# Patient Record
Sex: Male | Born: 1935 | Race: White | Hispanic: No | Marital: Married | Smoking: Never smoker
Health system: Southern US, Community
[De-identification: ages and names within clinical notes are randomized; demographics above are authoritative.]

## PROBLEM LIST (undated history)

## (undated) ENCOUNTER — Ambulatory Visit: Admission: EM | Payer: HMO | Source: Home / Self Care

## (undated) DIAGNOSIS — I10 Essential (primary) hypertension: Secondary | ICD-10-CM

## (undated) DIAGNOSIS — M2550 Pain in unspecified joint: Secondary | ICD-10-CM

## (undated) DIAGNOSIS — F419 Anxiety disorder, unspecified: Secondary | ICD-10-CM

## (undated) DIAGNOSIS — K219 Gastro-esophageal reflux disease without esophagitis: Secondary | ICD-10-CM

## (undated) DIAGNOSIS — K5790 Diverticulosis of intestine, part unspecified, without perforation or abscess without bleeding: Secondary | ICD-10-CM

## (undated) DIAGNOSIS — D369 Benign neoplasm, unspecified site: Secondary | ICD-10-CM

## (undated) DIAGNOSIS — N400 Enlarged prostate without lower urinary tract symptoms: Secondary | ICD-10-CM

## (undated) DIAGNOSIS — I2510 Atherosclerotic heart disease of native coronary artery without angina pectoris: Secondary | ICD-10-CM

## (undated) DIAGNOSIS — T4145XA Adverse effect of unspecified anesthetic, initial encounter: Secondary | ICD-10-CM

## (undated) DIAGNOSIS — E039 Hypothyroidism, unspecified: Secondary | ICD-10-CM

## (undated) DIAGNOSIS — I4891 Unspecified atrial fibrillation: Secondary | ICD-10-CM

## (undated) DIAGNOSIS — D126 Benign neoplasm of colon, unspecified: Secondary | ICD-10-CM

## (undated) DIAGNOSIS — R200 Anesthesia of skin: Secondary | ICD-10-CM

## (undated) DIAGNOSIS — E7800 Pure hypercholesterolemia, unspecified: Secondary | ICD-10-CM

## (undated) DIAGNOSIS — T8859XA Other complications of anesthesia, initial encounter: Secondary | ICD-10-CM

## (undated) DIAGNOSIS — M1990 Unspecified osteoarthritis, unspecified site: Secondary | ICD-10-CM

## (undated) DIAGNOSIS — J189 Pneumonia, unspecified organism: Secondary | ICD-10-CM

## (undated) DIAGNOSIS — M2540 Effusion, unspecified joint: Secondary | ICD-10-CM

## (undated) DIAGNOSIS — G5600 Carpal tunnel syndrome, unspecified upper limb: Secondary | ICD-10-CM

## (undated) DIAGNOSIS — R351 Nocturia: Secondary | ICD-10-CM

## (undated) HISTORY — PX: CORONARY ANGIOPLASTY: SHX604

## (undated) HISTORY — DX: Atherosclerotic heart disease of native coronary artery without angina pectoris: I25.10

## (undated) HISTORY — PX: OTHER SURGICAL HISTORY: SHX169

## (undated) HISTORY — PX: CATARACT EXTRACTION: SUR2

## (undated) HISTORY — DX: Essential (primary) hypertension: I10

## (undated) HISTORY — DX: Diverticulosis of intestine, part unspecified, without perforation or abscess without bleeding: K57.90

## (undated) HISTORY — DX: Pure hypercholesterolemia, unspecified: E78.00

## (undated) HISTORY — DX: Gastro-esophageal reflux disease without esophagitis: K21.9

## (undated) HISTORY — DX: Benign neoplasm of colon, unspecified: D12.6

## (undated) HISTORY — DX: Benign neoplasm, unspecified site: D36.9

## (undated) HISTORY — DX: Benign prostatic hyperplasia without lower urinary tract symptoms: N40.0

## (undated) HISTORY — DX: Hypothyroidism, unspecified: E03.9

## (undated) SURGERY — ECHOCARDIOGRAM, TRANSESOPHAGEAL
Anesthesia: Moderate Sedation

---

## 1999-04-08 DIAGNOSIS — D126 Benign neoplasm of colon, unspecified: Secondary | ICD-10-CM

## 1999-04-08 DIAGNOSIS — D369 Benign neoplasm, unspecified site: Secondary | ICD-10-CM

## 1999-04-08 HISTORY — DX: Benign neoplasm of colon, unspecified: D12.6

## 1999-04-08 HISTORY — DX: Benign neoplasm, unspecified site: D36.9

## 2000-01-15 ENCOUNTER — Encounter (INDEPENDENT_AMBULATORY_CARE_PROVIDER_SITE_OTHER): Payer: Self-pay

## 2000-01-15 ENCOUNTER — Other Ambulatory Visit: Admission: RE | Admit: 2000-01-15 | Discharge: 2000-01-15 | Payer: Self-pay | Admitting: Urology

## 2005-04-07 DIAGNOSIS — K5790 Diverticulosis of intestine, part unspecified, without perforation or abscess without bleeding: Secondary | ICD-10-CM

## 2005-04-07 HISTORY — PX: COLONOSCOPY: SHX174

## 2005-04-07 HISTORY — DX: Diverticulosis of intestine, part unspecified, without perforation or abscess without bleeding: K57.90

## 2005-06-04 ENCOUNTER — Ambulatory Visit (HOSPITAL_COMMUNITY): Admission: RE | Admit: 2005-06-04 | Discharge: 2005-06-04 | Payer: Self-pay | Admitting: *Deleted

## 2005-12-22 ENCOUNTER — Ambulatory Visit: Payer: Self-pay | Admitting: Internal Medicine

## 2005-12-22 ENCOUNTER — Ambulatory Visit (HOSPITAL_COMMUNITY): Admission: RE | Admit: 2005-12-22 | Discharge: 2005-12-22 | Payer: Self-pay | Admitting: Internal Medicine

## 2006-12-24 ENCOUNTER — Ambulatory Visit (HOSPITAL_COMMUNITY): Admission: RE | Admit: 2006-12-24 | Discharge: 2006-12-24 | Payer: Self-pay | Admitting: Ophthalmology

## 2007-01-14 ENCOUNTER — Ambulatory Visit (HOSPITAL_COMMUNITY): Admission: RE | Admit: 2007-01-14 | Discharge: 2007-01-14 | Payer: Self-pay | Admitting: Ophthalmology

## 2007-04-08 DIAGNOSIS — I2510 Atherosclerotic heart disease of native coronary artery without angina pectoris: Secondary | ICD-10-CM

## 2007-04-08 HISTORY — PX: HERNIA REPAIR: SHX51

## 2007-04-08 HISTORY — DX: Atherosclerotic heart disease of native coronary artery without angina pectoris: I25.10

## 2007-06-07 ENCOUNTER — Encounter (INDEPENDENT_AMBULATORY_CARE_PROVIDER_SITE_OTHER): Payer: Self-pay | Admitting: Internal Medicine

## 2007-06-07 ENCOUNTER — Observation Stay (HOSPITAL_COMMUNITY): Admission: EM | Admit: 2007-06-07 | Discharge: 2007-06-08 | Payer: Self-pay | Admitting: Emergency Medicine

## 2007-06-08 ENCOUNTER — Encounter (INDEPENDENT_AMBULATORY_CARE_PROVIDER_SITE_OTHER): Payer: Self-pay | Admitting: *Deleted

## 2007-06-11 ENCOUNTER — Inpatient Hospital Stay (HOSPITAL_COMMUNITY): Admission: EM | Admit: 2007-06-11 | Discharge: 2007-06-13 | Payer: Self-pay | Admitting: Emergency Medicine

## 2007-06-11 HISTORY — PX: CARDIAC CATHETERIZATION: SHX172

## 2007-06-19 ENCOUNTER — Inpatient Hospital Stay (HOSPITAL_COMMUNITY): Admission: EM | Admit: 2007-06-19 | Discharge: 2007-06-22 | Payer: Self-pay | Admitting: Emergency Medicine

## 2007-06-21 HISTORY — PX: CARDIAC CATHETERIZATION: SHX172

## 2007-06-28 ENCOUNTER — Emergency Department (HOSPITAL_COMMUNITY): Admission: EM | Admit: 2007-06-28 | Discharge: 2007-06-28 | Payer: Self-pay | Admitting: Emergency Medicine

## 2007-06-29 ENCOUNTER — Emergency Department (HOSPITAL_COMMUNITY): Admission: EM | Admit: 2007-06-29 | Discharge: 2007-06-29 | Payer: Self-pay | Admitting: Emergency Medicine

## 2007-07-01 ENCOUNTER — Encounter (HOSPITAL_COMMUNITY): Admission: RE | Admit: 2007-07-01 | Discharge: 2007-07-31 | Payer: Self-pay | Admitting: *Deleted

## 2007-07-08 ENCOUNTER — Ambulatory Visit: Payer: Self-pay | Admitting: Internal Medicine

## 2007-08-02 ENCOUNTER — Ambulatory Visit (HOSPITAL_COMMUNITY): Admission: RE | Admit: 2007-08-02 | Discharge: 2007-08-02 | Payer: Self-pay | Admitting: Internal Medicine

## 2007-08-02 ENCOUNTER — Encounter (HOSPITAL_COMMUNITY): Admission: RE | Admit: 2007-08-02 | Discharge: 2007-09-01 | Payer: Self-pay | Admitting: *Deleted

## 2007-08-02 ENCOUNTER — Ambulatory Visit: Payer: Self-pay | Admitting: Internal Medicine

## 2007-08-03 ENCOUNTER — Ambulatory Visit (HOSPITAL_COMMUNITY): Admission: RE | Admit: 2007-08-03 | Discharge: 2007-08-03 | Payer: Self-pay | Admitting: Internal Medicine

## 2007-09-03 ENCOUNTER — Encounter (HOSPITAL_COMMUNITY): Admission: RE | Admit: 2007-09-03 | Discharge: 2007-10-03 | Payer: Self-pay | Admitting: *Deleted

## 2007-10-04 ENCOUNTER — Encounter (HOSPITAL_COMMUNITY): Admission: RE | Admit: 2007-10-04 | Discharge: 2007-11-03 | Payer: Self-pay | Admitting: *Deleted

## 2007-10-11 ENCOUNTER — Ambulatory Visit: Payer: Self-pay | Admitting: Gastroenterology

## 2008-03-20 ENCOUNTER — Encounter: Payer: Self-pay | Admitting: Urgent Care

## 2008-03-20 ENCOUNTER — Ambulatory Visit: Payer: Self-pay | Admitting: Internal Medicine

## 2008-03-20 LAB — CONVERTED CEMR LAB
BUN: 23 mg/dL (ref 6–23)
Basophils Absolute: 0 10*3/uL (ref 0.0–0.1)
Calcium: 8.9 mg/dL (ref 8.4–10.5)
Chloride: 108 meq/L (ref 96–112)
Creatinine, Ser: 1 mg/dL (ref 0.40–1.50)
Glucose, Bld: 87 mg/dL (ref 70–99)
HCT: 40.4 % (ref 39.0–52.0)
Lymphs Abs: 1.9 10*3/uL (ref 0.7–4.0)
Neutrophils Relative %: 57 % (ref 43–77)
Platelets: 192 10*3/uL (ref 150–400)
Potassium: 4.4 meq/L (ref 3.5–5.3)
RBC: 4.37 M/uL (ref 4.22–5.81)
RDW: 13.7 % (ref 11.5–15.5)

## 2008-03-22 ENCOUNTER — Ambulatory Visit (HOSPITAL_COMMUNITY): Admission: RE | Admit: 2008-03-22 | Discharge: 2008-03-22 | Payer: Self-pay | Admitting: Internal Medicine

## 2009-07-13 ENCOUNTER — Ambulatory Visit (HOSPITAL_COMMUNITY): Admission: RE | Admit: 2009-07-13 | Discharge: 2009-07-13 | Payer: Self-pay | Admitting: General Surgery

## 2010-06-26 LAB — BASIC METABOLIC PANEL
BUN: 15 mg/dL (ref 6–23)
Creatinine, Ser: 0.95 mg/dL (ref 0.4–1.5)
GFR calc non Af Amer: >60 mL/min (ref >60)
Sodium: 139 mEq/L (ref 135–145)

## 2010-06-26 LAB — CBC
Platelets: 181 10*3/uL (ref 150–400)
RBC: 4.11 MIL/uL — ABNORMAL LOW (ref 4.22–5.81)
WBC: 5.9 10*3/uL (ref 4.0–10.5)

## 2010-08-20 NOTE — Cardiovascular Report (Signed)
NAMECORRELL, DENBOW NO.:  [id number]   MEDICAL RECORD NO.:  [id number]          PATIENT TYPE:  INP   LOCATION:  6531                         FACILITY:  [facility]   PHYSICIAN:  Madaline Savage, M.D.DATE OF BIRTH:  10/08/1931   DATE OF PROCEDURE:  06/21/2007  DATE OF DISCHARGE:                            CARDIAC CATHETERIZATION   PROCEDURES PERFORMED:  1. Selective coronary angiography by Judkins technique.  2. Retrograde left heart catheterization.  3. Left ventricular angiography.  4. Direct coronary artery stenting of the mid first obtuse marginal      branch of the left circumflex coronary artery.   COMPLICATIONS:  None.   ENTRY SITE:  Right femoral.   DYE USED:  Omnipaque.   MEDICATIONS GIVEN:  Angiograms during coronary intervention which was  discontinued after the case.   PATIENT PROFILE:  The patient is a 77 year old gentleman who had  undergone stenting of his mid left anterior descending coronary artery  and ostium of the diagonal on June 11, 2007.  He was discharged on June 13, 2007.  Since that time, he has felt poorly and lacked energy and had  fatigue and had intermittent chest pain.  It occurs with any activity,  occurs when walking through his house and it occurs while he is watching  TV or at rest.  His activity has been limited because of that and the  patient presented to [facility] again on June 19, 2007 and was  admitted because of his symptoms.  His ejection fraction at that time  was 50%.  Today's procedure was performed after the patient has proven  to have negative cardiac enzymes, and it was performed from the left  percutaneous femoral approach since the recent cath and intervention was  performed from the right femoral approach.   RESULTS:  Pressures:  The left ventricular pressure was 138/40 and  diastolic pressure 12.  Central aortic pressure 130/70 with a mean of  95.  No significant aortic valve gradient by  pullback technique.   ANGIOGRAPHIC RESULTS:  The patient's left main coronary artery was  normal.   The left anterior descending coronary artery courses the cardiac apex  and just after septal perforator branch #2, there is a radio-opaque  stent that is pristine in appearance, beautifully deployed against the  mid-LAD vessel wall.  The distal vessel is free of disease that wraps  around the LAD and the LAD appears pristine as does the stent which it  contains.   The diagonal branch off the LAD underwent balloon angioplasty without  stenting and it appears pristine as well with no sign of an ostial  stenosis, and this vessel appears normal distally.   The circumflex coronary artery is a nondominant vessel with a very large  bifurcating first obtuse marginal branch which courses almost to the  cardiac apex.  It bifurcates just beyond its origin from the proximal  circumflex.  There is a stenosis of what appears to be 75% just after  this obtuse marginal bifurcates into a small limb and a large limb.  The  large limb of the obtuse marginal branch  contains a 75% stenosis in two  views.  The stenosis did not resolve after intracoronary nitroglycerin  200 mcg was delivered.   The ongoing circumflex appears normal.   The right coronary artery shows a 30-40% area of eccentric stenosis in  the distal RCA well before the trifurcation point of a posterior  descending posterolateral and accessory PDA is seen.  This 30-40%  narrowing is not felt to be significant.   Left ventricle shows vigorous contractility of all wall segments.  I  estimated ejection fraction of 50%.   Percutaneous intervention on the circumflex lesion was accomplished  without any complication and was with a satisfactory result.  A 6-French  left Judkins four guide catheter was used.  A Prowater wire was used,  and a no predilatation balloon was necessary.  After another dose of  intracoronary nitroglycerin 200 mcg, I  deployed a Cypher stent 2.5 x 13  mm in the larger limb of the bifurcating obtuse marginal branch and once  I located it strategically, I deployed the stent there to inflation  pressures exceeding 12 atmospheres.  Following deployment of the stents,  I postdilated with a dura star balloon 2.75 x 10 and achieved the  desirable results, and that was a pristine appearance to the OM #1  beyond its smaller branch and there was no encroachment of the stent on  the side branch.  There was TIMI III flow before and after that  procedure.   The patient received Angiomax during the procedure.  The ACT was in  excess of 300 seconds, and we turned off the Angiomax at completion of  the case.   FINAL IMPRESSIONS:  1. Chest pain persist after recent LAD and diagonal intervention on      June 11, 2007.  2. Culprit vessel considered to be obtuse marginal branch #1 just      beyond the bifurcation point and a large limb of the OM.  3. No complications during the procedure.   PLAN:  The patient will be assessed for possible discharge tomorrow and  follow-up with Dr. Jenne Campus and Dr. Domingo Sep.           ______________________________  Madaline Savage, M.D.     WHG/MEDQ  D:  06/21/2007  T:  06/21/2007  Job:  [id number]   cc:   Dani Gobble, MD  Darlin Priestly, MD  [facility]

## 2010-08-20 NOTE — Cardiovascular Report (Signed)
NAMEZORION, NIMS NO.:  [id number]   MEDICAL RECORD NO.:  [id number]          PATIENT TYPE:  INP   LOCATION:  3703                         FACILITY:  [facility]   PHYSICIAN:  Darlin Priestly, MD  DATE OF BIRTH:  [birth year]   DATE OF PROCEDURE:  06/11/2007  DATE OF DISCHARGE:  06/13/2007                            CARDIAC CATHETERIZATION   PROCEDURE:  1. Left heart catheterization.  2. Coronary angiography.  3. Left ventriculogram.  4. Left anterior descending, mid.      a.     Percutaneous transluminal coronary balloon angioplasty.      b.     Placement of intracoronary stent.  5. Diagonal-ostial.  Percutaneous transluminal balloon angioplasty..   COMPLICATIONS:  None.   INDICATIONS:  Mr. Teegarden is a 77 year old male patient of Dr. Kem Boroughs with a history of hypothyroidism, benign prostatic hypertrophy  who recently was admitted to [facility] for chest pain thought  to be secondary to esophageal reflux.  He underwent a stress Cardiolite  suggesting anteroseptal and inferoseptal ischemia with normal EF.  He  was seen by Dr. Domingo Sep on June 11, 2007, with nitroglycerin responsive  angina.  He is now referred for cardiac catheterization to assess  coronary anatomy.   DESCRIPTION OF OPERATION:  After obtaining informed consent, the patient  was to the cardiac catheterization lab.  Right groin was shaved, prepped  and draped in the usual sterile fashion.  ECG monitoring established.  Using modified Seldinger technique, a #6-French arterial sheath was  inserted in the right femoral artery.  A 6-French diagnostic catheter  was used to perform diagnostic angiography.   Left main is a large vessel with no disease.   LAD is a medium to large vessel which is subtotally occluded after the  takeoff of the first diagonal.  There is TIMI-1 flow into the mid and  distal LAD.   There is diffuse 60% disease prior to the subtotal occlusion.   First diagonal is a medium vessel which bifurcates in its distal segment  with no significant disease.   Left circumflex is a medium-size vessel coursing in the AV groove and  gives rise to two obtuse marginal branches.  The AV groove circumflex  has no disease.   First OM is a medium-size vessel which bifurcates in its mid segment  with a 60% lesion in the proximal portion of the OM.   Second OM is a small vessel with no significant disease.   The right coronary artery is a large vessel which is dominant and gives  rise to both a PDA as well as posterolateral branch.  There is a 40%  proximal and 60% distal stenoses.  PDA and posterolateral branch have no  significant disease.   Left ventriculogram reveals a preserved EF of 50%.   HEMODYNAMIC RESULTS:  Systemic arterial pressure 133/65, LV system  pressure 133/6, LVEDP of 14.   INTERVENTIONAL PROCEDURE:  LAD-mid:  Following diagnostic angiography, a  #6-French JL-4 guiding catheter was then coaxially engaged in the left  coronary ostium.  Next, a 0.014 Forte marker wire was advanced down the  guiding catheter and then used to cross the subtotally occluded LAD and  positioned the apical LAD without difficulty.  A second 0.014 high-  torque floppy guidewire was then positioned in the distal first  diagonal.  Over the LAD guidewire, a 2.5 x 15 mm Fire Star balloon was  then used to cross the subtotal occlusion.  Two inflations to a maximum  of 14 atmospheres performed for a total 1 minute 6 seconds.  Followup  angiogram revealed excellent luminal gain.  This balloon was then  removed and a Cypher 3.0 x 23 mm stent was then positioned across the  stenotic lesion. It should be noted this was extended across the  diagonal branch as the LAD appeared to have disease prior to the takeoff  of the diagonal.  This stent was then deployed to maximum of 10  atmospheres for a total of 20 seconds.  A second inflation at 12  atmospheres  performed for total of 20 seconds.  Followup angiogram  revealed excellent luminal gain, though there was some mild haziness in  the distal portion of the stent with TIMI III flow to the distal vessel.  We then attempted to take a second Cypher 2.5 x 13 mm stent into the  midportion of the overlap distal segments so this would not turn beyond  the proximal segment of the vessel.  At this point it was also noted  that the diagonal wire was in the process of working out of the  diagonal.  We were unable to advance the wire as it was trapped behind  the LAD stent.  We ultimately did just pull the wire back into the LAD  and then rewired the diagonal without difficulty.  We then took a  DuraStar 3.0 x 10 mm balloon into the distal portion of the stent, and  four overlapping inflations to maximum of 14 atmospheres performed for a  total of approximately 1 minute 56 seconds.  Followup angiogram revealed  excellent luminal gain with no dissection or thrombus, though there was  noted to be some compromise of the ostial portion of the diagonal.  At  this point, we took the 2.5 x 13 mm Cypher stent and overlapped the  distal segments.  The stent was deployed to 12 atmospheres for a total  of 31 seconds.  A second inflation to 16 atmospheres performed for a  total of 17 seconds.  The balloon was then pulled back over the  overlapping segment and was again inflated to 18 atmospheres for a total  of 15 seconds.  The DuraStar balloon was then placed back into the LAD,  and overlapping inflations through the mid and proximal portion of 2.5  stent to a maximum 12 atmospheres performed for total of approximately  38 seconds.  The DuraStar balloon was then removed, and a Fire Star 2.5  x 15 mm balloon was then tracked into the ostial portion of the  diagonal.  Two inflations at a maximum of 8 atmospheres for a total of  approximately 34 seconds.  Followup angiogram revealed excellent luminal  gain with TIMI  III flow in the LAD and the diagonal.  The DuraStar was  then placed back in the LAD, and two additional inflations to 18  atmospheres performed throughout the mid and proximal LAD stent.  The  balloon was then removed.  Followup angiogram revealed excellent luminal  gain with TIMI III flow in both the diagonal as well as the LAD.  IV  Integralin was used throughout the case.  Intervenous dose of heparin  given to maintain ACT between 200 and 300.   Final orthogonal angiograms revealed less than 10% residual stenosis in  the LAD and diagonal with TIMI III flow to both vessels.  At this point,  we elected to conclude the procedure.  All balloons, wires, and  catheters removed.  Hemostatic sheath was sewn in place, and the patient  was transferred back to the ward in stable condition.   CONCLUSIONS:  1. Successful percutaneous coronary transluminal balloon angioplasty      and placement of overlapping Cypher      stents (2.5 x 13, 3.0 x 23) in the mid LAD stenotic lesion,      ultimately postdilated to 3 mm.  2. Successful percutaneous transluminal coronary balloon angioplasty      to the ostial diagonal.  3 . Adjuvant use of Integrilin infusion.      Darlin Priestly, MD  Electronically Signed     RHM/MEDQ  D:  06/11/2007  T:  06/13/2007  Job:  [id number]   cc:   Dani Gobble, MD  Sam[facility] Jester

## 2010-08-20 NOTE — H&P (Signed)
NAME:  Jerry Cain, CELESTIN NO.:  [id number]   MEDICAL RECORD NO.:  [id number]          PATIENT TYPE:  INP   LOCATION:  4703                         FACILITY:  [facility]   PHYSICIAN:  Dani Gobble, MD       DATE OF BIRTH:  [birth year]   DATE OF ADMISSION:  06/11/2007  DATE OF DISCHARGE:                              HISTORY & PHYSICAL   REFERRING:  Dr. Charm Barges.   INDICATION:  Chest pain and high-risk Myoview.   HISTORY OF PRESENT ILLNESS:  Ms. Ashkar is a very pleasant 77-year-  old gentleman with a past medical history of hypothyroidism and BPH who  was recently discharged from [facility] after an admission for  chest discomfort.  This chest discomfort began within the past one week  or so.  It seemed to occur when he was watching television after he ate  lunch.  It was located to the lower right and left chest.  There was no  radiation, nor did he appear to be short of breath, nauseated, or  diaphoretic.  He did experience palpitations; however, he has had a long  history of palpitations.  The duration of the discomfort was one to two  hours with spontaneous resolution, and he did report that after he  belched it seemed to improve and later resolved entirely after he had a  Pepsi and a snack.   He has had a three- to four-month history of increased belching and  passing gas and felt that his chest discomfort was likely indigestion or  reflux disease.  He is quite active and swims one to two miles daily  without symptoms or difficulty.  He did have recent cataract surgery in  the fall of 2008 and was unable to swim for approximately two months.  Cardiac risk factors are negative for hypertension and diabetes.  His  lipid status is currently unknown.  Heart disease does run in his  family, but he is a nonsmoker.   He ruled out for myocardial infarction by enzymes.  However, he did have  an abnormal EKG with ischemic appearing T-waves in the anterior leads.  His lab work during that hospitalization was reasonably unremarkable.  CBC was essentially normal.  His electrolytes were normal.  Glucose  mildly elevated at 111, BUN 14, creatinine 1.04.  Liver function tests  were normal.  Amylase, lipase normal.  INR 1.0.  Actually, he had an  elevated MB and relative index of 4.9 and 2.8, respectively.  However,  troponin I's were all negative, and as noted above, the ST segments in  the anteroseptal leads were a bit ischemic appearing, and the  possibility of a prior anteroseptal MI cannot be excluded.  Although he  did not carry a diagnosis of hypertension, he was hypertensive  throughout his stay, and I started him on ACE inhibitor, and I started  him on a statin empirically.  I referred him for prompt stress Myoview  which was performed the day after he was discharge.  This came back as a  high-risk scan.  These revealed moderate to severe ischemia in the  anterior septal  and anterior apical walls and the same could be that of  the inferior walls as well.  EF was calculated 51%, and it was felt that  global left systolic function was mildly reduced.   He is here in the office today for follow-up of his high-risk scan.  I  discussed the findings at length.  Initially, I had planned to schedule  him for a cardiac catheterization within the next several days; however,  today he reports he has had intermittent chest discomfort since he left  the hospital which again he thought was indigestion.  Given his high  risk scan and ongoing chest discomfort, I would recommend prompt  hospitalization with cardiac catheterization.  I discussed the logistics  of the procedure as well as risk and benefits.  Risks include but are  limited to bleeding, infection, nerve or vessel damage, emergent  surgery, allergic reactions, arrhythmia, kidney damage, heart attack,  stroke, and death.  He appears amendable to proceed.   ALLERGIES:  TO PENICILLIN.   CURRENT  MEDICATIONS:  He did not bring them today, but putting it  together from my consult note in the hospital, I sent him out on  sublingual nitroglycerin which he has not tried as yet, lisinopril 20  daily, aspirin 81 daily, and he is on a statin, but he again did not  bring his list.   SOCIAL HISTORY:  He is married.  Denies alcohol, tobacco or illicit drug  use.  He swims one to two miles daily.   FAMILY HISTORY:  Notable for brother who had open heart surgery at the  age of 56.   REVIEW OF SYSTEMS:  GENERAL:  Negative other than as noted above.  EYES:  He has had recent cataract surgery.  ENT:  Negative.  RESPIRATORY AND  CARDIOVASCULAR:  As outlined above.  GASTROINTESTINAL:  Negative.  GENITOURINARY:  Negative other than BPH.  MUSCULOSKELETAL:  He has some  arthritis in his knees.  NEUROLOGICAL:  Negative.  PSYCHIATRIC:  Negative.  SKIN:  Negative.  ENDOCRINE:  Negative.   PHYSICAL EXAMINATION:  Reveals a very pleasant, elderly, white male in  no acute distress, alert and oriented x3.  Height is 6 feet tall, weighs  211 pounds.  Blood pressure much improved at 130/80, down from 167  systolic when he was in the hospital.  Heart rate 58.  NECK:  Is negative for JVD at 90 degrees.  I do not appreciate carotid  bruits, lymphadenopathy, or thyromegaly.  LUNGS:  Are clear.  CARDIAC EXAM:  Reveals a regular rate and rhythm.  ABDOMEN:  Soft, nontender, nondistended with positive bowel sounds.  Carotid upstroke is normal.  LOWER EXTREMITIES:  Negative for edema with intact distal pulses which  are equal bilaterally.   EKG performed in the office today does not appear significantly changed  from his prior EKG.  Ischemic-appearing T-waves in V3 and V4.   IMPRESSION:  1. Ongoing chest discomfort.  2. Abnormal EKG suggestive of ischemic-appearing T-waves in the      anteroseptal region.  3. Hypothyroidism on replacement, although he does not show that on      his current medical regimen;  however, he did not bring his      medications in today.  4. Benign prostatic hypertrophy.  5. Prior colonic polyps.  6. Cataract surgery in the fall of 2008.   RECOMMENDATIONS:  1. Admission to [facility] on a rule out MI protocol.  2. Catheterization today if possible  and if not optimize medications      and catheterization him on Monday.  3. Lab work on admission.  4. Chest x-ray from his recent hospitalization revealed mild      cardiomegaly without active lung disease.  5. Serial enzymes.  6. Maintain accurate medical regimen that he is currently.           ______________________________  Dani Gobble, MD     AB/MEDQ  D:  06/11/2007  T:  06/11/2007  Job:  [id number]   cc:   Samuel Jester

## 2010-08-20 NOTE — H&P (Signed)
NAME:  Jerry Cain, AUSBORN NO.:  [id number]   MEDICAL RECORD NO.:  [id number]          PATIENT TYPE:  OBV   LOCATION:  A330                          FACILITY:  [facility]   PHYSICIAN:  Osvaldo Shipper, MD     DATE OF BIRTH:  [birth year]   DATE OF ADMISSION:  06/07/2007  DATE OF DISCHARGE:  LH                              HISTORY & PHYSICAL   PRIMARY CARE DOCTOR:  Samuel Jester.   ADMITTING DIAGNOSES:  1. Chest pain.  2. Palpitations.   CHIEF COMPLAINT:  Chest pain and palpitations since yesterday.   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  The pain was about 3-4/10.  He says that he felt  like he had to burp.  He also admits to having heartburn and a lot of  burping associated with this pain.  Then about an hour later the pain  went away.  And then yesterday he was sitting watching TV when he  experienced similar pain with no fever, no diaphoresis, no shortness of  breath.  Again he felt like he had to burp, then he ate supper and had a  Diet Pepsi and the pain went away.  He denies any fever.  He said he has  been having cough with wheezing, he had this actually a few days ago  with whitish expectoration but none currently.  He says compared to  about a year ago he feels that he is really short of breath.  He admits  to having a stress test about 10 years ago for palpitations which was  negative.  This morning when he woke up he checked his pulse and felt  like he was having skipped beats.  He did not have any more episodes of  chest pain and because of all of this that has been happening for the  past 10 days he decided to come in to the hospital to get  himself  examined.   MEDICATIONS AT HOME:  He is on a medication for prostate and a  medication for thyroid and he is also on Metamucil.  He does not know  the names of his medications.   ALLERGIES:  PENICILLIN.   PAST MEDICAL HISTORY:  1. Hypothyroidism.  2. Possible BPH.  3. He has had colonic polyps in the past.  4. He denies any other surgeries.  5. He has had broken ribs and broken vertebrae as a result of injury      but no other surgeries and no other medical problems.   SOCIAL HISTORY:  1. Lives about 6 miles from [place] with his wife.  2. He states he swims on a daily basis 1-2 miles and does not      experience any kind of chest pain with this activity.  3. No smoking  use, alcohol use or illicit drug use.   FAMILY HISTORY:  He had a brother who had open heart surgery at the age  of 38.   REVIEW OF SYSTEMS:  The 10-point review of systems was negative except  as noted in the HPI.   PHYSICAL EXAMINATION:  VITAL SIGNS:  Temperature 97.5, blood pressure  when he came was 157/79, going up to 176/80 and then subsequently  dropping down to 155/70s, respiratory rate 18, saturations 98% on room  air.  GENERAL EXAM:  This is a well-developed, well-nourished, male in no  distress.  HEENT:  There is no pallor, no icterus.  Oral mucous membrane is moist,  no oral lesions are noted.  NECK:  Soft and supple, no thyromegaly is appreciated.  LUNGS:  Clear to auscultation bilaterally.  CARDIOVASCULAR:  S1 S2 is normal, regular, no murmurs appreciated, no S3  S4, no bruits are heard.  ABDOMEN:  Soft, nontender, nondistended, bowel sounds are present, no  mass or organomegaly is appreciated.  EXTREMITIES:  Show no edema, peripheral pulses are palpable.  NEUROLOGIC:  Patient is alert, oriented x3, no focal neurological  deficits are present.   LAB DATA:  His CBC is unremarkable as is his BMET.  First set of cardiac  markers are negative.  He had an EKG done which showed a  sinus rhythm  with possibly mild left axis deviation, intervals maybe, mild first  degree AV block.  Other intervals are within normal range.  He has  possible Q wave in lead 3 though very difficult to say.  He has some  biphasic T waves but no other acute ST-T changes are noted.  He had a  chest x-ray which showed mild cardiac enlargement, otherwise no other  acute abnormalities appreciated.   ASSESSMENT:  This is a 77 year old white male who has a history of  possibly hypothyroidism and benign prostatic hypertrophy who presents  with atypical chest pain and skipped beats.  This is an individual who  swims up to 2 miles every day and he does that in a 2 hour period  without taking any breaks.  It is quite unlikely that this patient has  coronary artery disease.  I suspect this patient has acid reflux  disease.  Some of his other symptoms could suggest gallbladder disease  as well.  Dr. Neale Burly discussed this case with Dr. Daleen Squibb, [facility]  cardiologist, who recommended admission here.   PLAN:  Chest pain, palpitations.  We will observe him in the hospital  and rule him out for acute coronary syndrome, repeat electrocardiograms,  check a lipid profile, check his TSH and free T4, make sure that is not  what is causing his symptoms.  Patient will be seen tomorrow morning by  [facility] and further disposition can be planned.  A 2D  echocardiogram will be obtained because he has got cardiomegaly on chest  x-ray.   This patient is a low risk for coronary artery disease at this point.   I anticipate patient going home by tomorrow evening.      Osvaldo Shipper, MD  Electronically Signed     GK/MEDQ  D:  06/07/2007  T:  06/07/2007  Job:  [id number]   cc:   Samuel Jester  Fax: [phone]

## 2010-08-20 NOTE — Assessment & Plan Note (Signed)
NAME:  Jerry Cain, Jerry Cain           CHART#:  [id number]   DATE:  10/11/2007                       DOB:  [birth year]   CHIEF COMPLAINT:  Followup GERD/atypical chest pain.   PROBLEM LIST:  1. Atypical chest pain, refractory heartburn and indigestion.      Underwent EGD, August 02, 2007, by Dr. Jena Gauss.  He was found to have      a normal exam.  He did respond symptomatically to b.i.d. omeprazole      initially.  2. Hypertension.  3. Hypercholesterolemia.  4. Hypothyroidism.  5. Gout.  6. Benign prostatic hypertrophy.  7. Coronary artery disease, status post cardiac catheterization and      percutaneous transluminal coronary angioplasty by Dr. Shirlee Latch on      June 21, 2007.  8. Bilateral cataract surgery.  9. Colonoscopy on December 22, 2005.  10.Extensive left-sided diverticulosis.  11.Tubular adenoma, colonoscopy in 2001.  12.He had a normal upper abdominal ultrasound on August 03, 2007.   SUBJECTIVE:  The patient is a 77 year old Caucasian male who recently  underwent EGD as described above by Dr. Jena Gauss.  He has intentionally  lost 16 pounds and is on a cardiac diet and doing very well.  He does  complain of some postprandial bloating, especially when he eats foods,  such as pintos, turnips, and drinks significant amount of tea.  He has  some intermittent left lower quadrant pain and bloating, as well as  increased belching.  He denies any heartburn or indigestion at this  time.  He is not having any constipation, diarrhea, or blood in his  stools.  He is on omeprazole 20 mg b.i.d., and does not see a difference  in his symptoms.   CURRENT MEDICATIONS:  See the list from October 11, 2007.   ALLERGIES:  Penicillin.   OBJECTIVE:  VITAL SIGNS:  Weight 190 pounds, height 72 inches,  temperature 97.9, blood pressure 124/68, and pulse 60.  GENERAL:  He is a well-developed, well-nourished elderly Caucasian male,  in no acute distress.  HEENT:  Sclerae clear.  Nonicteric.  Conjunctivae  pink.  Oropharynx pink  and moist without any lesions.  HEART:  Regular rate and rhythm.  Normal S1 and S2.  ABDOMEN:  Positive bowel sounds x4.  No bruits auscultated.  Soft and  nontender.  Mildly distended without palpable mass or  hepatosplenomegaly.  No rebound, tenderness, or guarding.  EXTREMITIES:  Without clubbing or edema.   ASSESSMENT:  The patient is a 77 year old Caucasian male with  gastroesophageal reflux disease, well controlled on proton pump  inhibitor except for increased belching and abdominal bloating.  He does  eat a significant amount of gas producing foods.  He has had an  intentional 16-pound weight loss, which would otherwise be concerning.   PLAN:  1. Gas literature and GERD literature given for his review.  2. He was instructed to avoid gas producing foods.  3. Discontinue omeprazole and change to Aciphex 20 mg daily.  I have      given him 4 boxes of samples as well as a prescription for 31 with      5 refills.  4. Trial of Beano.  We have given him samples.  5. Six months with Dr. Jena Gauss or sooner if gas/bloating continues, he      may need further work-up.  Lorenza Burton, N.P.  Electronically Signed     Kassie Mends, M.D.  Electronically Signed    KJ/MEDQ  D:  10/11/2007  T:  10/12/2007  Job:  [id number]   cc:   Samuel Jester

## 2010-08-20 NOTE — Consult Note (Signed)
NAME:  BAUER, AUSBORN NO.:  [id number]   MEDICAL RECORD NO.:  [id number]          PATIENT TYPE:  OBV   LOCATION:  A330                          FACILITY:  [facility]   PHYSICIAN:  Dani Gobble, MD       DATE OF BIRTH:  [birth year]   DATE OF CONSULTATION:  DATE OF DISCHARGE:                                 CONSULTATION   PRIMARY CARE PHYSICIAN:  Samuel Jester.   CHIEF COMPLAINT:  His chest pain.   HISTORY OF PRESENT ILLNESS:  Mr. Getter is a very pleasant 77-year-  old gentleman with a past medical history of hypothyroidism and BPH, who  was referred for chest discomfort.  He reports the chest discomfort  began this past weekend.  He was watching TV after he had lunch was on  Sunday, and he began to experience chest discomfort which she locates to  the lower left and right chest.  There was no radiation with this  discomfort.  He did not appear to be excessively with shortness of  breath, nausea, vomiting or diaphoresis.  He did experience some  palpitations.  However, he has had a long history of palpitations.  This  duration was approximately 1 to 2 hours.  It resolved spontaneously.  He  did report that after he belched, it seemed to improve and later  resolved after he had a Pepsi and a snack.   He reports he has had a 3 to 24-month history of increased belching and  passing gas.  He denies specifically chest discomfort associated with  this, but he does point to his chest, referring to some sensation along  with the belching which resolved.   He reports he had palpitations and had a stress test for this  approximately 10 years ago, remotely.  He reports occasional  palpitations, particularly if he checks his pulse, but they are entirely  asymptomatic and describes them as skipping beats.  He denies  shortness of breath or dyspnea on exertion.  He denies dizziness, pre-  syncope, or syncope.  He denies lower extremity edema, PND or orthopnea.  He does snore  but has never had a history of apneic episodes, nor does  he give a history of early a.m. headaches or daytime somnolence.   He reports that he swims approximately one to two miles a day.  He had  cataract surgery in the fall of this year and was unable to swim for not  quite two months and it did take him a while to get back up to his  fitness level, but other than that there had been no change in activity  tolerance.  His cardiac risk factors are negative for hypertension,  diabetes.  His lipid status is unknown.  Heart disease does run in his  family.  He is a nonsmoker.   PAST MEDICAL HISTORY:  1. Hypothyroidism, on replacement.  2. What sounds like benign prostatic hypertrophy.  3. Prior colonic polyps.  4. Cataract surgery in the recent past.   ALLERGIES:  TO PENICILLIN REMOTELY.   MEDICATIONS:  Medication he takes:  I believe Proscar, but I do  not know  the dose that he takes thyroid replacement.  He also reports being on  Metamucil.   SOCIAL HISTORY:  He is married.  He swims one to two miles daily,  without any discomfort or difficulty.  He denies tobacco, alcohol, or  illicit drug use.   FAMILY HISTORY:  Is notable for brother who had open heart surgery at  the age of 59.   REVIEW OF SYSTEMS:  GENERAL:  Negative that other than as noted above.  EYES:  Notable for recent cataract surgery.  ENT:  Negative.  RESPIRATORY/CARDIOVASCULAR:  As outlined above.  GI:  Negative.  GU:  Negative other than possible BPH.  MUSCULOSKELETAL:  He does have some  arthritis in his knees, which limits his walking but not his swimming.  NEURO:  Negative.  PSYCH:  Negative.  SKIN:  Negative.  ENDOCRINE:  Negative.   PHYSICAL EXAM:  GENERAL:  Reveals a very pleasant, elderly white male in  no acute distress, alert and oriented x3.  HEENT:  Normocephalic and atraumatic.  VITAL SIGNS:  Blood pressure admission 167/79, and a repeat 158/81,  pulse 56 and a repeat at 61, respirations 20.  He  is afebrile.  He is  98% on room air.  NECK:  Notable for JVP at approximately 8-10 cm of water, when lying  supine.  I do not appreciate carotid bruits, lymphadenopathy, or  thyromegaly.  LUNGS:  Entirely clear throughout.  CARDIAC:  Exam reveals regular rate and rhythm.  I do not appreciate an  S3.  The PMI does not appear to be displaced.  There is no obvious heave  or lift.  NECK:  Carotid upstroke is normal, 2+ bilateral without delay.  ABDOMEN:  The abdomen is soft, nontender, nondistended with positive  bowel sounds in all 4 quadrants.  No hepatosplenomegaly, no masses, no  bruits are detected.  The lower extremities are negative for edema and  distal pulses 2+ and equal bilaterally.   LAB WORK:  Admission CBC:  White count 6.0, hematocrit 38.7, platelets  207,000 without left shift.  Differential is normal.  Sodium 135,  potassium 3.6, chloride 105, bicarb 26, glucose 111, BUN 14, creatinine  1.04, calcium 8.6.  Liver function tests:  Total bilirubin 0.7, alk-phos  48, AST 29, ALT 28, total protein 6.2, albumin 3.6, amylase 52, lipase  19, INR 1.0.  Initial cardiac markers were entirely negative.  Second  set of enzymes:  Negative CK  at 173 with a MB of 4.9 and relative index  of 2.8, both of  which are minimally elevated.  Troponin I at that time  was negative at 0.04.  The next two sets of enzymes are negative.  Chest  x-ray is not currently available.  EKG reveals sinus rhythm at 62.  I  cannot exclude the possibility of a prior anteroseptal myocardial  infarction.  Indeed, the ST segments appear a bit ischemic.   IMPRESSION:  1. Chest discomfort, atypical, suggestive of reflux disease; however,      he does have an abnormal EKG, and his risk factors appear to be at      least markedly positive for hypertension and for family history.  2. Hypothyroidism on replacement.  3. Probable benign prostatic hypertrophy.   RECOMMENDATIONS:  1. Check an Helicobacter pylori.  2.  Start lisinopril 20 daily for hypertension.  3. Add statin empirically and check a fasting lipid profile, if it not      already done.  4.  Check an echocardiogram.  If the echo was normal, will recommend      discharge with prompt stress test in 1 to 2 days, and if the echo      is abnormal, will refer him to [facility] for cardiac catheterization for      definitive delineation of his coronary tree.  5. No beta blocker, due to baseline heart rate in the 50s and 60s.  6. Will discharge him with sublingual nitroglycerin with obstruction.  7. Aspirin 91 daily.  8. Fasting lipid profile.           ______________________________  Dani Gobble, MD     AB/MEDQ  D:  06/08/2007  T:  06/08/2007  Job:  [id number]   cc:   Osvaldo Shipper, MD   Samuel Jester  Fax: [phone]

## 2010-08-20 NOTE — Consult Note (Signed)
NAME:  Jerry Cain, Jerry Cain          ACCOUNT NO.:  [id number]   MEDICAL RECORD NO.:  [id number]          PATIENT TYPE:  AMB   LOCATION:  DAY                           FACILITY:  [facility]   PHYSICIAN:  R. Roetta Sessions, M.D. DATE OF BIRTH:  08/01/1931   DATE OF CONSULTATION:  07/08/2007  DATE OF DISCHARGE:                                 CONSULTATION   CHIEF COMPLAINT:  Refractory gastroesophageal reflux disease/dysphagia.   HISTORY OF PRESENT ILLNESS:  Jerry Cain is a 77 year old Caucasian  male.  He began to have sensation of dysphagia where he tells me he felt  like spinach became stuck in his upper esophagus and points to his  throat as well.  A couple of days later, he had a similar sensation with  a small piece of candy.  He describes a tightening sensation in his neck  and upper esophagus.  This happened just days after being released from  the hospital for a stent placement for heart disease.  He denies any  problems with dysphagia prior to this.  Denies any problems with  liquids.  Denies any episodes of choking spells.  Denies any  odynophagia.  He does note approximately 1 month ago, he developed  severe heartburn and indigestion, especially every time he ate anything.  He complained of chest pain and increased belching.  This was when he  was seen and evaluated at the hospital.  He was found to have  significant coronary artery disease which required stent placement on  June 11, 2007 followed by a second stent placement on June 21, 2007 by  Dr. Jenne Campus.  Around the same time, he was started on omeprazole 20 mg  daily.  Prior to this, he was not on a PPI.  He has not noticed much  difference on the omeprazole.  He does notice his symptoms are worse  with chocolate, tea and apples.  At times, he has some epigastric pain  as well.  He denies any rectal bleeding or melena.  Denies any  constipation or diarrhea.   PAST MEDICAL/SURGICAL HISTORY:  1. Hypertension.  2.  Hypercholesterolemia.  3. Hypothyroidism.  4. Gout.  5. BPH.  6. He has coronary artery disease status post a cardiac      catheterization and PTCA by Dr. Jenne Campus on June 11, 2007 and June 21, 2007.  7. He has had bilateral cataract surgery.  8. He had a colonoscopy by Dr. Karilyn Cota on December 22, 2005 which      showed extensive left-sided diverticulosis.  9. He has a history of tubular adenoma on colonoscopy in 2001.   CURRENT MEDICATIONS:  1. Aspirin 325 mg daily.  2. Plavix 75 mg daily.  3. Lisinopril 20 mg daily.  4. Simvastatin 20 mg q.h.s.  5. Omeprazole 20 mg daily.  6. Levothyroxine 88 mcg daily.  7. Proscar 5 mg daily.  8. Metoprolol 12.5 mg b.i.d.  9. Colchicine 0.6 mg p.r.n.  10.Imdur 30 mg daily.  11.Nitroglycerin p.r.n.  12.Metamucil daily.  13.Finasteride 5 mg daily.  14.Levoxyl 88 mcg daily.   ALLERGIES:  PENICILLIN.  FAMILY HISTORY:  There is no known family history of colon carcinoma or  chronic GI problems.  Mother deceased at 19 due to respiratory failure.  Father deceased at 20 secondary to pneumonia.  He had 3 siblings with  history significant for coronary artery disease and MI.   SOCIAL HISTORY:  He has been married for 50 years.  He has 4 healthy  children.  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  He  denies any tobacco, alcohol or drug use.   REVIEW OF SYSTEMS:  See HPI, otherwise negative.   PHYSICAL EXAMINATION:  VITAL SIGNS:  Weight 208 pounds, height 72  inches, temperature 97.9, blood pressure 102/66 and pulse 60.  GENERAL:  Jerry Cain is an elderly Caucasian male who is alert,  oriented, pleasant, cooperative in no acute distress.  HEENT.  Sclerae are clear, nonicteric.  Conjunctivae are pink.  Oropharynx pink and moist without any lesions.  NECK:  Supple without mass or thyromegaly.  CHEST/HEART:  Regular rate and rhythm.  Normal S1-S2 without any  murmurs, clicks, rubs or gallops.  LUNGS:  Clear to auscultation  bilaterally.  ABDOMEN:  Protuberant with positive bowel sounds x4.  No bruits  auscultated.  Soft, nontender, nondistended without palpable mass or  hepatosplenomegaly.  No rebound tenderness or guarding.  Exam is limited  given the patient's body habitus.  EXTREMITIES:  Without clubbing or edema bilaterally.   IMPRESSION:  Jerry Cain is a 77 year old male with refractory  heartburn and indigestion every time he eats along with significant  chest pain which did lead to cardiac workup with significant disease  found on catheterization and subsequent stent placement both a month as  well as almost 2 weeks ago.  He is on aspirin and Plavix now.  Given the  severity of his symptoms, we absolutely need to rule out esophageal  carcinoma as well as complicated gastroesophageal reflux disease  including the development of Barrett's esophagus, erosive reflux  esophagitis, esophageal web ring or stricture.   PLAN:  1. Continue omeprazole 20 mg daily.  2. EGD on aspirin and Plavix.  This has been discussed with Dr. Jena Gauss      and he is in agreement with this plan.   I have discussed risks and benefits.  I have discussed an increased risk  of bleeding, infection, perforation or drug reaction.  He agrees with  the plan and consent will be obtained.   Thank you Dr. Charm Barges for allowing [place] to participate in the care of Mr.  Cain.      Lorenza Burton, N.P.      Jonathon Bellows, M.D.  Electronically Signed    KJ/MEDQ  D:  07/09/2007  T:  07/09/2007  Job:  [id number]   cc:   Samuel Jester  Fax: [phone]

## 2010-08-20 NOTE — Assessment & Plan Note (Signed)
NAME:  Jerry Cain, Jerry Cain           CHART#:  [id number]   DATE:  03/20/2008                       DOB:  05/16/1931   PRIMARY CARE PHYSICIAN:  Dr. Charm Barges.   CHIEF COMPLAINT:  Followup GERD, new problem left lower quadrant pain.   PROBLEM LIST:  1. New problem of left lower quadrant pain, possible hernia.  2. Nonerosive reflux disease.  3. Hypertension.  4. Hypercholesterolemia.  5. Hypothyroidism.  6. Gout.  7. Benign prostatic hypertrophy.  8. Coronary artery disease, status post cardiac cath and percutaneous      transluminal coronary angioplasty by Dr. Shirlee Latch on June 21, 2007.  9. Bilateral cataract surgery.  10.Colonoscopy on December 22, 2005, with extensive left-sided      diverticulosis.  11.History of tubular adenoma and colonoscopy in 2001.  12.Normal upper abdominal ultrasound on August 03, 2007.   SUBJECTIVE:  The patient is a 77 year old Caucasian male.  He tells me  his NERD is well controlled at the time.  He is not on PPI.  He denies  any chest pain, heartburn, and ingestion.  He does have some abdominal  bloating.  He denies any abdominal pain.  He is passing a lot of flatus.  He has been on a low-sodium and low-fat diet.  He is complaining of a  left lower quadrant pain that radiates down to his groin.  He feels a  bulge in his groin.  He has pain in his left testicle and that worsens  with climax during sexual intercourse.  He is having usually 2 bowel  movements per morning.  He denies any diarrhea, constipation, rectal  bleeding, or melena.  He is taking Metamucil on a daily basis.  He  denies any fever and has had chills at times.  His appetite is good.  His weight has remained stable.  He has had some problems with  hypertension with blood pressures in the 170s-180 systolic.   CURRENT MEDICATIONS:  See updated list from March 20, 2008.   ALLERGIES:  Penicillin and Aciphex.   OBJECTIVE:  VITAL SIGNS:  Weight 190 pounds, height 72 inches,  temperature 97.4, blood pressure 112/78, and pulse 60.  GENERAL:  He is a well-developed and well-nourished elderly Caucasian  male, in no acute distress.  HEENT:  Sclerae clear, nonicteric.  Conjunctivae pink.  Oropharynx pink  and moist without any lesions.  CHEST:  Heart regular rate and rhythm.  Normal S1 and S2.  ABDOMEN:  Positive bowel sounds x4.  No bruits auscultated.  He does  have a bulge to his left groin.  It is easily reducible, somewhat  tender, and does not appear incarcerated.  There is no rebound,  tenderness, or guarding.  No hepatosplenomegaly.  EXTREMITIES:  Without  clubbing or edema.   ASSESSMENT:  The patient is a 77 year old Caucasian male with abdominal  bloating and increased flatulence.  He does consume a significant amount  of gas-producing foods, which could be the culprit.  His weight has  remained stable.  He is having some left lower quadrant pain and I  suspect, he may have a left inguinal hernia.  He also has history of  diverticulosis and adenomatous colon polyp, doubt this is  diverticulitis.   PLAN:  1. He is going to follow with his primary care Kailany Dinunzio regarding his      hypertension.  2. Gas bloat literature.  3. Probiotic of choice.  I have suggested Sustenex, Align, or Flora-Q.  4. CT of the pelvis with IV and oral contrast to further delineate      left lower quadrant fullness and rule out hernia.       Lorenza Burton, N.P.  Electronically Signed     R. Roetta Sessions, M.D.  Electronically Signed    KJ/MEDQ  D:  03/20/2008  T:  03/20/2008  Job:  [id number]   cc:   Dr. Charm Barges

## 2010-08-20 NOTE — Op Note (Signed)
NAME:  Jerry Cain, Jerry Cain          ACCOUNT NO.:  [id number]   MEDICAL RECORD NO.:  [id number]          PATIENT TYPE:  AMB   LOCATION:  DAY                           FACILITY:  [facility]   PHYSICIAN:  R. Roetta Sessions, M.D. DATE OF BIRTH:  1931-11-05   DATE OF PROCEDURE:  08/02/2007  DATE OF DISCHARGE:                               OPERATIVE REPORT   INDICATIONS FOR PROCEDURE:  A 77 year old gentleman with apparent  refractory heartburn, and indigestion.  He has had some atypical chest  pain for which cardiac workup turned out negative.  He has been on  aspirin and Plavix.  He tells me since being on omeprazole 20 mg orally  twice daily before breakfast and before supper, the above-mentioned  symptoms have settled down significantly.  He was also having some  dysphagia, but that symptom too has settled down.  EGD is now being  done.  Risks, benefits, alternatives, and limitations have been  reviewed, questions answered.  He is agreeable.  Please see  documentation in the medical record.   PROCEDURE NOTE:  O2 saturation, blood pressure, pulse, and respirations  were monitored throughout the entire procedure.   CONSCIOUS SEDATION:  Versed 3 mg IV, Demerol 75 mg IV in divided doses.   INSTRUMENT:  Pentax video chip system.   FINDINGS:  Examination of the tubular esophagus revealed normal-  appearing mucosa.  The tubular esophagus was widely patent down through  the EG junction.  Stomach:  Gastric cavity was emptied and insufflated  well with air.  A thorough examination of the gastric mucosa including  retroflex view of the proximal stomach and esophagogastric junction  demonstrated no mucosal abnormalities.  There was no hernia.  Pylorus  was patent and easily traversed.  Examination of the bulb and second  portion revealed no abnormalities.  Therapeutic/diagnostic maneuvers  performed:  None.   The patient tolerated the procedure well and was reactive to endoscopy.   IMPRESSION:   Normal esophagus, stomach, D1 and D2.  In addition to  b.i.d. omeprazole, Mr. Westberg tells me he started taking his  simvastatin at bedtime instead of earlier in the day, and with this  maneuver and increasing omeprazole to b.i.d., he is overall doing much  better.   RECOMMENDATIONS:  Continue omeprazole 20 mg orally twice daily and plan  to see him back in 3 months.  If he has any recurrence in symptoms, we  would consider possibility of occult gallbladder disease, and would  evaluate further.      Jonathon Bellows, M.D.  Electronically Signed     RMR/MEDQ  D:  08/02/2007  T:  08/02/2007  Job:  [id number]   cc:   Samuel Jester  Fax: [phone]   Delman Cheadle, MD  Fax: [phone]

## 2010-08-20 NOTE — Discharge Summary (Signed)
NAME:  IZSAK, MEIR NO.:  [id number]   MEDICAL RECORD NO.:  [id number]          PATIENT TYPE:  INP   LOCATION:  3703                         FACILITY:  [facility]   PHYSICIAN:  Darlin Priestly, MD  DATE OF BIRTH:  [birth year]   DATE OF ADMISSION:  06/11/2007  DATE OF DISCHARGE:  06/13/2007                               DISCHARGE SUMMARY   HISTORY OF PRESENT ILLNESS:  Mr. Peel is a 77 year old male  patient who was admitted by Dr. Domingo Sep secondary to high risk Myoview  scan.  He had moderate to severe ischemia in the anterior septal and  anterior apical walls and inferior walls as well.  His EF was 51%.  He  was admitted for IV heparin, nitroglycerin and a cardiac  catheterization.  He underwent cath on June 11, 2007.  It revealed a 99%  mid LAD.  He underwent PTCA and stenting with a Cypher stent 3.0 x 23.  He also had the diagonal ostial PTCA reduced from 90% to less than 10%.  He did have other disease in his RCA 40% and 60%  and a 60% OM-1.  He  was going to be discharged on the morning of June 12, 2007.  However, he  walked in the hall after he got back to his room, he had some chest pain  so he was kept overnight.  He apparently had recurrent chest pain after  that.  He was started on Lovenox and nitroglycerin.  He was seen by Dr.  Jenne Campus in the morning of June 13, 2007.  He discontinued his IV  nitroglycerin, discontinued his Lovenox.  The patient walked in the hall  again and he did well.  His troponins did elevate mildly post procedure  to 0.12, 0.15 and 0.13 respectively.  His CK-MBs were negative.   LABORATORY DATA:  Hemoglobin 12.9, hematocrit 38, WBCs 7.4, platelets  186,000.  Sodium 139, potassium 3.5, BUN 12, creatinine 1 and glucose  was 159.  He had a TSH drawn.  However, this was after his cath which is  probably not accurate because of the iodine based dye given.  TSH was  7.832 on June 12, 2007.  His hemoglobin A1c was 5.9.  Lipid profile  was  total cholesterol is 129, triglycerides 159, HDL was 20 and LDL was 77.  CK-MBs 131/4.3 and troponin of 0.02 on March 6. On March 7, his CK-MB  73/2.9, troponin 0.08, then 92/3.4, troponin of 0.13, then 117/3.7,  troponin of 0.15, and 91/2.7, troponin of 0.12.   DISCHARGE MEDICATIONS:  1. Simvastatin 20 mg at bedtime.  2. Lisinopril 20 mg a day.  3. Plavix 75 mg a day.  He is not to stop it.  4. Pepcid 20 mg a day.  5. Aspirin 325 mg every day.  6. Levothyroxine 88 mcg per day.  7. Proscar 5 mg a day.  8. Isosorbide mononitrate 30 mg a day.  9. Metamucil daily.  10.Metoprolol 37.5 twice daily.  11.Nitroglycerin 150 under tongue every 5 minutes x3 for chest pain      when needed.   If any problems with his  groin, he should call our office and he should  do no pushing, pulling, lifting, or extended walking for 5 days.   DIET:  He should be on a low sodium, heart healthy diet.   FOLLOW UP:  He will follow up Dr. Domingo Sep in 1-2 weeks.  He already has  an appointment on March 18.   DISCHARGE DIAGNOSIS:  1. Unstable angina.  2. Coronary artery disease, status post catheterization with a 99% mid      LAD with subsequent Cypher stenting, 3.0 x 23 and percutaneous      transluminal coronary angioplasty to his diagonal. Positive      residual disease in his RCA and OM-1.  3. Ejection fraction of 50%.  4. Hyperlipidemia with low HDL.  5. Hypothyroidism.  6. Hypertension.  7. Gastroesophageal reflux disease.  8. Benign prostatic hypertrophy.      Lezlie Octave, N.P.      Darlin Priestly, MD  Electronically Signed    BB/MEDQ  D:  06/13/2007  T:  06/14/2007  Job:  [id number]   cc:   Sharia Reeve, MD

## 2010-08-20 NOTE — Discharge Summary (Signed)
NAME:  Jerry Cain, Jerry Cain NO.:  [id number]   MEDICAL RECORD NO.:  [id number]          PATIENT TYPE:  OBV   LOCATION:  A330                          FACILITY:  [facility]   PHYSICIAN:  Osvaldo Shipper, MD     DATE OF BIRTH:  July 03, 1931   DATE OF ADMISSION:  06/07/2007  DATE OF DISCHARGE:  03/03/2009LH                               DISCHARGE SUMMARY   PRIMARY MEDICAL DOCTOR:  Samuel Jester, MD   The patient was seen during this hospital stay by [facility]iology.   DISCHARGE DIAGNOSES:  1. Chest pain, ruled out for acute coronary syndrome.  2. Palpitations.  3. Hypertension, newly diagnosed.  4. History of hypothyroidism.  5. Enlarged prostate.   Please review the H&P dictated yesterday.   BRIEF HOSPITAL COURSE:  This is a 77 year old fairly healthy Caucasian  male, who presented after experiencing on and off chest pain for the  past 10 days.  He also had some palpitations yesterday morning.  The  patient was admitted for further evaluation.  His electrolytes were  fine.  He has ruled out acute coronary syndrome.  His EKG showed  nonspecific changes.  He was seen by Dr. Domingo Sep this morning, who  recommend an echocardiogram, and she thinks that if his EF is okay he  can be further worked up as  an outpatient.  If, on the other hand, if  the EF is low he may need cardiac catheterization.  So, she will decide  the further disposition of this patient.  He has been in the meantime  started on ACE inhibitor and statin.   This morning the patient is feeling well.  He does not have any  complaints whatsoever; no chest pain, no shortness of breath or  palpitations.  His vital signs show that his blood pressure is still  elevated 185/87 this morning.  He is afebrile.  Heart rate is in the 50s  and 60s, saturating 98% on room air.  His examination was completely  benign.   So, the patient, as far as I am concerned, is stable medically at this  point.  Further  disposition to be decided by Dr. Domingo Sep.   DISCHARGE MEDICATIONS:  1. Levoxyl 88 mcg daily.  2. Proscar 5 mg daily.  3. Multivitamin one tablet daily.  4. Glucosamine daily.  5. Calcium tablets daily.  6. He takes mangosteen chews 3 times a day.   NEW MEDICATIONS STARTED:  1. Simvastatin 20 mg at bedtime daily.  2. Lisinopril 20 mg once daily.  3. Aspirin 81 mg once daily.   FOLLOW-UP:  Per Dr. Domingo Sep.   DIET:  Heart healthy.   PHYSICAL ACTIVITY:  As far as I am concerned, no restrictions.   IMPRESSION:  The patient is to be discharged, pending an echocardiogram  and evaluation of the ejection fraction.   Total Discharge time:      Osvaldo Shipper, MD  Electronically Signed     GK/MEDQ  D:  06/08/2007  T:  06/08/2007  Job:  [id number]   cc:   Dani Gobble, MD  Fax: [phone]   Samuel Jester  Fax: [phone]

## 2010-08-20 NOTE — Discharge Summary (Signed)
NAME:  EDDI, HYMES NO.:  [id number]   MEDICAL RECORD NO.:  [id number]          PATIENT TYPE:  INP   LOCATION:  6531                         FACILITY:  [facility]   PHYSICIAN:  Madaline Savage, M.D.DATE OF BIRTH:  June 05, 1931   DATE OF ADMISSION:  06/19/2007  DATE OF DISCHARGE:  06/22/2007                               DISCHARGE SUMMARY   HISTORY OF PRESENT ILLNESS:  Mr. Kerwin is a 77 year old white male  patient with a history of coronary artery disease.  He had a PCI stent  to his mid LAD and PTCA stent to his diagonal on June 11, 2007.  He was  discharged home on June 13, 2007.  Since that time, he apparently has  felt poorly.  He had decreased energy and fatigue with intermittent  chest pain.  He had chest pain with any physical activity, it occurred  walking through his house.  He has also had chest pain at rest.  He did  have residual disease in his circumflex.  So, it was decided to keep him  in the hospital and for him to undergo recatheterization.  He was  negative for an MI.  He was placed on IV heparin.  He underwent  recatheterization by Dr. Elsie Lincoln on June 21, 2007.  He was found to have  60% circumflex lesion.  He underwent Cypher stenting 2.5 x 13.  His LAD  and diagonal stents were without any in-stent restenosis.  The following  day, his pressures were 118/44, his heart rate was 61, respirations 17,  temperature 97.5, his O2 sats were 95% on room air.  He was considered  stable for discharge home.  He did experience some gout, and he had  initially been put on Indocin; however, on the day of discharge, this  was changed and he was placed on colchicine.  His TSH level was also  elevated.  However, because of his recent cardiac catheterization, this  was not an accurate level.  This will need to be redrawn in  approximately 2 months.   LABORATORY DATA:  Hemoglobin 11.8, hematocrit 34.5, WBC 6.9, platelets  221.  Sodium 139, potassium 3.7, BUN  14, creatinine 0.9.  TSH was 12.49,  uric acid was 7.  Total cholesterol was 104, triglycerides 102, HDL 19,  and LDL 165.  CK-MB and troponins were all negative.  Hemoglobin A1c was  5.7.   DISCHARGE MEDICATIONS:  1. Aspirin 325 mg a day.  2. Plavix 25 mg a day, he is not to stop.  3. Lisinopril 20 mg a day.  4. Simvastatin 20 mg at bedtime.  5. Pepcid 20 mg a day.  6. Levothyroxine 88 mcg daily.  7. Proscar 5 mg a day.  8. Metoprolol 12.5 mg twice a day.  9. Colchicine 0.6 mg twice a day x2 days, and daily x1 week, then he      can use as needed.  10.Imdur 30 mg a day.  11.Nitroglycerin 150 mg as needed.  12.Metamucil daily.   DISCHARGE INSTRUCTIONS:  He should do no pushing, pulling, lifting, or  exercise or extended walking for 1 week.  No driving for 1 day.  If he  has any problems, he can give our office a call.  He will follow up with  Dr. Domingo Sep on June 23, 2007.   DISCHARGE DIAGNOSES:  1. Unstable angina.  2. Coronary artery disease with prior left anterior descending artery      drug-eluting stent and diagonal percutaneous transluminal coronary      angioplasty stent on June 11, 2007.  This admission, he had another      Cypher stent placed to his circumflex.  3. Normal ejection fraction.  4. Elevated TSH but not accurate secondary to cath on June 11, 2007.  5. Gout with normal uric acid level.  6. Dyslipidemia.  7. History of benign prostatic hypertrophy.  8. Gastroesophageal reflux disease.      Lezlie Octave, N.P.    ______________________________  Madaline Savage, M.D.    BB/MEDQ  D:  07/14/2007  T:  07/15/2007  Job:  [id number]   cc:   Dani Gobble, MD

## 2010-08-23 NOTE — Op Note (Signed)
Jerry Cain, Jerry Cain          ACCOUNT NO.:  [id number]   MEDICAL RECORD NO.:  [id number]          PATIENT TYPE:  AMB   LOCATION:  DAY                           FACILITY:  [facility]   PHYSICIAN:  Lionel December, M.D.    DATE OF BIRTH:  1931/04/25   DATE OF PROCEDURE:  12/22/2005  DATE OF DISCHARGE:                                 OPERATIVE REPORT   PROCEDURE:  Colonoscopy.   INDICATIONS FOR PROCEDURE:  Jerry Cain is a 77 year old Caucasian male with  history of colonic polyps or adenomas.  His last exam was in November 2001.  He is free of GI symptoms.  Family history is negative for colorectal  carcinoma.  Procedure was reviewed with the patient.  Informed consent was  obtained.   MEDICATIONS FOR CONSCIOUS SEDATION:  Demerol 50 mg IV and Versed 3 mg IV.   FINDINGS:  Procedure performed in endoscopy suite.  Patient's vital signs  and O2 saturation were monitored during the procedure and remained stable.  Patient was placed in the left lateral recumbent position.  Rectal  examination performed.  No abnormality noted on external or digital exam.  Olympus videoscope was placed in the rectum and advanced under vision to the  sigmoid colon and beyond.  Preparation was satisfactory.  He had numerous  diverticula in the sigmoid and descending colon.  Scope was carefully  advanced in the splenic flexure and further intubation to cecum was easily  accomplished.  Cecum was identified by ileocecal valve and appendiceal  orifice.  Picture was taken of appendiceal orifice but not the valve.  As  the scope was withdrawn, colonic mucosa was examined carefully and no polyps  and/or tumor mass was noted.  Rectal mucosa was normal.  Scope was  retroflexed to examine anorectal junction which is unremarkable.  Endoscope  was straightened and withdrawn.  Patient tolerated the procedure well.   FINAL DIAGNOSES:  1. Examination performed to the cecum.  No evidence of recurrent polyps.  2. Extensive  left-sided diverticulosis.   RECOMMENDATIONS:  1. He will resume his usual medications and high fiber diet with fiber      supplement as before.  2. Yearly Hemoccults.  3. He may consider next exam in five years.  If the results are negative,      interval could be stretched to 10 years.      Lionel December, M.D.  Electronically Signed     NR/MEDQ  D:  12/22/2005  T:  12/22/2005  Job:  [id number]   cc:   Samuel Jester  Fax: [phone]

## 2010-12-10 ENCOUNTER — Encounter (INDEPENDENT_AMBULATORY_CARE_PROVIDER_SITE_OTHER): Payer: Self-pay | Admitting: *Deleted

## 2010-12-30 LAB — BASIC METABOLIC PANEL
BUN: 14
BUN: 15
Calcium: 8.5
Chloride: 105
Chloride: 108
Creatinine, Ser: 0.99
Creatinine, Ser: 1.04
Creatinine, Ser: 0.96
GFR calc Af Amer: >60
GFR calc Af Amer: >60
GFR calc Af Amer: >60
GFR calc non Af Amer: >60
GFR calc non Af Amer: >60
GFR calc non Af Amer: >60
Glucose, Bld: 102 — ABNORMAL HIGH
Potassium: 3.6
Potassium: 3.9
Potassium: 4
Sodium: 135
Sodium: 138
Sodium: 139

## 2010-12-30 LAB — HEPATIC FUNCTION PANEL
ALT: 28
AST: 29
Indirect Bilirubin: 0.6
Total Protein: 6.2

## 2010-12-30 LAB — CK TOTAL AND CKMB (NOT AT ARMC)
CK, MB: 1.8
CK, MB: 4.3 — ABNORMAL HIGH
CK, MB: 1.5
Relative Index: 3.3 — ABNORMAL HIGH
Total CK: 131
Total CK: 173
Total CK: 57
Total CK: 45

## 2010-12-30 LAB — DIFFERENTIAL
Basophils Absolute: 0
Basophils Absolute: 0
Basophils Relative: 0
Eosinophils Absolute: 0.2
Eosinophils Relative: 3
Lymphocytes Relative: 27
Lymphs Abs: 1.6
Lymphs Abs: 1.8
Monocytes Absolute: 0.5
Monocytes Relative: 10
Neutro Abs: 3.4
Neutrophils Relative %: 70

## 2010-12-30 LAB — POCT CARDIAC MARKERS
CKMB, poc: 1.7
CKMB, poc: 2.4
CKMB, poc: 1.4
Myoglobin, poc: 55.9
Myoglobin, poc: 56
Myoglobin, poc: 56.6
Operator id: 222501
Troponin i, poc: <0.05

## 2010-12-30 LAB — CBC
HCT: 37.2 — ABNORMAL LOW
Hemoglobin: 11.8 — ABNORMAL LOW
Hemoglobin: 12.6 — ABNORMAL LOW
MCHC: 34
MCV: 91.1
MCV: 92.4
Platelets: 186
Platelets: 207
Platelets: 212
Platelets: 260
Platelets: 234
RBC: 4.05 — ABNORMAL LOW
RBC: 4.04 — ABNORMAL LOW
RDW: 13.4
RDW: 13.9
RDW: 13.4
WBC: 6
WBC: 6.2
WBC: 7.4
WBC: 6.2
WBC: 8.5

## 2010-12-30 LAB — T4, FREE: Free T4: 1.13

## 2010-12-30 LAB — PROTIME-INR
Prothrombin Time: 13.3
Prothrombin Time: 13.6
Prothrombin Time: 13.8

## 2010-12-30 LAB — CARDIAC PANEL(CRET KIN+CKTOT+MB+TROPI)
CK, MB: 2.7
CK, MB: 3.4
CK, MB: 3.5
Relative Index: 2.7 — ABNORMAL HIGH
Relative Index: INVALID
Relative Index: INVALID
Total CK: 105
Total CK: 91
Total CK: 92
Troponin I: 0.05
Troponin I: 0.08 — ABNORMAL HIGH
Troponin I: 0.13 — ABNORMAL HIGH

## 2010-12-30 LAB — LIPASE, BLOOD: Lipase: 19

## 2010-12-30 LAB — COMPREHENSIVE METABOLIC PANEL
ALT: 23
AST: 22
AST: 29
Albumin: 3.6
Alkaline Phosphatase: 51
CO2: 28
Calcium: 8.9
Calcium: 9
Chloride: 106
Creatinine, Ser: 0.98
GFR calc Af Amer: >60
GFR calc Af Amer: >60
GFR calc non Af Amer: >60
GFR calc non Af Amer: >60
Glucose, Bld: 99
Potassium: 4.2
Sodium: 139
Total Protein: 6.3

## 2010-12-30 LAB — I-STAT 8, (EC8 V) (CONVERTED LAB)
BUN: 18
Chloride: 106
HCT: 39
Operator id: 161631
Potassium: 4.1
pCO2, Ven: 51.1 — ABNORMAL HIGH
pH, Ven: 7.336 — ABNORMAL HIGH

## 2010-12-30 LAB — TROPONIN I
Troponin I: 0.02
Troponin I: 0.02
Troponin I: 0.04

## 2010-12-30 LAB — LIPID PANEL
LDL Cholesterol: 65
Total CHOL/HDL Ratio: 5.5
VLDL: 20
VLDL: 32

## 2010-12-30 LAB — TSH
TSH: 12.429 — ABNORMAL HIGH
TSH: 8.509 — ABNORMAL HIGH

## 2010-12-30 LAB — POCT I-STAT, CHEM 8
Creatinine, Ser: 1.2
HCT: 38 — ABNORMAL LOW
Hemoglobin: 12.9 — ABNORMAL LOW
Potassium: 4.4
Sodium: 138

## 2010-12-30 LAB — HEPARIN LEVEL (UNFRACTIONATED)
Heparin Unfractionated: 0.55
Heparin Unfractionated: 0.71 — ABNORMAL HIGH

## 2010-12-30 LAB — HEMOGLOBIN A1C
Hgb A1c MFr Bld: 5.7
Hgb A1c MFr Bld: 5.9
Mean Plasma Glucose: 133

## 2010-12-30 LAB — AMYLASE: Amylase: 52

## 2010-12-30 LAB — APTT: aPTT: 31

## 2011-01-17 LAB — BASIC METABOLIC PANEL
CO2: 29
Chloride: 106
GFR calc Af Amer: >60
Sodium: 140

## 2011-01-17 LAB — HEMOGLOBIN AND HEMATOCRIT, BLOOD: HCT: 39.7

## 2011-01-29 ENCOUNTER — Encounter: Payer: Self-pay | Admitting: Gastroenterology

## 2011-01-29 ENCOUNTER — Ambulatory Visit (INDEPENDENT_AMBULATORY_CARE_PROVIDER_SITE_OTHER): Payer: Medicare Other | Admitting: Gastroenterology

## 2011-01-29 VITALS — BP 128/71 | HR 56 | Temp 97.7°F | Ht 71.0 in | Wt 186.2 lb

## 2011-01-29 DIAGNOSIS — Z860100 Personal history of colon polyps, unspecified: Secondary | ICD-10-CM | POA: Insufficient documentation

## 2011-01-29 DIAGNOSIS — Z79899 Other long term (current) drug therapy: Secondary | ICD-10-CM | POA: Insufficient documentation

## 2011-01-29 NOTE — Assessment & Plan Note (Signed)
Due for surveillance colonoscopy. Patient is now on Coumadin therapy for arrhythmia. Details not available at this time. We will contact [facility] cardiology with regards to management of his Coumadin. We would like to hold Coumadin for 4 days prior to procedure. Once we obtain a response from them the we will proceed with scheduling for colonoscopy.  I have discussed the risks, alternatives, benefits with regards to but not limited to the risk of reaction to medication, bleeding, infection, perforation and the patient is agreeable to proceed. Written consent to be obtained.

## 2011-01-29 NOTE — Patient Instructions (Addendum)
We will call and schedule a colonoscopy with you as soon as I hear back from your cardiologist regarding your Coumadin.

## 2011-01-29 NOTE — Progress Notes (Signed)
Primary Care Physician:  Alcide Evener, DO  Primary Gastroenterologist:  Roetta Sessions, MD   Chief Complaint  Patient presents with  . Colonoscopy    HPI:  Jerry Cain is a 77 y.o. male here to schedule surveillance colonoscopy. He has a history of tubular adenoma removed from the colon in 2001. Last colonoscopy was in 2007. He was advised to come back for followup in 5 years. Since he was here last he is now on Coumadin and baby asa for irregular heartrate. INR checks at [facility]. Last Friday, INR 2.3. No prior CVA or MI. Coronary artery stents (3). Previously was on Plavix and aspirin but these have been switched again to Coumadin and aspirin.   Overall he feels well. Denies any constipation, diarrhea, melena, rectal bleeding, abdominal pain, dysphagia, weight loss, vomiting. He does have some belching at times but no heartburn.    Current Outpatient Prescriptions  Medication Sig Dispense Refill  . aspirin 81 MG tablet Take 81 mg by mouth daily.        Marland Kitchen BYSTOLIC 5 MG tablet Take 5 mg by mouth daily.       . Calcium Carbonate-Vitamin D (CALCIUM + D) 600-200 MG-UNIT TABS Take by mouth.        . finasteride (PROSCAR) 5 MG tablet Take 5 mg by mouth daily.       . isosorbide mononitrate (IMDUR) 30 MG 24 hr tablet Take 30 mg by mouth daily.       Marland Kitchen LEVOXYL 88 MCG tablet Take 88 mcg by mouth daily.       Marland Kitchen lisinopril (PRINIVIL,ZESTRIL) 20 MG tablet Take 20 mg by mouth daily.        . Multiple Vitamin (MULTIVITAMIN) capsule Take 1 capsule by mouth daily.        . psyllium (METAMUCIL) 58.6 % powder Take 1 packet by mouth 3 (three) times daily.        . simvastatin (ZOCOR) 20 MG tablet Take 20 mg by mouth at bedtime.        Marland Kitchen warfarin (COUMADIN) 5 MG tablet Take 5 mg by mouth daily.        Marland Kitchen NITROSTAT 0.4 MG SL tablet         Allergies as of 01/29/2011 - Review Complete 01/29/2011  Allergen Reaction Noted  . Penicillins Other (See Comments) 01/29/2011    Past  Medical History  Diagnosis Date  . Hypertension   . Hypercholesterolemia   . Hypothyroidism   . Gout   . BPH (benign prostatic hyperplasia)   . CAD (coronary artery disease) 2009    cardiac cath and PTCA by Dr. Jenne Campus  . Tubular adenoma 2001  . Diverticulosis 2007    tcs by Dr. Karilyn Cota  . GERD (gastroesophageal reflux disease)   . Adenomatous colon polyp 2001  . Arrhythmia 2012    now on coumadin    Past Surgical History  Procedure Date  . Hernia repair 2009    left inguinal  . Cataract extraction     bilateral  . Cardiac catheterization 2009    PTCA X2 in 06/2007  . Colonoscopy 2007    Dr. Karilyn Cota- L sided diverticulosis. Next TCS 2012 due to h/o tubular adenoma.  . Esophagogastroduodenoscopy 07/2007    Dr. Elmer Ramp    Family History  Problem Relation Age of Onset  . Colon cancer Neg Hx   . Pneumonia Father   . Heart disease Brother     3 brothers  History   Social History  . Marital Status: Married    Spouse Name: N/A    Number of Children: 4  . Years of Education: N/A   Occupational History  . retired    Social History Main Topics  . Smoking status: Never Smoker   . Smokeless tobacco: Not on file  . Alcohol Use: No  . Drug Use: No  . Sexually Active: Not on file   Other Topics Concern  . Not on file   Social History Narrative  . No narrative on file      ROS:  General: Negative for anorexia, weight loss, fever, chills, fatigue, weakness. Eyes: Negative for vision changes.  ENT: Negative for hoarseness, difficulty swallowing , nasal congestion. CV: Negative for chest pain, angina, palpitations, dyspnea on exertion, peripheral edema.  Respiratory: Negative for dyspnea at rest, dyspnea on exertion, cough, sputum, wheezing.  GI: See history of present illness. GU:  Negative for dysuria, hematuria, urinary incontinence, urinary frequency, nocturnal urination.  MS: Negative for joint pain, low back pain.  Derm: Negative for rash or  itching.  Neuro: Negative for weakness, abnormal sensation, seizure, frequent headaches, memory loss, confusion.  Psych: Negative for anxiety, depression, suicidal ideation, hallucinations.  Endo: Negative for unusual weight change.  Heme: Negative for bruising or bleeding. Allergy: Negative for rash or hives.    Physical Examination:  BP 128/71  Pulse 56  Temp(Src) 97.7 F (36.5 C) (Temporal)  Ht 5\' 11"  (1.803 m)  Wt 186 lb 3.2 oz (84.46 kg)  BMI 25.97 kg/m2   General: Well-nourished, well-developed in no acute distress.  Head: Normocephalic, atraumatic.   Eyes: Conjunctiva pink, no icterus. Mouth: Oropharyngeal mucosa moist and pink , no lesions erythema or exudate. Neck: Supple without thyromegaly, masses, or lymphadenopathy.  Lungs: Clear to auscultation bilaterally.  Heart: Regular rate and rhythm, no murmurs rubs or gallops.  Abdomen: Bowel sounds are normal, nontender, nondistended, no hepatosplenomegaly or masses, no abdominal bruits or    hernia , no rebound or guarding.   Rectal: Deferred, colonoscopy. Extremities: No lower extremity edema. No clubbing or deformities.  Neuro: Alert and oriented x 4 , grossly normal neurologically.  Skin: Warm and dry, no rash or jaundice.   Psych: Alert and cooperative, normal mood and affect.

## 2011-01-30 ENCOUNTER — Telehealth: Payer: Self-pay | Admitting: Gastroenterology

## 2011-01-30 NOTE — Telephone Encounter (Signed)
Still waiting on info about patient's coumadin. Need approval by cardiologist at [facility] to hold coumadin for four days prior to procedure.   Please find out indication for coumadin.  Need ASAP.

## 2011-01-30 NOTE — Progress Notes (Signed)
Cc to PCP 

## 2011-01-31 NOTE — Telephone Encounter (Signed)
Doris faxed a note to Cardiologist.

## 2011-02-03 ENCOUNTER — Telehealth: Payer: Self-pay

## 2011-02-03 NOTE — Telephone Encounter (Signed)
Received a fax from [facility] that it is ok for pt to hold coumadin 5 days prior to colonoscopy and restart after procedure. No bridging needed.   HE TAKES COUMADIN FOR PAROXYSMAL ATRIAL FIBRILLATION. (Paper work on Haematologist)

## 2011-02-03 NOTE — Telephone Encounter (Signed)
Refaxed the request this AM.

## 2011-02-04 ENCOUNTER — Encounter: Payer: Self-pay | Admitting: Gastroenterology

## 2011-02-04 NOTE — Telephone Encounter (Signed)
addressed

## 2011-02-04 NOTE — Telephone Encounter (Signed)
Please go ahead and schedule TCS.  Hold coumadin four days before procedure.

## 2011-02-06 ENCOUNTER — Other Ambulatory Visit: Payer: Self-pay | Admitting: Gastroenterology

## 2011-02-06 DIAGNOSIS — D126 Benign neoplasm of colon, unspecified: Secondary | ICD-10-CM

## 2011-02-06 NOTE — Telephone Encounter (Signed)
Pt is scheduled for a TCS on 02/26/11- instructions mailed

## 2011-02-25 MED ORDER — SODIUM CHLORIDE 0.45 % IV SOLN
Freq: Once | INTRAVENOUS | Status: AC
  Administered 2011-02-26: 10:00:00 via INTRAVENOUS

## 2011-02-26 ENCOUNTER — Encounter (HOSPITAL_COMMUNITY): Payer: Self-pay | Admitting: *Deleted

## 2011-02-26 ENCOUNTER — Encounter (HOSPITAL_COMMUNITY)
Admission: RE | Disposition: A | Discharge status: Home / Self Care | Payer: Self-pay | Source: Ambulatory Visit | Attending: Internal Medicine

## 2011-02-26 ENCOUNTER — Ambulatory Visit (HOSPITAL_COMMUNITY)
Admission: RE | Admit: 2011-02-26 | Discharge: 2011-02-26 | Disposition: A | Discharge status: Home / Self Care | Payer: Medicare Other | Source: Ambulatory Visit | Attending: Internal Medicine | Admitting: Internal Medicine

## 2011-02-26 DIAGNOSIS — K5730 Diverticulosis of large intestine without perforation or abscess without bleeding: Secondary | ICD-10-CM

## 2011-02-26 DIAGNOSIS — D126 Benign neoplasm of colon, unspecified: Secondary | ICD-10-CM

## 2011-02-26 DIAGNOSIS — Z7901 Long term (current) use of anticoagulants: Secondary | ICD-10-CM | POA: Insufficient documentation

## 2011-02-26 DIAGNOSIS — Z860100 Personal history of colon polyps, unspecified: Secondary | ICD-10-CM | POA: Insufficient documentation

## 2011-02-26 DIAGNOSIS — Z09 Encounter for follow-up examination after completed treatment for conditions other than malignant neoplasm: Secondary | ICD-10-CM | POA: Insufficient documentation

## 2011-02-26 DIAGNOSIS — I10 Essential (primary) hypertension: Secondary | ICD-10-CM | POA: Insufficient documentation

## 2011-02-26 DIAGNOSIS — Z79899 Other long term (current) drug therapy: Secondary | ICD-10-CM | POA: Insufficient documentation

## 2011-02-26 DIAGNOSIS — E7800 Pure hypercholesterolemia, unspecified: Secondary | ICD-10-CM | POA: Insufficient documentation

## 2011-02-26 HISTORY — PX: COLONOSCOPY: SHX5424

## 2011-02-26 SURGERY — COLONOSCOPY
Anesthesia: Moderate Sedation

## 2011-02-26 MED ORDER — MIDAZOLAM HCL 5 MG/5ML IJ SOLN
INTRAMUSCULAR | Status: AC
  Filled 2011-02-26: qty 10

## 2011-02-26 MED ORDER — MIDAZOLAM HCL 5 MG/5ML IJ SOLN
INTRAMUSCULAR | Status: DC | PRN
  Administered 2011-02-26: 1 mg via INTRAVENOUS
  Administered 2011-02-26: 2 mg via INTRAVENOUS
  Administered 2011-02-26 (×2): 1 mg via INTRAVENOUS

## 2011-02-26 MED ORDER — MEPERIDINE HCL 100 MG/ML IJ SOLN
INTRAMUSCULAR | Status: DC | PRN
  Administered 2011-02-26 (×2): 25 mg via INTRAVENOUS

## 2011-02-26 MED ORDER — MEPERIDINE HCL 100 MG/ML IJ SOLN
INTRAMUSCULAR | Status: AC
  Filled 2011-02-26: qty 2

## 2011-02-26 NOTE — H&P (Signed)
  I have seen & examined the patient prior to the procedure(s) today and reviewed the history and physical/consultation.  There have been no changes.  After consideration of the risks, benefits, alternatives and imponderables, the patient has consented to the procedure(s).   

## 2011-03-06 ENCOUNTER — Encounter (HOSPITAL_COMMUNITY): Payer: Self-pay | Admitting: Internal Medicine

## 2011-05-28 DIAGNOSIS — I4891 Unspecified atrial fibrillation: Secondary | ICD-10-CM | POA: Diagnosis not present

## 2011-05-28 DIAGNOSIS — H40019 Open angle with borderline findings, low risk, unspecified eye: Secondary | ICD-10-CM | POA: Diagnosis not present

## 2011-06-17 DIAGNOSIS — I4891 Unspecified atrial fibrillation: Secondary | ICD-10-CM | POA: Diagnosis not present

## 2011-06-17 DIAGNOSIS — E785 Hyperlipidemia, unspecified: Secondary | ICD-10-CM | POA: Diagnosis not present

## 2011-06-17 DIAGNOSIS — I10 Essential (primary) hypertension: Secondary | ICD-10-CM | POA: Diagnosis not present

## 2011-06-17 DIAGNOSIS — E039 Hypothyroidism, unspecified: Secondary | ICD-10-CM | POA: Diagnosis not present

## 2011-06-17 DIAGNOSIS — I2510 Atherosclerotic heart disease of native coronary artery without angina pectoris: Secondary | ICD-10-CM | POA: Diagnosis not present

## 2011-06-17 DIAGNOSIS — N400 Enlarged prostate without lower urinary tract symptoms: Secondary | ICD-10-CM | POA: Diagnosis not present

## 2011-06-25 DIAGNOSIS — I4891 Unspecified atrial fibrillation: Secondary | ICD-10-CM | POA: Diagnosis not present

## 2011-07-14 DIAGNOSIS — I10 Essential (primary) hypertension: Secondary | ICD-10-CM | POA: Diagnosis not present

## 2011-07-14 DIAGNOSIS — I2510 Atherosclerotic heart disease of native coronary artery without angina pectoris: Secondary | ICD-10-CM | POA: Diagnosis not present

## 2011-07-14 DIAGNOSIS — E782 Mixed hyperlipidemia: Secondary | ICD-10-CM | POA: Diagnosis not present

## 2011-07-14 DIAGNOSIS — I4891 Unspecified atrial fibrillation: Secondary | ICD-10-CM | POA: Diagnosis not present

## 2011-07-19 DIAGNOSIS — E782 Mixed hyperlipidemia: Secondary | ICD-10-CM | POA: Diagnosis not present

## 2011-08-11 DIAGNOSIS — I4891 Unspecified atrial fibrillation: Secondary | ICD-10-CM | POA: Diagnosis not present

## 2011-08-29 DIAGNOSIS — I4891 Unspecified atrial fibrillation: Secondary | ICD-10-CM | POA: Diagnosis not present

## 2011-09-10 ENCOUNTER — Encounter (HOSPITAL_COMMUNITY): Payer: Self-pay | Admitting: *Deleted

## 2011-09-10 ENCOUNTER — Emergency Department (HOSPITAL_COMMUNITY): Payer: Medicare Other

## 2011-09-10 ENCOUNTER — Emergency Department (HOSPITAL_COMMUNITY)
Admission: EM | Admit: 2011-09-10 | Discharge: 2011-09-10 | Disposition: A | Discharge status: Home / Self Care | Payer: Medicare Other | Attending: Emergency Medicine | Admitting: Emergency Medicine

## 2011-09-10 DIAGNOSIS — Z860100 Personal history of colon polyps, unspecified: Secondary | ICD-10-CM | POA: Insufficient documentation

## 2011-09-10 DIAGNOSIS — I10 Essential (primary) hypertension: Secondary | ICD-10-CM

## 2011-09-10 DIAGNOSIS — E039 Hypothyroidism, unspecified: Secondary | ICD-10-CM | POA: Insufficient documentation

## 2011-09-10 DIAGNOSIS — M109 Gout, unspecified: Secondary | ICD-10-CM | POA: Diagnosis not present

## 2011-09-10 DIAGNOSIS — Z79899 Other long term (current) drug therapy: Secondary | ICD-10-CM | POA: Insufficient documentation

## 2011-09-10 DIAGNOSIS — R42 Dizziness and giddiness: Secondary | ICD-10-CM | POA: Diagnosis not present

## 2011-09-10 DIAGNOSIS — R404 Transient alteration of awareness: Secondary | ICD-10-CM | POA: Diagnosis not present

## 2011-09-10 DIAGNOSIS — I119 Hypertensive heart disease without heart failure: Secondary | ICD-10-CM | POA: Diagnosis not present

## 2011-09-10 DIAGNOSIS — N400 Enlarged prostate without lower urinary tract symptoms: Secondary | ICD-10-CM | POA: Diagnosis not present

## 2011-09-10 DIAGNOSIS — Z7982 Long term (current) use of aspirin: Secondary | ICD-10-CM | POA: Diagnosis not present

## 2011-09-10 DIAGNOSIS — I4891 Unspecified atrial fibrillation: Secondary | ICD-10-CM | POA: Insufficient documentation

## 2011-09-10 DIAGNOSIS — K5730 Diverticulosis of large intestine without perforation or abscess without bleeding: Secondary | ICD-10-CM | POA: Insufficient documentation

## 2011-09-10 DIAGNOSIS — K219 Gastro-esophageal reflux disease without esophagitis: Secondary | ICD-10-CM | POA: Insufficient documentation

## 2011-09-10 DIAGNOSIS — E7800 Pure hypercholesterolemia, unspecified: Secondary | ICD-10-CM | POA: Insufficient documentation

## 2011-09-10 DIAGNOSIS — Z7901 Long term (current) use of anticoagulants: Secondary | ICD-10-CM | POA: Diagnosis not present

## 2011-09-10 DIAGNOSIS — I2510 Atherosclerotic heart disease of native coronary artery without angina pectoris: Secondary | ICD-10-CM | POA: Diagnosis not present

## 2011-09-10 DIAGNOSIS — R079 Chest pain, unspecified: Secondary | ICD-10-CM | POA: Insufficient documentation

## 2011-09-10 LAB — BASIC METABOLIC PANEL
Calcium: 8.9 mg/dL (ref 8.4–10.5)
GFR calc Af Amer: >90 mL/min (ref >90)
GFR calc non Af Amer: 81 mL/min — ABNORMAL LOW (ref >90)
Glucose, Bld: 94 mg/dL (ref 70–99)
Potassium: 3.6 mEq/L (ref 3.5–5.1)
Sodium: 136 mEq/L (ref 135–145)

## 2011-09-10 LAB — CBC
Hemoglobin: 12.4 g/dL — ABNORMAL LOW (ref 13.0–17.0)
MCH: 31.3 pg (ref 26.0–34.0)
MCHC: 34.2 g/dL (ref 30.0–36.0)
Platelets: 134 10*3/uL — ABNORMAL LOW (ref 150–400)

## 2011-09-10 LAB — PROTIME-INR
INR: 2.43 — ABNORMAL HIGH (ref 0.00–1.49)
Prothrombin Time: 26.8 seconds — ABNORMAL HIGH (ref 11.6–15.2)

## 2011-09-10 LAB — TROPONIN I: Troponin I: <0.3 ng/mL (ref <0.30)

## 2011-09-10 NOTE — ED Provider Notes (Signed)
History     CSN: [id number]  Arrival date & time 09/10/11  0033   First MD Initiated Contact with Patient 09/10/11 0041      Chief Complaint  Patient presents with  . Chest Pain  . Hypertension    (Consider location/radiation/quality/duration/timing/severity/associated sxs/prior treatment) HPI Jerry Cain is a 78 y.o. male brought in by ambulance, who presents to the Emergency Department complaining of high blood pressure. Patient had an episode of dizziness and took his blood pressure. It was 220/98. Calledf the fire department who also checked blood pressure which registered high at 220/100. Repeated BP were elevated.Patient took ASA and 1 SL NTG. While he experienced dizziness when he got up from the couch and when he tried to lie down, he did not have shortness of breath, chest pain, nausea, or sweating. When EMS arrived, BP remained elevated. They administered 2 additional SL NTG. Upon arrival int he ER BP 148/73, dizziness resolved. Continue to be pain free.   PCP Dr. Charm Barges Cardiology Dr. Oliver Pila [facility])    Past Medical History  Diagnosis Date  . Hypertension   . Hypercholesterolemia   . Hypothyroidism   . Gout   . BPH (benign prostatic hyperplasia)   . CAD (coronary artery disease) 2009    cardiac cath and PTCA by Dr. Jenne Campus  . Tubular adenoma 2001  . Diverticulosis 2007    tcs by Dr. Karilyn Cota  . GERD (gastroesophageal reflux disease)   . Adenomatous colon polyp 2001  . Paroxysmal a-fib 2012    now on coumadin    Past Surgical History  Procedure Date  . Hernia repair 2009    left inguinal  . Cataract extraction     bilateral  . Cardiac catheterization 2009    PTCA X2 in 06/2007  . Colonoscopy 2007    Dr. Karilyn Cota- L sided diverticulosis. Next TCS 2012 due to h/o tubular adenoma.  . Esophagogastroduodenoscopy 07/2007    Dr. Elmer Ramp  . Colonoscopy 02/26/2011    Procedure: COLONOSCOPY;  Surgeon: Corbin Ade, MD;  Location: [facility];   Service: Endoscopy;  Laterality: N/A;  11:05    Family History  Problem Relation Age of Onset  . Colon cancer Neg Hx   . Pneumonia Father   . Heart disease Brother     3 brothers    History  Substance Use Topics  . Smoking status: Never Smoker   . Smokeless tobacco: Not on file  . Alcohol Use: No      Review of Systems  Constitutional: Negative for fever.       10 Systems reviewed and are negative for acute change except as noted in the HPI.  HENT: Negative for congestion.   Eyes: Negative for discharge and redness.  Respiratory: Negative for cough and shortness of breath.   Cardiovascular: Negative for chest pain.  Gastrointestinal: Negative for vomiting and abdominal pain.  Musculoskeletal: Negative for back pain.  Skin: Negative for rash.  Neurological: Positive for dizziness. Negative for syncope, numbness and headaches.  Psychiatric/Behavioral:       No behavior change.    Allergies  Penicillins  Home Medications   Current Outpatient Rx  Name Route Sig Dispense Refill  . ASPIRIN 81 MG PO TABS Oral Take 81 mg by mouth daily.      Marland Kitchen BYSTOLIC 5 MG PO TABS Oral Take 5 mg by mouth daily.     Marland Kitchen CALCIUM CARBONATE-VITAMIN D 600-200 MG-UNIT PO TABS Oral Take by mouth.      Marland Kitchen  FINASTERIDE 5 MG PO TABS Oral Take 5 mg by mouth daily.     . ISOSORBIDE MONONITRATE ER 30 MG PO TB24 Oral Take 30 mg by mouth daily.     Marland Kitchen LEVOXYL 88 MCG PO TABS Oral Take 88 mcg by mouth daily.     Marland Kitchen LISINOPRIL 20 MG PO TABS Oral Take 20 mg by mouth daily.      . MULTIVITAMINS PO CAPS Oral Take 1 capsule by mouth daily.      Marland Kitchen NITROSTAT 0.4 MG SL SUBL      . PSYLLIUM 58.6 % PO POWD Oral Take 1 packet by mouth 3 (three) times daily.      Marland Kitchen SIMVASTATIN 20 MG PO TABS Oral Take 20 mg by mouth at bedtime.      . WARFARIN SODIUM 5 MG PO TABS Oral Take 5 mg by mouth daily.        BP 148/73  Temp 97.9 F (36.6 C)  Resp 14  Ht 5\' 10"  (1.778 m)  Wt 180 lb (81.647 kg)  BMI 25.83 kg/m2  SpO2  98%  Physical Exam  Nursing note and vitals reviewed. Constitutional: He is oriented to person, place, and time. He appears well-developed and well-nourished.       Awake, alert, nontoxic appearance.  HENT:  Head: Atraumatic.  Eyes: Right eye exhibits no discharge. Left eye exhibits no discharge.  Neck: Neck supple.  Cardiovascular: Regular rhythm, normal heart sounds and intact distal pulses.        bradycardia  Pulmonary/Chest: Effort normal. He exhibits no tenderness.  Abdominal: Soft. There is no tenderness. There is no rebound.  Musculoskeletal: He exhibits no tenderness.       Baseline ROM, no obvious new focal weakness.  Neurological: He is alert and oriented to person, place, and time.       Mental status and motor strength appears baseline for patient and situation.  Skin: Skin is warm and dry. No rash noted.  Psychiatric: He has a normal mood and affect.    ED Course  Procedures (including critical care time) Results for orders placed during the hospital encounter of 09/10/11  CBC      Component Value Range   WBC 4.7  4.0 - 10.5 (K/uL)   RBC 3.96 (*) 4.22 - 5.81 (MIL/uL)   Hemoglobin 12.4 (*) 13.0 - 17.0 (g/dL)   HCT 16.1 (*) 09.6 - 52.0 (%)   MCV 91.7  78.0 - 100.0 (fL)   MCH 31.3  26.0 - 34.0 (pg)   MCHC 34.2  30.0 - 36.0 (g/dL)   RDW 04.5  40.9 - 81.1 (%)   Platelets 134 (*) 150 - 400 (K/uL)  BASIC METABOLIC PANEL      Component Value Range   Sodium 136  135 - 145 (mEq/L)   Potassium 3.6  3.5 - 5.1 (mEq/L)   Chloride 102  96 - 112 (mEq/L)   CO2 26  19 - 32 (mEq/L)   Glucose, Bld 94  70 - 99 (mg/dL)   BUN 19  6 - 23 (mg/dL)   Creatinine, Ser 9.14  0.50 - 1.35 (mg/dL)   Calcium 8.9  8.4 - 78.2 (mg/dL)   GFR calc non Af Amer 81 (*) >90 (mL/min)   GFR calc Af Amer >90  >90 (mL/min)  TROPONIN I      Component Value Range   Troponin I <0.30  <0.30 (ng/mL)  PROTIME-INR      Component Value Range   Prothrombin Time  26.8 (*) 11.6 - 15.2 (seconds)   INR 2.43  (*) 0.00 - 1.49      Date: 09/10/2011  0034  Rate: 53  Rhythm: sinus bradycardia  QRS Axis: normal  Intervals: normal  ST/T Wave abnormalities: normal  Conduction Disutrbances:first-degree A-V block   Narrative Interpretation:   Old EKG Reviewed: unchanged c/w 07/11/2009  Dg Chest Portable 1 View  09/10/2011  *RADIOLOGY REPORT*  Clinical Data: Chest pain.  PORTABLE CHEST - 1 VIEW  Comparison: 06/29/2007.  Findings: The cardiac silhouette, mediastinal and hilar contours are stable.  Mild chronic bronchitic type lung changes but no infiltrates, edema or effusions.  Stable paratracheal soft tissue thickening likely due to thyroid goiter.  IMPRESSION: No acute cardiopulmonary findings.  Original Report Authenticated By: P. Loralie Champagne, M.D.     MDM  Patient presents with complaints of elevated blood pressure that began earlier this evening and was accompanied by dizziness. He had taken aspirin, and one nitroglycerin. He was given 2 additional nitroglycerin by EMS. Upon arrival in the emergency room his blood pressure had normalized to 140/73. His dizziness had resolved. He has remained pain-free and without dizziness since arrival. Labs are unremarkable. Coumadin is therapeutic with an INR of 2.43. EKG is normal for the patient and chest x-ray shows no acute process.Dx testing d/w pt and family.  Questions answered.  Verb understanding, agreeable to d/c home with outpt f/u.Pt stable in ED with no significant deterioration in condition.The patient appears reasonably screened and/or stabilized for discharge and I doubt any other medical condition or other [facility] requiring further screening, evaluation, or treatment in the ED at this time prior to discharge.  MDM Reviewed: nursing note and vitals Interpretation: labs, ECG and x-ray            Nicoletta Dress. Colon Branch, MD 09/10/11 [phone]

## 2011-09-10 NOTE — ED Notes (Signed)
Pt took 324mg  ASA at home PTA of EMS and 1 nitro. EMS gave 2 additional nitro

## 2011-09-10 NOTE — Discharge Instructions (Signed)
Your blood work here including your heart numbers are normal. Your coumadin level is good at 2.43. Your blood pressure has been normal the entire time you have been here. That is probably due to the nitroglycerin you have taken tonight. Continue to monitor your blood pressure. Take those readings to your doctor as she or your cardiologist may want to adjust medicines.     Hypertension Information As your heart beats, it forces blood through your arteries. This force is your blood pressure. If the pressure is too high, it is called hypertension (HTN) or high blood pressure. HTN is dangerous because you may have it and not know it. High blood pressure may mean that your heart has to work harder to pump blood. Your arteries may be narrow or stiff. The extra work puts you at risk for heart disease, stroke, and other problems.  Blood pressure consists of two numbers, a higher number over a lower, 110/72, for example. It is stated as "110 over 72." The ideal is below 120 for the top number (systolic) and under 80 for the bottom (diastolic).  You should pay close attention to your blood pressure if you have certain conditions such as:  Heart failure.   Prior heart attack.   Diabetes   Chronic kidney disease.   Prior stroke.   Multiple risk factors for heart disease.  To see if you have HTN, your blood pressure should be measured while you are seated with your arm held at the level of the heart. It should be measured at least twice. A one-time elevated blood pressure reading (especially in the Emergency Department) does not mean that you need treatment. There may be conditions in which the blood pressure is different between your right and left arms. It is important to see your caregiver soon for a recheck. Most people have essential hypertension which means that there is not a specific cause. This type of high blood pressure may be lowered by changing lifestyle factors such as:  Stress.   Smoking.     Lack of exercise.   Excessive weight.   Drug/tobacco/alcohol use.   Eating less salt.  Most people do not have symptoms from high blood pressure until it has caused damage to the body. Effective treatment can often prevent, delay or reduce that damage. TREATMENT  Treatment for high blood pressure, when a cause has been identified, is directed at the cause. There are a large number of medications to treat HTN. These fall into several categories, and your caregiver will help you select the medicines that are best for you. Medications may have side effects. You should review side effects with your caregiver. If your blood pressure stays high after you have made lifestyle changes or started on medicines,   Your medication(s) may need to be changed.   Other problems may need to be addressed.   Be certain you understand your prescriptions, and know how and when to take your medicine.   Be sure to follow up with your caregiver within the time frame advised (usually within two weeks) to have your blood pressure rechecked and to review your medications.   If you are taking more than one medicine to lower your blood pressure, make sure you know how and at what times they should be taken. Taking two medicines at the same time can result in blood pressure that is too low.  Document Released: 05/27/2005 Document Revised: 12/04/2010 Document Reviewed: 06/03/2007 [facility] Patient Information 2012 [place], [place].

## 2011-09-10 NOTE — ED Notes (Signed)
Patient denies chest pain upon arrival to er

## 2011-09-18 DIAGNOSIS — E782 Mixed hyperlipidemia: Secondary | ICD-10-CM | POA: Diagnosis not present

## 2011-09-18 DIAGNOSIS — I10 Essential (primary) hypertension: Secondary | ICD-10-CM | POA: Diagnosis not present

## 2011-09-18 DIAGNOSIS — I2510 Atherosclerotic heart disease of native coronary artery without angina pectoris: Secondary | ICD-10-CM | POA: Diagnosis not present

## 2011-09-18 DIAGNOSIS — I4891 Unspecified atrial fibrillation: Secondary | ICD-10-CM | POA: Diagnosis not present

## 2011-09-19 DIAGNOSIS — E785 Hyperlipidemia, unspecified: Secondary | ICD-10-CM | POA: Diagnosis not present

## 2011-09-19 DIAGNOSIS — I10 Essential (primary) hypertension: Secondary | ICD-10-CM | POA: Diagnosis not present

## 2011-09-19 DIAGNOSIS — I4891 Unspecified atrial fibrillation: Secondary | ICD-10-CM | POA: Diagnosis not present

## 2011-09-19 DIAGNOSIS — E782 Mixed hyperlipidemia: Secondary | ICD-10-CM | POA: Diagnosis not present

## 2011-09-19 DIAGNOSIS — M719 Bursopathy, unspecified: Secondary | ICD-10-CM | POA: Diagnosis not present

## 2011-09-19 DIAGNOSIS — E039 Hypothyroidism, unspecified: Secondary | ICD-10-CM | POA: Diagnosis not present

## 2011-09-29 DIAGNOSIS — I4891 Unspecified atrial fibrillation: Secondary | ICD-10-CM | POA: Diagnosis not present

## 2011-10-20 DIAGNOSIS — I4891 Unspecified atrial fibrillation: Secondary | ICD-10-CM | POA: Diagnosis not present

## 2011-11-10 DIAGNOSIS — R339 Retention of urine, unspecified: Secondary | ICD-10-CM | POA: Diagnosis not present

## 2011-11-10 DIAGNOSIS — R972 Elevated prostate specific antigen [PSA]: Secondary | ICD-10-CM | POA: Diagnosis not present

## 2011-11-10 DIAGNOSIS — N401 Enlarged prostate with lower urinary tract symptoms: Secondary | ICD-10-CM | POA: Diagnosis not present

## 2011-11-18 DIAGNOSIS — I4891 Unspecified atrial fibrillation: Secondary | ICD-10-CM | POA: Diagnosis not present

## 2011-12-10 ENCOUNTER — Other Ambulatory Visit: Payer: Self-pay | Admitting: Cardiovascular Disease

## 2011-12-10 DIAGNOSIS — I10 Essential (primary) hypertension: Secondary | ICD-10-CM | POA: Diagnosis not present

## 2011-12-10 DIAGNOSIS — I2510 Atherosclerotic heart disease of native coronary artery without angina pectoris: Secondary | ICD-10-CM | POA: Diagnosis not present

## 2011-12-10 DIAGNOSIS — I4891 Unspecified atrial fibrillation: Secondary | ICD-10-CM | POA: Diagnosis not present

## 2011-12-10 DIAGNOSIS — E782 Mixed hyperlipidemia: Secondary | ICD-10-CM | POA: Diagnosis not present

## 2011-12-11 DIAGNOSIS — R079 Chest pain, unspecified: Secondary | ICD-10-CM | POA: Diagnosis not present

## 2011-12-11 DIAGNOSIS — R0602 Shortness of breath: Secondary | ICD-10-CM | POA: Diagnosis not present

## 2011-12-11 DIAGNOSIS — I2510 Atherosclerotic heart disease of native coronary artery without angina pectoris: Secondary | ICD-10-CM | POA: Diagnosis not present

## 2011-12-12 ENCOUNTER — Encounter (HOSPITAL_COMMUNITY)
Admission: RE | Disposition: A | Discharge status: Home / Self Care | Payer: Self-pay | Source: Ambulatory Visit | Attending: Cardiovascular Disease

## 2011-12-12 ENCOUNTER — Ambulatory Visit (HOSPITAL_COMMUNITY): Payer: Medicare Other | Admitting: Anesthesiology

## 2011-12-12 ENCOUNTER — Encounter (HOSPITAL_COMMUNITY): Payer: Self-pay | Admitting: Anesthesiology

## 2011-12-12 ENCOUNTER — Ambulatory Visit (HOSPITAL_COMMUNITY)
Admission: RE | Admit: 2011-12-12 | Discharge: 2011-12-12 | Disposition: A | Discharge status: Home / Self Care | Payer: Medicare Other | Source: Ambulatory Visit | Attending: Cardiovascular Disease | Admitting: Cardiovascular Disease

## 2011-12-12 ENCOUNTER — Encounter (HOSPITAL_COMMUNITY): Payer: Self-pay

## 2011-12-12 DIAGNOSIS — I4891 Unspecified atrial fibrillation: Secondary | ICD-10-CM | POA: Insufficient documentation

## 2011-12-12 HISTORY — PX: TEE WITHOUT CARDIOVERSION: SHX5443

## 2011-12-12 HISTORY — PX: CARDIOVERSION: SHX1299

## 2011-12-12 LAB — PROTIME-INR: INR: 2.83 — ABNORMAL HIGH (ref 0.00–1.49)

## 2011-12-12 SURGERY — ECHOCARDIOGRAM, TRANSESOPHAGEAL
Anesthesia: Moderate Sedation

## 2011-12-12 MED ORDER — LIDOCAINE HCL (CARDIAC) 20 MG/ML IV SOLN
INTRAVENOUS | Status: DC | PRN
  Administered 2011-12-12: 30 mg via INTRAVENOUS

## 2011-12-12 MED ORDER — FENTANYL CITRATE 0.05 MG/ML IJ SOLN
INTRAMUSCULAR | Status: DC | PRN
  Administered 2011-12-12: 50 ug via INTRAVENOUS

## 2011-12-12 MED ORDER — BUTAMBEN-TETRACAINE-BENZOCAINE 2-2-14 % EX AERO
INHALATION_SPRAY | CUTANEOUS | Status: DC | PRN
  Administered 2011-12-12: 2 via TOPICAL

## 2011-12-12 MED ORDER — PROPOFOL 10 MG/ML IV BOLUS
INTRAVENOUS | Status: DC | PRN
  Administered 2011-12-12: 50 mg via INTRAVENOUS

## 2011-12-12 MED ORDER — MIDAZOLAM HCL 5 MG/ML IJ SOLN
INTRAMUSCULAR | Status: AC
  Filled 2011-12-12: qty 2

## 2011-12-12 MED ORDER — MIDAZOLAM HCL 10 MG/2ML IJ SOLN
INTRAMUSCULAR | Status: DC | PRN
  Administered 2011-12-12: 1 mg via INTRAVENOUS
  Administered 2011-12-12: 2 mg via INTRAVENOUS

## 2011-12-12 MED ORDER — FENTANYL CITRATE 0.05 MG/ML IJ SOLN
INTRAMUSCULAR | Status: AC
  Filled 2011-12-12: qty 2

## 2011-12-12 MED ORDER — SODIUM CHLORIDE 0.9 % IV SOLN
INTRAVENOUS | Status: DC
  Administered 2011-12-12: 500 mL via INTRAVENOUS

## 2011-12-12 NOTE — Anesthesia Postprocedure Evaluation (Signed)
  Anesthesia Post-op Note  Patient: Jerry Cain  Procedure(s) Performed: Procedure(s) (LRB) with comments: TRANSESOPHAGEAL ECHOCARDIOGRAM (TEE) (N/A) CARDIOVERSION (N/A)  Patient Location: Endoscopy Unit  Anesthesia Type: General  Level of Consciousness: awake  Airway and Oxygen Therapy: Patient Spontanous Breathing and Patient connected to nasal cannula oxygen  Post-op Pain: none  Post-op Assessment: Post-op Vital signs reviewed, Patient's Cardiovascular Status Stable, Respiratory Function Stable, Patent Airway and No signs of Nausea or vomiting  Post-op Vital Signs: Reviewed and stable  Complications: No apparent anesthesia complications

## 2011-12-12 NOTE — H&P (Signed)
Date of Initial H&P: 12/10/11  History reviewed, patient examined, no change in status, stable for TEE guided cardioversion. This procedure has been fully reviewed with the patient and written informed consent has been obtained.  Thurmon Fair, MD, [facility] [phone] office [phone] pager 12/12/2011 1:19 PM

## 2011-12-12 NOTE — Anesthesia Preprocedure Evaluation (Signed)
Anesthesia Evaluation  Patient identified by MRN, date of birth, ID band Patient awake    Reviewed: Allergy & Precautions, H&P , NPO status , Patient's Chart, lab work & pertinent test results  Airway Mallampati: II TM Distance: >3 FB     Dental No notable dental hx. (+) Teeth Intact and Dental Advisory Given   Pulmonary neg pulmonary ROS,    Pulmonary exam normal       Cardiovascular hypertension, On Medications + CAD + dysrhythmias Atrial Fibrillation     Neuro/Psych negative neurological ROS  negative psych ROS   GI/Hepatic Neg liver ROS, GERD-  Medicated,  Endo/Other  negative endocrine ROS  Renal/GU negative Renal ROS  negative genitourinary   Musculoskeletal   Abdominal   Peds  Hematology negative hematology ROS (+)   Anesthesia Other Findings   Reproductive/Obstetrics negative OB ROS                           Anesthesia Physical Anesthesia Plan  ASA: III  Anesthesia Plan: General   Post-op Pain Management:    Induction: Intravenous  Airway Management Planned: Mask  Additional Equipment:   Intra-op Plan:   Post-operative Plan:   Informed Consent: I have reviewed the patients History and Physical, chart, labs and discussed the procedure including the risks, benefits and alternatives for the proposed anesthesia with the patient or authorized representative who has indicated his/her understanding and acceptance.   Dental advisory given  Plan Discussed with: CRNA  Anesthesia Plan Comments:         Anesthesia Quick Evaluation

## 2011-12-12 NOTE — CV Procedure (Signed)
Procedure: Electrical Cardioversion Indications:  Atrial Fibrillation  Procedure Details:  Consent: Risks of procedure as well as the alternatives and risks of each were explained to the (patient/caregiver).  Consent for procedure obtained.  Time Out: Verified patient identification, verified procedure, site/side was marked, verified correct patient position, special equipment/implants available, medications/allergies/relevent history reviewed, required imaging and test results available.  Performed  Patient placed on cardiac monitor, pulse oximetry, supplemental oxygen as necessary.  Sedation given: Propofol 50 mg IV Dr. Sampson Goon, Anesthesiology Pacer pads placed anterior and posterior chest.  Cardioverted 1 time(s).  Cardioverted at 150J.Synchronized biphasic  Evaluation: Findings: Post procedure EKG shows: NSR Complications: None Patient did tolerate procedure well.  Time Spent Directly with the Patient:  30  minutes   Jerry Cain 12/12/2011, 1:40 PM

## 2011-12-12 NOTE — Progress Notes (Signed)
  Echocardiogram 2D Echocardiogram has been performed.  Jerry Cain 12/12/2011, 1:45 PM

## 2011-12-12 NOTE — Progress Notes (Signed)
Cardioversion at 1339. 150 joules converted to sinus brady.

## 2011-12-12 NOTE — Transfer of Care (Signed)
Immediate Anesthesia Transfer of Care Note  Patient: Jerry Cain  Procedure(s) Performed: Procedure(s) (LRB) with comments: TRANSESOPHAGEAL ECHOCARDIOGRAM (TEE) (N/A) CARDIOVERSION (N/A)  Patient Location: Endoscopy Unit  Anesthesia Type: MAC  Level of Consciousness: awake, alert  and oriented  Airway & Oxygen Therapy: Patient Spontanous Breathing and Patient connected to nasal cannula oxygen  Post-op Assessment: Report given to PACU RN, Post -op Vital signs reviewed and stable and Patient moving all extremities  Post vital signs: Reviewed and stable  Complications: No apparent anesthesia complications

## 2011-12-15 ENCOUNTER — Encounter (HOSPITAL_COMMUNITY): Payer: Self-pay | Admitting: Cardiovascular Disease

## 2011-12-22 DIAGNOSIS — L989 Disorder of the skin and subcutaneous tissue, unspecified: Secondary | ICD-10-CM | POA: Diagnosis not present

## 2011-12-22 DIAGNOSIS — L97509 Non-pressure chronic ulcer of other part of unspecified foot with unspecified severity: Secondary | ICD-10-CM | POA: Diagnosis not present

## 2011-12-22 DIAGNOSIS — B372 Candidiasis of skin and nail: Secondary | ICD-10-CM | POA: Diagnosis not present

## 2011-12-23 DIAGNOSIS — I4891 Unspecified atrial fibrillation: Secondary | ICD-10-CM | POA: Diagnosis not present

## 2012-01-05 DIAGNOSIS — L97509 Non-pressure chronic ulcer of other part of unspecified foot with unspecified severity: Secondary | ICD-10-CM | POA: Diagnosis not present

## 2012-01-06 DIAGNOSIS — I4891 Unspecified atrial fibrillation: Secondary | ICD-10-CM | POA: Diagnosis not present

## 2012-01-26 DIAGNOSIS — L97509 Non-pressure chronic ulcer of other part of unspecified foot with unspecified severity: Secondary | ICD-10-CM | POA: Diagnosis not present

## 2012-02-03 DIAGNOSIS — I4891 Unspecified atrial fibrillation: Secondary | ICD-10-CM | POA: Diagnosis not present

## 2012-02-04 ENCOUNTER — Encounter: Payer: Self-pay | Admitting: Orthopedic Surgery

## 2012-02-04 ENCOUNTER — Ambulatory Visit (INDEPENDENT_AMBULATORY_CARE_PROVIDER_SITE_OTHER): Payer: Medicare Other

## 2012-02-04 ENCOUNTER — Ambulatory Visit (INDEPENDENT_AMBULATORY_CARE_PROVIDER_SITE_OTHER): Payer: Medicare Other | Admitting: Orthopedic Surgery

## 2012-02-04 VITALS — BP 160/70 | Ht 70.0 in | Wt 191.0 lb

## 2012-02-04 DIAGNOSIS — M25519 Pain in unspecified shoulder: Secondary | ICD-10-CM

## 2012-02-04 DIAGNOSIS — S46019A Strain of muscle(s) and tendon(s) of the rotator cuff of unspecified shoulder, initial encounter: Secondary | ICD-10-CM | POA: Insufficient documentation

## 2012-02-04 DIAGNOSIS — S43429A Sprain of unspecified rotator cuff capsule, initial encounter: Secondary | ICD-10-CM | POA: Diagnosis not present

## 2012-02-04 NOTE — Patient Instructions (Signed)
You have received a steroid shot. 15% of patients experience increased pain at the injection site with in the next 24 hours. This is best treated with ice and tylenol extra strength 2 tabs every 8 hours. If you are still having pain please call the office.   Rotator Cuff Injury The rotator cuff is the collective set of muscles and tendons that make up the stabilizing unit of your shoulder. This unit holds in the ball of the humerus (upper arm bone) in the socket of the scapula (shoulder blade). Injuries to this stabilizing unit most commonly come from sports or activities that cause the arm to be moved repeatedly over the head. Examples of this include throwing, weight lifting, swimming, racquet sports, or an injury such as falling on your arm. Chronic (longstanding) irritation of this unit can cause inflammation (soreness), bursitis, and eventual damage to the tendons to the point of rupture (tear). An acute (sudden) injury of the rotator cuff can result in a partial or complete tear. You may need surgery with complete tears. Small or partial rotator cuff tears may be treated conservatively with temporary immobilization, exercises and rest. Physical therapy may be needed. HOME CARE INSTRUCTIONS    Apply ice to the injury for 15 to 20 minutes 3 to 4 times per day for the first 2 days. Put the ice in a plastic bag and place a towel between the bag of ice and your skin.   If you have a shoulder immobilizer (sling and straps), do not remove it for as long as directed by your caregiver or until you see a caregiver for a follow-up examination. If you need to remove it, move your arm as little as possible.   You may want to sleep on several pillows or in a recliner at night to lessen swelling and pain.   Only take over-the-counter or prescription medicines for pain, discomfort, or fever as directed by your caregiver.   Do simple hand squeezing exercises with a soft rubber ball to decrease hand swelling.    SEEK MEDICAL CARE IF:    Pain in your shoulder increases or new pain or numbness develops in your arm, hand, or fingers.   Your hand or fingers are colder than your other hand.  SEEK IMMEDIATE MEDICAL CARE IF:    Your arm, hand, or fingers are numb or tingling.   Your arm, hand, or fingers are increasingly swollen and painful, or turn white or blue.  Document Released: 03/21/2000 Document Revised: 06/16/2011 Document Reviewed: 03/14/2008 [facility] Patient Information 2013 [place], [place].

## 2012-02-04 NOTE — Progress Notes (Signed)
[name], male   DOB: Oct 20, 1931, 78 y.o.   MRN: [id number] Chief Complaint  Patient presents with  . Shoulder Pain    6 months anterior shoulder pain    78 year old male very active started having shoulder pain in April 2013, he received.one injection he says over the last 3 days really improved and it may be secondary to taking some dark chocolate  He has a small area of sharp 5/10 intermittent pain over the right anterior deltoid. He has pain when he raises his arm above his shoulder. He was experiencing night pain but that has resolved.  Pain described as sharp  Review of systems chest pain heartburn easy bleeding and bruising he is on Coumadin other systems are negative  Past Medical History  Diagnosis Date  . Hypertension   . Hypercholesterolemia   . Hypothyroidism   . Gout   . BPH (benign prostatic hyperplasia)   . CAD (coronary artery disease) 2009    cardiac cath and PTCA by Dr. Jenne Campus  . Tubular adenoma 2001  . Diverticulosis 2007    tcs by Dr. Karilyn Cota  . GERD (gastroesophageal reflux disease)   . Adenomatous colon polyp 2001  . Paroxysmal a-fib 2012    now on coumadin    BP 160/70  Ht 5\' 10"  (1.778 m)  Wt 191 lb (86.637 kg)  BMI 27.41 kg/m2 Physical Exam  Nursing note and vitals reviewed. Constitutional: He is oriented to person, place, and time. He appears well-developed and well-nourished. No distress.  HENT:  Head: Atraumatic.  Eyes: Right eye exhibits no discharge. Left eye exhibits no discharge.  Neck: Neck supple. No tracheal deviation present.  Cardiovascular: Intact distal pulses.   Lymphadenopathy:    He has no cervical adenopathy.  Neurological: He is alert and oriented to person, place, and time. He has normal reflexes. He displays normal reflexes. No cranial nerve deficit. He exhibits normal muscle tone. Coordination normal.  Psychiatric: He has a normal mood and affect. His behavior is normal. Judgment and thought content  normal.   right shoulder evaluation  No abnormalities in terms of overall appearance he has negative Popeye sign in either arm actually. He has for elevation of 130 abduction of 100 his external rotation 50. Has painful for elevation. He has no apprehension. Rotator cuff strength 5 over 5 skin intact  Impingement sign positive  X-ray shows large anterior acromial peak  Normal glenohumeral joint may have slight proximal migration of the humerus  Impression rotator cuff strain impingement syndrome  He's getting better he wanted to go ahead and get the injections were given injection subacromial space  Followup as needed  Shoulder Injection Procedure Note   Pre-operative Diagnosis: right  RC Syndrome  Post-operative Diagnosis: same  Indications: pain   Anesthesia: ethyl chloride   Procedure Details   Verbal consent was obtained for the procedure. The shoulder was prepped withalcohol and the skin was anesthetized. A 20 gauge needle was advanced into the subacromial space through posterior approach without difficulty  The space was then injected with 3 ml 1% lidocaine and 1 ml of depomedrol. The injection site was cleansed with isopropyl alcohol and a dressing was applied.  Complications:  None; patient tolerated the procedure well.

## 2012-02-16 DIAGNOSIS — L97509 Non-pressure chronic ulcer of other part of unspecified foot with unspecified severity: Secondary | ICD-10-CM | POA: Diagnosis not present

## 2012-02-24 DIAGNOSIS — I4891 Unspecified atrial fibrillation: Secondary | ICD-10-CM | POA: Diagnosis not present

## 2012-03-10 DIAGNOSIS — I10 Essential (primary) hypertension: Secondary | ICD-10-CM | POA: Diagnosis not present

## 2012-03-10 DIAGNOSIS — I4891 Unspecified atrial fibrillation: Secondary | ICD-10-CM | POA: Diagnosis not present

## 2012-03-11 DIAGNOSIS — R5381 Other malaise: Secondary | ICD-10-CM | POA: Diagnosis not present

## 2012-03-11 DIAGNOSIS — R5383 Other fatigue: Secondary | ICD-10-CM | POA: Diagnosis not present

## 2012-03-11 DIAGNOSIS — Z79899 Other long term (current) drug therapy: Secondary | ICD-10-CM | POA: Diagnosis not present

## 2012-03-18 DIAGNOSIS — I4891 Unspecified atrial fibrillation: Secondary | ICD-10-CM | POA: Diagnosis not present

## 2012-03-18 DIAGNOSIS — Z79899 Other long term (current) drug therapy: Secondary | ICD-10-CM | POA: Diagnosis not present

## 2012-03-18 DIAGNOSIS — I10 Essential (primary) hypertension: Secondary | ICD-10-CM | POA: Diagnosis not present

## 2012-03-18 DIAGNOSIS — E785 Hyperlipidemia, unspecified: Secondary | ICD-10-CM | POA: Diagnosis not present

## 2012-03-18 DIAGNOSIS — M719 Bursopathy, unspecified: Secondary | ICD-10-CM | POA: Diagnosis not present

## 2012-03-18 DIAGNOSIS — E039 Hypothyroidism, unspecified: Secondary | ICD-10-CM | POA: Diagnosis not present

## 2012-03-22 DIAGNOSIS — L97509 Non-pressure chronic ulcer of other part of unspecified foot with unspecified severity: Secondary | ICD-10-CM | POA: Diagnosis not present

## 2012-04-05 DIAGNOSIS — I4891 Unspecified atrial fibrillation: Secondary | ICD-10-CM | POA: Diagnosis not present

## 2012-04-21 DIAGNOSIS — I4891 Unspecified atrial fibrillation: Secondary | ICD-10-CM | POA: Diagnosis not present

## 2012-05-03 DIAGNOSIS — I4891 Unspecified atrial fibrillation: Secondary | ICD-10-CM | POA: Diagnosis not present

## 2012-05-17 DIAGNOSIS — L97509 Non-pressure chronic ulcer of other part of unspecified foot with unspecified severity: Secondary | ICD-10-CM | POA: Diagnosis not present

## 2012-05-31 DIAGNOSIS — I4891 Unspecified atrial fibrillation: Secondary | ICD-10-CM | POA: Diagnosis not present

## 2012-06-15 ENCOUNTER — Ambulatory Visit: Payer: Self-pay | Admitting: Internal Medicine

## 2012-06-15 DIAGNOSIS — Z7901 Long term (current) use of anticoagulants: Secondary | ICD-10-CM | POA: Insufficient documentation

## 2012-06-15 DIAGNOSIS — I4891 Unspecified atrial fibrillation: Secondary | ICD-10-CM | POA: Insufficient documentation

## 2012-07-01 DIAGNOSIS — I4891 Unspecified atrial fibrillation: Secondary | ICD-10-CM | POA: Diagnosis not present

## 2012-07-08 DIAGNOSIS — H5231 Anisometropia: Secondary | ICD-10-CM | POA: Diagnosis not present

## 2012-07-08 DIAGNOSIS — H40009 Preglaucoma, unspecified, unspecified eye: Secondary | ICD-10-CM | POA: Diagnosis not present

## 2012-07-08 DIAGNOSIS — H524 Presbyopia: Secondary | ICD-10-CM | POA: Diagnosis not present

## 2012-07-08 DIAGNOSIS — H52229 Regular astigmatism, unspecified eye: Secondary | ICD-10-CM | POA: Diagnosis not present

## 2012-07-28 DIAGNOSIS — H40009 Preglaucoma, unspecified, unspecified eye: Secondary | ICD-10-CM | POA: Diagnosis not present

## 2012-07-28 DIAGNOSIS — I4891 Unspecified atrial fibrillation: Secondary | ICD-10-CM | POA: Diagnosis not present

## 2012-08-11 ENCOUNTER — Encounter: Payer: Self-pay | Admitting: Internal Medicine

## 2012-08-25 ENCOUNTER — Ambulatory Visit (INDEPENDENT_AMBULATORY_CARE_PROVIDER_SITE_OTHER): Payer: Medicare Other | Admitting: Pharmacist Clinician (PhC)/ Clinical Pharmacy Specialist

## 2012-08-25 DIAGNOSIS — I4891 Unspecified atrial fibrillation: Secondary | ICD-10-CM | POA: Diagnosis not present

## 2012-08-25 DIAGNOSIS — Z7901 Long term (current) use of anticoagulants: Secondary | ICD-10-CM

## 2012-08-27 ENCOUNTER — Emergency Department (HOSPITAL_COMMUNITY)
Admission: EM | Admit: 2012-08-27 | Discharge: 2012-08-27 | Disposition: A | Discharge status: Home / Self Care | Payer: Medicare Other | Attending: Emergency Medicine | Admitting: Emergency Medicine

## 2012-08-27 ENCOUNTER — Encounter (HOSPITAL_COMMUNITY): Payer: Self-pay | Admitting: *Deleted

## 2012-08-27 ENCOUNTER — Emergency Department (HOSPITAL_COMMUNITY): Payer: Medicare Other

## 2012-08-27 DIAGNOSIS — M25476 Effusion, unspecified foot: Secondary | ICD-10-CM | POA: Diagnosis not present

## 2012-08-27 DIAGNOSIS — Z860100 Personal history of colon polyps, unspecified: Secondary | ICD-10-CM | POA: Insufficient documentation

## 2012-08-27 DIAGNOSIS — Z862 Personal history of diseases of the blood and blood-forming organs and certain disorders involving the immune mechanism: Secondary | ICD-10-CM | POA: Diagnosis not present

## 2012-08-27 DIAGNOSIS — N400 Enlarged prostate without lower urinary tract symptoms: Secondary | ICD-10-CM | POA: Insufficient documentation

## 2012-08-27 DIAGNOSIS — Z7901 Long term (current) use of anticoagulants: Secondary | ICD-10-CM | POA: Diagnosis not present

## 2012-08-27 DIAGNOSIS — K219 Gastro-esophageal reflux disease without esophagitis: Secondary | ICD-10-CM | POA: Diagnosis not present

## 2012-08-27 DIAGNOSIS — I2510 Atherosclerotic heart disease of native coronary artery without angina pectoris: Secondary | ICD-10-CM | POA: Insufficient documentation

## 2012-08-27 DIAGNOSIS — I4891 Unspecified atrial fibrillation: Secondary | ICD-10-CM | POA: Diagnosis not present

## 2012-08-27 DIAGNOSIS — Z7982 Long term (current) use of aspirin: Secondary | ICD-10-CM | POA: Diagnosis not present

## 2012-08-27 DIAGNOSIS — Z9889 Other specified postprocedural states: Secondary | ICD-10-CM | POA: Diagnosis not present

## 2012-08-27 DIAGNOSIS — M7730 Calcaneal spur, unspecified foot: Secondary | ICD-10-CM | POA: Diagnosis not present

## 2012-08-27 DIAGNOSIS — Z880 Allergy status to penicillin: Secondary | ICD-10-CM | POA: Insufficient documentation

## 2012-08-27 DIAGNOSIS — E7800 Pure hypercholesterolemia, unspecified: Secondary | ICD-10-CM | POA: Insufficient documentation

## 2012-08-27 DIAGNOSIS — Z8719 Personal history of other diseases of the digestive system: Secondary | ICD-10-CM | POA: Insufficient documentation

## 2012-08-27 DIAGNOSIS — Z8639 Personal history of other endocrine, nutritional and metabolic disease: Secondary | ICD-10-CM | POA: Insufficient documentation

## 2012-08-27 DIAGNOSIS — Z79899 Other long term (current) drug therapy: Secondary | ICD-10-CM | POA: Diagnosis not present

## 2012-08-27 DIAGNOSIS — I10 Essential (primary) hypertension: Secondary | ICD-10-CM | POA: Insufficient documentation

## 2012-08-27 DIAGNOSIS — M25473 Effusion, unspecified ankle: Secondary | ICD-10-CM | POA: Diagnosis not present

## 2012-08-27 DIAGNOSIS — E039 Hypothyroidism, unspecified: Secondary | ICD-10-CM | POA: Insufficient documentation

## 2012-08-27 MED ORDER — OXYCODONE-ACETAMINOPHEN 5-325 MG PO TABS: 1.0000 | ORAL_TABLET | Freq: Three times a day (TID) | ORAL | Status: DC | PRN

## 2012-08-27 NOTE — ED Provider Notes (Signed)
History    This chart was scribed for Jerry Gaskins, MD by Donne Anon, ED Scribe. This patient was seen in room APA12/APA12 and the patient's care was started at 2304.   CSN: [id number]  Arrival date & time 08/27/12  2149   First MD Initiated Contact with Patient 08/27/12 2304      Chief Complaint  Patient presents with  . Ankle Pain     Patient is a 79 y.o. male presenting with ankle pain. The history is provided by the patient. No language interpreter was used.  Ankle Pain Location:  Ankle Time since incident:  6 hours Injury: no   Ankle location:  R ankle Pain details:    Radiates to:  Does not radiate   Severity:  Moderate   Onset quality:  Sudden   Timing:  Constant   Progression:  Worsening Chronicity:  New Dislocation: no   Foreign body present:  No foreign bodies Prior injury to area:  No Relieved by:  Nothing Worsened by:  Bearing weight Ineffective treatments:  None tried Associated symptoms: swelling   Associated symptoms: no back pain, no fever and no numbness   Risk factors: no concern for non-accidental trauma, no frequent fractures, no known bone disorder and no recent illness    HPI Comments: Jerry Cain is a 79 y.o. male who presents to the Emergency Department complaining of sudden onset, gradually worsening moderate right ankle and foot pain that began this afternoon while he was in his garden. He reports associated swelling as well as bruising to his 4th and 5th toes. He denies any recent trauma or animal/insect bite to the area. He reports he was wearing shoes while he was in the garden. He denies a history of diabetes, blood clots, long car rides or recent surgery. He reports his only significant surgical history as a stent placed in 2009.  Past Medical History  Diagnosis Date  . Hypertension   . Hypercholesterolemia   . Hypothyroidism   . Gout   . BPH (benign prostatic hyperplasia)   . CAD (coronary artery disease) 2009    cardiac  cath and PTCA by Dr. Jenne Campus  . Tubular adenoma 2001  . Diverticulosis 2007    tcs by Dr. Karilyn Cota  . GERD (gastroesophageal reflux disease)   . Adenomatous colon polyp 2001  . Paroxysmal a-fib 2012    now on coumadin    Past Surgical History  Procedure Laterality Date  . Hernia repair  2009    left inguinal  . Cataract extraction      bilateral  . Cardiac catheterization  06/11/2007    PTCA X2 in 06/2007:  2 overlapping Cypher stents to LAD (2.5x13 and 3.0x23)  . Colonoscopy  2007    Dr. Karilyn Cota- L sided diverticulosis. Next TCS 2012 due to h/o tubular adenoma.  . Esophagogastroduodenoscopy  07/2007    Dr. Elmer Ramp  . Colonoscopy  02/26/2011    Procedure: COLONOSCOPY;  Surgeon: Corbin Ade, MD;  Location: [facility];  Service: Endoscopy;  Laterality: N/A;  11:05  . Tee without cardioversion  12/12/2011    Procedure: TRANSESOPHAGEAL ECHOCARDIOGRAM (TEE);  Surgeon: Thurmon Fair, MD;  Location: [facility] ENDOSCOPY;  Service: Cardiovascular;  Laterality: N/A;   successful/sinus rhythm  . Cardioversion  12/12/2011    Procedure: CARDIOVERSION;  Surgeon: Thurmon Fair, MD;  Location: [facility] ENDOSCOPY;  Service: Cardiovascular;  Laterality: N/A;  . Cardiac catheterization  06/21/2007    Cypher stent 2.5 x 13  to OM  branch  . Stents      Family History  Problem Relation Age of Onset  . Colon cancer Neg Hx   . Pneumonia Father   . Heart disease Brother     open heart surgery at age 67    History  Substance Use Topics  . Smoking status: Never Smoker   . Smokeless tobacco: Not on file  . Alcohol Use: No      Review of Systems  Constitutional: Negative for fever.  Respiratory: Negative for shortness of breath.   Cardiovascular: Negative for chest pain.  Musculoskeletal: Negative for back pain.  Neurological: Negative for numbness.  All other systems reviewed and are negative.    Allergies  Penicillins  Home Medications   Current Outpatient Rx  Name  Route  Sig   Dispense  Refill  . aspirin 81 MG tablet   Oral   Take 81 mg by mouth daily.           Marland Kitchen atorvastatin (LIPITOR) 40 MG tablet   Oral   Take 40 mg by mouth daily.         Marland Kitchen BYSTOLIC 5 MG tablet   Oral   Take 5 mg by mouth daily. Takes 1/2 tablet         . Calcium Carbonate-Vitamin D (CALCIUM + D) 600-200 MG-UNIT TABS   Oral   Take by mouth.           . finasteride (PROSCAR) 5 MG tablet   Oral   Take 5 mg by mouth daily.          . isosorbide mononitrate (IMDUR) 30 MG 24 hr tablet   Oral   Take 30 mg by mouth daily.          Marland Kitchen LEVOXYL 88 MCG tablet   Oral   Take 88 mcg by mouth daily.          Marland Kitchen lisinopril (PRINIVIL,ZESTRIL) 20 MG tablet   Oral   Take 20 mg by mouth daily.           . Multiple Vitamin (MULTIVITAMIN) capsule   Oral   Take 1 capsule by mouth daily.           . psyllium (METAMUCIL) 58.6 % powder   Oral   Take 1 packet by mouth daily. (3 teaspoonsful once daily at bedtime)         . simvastatin (ZOCOR) 20 MG tablet   Oral   Take 20 mg by mouth at bedtime.           Marland Kitchen warfarin (COUMADIN) 5 MG tablet   Oral   Take 2.5-5 mg by mouth daily. Takes one-half tablet on FRIDAYS ONLY         . NITROSTAT 0.4 MG SL tablet   Sublingual   Place 0.4 mg under the tongue every 5 (five) minutes as needed.            BP 173/73  Pulse 60  Temp(Src) 97 F (36.1 C) (Oral)  Resp 14  Ht 6' (1.829 m)  Wt 175 lb (79.379 kg)  BMI 23.73 kg/m2  SpO2 99%  Physical Exam CONSTITUTIONAL: Well developed/well nourished HEAD: Normocephalic/atraumatic EYES: EOMI/PERRL ENMT: Mucous membranes moist NECK: supple no meningeal signs SPINE:entire spine nontender CV: S1/S2 noted, no murmurs/rubs/gallops noted LUNGS: Lungs are clear to auscultation bilaterally, no apparent distress ABDOMEN: soft, nontender, no rebound or guarding GU:no cva tenderness NEURO: Pt is awake/alert, moves all extremitiesx4 EXTREMITIES: pulses normal, full ROM,  right foot  swelling and tenderness noted, bruising on the lateral aspect and on plantar surface of foot, no puncture wounds or bites noted, pulses are equal and brisk, foot is warm, no calf tenderness There is no induration or firmness to the foot.   SKIN: warm, color normal PSYCH: no abnormalities of mood noted  ED Course  Procedures DIAGNOSTIC STUDIES: Oxygen Saturation is 99% on room air, normal by my interpretation.    COORDINATION OF CARE: 11:18 PM Discussed treatment plan which includes rest, pain medication, and post op boot with pt at bedside and pt agreed to plan. Discussed why I believe it is unlikely to be a blood clot. Return precautions advised. Advised pt to follow up with PCP or orthopedist. He is on coumadin and recent INR therapeutic.  Suspect he may have spontaneous bleed into his foot that has resolved.  He is in no distress.  No signs of hemarthrosis to ankle. No calf tenderness/bruising noted  I spoke about strict return precautions including worsened pain, numbness/weakness to the foot.     Labs Reviewed - No data to display Dg Ankle Complete Right  08/27/2012   *RADIOLOGY REPORT*  Clinical Data: Right ankle pain.  No known injury.  RIGHT ANKLE - COMPLETE 3+ VIEW  Comparison: None.  Findings: Diffuse soft tissue swelling.  No bone destruction or periosteal reaction.  No fracture or dislocation.  No soft tissue gas.  Mild calcaneal spur formation.  IMPRESSION: Diffuse soft tissue swelling without underlying bony abnormality.   Original Report Authenticated By: Beckie Salts, M.D.      MDM  Nursing notes including past medical history and social history reviewed and considered in documentation xrays reviewed and considered    I personally performed the services described in this documentation, which was scribed in my presence. The recorded information has been reviewed and is accurate.         Jerry Gaskins, MD 08/28/12 [phone]

## 2012-08-27 NOTE — ED Notes (Signed)
Pt states he was in the garden earlier and states he is now hurting in his ankle. Pt also has bruising in between 4th and 5th toe (pinky toe and 4th toe.)

## 2012-09-03 DIAGNOSIS — M21969 Unspecified acquired deformity of unspecified lower leg: Secondary | ICD-10-CM | POA: Diagnosis not present

## 2012-09-03 DIAGNOSIS — L989 Disorder of the skin and subcutaneous tissue, unspecified: Secondary | ICD-10-CM | POA: Diagnosis not present

## 2012-09-03 DIAGNOSIS — B372 Candidiasis of skin and nail: Secondary | ICD-10-CM | POA: Diagnosis not present

## 2012-09-06 ENCOUNTER — Other Ambulatory Visit (HOSPITAL_COMMUNITY): Payer: Self-pay | Admitting: *Deleted

## 2012-09-06 DIAGNOSIS — M21961 Unspecified acquired deformity of right lower leg: Secondary | ICD-10-CM

## 2012-09-08 ENCOUNTER — Ambulatory Visit: Payer: Medicare Other | Admitting: Cardiovascular Disease

## 2012-09-10 ENCOUNTER — Encounter (HOSPITAL_COMMUNITY): Payer: Self-pay

## 2012-09-10 ENCOUNTER — Ambulatory Visit (HOSPITAL_COMMUNITY)
Admission: RE | Admit: 2012-09-10 | Discharge: 2012-09-10 | Disposition: A | Discharge status: Home / Self Care | Payer: Medicare Other | Source: Ambulatory Visit | Attending: *Deleted | Admitting: *Deleted

## 2012-09-10 DIAGNOSIS — M659 Synovitis and tenosynovitis, unspecified: Secondary | ICD-10-CM | POA: Diagnosis not present

## 2012-09-10 DIAGNOSIS — M21961 Unspecified acquired deformity of right lower leg: Secondary | ICD-10-CM

## 2012-09-10 DIAGNOSIS — M79609 Pain in unspecified limb: Secondary | ICD-10-CM | POA: Insufficient documentation

## 2012-09-10 DIAGNOSIS — M216X9 Other acquired deformities of unspecified foot: Secondary | ICD-10-CM | POA: Insufficient documentation

## 2012-09-10 DIAGNOSIS — M249 Joint derangement, unspecified: Secondary | ICD-10-CM | POA: Insufficient documentation

## 2012-09-10 DIAGNOSIS — M24176 Other articular cartilage disorders, unspecified foot: Secondary | ICD-10-CM | POA: Insufficient documentation

## 2012-09-10 DIAGNOSIS — M65979 Unspecified synovitis and tenosynovitis, unspecified ankle and foot: Secondary | ICD-10-CM | POA: Insufficient documentation

## 2012-09-15 DIAGNOSIS — B353 Tinea pedis: Secondary | ICD-10-CM | POA: Diagnosis not present

## 2012-09-15 DIAGNOSIS — L84 Corns and callosities: Secondary | ICD-10-CM | POA: Diagnosis not present

## 2012-09-16 DIAGNOSIS — E039 Hypothyroidism, unspecified: Secondary | ICD-10-CM | POA: Diagnosis not present

## 2012-09-16 DIAGNOSIS — Z79899 Other long term (current) drug therapy: Secondary | ICD-10-CM | POA: Diagnosis not present

## 2012-09-16 DIAGNOSIS — E785 Hyperlipidemia, unspecified: Secondary | ICD-10-CM | POA: Diagnosis not present

## 2012-09-16 DIAGNOSIS — I10 Essential (primary) hypertension: Secondary | ICD-10-CM | POA: Diagnosis not present

## 2012-09-20 ENCOUNTER — Ambulatory Visit: Payer: Medicare Other | Admitting: Cardiovascular Disease

## 2012-09-22 ENCOUNTER — Ambulatory Visit (INDEPENDENT_AMBULATORY_CARE_PROVIDER_SITE_OTHER): Payer: Medicare Other | Admitting: Cardiovascular Disease

## 2012-09-22 ENCOUNTER — Encounter: Payer: Self-pay | Admitting: Cardiovascular Disease

## 2012-09-22 ENCOUNTER — Ambulatory Visit (INDEPENDENT_AMBULATORY_CARE_PROVIDER_SITE_OTHER): Payer: Medicare Other | Admitting: Pharmacist Clinician (PhC)/ Clinical Pharmacy Specialist

## 2012-09-22 VITALS — BP 144/80 | HR 59 | Resp 16 | Ht 72.0 in | Wt 197.5 lb

## 2012-09-22 DIAGNOSIS — I2510 Atherosclerotic heart disease of native coronary artery without angina pectoris: Secondary | ICD-10-CM

## 2012-09-22 DIAGNOSIS — E785 Hyperlipidemia, unspecified: Secondary | ICD-10-CM

## 2012-09-22 DIAGNOSIS — I4891 Unspecified atrial fibrillation: Secondary | ICD-10-CM

## 2012-09-22 DIAGNOSIS — Z7901 Long term (current) use of anticoagulants: Secondary | ICD-10-CM

## 2012-09-22 DIAGNOSIS — I10 Essential (primary) hypertension: Secondary | ICD-10-CM | POA: Diagnosis not present

## 2012-09-22 LAB — POCT INR: INR: 2.3

## 2012-09-22 NOTE — Patient Instructions (Addendum)
No changes to your medicines today.  We will see you back in 1 year for a follow up visit.  Please call if you need anything before then.

## 2012-09-23 ENCOUNTER — Encounter (HOSPITAL_COMMUNITY): Payer: Self-pay | Admitting: Emergency Medicine

## 2012-09-23 ENCOUNTER — Emergency Department (HOSPITAL_COMMUNITY)
Admission: EM | Admit: 2012-09-23 | Discharge: 2012-09-23 | Disposition: A | Discharge status: Home / Self Care | Payer: Medicare Other | Attending: Emergency Medicine | Admitting: Emergency Medicine

## 2012-09-23 DIAGNOSIS — Z8719 Personal history of other diseases of the digestive system: Secondary | ICD-10-CM | POA: Diagnosis not present

## 2012-09-23 DIAGNOSIS — I4891 Unspecified atrial fibrillation: Secondary | ICD-10-CM | POA: Diagnosis not present

## 2012-09-23 DIAGNOSIS — Z860100 Personal history of colon polyps, unspecified: Secondary | ICD-10-CM | POA: Insufficient documentation

## 2012-09-23 DIAGNOSIS — I2510 Atherosclerotic heart disease of native coronary artery without angina pectoris: Secondary | ICD-10-CM | POA: Diagnosis not present

## 2012-09-23 DIAGNOSIS — Z7901 Long term (current) use of anticoagulants: Secondary | ICD-10-CM | POA: Insufficient documentation

## 2012-09-23 DIAGNOSIS — Z7982 Long term (current) use of aspirin: Secondary | ICD-10-CM | POA: Diagnosis not present

## 2012-09-23 DIAGNOSIS — Z862 Personal history of diseases of the blood and blood-forming organs and certain disorders involving the immune mechanism: Secondary | ICD-10-CM | POA: Insufficient documentation

## 2012-09-23 DIAGNOSIS — Z79899 Other long term (current) drug therapy: Secondary | ICD-10-CM | POA: Diagnosis not present

## 2012-09-23 DIAGNOSIS — Z880 Allergy status to penicillin: Secondary | ICD-10-CM | POA: Diagnosis not present

## 2012-09-23 DIAGNOSIS — E7800 Pure hypercholesterolemia, unspecified: Secondary | ICD-10-CM | POA: Insufficient documentation

## 2012-09-23 DIAGNOSIS — K5730 Diverticulosis of large intestine without perforation or abscess without bleeding: Secondary | ICD-10-CM | POA: Diagnosis not present

## 2012-09-23 DIAGNOSIS — I10 Essential (primary) hypertension: Secondary | ICD-10-CM | POA: Diagnosis not present

## 2012-09-23 DIAGNOSIS — Z87448 Personal history of other diseases of urinary system: Secondary | ICD-10-CM | POA: Insufficient documentation

## 2012-09-23 DIAGNOSIS — Z8639 Personal history of other endocrine, nutritional and metabolic disease: Secondary | ICD-10-CM | POA: Insufficient documentation

## 2012-09-23 NOTE — ED Provider Notes (Signed)
History     CSN: [id number]  Arrival date & time 09/23/12  0548   First MD Initiated Contact with Patient 09/23/12 [phone]      Chief Complaint  Patient presents with  . Hypertension     Patient is a 79 y.o. male presenting with hypertension. The history is provided by the patient.  Hypertension This is a recurrent problem. Episode onset: just prior to arrival. The problem occurs constantly. The problem has been gradually improving. Pertinent negatives include no chest pain, no abdominal pain, no headaches and no shortness of breath. Nothing aggravates the symptoms. Nothing relieves the symptoms.    Past Medical History  Diagnosis Date  . Hypertension   . Hypercholesterolemia   . Hypothyroidism   . Gout   . BPH (benign prostatic hyperplasia)   . CAD (coronary artery disease) 2009    cardiac cath and PTCA by Dr. Jenne Campus  . Tubular adenoma 2001  . Diverticulosis 2007    tcs by Dr. Karilyn Cota  . GERD (gastroesophageal reflux disease)   . Adenomatous colon polyp 2001  . Paroxysmal a-fib 2012    now on coumadin    Past Surgical History  Procedure Laterality Date  . Hernia repair  2009    left inguinal  . Cataract extraction      bilateral  . Cardiac catheterization  06/11/2007    PTCA X2 in 06/2007:  2 overlapping Cypher stents to LAD (2.5x13 and 3.0x23)  . Colonoscopy  2007    Dr. Karilyn Cota- L sided diverticulosis. Next TCS 2012 due to h/o tubular adenoma.  . Esophagogastroduodenoscopy  07/2007    Dr. Elmer Ramp  . Colonoscopy  02/26/2011    Procedure: COLONOSCOPY;  Surgeon: Corbin Ade, MD;  Location: [facility];  Service: Endoscopy;  Laterality: N/A;  11:05  . Tee without cardioversion  12/12/2011    Procedure: TRANSESOPHAGEAL ECHOCARDIOGRAM (TEE);  Surgeon: Thurmon Fair, MD;  Location: [facility] ENDOSCOPY;  Service: Cardiovascular;  Laterality: N/A;   successful/sinus rhythm  . Cardioversion  12/12/2011    Procedure: CARDIOVERSION;  Surgeon: Thurmon Fair, MD;  Location: [facility]  ENDOSCOPY;  Service: Cardiovascular;  Laterality: N/A;  . Cardiac catheterization  06/21/2007    Cypher stent 2.5 x 13  to OM branch  . Stents      Family History  Problem Relation Age of Onset  . Colon cancer Neg Hx   . Pneumonia Father   . Heart disease Brother     open heart surgery at age 60    History  Substance Use Topics  . Smoking status: Never Smoker   . Smokeless tobacco: Not on file  . Alcohol Use: No      Review of Systems  Respiratory: Negative for shortness of breath.   Cardiovascular: Negative for chest pain.  Gastrointestinal: Negative for abdominal pain.  Neurological: Negative for dizziness, weakness and headaches.    Allergies  Penicillins  Home Medications   Current Outpatient Rx  Name  Route  Sig  Dispense  Refill  . aspirin 81 MG tablet   Oral   Take 81 mg by mouth daily.           Marland Kitchen atorvastatin (LIPITOR) 40 MG tablet   Oral   Take 40 mg by mouth daily.         Marland Kitchen BYSTOLIC 5 MG tablet   Oral   Take 5 mg by mouth daily. Takes 1/2 tablet         . Calcium Carbonate-Vitamin D (CALCIUM +  D) 600-200 MG-UNIT TABS   Oral   Take by mouth.           . finasteride (PROSCAR) 5 MG tablet   Oral   Take 5 mg by mouth daily.          . isosorbide mononitrate (IMDUR) 30 MG 24 hr tablet   Oral   Take 30 mg by mouth daily.          Marland Kitchen ketoconazole (NIZORAL) 2 % cream   Topical   Apply 1 application topically 2 (two) times daily.          Marland Kitchen LEVOXYL 88 MCG tablet   Oral   Take 88 mcg by mouth daily.          Marland Kitchen lisinopril (PRINIVIL,ZESTRIL) 20 MG tablet   Oral   Take 20 mg by mouth daily.           . Multiple Vitamin (MULTIVITAMIN) capsule   Oral   Take 1 capsule by mouth daily.           . psyllium (METAMUCIL) 58.6 % powder   Oral   Take 1 packet by mouth daily. (3 teaspoonsful once daily at bedtime)         . warfarin (COUMADIN) 5 MG tablet   Oral   Take 2.5-5 mg by mouth daily. Takes one-half tablet on FRIDAYS  ONLY         . NITROSTAT 0.4 MG SL tablet   Sublingual   Place 0.4 mg under the tongue every 5 (five) minutes as needed.            BP 151/66  Pulse 56  Temp(Src) 97.7 F (36.5 C) (Oral)  Resp 16  Ht 5\' 11"  (1.803 m)  Wt 180 lb (81.647 kg)  BMI 25.12 kg/m2  SpO2 97%  Physical Exam CONSTITUTIONAL: Well developed/well nourished HEAD: Normocephalic/atraumatic EYES: EOMI ENMT: Mucous membranes moist NECK: supple no meningeal signs CV: S1/S2 noted, no murmurs/rubs/gallops noted LUNGS: Lungs are clear to auscultation bilaterally, no apparent distress ABDOMEN: soft, nontender, no rebound or guarding NEURO: Pt is awake/alert, moves all extremitiesx4 EXTREMITIES: pulses normal, full ROM SKIN: warm, color normal PSYCH: no abnormalities of mood noted  ED Course  Procedures   1. HTN (hypertension)       MDM  Nursing notes including past medical history and social history reviewed and considered in documentation  Pt came to the ED for Blood pressure check only.  Once he saw his readings he wanted to go home   He denied any new symptoms - denies HA/CP/SOB/Weakness He reports med compliance but his cardiologist did change the timing of his BP meds recently         Joya Gaskins, MD 09/23/12 [phone]

## 2012-09-23 NOTE — ED Notes (Signed)
Patient reports noticed blood pressure was running high this morning (220/106). Reports has taken normal blood pressure medication since. Denies any pain or symptoms.

## 2012-09-25 DIAGNOSIS — I10 Essential (primary) hypertension: Secondary | ICD-10-CM | POA: Insufficient documentation

## 2012-09-25 DIAGNOSIS — E7800 Pure hypercholesterolemia, unspecified: Secondary | ICD-10-CM | POA: Insufficient documentation

## 2012-09-25 NOTE — Progress Notes (Signed)
[name], male   DOB: 16-Sep-1931, 79 y.o.   MRN: [id number]     Reason for office visit Paroxysmal atrial fibrillation, hypertension, hyperlipidemia  Jerry Cain is a very active and fit gentleman who has recurrent paroxysmal atrial fibrillation that has never required cardioversion or direct antiarrhythmic therapy. In the 6 months since his last appointment he thinks has had atrial fibrillation once. This occurred about 2 weeks ago after he had been working in the yard. It only lasted for about 20 minutes.  He continues to swim and exercise at the gym even though he has some discomfort from a rotator cuff problem. He is physically very fit for his age.  He has noticed that his blood pressure tends to be high in the morning although it promptly goes down just 30 minutes later and his completely normal after he takes his medications. For example this morning his blood pressure is 186/80 when he awoke about 30 minutes there was 145/73 and 1 after taking his medications it was 122/66.    Allergies  Allergen Reactions  . Penicillins Other (See Comments)    Does not know     Current Outpatient Prescriptions  Medication Sig Dispense Refill  . aspirin 81 MG tablet Take 81 mg by mouth daily.        Marland Kitchen atorvastatin (LIPITOR) 40 MG tablet Take 40 mg by mouth daily.      Marland Kitchen BYSTOLIC 5 MG tablet Take 5 mg by mouth daily. Takes 1/2 tablet      . Calcium Carbonate-Vitamin D (CALCIUM + D) 600-200 MG-UNIT TABS Take by mouth.        . finasteride (PROSCAR) 5 MG tablet Take 5 mg by mouth daily.       . isosorbide mononitrate (IMDUR) 30 MG 24 hr tablet Take 30 mg by mouth daily.       Marland Kitchen ketoconazole (NIZORAL) 2 % cream Apply 1 application topically 2 (two) times daily.       Marland Kitchen LEVOXYL 88 MCG tablet Take 88 mcg by mouth daily.       Marland Kitchen lisinopril (PRINIVIL,ZESTRIL) 20 MG tablet Take 20 mg by mouth daily.        . Multiple Vitamin (MULTIVITAMIN) capsule Take 1 capsule by mouth  daily.        Marland Kitchen NITROSTAT 0.4 MG SL tablet Place 0.4 mg under the tongue every 5 (five) minutes as needed.       . psyllium (METAMUCIL) 58.6 % powder Take 1 packet by mouth daily. (3 teaspoonsful once daily at bedtime)      . warfarin (COUMADIN) 5 MG tablet Take 2.5-5 mg by mouth daily. Takes one-half tablet on FRIDAYS ONLY       No current facility-administered medications for this visit.    Past Medical History  Diagnosis Date  . Hypertension   . Hypercholesterolemia   . Hypothyroidism   . Gout   . BPH (benign prostatic hyperplasia)   . CAD (coronary artery disease) 2009    cardiac cath and PTCA by Dr. Jenne Campus  . Tubular adenoma 2001  . Diverticulosis 2007    tcs by Dr. Karilyn Cota  . GERD (gastroesophageal reflux disease)   . Adenomatous colon polyp 2001  . Paroxysmal a-fib 2012    now on coumadin    Past Surgical History  Procedure Laterality Date  . Hernia repair  2009    left inguinal  . Cataract extraction      bilateral  . Cardiac  catheterization  06/11/2007    PTCA X2 in 06/2007:  2 overlapping Cypher stents to LAD (2.5x13 and 3.0x23)  . Colonoscopy  2007    Dr. Karilyn Cota- L sided diverticulosis. Next TCS 2012 due to h/o tubular adenoma.  . Esophagogastroduodenoscopy  07/2007    Dr. Elmer Ramp  . Colonoscopy  02/26/2011    Procedure: COLONOSCOPY;  Surgeon: Corbin Ade, MD;  [facility];  Service: Endoscopy;  Laterality: N/A;  11:05  . Tee without cardioversion  12/12/2011    Procedure: TRANSESOPHAGEAL ECHOCARDIOGRAM (TEE);  Surgeon: Thurmon Fair, MD;  Location: [facility] ENDOSCOPY;  Service: Cardiovascular;  Laterality: N/A;   successful/sinus rhythm  . Cardioversion  12/12/2011    Procedure: CARDIOVERSION;  Surgeon: Thurmon Fair, MD;  Location: [facility] ENDOSCOPY;  Service: Cardiovascular;  Laterality: N/A;  . Cardiac catheterization  06/21/2007    Cypher stent 2.5 x 13  to OM branch  . Stents      Family History  Problem Relation Age of Onset  . Colon cancer  Neg Hx   . Pneumonia Father   . Heart disease Brother     open heart surgery at age 43    History   Social History  . Marital Status: Married    Spouse Name: N/A    Number of Children: 4  . Years of Education: N/A   Occupational History  . retired    Social History Main Topics  . Smoking status: Never Smoker   . Smokeless tobacco: Not on file  . Alcohol Use: No  . Drug Use: No  . Sexually Active: Not on file   Other Topics Concern  . Not on file   Social History Narrative  . No narrative on file    Review of systems: The patient specifically denies any chest pain at rest or with exertion, dyspnea at rest or with exertion, orthopnea, paroxysmal nocturnal dyspnea, syncope, rare palpitations, focal neurological deficits, intermittent claudication, lower extremity edema, unexplained weight gain, cough, hemoptysis or wheezing.  The patient also denies abdominal pain, nausea, vomiting, dysphagia, diarrhea, constipation, polyuria, polydipsia, dysuria, hematuria, frequency, urgency, abnormal bleeding or bruising, fever, chills, unexpected weight changes, mood swings, change in skin or hair texture, change in voice quality, auditory or visual problems, allergic reactions or rashes, new musculoskeletal complaints other than usual "aches and pains".   PHYSICAL EXAM BP 144/80  Pulse 59  Resp 16  Ht 6' (1.829 m)  Wt 197 lb 8 oz (89.585 kg)  BMI 26.78 kg/m2  General: Alert, oriented x3, no distress Head: no evidence of trauma, PERRL, EOMI, no exophtalmos or lid lag, no myxedema, no xanthelasma; normal ears, nose and oropharynx Neck: normal jugular venous pulsations and no hepatojugular reflux; brisk carotid pulses without delay and no carotid bruits Chest: clear to auscultation, no signs of consolidation by percussion or palpation, normal fremitus, symmetrical and full respiratory excursions Cardiovascular: normal position and quality of the apical impulse, regular rhythm, normal  first and second heart sounds, no murmurs, rubs or gallops Abdomen: no tenderness or distention, no masses by palpation, no abnormal pulsatility or arterial bruits, normal bowel sounds, no hepatosplenomegaly Extremities: no clubbing, cyanosis or edema; 2+ radial, ulnar and brachial pulses bilaterally; 2+ right femoral, posterior tibial and dorsalis pedis pulses; 2+ left femoral, posterior tibial and dorsalis pedis pulses; no subclavian or femoral bruits Neurological: grossly nonfocal   EKG: Mild sinus bradycardia with first degree AV block  Lipid Panel     Component Value Date/Time   CHOL  Value: 104        ATP III CLASSIFICATION:  <200     mg/dL   Desirable  086-578  mg/dL   Borderline High  >=469    mg/dL   High 10/04/5282 1324   TRIG 102 06/19/2007 0223   HDL 19* 06/19/2007 0223   CHOLHDL 5.5 06/19/2007 0223   VLDL 20 06/19/2007 0223   LDLCALC  Value: 65        Total Cholesterol/HDL:CHD Risk Coronary Heart Disease Risk Table                     Men   Women  1/2 Average Risk   3.4   3.3 06/19/2007 0223    BMET    Component Value Date/Time   NA 136 09/10/2011 0042   K 3.6 09/10/2011 0042   CL 102 09/10/2011 0042   CO2 26 09/10/2011 0042   GLUCOSE 94 09/10/2011 0042   BUN 19 09/10/2011 0042   CREATININE 0.88 09/10/2011 0042   CALCIUM 8.9 09/10/2011 0042   GFRNONAA 81* 09/10/2011 0042   GFRAA >90 09/10/2011 0042     ASSESSMENT AND PLAN Atrial fibrillation From a symptomatic standpoint he has had "just a little atrial fibrillation". It really has not troubled him or prevented him from the remaining active. He takes warfarin and has therapeutic anticoagulation levels. Antiarrhythmic therapy does not appear to be justified for his relatively infrequent arrhythmic events. There is no history of stroke or TIA.  HTN (hypertension) For the most part his blood pressure is well controlled except he has elevated blood pressure first thing in the morning. I've recommended that he start taking the lisinopril in the  evenings.  Hyperlipidemia Lipid levels are satisfactory on the current regimen.  Orders Placed This Encounter  Procedures  . EKG 12-Lead   Meds ordered this encounter  Medications  . ketoconazole (NIZORAL) 2 % cream    Sig: Apply 1 application topically 2 (two) times daily.     Junious Silk, MD, [facility] [phone] office [phone] pager

## 2012-09-25 NOTE — Assessment & Plan Note (Signed)
From a symptomatic standpoint he has had "just a little atrial fibrillation". It really has not troubled him or prevented him from the remaining active. He takes warfarin and has therapeutic anticoagulation levels. Antiarrhythmic therapy does not appear to be justified for his relatively infrequent arrhythmic events. There is no history of stroke or TIA.

## 2012-09-25 NOTE — Assessment & Plan Note (Signed)
Lipid levels are satisfactory on the current regimen.

## 2012-09-25 NOTE — Assessment & Plan Note (Signed)
For the most part his blood pressure is well controlled except he has elevated blood pressure first thing in the morning. I've recommended that he start taking the lisinopril in the evenings.

## 2012-10-05 ENCOUNTER — Encounter: Payer: Self-pay | Admitting: Orthopedic Surgery

## 2012-10-05 ENCOUNTER — Ambulatory Visit (INDEPENDENT_AMBULATORY_CARE_PROVIDER_SITE_OTHER): Payer: Medicare Other | Admitting: Orthopedic Surgery

## 2012-10-05 VITALS — BP 140/82 | Ht 72.0 in | Wt 194.0 lb

## 2012-10-05 DIAGNOSIS — M7671 Peroneal tendinitis, right leg: Secondary | ICD-10-CM

## 2012-10-05 DIAGNOSIS — M19079 Primary osteoarthritis, unspecified ankle and foot: Secondary | ICD-10-CM | POA: Diagnosis not present

## 2012-10-05 DIAGNOSIS — M7670 Peroneal tendinitis, unspecified leg: Secondary | ICD-10-CM | POA: Insufficient documentation

## 2012-10-05 DIAGNOSIS — M7750 Other enthesopathy of unspecified foot: Secondary | ICD-10-CM | POA: Diagnosis not present

## 2012-10-05 NOTE — Progress Notes (Signed)
[name], male   DOB: [birth year], 79 y.o.   MRN: [id number] Chief Complaint  Patient presents with  . Foot Pain    Right foot pain d/t inflammation of tendon; injury 20 years ago     history this is a 79 year old male who presents with several year history of pain and swelling in his right foot which worsened several weeks ago but has since been treated and he has improved there is questionable history of gout inflammation. Symptoms came on this gradually over the years but is recently had sudden onset of pain and swelling. He is complaining of pain near the metatarsophalangeal joints and then on the medial and lateral aspects of the ankle he has a chronic flat foot. His pain is 9/10 sharp throbbing better if he stays off of it it's worse when he is working out or walking  He has a history of snoring and easy bleeding and bruising he is on Coumadin and has joint pain and swelling his other 11 systems reviewed were normal  He has a stent in his heart. He's had some surgery.  Past Surgical History  Procedure Laterality Date  . Hernia repair  2009    left inguinal  . Cataract extraction      bilateral  . Cardiac catheterization  06/11/2007    PTCA X2 in 06/2007:  2 overlapping Cypher stents to LAD (2.5x13 and 3.0x23)  . Colonoscopy  2007    Dr. Karilyn Cota- L sided diverticulosis. Next TCS 2012 due to h/o tubular adenoma.  . Esophagogastroduodenoscopy  07/2007    Dr. Elmer Ramp  . Colonoscopy  02/26/2011    Procedure: COLONOSCOPY;  Surgeon: Corbin Ade, MD;  [facility]: [facility];  Service: Endoscopy;  Laterality: N/A;  11:05  . Tee without cardioversion  12/12/2011    Procedure: TRANSESOPHAGEAL ECHOCARDIOGRAM (TEE);  Surgeon: Thurmon Fair, MD;  Location: [facility] ENDOSCOPY;  Service: Cardiovascular;  Laterality: N/A;   successful/sinus rhythm  . Cardioversion  12/12/2011    Procedure: CARDIOVERSION;  Surgeon: Thurmon Fair, MD;  Location: [facility] ENDOSCOPY;  Service:  Cardiovascular;  Laterality: N/A;  . Cardiac catheterization  06/21/2007    Cypher stent 2.5 x 13  to OM branch  . Stents      He is married he does not smoke, he does not drink  BP 140/82  Ht 6' (1.829 m)  Wt 194 lb (87.998 kg)  BMI 26.31 kg/m2 General appearance is normal, the patient is alert and oriented x3 with normal mood and affect. His body habitus is medium frame.  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 He has slight decreased range of motion in the ankle joint. He has a stable ankle joint. He has some venous stasis changes. Sensation to dorsal palpation is normal.  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 I would not give him any anti-inflammatories because of his Coumadin he should see a podiatrist regarding his second digit abnormality. No followup needed

## 2012-10-06 ENCOUNTER — Telehealth: Payer: Self-pay | Admitting: *Deleted

## 2012-10-06 ENCOUNTER — Other Ambulatory Visit: Payer: Self-pay | Admitting: *Deleted

## 2012-10-06 DIAGNOSIS — M7671 Peroneal tendinitis, right leg: Secondary | ICD-10-CM

## 2012-10-06 NOTE — Telephone Encounter (Signed)
Faxed referral and office notes to Dr. Nolen Mu. Awaiting appointment.

## 2012-10-20 ENCOUNTER — Ambulatory Visit (INDEPENDENT_AMBULATORY_CARE_PROVIDER_SITE_OTHER): Payer: Medicare Other | Admitting: Pharmacist Clinician (PhC)/ Clinical Pharmacy Specialist

## 2012-10-20 VITALS — BP 124/72 | HR 60

## 2012-10-20 DIAGNOSIS — Z7901 Long term (current) use of anticoagulants: Secondary | ICD-10-CM | POA: Diagnosis not present

## 2012-10-20 DIAGNOSIS — I4891 Unspecified atrial fibrillation: Secondary | ICD-10-CM | POA: Diagnosis not present

## 2012-10-20 LAB — POCT INR: INR: 2.8

## 2012-10-20 NOTE — Telephone Encounter (Signed)
Appointment with Dr. Nolen Mu 11/26/12

## 2012-11-02 DIAGNOSIS — I10 Essential (primary) hypertension: Secondary | ICD-10-CM | POA: Diagnosis not present

## 2012-11-02 DIAGNOSIS — I4891 Unspecified atrial fibrillation: Secondary | ICD-10-CM | POA: Diagnosis not present

## 2012-11-02 DIAGNOSIS — M1710 Unilateral primary osteoarthritis, unspecified knee: Secondary | ICD-10-CM | POA: Diagnosis not present

## 2012-11-02 DIAGNOSIS — B372 Candidiasis of skin and nail: Secondary | ICD-10-CM | POA: Diagnosis not present

## 2012-11-02 DIAGNOSIS — IMO0002 Reserved for concepts with insufficient information to code with codable children: Secondary | ICD-10-CM | POA: Diagnosis not present

## 2012-11-03 DIAGNOSIS — M1710 Unilateral primary osteoarthritis, unspecified knee: Secondary | ICD-10-CM | POA: Diagnosis not present

## 2012-11-16 DIAGNOSIS — I4891 Unspecified atrial fibrillation: Secondary | ICD-10-CM | POA: Diagnosis not present

## 2012-11-16 DIAGNOSIS — E785 Hyperlipidemia, unspecified: Secondary | ICD-10-CM | POA: Diagnosis not present

## 2012-11-16 DIAGNOSIS — E039 Hypothyroidism, unspecified: Secondary | ICD-10-CM | POA: Diagnosis not present

## 2012-11-16 DIAGNOSIS — I10 Essential (primary) hypertension: Secondary | ICD-10-CM | POA: Diagnosis not present

## 2012-11-16 DIAGNOSIS — L84 Corns and callosities: Secondary | ICD-10-CM | POA: Diagnosis not present

## 2012-11-17 ENCOUNTER — Ambulatory Visit (INDEPENDENT_AMBULATORY_CARE_PROVIDER_SITE_OTHER): Payer: Medicare Other | Admitting: Pharmacist Clinician (PhC)/ Clinical Pharmacy Specialist

## 2012-11-17 VITALS — BP 150/72 | HR 60

## 2012-11-17 DIAGNOSIS — R972 Elevated prostate specific antigen [PSA]: Secondary | ICD-10-CM | POA: Diagnosis not present

## 2012-11-17 DIAGNOSIS — I4891 Unspecified atrial fibrillation: Secondary | ICD-10-CM

## 2012-11-17 DIAGNOSIS — Z7901 Long term (current) use of anticoagulants: Secondary | ICD-10-CM | POA: Diagnosis not present

## 2012-11-17 DIAGNOSIS — N401 Enlarged prostate with lower urinary tract symptoms: Secondary | ICD-10-CM | POA: Diagnosis not present

## 2012-11-17 LAB — POCT INR: INR: 2.4

## 2012-11-24 ENCOUNTER — Ambulatory Visit (INDEPENDENT_AMBULATORY_CARE_PROVIDER_SITE_OTHER): Payer: Medicare Other | Admitting: Cardiovascular Disease

## 2012-11-24 ENCOUNTER — Encounter: Payer: Self-pay | Admitting: Cardiovascular Disease

## 2012-11-24 VITALS — BP 140/72 | HR 57 | Ht 72.0 in | Wt 190.5 lb

## 2012-11-24 DIAGNOSIS — Z23 Encounter for immunization: Secondary | ICD-10-CM | POA: Insufficient documentation

## 2012-11-24 DIAGNOSIS — Z01810 Encounter for preprocedural cardiovascular examination: Secondary | ICD-10-CM | POA: Insufficient documentation

## 2012-11-24 DIAGNOSIS — I4891 Unspecified atrial fibrillation: Secondary | ICD-10-CM

## 2012-11-24 DIAGNOSIS — I10 Essential (primary) hypertension: Secondary | ICD-10-CM | POA: Diagnosis not present

## 2012-11-24 NOTE — Progress Notes (Signed)
[name], male   DOB: 02-26-1932, 79 y.o.   MRN: [id number]     Reason for office visit Preoperative cardiovascular risk evaluation; paroxysmal atrial fibrillation  Jerry Cain is a remarkably fit and active 79 year old gentleman who is considering left total knee replacement secondary to advanced degenerative joint disease. Remarkably over the last few weeks he has experienced almost complete resolution of his knee pain which he considers to be miraculous. He's not sure that he will still want to go ahead with knee replacement although this was recommended based on the severity of his arthritis.  He has fairly infrequent and only mildly symptomatic episodes of paroxysmal atrial fibrillation. In the last several months he has not had a single spell. He is very active in his yard taking care of a large vegetable garden which keeps him busy for hours every day. He denies shortness of breath, chest pain, palpitations, syncope or dizziness, focal logical deficits or any bleeding complications. He is on warfarin anticoagulation. He has never expressed a stroke or TIA.  Comorbidity includes systemic hypertension and hyperlipidemia both of which are adequately treated. By echocardiography roughly one year ago he has preserved left ventricular systolic function and no valvular abnormalities. He had a normal stress nuclear myocardial scan in September of last year  Allergies  Allergen Reactions  . Penicillins Other (See Comments)    Does not know     Current Outpatient Prescriptions  Medication Sig Dispense Refill  . aspirin 81 MG tablet Take 81 mg by mouth daily.        Marland Kitchen atorvastatin (LIPITOR) 40 MG tablet Take 40 mg by mouth daily.      Marland Kitchen BYSTOLIC 5 MG tablet Take 5 mg by mouth daily. Takes 1/2 tablet      . Calcium Carbonate-Vitamin D (CALCIUM + D) 600-200 MG-UNIT TABS Take by mouth.        . finasteride (PROSCAR) 5 MG tablet Take 5 mg by mouth daily.       . isosorbide  mononitrate (IMDUR) 30 MG 24 hr tablet Take 30 mg by mouth daily.       Marland Kitchen ketoconazole (NIZORAL) 2 % cream Apply 1 application topically 2 (two) times daily.       Marland Kitchen LEVOXYL 88 MCG tablet Take 88 mcg by mouth daily.       Marland Kitchen lisinopril (PRINIVIL,ZESTRIL) 20 MG tablet Take 20 mg by mouth daily.        . Multiple Vitamin (MULTIVITAMIN) capsule Take 1 capsule by mouth daily.        Marland Kitchen NITROSTAT 0.4 MG SL tablet Place 0.4 mg under the tongue every 5 (five) minutes as needed.       . psyllium (METAMUCIL) 58.6 % powder Take 1 packet by mouth daily. (3 teaspoonsful once daily at bedtime)      . warfarin (COUMADIN) 5 MG tablet Take 2.5-5 mg by mouth daily. Takes one-half tablet on FRIDAYS ONLY       No current facility-administered medications for this visit.    Past Medical History  Diagnosis Date  . Hypertension   . Hypercholesterolemia   . Hypothyroidism   . Gout   . BPH (benign prostatic hyperplasia)   . CAD (coronary artery disease) 2009    cardiac cath and PTCA by Dr. Jenne Campus  . Tubular adenoma 2001  . Diverticulosis 2007    tcs by Dr. Karilyn Cota  . GERD (gastroesophageal reflux disease)   . Adenomatous colon polyp 2001  .  Paroxysmal a-fib 2012    now on coumadin    Past Surgical History  Procedure Laterality Date  . Hernia repair  2009    left inguinal  . Cataract extraction      bilateral  . Cardiac catheterization  06/11/2007    PTCA X2 in 06/2007:  2 overlapping Cypher stents to LAD (2.5x13 and 3.0x23)  . Colonoscopy  2007    Dr. Karilyn Cota- L sided diverticulosis. Next TCS 2012 due to h/o tubular adenoma.  . Esophagogastroduodenoscopy  07/2007    Dr. Elmer Ramp  . Colonoscopy  02/26/2011    Procedure: COLONOSCOPY;  Surgeon: Corbin Ade, MD;  Location: [facility];  Service: Endoscopy;  Laterality: N/A;  11:05  . Tee without cardioversion  12/12/2011    Procedure: TRANSESOPHAGEAL ECHOCARDIOGRAM (TEE);  Surgeon: Thurmon Fair, MD;  Location: [facility] ENDOSCOPY;  Service:  Cardiovascular;  Laterality: N/A;   successful/sinus rhythm  . Cardioversion  12/12/2011    Procedure: CARDIOVERSION;  Surgeon: Thurmon Fair, MD;  Location: [facility] ENDOSCOPY;  Service: Cardiovascular;  Laterality: N/A;  . Cardiac catheterization  06/21/2007    Cypher stent 2.5 x 13  to OM branch  . Stents      Family History  Problem Relation Age of Onset  . Colon cancer Neg Hx   . Pneumonia Father   . Heart disease Brother     open heart surgery at age 4    History   Social History  . Marital Status: Married    Spouse Name: N/A    Number of Children: 4  . Years of Education: N/A   Occupational History  . retired    Social History Main Topics  . Smoking status: Never Smoker   . Smokeless tobacco: Not on file  . Alcohol Use: No  . Drug Use: No  . Sexual Activity: Not on file   Other Topics Concern  . Not on file   Social History Narrative  . No narrative on file    Review of systems: The patient specifically denies any chest pain at rest or with exertion, dyspnea at rest or with exertion, orthopnea, paroxysmal nocturnal dyspnea, syncope, palpitations, focal neurological deficits, intermittent claudication, lower extremity edema, unexplained weight gain, cough, hemoptysis or wheezing.  The patient also denies abdominal pain, nausea, vomiting, dysphagia, diarrhea, constipation, polyuria, polydipsia, dysuria, hematuria, frequency, urgency, abnormal bleeding or bruising, fever, chills, unexpected weight changes, mood swings, change in skin or hair texture, change in voice quality, auditory or visual problems, allergic reactions or rashes, new musculoskeletal complaints other than usual "aches and pains".   PHYSICAL EXAM BP 140/72  Pulse 57  Ht 6' (1.829 m)  Wt 190 lb 8 oz (86.41 kg)  BMI 25.83 kg/m2  General: Alert, oriented x3, no distress Head: no evidence of trauma, PERRL, EOMI, no exophtalmos or lid lag, no myxedema, no xanthelasma; normal ears, nose and  oropharynx Neck: normal jugular venous pulsations and no hepatojugular reflux; brisk carotid pulses without delay and no carotid bruits Chest: clear to auscultation, no signs of consolidation by percussion or palpation, normal fremitus, symmetrical and full respiratory excursions Cardiovascular: normal position and quality of the apical impulse, regular rhythm, normal first and second heart sounds, no murmurs, rubs or gallops Abdomen: no tenderness or distention, no masses by palpation, no abnormal pulsatility or arterial bruits, normal bowel sounds, no hepatosplenomegaly Extremities: no clubbing, cyanosis or edema; 2+ radial, ulnar and brachial pulses bilaterally; 2+ right femoral, posterior tibial and dorsalis pedis pulses; 2+ left  femoral, posterior tibial and dorsalis pedis pulses; no subclavian or femoral bruits Neurological: grossly nonfocal   EKG: Sinus bradycardia with first degree atrial ventricular block  Lipid Panel  December 2013 total cholesterol 81, LDL 35, triglycerides 62, HDL 34, small LDL particles 673, LDL size 19.9.  BMET    Component Value Date/Time   NA 136 09/10/2011 0042   K 3.6 09/10/2011 0042   CL 102 09/10/2011 0042   CO2 26 09/10/2011 0042   GLUCOSE 94 09/10/2011 0042   BUN 19 09/10/2011 0042   CREATININE 0.88 09/10/2011 0042   CALCIUM 8.9 09/10/2011 0042   GFRNONAA 81* 09/10/2011 0042   GFRAA >90 09/10/2011 0042     ASSESSMENT AND PLAN Atrial fibrillation Rare episodes of mildly symptomatic paroxysmal atrial fibrillation. Sinus bradycardia prevents more beta blocker or full antiarrhythmic use. Can stop warfarin if needed for 5 days before planned surgery. No indication for "bridging" therapy.  HTN (hypertension) Good control.  Preop cardiovascular exam He is at low risk for major cardiovascular complications of the planned orthopedic surgery. If he decides to go ahead with it I do not think he requires additional evaluation at this time. His warfarin can be stopped  without "bridging" for 4-5 days after his procedure. Warfarin should be resumed as soon as possible postoperatively both for protection from cardioembolic events and protection from surgical related deep venous thrombosis/pulmonary embolism.   Orders Placed This Encounter  Procedures  . EKG 12-Lead    Tashira Torre  Thurmon Fair, MD, [facility] [phone] office [phone] pager

## 2012-11-24 NOTE — Assessment & Plan Note (Signed)
Rare episodes of mildly symptomatic paroxysmal atrial fibrillation. Sinus bradycardia prevents more beta blocker or full antiarrhythmic use. Can stop warfarin if needed for 5 days before planned surgery. No indication for "bridging" therapy.

## 2012-11-24 NOTE — Assessment & Plan Note (Signed)
He is at low risk for major cardiovascular complications of the planned orthopedic surgery. If he decides to go ahead with it I do not think he requires additional evaluation at this time. His warfarin can be stopped without "bridging" for 4-5 days after his procedure. Warfarin should be resumed as soon as possible postoperatively both for protection from cardioembolic events and protection from surgical related deep venous thrombosis/pulmonary embolism.

## 2012-11-24 NOTE — Assessment & Plan Note (Signed)
Good control

## 2012-11-24 NOTE — Patient Instructions (Addendum)
Your physician recommends that you schedule a follow-up appointment as scheduled in June 2015

## 2012-11-26 DIAGNOSIS — M2010 Hallux valgus (acquired), unspecified foot: Secondary | ICD-10-CM | POA: Diagnosis not present

## 2012-11-26 DIAGNOSIS — M2040 Other hammer toe(s) (acquired), unspecified foot: Secondary | ICD-10-CM | POA: Diagnosis not present

## 2012-11-26 DIAGNOSIS — L97409 Non-pressure chronic ulcer of unspecified heel and midfoot with unspecified severity: Secondary | ICD-10-CM | POA: Diagnosis not present

## 2012-12-03 DIAGNOSIS — M2040 Other hammer toe(s) (acquired), unspecified foot: Secondary | ICD-10-CM | POA: Diagnosis not present

## 2012-12-03 DIAGNOSIS — M2010 Hallux valgus (acquired), unspecified foot: Secondary | ICD-10-CM | POA: Diagnosis not present

## 2012-12-03 DIAGNOSIS — L97409 Non-pressure chronic ulcer of unspecified heel and midfoot with unspecified severity: Secondary | ICD-10-CM | POA: Diagnosis not present

## 2012-12-15 ENCOUNTER — Ambulatory Visit (INDEPENDENT_AMBULATORY_CARE_PROVIDER_SITE_OTHER): Payer: Medicare Other | Admitting: Pharmacist Clinician (PhC)/ Clinical Pharmacy Specialist

## 2012-12-15 VITALS — BP 138/60 | HR 64

## 2012-12-15 DIAGNOSIS — Z7901 Long term (current) use of anticoagulants: Secondary | ICD-10-CM

## 2012-12-15 DIAGNOSIS — I4891 Unspecified atrial fibrillation: Secondary | ICD-10-CM | POA: Diagnosis not present

## 2012-12-15 LAB — POCT INR: INR: 2.8

## 2012-12-17 DIAGNOSIS — M2040 Other hammer toe(s) (acquired), unspecified foot: Secondary | ICD-10-CM | POA: Diagnosis not present

## 2012-12-17 DIAGNOSIS — M2010 Hallux valgus (acquired), unspecified foot: Secondary | ICD-10-CM | POA: Diagnosis not present

## 2012-12-17 DIAGNOSIS — L97509 Non-pressure chronic ulcer of other part of unspecified foot with unspecified severity: Secondary | ICD-10-CM | POA: Diagnosis not present

## 2012-12-21 DIAGNOSIS — M999 Biomechanical lesion, unspecified: Secondary | ICD-10-CM | POA: Diagnosis not present

## 2012-12-21 DIAGNOSIS — M5137 Other intervertebral disc degeneration, lumbosacral region: Secondary | ICD-10-CM | POA: Diagnosis not present

## 2012-12-22 DIAGNOSIS — M999 Biomechanical lesion, unspecified: Secondary | ICD-10-CM | POA: Diagnosis not present

## 2012-12-22 DIAGNOSIS — M5137 Other intervertebral disc degeneration, lumbosacral region: Secondary | ICD-10-CM | POA: Diagnosis not present

## 2012-12-23 DIAGNOSIS — M999 Biomechanical lesion, unspecified: Secondary | ICD-10-CM | POA: Diagnosis not present

## 2012-12-23 DIAGNOSIS — M5137 Other intervertebral disc degeneration, lumbosacral region: Secondary | ICD-10-CM | POA: Diagnosis not present

## 2012-12-27 ENCOUNTER — Other Ambulatory Visit: Payer: Self-pay | Admitting: *Deleted

## 2012-12-27 DIAGNOSIS — M999 Biomechanical lesion, unspecified: Secondary | ICD-10-CM | POA: Diagnosis not present

## 2012-12-27 DIAGNOSIS — M5137 Other intervertebral disc degeneration, lumbosacral region: Secondary | ICD-10-CM | POA: Diagnosis not present

## 2012-12-27 MED ORDER — NITROGLYCERIN 0.4 MG SL SUBL: 0.4000 mg | SUBLINGUAL_TABLET | SUBLINGUAL | Status: DC | PRN

## 2012-12-30 DIAGNOSIS — M5137 Other intervertebral disc degeneration, lumbosacral region: Secondary | ICD-10-CM | POA: Diagnosis not present

## 2012-12-30 DIAGNOSIS — M999 Biomechanical lesion, unspecified: Secondary | ICD-10-CM | POA: Diagnosis not present

## 2013-01-03 DIAGNOSIS — M999 Biomechanical lesion, unspecified: Secondary | ICD-10-CM | POA: Diagnosis not present

## 2013-01-03 DIAGNOSIS — M5137 Other intervertebral disc degeneration, lumbosacral region: Secondary | ICD-10-CM | POA: Diagnosis not present

## 2013-01-04 DIAGNOSIS — L97509 Non-pressure chronic ulcer of other part of unspecified foot with unspecified severity: Secondary | ICD-10-CM | POA: Diagnosis not present

## 2013-01-12 ENCOUNTER — Ambulatory Visit (INDEPENDENT_AMBULATORY_CARE_PROVIDER_SITE_OTHER): Payer: Medicare Other | Admitting: Pharmacist Clinician (PhC)/ Clinical Pharmacy Specialist

## 2013-01-12 VITALS — BP 148/70 | HR 60

## 2013-01-12 DIAGNOSIS — I4891 Unspecified atrial fibrillation: Secondary | ICD-10-CM

## 2013-01-12 DIAGNOSIS — Z7901 Long term (current) use of anticoagulants: Secondary | ICD-10-CM | POA: Diagnosis not present

## 2013-01-13 DIAGNOSIS — M5137 Other intervertebral disc degeneration, lumbosacral region: Secondary | ICD-10-CM | POA: Diagnosis not present

## 2013-01-13 DIAGNOSIS — M4000 Postural kyphosis, site unspecified: Secondary | ICD-10-CM | POA: Diagnosis not present

## 2013-01-17 ENCOUNTER — Other Ambulatory Visit (HOSPITAL_COMMUNITY): Payer: Self-pay | Admitting: *Deleted

## 2013-01-17 DIAGNOSIS — IMO0002 Reserved for concepts with insufficient information to code with codable children: Secondary | ICD-10-CM

## 2013-01-17 DIAGNOSIS — M549 Dorsalgia, unspecified: Secondary | ICD-10-CM

## 2013-01-19 ENCOUNTER — Ambulatory Visit (HOSPITAL_COMMUNITY)
Admission: RE | Admit: 2013-01-19 | Discharge: 2013-01-19 | Disposition: A | Discharge status: Home / Self Care | Payer: Medicare Other | Source: Ambulatory Visit | Attending: *Deleted | Admitting: *Deleted

## 2013-01-19 DIAGNOSIS — S32009A Unspecified fracture of unspecified lumbar vertebra, initial encounter for closed fracture: Secondary | ICD-10-CM | POA: Diagnosis not present

## 2013-01-19 DIAGNOSIS — IMO0002 Reserved for concepts with insufficient information to code with codable children: Secondary | ICD-10-CM

## 2013-01-19 DIAGNOSIS — M549 Dorsalgia, unspecified: Secondary | ICD-10-CM

## 2013-01-21 DIAGNOSIS — L97509 Non-pressure chronic ulcer of other part of unspecified foot with unspecified severity: Secondary | ICD-10-CM | POA: Diagnosis not present

## 2013-01-24 ENCOUNTER — Encounter: Payer: Self-pay | Admitting: Cardiovascular Disease

## 2013-01-31 ENCOUNTER — Other Ambulatory Visit (HOSPITAL_COMMUNITY): Payer: Self-pay | Admitting: Interventional Radiology

## 2013-01-31 ENCOUNTER — Ambulatory Visit (HOSPITAL_COMMUNITY)
Admission: RE | Admit: 2013-01-31 | Discharge: 2013-01-31 | Disposition: A | Discharge status: Home / Self Care | Payer: Medicare Other | Source: Ambulatory Visit | Attending: Interventional Radiology | Admitting: Interventional Radiology

## 2013-01-31 DIAGNOSIS — M545 Low back pain, unspecified: Secondary | ICD-10-CM | POA: Diagnosis not present

## 2013-01-31 DIAGNOSIS — M549 Dorsalgia, unspecified: Secondary | ICD-10-CM

## 2013-01-31 DIAGNOSIS — M48061 Spinal stenosis, lumbar region without neurogenic claudication: Secondary | ICD-10-CM | POA: Insufficient documentation

## 2013-01-31 DIAGNOSIS — M51379 Other intervertebral disc degeneration, lumbosacral region without mention of lumbar back pain or lower extremity pain: Secondary | ICD-10-CM | POA: Diagnosis not present

## 2013-01-31 DIAGNOSIS — IMO0002 Reserved for concepts with insufficient information to code with codable children: Secondary | ICD-10-CM

## 2013-01-31 DIAGNOSIS — M5380 Other specified dorsopathies, site unspecified: Secondary | ICD-10-CM | POA: Diagnosis not present

## 2013-01-31 DIAGNOSIS — M47817 Spondylosis without myelopathy or radiculopathy, lumbosacral region: Secondary | ICD-10-CM | POA: Diagnosis not present

## 2013-01-31 DIAGNOSIS — M5137 Other intervertebral disc degeneration, lumbosacral region: Secondary | ICD-10-CM | POA: Diagnosis not present

## 2013-02-02 ENCOUNTER — Telehealth (HOSPITAL_COMMUNITY): Payer: Self-pay | Admitting: Interventional Radiology

## 2013-02-02 NOTE — Telephone Encounter (Signed)
Called pt left VM JM

## 2013-02-04 ENCOUNTER — Telehealth: Payer: Self-pay | Admitting: *Deleted

## 2013-02-04 MED ORDER — CARVEDILOL 3.125 MG PO TABS: 3.1250 mg | ORAL_TABLET | Freq: Two times a day (BID) | ORAL | Status: DC

## 2013-02-04 NOTE — Telephone Encounter (Signed)
Received notice from insurance that Bystolic will not be covered in 2015.  Dr. Salena Saner gave order to switch to Carvedilol 3.125mg  bid.  Left message for patient.  Will send order over to the pharmacy to switch meds in January 2015.

## 2013-02-09 ENCOUNTER — Ambulatory Visit (INDEPENDENT_AMBULATORY_CARE_PROVIDER_SITE_OTHER): Payer: Medicare Other | Admitting: Pharmacist Clinician (PhC)/ Clinical Pharmacy Specialist

## 2013-02-09 VITALS — BP 158/72 | HR 64

## 2013-02-09 DIAGNOSIS — I4891 Unspecified atrial fibrillation: Secondary | ICD-10-CM | POA: Diagnosis not present

## 2013-02-09 DIAGNOSIS — Z7901 Long term (current) use of anticoagulants: Secondary | ICD-10-CM | POA: Diagnosis not present

## 2013-02-09 LAB — POCT INR: INR: 2

## 2013-02-11 DIAGNOSIS — L97509 Non-pressure chronic ulcer of other part of unspecified foot with unspecified severity: Secondary | ICD-10-CM | POA: Diagnosis not present

## 2013-02-25 DIAGNOSIS — L97509 Non-pressure chronic ulcer of other part of unspecified foot with unspecified severity: Secondary | ICD-10-CM | POA: Diagnosis not present

## 2013-02-25 DIAGNOSIS — M2040 Other hammer toe(s) (acquired), unspecified foot: Secondary | ICD-10-CM | POA: Diagnosis not present

## 2013-02-25 DIAGNOSIS — M2030 Hallux varus (acquired), unspecified foot: Secondary | ICD-10-CM | POA: Diagnosis not present

## 2013-02-28 ENCOUNTER — Other Ambulatory Visit: Payer: Self-pay | Admitting: *Deleted

## 2013-02-28 ENCOUNTER — Telehealth: Payer: Self-pay | Admitting: Cardiovascular Disease

## 2013-02-28 MED ORDER — LISINOPRIL 20 MG PO TABS: 20.0000 mg | ORAL_TABLET | Freq: Every day | ORAL | Status: DC

## 2013-02-28 NOTE — Telephone Encounter (Signed)
Wants Belenda Cruise to call him regarding coming off coumadin for surgery

## 2013-03-01 DIAGNOSIS — R350 Frequency of micturition: Secondary | ICD-10-CM | POA: Diagnosis not present

## 2013-03-01 NOTE — Telephone Encounter (Signed)
LMOM, will call back wednesday

## 2013-03-02 ENCOUNTER — Other Ambulatory Visit: Payer: Self-pay

## 2013-03-02 MED ORDER — ATORVASTATIN CALCIUM 40 MG PO TABS: 40.0000 mg | ORAL_TABLET | Freq: Every day | ORAL | Status: DC

## 2013-03-02 NOTE — Telephone Encounter (Signed)
Rx was sent to pharmacy electronically. 

## 2013-03-02 NOTE — Telephone Encounter (Signed)
LMOM ok to hold warfarin x 5 days for surgical procedure.

## 2013-03-07 ENCOUNTER — Other Ambulatory Visit: Payer: Self-pay | Admitting: Podiatry

## 2013-03-08 NOTE — Addendum Note (Signed)
Addended by: Kimothy Kishimoto on: 03/08/2013 05:36 PM   Modules accepted: Orders  

## 2013-03-09 ENCOUNTER — Ambulatory Visit (INDEPENDENT_AMBULATORY_CARE_PROVIDER_SITE_OTHER): Payer: Medicare Other | Admitting: Pharmacist Clinician (PhC)/ Clinical Pharmacy Specialist

## 2013-03-09 VITALS — BP 132/66 | HR 64

## 2013-03-09 DIAGNOSIS — Z7901 Long term (current) use of anticoagulants: Secondary | ICD-10-CM

## 2013-03-09 DIAGNOSIS — I4891 Unspecified atrial fibrillation: Secondary | ICD-10-CM | POA: Diagnosis not present

## 2013-03-09 LAB — POCT INR: INR: 2.2

## 2013-03-11 DIAGNOSIS — M2030 Hallux varus (acquired), unspecified foot: Secondary | ICD-10-CM | POA: Diagnosis not present

## 2013-03-11 DIAGNOSIS — M2040 Other hammer toe(s) (acquired), unspecified foot: Secondary | ICD-10-CM | POA: Diagnosis not present

## 2013-03-14 ENCOUNTER — Encounter (HOSPITAL_COMMUNITY): Payer: Self-pay

## 2013-03-14 ENCOUNTER — Ambulatory Visit (HOSPITAL_COMMUNITY)
Admission: RE | Admit: 2013-03-14 | Discharge: 2013-03-14 | Disposition: A | Discharge status: Home / Self Care | Payer: Medicare Other | Source: Ambulatory Visit | Attending: Podiatry | Admitting: Podiatry

## 2013-03-14 ENCOUNTER — Encounter (HOSPITAL_COMMUNITY)
Admission: RE | Admit: 2013-03-14 | Discharge: 2013-03-14 | Disposition: A | Discharge status: Home / Self Care | Payer: Medicare Other | Source: Ambulatory Visit | Attending: Podiatry | Admitting: Podiatry

## 2013-03-14 DIAGNOSIS — M2010 Hallux valgus (acquired), unspecified foot: Secondary | ICD-10-CM | POA: Diagnosis not present

## 2013-03-14 DIAGNOSIS — M2140 Flat foot [pes planus] (acquired), unspecified foot: Secondary | ICD-10-CM | POA: Insufficient documentation

## 2013-03-14 DIAGNOSIS — M2040 Other hammer toe(s) (acquired), unspecified foot: Secondary | ICD-10-CM | POA: Insufficient documentation

## 2013-03-14 DIAGNOSIS — M7730 Calcaneal spur, unspecified foot: Secondary | ICD-10-CM | POA: Insufficient documentation

## 2013-03-14 DIAGNOSIS — M19079 Primary osteoarthritis, unspecified ankle and foot: Secondary | ICD-10-CM | POA: Diagnosis not present

## 2013-03-14 LAB — BASIC METABOLIC PANEL
CO2: 27 mEq/L (ref 19–32)
Chloride: 105 mEq/L (ref 96–112)
Creatinine, Ser: 0.95 mg/dL (ref 0.50–1.35)
Glucose, Bld: 87 mg/dL (ref 70–99)

## 2013-03-14 LAB — HEMOGLOBIN AND HEMATOCRIT, BLOOD: Hemoglobin: 12.6 g/dL — ABNORMAL LOW (ref 13.0–17.0)

## 2013-03-14 NOTE — Patient Instructions (Signed)
Jerry Cain  03/14/2013   Your procedure is scheduled on:  Thursday, 03/17/13  Report to [facility] at 0730 AM.  Call this number if you have problems the morning of surgery: [phone]   Remember:   Do not eat food or drink liquids after midnight.   Take these medicines the morning of surgery with A SIP OF WATER: BYSTOLIC, COREG, IMDUR, LEVOXYL, LISINOPRIL   Do not wear jewelry, make-up or nail polish.  Do not wear lotions, powders, or perfumes. You may wear deodorant.  Do not shave 48 hours prior to surgery. Men may shave face and neck.  Do not bring valuables to the hospital.  [facility] is not responsible                  for any belongings or valuables.               Contacts, dentures or bridgework may not be worn into surgery.  Leave suitcase in the car. After surgery it may be brought to your room.  For patients admitted to the hospital, discharge time is determined by your                treatment team.               Patients discharged the day of surgery will not be allowed to drive  home.  Name and phone number of your driver: FAMILY  Special Instructions: Shower using CHG 2 nights before surgery and the night before surgery.  If you shower the day of surgery use CHG.  Use special wash - you have one bottle of CHG for all showers.  You should use approximately 1/3 of the bottle for each shower.   Please read over the following fact sheets that you were given: Pain Booklet, Surgical Site Infection Prevention, Anesthesia Post-op Instructions and Care and Recovery After Surgery  Bunionectomy Care After Refer to this sheet for the next few weeks. These discharge instructions provide you with general information on caring for yourself after you leave the hospital. Your caregiver may also give you specific instructions. Your treatment has been planned according to the most current medical practices available, but unavoidable complications sometimes occur. If you have any  problems or questions after discharge, please call your caregiver. HOME CARE INSTRUCTIONS   Resume normal diet and activities as directed or allowed.  Put ice on the affected area.  Put ice in a plastic bag.  Place a towel between your skin and the bag.  Leave the ice on for 15-20 minutes each hour while awake for the first couple days following surgery.  Change bandages (dressings) if necessary or as directed.  Only take over-the-counter or prescription medicines for pain, discomfort, or fever as directed by your caregiver.  Make an appointment to see your caregiver for stitches (sutures) or staple removal as directed.  Put weight on the foot as your caregiver tells you. Most people are on crutches for the first week.  Do not wear high heels.  Wear your boot for 6 weeks or as directed by your caregiver. If you are told to use a splint or special shoe, do so until instructed otherwise.  Keep your foot raised (elevated). SEEK MEDICAL CARE IF:   You have increased bleeding from the wound.  You have redness, swelling, or increasing pain in the wound.  You have pus coming from the wound.  You have a fever.  You notice a bad smell  coming from the wound or dressing. SEEK IMMEDIATE MEDICAL CARE IF:   You have a rash.  You have difficulty breathing.  You have any reaction or side effects to medicines given. MAKE SURE YOU:   Understand these instructions.  Will watch your condition.  Will get help right away if you are not doing well or get worse. Document Released: 10/11/2004 Document Revised: 09/23/2011 Document Reviewed: 04/12/2007 [facility] Patient Information 2014 [place], [place].  PATIENT INSTRUCTIONS POST-ANESTHESIA  IMMEDIATELY FOLLOWING SURGERY:  Do not drive or operate machinery for the first twenty four hours after surgery.  Do not make any important decisions for twenty four hours after surgery or while taking narcotic pain medications or sedatives.  If you  develop intractable nausea and vomiting or a severe headache please notify your doctor immediately.  FOLLOW-UP:  Please make an appointment with your surgeon as instructed. You do not need to follow up with anesthesia unless specifically instructed to do so.  WOUND CARE INSTRUCTIONS (if applicable):  Keep a dry clean dressing on the anesthesia/puncture wound site if there is drainage.  Once the wound has quit draining you may leave it open to air.  Generally you should leave the bandage intact for twenty four hours unless there is drainage.  If the epidural site drains for more than 36-48 hours please call the anesthesia department.  QUESTIONS?:  Please feel free to call your physician or the hospital operator if you have any questions, and they will be happy to assist you.

## 2013-03-16 ENCOUNTER — Other Ambulatory Visit: Payer: Self-pay | Admitting: *Deleted

## 2013-03-16 MED ORDER — ISOSORBIDE MONONITRATE ER 30 MG PO TB24: 30.0000 mg | ORAL_TABLET | Freq: Every day | ORAL | Status: DC

## 2013-03-17 ENCOUNTER — Encounter (HOSPITAL_COMMUNITY): Payer: Self-pay | Admitting: *Deleted

## 2013-03-17 ENCOUNTER — Ambulatory Visit (HOSPITAL_COMMUNITY): Payer: Medicare Other

## 2013-03-17 ENCOUNTER — Ambulatory Visit (HOSPITAL_COMMUNITY)
Admission: RE | Admit: 2013-03-17 | Discharge: 2013-03-17 | Disposition: A | Discharge status: Home / Self Care | Payer: Medicare Other | Source: Ambulatory Visit | Attending: Podiatry | Admitting: Podiatry

## 2013-03-17 ENCOUNTER — Ambulatory Visit (HOSPITAL_COMMUNITY): Payer: Medicare Other | Admitting: Anesthesiology

## 2013-03-17 ENCOUNTER — Encounter (HOSPITAL_COMMUNITY): Payer: Medicare Other | Admitting: Anesthesiology

## 2013-03-17 ENCOUNTER — Encounter (HOSPITAL_COMMUNITY)
Admission: RE | Disposition: A | Discharge status: Home / Self Care | Payer: Self-pay | Source: Ambulatory Visit | Attending: Podiatry

## 2013-03-17 DIAGNOSIS — M2030 Hallux varus (acquired), unspecified foot: Secondary | ICD-10-CM | POA: Diagnosis not present

## 2013-03-17 DIAGNOSIS — M21619 Bunion of unspecified foot: Secondary | ICD-10-CM | POA: Diagnosis not present

## 2013-03-17 DIAGNOSIS — M2010 Hallux valgus (acquired), unspecified foot: Secondary | ICD-10-CM | POA: Diagnosis not present

## 2013-03-17 DIAGNOSIS — M24573 Contracture, unspecified ankle: Secondary | ICD-10-CM | POA: Insufficient documentation

## 2013-03-17 DIAGNOSIS — M2042 Other hammer toe(s) (acquired), left foot: Secondary | ICD-10-CM

## 2013-03-17 DIAGNOSIS — L97509 Non-pressure chronic ulcer of other part of unspecified foot with unspecified severity: Secondary | ICD-10-CM | POA: Insufficient documentation

## 2013-03-17 DIAGNOSIS — M2040 Other hammer toe(s) (acquired), unspecified foot: Secondary | ICD-10-CM | POA: Insufficient documentation

## 2013-03-17 DIAGNOSIS — I10 Essential (primary) hypertension: Secondary | ICD-10-CM | POA: Diagnosis not present

## 2013-03-17 DIAGNOSIS — M259 Joint disorder, unspecified: Secondary | ICD-10-CM | POA: Diagnosis not present

## 2013-03-17 DIAGNOSIS — M2041 Other hammer toe(s) (acquired), right foot: Secondary | ICD-10-CM

## 2013-03-17 HISTORY — PX: BUNIONECTOMY: SHX129

## 2013-03-17 HISTORY — PX: FLEXOR TENOTOMY: SHX6342

## 2013-03-17 HISTORY — PX: AIKEN OSTEOTOMY: SHX6331

## 2013-03-17 SURGERY — BUNIONECTOMY
Anesthesia: Monitor Anesthesia Care | Site: Foot | Laterality: Right

## 2013-03-17 MED ORDER — LACTATED RINGERS IV SOLN
INTRAVENOUS | Status: DC
  Administered 2013-03-17: 1000 mL via INTRAVENOUS

## 2013-03-17 MED ORDER — PROPOFOL 10 MG/ML IV EMUL
INTRAVENOUS | Status: AC
  Filled 2013-03-17: qty 20

## 2013-03-17 MED ORDER — FENTANYL CITRATE 0.05 MG/ML IJ SOLN: 25.0000 ug | INTRAMUSCULAR | Status: DC | PRN

## 2013-03-17 MED ORDER — MIDAZOLAM HCL 2 MG/2ML IJ SOLN
INTRAMUSCULAR | Status: AC
  Filled 2013-03-17: qty 2

## 2013-03-17 MED ORDER — PROPOFOL INFUSION 10 MG/ML OPTIME
INTRAVENOUS | Status: DC | PRN
  Administered 2013-03-17: 11:00:00 via INTRAVENOUS
  Administered 2013-03-17: 40 ug/kg/min via INTRAVENOUS

## 2013-03-17 MED ORDER — MIDAZOLAM HCL 2 MG/2ML IJ SOLN
1.0000 mg | INTRAMUSCULAR | Status: DC | PRN
  Administered 2013-03-17: 2 mg via INTRAVENOUS
  Filled 2013-03-17: qty 2

## 2013-03-17 MED ORDER — BUPIVACAINE HCL (PF) 0.5 % IJ SOLN
INTRAMUSCULAR | Status: DC | PRN
  Administered 2013-03-17: 30 mL

## 2013-03-17 MED ORDER — ONDANSETRON HCL 4 MG/2ML IJ SOLN: 4.0000 mg | Freq: Once | INTRAMUSCULAR | Status: DC | PRN

## 2013-03-17 MED ORDER — SODIUM CHLORIDE 0.9 % IR SOLN
Status: DC | PRN
  Administered 2013-03-17: 1000 mL

## 2013-03-17 MED ORDER — MIDAZOLAM HCL 5 MG/5ML IJ SOLN
INTRAMUSCULAR | Status: DC | PRN
  Administered 2013-03-17 (×2): 1 mg via INTRAVENOUS

## 2013-03-17 MED ORDER — FENTANYL CITRATE 0.05 MG/ML IJ SOLN
INTRAMUSCULAR | Status: AC
  Filled 2013-03-17: qty 2

## 2013-03-17 MED ORDER — FENTANYL CITRATE 0.05 MG/ML IJ SOLN
25.0000 ug | INTRAMUSCULAR | Status: AC
  Administered 2013-03-17 (×2): 25 ug via INTRAVENOUS
  Filled 2013-03-17: qty 2

## 2013-03-17 MED ORDER — BUPIVACAINE HCL (PF) 0.5 % IJ SOLN
INTRAMUSCULAR | Status: AC
  Filled 2013-03-17: qty 30

## 2013-03-17 MED ORDER — FENTANYL CITRATE 0.05 MG/ML IJ SOLN
INTRAMUSCULAR | Status: DC | PRN
  Administered 2013-03-17 (×4): 25 ug via INTRAVENOUS

## 2013-03-17 MED ORDER — LIDOCAINE HCL 1 % IJ SOLN
INTRAMUSCULAR | Status: DC | PRN
  Administered 2013-03-17: 25 mg via INTRADERMAL

## 2013-03-17 MED ORDER — CLINDAMYCIN PHOSPHATE 600 MG/50ML IV SOLN
600.0000 mg | Freq: Once | INTRAVENOUS | Status: AC
  Administered 2013-03-17: 600 mg via INTRAVENOUS
  Filled 2013-03-17: qty 50

## 2013-03-17 SURGICAL SUPPLY — 60 items
APL SKNCLS STERI-STRIP NONHPOA (GAUZE/BANDAGES/DRESSINGS) ×3
BAG HAMPER (MISCELLANEOUS) ×4 IMPLANT
BANDAGE CONFORM 2  STR LF (GAUZE/BANDAGES/DRESSINGS) ×2 IMPLANT
BANDAGE ELASTIC 4 VELCRO NS (GAUZE/BANDAGES/DRESSINGS) ×6 IMPLANT
BANDAGE ESMARK 4X12 BL STRL LF (DISPOSABLE) ×3 IMPLANT
BANDAGE GAUZE ELAST BULKY 4 IN (GAUZE/BANDAGES/DRESSINGS) ×6 IMPLANT
BENZOIN TINCTURE PRP APPL 2/3 (GAUZE/BANDAGES/DRESSINGS) ×4 IMPLANT
BLADE AVERAGE 25X9 (BLADE) ×2 IMPLANT
BLADE OSC/SAG 18.5X9 THN (BLADE) ×4 IMPLANT
BLADE OSC/SAGITTAL MD 5.5X18 (BLADE) ×4 IMPLANT
BLADE SURG 15 STRL LF DISP TIS (BLADE) ×6 IMPLANT
BLADE SURG 15 STRL SS (BLADE) ×8
BNDG CMPR 12X4 ELC STRL LF (DISPOSABLE) ×3
BNDG ESMARK 4X12 BLUE STRL LF (DISPOSABLE) ×4
CHLORAPREP W/TINT 26ML (MISCELLANEOUS) ×4 IMPLANT
CLOTH BEACON ORANGE TIMEOUT ST (SAFETY) ×4 IMPLANT
COVER LIGHT HANDLE STERIS (MISCELLANEOUS) ×8 IMPLANT
CUFF TOURN SGL LL 12 (TOURNIQUET CUFF) ×2 IMPLANT
CUFF TOURNIQUET SINGLE 18IN (TOURNIQUET CUFF) ×6 IMPLANT
DECANTER SPIKE VIAL GLASS SM (MISCELLANEOUS) ×4 IMPLANT
DRAPE EXTREMITY BILATERAL (DRAPE) ×2 IMPLANT
DRAPE OEC MINIVIEW 54X84 (DRAPES) ×4 IMPLANT
DRSG ADAPTIC 3X8 NADH LF (GAUZE/BANDAGES/DRESSINGS) ×6 IMPLANT
DURA STEPPER LG (CAST SUPPLIES) ×2 IMPLANT
DURA STEPPER MED (CAST SUPPLIES) IMPLANT
DURA STEPPER SML (CAST SUPPLIES) IMPLANT
DURA STEPPER XL (SOFTGOODS) IMPLANT
ELECT REM PT RETURN 9FT ADLT (ELECTROSURGICAL) ×4
ELECTRODE REM PT RTRN 9FT ADLT (ELECTROSURGICAL) ×3 IMPLANT
GAUZE KERLIX 2  STERILE LF (GAUZE/BANDAGES/DRESSINGS) ×2 IMPLANT
GLOVE BIO SURGEON STRL SZ7.5 (GLOVE) ×6 IMPLANT
GLOVE ECLIPSE 7.0 STRL STRAW (GLOVE) ×2 IMPLANT
GLOVE EXAM NITRILE MD LF STRL (GLOVE) ×4 IMPLANT
GLOVE INDICATOR 7.0 STRL GRN (GLOVE) ×4 IMPLANT
GLOVE SS BIOGEL STRL SZ 6.5 (GLOVE) ×1 IMPLANT
GLOVE SUPERSENSE BIOGEL SZ 6.5 (GLOVE) ×1
GOWN STRL REIN XL XLG (GOWN DISPOSABLE) ×12 IMPLANT
K-WIRE 6 (WIRE)
KIT ROOM TURNOVER AP CYSTO (KITS) ×4 IMPLANT
KIT ROOM TURNOVER APOR (KITS) ×4 IMPLANT
KWIRE 6 (WIRE) IMPLANT
MANIFOLD NEPTUNE II (INSTRUMENTS) ×4 IMPLANT
NDL HYPO 27GX1-1/4 (NEEDLE) ×6 IMPLANT
NEEDLE HYPO 27GX1-1/4 (NEEDLE) ×12 IMPLANT
NS IRRIG 1000ML POUR BTL (IV SOLUTION) ×4 IMPLANT
PACK BASIC LIMB (CUSTOM PROCEDURE TRAY) ×4 IMPLANT
PAD ARMBOARD 7.5X6 YLW CONV (MISCELLANEOUS) ×4 IMPLANT
PIN CAPS ORTHO GREEN .062 (PIN) IMPLANT
RASP SM TEAR CROSS CUT (RASP) ×2 IMPLANT
SET BASIN LINEN APH (SET/KITS/TRAYS/PACK) ×4 IMPLANT
SPONGE GAUZE 4X4 12PLY (GAUZE/BANDAGES/DRESSINGS) ×6 IMPLANT
SPONGE LAP 18X18 X RAY DECT (DISPOSABLE) ×4 IMPLANT
STAPLE FIXATION 8X8X8 (Staple) ×2 IMPLANT
STRIP CLOSURE SKIN 1/2X4 (GAUZE/BANDAGES/DRESSINGS) ×6 IMPLANT
SUT PROLENE 4 0 PS 2 18 (SUTURE) ×10 IMPLANT
SUT VIC AB 2-0 CT2 27 (SUTURE) ×4 IMPLANT
SUT VIC AB 4-0 PS2 27 (SUTURE) ×6 IMPLANT
SUT VICRYL AB 3-0 FS1 BRD 27IN (SUTURE) ×2 IMPLANT
SYR CONTROL 10ML LL (SYRINGE) ×12 IMPLANT
TOWEL OR 17X26 4PK STRL BLUE (TOWEL DISPOSABLE) ×4 IMPLANT

## 2013-03-17 NOTE — Anesthesia Preprocedure Evaluation (Signed)
Anesthesia Evaluation  Patient identified by MRN, date of birth, ID band Patient awake    Reviewed: Allergy & Precautions, H&P , NPO status , Patient's Chart, lab work & pertinent test results  Airway Mallampati: II TM Distance: >3 FB     Dental no notable dental hx. (+) Teeth Intact, Dental Advisory Given, Partial Upper and Poor Dentition   Pulmonary neg pulmonary ROS,    Pulmonary exam normal       Cardiovascular hypertension, On Medications + CAD + dysrhythmias Atrial Fibrillation Rhythm:Irregular Rate:Normal     Neuro/Psych negative neurological ROS  negative psych ROS   GI/Hepatic Neg liver ROS, GERD-  Medicated,  Endo/Other  negative endocrine ROSHypothyroidism   Renal/GU negative Renal ROS  negative genitourinary   Musculoskeletal   Abdominal   Peds  Hematology negative hematology ROS (+)   Anesthesia Other Findings   Reproductive/Obstetrics negative OB ROS                           Anesthesia Physical Anesthesia Plan  ASA: III  Anesthesia Plan: MAC   Post-op Pain Management:    Induction: Intravenous  Airway Management Planned: Nasal Cannula  Additional Equipment:   Intra-op Plan:   Post-operative Plan:   Informed Consent: I have reviewed the patients History and Physical, chart, labs and discussed the procedure including the risks, benefits and alternatives for the proposed anesthesia with the patient or authorized representative who has indicated his/her understanding and acceptance.     Plan Discussed with:   Anesthesia Plan Comments:         Anesthesia Quick Evaluation

## 2013-03-17 NOTE — Transfer of Care (Signed)
Immediate Anesthesia Transfer of Care Note  Patient: Jerry Cain  Procedure(s) Performed: Procedure(s): BUNIONECTOMY, Arthroplasty 2nd toe right foot (Right) Quintella Reichert OSTEOTOMY (Right) PERCUTANEOUS FLEXOR TENOTOMY 3RD TOE LEFT FOOT (Left)  Patient Location: PACU  Anesthesia Type:MAC  Level of Consciousness: awake and patient cooperative  Airway & Oxygen Therapy: Patient Spontanous Breathing and Patient connected to face mask oxygen  Post-op Assessment: Report given to PACU RN, Post -op Vital signs reviewed and stable and Patient moving all extremities  Post vital signs: Reviewed and stable  Complications: No apparent anesthesia complications

## 2013-03-17 NOTE — H&P (Signed)
HISTORY AND PHYSICAL INTERVAL NOTE:  03/17/2013  10:12 AM  Jerry Cain  has presented today for surgery, with the diagnosis of ulceration 2nd toe right foot, hammer toe 2nd digit right foot hallux valgus right foot, ulceration 3rd toe left foot.  The various methods of treatment have been discussed with the patient.  No guarantees were given.  After consideration of risks, benefits and other options for treatment, the patient has consented to surgery.  I have reviewed the patients' chart and labs.    Patient Vitals for the past 24 hrs:  BP Temp Temp src Pulse Resp SpO2  03/17/13 0954 146/75 mmHg 97.4 F (36.3 C) Oral 57 16 98 %    A history and physical examination was performed in my office.  The patient was reexamined.  There have been no changes to his physical examination.  He is now taking a medication for his bladder.  Dallas Schimke, DPM

## 2013-03-17 NOTE — Op Note (Signed)
OPERATIVE NOTE  DATE OF PROCEDURE:  03/17/2013  SURGEON:   Dallas Schimke, DPM  OR STAFF:   Circulator: Eliane Decree Page, RN Scrub Person: Diana Eves, CST RN First Assistant: Lizabeth Leyden, RN OR Clinical Technician: Raelyn Number, OCT   PREOPERATIVE DIAGNOSIS:   1.  Ulceration 3rd toe, left foot. 2.  Hammertoe deformity 3rd toe, left foot. 3.  Ulceration 2nd toe, right foot. 4.  Hallux valgus, right foot. 5.  Hammertoe 2nd toe, right foot.    POSTOPERATIVE DIAGNOSIS: Same  PROCEDURE: 1.  Flexor tenotomy 3rd toe, left foot. 2.  Silver bunionectomy, right foot. 3.  Akin osteotomy, right foot. 4.  Hammertoe repair 2nd toe, right foot.  ANESTHESIA:  Monitor Anesthesia Care   HEMOSTASIS:   Pneumatic ankle tourniquet set at 250 mmHg  ESTIMATED BLOOD LOSS:   Minimal (<5 cc)  MATERIALS USED:  Stryker Staple  INJECTABLES: Marcaine 0.5% plain  PATHOLOGY:   None  COMPLICATIONS:   None  INDICATIONS:  Chronic ulceration of the left 3rd toe and chronic ulceration of the right 2nd toe that failed to heal with offloading and local wound care.  DESCRIPTION OF THE PROCEDURE:   The patient was brought to the operating room and placed on the operative table in the supine position.  A pneumatic ankle tourniquet was applied to both of the patient's ankle.  Following sedation, each surgical site was anesthetized with 0.5% Marcaine plain.  Each foot was then prepped, scrubbed, and draped in the usual sterile technique.  The left foot was elevated, exsanguinated and the pneumatic ankle tourniquet inflated to 250 mmHg.    Attention was directed to the plantar aspect of the left third toe. Using a #15 blade a percutaneous flexor tenotomy was performed.  The contracture of the distal aspect of the toe was found to be improved. The wound was irrigated. The skin was reapproximate using 4-0 Prolene in a simple suture technique.  A sterile dressing was applied to the left foot.  The pneumatic ankle tourniquet was deflated and a prompt hyperemic response was noted to all digits of the left foot.  Attention was directed to the right foot.  The foot was elevated, exsanguinated and the pneumatic ankle tourniquet inflated to 250 mm mercury.  A linear longitudinal incision was made along the dorsomedial aspect of the right foot.  Dissection was continued deep down to level of the first metatarsophalangeal joint.  A linear longitudinal periosteal and capsular incision was performed.  The periosteal and capsular structures were reflected medially and laterally thus exposing the head of the first metatarsal and proximal phalanx the operative site.  The prominent medial eminence of the first metatarsal head was resected using a power bone saw.  Attention was directed to the proximal phalanx of the hallux.  An Akin osteotomy of the proximal phalanx was performed.  The base of the osteotomy was oriented medially and the apex laterally.  The osteotomy was closed and fixated using a Stryker staple.  Correction of the deformity and position of the hardware was confirmed with C-arm fluoroscopy.  Lateral prominence of the distal phalanx was identified.  A linear longitudinal incision was made overlying this prominence.  Dissection was continued deep down to level the distal phalanx.  A power bone rasp was used to smooth the prominent area.  The wound was irrigated with copious amounts of sterile irrigant.  The periosteal capture structures reapproximated using 2-0 and 4-0 Vicryl.  The skin of both incisions  were reapproximated using 4-0 Prolene in a simple suture technique.   Attention was directed to the dorsal aspect of the second toe.  A linear longitudinal incision was made.  Dissection was continued deep to the level of the proximal interphalangeal joint.  A transverse tenotomy capsulotomy was performed.  The head of the proximal phalanx was resected using a power bone saw.  Persistent contracture  of the second toe was noted.  The extensor hood was released. Persistent contracture of the second toe was noted.  A transverse capsulotomy of the second metatarsophalangeal joint was performed.  The contracture was noted to be improved.  The area was copiously irrigated.  The extensor tendon was reapproximated using 4-0 Vicryl.  The skin was reapproximate using 4-0 Prolene.  A sterile dressing was applied to the right foot.  The pneumatic ankle tourniquet was deflated and a prompt hyperemic response was noted to all digits of the right foot.  The patient tolerated the procedure well.  The patient was then transferred to PACU with vital signs stable and vascular status intact to all toes of both feet.  Following a period of postoperative monitoring, the patient will be discharged home.

## 2013-03-17 NOTE — Op Note (Signed)
Fentanyl 50 mcg given to bob idacavage crna to take to the or

## 2013-03-17 NOTE — Anesthesia Postprocedure Evaluation (Signed)
  Anesthesia Post-op Note  Patient: Jerry Cain  Procedure(s) Performed: Procedure(s): BUNIONECTOMY, Arthroplasty 2nd toe right foot (Right) Quintella Reichert OSTEOTOMY (Right) PERCUTANEOUS FLEXOR TENOTOMY 3RD TOE LEFT FOOT (Left)  Patient Location: PACU  Anesthesia Type:MAC  Level of Consciousness: awake, alert , oriented and patient cooperative  Airway and Oxygen Therapy: Patient Spontanous Breathing  Post-op Pain: 2 /10, mild  Post-op Assessment: Post-op Vital signs reviewed, Patient's Cardiovascular Status Stable, Respiratory Function Stable, Patent Airway, No signs of Nausea or vomiting and Pain level controlled  Post-op Vital Signs: Reviewed and stable  Complications: No apparent anesthesia complications

## 2013-03-18 ENCOUNTER — Encounter (HOSPITAL_COMMUNITY): Payer: Self-pay | Admitting: Podiatry

## 2013-03-20 ENCOUNTER — Encounter (HOSPITAL_COMMUNITY): Payer: Self-pay | Admitting: Emergency Medicine

## 2013-03-20 ENCOUNTER — Emergency Department (HOSPITAL_COMMUNITY): Payer: Medicare Other

## 2013-03-20 ENCOUNTER — Observation Stay (HOSPITAL_COMMUNITY)
Admission: EM | Admit: 2013-03-20 | Discharge: 2013-03-22 | Disposition: A | Discharge status: Home / Self Care | Payer: Medicare Other | Attending: Family Medicine | Admitting: Family Medicine

## 2013-03-20 DIAGNOSIS — M1712 Unilateral primary osteoarthritis, left knee: Secondary | ICD-10-CM | POA: Diagnosis present

## 2013-03-20 DIAGNOSIS — R911 Solitary pulmonary nodule: Secondary | ICD-10-CM | POA: Diagnosis not present

## 2013-03-20 DIAGNOSIS — I2510 Atherosclerotic heart disease of native coronary artery without angina pectoris: Secondary | ICD-10-CM | POA: Insufficient documentation

## 2013-03-20 DIAGNOSIS — M1710 Unilateral primary osteoarthritis, unspecified knee: Secondary | ICD-10-CM | POA: Insufficient documentation

## 2013-03-20 DIAGNOSIS — R6889 Other general symptoms and signs: Secondary | ICD-10-CM | POA: Diagnosis not present

## 2013-03-20 DIAGNOSIS — K219 Gastro-esophageal reflux disease without esophagitis: Secondary | ICD-10-CM | POA: Diagnosis not present

## 2013-03-20 DIAGNOSIS — N400 Enlarged prostate without lower urinary tract symptoms: Secondary | ICD-10-CM | POA: Diagnosis not present

## 2013-03-20 DIAGNOSIS — I10 Essential (primary) hypertension: Secondary | ICD-10-CM | POA: Diagnosis present

## 2013-03-20 DIAGNOSIS — R9431 Abnormal electrocardiogram [ECG] [EKG]: Secondary | ICD-10-CM | POA: Diagnosis not present

## 2013-03-20 DIAGNOSIS — R5082 Postprocedural fever: Secondary | ICD-10-CM | POA: Diagnosis not present

## 2013-03-20 DIAGNOSIS — R509 Fever, unspecified: Secondary | ICD-10-CM | POA: Diagnosis not present

## 2013-03-20 DIAGNOSIS — I4891 Unspecified atrial fibrillation: Secondary | ICD-10-CM | POA: Diagnosis not present

## 2013-03-20 DIAGNOSIS — Z7901 Long term (current) use of anticoagulants: Secondary | ICD-10-CM

## 2013-03-20 DIAGNOSIS — R4182 Altered mental status, unspecified: Secondary | ICD-10-CM | POA: Diagnosis not present

## 2013-03-20 DIAGNOSIS — J189 Pneumonia, unspecified organism: Secondary | ICD-10-CM | POA: Diagnosis not present

## 2013-03-20 DIAGNOSIS — M259 Joint disorder, unspecified: Secondary | ICD-10-CM | POA: Diagnosis not present

## 2013-03-20 DIAGNOSIS — M7989 Other specified soft tissue disorders: Secondary | ICD-10-CM | POA: Diagnosis not present

## 2013-03-20 HISTORY — DX: Unspecified osteoarthritis, unspecified site: M19.90

## 2013-03-20 LAB — URINALYSIS, ROUTINE W REFLEX MICROSCOPIC
Bilirubin Urine: NEGATIVE
Ketones, ur: NEGATIVE mg/dL
Leukocytes, UA: NEGATIVE
Nitrite: NEGATIVE
pH: 6 (ref 5.0–8.0)

## 2013-03-20 LAB — CBC WITH DIFFERENTIAL/PLATELET
Basophils Absolute: 0 10*3/uL (ref 0.0–0.1)
Basophils Relative: 0 % (ref 0–1)
Eosinophils Absolute: 0 10*3/uL (ref 0.0–0.7)
Eosinophils Relative: 0 % (ref 0–5)
HCT: 31.8 % — ABNORMAL LOW (ref 39.0–52.0)
Hemoglobin: 11 g/dL — ABNORMAL LOW (ref 13.0–17.0)
MCH: 31.8 pg (ref 26.0–34.0)
MCHC: 34.6 g/dL (ref 30.0–36.0)
Monocytes Absolute: 0.8 10*3/uL (ref 0.1–1.0)
Monocytes Relative: 12 % (ref 3–12)
Neutro Abs: 5.1 10*3/uL (ref 1.7–7.7)
Platelets: 132 10*3/uL — ABNORMAL LOW (ref 150–400)
RDW: 13.4 % (ref 11.5–15.5)

## 2013-03-20 LAB — HEPATIC FUNCTION PANEL
ALT: 23 U/L (ref 0–53)
Bilirubin, Direct: 0.4 mg/dL — ABNORMAL HIGH (ref 0.0–0.3)
Indirect Bilirubin: 1.1 mg/dL — ABNORMAL HIGH (ref 0.3–0.9)
Total Bilirubin: 1.5 mg/dL — ABNORMAL HIGH (ref 0.3–1.2)
Total Protein: 6.3 g/dL (ref 6.0–8.3)

## 2013-03-20 LAB — BASIC METABOLIC PANEL
BUN: 18 mg/dL (ref 6–23)
Calcium: 8.2 mg/dL — ABNORMAL LOW (ref 8.4–10.5)
Chloride: 97 mEq/L (ref 96–112)
Creatinine, Ser: 1.04 mg/dL (ref 0.50–1.35)
GFR calc Af Amer: 77 mL/min — ABNORMAL LOW (ref >90)
GFR calc non Af Amer: 66 mL/min — ABNORMAL LOW (ref >90)
Potassium: 3.9 mEq/L (ref 3.5–5.1)

## 2013-03-20 LAB — PROTIME-INR
INR: 1.57 — ABNORMAL HIGH (ref 0.00–1.49)
Prothrombin Time: 18.3 seconds — ABNORMAL HIGH (ref 11.6–15.2)

## 2013-03-20 LAB — INFLUENZA PANEL BY PCR (TYPE A & B): Influenza B By PCR: NEGATIVE

## 2013-03-20 LAB — LACTIC ACID, PLASMA: Lactic Acid, Venous: 0.8 mmol/L (ref 0.5–2.2)

## 2013-03-20 LAB — URINE MICROSCOPIC-ADD ON

## 2013-03-20 MED ORDER — POVIDONE-IODINE 10 % EX SOLN
CUTANEOUS | Status: AC
  Administered 2013-03-20: 19:00:00
  Filled 2013-03-20: qty 118

## 2013-03-20 MED ORDER — CIPROFLOXACIN IN D5W 400 MG/200ML IV SOLN
400.0000 mg | Freq: Once | INTRAVENOUS | Status: AC
  Administered 2013-03-20: 400 mg via INTRAVENOUS
  Filled 2013-03-20: qty 200

## 2013-03-20 MED ORDER — IOHEXOL 350 MG/ML SOLN
100.0000 mL | Freq: Once | INTRAVENOUS | Status: AC | PRN
  Administered 2013-03-20: 100 mL via INTRAVENOUS

## 2013-03-20 MED ORDER — HYDROMORPHONE HCL PF 1 MG/ML IJ SOLN
0.5000 mg | INTRAMUSCULAR | Status: DC | PRN
  Administered 2013-03-21: 0.5 mg via INTRAVENOUS
  Filled 2013-03-20: qty 1

## 2013-03-20 MED ORDER — LIDOCAINE HCL (PF) 2 % IJ SOLN
INTRAMUSCULAR | Status: AC
  Filled 2013-03-20: qty 10

## 2013-03-20 MED ORDER — ONDANSETRON HCL 4 MG/2ML IJ SOLN: 4.0000 mg | Freq: Three times a day (TID) | INTRAMUSCULAR | Status: DC | PRN

## 2013-03-20 MED ORDER — DIPHENHYDRAMINE HCL 50 MG/ML IJ SOLN
INTRAMUSCULAR | Status: AC
  Filled 2013-03-20: qty 1

## 2013-03-20 MED ORDER — DIPHENHYDRAMINE HCL 50 MG/ML IJ SOLN
25.0000 mg | Freq: Once | INTRAMUSCULAR | Status: AC
  Administered 2013-03-20: 25 mg via INTRAVENOUS

## 2013-03-20 MED ORDER — SODIUM CHLORIDE 0.9 % IV SOLN
INTRAVENOUS | Status: AC
  Administered 2013-03-21: via INTRAVENOUS

## 2013-03-20 MED ORDER — CIPROFLOXACIN IN D5W 400 MG/200ML IV SOLN: 400.0000 mg | Freq: Once | INTRAVENOUS | Status: DC

## 2013-03-20 MED ORDER — ACETAMINOPHEN 325 MG PO TABS
650.0000 mg | ORAL_TABLET | Freq: Once | ORAL | Status: AC
  Administered 2013-03-20: 650 mg via ORAL
  Filled 2013-03-20: qty 2

## 2013-03-20 NOTE — Consult Note (Signed)
Podiatry Consult Note  Reason for Consultation:  Fever on 3rd post-operative day.  History of Present Illness: Jerry Cain is a 79 y.o. male who had foot surgery on 03/16/2013.  A Silver bunionectomy with akin osteotomy of the proximal phalanx of the right hallux, arthroplasty of the 2nd toe of the right foot and flexor tenotomy of the 3rd digit of the left foot was performed.  His daughter contacted me earlier today.  She related that her father seemed confused.  She was concerned that it could be related to the pain medication.  He had been taking Percocet 10/325 mg 1 tablet po q 6 hours.  He had also been prescribed promethazine 12.5 mg, but he has not been taking it.  He had been evaluated by EMS earlier today.  His vitals were normal per his daughter.  She contacted me earlier this evening relating that she checked his temperature and it was almost 102 degrees F.  I advised her to bring him to the emergency room at [facility] for evaluation.   Past Medical History  Diagnosis Date  . Hypertension   . Hypercholesterolemia   . Hypothyroidism   . Gout   . BPH (benign prostatic hyperplasia)   . CAD (coronary artery disease) 2009    cardiac cath and PTCA by Dr. Jenne Campus  . Tubular adenoma 2001  . Diverticulosis 2007    tcs by Dr. Karilyn Cota  . GERD (gastroesophageal reflux disease)   . Adenomatous colon polyp 2001  . Paroxysmal a-fib 2012    now on coumadin   Scheduled Meds: Continuous Infusions: PRN Meds:.    Allergies  Allergen Reactions  . Penicillins Other (See Comments)    Does not know    Past Surgical History  Procedure Laterality Date  . Hernia repair  2009    left inguinal  . Cataract extraction      bilateral  . Cardiac catheterization  06/11/2007    PTCA X2 in 06/2007:  2 overlapping Cypher stents to LAD (2.5x13 and 3.0x23)  . Colonoscopy  2007    Dr. Karilyn Cota- L sided diverticulosis. Next TCS 2012 due to h/o tubular adenoma.  .  Esophagogastroduodenoscopy  07/2007    Dr. Elmer Ramp  . Colonoscopy  02/26/2011    Procedure: COLONOSCOPY;  Surgeon: Corbin Ade, MD;  Location: [facility] ENDO SUITE;  Service: Endoscopy;  Laterality: N/A;  11:05  . Tee without cardioversion  12/12/2011    Procedure: TRANSESOPHAGEAL ECHOCARDIOGRAM (TEE);  Surgeon: Thurmon Fair, MD;  Location: [facility] ENDOSCOPY;  Service: Cardiovascular;  Laterality: N/A;   successful/sinus rhythm  . Cardioversion  12/12/2011    Procedure: CARDIOVERSION;  Surgeon: Thurmon Fair, MD;  Location: [facility] ENDOSCOPY;  Service: Cardiovascular;  Laterality: N/A;  . Cardiac catheterization  06/21/2007    Cypher stent 2.5 x 13  to OM branch  . Stents    . Bunionectomy Right 03/17/2013    Procedure: BUNIONECTOMY, Arthroplasty 2nd toe right foot;  Surgeon: Dallas Schimke, DPM;  Location: [facility] ORS;  Service: Orthopedics;  Laterality: Right;  . Aiken osteotomy Right 03/17/2013    Procedure: Quintella Reichert OSTEOTOMY;  Surgeon: Dallas Schimke, DPM;  Location: [facility] ORS;  Service: Orthopedics;  Laterality: Right;  . Flexor tenotomy Left 03/17/2013    Procedure: PERCUTANEOUS FLEXOR TENOTOMY 3RD TOE LEFT FOOT;  Surgeon: Dallas Schimke, DPM;  Location: [facility] ORS;  Service: Orthopedics;  Laterality: Left;   Family History  Problem Relation Age of Onset  . Colon cancer Neg Hx   .  Pneumonia Father   . Heart disease Brother     open heart surgery at age 36   Social History:  reports that he has never smoked. He does not have any smokeless tobacco history on file. He reports that he does not drink alcohol or use illicit drugs.  Review of Systems: Fever, left knee pain.    Physical Examination: Vital signs in last 24 hours:   Temp:  [101.3 F (38.5 C)] 101.3 F (38.5 C) (12/14 1857) Pulse Rate:  [92] 92 (12/14 1857) Resp:  [18] 18 (12/14 1857) BP: (137)/(67) 137/67 mmHg (12/14 1857) SpO2:  [92 %] 92 % (12/14 1857)  R foot:  A cam walker is in place.  Dressing is clean,  dry and intact.  Incisions are well approximated.  There is some periwound rubor surrounding the incisions.  This is normal for the post-operative course.  No increase in skin temperature is present.  No active bleeding is present.  Pedal pulses are palpable.  There is edema of the right foot consistent with the post-operative course.  L foot:  A post-operative shoe is in place.  Dressing is clean, dry and intact.  Incision is well approximated with sutures in place.  There are no acute signs of infection present.  No active bleeding is present.  Pedal pulses are palpable.  There is mild edema of the third digit consistent with the post-operative course.    Lab/Test Results:   Recent Labs  03/20/13 1923  WBC 7.0  HGB 11.0*  HCT 31.8*  PLT 132*    No results found for this or any previous visit (from the past 240 hour(s)).   No results found.  Assessment: Fever on post-operative day 3.  Plan: Radiographs are pending.  I personally changed the dressing on both feet.  The emergency room physician was present during the dressing change.  The clinical findings are consistent with the post-operative course.  I do not feel that his surgical wounds are the cause for his post-operative fever.  The emergency room physician is evaluating him for other potential causes of his fever.  I will see him tomorrow if he is admitted.  He is scheduled to follow-up in my [place] office on 03/22/2013 at 4:00 PM.  This appointment will be rescheduled accordingly.   Denaisha Swango IVAN 03/20/2013, 7:40 PM

## 2013-03-20 NOTE — ED Notes (Signed)
Pt reported a itching just above iv site. Pt had received 180 ml of antibiotic. EDP notified.

## 2013-03-20 NOTE — ED Provider Notes (Signed)
CSN: [id number]     Arrival date & time 03/20/13  1850 History   First MD Initiated Contact with Patient 03/20/13 1910     Chief Complaint  Patient presents with  . Fever   (Consider location/radiation/quality/duration/timing/severity/associated sxs/prior Treatment) HPI Comments: Patient presents with one-day history of fever and altered mental status. Patient's family reports she's been having intermittent confusion throughout the day. Temperature at home was 102. Notably he had bilateral foot surgery by Dr. Nolen Mu 4 days ago. He denies any pain in her surgical sites. He complains of pain in his left knee which is an ongoing issue for several months. Denies any falls. He does have a history of gout. He denies any chest pain, shortness of breath. He had a mildly productive cough earlier today. No abdominal pain, nausea or vomiting.  The history is provided by the patient and the spouse.    Past Medical History  Diagnosis Date  . Hypertension   . Hypercholesterolemia   . Hypothyroidism   . Gout   . BPH (benign prostatic hyperplasia)   . CAD (coronary artery disease) 2009    cardiac cath and PTCA by Dr. Jenne Campus  . Tubular adenoma 2001  . Diverticulosis 2007    tcs by Dr. Karilyn Cota  . GERD (gastroesophageal reflux disease)   . Adenomatous colon polyp 2001  . Paroxysmal a-fib 2012    now on coumadin   Past Surgical History  Procedure Laterality Date  . Hernia repair  2009    left inguinal  . Cataract extraction      bilateral  . Cardiac catheterization  06/11/2007    PTCA X2 in 06/2007:  2 overlapping Cypher stents to LAD (2.5x13 and 3.0x23)  . Colonoscopy  2007    Dr. Karilyn Cota- L sided diverticulosis. Next TCS 2012 due to h/o tubular adenoma.  . Esophagogastroduodenoscopy  07/2007    Dr. Elmer Ramp  . Colonoscopy  02/26/2011    Procedure: COLONOSCOPY;  Surgeon: Corbin Ade, MD;  Location: [facility];  Service: Endoscopy;  Laterality: N/A;  11:05  . Tee without  cardioversion  12/12/2011    Procedure: TRANSESOPHAGEAL ECHOCARDIOGRAM (TEE);  Surgeon: Thurmon Fair, MD;  Location: [facility] ENDOSCOPY;  Service: Cardiovascular;  Laterality: N/A;   successful/sinus rhythm  . Cardioversion  12/12/2011    Procedure: CARDIOVERSION;  Surgeon: Thurmon Fair, MD;  Location: [facility] ENDOSCOPY;  Service: Cardiovascular;  Laterality: N/A;  . Cardiac catheterization  06/21/2007    Cypher stent 2.5 x 13  to OM branch  . Stents    . Bunionectomy Right 03/17/2013    Procedure: BUNIONECTOMY, Arthroplasty 2nd toe right foot;  Surgeon: Dallas Schimke, DPM;  Location: [facility];  Service: Orthopedics;  Laterality: Right;  . Aiken osteotomy Right 03/17/2013    Procedure: Quintella Reichert OSTEOTOMY;  Surgeon: Dallas Schimke, DPM;  Location: [facility];  Service: Orthopedics;  Laterality: Right;  . Flexor tenotomy Left 03/17/2013    Procedure: PERCUTANEOUS FLEXOR TENOTOMY 3RD TOE LEFT FOOT;  Surgeon: Dallas Schimke, DPM;  Location: [facility];  Service: Orthopedics;  Laterality: Left;   Family History  Problem Relation Age of Onset  . Colon cancer Neg Hx   . Pneumonia Father   . Heart disease Brother     open heart surgery at age 64   History  Substance Use Topics  . Smoking status: Never Smoker   . Smokeless tobacco: Not on file  . Alcohol Use: No    Review of Systems  Constitutional: Positive for  fever, activity change and appetite change.  Respiratory: Negative for cough, chest tightness and shortness of breath.   Cardiovascular: Negative for chest pain.  Gastrointestinal: Negative for nausea, vomiting and abdominal pain.  Genitourinary: Negative for dysuria.  Musculoskeletal: Positive for arthralgias and myalgias. Negative for back pain.  Skin: Negative for rash.  Neurological: Negative for dizziness, weakness and headaches.  A complete 10 system review of systems was obtained and all systems are negative except as noted in the HPI and PMH.    Allergies   Penicillins  Home Medications   Current Outpatient Rx  Name  Route  Sig  Dispense  Refill  . aspirin 81 MG tablet   Oral   Take 81 mg by mouth every morning.          Marland Kitchen atorvastatin (LIPITOR) 40 MG tablet   Oral   Take 1 tablet (40 mg total) by mouth daily.   30 tablet   5   . BYSTOLIC 5 MG tablet   Oral   Take 2.5 mg by mouth at bedtime. Takes 1/2 tablet         . Calcium Carbonate-Vitamin D (CALCIUM + D) 600-200 MG-UNIT TABS   Oral   Take 1 tablet by mouth 2 (two) times daily.          . finasteride (PROSCAR) 5 MG tablet   Oral   Take 5 mg by mouth every morning.          . Glucosamine HCl (GLUCOSAMINE PO)   Oral   Take 1 tablet by mouth 2 (two) times daily.         . isosorbide mononitrate (IMDUR) 30 MG 24 hr tablet   Oral   Take 30 mg by mouth every morning.         Marland Kitchen levothyroxine (SYNTHROID, LEVOTHROID) 88 MCG tablet   Oral   Take 88 mcg by mouth every morning.         Marland Kitchen lisinopril (PRINIVIL,ZESTRIL) 20 MG tablet   Oral   Take 20 mg by mouth at bedtime.         . Multiple Vitamin (MULTIVITAMIN) capsule   Oral   Take 1 capsule by mouth every morning.          Marland Kitchen oxyCODONE-acetaminophen (PERCOCET) 10-325 MG per tablet   Oral   Take 0.5 tablets by mouth every 6 (six) hours as needed. For pain         . psyllium (METAMUCIL) 58.6 % powder   Oral   Take 1 packet by mouth daily. (3 teaspoonsful once daily at bedtime)         . tamsulosin (FLOMAX) 0.4 MG CAPS capsule   Oral   Take 0.4 mg by mouth at bedtime.          Marland Kitchen warfarin (COUMADIN) 5 MG tablet   Oral   Take 2.5-5 mg by mouth daily. Takes one-half tablet on FRIDAYS ONLY         . ketoconazole (NIZORAL) 2 % cream   Topical   Apply 1 application topically 2 (two) times daily.          . nitroGLYCERIN (NITROSTAT) 0.4 MG SL tablet   Sublingual   Place 1 tablet (0.4 mg total) under the tongue every 5 (five) minutes as needed.   25 tablet   2    BP 126/65  Pulse 77   Temp(Src) 98.3 F (36.8 C) (Oral)  Resp 18  SpO2 95% Physical Exam  Constitutional:  He is oriented to person, place, and time. He appears well-developed and well-nourished. No distress.  HENT:  Head: Normocephalic and atraumatic.  Mouth/Throat: Oropharynx is clear and moist. No oropharyngeal exudate.  Eyes: Conjunctivae and EOM are normal. Pupils are equal, round, and reactive to light.  Neck: Normal range of motion. Neck supple.  No meningismus  Cardiovascular: Normal rate, regular rhythm and normal heart sounds.   Pulmonary/Chest: Effort normal and breath sounds normal. No respiratory distress. He has no wheezes.  Abdominal: Soft. There is no tenderness. There is no rebound and no guarding.  Musculoskeletal: Normal range of motion. He exhibits edema and tenderness.  Left knee with effusion. No erythema or warmth. Flexion and extension intact.  Intact DP and PT pulses bilaterally Healing surgical incisions to right first MTP Healing surgical incision to left third toe  Neurological: He is alert and oriented to person, place, and time. No cranial nerve deficit. He exhibits normal muscle tone. Coordination normal.  Skin: Skin is warm.    ED Course  Procedures (including critical care time) Labs Review Labs Reviewed  CBC WITH DIFFERENTIAL - Abnormal; Notable for the following:    RBC 3.46 (*)    Hemoglobin 11.0 (*)    HCT 31.8 (*)    Platelets 132 (*)    All other components within normal limits  BASIC METABOLIC PANEL - Abnormal; Notable for the following:    Sodium 132 (*)    Glucose, Bld 131 (*)    Calcium 8.2 (*)    GFR calc non Af Amer 66 (*)    GFR calc Af Amer 77 (*)    All other components within normal limits  HEPATIC FUNCTION PANEL - Abnormal; Notable for the following:    Albumin 3.0 (*)    Total Bilirubin 1.5 (*)    Bilirubin, Direct 0.4 (*)    Indirect Bilirubin 1.1 (*)    All other components within normal limits  PROTIME-INR - Abnormal; Notable for the  following:    Prothrombin Time 18.3 (*)    INR 1.57 (*)    All other components within normal limits  URINALYSIS, ROUTINE W REFLEX MICROSCOPIC - Abnormal; Notable for the following:    Hgb urine dipstick MODERATE (*)    Protein, ur TRACE (*)    All other components within normal limits  LACTIC ACID, PLASMA  URIC ACID  INFLUENZA PANEL BY PCR  URINE MICROSCOPIC-ADD ON   Imaging Review Dg Chest 2 View  03/20/2013   CLINICAL DATA:  Fever.  Altered mental status.  EXAM: CHEST  2 VIEW  COMPARISON:  09/10/2011  FINDINGS: The heart size and mediastinal contours are within normal limits. Both lungs are clear. The visualized skeletal structures are unremarkable.  IMPRESSION: No active cardiopulmonary disease.   Electronically Signed   By: Myles Rosenthal M.D.   On: 03/20/2013 20:54   Dg Knee Complete 4 Views Left  03/20/2013   CLINICAL DATA:  Fever.  Knee pain.  EXAM: LEFT KNEE - COMPLETE 4+ VIEW  COMPARISON:  None.  FINDINGS: Severe tricompartmental osteoarthritis of the left knee is present. There is no fracture or acute osseous abnormality. Large loose body is present in the anterior knee joint on the lateral view. Mild lateral subluxation of the tibia. Depression of the medial femoral condyles favored to represent chronic osteoarthritic remodeling or old tibial plateau fracture.  IMPRESSION: Large effusion and severe tricompartmental osteoarthritis.   Electronically Signed   By: Andreas Newport M.D.   On: 03/20/2013 20:53  Dg Foot Complete Left  03/20/2013   CLINICAL DATA:  Fever, prior hammertoe surgery  EXAM: LEFT FOOT - COMPLETE 3+ VIEW  COMPARISON:  03/17/2013  FINDINGS: Osseous mineralization normal.  Joint spaces preserved.  Question soft tissue swelling at 3rd toe.  No acute fracture, dislocation or bone destruction.  IMPRESSION: No acute osseous abnormalities.   Electronically Signed   By: Ulyses Southward M.D.   On: 03/20/2013 20:42   Dg Foot Complete Right  03/20/2013   CLINICAL DATA:  Fever,  recent foot surgery  EXAM: RIGHT FOOT COMPLETE - 3+ VIEW  COMPARISON:  03/17/2013  FINDINGS: Prior osteotomy at the base of the proximal phalanx great toe with staple identified.  Prior resection of the head of the proximal phalanx of the 2nd toe.  Osseous mineralization normal.  Joint spaces preserved.  No acute fracture, dislocation, or bone destruction.  Mild soft tissue swelling at forefoot.  IMPRESSION: Postsurgical changes as above.  Soft tissue swelling.  No acute bony abnormalities.   Electronically Signed   By: Ulyses Southward M.D.   On: 03/20/2013 20:44    EKG Interpretation    Date/Time:  Sunday March 20 2013 19:46:09 EST Ventricular Rate:  82 PR Interval:  210 QRS Duration: 106 QT Interval:  344 QTC Calculation: 401 R Axis:   -41 Text Interpretation:  Sinus rhythm with 1st degree A-V block with Premature supraventricular complexes Left axis deviation Pulmonary disease pattern Incomplete right bundle branch block Abnormal ECG When compared with ECG of 10-Sep-2011 00:34, Premature supraventricular complexes are now Present Vent. rate has increased BY  29 BPM Incomplete right bundle branch block is now Present No significant change was found Confirmed by Manus Gunning  MD, Malak Duchesneau ([id number]) on 03/20/2013 8:04:23 PM            MDM   1. Postoperative fever    Postoperative fever and confusion for the past day. Dr. Nolen Mu has evaluated patient in the ED and evaluated his wounds. He feels they are healing appropriately  Patient is febrile on arrival with borderline hypoxia. His left knee has an effusion but is not warm it is not erythematous and is able to range it. Low suspicion for septic arthritis. Patient is on Coumadin.  Unclear etiology of postoperative fever. Dr. Nolen Mu feels wounds are healing well. He requests Cipro nevertheless. Discussed with the family that his chest x-ray is negative. He has a large effusion of the left knee but no erythema and is able to range it. He  recently restarted his Coumadin. Discussed with the family arthrocentesis of this joint to rule out infection. The family prefers to wait at this time given patient's recent surgery and the fact that he just restarted Coumadin. I explained to him with Coumadin the risk of bleeding from arthrocentesis is minimal. The family prefers to wait and I feel this is reasonable.  UA negative. Flu swab negative.  Will start cipro per Dr. Loralie Champagne request.  Dr. Onalee Hua requests CTPE.  Glynn Octave, MD 03/20/13 [phone]

## 2013-03-20 NOTE — H&P (Signed)
PCP:   Samuel Jester, DO   Chief Complaint:  fever  HPI: 78 yo male recent podiatry surgery 12/10 comes in with 24 huors of chills and fever.  No n/v/d.  Some confusion.  No cp.  No cough.  No sob.  No le edema or swelling.  No rashes.  No abd pain.  No sick contacts.    Review of Systems:  Positive and negative as per HPI otherwise all other systems are negative  Past Medical History: Past Medical History  Diagnosis Date  . Hypertension   . Hypercholesterolemia   . Hypothyroidism   . Gout   . BPH (benign prostatic hyperplasia)   . CAD (coronary artery disease) 2009    cardiac cath and PTCA by Dr. Jenne Campus  . Tubular adenoma 2001  . Diverticulosis 2007    tcs by Dr. Karilyn Cota  . GERD (gastroesophageal reflux disease)   . Adenomatous colon polyp 2001  . Paroxysmal a-fib 2012    now on coumadin   Past Surgical History  Procedure Laterality Date  . Hernia repair  2009    left inguinal  . Cataract extraction      bilateral  . Cardiac catheterization  06/11/2007    PTCA X2 in 06/2007:  2 overlapping Cypher stents to LAD (2.5x13 and 3.0x23)  . Colonoscopy  2007    Dr. Karilyn Cota- L sided diverticulosis. Next TCS 2012 due to h/o tubular adenoma.  . Esophagogastroduodenoscopy  07/2007    Dr. Elmer Ramp  . Colonoscopy  02/26/2011    Procedure: COLONOSCOPY;  Surgeon: Corbin Ade, MD;  Location: [facility];  Service: Endoscopy;  Laterality: N/A;  11:05  . Tee without cardioversion  12/12/2011    Procedure: TRANSESOPHAGEAL ECHOCARDIOGRAM (TEE);  Surgeon: Thurmon Fair, MD;  Location: [facility] ENDOSCOPY;  Service: Cardiovascular;  Laterality: N/A;   successful/sinus rhythm  . Cardioversion  12/12/2011    Procedure: CARDIOVERSION;  Surgeon: Thurmon Fair, MD;  Location: [facility] ENDOSCOPY;  Service: Cardiovascular;  Laterality: N/A;  . Cardiac catheterization  06/21/2007    Cypher stent 2.5 x 13  to OM branch  . Stents    . Bunionectomy Right 03/17/2013    Procedure: BUNIONECTOMY,  Arthroplasty 2nd toe right foot;  Surgeon: Dallas Schimke, DPM;  Location: [facility];  Service: Orthopedics;  Laterality: Right;  . Aiken osteotomy Right 03/17/2013    Procedure: Quintella Reichert OSTEOTOMY;  Surgeon: Dallas Schimke, DPM;  Location: [facility];  Service: Orthopedics;  Laterality: Right;  . Flexor tenotomy Left 03/17/2013    Procedure: PERCUTANEOUS FLEXOR TENOTOMY 3RD TOE LEFT FOOT;  Surgeon: Dallas Schimke, DPM;  Location: [facility];  Service: Orthopedics;  Laterality: Left;    Medications: Prior to Admission medications   Medication Sig Start Date End Date Taking? Authorizing Provider  aspirin 81 MG tablet Take 81 mg by mouth every morning.    Yes Historical Provider, MD  atorvastatin (LIPITOR) 40 MG tablet Take 1 tablet (40 mg total) by mouth daily. 03/02/13  Yes Mihai Croitoru, MD  BYSTOLIC 5 MG tablet Take 2.5 mg by mouth at bedtime. Takes 1/2 tablet 01/15/11  Yes Historical Provider, MD  Calcium Carbonate-Vitamin D (CALCIUM + D) 600-200 MG-UNIT TABS Take 1 tablet by mouth 2 (two) times daily.    Yes Historical Provider, MD  finasteride (PROSCAR) 5 MG tablet Take 5 mg by mouth every morning.  01/28/11  Yes Historical Provider, MD  Glucosamine HCl (GLUCOSAMINE PO) Take 1 tablet by mouth 2 (two) times daily.  Yes Historical Provider, MD  isosorbide mononitrate (IMDUR) 30 MG 24 hr tablet Take 30 mg by mouth every morning.   Yes Historical Provider, MD  levothyroxine (SYNTHROID, LEVOTHROID) 88 MCG tablet Take 88 mcg by mouth every morning.   Yes Historical Provider, MD  lisinopril (PRINIVIL,ZESTRIL) 20 MG tablet Take 20 mg by mouth at bedtime.   Yes Historical Provider, MD  Multiple Vitamin (MULTIVITAMIN) capsule Take 1 capsule by mouth every morning.    Yes Historical Provider, MD  oxyCODONE-acetaminophen (PERCOCET) 10-325 MG per tablet Take 0.5 tablets by mouth every 6 (six) hours as needed. For pain 03/11/13  Yes Historical Provider, MD  psyllium (METAMUCIL) 58.6 % powder  Take 1 packet by mouth daily. (3 teaspoonsful once daily at bedtime)   Yes Historical Provider, MD  tamsulosin (FLOMAX) 0.4 MG CAPS capsule Take 0.4 mg by mouth at bedtime.  03/01/13  Yes Historical Provider, MD  warfarin (COUMADIN) 5 MG tablet Take 2.5-5 mg by mouth daily. Takes one-half tablet on FRIDAYS ONLY   Yes Historical Provider, MD  ketoconazole (NIZORAL) 2 % cream Apply 1 application topically 2 (two) times daily.  09/15/12   Historical Provider, MD  nitroGLYCERIN (NITROSTAT) 0.4 MG SL tablet Place 1 tablet (0.4 mg total) under the tongue every 5 (five) minutes as needed. 12/27/12   Runell Gess, MD    Allergies:   Allergies  Allergen Reactions  . Penicillins Other (See Comments)    Does not know     Social History:  reports that he has never smoked. He does not have any smokeless tobacco history on file. He reports that he does not drink alcohol or use illicit drugs.  Family History: Family History  Problem Relation Age of Onset  . Colon cancer Neg Hx   . Pneumonia Father   . Heart disease Brother     open heart surgery at age 18    Physical Exam: Filed Vitals:   03/20/13 1857 03/20/13 2057 03/20/13 2100  BP: 137/67  126/65  Pulse: 92  77  Temp: 101.3 F (38.5 C) 98.3 F (36.8 C)   TempSrc: Rectal Oral   Resp: 18    SpO2: 92%  95%   General appearance: alert, cooperative and no distress Head: Normocephalic, without obvious abnormality, atraumatic Eyes: negative Nose: Nares normal. Septum midline. Mucosa normal. No drainage or sinus tenderness. Neck: no JVD and supple, symmetrical, trachea midline Lungs: clear to auscultation bilaterally Heart: regular rate and rhythm, S1, S2 normal, no murmur, click, rub or gallop Abdomen: soft, non-tender; bowel sounds normal; no masses,  no organomegaly Extremities: extremities normal, atraumatic, no cyanosis or edema Pulses: 2+ and symmetric Skin: Skin color, texture, turgor normal. No rashes or lesions Neurologic:  Grossly normal    Labs on Admission:   Recent Labs  03/20/13 1923  NA 132*  K 3.9  CL 97  CO2 23  GLUCOSE 131*  BUN 18  CREATININE 1.04  CALCIUM 8.2*    Recent Labs  03/20/13 1924  AST 26  ALT 23  ALKPHOS 50  BILITOT 1.5*  PROT 6.3  ALBUMIN 3.0*    Recent Labs  03/20/13 1923  WBC 7.0  NEUTROABS 5.1  HGB 11.0*  HCT 31.8*  MCV 91.9  PLT 132*    Radiological Exams on Admission: Dg Chest 2 View  03/20/2013   CLINICAL DATA:  Fever.  Altered mental status.  EXAM: CHEST  2 VIEW  COMPARISON:  09/10/2011  FINDINGS: The heart size and mediastinal contours are  within normal limits. Both lungs are clear. The visualized skeletal structures are unremarkable.  IMPRESSION: No active cardiopulmonary disease.   Electronically Signed   By: Myles Rosenthal M.D.   On: 03/20/2013 20:54   Ct Angio Chest Pe W/cm &/or Wo Cm  03/20/2013   CLINICAL DATA:  Pain and swelling in left knee with fever, recent bunionectomy. History of Coronary artery disease, cardiac catheterization and atrial fibrillation.  EXAM: CT ANGIOGRAPHY CHEST WITH CONTRAST  TECHNIQUE: Multidetector CT imaging of the chest was performed using the standard protocol during bolus administration of intravenous contrast. Multiplanar CT image reconstructions including MIPs were obtained to evaluate the vascular anatomy.  CONTRAST:  OMNIPAQUE IOHEXOL 350 MG/ML SOLN  COMPARISON:  Chest radiograph March 20, 2013 at 2021 hr  FINDINGS: Main pulmonary artery is not enlarged. No pulmonary arterial filling defects to the level of the subsegmental branches.  Small bilateral pleural effusions, with patchy mild left greater than right lung base consolidation, minimal ground-glass opacity in the right middle lobe, lingula with mild bronchial wall thickening. Right middle lobe 4 mm anterior segment granuloma. 6 mm right lower lobe smoothly marginated pleural-based low-density nodule.  Tracheobronchial tree is patent, no pneumothorax.  Thoracic aorta is normal in course and caliber with mild calcific atherosclerosis. Heart appears mildly enlarged, pericardium is unremarkable. Apparent Coronary artery stents.  Small axillary lymph nodes are likely reactive. Thoracic esophagus is nonsuspicious.Small hiatal hernia. Remote approximate T12 moderate compression fracture. Mild degenerative change of the thoracic spine.  Review of the MIP images confirms the above findings.  IMPRESSION: No pulmonary embolism.  Mild bibasilar consolidation may reflect atelectasis or even pneumonia, with small bilateral pleural effusions, and patchy ground-glass opacities. Minimal bronchial wall thickening could reflect bronchitis.  6 mm right lung base pleural based nodule is likely benign.   Electronically Signed   By: Awilda Metro   On: 03/20/2013 23:28   Dg Knee Complete 4 Views Left  03/20/2013   CLINICAL DATA:  Fever.  Knee pain.  EXAM: LEFT KNEE - COMPLETE 4+ VIEW  COMPARISON:  None.  FINDINGS: Severe tricompartmental osteoarthritis of the left knee is present. There is no fracture or acute osseous abnormality. Large loose body is present in the anterior knee joint on the lateral view. Mild lateral subluxation of the tibia. Depression of the medial femoral condyles favored to represent chronic osteoarthritic remodeling or old tibial plateau fracture.  IMPRESSION: Large effusion and severe tricompartmental osteoarthritis.   Electronically Signed   By: Andreas Newport M.D.   On: 03/20/2013 20:53   Dg Foot Complete Left  03/20/2013   CLINICAL DATA:  Fever, prior hammertoe surgery  EXAM: LEFT FOOT - COMPLETE 3+ VIEW  COMPARISON:  03/17/2013  FINDINGS: Osseous mineralization normal.  Joint spaces preserved.  Question soft tissue swelling at 3rd toe.  No acute fracture, dislocation or bone destruction.  IMPRESSION: No acute osseous abnormalities.   Electronically Signed   By: Ulyses Southward M.D.   On: 03/20/2013 20:42   Dg Foot Complete Right  03/20/2013    CLINICAL DATA:  Fever, recent foot surgery  EXAM: RIGHT FOOT COMPLETE - 3+ VIEW  COMPARISON:  03/17/2013  FINDINGS: Prior osteotomy at the base of the proximal phalanx great toe with staple identified.  Prior resection of the head of the proximal phalanx of the 2nd toe.  Osseous mineralization normal.  Joint spaces preserved.  No acute fracture, dislocation, or bone destruction.  Mild soft tissue swelling at forefoot.  IMPRESSION: Postsurgical changes as  above.  Soft tissue swelling.  No acute bony abnormalities.   Electronically Signed   By: Ulyses Southward M.D.   On: 03/20/2013 20:44    Assessment/Plan 79 yo male with fever, chills, recent surgical procedure probable early pna  Principal Problem:   PNA (pneumonia) ct shows ??pna.  Will place on levaquin.  Neg for PE.  ua is clean.  Flu is pending.  Vss.  Active Problems:   Atrial fibrillation   Long term (current) use of anticoagulants   HTN (hypertension)   Fever   Altered mental state   Postoperative fever    Kapil Petropoulos A 03/20/2013, 11:35 PM

## 2013-03-20 NOTE — ED Notes (Signed)
Patient arrives via EMS from home with c/o fever, AMS earlier in day. Patient with bilateral foot surgery. Hammer toe surgery on left foot (2nd toe). Callous removed from right foot per patient. Patient alert/oriented x 4.

## 2013-03-20 NOTE — ED Notes (Signed)
Report called to [place], [employer] fund[employer]. Pt to stay in er till CT results.

## 2013-03-20 NOTE — ED Notes (Signed)
Dr Nolen Mu here to changed dressings.

## 2013-03-21 ENCOUNTER — Encounter (HOSPITAL_COMMUNITY): Payer: Self-pay | Admitting: Internal Medicine

## 2013-03-21 DIAGNOSIS — N400 Enlarged prostate without lower urinary tract symptoms: Secondary | ICD-10-CM | POA: Diagnosis present

## 2013-03-21 DIAGNOSIS — I2510 Atherosclerotic heart disease of native coronary artery without angina pectoris: Secondary | ICD-10-CM | POA: Diagnosis present

## 2013-03-21 DIAGNOSIS — R509 Fever, unspecified: Secondary | ICD-10-CM | POA: Diagnosis not present

## 2013-03-21 DIAGNOSIS — I4891 Unspecified atrial fibrillation: Secondary | ICD-10-CM

## 2013-03-21 DIAGNOSIS — J189 Pneumonia, unspecified organism: Secondary | ICD-10-CM | POA: Diagnosis not present

## 2013-03-21 DIAGNOSIS — K219 Gastro-esophageal reflux disease without esophagitis: Secondary | ICD-10-CM | POA: Diagnosis present

## 2013-03-21 DIAGNOSIS — M1712 Unilateral primary osteoarthritis, left knee: Secondary | ICD-10-CM | POA: Diagnosis present

## 2013-03-21 LAB — CBC WITH DIFFERENTIAL/PLATELET
Basophils Absolute: 0 10*3/uL (ref 0.0–0.1)
Basophils Relative: 0 % (ref 0–1)
Eosinophils Absolute: 0.1 10*3/uL (ref 0.0–0.7)
Eosinophils Relative: 1 % (ref 0–5)
HCT: 33.5 % — ABNORMAL LOW (ref 39.0–52.0)
Hemoglobin: 11.5 g/dL — ABNORMAL LOW (ref 13.0–17.0)
MCH: 31.6 pg (ref 26.0–34.0)
MCHC: 34.3 g/dL (ref 30.0–36.0)
MCV: 92 fL (ref 78.0–100.0)
Monocytes Relative: 15 % — ABNORMAL HIGH (ref 3–12)
Neutrophils Relative %: 63 % (ref 43–77)
Platelets: 150 10*3/uL (ref 150–400)
RBC: 3.64 MIL/uL — ABNORMAL LOW (ref 4.22–5.81)
RDW: 13.4 % (ref 11.5–15.5)

## 2013-03-21 LAB — PROTIME-INR
INR: 1.6 — ABNORMAL HIGH (ref 0.00–1.49)
Prothrombin Time: 18.6 seconds — ABNORMAL HIGH (ref 11.6–15.2)

## 2013-03-21 LAB — BASIC METABOLIC PANEL
BUN: 16 mg/dL (ref 6–23)
Calcium: 8.6 mg/dL (ref 8.4–10.5)
Creatinine, Ser: 1 mg/dL (ref 0.50–1.35)
GFR calc non Af Amer: 69 mL/min — ABNORMAL LOW (ref >90)
Glucose, Bld: 97 mg/dL (ref 70–99)
Potassium: 3.7 mEq/L (ref 3.5–5.1)
Sodium: 135 mEq/L (ref 135–145)

## 2013-03-21 LAB — STREP PNEUMONIAE URINARY ANTIGEN: Strep Pneumo Urinary Antigen: NEGATIVE

## 2013-03-21 MED ORDER — WARFARIN SODIUM 5 MG PO TABS
5.0000 mg | ORAL_TABLET | ORAL | Status: DC
  Administered 2013-03-21: 5 mg via ORAL
  Filled 2013-03-21: qty 1

## 2013-03-21 MED ORDER — ISOSORBIDE MONONITRATE ER 60 MG PO TB24
30.0000 mg | ORAL_TABLET | Freq: Every morning | ORAL | Status: DC
  Administered 2013-03-21 – 2013-03-22 (×2): 30 mg via ORAL
  Filled 2013-03-21 (×2): qty 1

## 2013-03-21 MED ORDER — WARFARIN - PHYSICIAN DOSING INPATIENT
Freq: Every day | Status: DC
  Administered 2013-03-21: 1

## 2013-03-21 MED ORDER — ACETAMINOPHEN 325 MG PO TABS: 650.0000 mg | ORAL_TABLET | Freq: Four times a day (QID) | ORAL | Status: DC | PRN

## 2013-03-21 MED ORDER — SODIUM CHLORIDE 0.9 % IJ SOLN: 3.0000 mL | INTRAMUSCULAR | Status: DC | PRN

## 2013-03-21 MED ORDER — LEVOTHYROXINE SODIUM 88 MCG PO TABS
88.0000 ug | ORAL_TABLET | Freq: Every day | ORAL | Status: DC
  Administered 2013-03-21 – 2013-03-22 (×2): 88 ug via ORAL
  Filled 2013-03-21 (×3): qty 1

## 2013-03-21 MED ORDER — SODIUM CHLORIDE 0.9 % IV SOLN: 250.0000 mL | INTRAVENOUS | Status: DC | PRN

## 2013-03-21 MED ORDER — WARFARIN SODIUM 2.5 MG PO TABS: 2.5000 mg | ORAL_TABLET | ORAL | Status: DC

## 2013-03-21 MED ORDER — WARFARIN SODIUM 2.5 MG PO TABS: 2.5000 mg | ORAL_TABLET | Freq: Every day | ORAL | Status: DC

## 2013-03-21 MED ORDER — LISINOPRIL 10 MG PO TABS
20.0000 mg | ORAL_TABLET | Freq: Every day | ORAL | Status: DC
  Administered 2013-03-21 (×2): 20 mg via ORAL
  Filled 2013-03-21 (×2): qty 2

## 2013-03-21 MED ORDER — NEBIVOLOL HCL 2.5 MG PO TABS
ORAL_TABLET | ORAL | Status: AC
  Filled 2013-03-21: qty 1

## 2013-03-21 MED ORDER — ATORVASTATIN CALCIUM 40 MG PO TABS
40.0000 mg | ORAL_TABLET | Freq: Every day | ORAL | Status: DC
  Administered 2013-03-21 (×2): 40 mg via ORAL
  Filled 2013-03-21 (×2): qty 1

## 2013-03-21 MED ORDER — LEVOFLOXACIN 750 MG PO TABS
750.0000 mg | ORAL_TABLET | ORAL | Status: DC
  Administered 2013-03-21 – 2013-03-22 (×2): 750 mg via ORAL
  Filled 2013-03-21: qty 1

## 2013-03-21 MED ORDER — ASPIRIN 81 MG PO CHEW
81.0000 mg | CHEWABLE_TABLET | Freq: Every morning | ORAL | Status: DC
  Administered 2013-03-21 – 2013-03-22 (×2): 81 mg via ORAL
  Filled 2013-03-21 (×2): qty 1

## 2013-03-21 MED ORDER — ENSURE COMPLETE PO LIQD
237.0000 mL | Freq: Two times a day (BID) | ORAL | Status: DC
  Administered 2013-03-21 – 2013-03-22 (×2): 237 mL via ORAL

## 2013-03-21 MED ORDER — POLYVINYL ALCOHOL 1.4 % OP SOLN
1.0000 [drp] | OPHTHALMIC | Status: DC | PRN
  Filled 2013-03-21: qty 15

## 2013-03-21 MED ORDER — NEBIVOLOL HCL 2.5 MG PO TABS
2.5000 mg | ORAL_TABLET | Freq: Every day | ORAL | Status: DC
  Administered 2013-03-21 (×2): 2.5 mg via ORAL
  Filled 2013-03-21 (×3): qty 1

## 2013-03-21 MED ORDER — TAMSULOSIN HCL 0.4 MG PO CAPS
0.4000 mg | ORAL_CAPSULE | Freq: Every day | ORAL | Status: DC
  Administered 2013-03-21 (×2): 0.4 mg via ORAL
  Filled 2013-03-21 (×2): qty 1

## 2013-03-21 MED ORDER — LEVOFLOXACIN 750 MG PO TABS
750.0000 mg | ORAL_TABLET | Freq: Every day | ORAL | Status: DC
  Filled 2013-03-21: qty 1

## 2013-03-21 MED ORDER — FINASTERIDE 5 MG PO TABS
5.0000 mg | ORAL_TABLET | Freq: Every morning | ORAL | Status: DC
  Administered 2013-03-21 – 2013-03-22 (×2): 5 mg via ORAL
  Filled 2013-03-21 (×3): qty 1

## 2013-03-21 MED ORDER — OXYCODONE-ACETAMINOPHEN 5-325 MG PO TABS
1.0000 | ORAL_TABLET | Freq: Four times a day (QID) | ORAL | Status: DC | PRN
  Administered 2013-03-21 (×2): 1 via ORAL
  Filled 2013-03-21 (×2): qty 1

## 2013-03-21 MED ORDER — SODIUM CHLORIDE 0.9 % IJ SOLN
3.0000 mL | Freq: Two times a day (BID) | INTRAMUSCULAR | Status: DC
  Administered 2013-03-21 (×3): 3 mL via INTRAVENOUS

## 2013-03-21 NOTE — Progress Notes (Signed)
Patient started on Levaquin for CAP.  Renal function is at patient's baseline.  Antibiotic doses reviewed for renal function (CrCl>6ml/min); doses ordered appropriate for current renal function.   Plan: Continue levaquin 750mg  po daily x5 days. Pharmacy will sign off.  Please re-consult if needed.   Thank you, Junita Push, PharmD, BCPS

## 2013-03-21 NOTE — Progress Notes (Signed)
[facility] PROGRESS NOTE  Jerry Cain WGN:[id number] DOB: 11-13-1931 DOA: 03/20/2013 PCP: Samuel Jester, DO  Assessment/Plan: Principal Problem:  CAP.  ct shows ??pna. Feels better this am. Max temp last 24 hours 101.3 last evening. Legionella antigen and strep pneumoniae in process.  Continue Levaquin day #2/5. CT chest negative for PE. Urinalysis unremarkable. Surgical sites evaluated and no sign infection.  Flu by pcr negative. No leukocytosis and non-toxic appearing.    Active Problems:  Altered mental state: likely related to above with fever. Resolved this am. Continue to monitor   Postoperative fever: Likely related to #1 as surgical sites evaluated in ED and not thought to be source.  Max temp 101.3 last evening. This am 100.5 orally.  Blood cultures pending. BP trending downward slightly with SBP 115, HR trending up slightly at 89. Will resume IV fluids for gently hydration. Monitor VS q4hrs x3.   Atrial fibrillation: rate controlled.  INR 1.6 this am. Continue coumadin per pharmacy. Continue BB  Long term (current) use of anticoagulants: secondary to afib. See above   HTN (hypertension) fair control. SBP range 115-146. Continue lisinopril and BB. Monitor closely and adjust medication as needed.   CAD: recently evaluated by cardiology in preparation for surgery. Cards note indicate echo 1 year ago with preserved left ventricular systolic function and no valvular abnormalities as well as a normal stress nuclear myocardial scan 9/13. Continue home medications   Osteo arthritis: xray of left knee yields large effusion and severe tricompartmental osteoarthritis. Hoping for knee replacement once feet heal.   Gerd: appears stable at baseline.   Code Status: full Family Communication: daughter at bedside  Disposition Plan: home when ready   Consultants:  none  Procedures:  none  Antibiotics:  Levaquin 03/20/13  HPI/Subjective: Reports feeling better  this am but remains "hot from that coffee i drank".   Objective: Filed Vitals:   03/21/13 0505  BP: 115/72  Pulse: 89  Temp: 100.5 F (38.1 C)  Resp: 18    Intake/Output Summary (Last 24 hours) at 03/21/13 0947 Last data filed at 03/21/13 7846  Gross per 24 hour  Intake    240 ml  Output      0 ml  Net    240 ml   Filed Weights   03/21/13 0001  Weight: 92.715 kg (204 lb 6.4 oz)    Exam:   General:  Well nourished calm NAD  Cardiovascular: RRR No m/g/r. Bilateral LE with dressings dry and intact.   Respiratory: normal effort. BS diminished particularly bilateral bases. No wheeze or rhonchi  Abdomen: round but non-distended. Soft with +BS throughout. Non-tender to palpation  Musculoskeletal: bilateral LE dressings to feet with blue boots. Toes warm to touch without erythema or swelling. Left knees somewhat swollen without erythema, heat. Somewhat tender to palpation and decreased ROM.    Data Reviewed: Basic Metabolic Panel:  Recent Labs Lab 03/14/13 1100 03/20/13 1923 03/21/13 0200  NA 140 132* 135  K 4.4 3.9 3.7  CL 105 97 101  CO2 27 23 24   GLUCOSE 87 131* 97  BUN 22 18 16   CREATININE 0.95 1.04 1.00  CALCIUM 9.0 8.2* 8.6   Liver Function Tests:  Recent Labs Lab 03/20/13 1924  AST 26  ALT 23  ALKPHOS 50  BILITOT 1.5*  PROT 6.3  ALBUMIN 3.0*   No results found for this basename: LIPASE, AMYLASE,  in the last 168 hours No results found for this basename: AMMONIA,  in the last  168 hours CBC:  Recent Labs Lab 03/14/13 1100 03/20/13 1923 03/21/13 0200  WBC  --  7.0 6.8  NEUTROABS  --  5.1 4.3  HGB 12.6* 11.0* 11.5*  HCT 37.6* 31.8* 33.5*  MCV  --  91.9 92.0  PLT  --  132* 150   Cardiac Enzymes: No results found for this basename: CKTOTAL, CKMB, CKMBINDEX, TROPONINI,  in the last 168 hours BNP (last 3 results) No results found for this basename: PROBNP,  in the last 8760 hours CBG: No results found for this basename: GLUCAP,  in the  last 168 hours  Recent Results (from the past 240 hour(s))  CULTURE, BLOOD (ROUTINE X 2)     Status: None   Collection Time    03/21/13  1:51 AM      Result Value Range Status   Specimen Description BLOOD RIGHT ANTECUBITAL   Final   Special Requests     Final   Value: BOTTLES DRAWN AEROBIC AND ANAEROBIC AEB 10CC ANA 8CC   Culture PENDING   Incomplete   Report Status PENDING   Incomplete  CULTURE, BLOOD (ROUTINE X 2)     Status: None   Collection Time    03/21/13  2:00 AM      Result Value Range Status   Specimen Description BLOOD RIGHT HAND   Final   Special Requests     Final   Value: BOTTLES DRAWN AEROBIC AND ANAEROBIC AEB 10CC ANA 3CC   Culture PENDING   Incomplete   Report Status PENDING   Incomplete     Studies: Dg Chest 2 View  03/20/2013   CLINICAL DATA:  Fever.  Altered mental status.  EXAM: CHEST  2 VIEW  COMPARISON:  09/10/2011  FINDINGS: The heart size and mediastinal contours are within normal limits. Both lungs are clear. The visualized skeletal structures are unremarkable.  IMPRESSION: No active cardiopulmonary disease.   Electronically Signed   By: Myles Rosenthal M.D.   On: 03/20/2013 20:54   Ct Angio Chest Pe W/cm &/or Wo Cm  03/20/2013   CLINICAL DATA:  Pain and swelling in left knee with fever, recent bunionectomy. History of Coronary artery disease, cardiac catheterization and atrial fibrillation.  EXAM: CT ANGIOGRAPHY CHEST WITH CONTRAST  TECHNIQUE: Multidetector CT imaging of the chest was performed using the standard protocol during bolus administration of intravenous contrast. Multiplanar CT image reconstructions including MIPs were obtained to evaluate the vascular anatomy.  CONTRAST:  OMNIPAQUE IOHEXOL 350 MG/ML SOLN  COMPARISON:  Chest radiograph March 20, 2013 at 2021 hr  FINDINGS: Main pulmonary artery is not enlarged. No pulmonary arterial filling defects to the level of the subsegmental branches.  Small bilateral pleural effusions, with patchy mild  left greater than right lung base consolidation, minimal ground-glass opacity in the right middle lobe, lingula with mild bronchial wall thickening. Right middle lobe 4 mm anterior segment granuloma. 6 mm right lower lobe smoothly marginated pleural-based low-density nodule.  Tracheobronchial tree is patent, no pneumothorax. Thoracic aorta is normal in course and caliber with mild calcific atherosclerosis. Heart appears mildly enlarged, pericardium is unremarkable. Apparent Coronary artery stents.  Small axillary lymph nodes are likely reactive. Thoracic esophagus is nonsuspicious.Small hiatal hernia. Remote approximate T12 moderate compression fracture. Mild degenerative change of the thoracic spine.  Review of the MIP images confirms the above findings.  IMPRESSION: No pulmonary embolism.  Mild bibasilar consolidation may reflect atelectasis or even pneumonia, with small bilateral pleural effusions, and patchy ground-glass opacities. Minimal bronchial  wall thickening could reflect bronchitis.  6 mm right lung base pleural based nodule is likely benign.   Electronically Signed   By: Awilda Metro   On: 03/20/2013 23:28   Dg Knee Complete 4 Views Left  03/20/2013   CLINICAL DATA:  Fever.  Knee pain.  EXAM: LEFT KNEE - COMPLETE 4+ VIEW  COMPARISON:  None.  FINDINGS: Severe tricompartmental osteoarthritis of the left knee is present. There is no fracture or acute osseous abnormality. Large loose body is present in the anterior knee joint on the lateral view. Mild lateral subluxation of the tibia. Depression of the medial femoral condyles favored to represent chronic osteoarthritic remodeling or old tibial plateau fracture.  IMPRESSION: Large effusion and severe tricompartmental osteoarthritis.   Electronically Signed   By: Andreas Newport M.D.   On: 03/20/2013 20:53   Dg Foot Complete Left  03/20/2013   CLINICAL DATA:  Fever, prior hammertoe surgery  EXAM: LEFT FOOT - COMPLETE 3+ VIEW  COMPARISON:   03/17/2013  FINDINGS: Osseous mineralization normal.  Joint spaces preserved.  Question soft tissue swelling at 3rd toe.  No acute fracture, dislocation or bone destruction.  IMPRESSION: No acute osseous abnormalities.   Electronically Signed   By: Ulyses Southward M.D.   On: 03/20/2013 20:42   Dg Foot Complete Right  03/20/2013   CLINICAL DATA:  Fever, recent foot surgery  EXAM: RIGHT FOOT COMPLETE - 3+ VIEW  COMPARISON:  03/17/2013  FINDINGS: Prior osteotomy at the base of the proximal phalanx great toe with staple identified.  Prior resection of the head of the proximal phalanx of the 2nd toe.  Osseous mineralization normal.  Joint spaces preserved.  No acute fracture, dislocation, or bone destruction.  Mild soft tissue swelling at forefoot.  IMPRESSION: Postsurgical changes as above.  Soft tissue swelling.  No acute bony abnormalities.   Electronically Signed   By: Ulyses Southward M.D.   On: 03/20/2013 20:44    Scheduled Meds: . sodium chloride   Intravenous STAT  . aspirin  81 mg Oral q morning - 10a  . atorvastatin  40 mg Oral Daily  . finasteride  5 mg Oral q morning - 10a  . isosorbide mononitrate  30 mg Oral q morning - 10a  . levofloxacin  750 mg Oral Q24H  . levothyroxine  88 mcg Oral QAC breakfast  . lisinopril  20 mg Oral QHS  . nebivolol  2.5 mg Oral QHS  . sodium chloride  3 mL Intravenous Q12H  . tamsulosin  0.4 mg Oral QHS  . [START ON 03/25/2013] warfarin  2.5 mg Oral Custom  . warfarin  5 mg Oral Custom  . Warfarin - Physician Dosing Inpatient   Does not apply q1800   Continuous Infusions:   Principal Problem:   PNA (pneumonia) Active Problems:   Atrial fibrillation   Long term (current) use of anticoagulants   HTN (hypertension)   Fever   Altered mental state   Postoperative fever   BPH (benign prostatic hyperplasia)   GERD (gastroesophageal reflux disease)   CAD (coronary artery disease)   Osteoarthritis of left knee    Time spent: 35 minutes    [facility]  M  [facility] Pager [phone]. If 7PM-7AM, please contact night-coverage at [email or web address], password [facility] 03/21/2013, 9:47 AM  LOS: 1 day

## 2013-03-21 NOTE — Progress Notes (Signed)
Patient seen, independently examined and chart reviewed. I agree with exam, assessment and plan discussed with Toya Smothers, NP.  79 year old man status post podiatry surgery 12/11 presents the emergency department with history of fever and confusion. Screening laboratory studies were unremarkable, CT of the chest suggestive of developing pneumonia and the patient was started on empiric antibiotics. Large left knee effusion was noted but family refused arthrocentesis. X-ray revealed large loose body as well as severe tricompartmental osteoarthritis  Afebrile, vital signs stable. Feels much better today. No complaints. He appears calm and comfortable. His lungs are clear and heart sounds are normal. Left knee effusion nontender, no erythema. He and his wife report this is long-standing, chronic.  Basic metabolic panel and CBC unremarkable, INR subtherapeutic  Rapidly improving on empiric treatment for community acquired pneumonia. Acute encephalopathy resolved, likely secondary to pain medication. Continue current antibiotics. Anticipate discharge home 12/16.   Brendia Sacks, MD [facility] [phone]

## 2013-03-21 NOTE — Progress Notes (Signed)
Podiatry Progress Note  Subjective: Jerry Cain is a 79 y.o. male who had foot surgery on 03/16/2013.  He was admitted last night from the emergency room for treatment of pneumonia.  He denies any problems with his foot.   Past Medical History  Diagnosis Date  . Hypertension   . Hypercholesterolemia   . Hypothyroidism   . Gout   . BPH (benign prostatic hyperplasia)   . CAD (coronary artery disease) 2009    cardiac cath and PTCA by Dr. Jenne Campus  . Tubular adenoma 2001  . Diverticulosis 2007    tcs by Dr. Karilyn Cota  . GERD (gastroesophageal reflux disease)   . Adenomatous colon polyp 2001  . Paroxysmal a-fib 2012    now on coumadin  . Osteoarthritis     left knee   Scheduled Meds: Continuous Infusions: PRN Meds:.acetaminophen, oxyCODONE-acetaminophen, polyvinyl alcohol, sodium chloride  Allergies  Allergen Reactions  . Penicillins Other (See Comments)    Does not know    Past Surgical History  Procedure Laterality Date  . Hernia repair  2009    left inguinal  . Cataract extraction      bilateral  . Cardiac catheterization  06/11/2007    PTCA X2 in 06/2007:  2 overlapping Cypher stents to LAD (2.5x13 and 3.0x23)  . Colonoscopy  2007    Dr. Karilyn Cota- L sided diverticulosis. Next TCS 2012 due to h/o tubular adenoma.  . Esophagogastroduodenoscopy  07/2007    Dr. Elmer Ramp  . Colonoscopy  02/26/2011    Procedure: COLONOSCOPY;  Surgeon: Corbin Ade, MD;  Location: [facility];  Service: Endoscopy;  Laterality: N/A;  11:05  . Tee without cardioversion  12/12/2011    Procedure: TRANSESOPHAGEAL ECHOCARDIOGRAM (TEE);  Surgeon: Thurmon Fair, MD;  Location: [facility] ENDOSCOPY;  Service: Cardiovascular;  Laterality: N/A;   successful/sinus rhythm  . Cardioversion  12/12/2011    Procedure: CARDIOVERSION;  Surgeon: Thurmon Fair, MD;  Location: [facility] ENDOSCOPY;  Service: Cardiovascular;  Laterality: N/A;  . Cardiac catheterization  06/21/2007    Cypher stent 2.5 x 13  to OM  branch  . Stents    . Bunionectomy Right 03/17/2013    Procedure: BUNIONECTOMY, Arthroplasty 2nd toe right foot;  Surgeon: Dallas Schimke, DPM;  Location: [facility] ORS;  Service: Orthopedics;  Laterality: Right;  . Aiken osteotomy Right 03/17/2013    Procedure: Quintella Reichert OSTEOTOMY;  Surgeon: Dallas Schimke, DPM;  Location: [facility] ORS;  Service: Orthopedics;  Laterality: Right;  . Flexor tenotomy Left 03/17/2013    Procedure: PERCUTANEOUS FLEXOR TENOTOMY 3RD TOE LEFT FOOT;  Surgeon: Dallas Schimke, DPM;  Location: [facility] ORS;  Service: Orthopedics;  Laterality: Left;   Family History  Problem Relation Age of Onset  . Colon cancer Neg Hx   . Pneumonia Father   . Heart disease Brother     open heart surgery at age 32   Social History:  reports that he has never smoked. He does not have any smokeless tobacco history on file. He reports that he does not drink alcohol or use illicit drugs.  Review of Systems: Fever, left knee pain.    Physical Examination: Vital signs in last 24 hours:   Temp:  [98 F (36.7 C)-100.5 F (38.1 C)] 99.6 F (37.6 C) (12/15 1651) Pulse Rate:  [69-89] 77 (12/15 1651) Resp:  [16-20] 20 (12/15 1651) BP: (115-146)/(58-77) 135/62 mmHg (12/15 1651) SpO2:  [94 %-98 %] 98 % (12/15 1651) Weight:  [204 lb 6.4 oz (92.715 kg)] 204 lb  6.4 oz (92.715 kg) (12/15 0001)  R foot:  A cam walker is in place.  Dressing is clean, dry and intact.   L foot:  A post-operative shoe is in place.  Dressing is clean, dry and intact.   Lab/Test Results:    Recent Labs  03/20/13 1923 03/21/13 0200  WBC 7.0 6.8  HGB 11.0* 11.5*  HCT 31.8* 33.5*  PLT 132* 150  NA 132* 135  K 3.9 3.7  CL 97 101  CO2 23 24  BUN 18 16  CREATININE 1.04 1.00  GLUCOSE 131* 97  CALCIUM 8.2* 8.6    Recent Results (from the past 240 hour(s))  CULTURE, BLOOD (ROUTINE X 2)     Status: None   Collection Time    03/21/13  1:51 AM      Result Value Range Status   Specimen Description  BLOOD RIGHT ANTECUBITAL   Final   Special Requests     Final   Value: BOTTLES DRAWN AEROBIC AND ANAEROBIC AEB 10CC ANA 8CC   Culture NO GROWTH <24 HRS   Final   Report Status PENDING   Incomplete  CULTURE, BLOOD (ROUTINE X 2)     Status: None   Collection Time    03/21/13  2:00 AM      Result Value Range Status   Specimen Description BLOOD RIGHT HAND   Final   Special Requests     Final   Value: BOTTLES DRAWN AEROBIC AND ANAEROBIC AEB 10CC ANA 3CC   Culture NO GROWTH <24 HRS   Final   Report Status PENDING   Incomplete     Dg Chest 2 View  03/20/2013   CLINICAL DATA:  Fever.  Altered mental status.  EXAM: CHEST  2 VIEW  COMPARISON:  09/10/2011  FINDINGS: The heart size and mediastinal contours are within normal limits. Both lungs are clear. The visualized skeletal structures are unremarkable.  IMPRESSION: No active cardiopulmonary disease.   Electronically Signed   By: Myles Rosenthal M.D.   On: 03/20/2013 20:54   Ct Angio Chest Pe W/cm &/or Wo Cm  03/20/2013   CLINICAL DATA:  Pain and swelling in left knee with fever, recent bunionectomy. History of Coronary artery disease, cardiac catheterization and atrial fibrillation.  EXAM: CT ANGIOGRAPHY CHEST WITH CONTRAST  TECHNIQUE: Multidetector CT imaging of the chest was performed using the standard protocol during bolus administration of intravenous contrast. Multiplanar CT image reconstructions including MIPs were obtained to evaluate the vascular anatomy.  CONTRAST:  OMNIPAQUE IOHEXOL 350 MG/ML SOLN  COMPARISON:  Chest radiograph March 20, 2013 at 2021 hr  FINDINGS: Main pulmonary artery is not enlarged. No pulmonary arterial filling defects to the level of the subsegmental branches.  Small bilateral pleural effusions, with patchy mild left greater than right lung base consolidation, minimal ground-glass opacity in the right middle lobe, lingula with mild bronchial wall thickening. Right middle lobe 4 mm anterior segment granuloma. 6 mm  right lower lobe smoothly marginated pleural-based low-density nodule.  Tracheobronchial tree is patent, no pneumothorax. Thoracic aorta is normal in course and caliber with mild calcific atherosclerosis. Heart appears mildly enlarged, pericardium is unremarkable. Apparent Coronary artery stents.  Small axillary lymph nodes are likely reactive. Thoracic esophagus is nonsuspicious.Small hiatal hernia. Remote approximate T12 moderate compression fracture. Mild degenerative change of the thoracic spine.  Review of the MIP images confirms the above findings.  IMPRESSION: No pulmonary embolism.  Mild bibasilar consolidation may reflect atelectasis or even pneumonia, with small bilateral  pleural effusions, and patchy ground-glass opacities. Minimal bronchial wall thickening could reflect bronchitis.  6 mm right lung base pleural based nodule is likely benign.   Electronically Signed   By: Awilda Metro   On: 03/20/2013 23:28   Dg Knee Complete 4 Views Left  03/20/2013   CLINICAL DATA:  Fever.  Knee pain.  EXAM: LEFT KNEE - COMPLETE 4+ VIEW  COMPARISON:  None.  FINDINGS: Severe tricompartmental osteoarthritis of the left knee is present. There is no fracture or acute osseous abnormality. Large loose body is present in the anterior knee joint on the lateral view. Mild lateral subluxation of the tibia. Depression of the medial femoral condyles favored to represent chronic osteoarthritic remodeling or old tibial plateau fracture.  IMPRESSION: Large effusion and severe tricompartmental osteoarthritis.   Electronically Signed   By: Andreas Newport M.D.   On: 03/20/2013 20:53   Dg Foot Complete Left  03/20/2013   CLINICAL DATA:  Fever, prior hammertoe surgery  EXAM: LEFT FOOT - COMPLETE 3+ VIEW  COMPARISON:  03/17/2013  FINDINGS: Osseous mineralization normal.  Joint spaces preserved.  Question soft tissue swelling at 3rd toe.  No acute fracture, dislocation or bone destruction.  IMPRESSION: No acute osseous  abnormalities.   Electronically Signed   By: Ulyses Southward M.D.   On: 03/20/2013 20:42   Dg Foot Complete Right  03/20/2013   CLINICAL DATA:  Fever, recent foot surgery  EXAM: RIGHT FOOT COMPLETE - 3+ VIEW  COMPARISON:  03/17/2013  FINDINGS: Prior osteotomy at the base of the proximal phalanx great toe with staple identified.  Prior resection of the head of the proximal phalanx of the 2nd toe.  Osseous mineralization normal.  Joint spaces preserved.  No acute fracture, dislocation, or bone destruction.  Mild soft tissue swelling at forefoot.  IMPRESSION: Postsurgical changes as above.  Soft tissue swelling.  No acute bony abnormalities.   Electronically Signed   By: Ulyses Southward M.D.   On: 03/20/2013 20:44    Assessment: Fever on post-operative day 4.  Plan: I will change his surgical dressing tomorrow.  He is scheduled to follow-up in my [place] office on 03/22/2013 at 4:00 PM.  This appointment will be rescheduled accordingly.   Bryttani Blew IVAN 03/21/2013, 7:00 PM

## 2013-03-21 NOTE — Progress Notes (Signed)
UR completed 

## 2013-03-21 NOTE — Progress Notes (Signed)
Nutrition Brief Note  Patient identified on the Malnutrition Screening Tool (MST) Report  Presented with fever and altered mental status. Recent foot surgery.  Wt Readings from Last 15 Encounters:  03/21/13 204 lb 6.4 oz (92.715 kg)  03/14/13 199 lb (90.266 kg)  11/24/12 190 lb 8 oz (86.41 kg)  10/05/12 194 lb (87.998 kg)  09/23/12 180 lb (81.647 kg)  09/22/12 197 lb 8 oz (89.585 kg)  08/27/12 175 lb (79.379 kg)  02/04/12 191 lb (86.637 kg)  12/12/11 180 lb (81.647 kg)  12/12/11 180 lb (81.647 kg)  09/10/11 180 lb (81.647 kg)  02/26/11 175 lb (79.379 kg)  02/26/11 175 lb (79.379 kg)  01/29/11 186 lb 3.2 oz (84.46 kg)    Body mass index is 27.72 kg/(m^2). Patient meets criteria for overweight based on current BMI.   Current diet order is Heart Healthy, patient is consuming approximately 75% of meals at this time. Good appetite reported. Labs and medications reviewed. No nutrition interventions warranted at this time.   Royann Shivers MS,RD,CSG,LDN Office: [phone] Pager: [phone]

## 2013-03-22 DIAGNOSIS — J189 Pneumonia, unspecified organism: Secondary | ICD-10-CM | POA: Diagnosis not present

## 2013-03-22 LAB — LEGIONELLA ANTIGEN, URINE: Legionella Antigen, Urine: NEGATIVE

## 2013-03-22 MED ORDER — LEVOFLOXACIN 750 MG PO TABS: 750.0000 mg | ORAL_TABLET | ORAL | Status: AC

## 2013-03-22 NOTE — Progress Notes (Signed)
Podiatry Progress Note  Subjective: Jerry Cain is a 79 y.o. male who had foot surgery on 03/16/2013.  He is feeling better.  He is being discharged today.   Past Medical History  Diagnosis Date  . Hypertension   . Hypercholesterolemia   . Hypothyroidism   . Gout   . BPH (benign prostatic hyperplasia)   . CAD (coronary artery disease) 2009    cardiac cath and PTCA by Dr. Jenne Campus  . Tubular adenoma 2001  . Diverticulosis 2007    tcs by Dr. Karilyn Cota  . GERD (gastroesophageal reflux disease)   . Adenomatous colon polyp 2001  . Paroxysmal a-fib 2012    now on coumadin  . Osteoarthritis     left knee   Scheduled Meds: Continuous Infusions: PRN Meds:.acetaminophen, oxyCODONE-acetaminophen, polyvinyl alcohol, sodium chloride  Allergies  Allergen Reactions  . Penicillins Other (See Comments)    Does not know    Past Surgical History  Procedure Laterality Date  . Hernia repair  2009    left inguinal  . Cataract extraction      bilateral  . Cardiac catheterization  06/11/2007    PTCA X2 in 06/2007:  2 overlapping Cypher stents to LAD (2.5x13 and 3.0x23)  . Colonoscopy  2007    Dr. Karilyn Cota- L sided diverticulosis. Next TCS 2012 due to h/o tubular adenoma.  . Esophagogastroduodenoscopy  07/2007    Dr. Elmer Ramp  . Colonoscopy  02/26/2011    Procedure: COLONOSCOPY;  Surgeon: Corbin Ade, MD;  Location: [facility];  Service: Endoscopy;  Laterality: N/A;  11:05  . Tee without cardioversion  12/12/2011    Procedure: TRANSESOPHAGEAL ECHOCARDIOGRAM (TEE);  Surgeon: Thurmon Fair, MD;  Location: [facility] ENDOSCOPY;  Service: Cardiovascular;  Laterality: N/A;   successful/sinus rhythm  . Cardioversion  12/12/2011    Procedure: CARDIOVERSION;  Surgeon: Thurmon Fair, MD;  Location: [facility] ENDOSCOPY;  Service: Cardiovascular;  Laterality: N/A;  . Cardiac catheterization  06/21/2007    Cypher stent 2.5 x 13  to OM branch  . Stents    . Bunionectomy Right 03/17/2013    Procedure:  BUNIONECTOMY, Arthroplasty 2nd toe right foot;  Surgeon: Dallas Schimke, DPM;  Location: [facility] ORS;  Service: Orthopedics;  Laterality: Right;  . Aiken osteotomy Right 03/17/2013    Procedure: Quintella Reichert OSTEOTOMY;  Surgeon: Dallas Schimke, DPM;  Location: [facility] ORS;  Service: Orthopedics;  Laterality: Right;  . Flexor tenotomy Left 03/17/2013    Procedure: PERCUTANEOUS FLEXOR TENOTOMY 3RD TOE LEFT FOOT;  Surgeon: Dallas Schimke, DPM;  Location: [facility] ORS;  Service: Orthopedics;  Laterality: Left;   Family History  Problem Relation Age of Onset  . Colon cancer Neg Hx   . Pneumonia Father   . Heart disease Brother     open heart surgery at age 2   Social History:  reports that he has never smoked. He does not have any smokeless tobacco history on file. He reports that he does not drink alcohol or use illicit drugs.  Review of Systems: Fever, left knee pain.    Physical Examination: Vital signs in last 24 hours:   Temp:  [98.3 F (36.8 C)-100.1 F (37.8 C)] 98.3 F (36.8 C) (12/16 0517) Pulse Rate:  [68-81] 68 (12/16 0517) Resp:  [19-20] 19 (12/16 0517) BP: (126-150)/(58-71) 133/71 mmHg (12/16 0517) SpO2:  [95 %-98 %] 97 % (12/16 0517)  R foot:  A cam walker is in place.  Dressing is clean, dry and intact.  Incisions are well  approximated with sutures in place.  There are no acute signs of infection present.  There is edema of the right foot.  This is consistent with the postoperative course.  L foot:  A post-operative shoe is in place.  Dressing is clean, dry and intact.  Incision is well approximated with sutures in place.  There no acute signs of infection present.  Lab/Test Results:    Recent Labs  03/20/13 1923 03/21/13 0200  WBC 7.0 6.8  HGB 11.0* 11.5*  HCT 31.8* 33.5*  PLT 132* 150  NA 132* 135  K 3.9 3.7  CL 97 101  CO2 23 24  BUN 18 16  CREATININE 1.04 1.00  GLUCOSE 131* 97  CALCIUM 8.2* 8.6    Recent Results (from the past 240 hour(s))   CULTURE, BLOOD (ROUTINE X 2)     Status: None   Collection Time    03/21/13  1:51 AM      Result Value Range Status   Specimen Description BLOOD RIGHT ANTECUBITAL   Final   Special Requests     Final   Value: BOTTLES DRAWN AEROBIC AND ANAEROBIC AEB=10CC ANA=8CC    Culture NO GROWTH 1 DAY   Final   Report Status PENDING   Incomplete  CULTURE, BLOOD (ROUTINE X 2)     Status: None   Collection Time    03/21/13  2:00 AM      Result Value Range Status   Specimen Description BLOOD RIGHT HAND   Final   Special Requests     Final   Value: BOTTLES DRAWN AEROBIC AND ANAEROBIC AEB=10CC ANA=3CC   Culture NO GROWTH 1 DAY   Final   Report Status PENDING   Incomplete     Dg Chest 2 View  03/20/2013   CLINICAL DATA:  Fever.  Altered mental status.  EXAM: CHEST  2 VIEW  COMPARISON:  09/10/2011  FINDINGS: The heart size and mediastinal contours are within normal limits. Both lungs are clear. The visualized skeletal structures are unremarkable.  IMPRESSION: No active cardiopulmonary disease.   Electronically Signed   By: Myles Rosenthal M.D.   On: 03/20/2013 20:54   Ct Angio Chest Pe W/cm &/or Wo Cm  03/20/2013   CLINICAL DATA:  Pain and swelling in left knee with fever, recent bunionectomy. History of Coronary artery disease, cardiac catheterization and atrial fibrillation.  EXAM: CT ANGIOGRAPHY CHEST WITH CONTRAST  TECHNIQUE: Multidetector CT imaging of the chest was performed using the standard protocol during bolus administration of intravenous contrast. Multiplanar CT image reconstructions including MIPs were obtained to evaluate the vascular anatomy.  CONTRAST:  OMNIPAQUE IOHEXOL 350 MG/ML SOLN  COMPARISON:  Chest radiograph March 20, 2013 at 2021 hr  FINDINGS: Main pulmonary artery is not enlarged. No pulmonary arterial filling defects to the level of the subsegmental branches.  Small bilateral pleural effusions, with patchy mild left greater than right lung base consolidation, minimal  ground-glass opacity in the right middle lobe, lingula with mild bronchial wall thickening. Right middle lobe 4 mm anterior segment granuloma. 6 mm right lower lobe smoothly marginated pleural-based low-density nodule.  Tracheobronchial tree is patent, no pneumothorax. Thoracic aorta is normal in course and caliber with mild calcific atherosclerosis. Heart appears mildly enlarged, pericardium is unremarkable. Apparent Coronary artery stents.  Small axillary lymph nodes are likely reactive. Thoracic esophagus is nonsuspicious.Small hiatal hernia. Remote approximate T12 moderate compression fracture. Mild degenerative change of the thoracic spine.  Review of the MIP images confirms the above  findings.  IMPRESSION: No pulmonary embolism.  Mild bibasilar consolidation may reflect atelectasis or even pneumonia, with small bilateral pleural effusions, and patchy ground-glass opacities. Minimal bronchial wall thickening could reflect bronchitis.  6 mm right lung base pleural based nodule is likely benign.   Electronically Signed   By: Awilda Metro   On: 03/20/2013 23:28   Dg Knee Complete 4 Views Left  03/20/2013   CLINICAL DATA:  Fever.  Knee pain.  EXAM: LEFT KNEE - COMPLETE 4+ VIEW  COMPARISON:  None.  FINDINGS: Severe tricompartmental osteoarthritis of the left knee is present. There is no fracture or acute osseous abnormality. Large loose body is present in the anterior knee joint on the lateral view. Mild lateral subluxation of the tibia. Depression of the medial femoral condyles favored to represent chronic osteoarthritic remodeling or old tibial plateau fracture.  IMPRESSION: Large effusion and severe tricompartmental osteoarthritis.   Electronically Signed   By: Andreas Newport M.D.   On: 03/20/2013 20:53   Dg Foot Complete Left  03/20/2013   CLINICAL DATA:  Fever, prior hammertoe surgery  EXAM: LEFT FOOT - COMPLETE 3+ VIEW  COMPARISON:  03/17/2013  FINDINGS: Osseous mineralization normal.  Joint  spaces preserved.  Question soft tissue swelling at 3rd toe.  No acute fracture, dislocation or bone destruction.  IMPRESSION: No acute osseous abnormalities.   Electronically Signed   By: Ulyses Southward M.D.   On: 03/20/2013 20:42   Dg Foot Complete Right  03/20/2013   CLINICAL DATA:  Fever, recent foot surgery  EXAM: RIGHT FOOT COMPLETE - 3+ VIEW  COMPARISON:  03/17/2013  FINDINGS: Prior osteotomy at the base of the proximal phalanx great toe with staple identified.  Prior resection of the head of the proximal phalanx of the 2nd toe.  Osseous mineralization normal.  Joint spaces preserved.  No acute fracture, dislocation, or bone destruction.  Mild soft tissue swelling at forefoot.  IMPRESSION: Postsurgical changes as above.  Soft tissue swelling.  No acute bony abnormalities.   Electronically Signed   By: Ulyses Southward M.D.   On: 03/20/2013 20:44    Assessment: Fever on post-operative day 5.  Plan: I personally changed his surgical dressings.  I reapplied the Cam Walker to the right lower extremity.  I reapplied the postoperative shoe to the left lower extremity.  He is to continue to wear the Cam Walker and postoperative shoe.  He is scheduled to follow-up in my [place] office on 03/29/2013 at 11:00 AM.     Eloyce Bultman IVAN 03/22/2013, 12:53 PM

## 2013-03-22 NOTE — Progress Notes (Signed)
Pt discharged home today per Dr. Sherrie Mustache. Pt's IV site D/C'd and WNL. Pt's VS stable at this time. Pt provided with home medication list, discharge instructions and prescriptions. Verbalized understanding. Pt left floor via WC in stable condition accompanied by NT.

## 2013-03-22 NOTE — Discharge Summary (Signed)
Physician Discharge Summary  Jerry Cain ZOX:[id number] DOB: 09/04/1931 DOA: 03/20/2013  PCP: Samuel Jester, DO  Admit date: 03/20/2013 Discharge date: 03/22/2013  Time spent: 40 minutes  Recommendations for Outpatient Follow-up:  1. PCP 1 week for follow up community acquired pneumonia 2. [facility] 03/25/13 for INR check and coumadin dosing 3. Dr. Nolen Mu podiatry: follow up as scheduled for dressing changes Discharge Diagnoses:  Principal Problem:   PNA (pneumonia) Active Problems:   Atrial fibrillation   Long term (current) use of anticoagulants   HTN (hypertension)   Fever   Altered mental state   Postoperative fever   BPH (benign prostatic hyperplasia)   GERD (gastroesophageal reflux disease)   CAD (coronary artery disease)   Osteoarthritis of left knee   Discharge Condition: stable  Diet recommendation: heart healthy  Filed Weights   03/21/13 0001  Weight: 92.715 kg (204 lb 6.4 oz)    History of present illness:  79 yo male recent podiatry surgery 12/10 comes to ED on 03/20/13 with 24 huors of chills and fever. No n/v/d. Family reports some confusion. No cp. No cough. No sob. No le edema or swelling. No rashes. No abd pain. No sick contacts. Lab work in ED unremarkable, VS significant for temp 101.3 orally. Chest CT negative for PE but concerning for CAP, urinalysis unremarkable. Pt admitted.   Hospital Course:  Principal Problem:   CAP. CT angio chest yields mild bibasilar consolidation may reflect atelectasis or even pneumonia, with small bilateral pleural effusions, and patchy ground-glass opacities. Minimal bronchial wall thickening could reflect bronchitis. 6 mm right lung base pleural based nodule is likely benign Admitted to floor. Provided with IV fluids and levaquin. Max temp 101.3. Remained hemodynamically stable. Legionella antigen in process at discharge. Strep pneumoniae negative and blood cultures with no growth. Flu by  pcr negative. No leukocytosis and non-toxic appearing during this hospitalization. He quickly improved. Will discharge with 6 more days of levaquin to complete 7 days.   Active Problems:  Altered mental state: likely related to above with fever and post op pain medication. Quickly resolved and at discharge was at baseline.   Postoperative fever: Likely related to #1 as surgical sites evaluated in ED and not thought to be source. Max temp 101.3 on admission. At discharge temp 98.3 orally. Blood cultures negative. Remained hemodynamically stable.   Atrial fibrillation: rate controlled during this hospitalizaiton. INR 1.57 at discharge and sub-therapeutic on admission. Continue coumadin per pharmacy during hospitalization. Will be discharged with home coumadin regimen. Pt has appointment for INR check and coumadin dosing 03/25/13. Continue BB   Long term (current) use of anticoagulants: secondary to afib. See above   HTN (hypertension) Fair control during this hospitalization. Continue lisinopril and BB.  CAD: recently evaluated by cardiology in preparation for surgery. Cards note indicate echo 1 year ago with preserved left ventricular systolic function and no valvular abnormalities as well as a normal stress nuclear myocardial scan 9/13. Stable during this hospitalization.   Osteo arthritis: xray of left knee yields large effusion and severe tricompartmental osteoarthritis. Hoping for knee replacement once feet heal. Family refused arthrocentesis in ED. Will follow up with orthopedics OP.   Gerd: remained stable at baseline.      Procedures:  none  Consultations:  none  Discharge Exam: Filed Vitals:   03/22/13 0517  BP: 133/71  Pulse: 68  Temp: 98.3 F (36.8 C)  Resp: 19    General: awake alert comfortable Cardiovascular: RRR No m/g/r. Bilateral  LE dressing to feet dry and intact. Toes warm to touch Respiratory: normal effort. BS are clear bilaterally. Slightly distant in  bases no wheeze or rhonchi  Discharge Instructions   Future Appointments Provider Department Dept Phone   04/06/2013 10:10 AM Phillips Hay, RPH-CPP [facility] [phone]       Medication List         aspirin 81 MG tablet  Take 81 mg by mouth every morning.     atorvastatin 40 MG tablet  Commonly known as:  LIPITOR  Take 1 tablet (40 mg total) by mouth daily.     BYSTOLIC 5 MG tablet  Generic drug:  nebivolol  Take 2.5 mg by mouth at bedtime. Takes 1/2 tablet     Calcium Carbonate-Vitamin D 600-200 MG-UNIT Tabs  Take 1 tablet by mouth 2 (two) times daily.     finasteride 5 MG tablet  Commonly known as:  PROSCAR  Take 5 mg by mouth every morning.     GLUCOSAMINE PO  Take 1 tablet by mouth 2 (two) times daily.     isosorbide mononitrate 30 MG 24 hr tablet  Commonly known as:  IMDUR  Take 30 mg by mouth every morning.     ketoconazole 2 % cream  Commonly known as:  NIZORAL  Apply 1 application topically 2 (two) times daily.     levofloxacin 750 MG tablet  Commonly known as:  LEVAQUIN  Take 1 tablet (750 mg total) by mouth daily.     levothyroxine 88 MCG tablet  Commonly known as:  SYNTHROID, LEVOTHROID  Take 88 mcg by mouth every morning.     lisinopril 20 MG tablet  Commonly known as:  PRINIVIL,ZESTRIL  Take 20 mg by mouth at bedtime.     multivitamin capsule  Take 1 capsule by mouth every morning.     nitroGLYCERIN 0.4 MG SL tablet  Commonly known as:  NITROSTAT  Place 1 tablet (0.4 mg total) under the tongue every 5 (five) minutes as needed.     oxyCODONE-acetaminophen 10-325 MG per tablet  Commonly known as:  PERCOCET  Take 0.5 tablets by mouth every 6 (six) hours as needed. For pain     psyllium 58.6 % powder  Commonly known as:  METAMUCIL  Take 1 packet by mouth daily. (3 teaspoonsful once daily at bedtime)     tamsulosin 0.4 MG Caps capsule  Commonly known as:  FLOMAX  Take 0.4 mg by mouth at bedtime.     warfarin 5 MG  tablet  Commonly known as:  COUMADIN  Take 2.5-5 mg by mouth daily. Takes one-half tablet on FRIDAYS ONLY       Allergies  Allergen Reactions  . Penicillins Other (See Comments)    Does not know        Follow-up Information   Follow up with BUTLER, CYNTHIA, DO. Schedule an appointment as soon as possible for a visit in 1 week. (follow up CAP)    Contact information:   [address] [place] [address] [phone]        The results of significant diagnostics from this hospitalization (including imaging, microbiology, ancillary and laboratory) are listed below for reference.    Significant Diagnostic Studies: Dg Chest 2 View  03/20/2013   CLINICAL DATA:  Fever.  Altered mental status.  EXAM: CHEST  2 VIEW  COMPARISON:  09/10/2011  FINDINGS: The heart size and mediastinal contours are within normal limits. Both lungs are clear. The visualized skeletal  structures are unremarkable.  IMPRESSION: No active cardiopulmonary disease.   Electronically Signed   By: Myles Rosenthal M.D.   On: 03/20/2013 20:54   Ct Angio Chest Pe W/cm &/or Wo Cm  03/20/2013   CLINICAL DATA:  Pain and swelling in left knee with fever, recent bunionectomy. History of Coronary artery disease, cardiac catheterization and atrial fibrillation.  EXAM: CT ANGIOGRAPHY CHEST WITH CONTRAST  TECHNIQUE: Multidetector CT imaging of the chest was performed using the standard protocol during bolus administration of intravenous contrast. Multiplanar CT image reconstructions including MIPs were obtained to evaluate the vascular anatomy.  CONTRAST:  OMNIPAQUE IOHEXOL 350 MG/ML SOLN  COMPARISON:  Chest radiograph March 20, 2013 at 2021 hr  FINDINGS: Main pulmonary artery is not enlarged. No pulmonary arterial filling defects to the level of the subsegmental branches.  Small bilateral pleural effusions, with patchy mild left greater than right lung base consolidation, minimal ground-glass opacity in the right middle  lobe, lingula with mild bronchial wall thickening. Right middle lobe 4 mm anterior segment granuloma. 6 mm right lower lobe smoothly marginated pleural-based low-density nodule.  Tracheobronchial tree is patent, no pneumothorax. Thoracic aorta is normal in course and caliber with mild calcific atherosclerosis. Heart appears mildly enlarged, pericardium is unremarkable. Apparent Coronary artery stents.  Small axillary lymph nodes are likely reactive. Thoracic esophagus is nonsuspicious.Small hiatal hernia. Remote approximate T12 moderate compression fracture. Mild degenerative change of the thoracic spine.  Review of the MIP images confirms the above findings.  IMPRESSION: No pulmonary embolism.  Mild bibasilar consolidation may reflect atelectasis or even pneumonia, with small bilateral pleural effusions, and patchy ground-glass opacities. Minimal bronchial wall thickening could reflect bronchitis.  6 mm right lung base pleural based nodule is likely benign.   Electronically Signed   By: Awilda Metro   On: 03/20/2013 23:28   Dg Knee Complete 4 Views Left  03/20/2013   CLINICAL DATA:  Fever.  Knee pain.  EXAM: LEFT KNEE - COMPLETE 4+ VIEW  COMPARISON:  None.  FINDINGS: Severe tricompartmental osteoarthritis of the left knee is present. There is no fracture or acute osseous abnormality. Large loose body is present in the anterior knee joint on the lateral view. Mild lateral subluxation of the tibia. Depression of the medial femoral condyles favored to represent chronic osteoarthritic remodeling or old tibial plateau fracture.  IMPRESSION: Large effusion and severe tricompartmental osteoarthritis.   Electronically Signed   By: Andreas Newport M.D.   On: 03/20/2013 20:53   Dg Foot Complete Left  03/20/2013   CLINICAL DATA:  Fever, prior hammertoe surgery  EXAM: LEFT FOOT - COMPLETE 3+ VIEW  COMPARISON:  03/17/2013  FINDINGS: Osseous mineralization normal.  Joint spaces preserved.  Question soft tissue  swelling at 3rd toe.  No acute fracture, dislocation or bone destruction.  IMPRESSION: No acute osseous abnormalities.   Electronically Signed   By: Ulyses Southward M.D.   On: 03/20/2013 20:42   Dg Foot Complete Left  03/17/2013   CLINICAL DATA:  This is a postoperative evaluation.  EXAM: LEFT FOOT - COMPLETE 3+ VIEW  COMPARISON:  None.  FINDINGS: There is no radiographic evidence of acute fracture or dislocation.  IMPRESSION: Postoperative left foot evaluation.   Electronically Signed   By: Salome Holmes M.D.   On: 03/17/2013 13:29   Dg Foot Complete Right  03/20/2013   CLINICAL DATA:  Fever, recent foot surgery  EXAM: RIGHT FOOT COMPLETE - 3+ VIEW  COMPARISON:  03/17/2013  FINDINGS: Prior osteotomy  at the base of the proximal phalanx great toe with staple identified.  Prior resection of the head of the proximal phalanx of the 2nd toe.  Osseous mineralization normal.  Joint spaces preserved.  No acute fracture, dislocation, or bone destruction.  Mild soft tissue swelling at forefoot.  IMPRESSION: Postsurgical changes as above.  Soft tissue swelling.  No acute bony abnormalities.   Electronically Signed   By: Ulyses Southward M.D.   On: 03/20/2013 20:44   Dg Foot Complete Right  03/17/2013   CLINICAL DATA:  Postoperative change right foot.  EXAM: RIGHT FOOT COMPLETE - 3+ VIEW  COMPARISON:  Plain films 03/14/2013.  FINDINGS: The patient has undergone resection arthroplasty of the head of the proximal phalanx of the 2nd toe. There has also been arthroplasty at the base of the proximal phalanx of the great toe with a fixation bone stable in place. No evidence of post seizure complication is identified. No acute abnormality is seen.  IMPRESSION: Postoperative change as described.  No acute finding.   Electronically Signed   By: Drusilla Kanner M.D.   On: 03/17/2013 14:12   Dg Foot Complete Right  03/14/2013   CLINICAL DATA:  Hammertoe.  Hallux valgus.  EXAM: RIGHT FOOT COMPLETE - 3+ VIEW  COMPARISON:  None.   FINDINGS: No fracture or dislocation. There is mild joint space narrowing with small marginal osteophytes at the 1st metatarsophalangeal joint. There is no significant hallux valgus. Prominence of the medial 1st metatarsal head is consistent with a small bunion.  Interphalangeal joint spaces are relatively well preserved. No joint erosions.  Minor joint space narrowing and small marginal osteophytes involving the intertarsal articulations. No convincing erosions.  Small dorsal and plantar calcaneal spurs.  There is a relative flat foot deformity on the lateral standing view.  The soft tissues are unremarkable.  IMPRESSION: Degenerative changes as described most evident at the 1st metatarsophalangeal joint. No fracture or acute finding.   Electronically Signed   By: Amie Portland M.D.   On: 03/14/2013 11:12    Microbiology: Recent Results (from the past 240 hour(s))  CULTURE, BLOOD (ROUTINE X 2)     Status: None   Collection Time    03/21/13  1:51 AM      Result Value Range Status   Specimen Description BLOOD RIGHT ANTECUBITAL   Final   Special Requests     Final   Value: BOTTLES DRAWN AEROBIC AND ANAEROBIC AEB=10CC ANA=8CC    Culture NO GROWTH 1 DAY   Final   Report Status PENDING   Incomplete  CULTURE, BLOOD (ROUTINE X 2)     Status: None   Collection Time    03/21/13  2:00 AM      Result Value Range Status   Specimen Description BLOOD RIGHT HAND   Final   Special Requests     Final   Value: BOTTLES DRAWN AEROBIC AND ANAEROBIC AEB=10CC ANA=3CC   Culture NO GROWTH 1 DAY   Final   Report Status PENDING   Incomplete     Labs: Basic Metabolic Panel:  Recent Labs Lab 03/20/13 1923 03/21/13 0200  NA 132* 135  K 3.9 3.7  CL 97 101  CO2 23 24  GLUCOSE 131* 97  BUN 18 16  CREATININE 1.04 1.00  CALCIUM 8.2* 8.6   Liver Function Tests:  Recent Labs Lab 03/20/13 1924  AST 26  ALT 23  ALKPHOS 50  BILITOT 1.5*  PROT 6.3  ALBUMIN 3.0*   No results  found for this basename:  LIPASE, AMYLASE,  in the last 168 hours No results found for this basename: AMMONIA,  in the last 168 hours CBC:  Recent Labs Lab 03/20/13 1923 03/21/13 0200  WBC 7.0 6.8  NEUTROABS 5.1 4.3  HGB 11.0* 11.5*  HCT 31.8* 33.5*  MCV 91.9 92.0  PLT 132* 150   Cardiac Enzymes: No results found for this basename: CKTOTAL, CKMB, CKMBINDEX, TROPONINI,  in the last 168 hours BNP: BNP (last 3 results) No results found for this basename: PROBNP,  in the last 8760 hours CBG: No results found for this basename: GLUCAP,  in the last 168 hours     Signed:  Gwenyth Bender  [facility] 03/22/2013, 11:04 AM

## 2013-03-22 NOTE — Discharge Summary (Signed)
Patient seen, independently examined and chart reviewed. I agree with exam, assessment and plan discussed with Toya Smothers, NP.  Feeling much better. Breathing better. No new issues.  Afebrile vital signs stable. Appears calm and comfortable. Lungs are clear to auscultation.  Patient stable for discharge, complete treatment with antibiotics, followup PT/INR as directed.  I discussed that Coumadin can comfortably elevate INR as rationale for close followup.  Brendia Sacks, MD [facility] [phone]

## 2013-03-22 NOTE — Discharge Instructions (Signed)
Take medications as directed Appointment with [facility] 03/25/13 for INR check and coumadin dosing Follow up with Dr. Nolen Mu as directed

## 2013-03-25 ENCOUNTER — Ambulatory Visit (INDEPENDENT_AMBULATORY_CARE_PROVIDER_SITE_OTHER): Payer: Medicare Other | Admitting: Pharmacist Clinician (PhC)/ Clinical Pharmacy Specialist

## 2013-03-25 VITALS — BP 118/60 | HR 72

## 2013-03-25 DIAGNOSIS — I4891 Unspecified atrial fibrillation: Secondary | ICD-10-CM

## 2013-03-25 DIAGNOSIS — Z7901 Long term (current) use of anticoagulants: Secondary | ICD-10-CM

## 2013-03-25 LAB — POCT INR: INR: 1.8

## 2013-03-28 LAB — CULTURE, BLOOD (ROUTINE X 2)
Culture: NO GROWTH
Culture: NO GROWTH

## 2013-04-06 ENCOUNTER — Ambulatory Visit: Payer: Medicare Other | Admitting: Pharmacist Clinician (PhC)/ Clinical Pharmacy Specialist

## 2013-04-06 DIAGNOSIS — E785 Hyperlipidemia, unspecified: Secondary | ICD-10-CM | POA: Diagnosis not present

## 2013-04-06 DIAGNOSIS — I10 Essential (primary) hypertension: Secondary | ICD-10-CM | POA: Diagnosis not present

## 2013-04-06 DIAGNOSIS — M999 Biomechanical lesion, unspecified: Secondary | ICD-10-CM | POA: Diagnosis not present

## 2013-04-06 DIAGNOSIS — M67919 Unspecified disorder of synovium and tendon, unspecified shoulder: Secondary | ICD-10-CM | POA: Diagnosis not present

## 2013-04-06 DIAGNOSIS — I4891 Unspecified atrial fibrillation: Secondary | ICD-10-CM | POA: Diagnosis not present

## 2013-04-06 DIAGNOSIS — G5600 Carpal tunnel syndrome, unspecified upper limb: Secondary | ICD-10-CM | POA: Diagnosis not present

## 2013-04-08 ENCOUNTER — Telehealth: Payer: Self-pay | Admitting: *Deleted

## 2013-04-08 MED ORDER — WARFARIN SODIUM 5 MG PO TABS: ORAL_TABLET | ORAL | Status: DC

## 2013-04-08 NOTE — Telephone Encounter (Signed)
Pt needs to a Rx refill for Warfarin he called [facility] and they stated that they tried to call us and fax over the refill 5 times but no response. He would like to get a call back from you.

## 2013-04-11 DIAGNOSIS — M19049 Primary osteoarthritis, unspecified hand: Secondary | ICD-10-CM | POA: Diagnosis not present

## 2013-04-15 ENCOUNTER — Ambulatory Visit (INDEPENDENT_AMBULATORY_CARE_PROVIDER_SITE_OTHER): Payer: Medicare Other | Admitting: Pharmacist Clinician (PhC)/ Clinical Pharmacy Specialist

## 2013-04-15 VITALS — BP 162/78 | HR 76

## 2013-04-15 DIAGNOSIS — L97509 Non-pressure chronic ulcer of other part of unspecified foot with unspecified severity: Secondary | ICD-10-CM | POA: Diagnosis not present

## 2013-04-15 DIAGNOSIS — Z7901 Long term (current) use of anticoagulants: Secondary | ICD-10-CM

## 2013-04-15 DIAGNOSIS — I4891 Unspecified atrial fibrillation: Secondary | ICD-10-CM | POA: Diagnosis not present

## 2013-04-15 LAB — POCT INR: INR: 3.8

## 2013-04-18 DIAGNOSIS — M6740 Ganglion, unspecified site: Secondary | ICD-10-CM | POA: Diagnosis not present

## 2013-04-18 DIAGNOSIS — M19049 Primary osteoarthritis, unspecified hand: Secondary | ICD-10-CM | POA: Diagnosis not present

## 2013-04-18 DIAGNOSIS — G5600 Carpal tunnel syndrome, unspecified upper limb: Secondary | ICD-10-CM | POA: Diagnosis not present

## 2013-04-29 ENCOUNTER — Ambulatory Visit (INDEPENDENT_AMBULATORY_CARE_PROVIDER_SITE_OTHER): Payer: Medicare Other | Admitting: Urology

## 2013-04-29 DIAGNOSIS — N401 Enlarged prostate with lower urinary tract symptoms: Secondary | ICD-10-CM

## 2013-04-29 DIAGNOSIS — N138 Other obstructive and reflux uropathy: Secondary | ICD-10-CM | POA: Diagnosis not present

## 2013-04-29 DIAGNOSIS — R339 Retention of urine, unspecified: Secondary | ICD-10-CM

## 2013-05-03 DIAGNOSIS — L97509 Non-pressure chronic ulcer of other part of unspecified foot with unspecified severity: Secondary | ICD-10-CM | POA: Diagnosis not present

## 2013-05-03 DIAGNOSIS — M2040 Other hammer toe(s) (acquired), unspecified foot: Secondary | ICD-10-CM | POA: Diagnosis not present

## 2013-05-03 DIAGNOSIS — M2010 Hallux valgus (acquired), unspecified foot: Secondary | ICD-10-CM | POA: Diagnosis not present

## 2013-05-05 DIAGNOSIS — I10 Essential (primary) hypertension: Secondary | ICD-10-CM | POA: Diagnosis not present

## 2013-05-05 DIAGNOSIS — M67919 Unspecified disorder of synovium and tendon, unspecified shoulder: Secondary | ICD-10-CM | POA: Diagnosis not present

## 2013-05-05 DIAGNOSIS — E785 Hyperlipidemia, unspecified: Secondary | ICD-10-CM | POA: Diagnosis not present

## 2013-05-05 DIAGNOSIS — I4891 Unspecified atrial fibrillation: Secondary | ICD-10-CM | POA: Diagnosis not present

## 2013-05-06 ENCOUNTER — Ambulatory Visit (INDEPENDENT_AMBULATORY_CARE_PROVIDER_SITE_OTHER): Payer: Medicare Other | Admitting: Pharmacist Clinician (PhC)/ Clinical Pharmacy Specialist

## 2013-05-06 VITALS — BP 160/60 | HR 64

## 2013-05-06 DIAGNOSIS — I4891 Unspecified atrial fibrillation: Secondary | ICD-10-CM

## 2013-05-06 DIAGNOSIS — Z7901 Long term (current) use of anticoagulants: Secondary | ICD-10-CM

## 2013-05-06 LAB — POCT INR: INR: 3.2

## 2013-05-13 ENCOUNTER — Encounter: Payer: Self-pay | Admitting: Cardiovascular Disease

## 2013-05-13 ENCOUNTER — Ambulatory Visit (INDEPENDENT_AMBULATORY_CARE_PROVIDER_SITE_OTHER): Payer: Medicare Other | Admitting: Cardiovascular Disease

## 2013-05-13 VITALS — BP 138/92 | HR 70 | Resp 16 | Ht 70.0 in | Wt 200.2 lb

## 2013-05-13 DIAGNOSIS — I4891 Unspecified atrial fibrillation: Secondary | ICD-10-CM

## 2013-05-13 DIAGNOSIS — E785 Hyperlipidemia, unspecified: Secondary | ICD-10-CM

## 2013-05-13 DIAGNOSIS — I10 Essential (primary) hypertension: Secondary | ICD-10-CM | POA: Diagnosis not present

## 2013-05-13 DIAGNOSIS — I2510 Atherosclerotic heart disease of native coronary artery without angina pectoris: Secondary | ICD-10-CM | POA: Diagnosis not present

## 2013-05-13 MED ORDER — CARVEDILOL 6.25 MG PO TABS: 6.2500 mg | ORAL_TABLET | Freq: Two times a day (BID) | ORAL | Status: DC

## 2013-05-13 NOTE — Patient Instructions (Signed)
Increase Carvedilol to 6.25mg  twice a day.  A new prescription has been sent to your pharmacy.   Your physician recommends that you schedule a follow-up appointment in: 6 months.

## 2013-05-18 DIAGNOSIS — M1710 Unilateral primary osteoarthritis, unspecified knee: Secondary | ICD-10-CM | POA: Diagnosis not present

## 2013-05-22 ENCOUNTER — Encounter: Payer: Self-pay | Admitting: Cardiovascular Disease

## 2013-05-22 NOTE — Assessment & Plan Note (Signed)
Excellent LDL. Low HDL - no good pharmacological solution for this.

## 2013-05-22 NOTE — Assessment & Plan Note (Signed)
Asymptomatic. Low risk nuclear perfusion study within last 18 months. Low risk for major cardiovascular complications with planned knee surgery.

## 2013-05-22 NOTE — Progress Notes (Signed)
[name], male   DOB: 1931/07/22, 80 y.o.   MRN: [id number]      Reason for office visit atrial fibrillation, CAD  Jerry Cain is a remarkably fit and active 80 year old gentleman who has fairly infrequent and only mildly symptomatic episodes of paroxysmal atrial fibrillation. Recently, palpitations are more noticeable and this correlates with transition from Bystolic to carvedilol (for cost issues). He has been unable to swim due to a foot wound - was non weightbearing for 7 weeks and is still not allowed in the pool. Before that he was very physically active. He may need left knee surgery (Jerry Cain).  He denies shortness of breath, chest pain, palpitations, syncope or dizziness, focal neurological deficits or any bleeding complications. He is on warfarin anticoagulation. He has never expressed a stroke or TIA.   In 2009, he received drug eluting Cypher stents to the LAD and the circumflex coronary arteries. Comorbidity includes systemic hypertension and hyperlipidemia both of which are adequately treated. By echocardiography in 2013 he has preserved left ventricular systolic function and no valvular abnormalities. He had a normal stress nuclear myocardial scan in September of 2013.    Allergies  Allergen Reactions  . Penicillins Other (See Comments)    Does not know     Current Outpatient Prescriptions  Medication Sig Dispense Refill  . aspirin 81 MG tablet Take 81 mg by mouth every morning.       Marland Kitchen atorvastatin (LIPITOR) 40 MG tablet Take 1 tablet (40 mg total) by mouth daily.  30 tablet  5  . Calcium Carbonate-Vitamin D (CALCIUM + D) 600-200 MG-UNIT TABS Take 1 tablet by mouth 2 (two) times daily.       . carvedilol (COREG) 6.25 MG tablet Take 1 tablet (6.25 mg total) by mouth 2 (two) times daily.  60 tablet  11  . finasteride (PROSCAR) 5 MG tablet Take 5 mg by mouth every morning.       . Glucosamine HCl (GLUCOSAMINE PO) Take 1 tablet by mouth 2 (two)  times daily.      . isosorbide mononitrate (IMDUR) 30 MG 24 hr tablet Take 30 mg by mouth every morning.      Marland Kitchen levothyroxine (SYNTHROID, LEVOTHROID) 88 MCG tablet Take 88 mcg by mouth every morning.      Marland Kitchen lisinopril (PRINIVIL,ZESTRIL) 20 MG tablet Take 20 mg by mouth at bedtime.      . Multiple Vitamin (MULTIVITAMIN) capsule Take 1 capsule by mouth every morning.       . nitroGLYCERIN (NITROSTAT) 0.4 MG SL tablet Place 1 tablet (0.4 mg total) under the tongue every 5 (five) minutes as needed.  25 tablet  2  . psyllium (METAMUCIL) 58.6 % powder Take 1 packet by mouth daily. (3 teaspoonsful once daily at bedtime)      . tamsulosin (FLOMAX) 0.4 MG CAPS capsule Take 0.4 mg by mouth at bedtime.       Marland Kitchen warfarin (COUMADIN) 5 MG tablet Take 1 tablet by mouth daily or as directed  30 tablet  5   No current facility-administered medications for this visit.    Past Medical History  Diagnosis Date  . Hypertension   . Hypercholesterolemia   . Hypothyroidism   . Gout   . BPH (benign prostatic hyperplasia)   . CAD (coronary artery disease) 2009    cardiac cath and PTCA by Jerry Cain  . Tubular adenoma 2001  . Diverticulosis 2007    tcs by Jerry Cain  .  GERD (gastroesophageal reflux disease)   . Adenomatous colon polyp 2001  . Paroxysmal a-fib 2012    now on coumadin  . Osteoarthritis     left knee    Past Surgical History  Procedure Laterality Date  . Hernia repair  2009    left inguinal  . Cataract extraction      bilateral  . Cardiac catheterization  06/11/2007    PTCA X2 in 06/2007:  2 overlapping Cypher stents to LAD (2.5x13 and 3.0x23)  . Colonoscopy  2007    Jerry Cain- L sided diverticulosis. Next TCS 2012 due to h/o tubular adenoma.  . Esophagogastroduodenoscopy  07/2007    Jerry Cain  . Colonoscopy  02/26/2011    Procedure: COLONOSCOPY;  Surgeon: Jerry Ade, Jerry Cain;  Location: [facility];  Service: Endoscopy;  Laterality: N/A;  11:05  . Tee without cardioversion   12/12/2011    Procedure: TRANSESOPHAGEAL ECHOCARDIOGRAM (TEE);  Surgeon: Jerry Fair, Jerry Cain;  Location: [facility] ENDOSCOPY;  Service: Cardiovascular;  Laterality: N/A;   successful/sinus rhythm  . Cardioversion  12/12/2011    Procedure: CARDIOVERSION;  Surgeon: Jerry Fair, Jerry Cain;  Location: [facility] ENDOSCOPY;  Service: Cardiovascular;  Laterality: N/A;  . Cardiac catheterization  06/21/2007    Cypher stent 2.5 x 13  to OM branch  . Stents    . Bunionectomy Right 03/17/2013    Procedure: BUNIONECTOMY, Arthroplasty 2nd toe right foot;  Surgeon: Jerry Cain, Jerry Cain;  Location: [facility] ORS;  Service: Orthopedics;  Laterality: Right;  . Aiken osteotomy Right 03/17/2013    Procedure: Jerry Cain OSTEOTOMY;  Surgeon: Jerry Cain, Jerry Cain;  Location: [facility] ORS;  Service: Orthopedics;  Laterality: Right;  . Flexor tenotomy Left 03/17/2013    Procedure: PERCUTANEOUS FLEXOR TENOTOMY 3RD TOE LEFT FOOT;  Surgeon: Jerry Cain, Jerry Cain;  Location: [facility] ORS;  Service: Orthopedics;  Laterality: Left;    Family History  Problem Relation Age of Onset  . Colon cancer Neg Hx   . Pneumonia Father   . Heart disease Brother     open heart surgery at age 56    History   Social History  . Marital Status: Married    Spouse Name: N/A    Number of Children: 4  . Years of Education: N/A   Occupational History  . retired    Social History Main Topics  . Smoking status: Never Smoker   . Smokeless tobacco: Not on file  . Alcohol Use: No  . Drug Use: No  . Sexual Activity: Not on file   Other Topics Concern  . Not on file   Social History Narrative  . No narrative on file    Review of systems: The patient specifically denies any chest pain at rest or with exertion, dyspnea at rest or with exertion, orthopnea, paroxysmal nocturnal dyspnea, syncope,  focal neurological deficits, intermittent claudication, lower extremity edema, unexplained weight gain, cough, hemoptysis or wheezing.  The patient also denies  abdominal pain, nausea, vomiting, dysphagia, diarrhea, constipation, polyuria, polydipsia, dysuria, hematuria, frequency, urgency, abnormal bleeding or bruising, fever, chills, unexpected weight changes, mood swings, change in skin or hair texture, change in voice quality, auditory or visual problems, allergic reactions or rashes, new musculoskeletal complaints other than usual "aches and pains".   PHYSICAL EXAM BP 138/92  Pulse 70  Resp 16  Ht 5\' 10"  (1.778 m)  Wt 90.81 kg (200 lb 3.2 oz)  BMI 28.73 kg/m2  General: Alert, oriented x3, no distress Head: no evidence of trauma, PERRL,  EOMI, no exophtalmos or lid lag, no myxedema, no xanthelasma; normal ears, nose and oropharynx Neck: normal jugular venous pulsations and no hepatojugular reflux; brisk carotid pulses without delay and no carotid bruits Chest: clear to auscultation, no signs of consolidation by percussion or palpation, normal fremitus, symmetrical and full respiratory excursions Cardiovascular: normal position and quality of the apical impulse, regular rhythm, normal first and second heart sounds, no murmurs, rubs or gallops Abdomen: no tenderness or distention, no masses by palpation, no abnormal pulsatility or arterial bruits, normal bowel sounds, no hepatosplenomegaly Extremities: no clubbing, cyanosis or edema; 2+ radial, ulnar and brachial pulses bilaterally; 2+ right femoral, posterior tibial and dorsalis pedis pulses; 2+ left femoral, posterior tibial and dorsalis pedis pulses; no subclavian or femoral bruits Neurological: grossly nonfocal   EKG: NSR, left axis deviation  Lipid Panel     Component Value Date/Time   CHOL  Value: 104        ATP III CLASSIFICATION:  <200     mg/dL   Desirable  200-239  mg/dL   Borderline High  >=240    mg/dL   High 06/19/2007 0223   TRIG 102 06/19/2007 0223   HDL 19* 06/19/2007 0223   CHOLHDL 5.5 06/19/2007 0223   VLDL 20 06/19/2007 0223   LDLCALC  Value: 65        Total Cholesterol/HDL:CHD  Risk Coronary Heart Disease Risk Table                     Men   Women  1/2 Average Risk   3.4   3.3 06/19/2007 0223    BMET    Component Value Date/Time   NA 135 03/21/2013 0200   K 3.7 03/21/2013 0200   CL 101 03/21/2013 0200   CO2 24 03/21/2013 0200   GLUCOSE 97 03/21/2013 0200   BUN 16 03/21/2013 0200   CREATININE 1.00 03/21/2013 0200   CALCIUM 8.6 03/21/2013 0200   GFRNONAA 69* 03/21/2013 0200   GFRAA 80* 03/21/2013 0200     ASSESSMENT AND PLAN CAD (coronary artery disease) Asymptomatic. Low risk nuclear perfusion study within last 18 months. Low risk for major cardiovascular complications with planned knee surgery.  Atrial fibrillation Need to increase dose of carvedilol, which was likely not equivalent to previous nebivolol dose. Continue warfarin.  HTN (hypertension) Elevated BP should improve with higher carvedilol dose.  Hyperlipidemia Excellent LDL. Low HDL - no good pharmacological solution for this.   Patient Instructions  Increase Carvedilol to 6.25mg  twice a day.  A new prescription has been sent to your pharmacy.   Your physician recommends that you schedule a follow-up appointment in: 6 months.     Orders Placed This Encounter  Procedures  . EKG 12-Lead   Meds ordered this encounter  Medications  . carvedilol (COREG) 6.25 MG tablet    Sig: Take 1 tablet (6.25 mg total) by mouth 2 (two) times daily.    Dispense:  60 tablet    Refill:  [address], Jerry Cain, [facility] [phone] office [phone] pager

## 2013-05-22 NOTE — Assessment & Plan Note (Signed)
Elevated BP should improve with higher carvedilol dose.

## 2013-05-22 NOTE — Assessment & Plan Note (Signed)
Need to increase dose of carvedilol, which was likely not equivalent to previous nebivolol dose. Continue warfarin.

## 2013-05-27 ENCOUNTER — Ambulatory Visit (INDEPENDENT_AMBULATORY_CARE_PROVIDER_SITE_OTHER): Payer: Medicare Other | Admitting: Pharmacist Clinician (PhC)/ Clinical Pharmacy Specialist

## 2013-05-27 VITALS — BP 170/80 | HR 76

## 2013-05-27 DIAGNOSIS — Z7901 Long term (current) use of anticoagulants: Secondary | ICD-10-CM | POA: Diagnosis not present

## 2013-05-27 DIAGNOSIS — I4891 Unspecified atrial fibrillation: Secondary | ICD-10-CM | POA: Diagnosis not present

## 2013-05-27 LAB — POCT INR: INR: 2.2

## 2013-05-30 ENCOUNTER — Telehealth: Payer: Self-pay | Admitting: *Deleted

## 2013-05-30 NOTE — Telephone Encounter (Signed)
Signed surgical clearance and authorization to Hold Coumadin 5 days prior to surgery and restart ASAP to [employer].

## 2013-06-01 DIAGNOSIS — M1710 Unilateral primary osteoarthritis, unspecified knee: Secondary | ICD-10-CM | POA: Diagnosis not present

## 2013-06-03 ENCOUNTER — Encounter (HOSPITAL_COMMUNITY): Payer: Self-pay | Admitting: Pharmacy Technician

## 2013-06-07 NOTE — Pre-Procedure Instructions (Signed)
Jerry Cain  06/07/2013   Your procedure is scheduled on:  Tues, Mar 10 @ 10:20 AM  Report to [facility] Stay Entrance A  at 8:15 AM.  Call this number if you have problems the morning of surgery: [phone]   Remember:   Do not eat food or drink liquids after midnight.   Take these medicines the morning of surgery with A SIP OF WATER: Carvedilol(Coreg),Proscar(Finasteride),Imdur(Isosorbide),and Synthroid(Levothyroxine)               Stop taking your Aspirin and Coumadin as instructed by your MD. No Goody's,BC's,Aleve,Ibuprofen,Fish Oil,or any Herbal Medications   Do not wear jewelry  Do not wear lotions, powders, or colognes. You may wear deodorant.  Men may shave face and neck.  Do not bring valuables to the hospital.  [facility] is not responsible                  for any belongings or valuables.               Contacts, dentures or bridgework may not be worn into surgery.  Leave suitcase in the car. After surgery it may be brought to your room.  For patients admitted to the hospital, discharge time is determined by your                treatment team.             Special Instructions:  [facility] - Preparing for Surgery   Please follow these instructions carefully:   1.  Shower with CHG Soap the night before surgery and the                                morning of Surgery.  2.  If you choose to wash your hair, wash your hair first as usual with your       normal shampoo.  3.  After you shampoo, rinse your hair and body thoroughly to remove the                      Shampoo.  4.  Use CHG as you would any other liquid soap.  You can apply chg directly       to the skin and wash gently with scrungie or a clean washcloth.  5.  Apply the CHG Soap to your body ONLY FROM THE NECK DOWN.        Do not use on open wounds or open sores.  [facility] - Preparing for Surgery  Before surgery, you can play an important role.  Because skin is not sterile, your skin needs to be  as free of germs as possible.  You can reduce the number of germs on you skin by washing with CHG (chlorahexidine gluconate) soap before surgery.  CHG is an antiseptic cleaner which kills germs and bonds with the skin to continue killing germs even after washing.  Please DO NOT use if you have an allergy to CHG or antibacterial soaps.  If your skin becomes reddened/irritated stop using the CHG and inform your nurse when you arrive at Short Stay.  Do not shave (including legs and underarms) for at least 48 hours prior to the first CHG shower.  You may shave your face.  Please follow these instructions carefully:   1.  Shower with CHG Soap the night before surgery and the  morning of Surgery.  2.  If you choose to wash your hair, wash your hair first as usual with your       normal shampoo.  3.  After you shampoo, rinse your hair and body thoroughly to remove the                      Shampoo.  4.  Use CHG as you would any other liquid soap.  You can apply chg directly       to the skin and wash gently with scrungie or a clean washcloth.  5.  Apply the CHG Soap to your body ONLY FROM THE NECK DOWN.        Do not use on open wounds or open sores.  Avoid contact with your eyes,       ears, mouth and genitals (private parts).  Wash genitals (private parts)       with your normal soap.  6.  Wash thoroughly, paying special attention to the area where your surgery        will be performed.  7.  Thoroughly rinse your body with warm water from the neck down.  8.  DO NOT shower/wash with your normal soap after using and rinsing off       the CHG Soap.  9.  Pat yourself dry with a clean towel.            10.  Wear clean pajamas.            11.  Place clean sheets on your bed the night of your first shower and do not        sleep with pets.  Day of Surgery  Do not apply any lotions/deoderants the morning of surgery.  Please wear clean clothes to the hospital/surgery  center.     Please read over the following fact sheets that you were given: Pain Booklet, Coughing and Deep Breathing, Blood Transfusion Information, MRSA Information and Surgical Site Infection Prevention

## 2013-06-08 ENCOUNTER — Encounter (HOSPITAL_COMMUNITY): Payer: Self-pay

## 2013-06-08 ENCOUNTER — Encounter (HOSPITAL_COMMUNITY)
Admission: RE | Admit: 2013-06-08 | Discharge: 2013-06-08 | Disposition: A | Discharge status: Home / Self Care | Payer: Medicare Other | Source: Ambulatory Visit | Attending: Orthopaedic Surgery | Admitting: Orthopaedic Surgery

## 2013-06-08 DIAGNOSIS — Z01818 Encounter for other preprocedural examination: Secondary | ICD-10-CM | POA: Insufficient documentation

## 2013-06-08 DIAGNOSIS — Z01812 Encounter for preprocedural laboratory examination: Secondary | ICD-10-CM | POA: Insufficient documentation

## 2013-06-08 HISTORY — DX: Anxiety disorder, unspecified: F41.9

## 2013-06-08 HISTORY — DX: Adverse effect of unspecified anesthetic, initial encounter: T41.45XA

## 2013-06-08 HISTORY — DX: Effusion, unspecified joint: M25.40

## 2013-06-08 HISTORY — DX: Unspecified atrial fibrillation: I48.91

## 2013-06-08 HISTORY — DX: Nocturia: R35.1

## 2013-06-08 HISTORY — DX: Anesthesia of skin: R20.0

## 2013-06-08 HISTORY — DX: Carpal tunnel syndrome, unspecified upper limb: G56.00

## 2013-06-08 HISTORY — DX: Pain in unspecified joint: M25.50

## 2013-06-08 HISTORY — DX: Pneumonia, unspecified organism: J18.9

## 2013-06-08 HISTORY — DX: Other complications of anesthesia, initial encounter: T88.59XA

## 2013-06-08 LAB — COMPREHENSIVE METABOLIC PANEL
ALT: 30 U/L (ref 0–53)
AST: 28 U/L (ref 0–37)
Albumin: 3.7 g/dL (ref 3.5–5.2)
Alkaline Phosphatase: 56 U/L (ref 39–117)
BILIRUBIN TOTAL: 0.9 mg/dL (ref 0.3–1.2)
BUN: 19 mg/dL (ref 6–23)
CALCIUM: 9 mg/dL (ref 8.4–10.5)
CO2: 26 mEq/L (ref 19–32)
CREATININE: 0.92 mg/dL (ref 0.50–1.35)
Chloride: 105 mEq/L (ref 96–112)
GFR calc non Af Amer: 78 mL/min — ABNORMAL LOW (ref >90)
Glucose, Bld: 91 mg/dL (ref 70–99)
Potassium: 4.6 mEq/L (ref 3.7–5.3)
Sodium: 142 mEq/L (ref 137–147)
Total Protein: 6.8 g/dL (ref 6.0–8.3)

## 2013-06-08 LAB — TYPE AND SCREEN
ABO/RH(D): O POS
Antibody Screen: NEGATIVE

## 2013-06-08 LAB — CBC WITH DIFFERENTIAL/PLATELET
BASOS ABS: 0 10*3/uL (ref 0.0–0.1)
Basophils Relative: 0 % (ref 0–1)
EOS PCT: 6 % — AB (ref 0–5)
Eosinophils Absolute: 0.4 10*3/uL (ref 0.0–0.7)
HEMATOCRIT: 37.1 % — AB (ref 39.0–52.0)
HEMOGLOBIN: 12.7 g/dL — AB (ref 13.0–17.0)
Lymphocytes Relative: 24 % (ref 12–46)
Lymphs Abs: 1.5 10*3/uL (ref 0.7–4.0)
MCH: 31 pg (ref 26.0–34.0)
MCHC: 34.2 g/dL (ref 30.0–36.0)
MCV: 90.5 fL (ref 78.0–100.0)
MONOS PCT: 10 % (ref 3–12)
Monocytes Absolute: 0.6 10*3/uL (ref 0.1–1.0)
Neutro Abs: 3.7 10*3/uL (ref 1.7–7.7)
Neutrophils Relative %: 60 % (ref 43–77)
Platelets: 164 10*3/uL (ref 150–400)
RBC: 4.1 MIL/uL — ABNORMAL LOW (ref 4.22–5.81)
RDW: 14.7 % (ref 11.5–15.5)
WBC: 6.2 10*3/uL (ref 4.0–10.5)

## 2013-06-08 LAB — URINALYSIS, ROUTINE W REFLEX MICROSCOPIC
BILIRUBIN URINE: NEGATIVE
GLUCOSE, UA: NEGATIVE mg/dL
Ketones, ur: NEGATIVE mg/dL
LEUKOCYTES UA: NEGATIVE
Nitrite: NEGATIVE
PH: 5.5 (ref 5.0–8.0)
PROTEIN: NEGATIVE mg/dL
Specific Gravity, Urine: 1.013 (ref 1.005–1.030)
Urobilinogen, UA: 0.2 mg/dL (ref 0.0–1.0)

## 2013-06-08 LAB — URINE MICROSCOPIC-ADD ON

## 2013-06-08 LAB — APTT: aPTT: 35 seconds (ref 24–37)

## 2013-06-08 LAB — ABO/RH: ABO/RH(D): O POS

## 2013-06-08 LAB — PROTIME-INR
INR: 2.06 — AB (ref 0.00–1.49)
Prothrombin Time: 22.6 seconds — ABNORMAL HIGH (ref 11.6–15.2)

## 2013-06-08 LAB — SURGICAL PCR SCREEN
MRSA, PCR: NEGATIVE
Staphylococcus aureus: NEGATIVE

## 2013-06-08 MED ORDER — CHLORHEXIDINE GLUCONATE 4 % EX LIQD: 60.0000 mL | Freq: Every day | CUTANEOUS | Status: DC

## 2013-06-08 MED ORDER — CHLORHEXIDINE GLUCONATE 4 % EX LIQD: 60.0000 mL | Freq: Once | CUTANEOUS | Status: DC

## 2013-06-08 NOTE — Progress Notes (Addendum)
Last time taking Nitroglycerin was in Dec 2014   Cardiologist is Dr.C with last visit in 2015-clearance note  Heart cath in epic from 2009  Echo report in epic from 2009  Stress test reports in epic from 2009/2012/2013  Medical Md is Dr.Cynthia BUtler  EKG in epic from 05-13-13  CXR in epic from 03-20-13

## 2013-06-08 NOTE — H&P (Signed)
CHIEF COMPLAINT:  Painful left knee.   HISTORY OF PRESENT ILLNESS:  [name] is a very pleasant 80 year old white male who is seen today for evaluation of his left knee.  He is a patient of Dr. Melina Cain who has had trouble over about 20 years of pain and discomfort in the left knee.  He is fully aware of the fact that it is arthritic in nature.  He, however, does have a sawmill and is having difficulty with being able to perform work in the [employer].  He has difficulty with walking, bending, stooping, and squatting.  He cannot even ride his horse of the pain and discomfort.  Difficulty with sleeping as well as getting in and out of a car.  He has actually reached a point now when he tries to walk his property, he has to use an all-wheel drive vehicle just because he cannot walk anymore because of his left knee pain.  He is having mechanical symptoms in the left knee.  He does not remember any obvious history of injury or trauma.  He has been tried with ibuprofen, glucosamine, even liniment and Biofreeze which is not very beneficial.  He uses a cane, even tried a brace.  He even tries to swim at the [facility], but he is having more and more difficulty with getting in and out of the pool because of his left knee.  Seen today for evaluation.   PAST MEDICAL HISTORY:   His health is fair.   PAST SURGICAL HISTORY:   In 2005 for cataract bilateral removal, 2006 left inguinal herniorrhaphy, 2015 foot surgery on the right, 2006 for stent x3.  He did have pyrexia and was hospitalized in 2014.  He also had a cardioversion in September of 2013 for atrial fibrillation.     CURRENT MEDICATIONS:   Aspirin 81 mg q. a.m., Lipitor 40 mg q. P.m. , calcium carbonate vitamin D 1 tablet b.i.d.,  Coreg 6.25 mg tablets, 2 tablets in the morning,  Proscar 5 mg q. A.m.,  glucosamine 1 tablet b.i.d.,  Imdur 30 mg at bedtime,  levothyroxine 88 mcg q. A.m.,  lisinopril 20 mg at bedtime,  multivitamin 1 daily in the morning,  Nitrostat 0.4 mg sublingual under the tongue every 5 minutes as needed,  Metamucil 1 pack daily at bedtime,  Flomax 0.4 mg at bedtime, Coumadin 1 tablet daily as directed at 5 mg.     ALLERGIES:   Penicillin and he states the pain medicine that he had after foot surgery which he does not remember the name.     REVIEW OF SYSTEMS:   A 14-point review of systems positive for bilateral cataract stretch.  He does not need to use glasses at this time.  He does have some difficulty with hearing.  He has a partial upper.  He did have pneumonia with his foot surgery.  He does have atrial fibrillation and has had 1 cardioversion.  This started in 2008 with the cardioversion in 2013.  He has had hypertension 2006 when he was stented x3.  Occasional leg cramps.  He is hypothyroid and is on levothyroxine.  He does have a history of gout.  Also has prosthetic hypertrophy and this caused him to have urinary problems especially nocturia x3.  Apparently he had frequent urination after having a chiropractor do something to his back.  He has had a weight gain of 10 pounds since his foot surgery.   FAMILY HISTORY:   Positive for mother who is  deceased at age 51 and apparently was secondary to a incorrect med.  Father who is deceased at age 84 from pneumonia.  He has 1 living brother at 75 and 1 at age 69.  The 40 year old has already had a myocardial infarction at 80 years of age and has atrial fibrillation also.  Sister who is alive and well at age 20.    SOCIAL HISTORY:   [name] is a 80 year old white married male.  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  He does not smoke or drink.     PHYSICAL EXAMINATION:  A very pleasant 80 year old white male, well-developed, well-nourished in moderate distress.  Height is 6 feet, 0 inches.  Weight 204 pounds.  BMI is 27.7.     Vital signs:  Temperature 98.3.  Pulse 75.  Respirations 20.  Blood pressure 168/101.   Head is  normocephalic.   Eyes:  Pupils round, reactive to light and accommodation with extraocular movements intact.   Ears, nose and throat were benign. Neck was supple.  There is a very soft bruit bilaterally. Chest had good expansion. Lungs were essentially clear.   Cardiac had a regular rhythm and rate with an occasional ectopic.  Maybe a grade 1 systolic murmur only. Pulses were 1-2+ bilateral and symmetric in the lower extremities.  Abdomen scaphoid, soft, is nontender, no mass is palpable.  Normal bowel sounds are present.   CNS:  He is oriented x3 and cranial nerves II-XII grossly intact.   Musculoskeletal:  He has range of motion of about 4-5 degrees to about 110 degrees.  He does open medially with valgus stressing.  He appears to be varus.  Trace effusion.  Crepitus with range of motion.  No real pitting edema today.     RADIOGRAPHS:  Radiographs studies are reviewed from July 2014 reveals end-stage DJD with essentially no medial joint space.  He does have tricompartment OA with periarticular spurring noted.  He has the appearance of loose bodies present.  Cystic changes throughout the cortical surfaces of the medial and lateral and patellofemoral compartments.     CLINICAL IMPRESSION:   1.  Advanced DJD of the left knee. 2.  History of atrial fibrillation on Coumadin. 3.  History of hypertension. 4.  History of hypothyroidism. 5.  History of gout. 6.  Prosthetic hypertrophy.     RECOMMENDATIONS:  At this time we reviewed all of his information and feel that a left total knee replacement would be indicated.     I have reviewed a medical clearance form by Dr. Melina Cain who feels that he is medically able to have surgery.  Cardiology felt that we could hold its Coumadin for 5 days for his knee surgery.  None of his blood pressure medicines should be stopped.    Jerry Cain, [place] [phone]  06/08/2013 11:46 AM

## 2013-06-08 NOTE — Progress Notes (Signed)
06/08/13 1044  OBSTRUCTIVE SLEEP APNEA  Have you ever been diagnosed with sleep apnea through a sleep study? No  Do you snore loudly (loud enough to be heard through closed doors)?  1  Do you often feel tired, fatigued, or sleepy during the daytime? 0  Has anyone observed you stop breathing during your sleep? 0  Do you have, or are you being treated for high blood pressure? 1  BMI more than 35 kg/m2? 0  Age over 78 years old? 1  Neck circumference greater than 40 cm/18 inches? 0  Gender: 1  Obstructive Sleep Apnea Score 4  Score 4 or greater  Results sent to PCP

## 2013-06-09 LAB — URINE CULTURE
Colony Count: NO GROWTH
Culture: NO GROWTH

## 2013-06-13 MED ORDER — VANCOMYCIN HCL IN DEXTROSE 1-5 GM/200ML-% IV SOLN
1000.0000 mg | INTRAVENOUS | Status: AC
  Administered 2013-06-14: 1000 mg via INTRAVENOUS
  Filled 2013-06-13: qty 200

## 2013-06-14 ENCOUNTER — Encounter (HOSPITAL_COMMUNITY)
Admission: RE | Disposition: A | Discharge status: Skilled Nursing Facility | Payer: Self-pay | Source: Ambulatory Visit | Attending: Orthopaedic Surgery

## 2013-06-14 ENCOUNTER — Encounter (HOSPITAL_COMMUNITY): Payer: Medicare Other | Admitting: Anesthesiology

## 2013-06-14 ENCOUNTER — Encounter (HOSPITAL_COMMUNITY): Payer: Self-pay | Admitting: Anesthesiology

## 2013-06-14 ENCOUNTER — Inpatient Hospital Stay (HOSPITAL_COMMUNITY)
Admission: RE | Admit: 2013-06-14 | Discharge: 2013-06-17 | DRG: 470 | Disposition: A | Discharge status: Skilled Nursing Facility | Payer: Medicare Other | Source: Ambulatory Visit | Attending: Orthopaedic Surgery | Admitting: Orthopaedic Surgery

## 2013-06-14 ENCOUNTER — Inpatient Hospital Stay (HOSPITAL_COMMUNITY): Payer: Medicare Other | Admitting: Anesthesiology

## 2013-06-14 DIAGNOSIS — M25569 Pain in unspecified knee: Secondary | ICD-10-CM | POA: Diagnosis not present

## 2013-06-14 DIAGNOSIS — G5600 Carpal tunnel syndrome, unspecified upper limb: Secondary | ICD-10-CM | POA: Diagnosis not present

## 2013-06-14 DIAGNOSIS — M2540 Effusion, unspecified joint: Secondary | ICD-10-CM | POA: Diagnosis not present

## 2013-06-14 DIAGNOSIS — M1710 Unilateral primary osteoarthritis, unspecified knee: Principal | ICD-10-CM | POA: Diagnosis present

## 2013-06-14 DIAGNOSIS — Z7982 Long term (current) use of aspirin: Secondary | ICD-10-CM

## 2013-06-14 DIAGNOSIS — Z7901 Long term (current) use of anticoagulants: Secondary | ICD-10-CM | POA: Diagnosis not present

## 2013-06-14 DIAGNOSIS — I10 Essential (primary) hypertension: Secondary | ICD-10-CM | POA: Diagnosis present

## 2013-06-14 DIAGNOSIS — D62 Acute posthemorrhagic anemia: Secondary | ICD-10-CM | POA: Diagnosis not present

## 2013-06-14 DIAGNOSIS — R351 Nocturia: Secondary | ICD-10-CM | POA: Diagnosis not present

## 2013-06-14 DIAGNOSIS — D126 Benign neoplasm of colon, unspecified: Secondary | ICD-10-CM | POA: Diagnosis not present

## 2013-06-14 DIAGNOSIS — J189 Pneumonia, unspecified organism: Secondary | ICD-10-CM | POA: Diagnosis not present

## 2013-06-14 DIAGNOSIS — E039 Hypothyroidism, unspecified: Secondary | ICD-10-CM | POA: Diagnosis present

## 2013-06-14 DIAGNOSIS — F411 Generalized anxiety disorder: Secondary | ICD-10-CM | POA: Diagnosis not present

## 2013-06-14 DIAGNOSIS — M129 Arthropathy, unspecified: Secondary | ICD-10-CM | POA: Diagnosis not present

## 2013-06-14 DIAGNOSIS — M25469 Effusion, unspecified knee: Secondary | ICD-10-CM | POA: Diagnosis not present

## 2013-06-14 DIAGNOSIS — Z96659 Presence of unspecified artificial knee joint: Secondary | ICD-10-CM

## 2013-06-14 DIAGNOSIS — E7800 Pure hypercholesterolemia, unspecified: Secondary | ICD-10-CM | POA: Diagnosis not present

## 2013-06-14 DIAGNOSIS — S8990XA Unspecified injury of unspecified lower leg, initial encounter: Secondary | ICD-10-CM | POA: Diagnosis not present

## 2013-06-14 DIAGNOSIS — M109 Gout, unspecified: Secondary | ICD-10-CM | POA: Diagnosis present

## 2013-06-14 DIAGNOSIS — G8918 Other acute postprocedural pain: Secondary | ICD-10-CM | POA: Diagnosis not present

## 2013-06-14 DIAGNOSIS — K219 Gastro-esophageal reflux disease without esophagitis: Secondary | ICD-10-CM | POA: Diagnosis not present

## 2013-06-14 DIAGNOSIS — I4891 Unspecified atrial fibrillation: Secondary | ICD-10-CM | POA: Diagnosis not present

## 2013-06-14 DIAGNOSIS — Z9861 Coronary angioplasty status: Secondary | ICD-10-CM

## 2013-06-14 DIAGNOSIS — I2510 Atherosclerotic heart disease of native coronary artery without angina pectoris: Secondary | ICD-10-CM | POA: Diagnosis not present

## 2013-06-14 DIAGNOSIS — K5730 Diverticulosis of large intestine without perforation or abscess without bleeding: Secondary | ICD-10-CM | POA: Diagnosis not present

## 2013-06-14 DIAGNOSIS — M1990 Unspecified osteoarthritis, unspecified site: Secondary | ICD-10-CM | POA: Diagnosis not present

## 2013-06-14 DIAGNOSIS — N400 Enlarged prostate without lower urinary tract symptoms: Secondary | ICD-10-CM | POA: Diagnosis not present

## 2013-06-14 DIAGNOSIS — M6281 Muscle weakness (generalized): Secondary | ICD-10-CM | POA: Diagnosis not present

## 2013-06-14 DIAGNOSIS — R209 Unspecified disturbances of skin sensation: Secondary | ICD-10-CM | POA: Diagnosis not present

## 2013-06-14 DIAGNOSIS — M1712 Unilateral primary osteoarthritis, left knee: Secondary | ICD-10-CM | POA: Diagnosis present

## 2013-06-14 DIAGNOSIS — R262 Difficulty in walking, not elsewhere classified: Secondary | ICD-10-CM | POA: Diagnosis not present

## 2013-06-14 HISTORY — PX: TOTAL KNEE ARTHROPLASTY: SHX125

## 2013-06-14 LAB — CBC
HEMATOCRIT: 36.5 % — AB (ref 39.0–52.0)
Hemoglobin: 12.4 g/dL — ABNORMAL LOW (ref 13.0–17.0)
MCH: 30.8 pg (ref 26.0–34.0)
MCHC: 34 g/dL (ref 30.0–36.0)
MCV: 90.8 fL (ref 78.0–100.0)
Platelets: 132 10*3/uL — ABNORMAL LOW (ref 150–400)
RBC: 4.02 MIL/uL — AB (ref 4.22–5.81)
RDW: 14.5 % (ref 11.5–15.5)
WBC: 6.7 10*3/uL (ref 4.0–10.5)

## 2013-06-14 LAB — PROTIME-INR
INR: 1.23 (ref 0.00–1.49)
Prothrombin Time: 15.2 seconds (ref 11.6–15.2)

## 2013-06-14 LAB — CREATININE, SERUM
Creatinine, Ser: 0.8 mg/dL (ref 0.50–1.35)
GFR calc Af Amer: >90 mL/min (ref >90)
GFR, EST NON AFRICAN AMERICAN: 83 mL/min — AB (ref >90)

## 2013-06-14 SURGERY — ARTHROPLASTY, KNEE, TOTAL
Anesthesia: Regional | Site: Knee | Laterality: Left

## 2013-06-14 MED ORDER — ONDANSETRON HCL 4 MG/2ML IJ SOLN
INTRAMUSCULAR | Status: AC
Start: 2013-06-14 — End: 2013-06-14
  Filled 2013-06-14: qty 2

## 2013-06-14 MED ORDER — TAMSULOSIN HCL 0.4 MG PO CAPS
0.4000 mg | ORAL_CAPSULE | Freq: Every day | ORAL | Status: DC
  Administered 2013-06-14 – 2013-06-16 (×3): 0.4 mg via ORAL
  Filled 2013-06-14 (×4): qty 1

## 2013-06-14 MED ORDER — SODIUM CHLORIDE 0.9 % IR SOLN
Status: DC | PRN
  Administered 2013-06-14 (×3): 1000 mL

## 2013-06-14 MED ORDER — FENTANYL CITRATE 0.05 MG/ML IJ SOLN
INTRAMUSCULAR | Status: AC
  Filled 2013-06-14: qty 5

## 2013-06-14 MED ORDER — FINASTERIDE 5 MG PO TABS
5.0000 mg | ORAL_TABLET | Freq: Every morning | ORAL | Status: DC
  Administered 2013-06-15 – 2013-06-17 (×3): 5 mg via ORAL
  Filled 2013-06-14 (×4): qty 1

## 2013-06-14 MED ORDER — ACETAMINOPHEN 325 MG PO TABS: 650.0000 mg | ORAL_TABLET | Freq: Four times a day (QID) | ORAL | Status: DC | PRN

## 2013-06-14 MED ORDER — METOCLOPRAMIDE HCL 5 MG/ML IJ SOLN: 5.0000 mg | Freq: Three times a day (TID) | INTRAMUSCULAR | Status: DC | PRN

## 2013-06-14 MED ORDER — ROCURONIUM BROMIDE 50 MG/5ML IV SOLN
INTRAVENOUS | Status: AC
  Filled 2013-06-14: qty 1

## 2013-06-14 MED ORDER — PROPOFOL 10 MG/ML IV BOLUS
INTRAVENOUS | Status: AC
  Filled 2013-06-14: qty 20

## 2013-06-14 MED ORDER — SODIUM CHLORIDE 0.9 % IV SOLN: INTRAVENOUS | Status: DC

## 2013-06-14 MED ORDER — NITROGLYCERIN 0.4 MG SL SUBL: 0.4000 mg | SUBLINGUAL_TABLET | SUBLINGUAL | Status: DC | PRN

## 2013-06-14 MED ORDER — WARFARIN SODIUM 7.5 MG PO TABS
7.5000 mg | ORAL_TABLET | Freq: Once | ORAL | Status: AC
  Administered 2013-06-14: 7.5 mg via ORAL
  Filled 2013-06-14 (×2): qty 1

## 2013-06-14 MED ORDER — METOCLOPRAMIDE HCL 10 MG PO TABS: 5.0000 mg | ORAL_TABLET | Freq: Three times a day (TID) | ORAL | Status: DC | PRN

## 2013-06-14 MED ORDER — PROPOFOL 10 MG/ML IV BOLUS
INTRAVENOUS | Status: DC | PRN
  Administered 2013-06-14: 160 mg via INTRAVENOUS

## 2013-06-14 MED ORDER — LACTATED RINGERS IV SOLN
INTRAVENOUS | Status: DC | PRN
  Administered 2013-06-14 (×2): via INTRAVENOUS

## 2013-06-14 MED ORDER — FLEET ENEMA 7-19 GM/118ML RE ENEM: 1.0000 | ENEMA | Freq: Once | RECTAL | Status: AC | PRN

## 2013-06-14 MED ORDER — EPHEDRINE SULFATE 50 MG/ML IJ SOLN
INTRAMUSCULAR | Status: DC | PRN
  Administered 2013-06-14 (×2): 10 mg via INTRAVENOUS

## 2013-06-14 MED ORDER — ISOSORBIDE MONONITRATE ER 30 MG PO TB24
30.0000 mg | ORAL_TABLET | Freq: Every morning | ORAL | Status: DC
  Administered 2013-06-15 – 2013-06-17 (×3): 30 mg via ORAL
  Filled 2013-06-14 (×3): qty 1

## 2013-06-14 MED ORDER — FENTANYL CITRATE 0.05 MG/ML IJ SOLN
INTRAMUSCULAR | Status: DC | PRN
  Administered 2013-06-14: 25 ug via INTRAVENOUS
  Administered 2013-06-14: 50 ug via INTRAVENOUS
  Administered 2013-06-14: 25 ug via INTRAVENOUS
  Administered 2013-06-14: 50 ug via INTRAVENOUS

## 2013-06-14 MED ORDER — LIDOCAINE HCL (CARDIAC) 20 MG/ML IV SOLN
INTRAVENOUS | Status: DC | PRN
  Administered 2013-06-14: 60 mg via INTRAVENOUS

## 2013-06-14 MED ORDER — HYDROMORPHONE HCL PF 1 MG/ML IJ SOLN
0.5000 mg | INTRAMUSCULAR | Status: DC | PRN
  Administered 2013-06-16 (×2): 0.5 mg via INTRAVENOUS
  Filled 2013-06-14 (×2): qty 1

## 2013-06-14 MED ORDER — OXYCODONE HCL 5 MG PO TABS
5.0000 mg | ORAL_TABLET | ORAL | Status: DC | PRN
  Administered 2013-06-14 – 2013-06-15 (×3): 5 mg via ORAL
  Administered 2013-06-16: 10 mg via ORAL
  Administered 2013-06-16 (×3): 5 mg via ORAL
  Filled 2013-06-14: qty 2
  Filled 2013-06-14 (×2): qty 1
  Filled 2013-06-14 (×2): qty 2
  Filled 2013-06-14: qty 1
  Filled 2013-06-14: qty 2

## 2013-06-14 MED ORDER — MIDAZOLAM HCL 2 MG/2ML IJ SOLN
INTRAMUSCULAR | Status: AC
  Filled 2013-06-14: qty 2

## 2013-06-14 MED ORDER — ACETAMINOPHEN 650 MG RE SUPP: 650.0000 mg | Freq: Four times a day (QID) | RECTAL | Status: DC | PRN

## 2013-06-14 MED ORDER — FENTANYL CITRATE 0.05 MG/ML IJ SOLN
INTRAMUSCULAR | Status: AC
  Administered 2013-06-14: 50 ug
  Filled 2013-06-14: qty 2

## 2013-06-14 MED ORDER — LACTATED RINGERS IV SOLN
INTRAVENOUS | Status: DC
  Administered 2013-06-14: 09:00:00 via INTRAVENOUS

## 2013-06-14 MED ORDER — DEXAMETHASONE SODIUM PHOSPHATE 10 MG/ML IJ SOLN
INTRAMUSCULAR | Status: DC | PRN
  Administered 2013-06-14: 10 mg via INTRAVENOUS

## 2013-06-14 MED ORDER — ASPIRIN EC 81 MG PO TBEC
81.0000 mg | DELAYED_RELEASE_TABLET | Freq: Every morning | ORAL | Status: DC
  Administered 2013-06-15 – 2013-06-17 (×3): 81 mg via ORAL
  Filled 2013-06-14 (×3): qty 1

## 2013-06-14 MED ORDER — BISACODYL 10 MG RE SUPP: 10.0000 mg | Freq: Every day | RECTAL | Status: DC | PRN

## 2013-06-14 MED ORDER — ONDANSETRON HCL 4 MG PO TABS: 4.0000 mg | ORAL_TABLET | Freq: Four times a day (QID) | ORAL | Status: DC | PRN

## 2013-06-14 MED ORDER — MIDAZOLAM HCL 2 MG/2ML IJ SOLN
INTRAMUSCULAR | Status: AC
  Administered 2013-06-14: 1 mg
  Filled 2013-06-14: qty 2

## 2013-06-14 MED ORDER — BUPIVACAINE-EPINEPHRINE (PF) 0.25% -1:200000 IJ SOLN
INTRAMUSCULAR | Status: AC
Start: 2013-06-14 — End: 2013-06-14
  Filled 2013-06-14: qty 30

## 2013-06-14 MED ORDER — LEVOTHYROXINE SODIUM 88 MCG PO TABS
88.0000 ug | ORAL_TABLET | Freq: Every morning | ORAL | Status: DC
  Administered 2013-06-15 – 2013-06-17 (×3): 88 ug via ORAL
  Filled 2013-06-14 (×3): qty 1

## 2013-06-14 MED ORDER — KETOROLAC TROMETHAMINE 15 MG/ML IJ SOLN
15.0000 mg | Freq: Four times a day (QID) | INTRAMUSCULAR | Status: AC
  Administered 2013-06-14 – 2013-06-15 (×2): 15 mg via INTRAVENOUS
  Filled 2013-06-14 (×2): qty 1

## 2013-06-14 MED ORDER — PHENOL 1.4 % MT LIQD: 1.0000 | OROMUCOSAL | Status: DC | PRN

## 2013-06-14 MED ORDER — FENTANYL CITRATE 0.05 MG/ML IJ SOLN: 25.0000 ug | INTRAMUSCULAR | Status: DC | PRN

## 2013-06-14 MED ORDER — SODIUM CHLORIDE 0.9 % IJ SOLN
INTRAMUSCULAR | Status: AC
  Filled 2013-06-14: qty 10

## 2013-06-14 MED ORDER — BUPIVACAINE-EPINEPHRINE 0.25% -1:200000 IJ SOLN
INTRAMUSCULAR | Status: DC | PRN
  Administered 2013-06-14: 30 mL

## 2013-06-14 MED ORDER — ACETAMINOPHEN 10 MG/ML IV SOLN
1000.0000 mg | Freq: Four times a day (QID) | INTRAVENOUS | Status: AC
  Filled 2013-06-14 (×4): qty 100

## 2013-06-14 MED ORDER — MEPERIDINE HCL 25 MG/ML IJ SOLN: 6.2500 mg | INTRAMUSCULAR | Status: DC | PRN

## 2013-06-14 MED ORDER — METHOCARBAMOL 100 MG/ML IJ SOLN
500.0000 mg | Freq: Four times a day (QID) | INTRAVENOUS | Status: DC | PRN
  Filled 2013-06-14: qty 5

## 2013-06-14 MED ORDER — EPHEDRINE SULFATE 50 MG/ML IJ SOLN
INTRAMUSCULAR | Status: AC
  Filled 2013-06-14: qty 1

## 2013-06-14 MED ORDER — ENOXAPARIN SODIUM 30 MG/0.3ML ~~LOC~~ SOLN
30.0000 mg | Freq: Two times a day (BID) | SUBCUTANEOUS | Status: DC
  Administered 2013-06-15 – 2013-06-17 (×5): 30 mg via SUBCUTANEOUS
  Filled 2013-06-14 (×8): qty 0.3

## 2013-06-14 MED ORDER — PHENYLEPHRINE HCL 10 MG/ML IJ SOLN
INTRAMUSCULAR | Status: DC | PRN
  Administered 2013-06-14 (×5): 80 ug via INTRAVENOUS

## 2013-06-14 MED ORDER — PROMETHAZINE HCL 25 MG/ML IJ SOLN: 6.2500 mg | INTRAMUSCULAR | Status: DC | PRN

## 2013-06-14 MED ORDER — ONDANSETRON HCL 4 MG/2ML IJ SOLN
INTRAMUSCULAR | Status: DC | PRN
  Administered 2013-06-14: 4 mg via INTRAVENOUS

## 2013-06-14 MED ORDER — WARFARIN - PHARMACIST DOSING INPATIENT: Freq: Every day | Status: DC

## 2013-06-14 MED ORDER — ACETAMINOPHEN 10 MG/ML IV SOLN
1000.0000 mg | INTRAVENOUS | Status: AC
  Administered 2013-06-14: 1000 mg via INTRAVENOUS
  Filled 2013-06-14: qty 100

## 2013-06-14 MED ORDER — CARVEDILOL 6.25 MG PO TABS
6.2500 mg | ORAL_TABLET | Freq: Two times a day (BID) | ORAL | Status: DC
  Administered 2013-06-14 – 2013-06-17 (×6): 6.25 mg via ORAL
  Filled 2013-06-14 (×7): qty 1

## 2013-06-14 MED ORDER — DOCUSATE SODIUM 100 MG PO CAPS
100.0000 mg | ORAL_CAPSULE | Freq: Two times a day (BID) | ORAL | Status: DC
Start: 2013-06-14 — End: 2013-06-17
  Administered 2013-06-14 – 2013-06-17 (×6): 100 mg via ORAL
  Filled 2013-06-14 (×7): qty 1

## 2013-06-14 MED ORDER — LIDOCAINE HCL (CARDIAC) 20 MG/ML IV SOLN
INTRAVENOUS | Status: AC
  Filled 2013-06-14: qty 5

## 2013-06-14 MED ORDER — BUPIVACAINE HCL (PF) 0.5 % IJ SOLN
INTRAMUSCULAR | Status: DC | PRN
  Administered 2013-06-14: 30 mL via PERINEURAL

## 2013-06-14 MED ORDER — METHOCARBAMOL 500 MG PO TABS
500.0000 mg | ORAL_TABLET | Freq: Four times a day (QID) | ORAL | Status: DC | PRN
  Filled 2013-06-14: qty 1

## 2013-06-14 MED ORDER — SODIUM CHLORIDE 0.9 % IV SOLN
75.0000 mL/h | INTRAVENOUS | Status: DC
  Administered 2013-06-14: 75 mL/h via INTRAVENOUS

## 2013-06-14 MED ORDER — MAGNESIUM HYDROXIDE 400 MG/5ML PO SUSP
30.0000 mL | Freq: Every day | ORAL | Status: DC | PRN
  Administered 2013-06-16: 30 mL via ORAL
  Filled 2013-06-14: qty 30

## 2013-06-14 MED ORDER — ONDANSETRON HCL 4 MG/2ML IJ SOLN: 4.0000 mg | Freq: Four times a day (QID) | INTRAMUSCULAR | Status: DC | PRN

## 2013-06-14 MED ORDER — LISINOPRIL 20 MG PO TABS
20.0000 mg | ORAL_TABLET | Freq: Every day | ORAL | Status: DC
  Administered 2013-06-14 – 2013-06-15 (×2): 20 mg via ORAL
  Filled 2013-06-14 (×5): qty 1

## 2013-06-14 MED ORDER — VANCOMYCIN HCL IN DEXTROSE 1-5 GM/200ML-% IV SOLN
1000.0000 mg | Freq: Two times a day (BID) | INTRAVENOUS | Status: AC
  Administered 2013-06-14: 1000 mg via INTRAVENOUS
  Filled 2013-06-14: qty 200

## 2013-06-14 MED ORDER — ALUM & MAG HYDROXIDE-SIMETH 200-200-20 MG/5ML PO SUSP: 30.0000 mL | ORAL | Status: DC | PRN

## 2013-06-14 MED ORDER — MENTHOL 3 MG MT LOZG
1.0000 | LOZENGE | OROMUCOSAL | Status: DC | PRN
  Filled 2013-06-14: qty 9

## 2013-06-14 SURGICAL SUPPLY — 69 items
BANDAGE ESMARK 6X9 LF (GAUZE/BANDAGES/DRESSINGS) ×1 IMPLANT
BLADE SAGITTAL 25.0X1.19X90 (BLADE) ×2 IMPLANT
BLADE SAGITTAL 25.0X1.19X90MM (BLADE) ×1
BNDG CMPR 9X6 STRL LF SNTH (GAUZE/BANDAGES/DRESSINGS) ×1
BNDG ESMARK 6X9 LF (GAUZE/BANDAGES/DRESSINGS) ×3
BOWL SMART MIX CTS (DISPOSABLE) ×3 IMPLANT
CAPT RP KNEE ×2 IMPLANT
CEMENT HV SMART SET (Cement) ×6 IMPLANT
COVER BACK TABLE 24X17X13 BIG (DRAPES) ×1 IMPLANT
COVER SURGICAL LIGHT HANDLE (MISCELLANEOUS) ×3 IMPLANT
CUFF TOURNIQUET SINGLE 34IN LL (TOURNIQUET CUFF) ×2 IMPLANT
CUFF TOURNIQUET SINGLE 44IN (TOURNIQUET CUFF) IMPLANT
DRAPE EXTREMITY T 121X128X90 (DRAPE) ×3 IMPLANT
DRAPE PROXIMA HALF (DRAPES) ×3 IMPLANT
DRSG ADAPTIC 3X8 NADH LF (GAUZE/BANDAGES/DRESSINGS) ×3 IMPLANT
DRSG PAD ABDOMINAL 8X10 ST (GAUZE/BANDAGES/DRESSINGS) ×4 IMPLANT
DURAPREP 26ML APPLICATOR (WOUND CARE) ×3 IMPLANT
ELECT CAUTERY BLADE 6.4 (BLADE) ×3 IMPLANT
ELECT REM PT RETURN 9FT ADLT (ELECTROSURGICAL) ×3
ELECTRODE REM PT RTRN 9FT ADLT (ELECTROSURGICAL) ×1 IMPLANT
EVACUATOR 1/8 PVC DRAIN (DRAIN) ×2 IMPLANT
FACESHIELD LNG OPTICON STERILE (SAFETY) ×6 IMPLANT
FLOSEAL 10ML (HEMOSTASIS) IMPLANT
GLOVE BIO SURGEON STRL SZ7 (GLOVE) ×4 IMPLANT
GLOVE BIOGEL PI IND STRL 6.5 (GLOVE) IMPLANT
GLOVE BIOGEL PI IND STRL 8 (GLOVE) ×1 IMPLANT
GLOVE BIOGEL PI IND STRL 8.5 (GLOVE) ×1 IMPLANT
GLOVE BIOGEL PI INDICATOR 6.5 (GLOVE) ×2
GLOVE BIOGEL PI INDICATOR 8 (GLOVE) ×4
GLOVE BIOGEL PI INDICATOR 8.5 (GLOVE) ×2
GLOVE BIOGEL PI ORTHO PRO 7.5 (GLOVE) ×2
GLOVE ECLIPSE 8.0 STRL XLNG CF (GLOVE) ×10 IMPLANT
GLOVE PI ORTHO PRO STRL 7.5 (GLOVE) IMPLANT
GLOVE SURG ORTHO 8.5 STRL (GLOVE) ×8 IMPLANT
GLOVE SURG SS PI 6.0 STRL IVOR (GLOVE) ×2 IMPLANT
GLOVE SURG SS PI 7.5 STRL IVOR (GLOVE) ×2 IMPLANT
GOWN STRL REUS W/ TWL LRG LVL3 (GOWN DISPOSABLE) ×2 IMPLANT
GOWN STRL REUS W/TWL 2XL LVL3 (GOWN DISPOSABLE) ×5 IMPLANT
GOWN STRL REUS W/TWL LRG LVL3 (GOWN DISPOSABLE) ×6
HANDPIECE INTERPULSE COAX TIP (DISPOSABLE) ×3
KIT BASIN OR (CUSTOM PROCEDURE TRAY) ×3 IMPLANT
KIT ROOM TURNOVER OR (KITS) ×3 IMPLANT
MANIFOLD NEPTUNE II (INSTRUMENTS) ×3 IMPLANT
NEEDLE 22X1 1/2 (OR ONLY) (NEEDLE) ×2 IMPLANT
NS IRRIG 1000ML POUR BTL (IV SOLUTION) ×3 IMPLANT
PACK TOTAL JOINT (CUSTOM PROCEDURE TRAY) ×3 IMPLANT
PAD ARMBOARD 7.5X6 YLW CONV (MISCELLANEOUS) ×6 IMPLANT
PAD CAST 4YDX4 CTTN HI CHSV (CAST SUPPLIES) ×1 IMPLANT
PADDING CAST COTTON 4X4 STRL (CAST SUPPLIES) ×3
PADDING CAST COTTON 6X4 STRL (CAST SUPPLIES) ×3 IMPLANT
RUBBERBAND STERILE (MISCELLANEOUS) ×2 IMPLANT
SET HNDPC FAN SPRY TIP SCT (DISPOSABLE) ×1 IMPLANT
SPONGE GAUZE 4X4 12PLY (GAUZE/BANDAGES/DRESSINGS) ×3 IMPLANT
STAPLER VISISTAT 35W (STAPLE) ×3 IMPLANT
SUCTION FRAZIER TIP 10 FR DISP (SUCTIONS) ×3 IMPLANT
SUT BONE WAX W31G (SUTURE) ×3 IMPLANT
SUT ETHIBOND NAB CT1 #1 30IN (SUTURE) ×9 IMPLANT
SUT MNCRL AB 3-0 PS2 18 (SUTURE) ×3 IMPLANT
SUT VIC AB 0 CT1 27 (SUTURE) ×3
SUT VIC AB 0 CT1 27XBRD ANBCTR (SUTURE) ×1 IMPLANT
SUT VIC AB 1 CT1 27 (SUTURE) ×3
SUT VIC AB 1 CT1 27XBRD ANBCTR (SUTURE) ×1 IMPLANT
SYR CONTROL 10ML LL (SYRINGE) ×2 IMPLANT
TOWEL OR 17X24 6PK STRL BLUE (TOWEL DISPOSABLE) ×3 IMPLANT
TOWEL OR 17X26 10 PK STRL BLUE (TOWEL DISPOSABLE) ×3 IMPLANT
TRAY FOLEY CATH 16FR SILVER (SET/KITS/TRAYS/PACK) ×2 IMPLANT
TRAY FOLEY CATH 16FRSI W/METER (SET/KITS/TRAYS/PACK) IMPLANT
WATER STERILE IRR 1000ML POUR (IV SOLUTION) ×2 IMPLANT
WRAP KNEE MAXI GEL POST OP (GAUZE/BANDAGES/DRESSINGS) ×3 IMPLANT

## 2013-06-14 NOTE — Progress Notes (Signed)
Utilization review completed.  

## 2013-06-14 NOTE — H&P (Signed)
  The recent History & Physical has been reviewed. I have personally examined the patient today. There is no interval change to the documented History & Physical. The patient would like to proceed with the procedure.  Joni Fears W 06/14/2013,  9:16 AM

## 2013-06-14 NOTE — Transfer of Care (Signed)
Immediate Anesthesia Transfer of Care Note  Patient: Jerry Cain  Procedure(s) Performed: Procedure(s): TOTAL KNEE ARTHROPLASTY (Left)  Patient Location: PACU  Anesthesia Type:General  Level of Consciousness: awake, oriented and patient cooperative  Airway & Oxygen Therapy: Patient Spontanous Breathing and Patient connected to nasal cannula oxygen  Post-op Assessment: Report given to PACU RN and Post -op Vital signs reviewed and stable  Post vital signs: Reviewed  Complications: No apparent anesthesia complications

## 2013-06-14 NOTE — Op Note (Signed)
PATIENT ID:      EVANN ERAZO  MRN:     [id number] DOB/AGE:    [birth year] / 78 y.o.       OPERATIVE REPORT    DATE OF PROCEDURE:  06/14/2013       PREOPERATIVE DIAGNOSIS:   LEFT KNEE OSTEOARTHRITIS-END STAGE                                                       Estimated body mass index is 27.15 kg/(m^2) as calculated from the following:   Height as of 06/08/13: 6' (1.829 m).   Weight as of 05/13/13: 90.81 kg (200 lb 3.2 oz).     POSTOPERATIVE DIAGNOSIS:   LEFT KNEE OSTEOARTHRITIS-END STAGE                                                                     Estimated body mass index is 27.15 kg/(m^2) as calculated from the following:   Height as of 06/08/13: 6' (1.829 m).   Weight as of 05/13/13: 90.81 kg (200 lb 3.2 oz).     PROCEDURE:  Procedure(s): TOTAL KNEE ARTHROPLASTY left     SURGEON:  Joni Fears, MD    ASSISTANT:   Biagio Borg, PA-C   (Present and scrubbed throughout the case, critical for assistance with exposure, retraction, instrumentation, and closure.)          ANESTHESIA: regional and general     DRAINS: (left knee) Hemovact drain(s) in the clamped with  Suction Clamped :      TOURNIQUET TIME:  Total Tourniquet Time Documented: Thigh (Left) - 76 minutes Total: Thigh (Left) - 76 minutes     COMPLICATIONS:  None   CONDITION:  stable  PROCEDURE IN DETAIL: [id number]    Mavi Un W 06/14/2013, 12:03 PM

## 2013-06-14 NOTE — Progress Notes (Signed)
Orthopedic Tech Progress Note Patient Details:  Jerry Cain [birth year] [id number] CPM applied to Left LE with appropriate settings. OHF applied to bed. CPM Left Knee CPM Left Knee: On Left Knee Flexion (Degrees): 60 Left Knee Extension (Degrees): 0   Asia R Thompson 06/14/2013, 1:07 PM

## 2013-06-14 NOTE — Anesthesia Procedure Notes (Addendum)
Anesthesia Regional Block:  Femoral nerve block  Pre-Anesthetic Checklist: ,, timeout performed, Correct Patient, Correct Site, Correct Laterality, Correct Procedure, Correct Position, site marked, Risks and benefits discussed,  Surgical consent,  Pre-op evaluation,  At surgeon's request and post-op pain management  Laterality: Left  Prep: chloraprep       Needles:  Injection technique: Single-shot  Needle Type: Stimiplex          Additional Needles:  Procedures: ultrasound guided (picture in chart) Femoral nerve block Narrative:  Start time: 06/14/2013 9:22 AM End time: 06/14/2013 9:38 AM Injection made incrementally with aspirations every 5 mL.  Performed by: Personally   Additional Notes: Risks, benefits and alternative to block explained extensively.  Patient tolerated procedure well, without complications.   Procedure Name: LMA Insertion Date/Time: 06/14/2013 10:18 AM Performed by: Luciana Axe K Pre-anesthesia Checklist: Patient identified, Emergency Drugs available, Suction available, Patient being monitored and Timeout performed Patient Re-evaluated:Patient Re-evaluated prior to inductionOxygen Delivery Method: Circle system utilized Preoxygenation: Pre-oxygenation with 100% oxygen Intubation Type: IV induction Ventilation: Mask ventilation without difficulty LMA: LMA inserted LMA Size: 5.0 Number of attempts: 1 Placement Confirmation: positive ETCO2,  CO2 detector and breath sounds checked- equal and bilateral Tube secured with: Tape Dental Injury: Teeth and Oropharynx as per pre-operative assessment

## 2013-06-14 NOTE — Progress Notes (Signed)
Great toe has red streaks. Had surgery on that toe dec 11th. Will f/u with dr Durward Fortes

## 2013-06-14 NOTE — Anesthesia Preprocedure Evaluation (Addendum)
Anesthesia Evaluation  Patient identified by MRN, date of birth, ID band Patient awake    Reviewed: Allergy & Precautions, H&P , Patient's Chart, lab work & pertinent test results, reviewed documented beta blocker date and time   History of Anesthesia Complications (+) history of anesthetic complications  Airway Mallampati: II TM Distance: >3 FB Neck ROM: full    Dental  (+) Teeth Intact, Missing, Dental Advisory Given   Pulmonary pneumonia -,  breath sounds clear to auscultation        Cardiovascular Exercise Tolerance: Good hypertension, Pt. on medications and Pt. on home beta blockers + CAD Dysrhythmias: history afib. DC cardioversion  2013. Atrial Fibrillation Rhythm:regular Rate:Normal     Neuro/Psych Anxiety negative psych ROS   GI/Hepatic Neg liver ROS, GERD-  Controlled,  Endo/Other  Hypothyroidism   Renal/GU negative Renal ROS     Musculoskeletal   Abdominal   Peds  Hematology   Anesthesia Other Findings   Reproductive/Obstetrics                         Anesthesia Physical Anesthesia Plan  ASA: III  Anesthesia Plan: General LMA and Regional   Post-op Pain Management:    Induction:   Airway Management Planned:   Additional Equipment:   Intra-op Plan:   Post-operative Plan:   Informed Consent: I have reviewed the patients History and Physical, chart, labs and discussed the procedure including the risks, benefits and alternatives for the proposed anesthesia with the patient or authorized representative who has indicated his/her understanding and acceptance.   Dental Advisory Given  Plan Discussed with: CRNA, Surgeon and Anesthesiologist  Anesthesia Plan Comments:         Anesthesia Quick Evaluation

## 2013-06-14 NOTE — Progress Notes (Signed)
ANTICOAGULATION CONSULT NOTE - Initial Consult  Pharmacy Consult for Coumadin Indication: h/o afib (on chronic coumadin)   VTE prophylaxis  Allergies  Allergen Reactions  . Penicillins Other (See Comments)    Does not know     Patient Measurements: Height: 6' (182.9 cm) (from preadmit visit on 06/08/13) Weight: 200 lb 6.4 oz (90.901 kg) (from preadmit visit on 06/08/13) IBW/kg (Calculated) : 77.6 kg   Vital Signs: Temp: 97.6 F (36.4 C) (03/10 1330) BP: 131/74 mmHg (03/10 1330) Pulse Rate: 65 (03/10 1330)   Labs:   Baseline labs from 06/08/13:  LFTs wnl, Albumin wnl 3.7, H/H 12.7/37.1 and pltc 164K, WBC 6.2, SCR 0.92   Recent Labs  06/14/13 0841  LABPROT 15.2  INR 1.23    Estimated Creatinine Clearance: 71.5 ml/min (by C-G formula based on Cr of 0.92).   Medical History: Past Medical History  Diagnosis Date  . Hypercholesterolemia     takes Atorvastatin daily  . Gout   . BPH (benign prostatic hyperplasia)     takes Proscar daily  . CAD (coronary artery disease) 2009    cardiac cath and PTCA by Dr. Tami Ribas  . Tubular adenoma 2001  . Diverticulosis 2007    tcs by Dr. Laural Golden  . GERD (gastroesophageal reflux disease)   . Adenomatous colon polyp 2001  . Osteoarthritis     left knee  . Complication of anesthesia     hard to wake up  . Hypertension     takes Imdur,Coreg,and Lisinopril daily  . Atrial fibrillation     takes Coumadin daily  . Pneumonia     many,many yrs ago  . Numbness     right hand pointer and middle finger  . Carpal tunnel syndrome     right  . Joint pain   . Joint swelling   . Nocturia   . Hypothyroidism     takes SYnthroid daily  . Anxiety     Medications:  Prescriptions prior to admission  Medication Sig Dispense Refill  . aspirin 81 MG tablet Take 81 mg by mouth every morning.       Marland Kitchen atorvastatin (LIPITOR) 40 MG tablet Take 1 tablet (40 mg total) by mouth daily.  30 tablet  5  . Calcium Carbonate-Vitamin D (CALCIUM + D)  600-200 MG-UNIT TABS Take 1 tablet by mouth 2 (two) times daily.       . carvedilol (COREG) 6.25 MG tablet Take 1 tablet (6.25 mg total) by mouth 2 (two) times daily.  60 tablet  11  . finasteride (PROSCAR) 5 MG tablet Take 5 mg by mouth every morning.       . Glucosamine HCl (GLUCOSAMINE PO) Take 2 tablets by mouth every evening.       . isosorbide mononitrate (IMDUR) 30 MG 24 hr tablet Take 30 mg by mouth every morning.      Marland Kitchen levothyroxine (SYNTHROID, LEVOTHROID) 88 MCG tablet Take 88 mcg by mouth every morning.      Marland Kitchen lisinopril (PRINIVIL,ZESTRIL) 20 MG tablet Take 20 mg by mouth at bedtime.      . Multiple Vitamin (MULTIVITAMIN) capsule Take 1 capsule by mouth every morning.       . nitroGLYCERIN (NITROSTAT) 0.4 MG SL tablet Place 1 tablet (0.4 mg total) under the tongue every 5 (five) minutes as needed.  25 tablet  2  . psyllium (METAMUCIL) 58.6 % powder Take 1 packet by mouth daily. (3 teaspoonsful once daily at bedtime)      .  tamsulosin (FLOMAX) 0.4 MG CAPS capsule Take 0.4 mg by mouth at bedtime.       Marland Kitchen warfarin (COUMADIN) 5 MG tablet Take 2.5-5 mg by mouth daily. 2.5mg  on Monday and Friday       Scheduled:  . acetaminophen  1,000 mg Intravenous 4 times per day  . aspirin  81 mg Oral q morning - 10a  . carvedilol  6.25 mg Oral BID  . docusate sodium  100 mg Oral BID  . [START ON 06/15/2013] enoxaparin (LOVENOX) injection  30 mg Subcutaneous Q12H  . finasteride  5 mg Oral q morning - 10a  . isosorbide mononitrate  30 mg Oral q morning - 10a  . ketorolac  15 mg Intravenous 4 times per day  . levothyroxine  88 mcg Oral q morning - 10a  . lisinopril  20 mg Oral QHS  . tamsulosin  0.4 mg Oral QHS  . vancomycin  1,000 mg Intravenous Q12H   Infusions:  . sodium chloride      Assessment: 80 y.o male with left knee osteoarthritis- end stage. He is s/p Left TKA today. He is on chronic coumadin for h/o afib. PTA coumadin dose 5mg  daily except 2.5 mg qMon and Friday, last taken past  week / held  5 days prior to surgery per cardiology recommendation.  Today the INR = 1.23.  Baseline labs from 06/08/13:  LFTs wnl, Albumin wnl 3.7, H/H 12.7/37.1 and pltc 164K, WBC 6.2, SCR 0.92  Lovenox 30mg  SQ q12h for VTE prophylaxis until INR = or > 1.8  No bleeding reported post op and no h/o bleeding complications noted prior to admit.   Goal of Therapy:  INR 2-3 Monitor platelets by anticoagulation protocol: Yes   Plan:  Coumadin 7.5 mg po today x1 Daily PT/INR  Nicole Cella, RPh Clinical Pharmacist Pager: [phone] 06/14/2013,2:16 PM

## 2013-06-14 NOTE — Anesthesia Postprocedure Evaluation (Signed)
  Anesthesia Post Note  Patient: DHAIRYA CORALES  Procedure(s) Performed: Procedure(s) (LRB): TOTAL KNEE ARTHROPLASTY (Left)  Anesthesia type: GA  Patient location: PACU  Post pain: Pain level controlled  Post assessment: Post-op Vital signs reviewed  Last Vitals:  Filed Vitals:   06/14/13 0945  BP: 132/54  Pulse: 57  Temp:   Resp: 16    Post vital signs: Reviewed  Level of consciousness: sedated  Complications: No apparent anesthesia complications

## 2013-06-14 NOTE — Preoperative (Signed)
Beta Blockers   Reason not to administer Beta Blockers:Not Applicable 

## 2013-06-14 NOTE — Evaluation (Signed)
Physical Therapy Evaluation Patient Details Name: Jerry Cain MRN: [id number] DOB: [birth year] Today's Date: 06/14/2013 Time: 9562-1308 PT Time Calculation (min): 32 min  PT Assessment / Plan / Recommendation History of Present Illness  L TKA  Clinical Impression  Pt is s/p TKA resulting in the deficits listed below (see PT Problem List).  Pt will benefit from skilled PT to increase their independence and safety with mobility to allow discharge to the venue listed below.      PT Assessment  Patient needs continued PT services    Follow Up Recommendations  Home health PT;Supervision/Assistance - 24 hour    Does the patient have the potential to tolerate intense rehabilitation      Barriers to Discharge        Equipment Recommendations  Rolling walker with 5" wheels;3in1 (PT)    Recommendations for Other Services OT consult   Frequency 7X/week    Precautions / Restrictions Precautions Precautions: Fall;Knee Precaution Comments: Educated in no pillow under knee Restrictions Weight Bearing Restrictions: Yes LLE Weight Bearing: Partial weight bearing LLE Partial Weight Bearing Percentage or Pounds: 50%   Pertinent Vitals/Pain Initially reporting min to no pain; End of session, pt reports pain in back of knee while propped for extension; did not rate patient repositioned for comfort and Optimal knee extension       Mobility  Bed Mobility Overal bed mobility: Needs Assistance Bed Mobility: Supine to Sit Supine to sit: Min assist General bed mobility comments: Cues for technique; min assist for LLE Transfers Overall transfer level: Needs assistance Equipment used: Rolling walker (2 wheeled) Transfers: Sit to/from Stand Sit to Stand: Mod assist General transfer comment: Cues for technqiue and safe hand placement Ambulation/Gait Ambulation/Gait assistance: Mod assist Ambulation Distance (Feet): 3 Feet (pivot steps bed to chair) Assistive device: Rolling walker  (2 wheeled) Gait Pattern/deviations: Step-to pattern General Gait Details: Cues for gait sequence and to activate L quad for stance stability; Significant knee buckling noted, requiring hands-on guard for safety    Exercises Total Joint Exercises Ankle Circles/Pumps: AROM;Both;10 reps Quad Sets: AROM;Left;5 reps Heel Slides: AAROM;Left;5 reps Straight Leg Raises: AAROM;Left;5 reps   PT Diagnosis: Difficulty walking;Acute pain  PT Problem List: Decreased strength;Decreased range of motion;Decreased activity tolerance;Decreased balance;Decreased mobility;Decreased knowledge of use of DME;Pain PT Treatment Interventions: DME instruction;Gait training;Functional mobility training;Therapeutic activities;Therapeutic exercise;Patient/family education     PT Goals(Current goals can be found in the care plan section) Acute Rehab PT Goals Patient Stated Goal: back to his sawmill PT Goal Formulation: With patient Time For Goal Achievement: 06/21/13 Potential to Achieve Goals: Good  Visit Information  Last PT Received On: 06/14/13 Assistance Needed: +2 (push chair) History of Present Illness: L TKA       Prior [place] expects to be discharged to:: Private residence Living Arrangements: Spouse/significant other Available Help at Discharge: Family;Available 24 hours/day Type of Home: House Home Access: Ramped entrance Home Layout: One level Home Equipment: None (Need to verify this) Prior Function Level of Independence: Independent Communication Communication: No difficulties    Cognition  Cognition Arousal/Alertness: Awake/alert Behavior During Therapy: WFL for tasks assessed/performed Overall Cognitive Status: Within Functional Limits for tasks assessed    Extremity/Trunk Assessment Upper Extremity Assessment Upper Extremity Assessment: Overall WFL for tasks assessed Lower Extremity Assessment Lower Extremity Assessment: LLE deficits/detail LLE  Deficits / Details: Noted decr strength, decr knee control postop   Balance    End of Session PT - End of Session Equipment Utilized During  Treatment: Gait belt Activity Tolerance: Patient tolerated treatment well Patient left: in chair;with call bell/phone within reach;with family/visitor present Nurse Communication: Mobility status   GP     [name] 06/14/2013, 4:20 PM  Roney Marion, Elizabeth Pager [phone] Office [phone]

## 2013-06-15 LAB — BASIC METABOLIC PANEL
BUN: 18 mg/dL (ref 6–23)
CHLORIDE: 105 meq/L (ref 96–112)
CO2: 22 mEq/L (ref 19–32)
Calcium: 8.2 mg/dL — ABNORMAL LOW (ref 8.4–10.5)
Creatinine, Ser: 0.8 mg/dL (ref 0.50–1.35)
GFR calc Af Amer: >90 mL/min (ref >90)
GFR, EST NON AFRICAN AMERICAN: 83 mL/min — AB (ref >90)
GLUCOSE: 166 mg/dL — AB (ref 70–99)
POTASSIUM: 4 meq/L (ref 3.7–5.3)
Sodium: 139 mEq/L (ref 137–147)

## 2013-06-15 LAB — PROTIME-INR
INR: 1.4 (ref 0.00–1.49)
Prothrombin Time: 16.8 seconds — ABNORMAL HIGH (ref 11.6–15.2)

## 2013-06-15 LAB — CBC
HCT: 29 % — ABNORMAL LOW (ref 39.0–52.0)
Hemoglobin: 10 g/dL — ABNORMAL LOW (ref 13.0–17.0)
MCH: 31 pg (ref 26.0–34.0)
MCHC: 34.5 g/dL (ref 30.0–36.0)
MCV: 89.8 fL (ref 78.0–100.0)
Platelets: 146 10*3/uL — ABNORMAL LOW (ref 150–400)
RBC: 3.23 MIL/uL — ABNORMAL LOW (ref 4.22–5.81)
RDW: 14.5 % (ref 11.5–15.5)
WBC: 11 10*3/uL — AB (ref 4.0–10.5)

## 2013-06-15 LAB — GLUCOSE, CAPILLARY: GLUCOSE-CAPILLARY: 134 mg/dL — AB (ref 70–99)

## 2013-06-15 MED ORDER — WARFARIN SODIUM 5 MG PO TABS
5.0000 mg | ORAL_TABLET | Freq: Once | ORAL | Status: AC
  Administered 2013-06-15: 5 mg via ORAL
  Filled 2013-06-15: qty 1

## 2013-06-15 NOTE — Progress Notes (Signed)
ANTICOAGULATION CONSULT NOTE - Initial Consult  Pharmacy Consult for Coumadin Indication: h/o afib (on chronic coumadin)   VTE prophylaxis  Allergies  Allergen Reactions  . Penicillins Other (See Comments)    Does not know     Patient Measurements: Height: 6' (182.9 cm) (from preadmit visit on 06/08/13) Weight: 200 lb 6.4 oz (90.901 kg) (from preadmit visit on 06/08/13) IBW/kg (Calculated) : 77.6 kg   Vital Signs: Temp: 98.2 F (36.8 C) (03/11 0658) Temp src: Oral (03/11 0658) BP: 117/54 mmHg (03/11 0946) Pulse Rate: 69 (03/11 0946)   Labs:   Baseline labs from 06/08/13:  LFTs wnl, Albumin wnl 3.7, H/H 12.7/37.1 and pltc 164K, WBC 6.2, SCR 0.92   Recent Labs  06/14/13 0841 06/14/13 1505 06/15/13 0500  HGB  --  12.4* 10.0*  HCT  --  36.5* 29.0*  PLT  --  132* 146*  LABPROT 15.2  --  16.8*  INR 1.23  --  1.40  CREATININE  --  0.80 0.80    Estimated Creatinine Clearance: 82.2 ml/min (by C-G formula based on Cr of 0.8).   Medical History: Past Medical History  Diagnosis Date  . Hypercholesterolemia     takes Atorvastatin daily  . Gout   . BPH (benign prostatic hyperplasia)     takes Proscar daily  . CAD (coronary artery disease) 2009    cardiac cath and PTCA by Dr. Tami Ribas  . Tubular adenoma 2001  . Diverticulosis 2007    tcs by Dr. Laural Golden  . GERD (gastroesophageal reflux disease)   . Adenomatous colon polyp 2001  . Osteoarthritis     left knee  . Complication of anesthesia     hard to wake up  . Hypertension     takes Imdur,Coreg,and Lisinopril daily  . Atrial fibrillation     takes Coumadin daily  . Pneumonia     many,many yrs ago  . Numbness     right hand pointer and middle finger  . Carpal tunnel syndrome     right  . Joint pain   . Joint swelling   . Nocturia   . Hypothyroidism     takes SYnthroid daily  . Anxiety     Medications:  Prescriptions prior to admission  Medication Sig Dispense Refill  . aspirin 81 MG tablet Take 81 mg by  mouth every morning.       Marland Kitchen atorvastatin (LIPITOR) 40 MG tablet Take 1 tablet (40 mg total) by mouth daily.  30 tablet  5  . Calcium Carbonate-Vitamin D (CALCIUM + D) 600-200 MG-UNIT TABS Take 1 tablet by mouth 2 (two) times daily.       . carvedilol (COREG) 6.25 MG tablet Take 1 tablet (6.25 mg total) by mouth 2 (two) times daily.  60 tablet  11  . finasteride (PROSCAR) 5 MG tablet Take 5 mg by mouth every morning.       . Glucosamine HCl (GLUCOSAMINE PO) Take 2 tablets by mouth every evening.       . isosorbide mononitrate (IMDUR) 30 MG 24 hr tablet Take 30 mg by mouth every morning.      Marland Kitchen levothyroxine (SYNTHROID, LEVOTHROID) 88 MCG tablet Take 88 mcg by mouth every morning.      Marland Kitchen lisinopril (PRINIVIL,ZESTRIL) 20 MG tablet Take 20 mg by mouth at bedtime.      . Multiple Vitamin (MULTIVITAMIN) capsule Take 1 capsule by mouth every morning.       . nitroGLYCERIN (NITROSTAT) 0.4 MG SL  tablet Place 1 tablet (0.4 mg total) under the tongue every 5 (five) minutes as needed.  25 tablet  2  . psyllium (METAMUCIL) 58.6 % powder Take 1 packet by mouth daily. (3 teaspoonsful once daily at bedtime)      . tamsulosin (FLOMAX) 0.4 MG CAPS capsule Take 0.4 mg by mouth at bedtime.       Marland Kitchen warfarin (COUMADIN) 5 MG tablet Take 2.5-5 mg by mouth daily. 2.5mg  on Monday and Friday       Scheduled:  . acetaminophen  1,000 mg Intravenous 4 times per day  . aspirin EC  81 mg Oral q morning - 10a  . carvedilol  6.25 mg Oral BID  . docusate sodium  100 mg Oral BID  . enoxaparin (LOVENOX) injection  30 mg Subcutaneous Q12H  . finasteride  5 mg Oral q morning - 10a  . isosorbide mononitrate  30 mg Oral q morning - 10a  . levothyroxine  88 mcg Oral q morning - 10a  . lisinopril  20 mg Oral QHS  . tamsulosin  0.4 mg Oral QHS  . Warfarin - Pharmacist Dosing Inpatient   Does not apply q1800   Infusions:  . sodium chloride Stopped (06/15/13 0831)    Assessment: INR = 1.4 today. This is a 80  Y.o male POD#1 s/p  LTKA today. He is on chronic coumadin for h/o afib. PTA coumadin dose 5mg  daily except 2.5 mg qMon and Friday, last taken past week / held  5 days prior to surgery per cardiology recommendation.    Baseline labs from 06/08/13:  LFTs wnl, Albumin wnl 3.7.  Acute Blood Loss Anemia-asymptomatic:  H/H  10.0/29.0 from 12.7/37.1 and pltc 146 from 164K.  Currently on Lovenox 30mg  SQ q12h for VTE prophylaxis until INR = or > 1.8. No bleeding reported. Ortho MD notes today negative for CP/SOB/calf pain.  Goal of Therapy:  INR 2-3 Monitor platelets by anticoagulation protocol: Yes   Plan:  Coumadin 5 mg po today x1 Daily PT/INR  Nicole Cella, RPh Clinical Pharmacist Pager: [phone] 06/15/2013,9:58 AM

## 2013-06-15 NOTE — Progress Notes (Signed)
[name], male   DOB: [birth year], 80 y.o.   MRN: [id number] [name]        MRN:  [id number]          DOB/AGE: Sep 23, 1931 / 80 y.o.    Jerry Fears, MD   Biagio Borg, PA-C [address] [place], [place]  [address]                             [phone]   PROGRESS NOTE  Subjective:  negative for Chest Pain  negative for Shortness of Breath  negative for Nausea/Vomiting   negative for Calf Pain    Tolerating Diet: yes         Patient reports pain as mild.     Pt does not feel that the femoral nerve block has completely "worn off"-minimal pain  Objective: Vital signs in last 24 hours:   Patient Vitals for the past 24 hrs:  BP Temp Temp src Pulse Resp SpO2 Height Weight  06/15/13 0658 123/45 mmHg 98.2 F (36.8 C) Oral 66 18 100 % - -  06/15/13 0400 - - - - 18 100 % - -  06/15/13 0000 - - - - 16 100 % - -  06/14/13 2247 115/58 mmHg 97.6 F (36.4 C) Oral 72 16 93 % - -  06/14/13 2242 115/58 mmHg - - 72 - - - -  06/14/13 2000 - - - - 16 93 % - -  06/14/13 1600 - - - - 16 - - -  06/14/13 1535 139/64 mmHg 97.4 F (36.3 C) - 68 - 100 % - -  06/14/13 1330 131/74 mmHg 97.6 F (36.4 C) - 65 15 98 % 6' (1.829 m) 90.901 kg (200 lb 6.4 oz)  06/14/13 1300 141/76 mmHg - - 39 17 96 % - -  06/14/13 1245 136/70 mmHg - - 30 16 96 % - -  06/14/13 1234 145/67 mmHg 98.7 F (37.1 C) - - - 95 % - -  06/14/13 0945 132/54 mmHg - - 57 16 100 % - -  06/14/13 0940 120/51 mmHg - - 55 - 100 % - -  06/14/13 0938 125/57 mmHg - - 52 - - - -  06/14/13 0935 - - - 51 16 99 % - -  06/14/13 0845 114/52 mmHg 97.9 F (36.6 C) - 56 20 98 % - -      Intake/Output from previous day:   03/10 0701 - 03/11 0700 In: 3020 [P.O.:920; I.V.:1800] Out: 2800 [Urine:2350; Drains:450]   Intake/Output this shift:       Intake/Output     03/10 0701 - 03/11 0700 03/11 0701 - 03/12 0700   P.O. 920    I.V. (mL/kg) 1800 (19.8)    Other 300    Total Intake(mL/kg)  3020 (33.2)    Urine (mL/kg/hr) 2350    Drains 450    Total Output 2800     Net +220             LABORATORY DATA:  Recent Labs  06/08/13 1046 06/14/13 1505 06/15/13 0500  WBC 6.2 6.7 11.0*  HGB 12.7* 12.4* 10.0*  HCT 37.1* 36.5* 29.0*  PLT 164 132* 146*    Recent Labs  06/08/13 1046 06/14/13 1505 06/15/13 0500  NA 142  --  139  K 4.6  --  4.0  CL 105  --  105  CO2 26  --  22  BUN 19  --  18  CREATININE 0.92 0.80 0.80  GLUCOSE 91  --  166*  CALCIUM 9.0  --  8.2*   Lab Results  Component Value Date   INR 1.40 06/15/2013   INR 1.23 06/14/2013   INR 2.06* 06/08/2013    Recent Radiographic Studies :  No results found.   Examination:  General appearance: alert, cooperative and no distress  Wound Exam: clean, dry, intact   Drainage:  None: wound tissue dry-hemovac drainage about 100cc's in past shift  Motor Exam: EHL, FHL, Anterior Tibial and Posterior Tibial Intact  Sensory Exam: Superficial Peroneal, Deep Peroneal and Tibial normal  Vascular Exam: Normal  Assessment:    1 Day Post-Op  Procedure(s) (LRB): TOTAL KNEE ARTHROPLASTY (Left)  ADDITIONAL DIAGNOSIS:  Active Problems:   S/P total knee replacement using cement  Acute Blood Loss Anemia-asymptomatic   Plan: Physical Therapy as ordered Partial Weight Bearing @ 50% (PWB)  DVT Prophylaxis:  Lovenox and Coumadin  DISCHARGE PLAN: Home  DISCHARGE NEEDS: HHPT, CPM, Walker and 3-in-1 comode seat  OOB with PT, catheter out, monitor pain, saline lock IV       Garald Balding 06/15/2013 7:42 AM

## 2013-06-15 NOTE — Care Management Note (Signed)
CARE MANAGEMENT NOTE 06/15/2013  Patient:  Jerry Cain, Jerry Cain   Account Number:  [id number]  Date Initiated:  06/15/2013  Documentation initiated by:  Ricki Miller  Subjective/Objective Assessment:   80 yr old male s/p left total knee arthroplasty.     Action/Plan:   Case manager spoke with patient and  daughter concerning home health and DME needs at discharge. choice offered. Referral called to [facility].patient has DME.   Anticipated DC Date:  06/16/2013   Anticipated DC Plan:  [place]  CM consult      [facility] Choice  HOME HEALTH  DURABLE MEDICAL EQUIPMENT   Choice offered to / List presented to:  C-1 Patient   DME arranged  3-N-1  [place]  CPM      DME agency  [employer]     [place] arranged  HH-2 PT      [place].   Status of service:  Completed, signed off Medicare Important Message given?   (If response is "NO", the following Medicare IM given date fields will be blank) Date Medicare IM given:   Date Additional Medicare IM given:    Discharge Disposition:  [place]

## 2013-06-15 NOTE — Progress Notes (Signed)
Physical Therapy Treatment Patient Details Name: Jerry Cain MRN: [id number] DOB: 02-Oct-1931 Today's Date: 06/15/2013 Time: 7741-2878 PT Time Calculation (min): 30 min  PT Assessment / Plan / Recommendation  History of Present Illness Jerry Cain   PT Comments   Improving knee control with cues and concentration on quad activation in stance; Pt very motivated and should do well  Follow Up Recommendations  Home health PT;Supervision/Assistance - 24 hour     Does the patient have the potential to tolerate intense rehabilitation     Barriers to Discharge        Equipment Recommendations  Rolling walker with 5" wheels;3in1 (PT)    Recommendations for Other Services    Frequency 7X/week   Progress towards PT Goals Progress towards PT goals: Progressing toward goals  Plan Current plan remains appropriate    Precautions / Restrictions Precautions Precautions: Fall;Knee Precaution Comments: Educated in no pillow under knee Restrictions LLE Weight Bearing: Partial weight bearing LLE Partial Weight Bearing Percentage or Pounds: 50   Pertinent Vitals/Pain 1/10 during amb 0.5/10 after session sitting with heel propped    Mobility  Bed Mobility Overal bed mobility: Needs Assistance Bed Mobility: Supine to Sit Supine to sit: Min guard General bed mobility comments: Cues for technique; Smooth transition Transfers Overall transfer level: Needs assistance Equipment used: Rolling walker (2 wheeled) Transfers: Sit to/from Stand Sit to Stand: Min assist General transfer comment: Cues for technqiue and safe hand placement; need for very close guard at initial stand due to Jerry knee buckling Ambulation/Gait Ambulation/Gait assistance: +2 safety/equipment;Mod assist;Min assist Ambulation Distance (Feet): 70 Feet Assistive device: Rolling walker (2 wheeled) Gait Pattern/deviations: Step-through pattern Gait velocity: slowed General Gait Details: Cues for gait sequence and to activate Jerry  quad for stance stability; Significant knee buckling noted, requiring hands-on guard for safety; knee buckling improved with verbal and tactile cues for "squeezing" the knee with thigh muscle    Exercises Total Joint Exercises Goniometric ROM: 0-99deg   PT Diagnosis:    PT Problem List:   PT Treatment Interventions:     PT Goals (current goals can now be found in the care plan section) Acute Rehab PT Goals Patient Stated Goal: back to his sawmill PT Goal Formulation: With patient Time For Goal Achievement: 06/21/13 Potential to Achieve Goals: Good  Visit Information  Last PT Received On: 06/15/13 Assistance Needed: +2 (hopefully +1 soon) History of Present Illness: Jerry Cain    Subjective Data  Subjective: Reports has been practicing Quad setting LLE Patient Stated Goal: back to his sawmill   Solicitor Arousal/Alertness: Awake/alert Behavior During Therapy: WFL for tasks assessed/performed Overall Cognitive Status: Within Functional Limits for tasks assessed    Balance     End of Session PT - End of Session Equipment Utilized During Treatment: Gait belt Activity Tolerance: Patient tolerated treatment well Patient left: in chair;with call bell/phone within reach;with family/visitor present Nurse Communication: Mobility status CPM Left Knee CPM Left Knee: On Left Knee Flexion (Degrees): 70 Left Knee Extension (Degrees): 0   GP     [name] 06/15/2013, 10:56 AM  Roney Marion, [place] Pager [phone] Office [phone]

## 2013-06-15 NOTE — Op Note (Signed)
Jerry Cain, Jerry Cain NO.:  [id number]  MEDICAL RECORD NO.:  [id number]  LOCATION:  [id number]                        FACILITY:  [facility]  PHYSICIAN:  Vonna Kotyk. Luka Stohr, M.D.DATE OF BIRTH:  Sep 18, 1931  DATE OF PROCEDURE:  06/14/2013 DATE OF DISCHARGE:                              OPERATIVE REPORT   PREOPERATIVE DIAGNOSIS:  End-stage osteoarthritis, left knee.  POSTOPERATIVE DIAGNOSIS:  End-stage osteoarthritis, left knee.  PROCEDURE:  Left total knee replacement.  SURGEON:  Vonna Kotyk. Durward Fortes, MD  ASSISTANT:  Biagio Borg PA-C, who was present throughout the operative procedure to ensure its timely completion.  ANESTHESIA:  General with supplemental left femoral nerve block.  COMPLICATIONS:  None.  COMPONENTS:  DePuy LCS standard plus femoral component, a #5 rotating keeled tibial tray, a 12.5 mm polyethylene bridging bearing, and a 3 PEG metal back rotating patella.  All components were secured with polymethyl methacrylate.  DESCRIPTION OF PROCEDURE:  Mr. Longest was met with his wife in the holding area, identified the left knee as the proper operative site, marked accordingly, anesthesia performed a left femoral nerve block.  The patient was then transported to room #7 and placed under general orotracheal anesthesia without difficulty.  A Foley catheter was inserted by the nursing staff.  Urine was clear.  Tourniquet was then applied to the left thigh and the left leg was prepped with chlorhexidine scrub and then DuraPrep and the tourniquet to the tips of the toes.  Sterile draping was performed.  Time-out was called.  With the extremity elevated, it was Esmarch exsanguinated with a proximal tourniquet at 350 mmHg.  Midline longitudinal incision was made centered about the patella with extending from the superior pouch to tibial tubercle.  Via sharp dissection, incision was carried down to subcutaneous tissue.  Gross bleeders were Bovie  coagulated.  The first layer of capsule was incised in the midline.  A medial parapatellar incision was then made through the deep capsule with the Bovie.  The joint was entered.  There was a clear yellow joint effusion.  Patella was everted to 180 degrees laterally, knee flexed to 90 degrees.  There were large osteophytes along the medial lateral femoral condyles, more so medially than laterally.  There was little if any cartilage remaining along the medial femoral condyle and tibial plateau.  There was a varus position, but it was not fixed.  Osteophytes were then removed from the femur and the patella.  I sized a standard plus femoral component.  A synovectomy was performed.  There were areas of beefy red synovitis.  First, bony cut was then made transversely in the proximal tibia with a 7-degree angle of declination. The external guide was then applied and the bony cut was made without difficulty.  Subsequent cuts were then made on the femur using the standard plus femoral guide. The flexion and extension gaps were symmetrical at 12.5 mm.  Laminar spreaders were inserted along the medial lateral compartment.  I removed the ACL PCL, medial lateral menisci, and then removed osteophytes from the posterior femoral condyles.  MCL and LCL remained intact.  Final cut was then made on the femur for tapering and then for the center holes.  Retractors were carefully  placed around the tibia, it was advanced anteriorly and measured a #5 tibial tray.  We checked the alignment with the external guide as we did with each of the bony cuts on the femur and the tibia.  The center hole was then made followed by the keeled cut.  The trial tibial jig in place, the 12.5 mm bridging bearing was inserted followed by the standard plus femoral component.  The construct was then reduced through full range of motion and full extension.  The patient did have a preoperative flexion contracture which was  corrected postoperatively.  Patella was then prepared by removing approximately 11 mm of bone.  With a 14 mm patella thickness, 3 holes were then made with the patella jig. The trial patella was inserted through a full range of motion, it remained perfectly stable.  The trial components were then removed.  The joint was copiously irrigated with saline solution and further areas of synovitis were removed.  Retractors were then placed about the tibia, was advanced anteriorly. The first component was inserted with polymethyl methacrylate, i.e., the #5 tibial tray.  This was impacted in place.  Extraneous methacrylate was removed from its periphery.  The 12.5 mm bridging bearing was inserted followed by the standard plus femoral component.  The knee was placed in full extension.  We had nice fit of the components. Any further extraneous methacrylate was removed from the periphery of the components.  Patella was applied with methacrylate and a patellar clamp.  While waiting for the methacrylate to mature, we injected the deep capsule with 0.25% Marcaine with epinephrine.  At 15 minutes, the methacrylate had matured.  The patellar clamp was removed.  The joint was inspected. We did not see any further extraneous methacrylate.  The tourniquet was deflated at 76 minutes.  We had nice bleeding throughout the joint surface and skin edges.  The wound was irrigated with saline solution. Gross bleeders were Bovie coagulated.  Any bleeding bone was covered with bone wax.  Medium-sized Hemovac was then inserted.  We thought we had nice hemostasis before closure.  The deep capsule was closed with interrupted #1 Ethibond superficial capsule with running 0 Vicryl and 3-0 and subcu with 3-0 Monocryl.  Skin closed with skin clips.  Sterile bulky dressing was applied followed by the patient's support stocking.  The patient tolerated the procedure without complications.     Vonna Kotyk. Durward Fortes,  M.D.     PWW/MEDQ  D:  06/14/2013  T:  06/15/2013  Job:  [id number]

## 2013-06-15 NOTE — Plan of Care (Signed)
Problem: Consults Goal: Diagnosis- Total Joint Replacement Primary Total Knee Left     

## 2013-06-15 NOTE — Progress Notes (Signed)
Physical Therapy Treatment Patient Details Name: Jerry Cain MRN: [id number] DOB: 1931/11/09 Today's Date: 06/15/2013 Time: 2536-6440 PT Time Calculation (min): 37 min  PT Assessment / Plan / Recommendation  History of Present Illness L TKA   PT Comments   Continuing progress with mobility; Still with occasional L knee buckle leading to the need for mod assist to keep balance with amb and upright activities; Lengthy discussion with pt and family re: ability to safely dc home, and wife is concerned at her ability to care for pt; we discussed short-term rehab at SNF, and pt is agreeable   Follow Up Recommendations  Supervision/Assistance - 24 hour;SNF     Does the patient have the potential to tolerate intense rehabilitation     Barriers to Discharge        Equipment Recommendations  Rolling walker with 5" wheels;3in1 (PT)    Recommendations for Other Services OT consult  Frequency 7X/week   Progress towards PT Goals Progress towards PT goals: Progressing toward goals  Plan Discharge plan needs to be updated    Precautions / Restrictions Precautions Precautions: Fall;Knee Precaution Comments: Educated in no pillow under knee Restrictions LLE Weight Bearing: Partial weight bearing LLE Partial Weight Bearing Percentage or Pounds: 50   Pertinent Vitals/Pain Did not rate L knee pain, but does state he can "feel it more" patient repositioned for comfort and Optimal knee extension Ice applied    Mobility  Bed Mobility Overal bed mobility: Needs Assistance Bed Mobility: Supine to Sit Supine to sit: Min guard General bed mobility comments: Cues for technique; Smooth transition Transfers Overall transfer level: Needs assistance Equipment used: Rolling walker (2 wheeled) Transfers: Sit to/from Stand Sit to Stand: Min assist General transfer comment: Cues for technqiue and safe hand placement; need for very close guard at initial stand due to L knee  buckling Ambulation/Gait Ambulation/Gait assistance: Mod assist;Min assist Ambulation Distance (Feet): 70 Feet Assistive device: Rolling walker (2 wheeled) Gait Pattern/deviations: Step-through pattern (L knee buckles) Gait velocity: slowed General Gait Details: Cues for gait sequence and to activate L quad for stance stability; Significant knee buckling noted, requiring hands-on guard for safety; knee buckling improved with verbal and tactile cues for "squeezing" the knee with thigh muscle    Exercises Total Joint Exercises Quad Sets: AROM;Left;20 reps Heel Slides: AROM;Left;10 reps Straight Leg Raises: AAROM;Left;10 reps   PT Diagnosis:    PT Problem List:   PT Treatment Interventions:     PT Goals (current goals can now be found in the care plan section) Acute Rehab PT Goals Patient Stated Goal: back to his sawmill PT Goal Formulation: With patient Time For Goal Achievement: 06/21/13 Potential to Achieve Goals: Good  Visit Information  Last PT Received On: 06/15/13 Assistance Needed: +2 (hopefully +1 soon) History of Present Illness: L TKA    Subjective Data  Subjective: He is agreeable to postacute SNF Patient Stated Goal: back to his sawmill   Cognition  Cognition Arousal/Alertness: Awake/alert Behavior During Therapy: WFL for tasks assessed/performed Overall Cognitive Status: Within Functional Limits for tasks assessed    Balance  Balance Overall balance assessment: Needs assistance Standing balance support: Bilateral upper extremity supported Standing balance-Leahy Scale: Poor Standing balance comment: L knee tends to buckle; needs cues to activate quad and close guard  End of Session PT - End of Session Equipment Utilized During Treatment: Gait belt Activity Tolerance: Patient tolerated treatment well Patient left: in chair;with call bell/phone within reach;with family/visitor present Nurse Communication: Mobility status  GP     Roney Marion  Hamff 06/15/2013, 5:09 PM Roney Marion, [place] Pager [phone] Office [phone]

## 2013-06-16 ENCOUNTER — Inpatient Hospital Stay (HOSPITAL_COMMUNITY): Payer: Medicare Other

## 2013-06-16 ENCOUNTER — Encounter (HOSPITAL_COMMUNITY): Payer: Self-pay | Admitting: Orthopaedic Surgery

## 2013-06-16 DIAGNOSIS — M25469 Effusion, unspecified knee: Secondary | ICD-10-CM | POA: Diagnosis not present

## 2013-06-16 DIAGNOSIS — Z96659 Presence of unspecified artificial knee joint: Secondary | ICD-10-CM | POA: Diagnosis not present

## 2013-06-16 DIAGNOSIS — M1712 Unilateral primary osteoarthritis, left knee: Secondary | ICD-10-CM | POA: Diagnosis present

## 2013-06-16 LAB — CBC
HCT: 26.5 % — ABNORMAL LOW (ref 39.0–52.0)
Hemoglobin: 9 g/dL — ABNORMAL LOW (ref 13.0–17.0)
MCH: 30.6 pg (ref 26.0–34.0)
MCHC: 34 g/dL (ref 30.0–36.0)
MCV: 90.1 fL (ref 78.0–100.0)
PLATELETS: 128 10*3/uL — AB (ref 150–400)
RBC: 2.94 MIL/uL — ABNORMAL LOW (ref 4.22–5.81)
RDW: 15 % (ref 11.5–15.5)
WBC: 7.1 10*3/uL (ref 4.0–10.5)

## 2013-06-16 LAB — BASIC METABOLIC PANEL
BUN: 22 mg/dL (ref 6–23)
CHLORIDE: 107 meq/L (ref 96–112)
CO2: 27 mEq/L (ref 19–32)
Calcium: 8.1 mg/dL — ABNORMAL LOW (ref 8.4–10.5)
Creatinine, Ser: 0.96 mg/dL (ref 0.50–1.35)
GFR calc non Af Amer: 77 mL/min — ABNORMAL LOW (ref >90)
GFR, EST AFRICAN AMERICAN: 89 mL/min — AB (ref >90)
Glucose, Bld: 96 mg/dL (ref 70–99)
Potassium: 4.7 mEq/L (ref 3.7–5.3)
SODIUM: 143 meq/L (ref 137–147)

## 2013-06-16 LAB — PROTIME-INR
INR: 1.78 — AB (ref 0.00–1.49)
PROTHROMBIN TIME: 20.2 s — AB (ref 11.6–15.2)

## 2013-06-16 MED ORDER — WARFARIN SODIUM 5 MG PO TABS
5.0000 mg | ORAL_TABLET | Freq: Once | ORAL | Status: AC
  Administered 2013-06-16: 5 mg via ORAL
  Filled 2013-06-16: qty 1

## 2013-06-16 MED ORDER — ENOXAPARIN SODIUM 30 MG/0.3ML ~~LOC~~ SOLN: 30.0000 mg | Freq: Two times a day (BID) | SUBCUTANEOUS | Status: DC

## 2013-06-16 MED ORDER — TRAMADOL HCL 50 MG PO TABS
50.0000 mg | ORAL_TABLET | Freq: Four times a day (QID) | ORAL | Status: DC
  Administered 2013-06-16 – 2013-06-17 (×4): 50 mg via ORAL
  Filled 2013-06-16 (×5): qty 1

## 2013-06-16 MED ORDER — BISACODYL 10 MG RE SUPP: 10.0000 mg | Freq: Every day | RECTAL | Status: DC | PRN

## 2013-06-16 MED ORDER — DSS 100 MG PO CAPS: 100.0000 mg | ORAL_CAPSULE | Freq: Two times a day (BID) | ORAL | Status: DC

## 2013-06-16 MED ORDER — OXYCODONE HCL 5 MG PO TABS: 5.0000 mg | ORAL_TABLET | ORAL | Status: DC | PRN

## 2013-06-16 MED ORDER — METHOCARBAMOL 500 MG PO TABS: 500.0000 mg | ORAL_TABLET | Freq: Three times a day (TID) | ORAL | Status: DC | PRN

## 2013-06-16 NOTE — Discharge Summary (Signed)
Joni Fears, MD   Biagio Borg, PA-C [address], [place],   [address]                             [phone]  [name]        MRN:  [id number]          DOB/AGE: 1932/01/15 / 80 y.o.    DISCHARGE SUMMARY  ADMISSION DATE:    06/14/2013 DISCHARGE DATE:   06/17/2013   ADMISSION DIAGNOSIS: LEFT KNEE OSTEOARTHRITIS    DISCHARGE DIAGNOSIS:  LEFT KNEE OSTEOARTHRITIS    ADDITIONAL DIAGNOSIS: Principal Problem:   Arthritis of knee, left Active Problems:   S/P total knee replacement using cement  Past Medical History  Diagnosis Date  . Hypercholesterolemia     takes Atorvastatin daily  . Gout   . BPH (benign prostatic hyperplasia)     takes Proscar daily  . CAD (coronary artery disease) 2009    cardiac cath and PTCA by Dr. Tami Ribas  . Tubular adenoma 2001  . Diverticulosis 2007    tcs by Dr. Laural Golden  . GERD (gastroesophageal reflux disease)   . Adenomatous colon polyp 2001  . Osteoarthritis     left knee  . Complication of anesthesia     hard to wake up  . Hypertension     takes Imdur,Coreg,and Lisinopril daily  . Atrial fibrillation     takes Coumadin daily  . Pneumonia     many,many yrs ago  . Numbness     right hand pointer and middle finger  . Carpal tunnel syndrome     right  . Joint pain   . Joint swelling   . Nocturia   . Hypothyroidism     takes SYnthroid daily  . Anxiety     PROCEDURE: Procedure(s): TOTAL KNEE ARTHROPLASTY Left on 06/14/2013  CONSULTS: none     HISTORY:Mr. Brustad is a very pleasant 80 year old white male who is seen today for evaluation of his left knee. He is a patient of Dr. Melina Copa who has had trouble over about 20 years of pain and discomfort in the left knee. He is fully aware of the fact that it is arthritic in nature. He, however, does have a sawmill and is having difficulty with being able to perform work in the [employer]. He has difficulty with walking, bending, stooping, and squatting.  He cannot even ride his horse of the pain and discomfort. Difficulty with sleeping as well as getting in and out of a car. He has actually reached a point now when he tries to walk his property, he has to use an all-wheel drive vehicle just because he cannot walk anymore because of his left knee pain. He is having mechanical symptoms in the left knee. He does not remember any obvious history of injury or trauma. He has been tried with ibuprofen, glucosamine, even liniment and Biofreeze which is not very beneficial. He uses a cane, even tried a brace. He even tries to swim at the [facility], but he is having more and more difficulty with getting in and out of the pool because of his left knee.   HOSPITAL COURSE:  GARETTE VIVONA is a 80 y.o. admitted on 06/14/2013 and found to have a diagnosis of LEFT KNEE OSTEOARTHRITIS.  After appropriate laboratory studies were obtained  they were taken to the operating room on 06/14/2013 and underwent  Procedure(s): TOTAL KNEE ARTHROPLASTY  Left.   They were given perioperative antibiotics:  Anti-infectives   Start     Dose/Rate Route Frequency Ordered Stop   06/14/13 2200  vancomycin (VANCOCIN) IVPB 1000 mg/200 mL premix     1,000 mg 200 mL/hr over 60 Minutes Intravenous Every 12 hours 06/14/13 1408 06/14/13 2342   06/14/13 0600  vancomycin (VANCOCIN) IVPB 1000 mg/200 mL premix     1,000 mg 200 mL/hr over 60 Minutes Intravenous On call to O.R. 06/13/13 1425 06/14/13 1105    .  Tolerated the procedure well.   Toradol was given post op.  POD #1, allowed out of bed to a chair.  PT for ambulation and exercise program.    IV saline locked.  O2 discontionued.  POD #2, continued PT and ambulation.   Hemovac pulled. He became disoriented probably secondary to oxycodone.  This was stopped and tramadol was begun and had a very good night . POD #3, continued with PT and ambulation The remainder of the hospital course was dedicated to ambulation and strengthening.   The  patient was discharged on 3 Days Post-Op in  Stable condition.  Blood products given:none  DIAGNOSTIC STUDIES: Recent vital signs:  Patient Vitals for the past 24 hrs:  BP Temp Temp src Pulse Resp SpO2  06/17/13 0525 139/54 mmHg 98.5 F (36.9 C) Oral 82 16 98 %  06/16/13 2253 119/58 mmHg - - 86 - -  06/16/13 2056 142/54 mmHg 98.8 F (37.1 C) Oral 91 18 97 %  06/16/13 1259 112/41 mmHg 98.9 F (37.2 C) Oral 88 17 97 %  06/16/13 1114 - - - - 16 -  06/16/13 0800 - - - - 18 -       Recent laboratory studies:  Recent Labs  06/14/13 1505 06/15/13 0500 06/16/13 0609 06/17/13 0355  WBC 6.7 11.0* 7.1 7.6  HGB 12.4* 10.0* 9.0* 8.7*  HCT 36.5* 29.0* 26.5* 25.9*  PLT 132* 146* 128* 126*    Recent Labs  06/14/13 1505 06/15/13 0500 06/16/13 0609 06/17/13 0355  NA  --  139 143 138  K  --  4.0 4.7 4.3  CL  --  105 107 101  CO2  --  22 27 25   BUN  --  18 22 17   CREATININE 0.80 0.80 0.96 0.93  GLUCOSE  --  166* 96 105*  CALCIUM  --  8.2* 8.1* 7.6*   Lab Results  Component Value Date   INR 1.77* 06/17/2013   INR 1.78* 06/16/2013   INR 1.40 06/15/2013     Recent Radiographic Studies :  No results found.  DISCHARGE INSTRUCTIONS:     Discharge Orders   Future Appointments Provider Department Dept Phone   06/27/2013 10:40 AM Tommy Medal, RPH-CPP [facility] [phone]   Future Orders Complete By Expires   Call MD / Call 911  As directed    Comments:     If you experience chest pain or shortness of breath, CALL 911 and be transported to the hospital emergency room.  If you develop a fever above 101 F, pus (white drainage) or increased drainage or redness at the wound, or calf pain, call your surgeon's office.   Change dressing  As directed    Comments:     Change dressing on Sunday, then change the dressing daily with sterile 4 x 4 inch gauze dressing and apply TED hose.  You may clean the incision with alcohol prior to redressing.   Constipation Prevention   As  directed    Comments:     Drink plenty of fluids.  Prune juice and/or coffee may be helpful.  You may use a stool softener, such as Colace (over the counter) 100 mg twice a day.  Use MiraLax (over the counter) for constipation as needed but this may take several days to work.  Mag Citrate --OR-- Milk of Magnesia --OR -- Dulcolax pills/suppositories may also be used but follow directions on the label.   CPM  As directed    Comments:     Continuous passive motion machine (CPM):      Use the CPM from 0 to 60 for 6-8 hours per day.      You may increase by 5-10 per day.  You may break it up into 2 or 3 sessions per day.      Use CPM for 3-4 weeks or until you are told to stop.   Diet - low sodium heart healthy  As directed    Discharge instructions  As directed    Comments:     YOU WERE GIVEN A DEVICE CALLED AN INCENTIVE SPIROMETER TO HELP YOU TAKE DEEP BREATHS.  PLEASE USE THIS AT LEAST TEN (10) TIMES EVERY 1-2 HOURS EVERY DAY TO PREVENT PNEUMONIA.   Do not put a pillow under the knee. Place it under the heel.  As directed    Driving restrictions  As directed    Comments:     No driving for 6 weeks   Increase activity slowly as tolerated  As directed    Lifting restrictions  As directed    Comments:     No lifting for 6 weeks   Partial weight bearing  As directed    Comments:     50 % WEIGHT BEARING AS TAUGHT IN PHYSICAL THERAPY   Questions:     % Body Weight:     Laterality:     Extremity:     Patient may shower  As directed    Comments:     You may shower over the brown dressing.  Once the dressing is removed you may shower without a dressing once there is no drainage.  Do not wash over the wound.  If drainage remains, cover wound with plastic wrap and then shower.   TED hose  As directed    Comments:     Use stockings (TED hose) for 2 weeks on operative leg(s).  You may remove them at night for sleeping.      DISCHARGE MEDICATIONS:     Medication List    STOP taking these  medications       GLUCOSAMINE PO      TAKE these medications       aspirin 81 MG tablet  Take 81 mg by mouth every morning.     atorvastatin 40 MG tablet  Commonly known as:  LIPITOR  Take 1 tablet (40 mg total) by mouth daily.     bisacodyl 10 MG suppository  Commonly known as:  DULCOLAX  Place 1 suppository (10 mg total) rectally daily as needed for moderate constipation.     Calcium Carbonate-Vitamin D 600-200 MG-UNIT Tabs  Take 1 tablet by mouth 2 (two) times daily.     carvedilol 6.25 MG tablet  Commonly known as:  COREG  Take 1 tablet (6.25 mg total) by mouth 2 (two) times daily.     DSS 100 MG Caps  Take 100 mg by mouth 2 (two) times daily.  enoxaparin 30 MG/0.3ML injection  Commonly known as:  LOVENOX  Inject 0.3 mLs (30 mg total) into the skin every 12 (twelve) hours. Until INR is >1.8 on coumadin     finasteride 5 MG tablet  Commonly known as:  PROSCAR  Take 5 mg by mouth every morning.     isosorbide mononitrate 30 MG 24 hr tablet  Commonly known as:  IMDUR  Take 30 mg by mouth every morning.     levothyroxine 88 MCG tablet  Commonly known as:  SYNTHROID, LEVOTHROID  Take 88 mcg by mouth every morning.     lisinopril 20 MG tablet  Commonly known as:  PRINIVIL,ZESTRIL  Take 20 mg by mouth at bedtime.     methocarbamol 500 MG tablet  Commonly known as:  ROBAXIN  Take 1 tablet (500 mg total) by mouth every 8 (eight) hours as needed for muscle spasms.     multivitamin capsule  Take 1 capsule by mouth every morning.     nitroGLYCERIN 0.4 MG SL tablet  Commonly known as:  NITROSTAT  Place 1 tablet (0.4 mg total) under the tongue every 5 (five) minutes as needed.     psyllium 58.6 % powder  Commonly known as:  METAMUCIL  Take 1 packet by mouth daily. (3 teaspoonsful once daily at bedtime)     tamsulosin 0.4 MG Caps capsule  Commonly known as:  FLOMAX  Take 0.4 mg by mouth at bedtime.     traMADol 50 MG tablet  Commonly known as:  ULTRAM    Take 1 tablet (50 mg total) by mouth every 6 (six) hours.     warfarin 5 MG tablet  Commonly known as:  COUMADIN  Take 2.5-5 mg by mouth daily. 2.5mg  on Monday and Friday        FOLLOW UP VISIT:   Follow-up Information   Follow up with [facility], Vonna Kotyk, MD. Schedule an appointment as soon as possible for a visit on 06/27/2013.   Specialty:  Orthopedic Surgery   Contact information:   [address] [place] [address] [phone]       DISPOSITION:   Skilled Nursing Facility/Rehab  CONDITION:  Stable   Naveh Rickles 06/17/2013, 7:48 AM

## 2013-06-16 NOTE — Progress Notes (Addendum)
Patient's daughter states patient saying things that don't make sense.Want Oxycodone D/C'd.  Patient answers name,place, time,situation appropriately. PA called and message left on cell.  [place], [place] called back, received order for pain medication change.

## 2013-06-16 NOTE — Clinical Social Work Psychosocial (Signed)
Clinical Social Work Department  BRIEF PSYCHOSOCIAL ASSESSMENT  Patient:Jerry Cain Account Number: [id number]   Admit date: 06/14/12 Clinical Social Worker Rhea Pink, MSW Date/Time: 06/16/2013 11:00 Referred by: Physician Date Referred: 06/16/12 Referred for   SNF Placement   Other Referral:  Interview type: Patient and patient's wife  Other interview type: PSYCHOSOCIAL DATA  Living Status: Spouse Admitted from facility:  Level of care:  Primary support name: Jerry Cain Primary support relationship to patient:  Degree of support available:  Strong and vested  CURRENT CONCERNS  Current Concerns   Post-Acute Placement   Other Concerns:  SOCIAL WORK ASSESSMENT / PLAN  CSW met with pt re: PT recommendation for SNF.   Pt lives with his wife  CSW explained placement process and answered questions.   Pt reports [facility]  as his preference - Patient was not agreeable to SNF until today. Patient is in a lot of pain and now realizes he is going to need rehab. CSW listened and offered support.  CSW completed FL2 and initiated SNF search.     Assessment/plan status: Information/Referral to [employer]  Other assessment/ plan:  Information/referral to community resources:  SNF   PTAR  PATIENT'S/FAMILY'S RESPONSE TO PLAN OF CARE:  Pt  reports he is agreeable to [facility] in order to increase strength and independence with mobility prior to returning home  Pt verbalized understanding of placement process and appreciation for CSW assist.   Rhea Pink, MSW, [place]

## 2013-06-16 NOTE — Progress Notes (Signed)
[name], male   DOB: 1931-09-17, 80 y.o.   MRN: [id number] [name]        MRN:  [id number]          DOB/AGE: 25-Aug-1931 / 80 y.o.    Joni Fears, MD   Biagio Borg, PA-C [address] [place], [place]  [address]                             [phone]   PROGRESS NOTE  Subjective:  negative for Chest Pain  negative for Shortness of Breath  negative for Nausea/Vomiting   negative for Calf Pain    Tolerating Diet: yes         Patient reports pain as moderate.     Feeling a little more pain today after the femoral nerve block "wore off"-family in room and has decided that rehab is in his best interest-social services working on placement  Objective: Vital signs in last 24 hours:   Patient Vitals for the past 24 hrs:  BP Temp Temp src Pulse Resp SpO2  06/16/13 0552 130/63 mmHg 97.9 F (36.6 C) Oral 74 16 96 %  06/16/13 0400 - - - - 16 98 %  06/16/13 0000 - - - - 16 98 %  06/15/13 2136 134/57 mmHg 98.4 F (36.9 C) Oral 77 16 99 %  06/15/13 2000 - - - - 16 98 %  06/15/13 1631 - - - - 16 98 %  06/15/13 1300 109/55 mmHg 97.2 F (36.2 C) - 66 16 98 %  06/15/13 1122 - - - - 16 97 %      Intake/Output from previous day:   03/11 0701 - 03/12 0700 In: 812.5 [P.O.:700; I.V.:112.5] Out: 875 [Urine:500; Drains:375]   Intake/Output this shift:       Intake/Output     03/11 0701 - 03/12 0700 03/12 0701 - 03/13 0700   P.O. 700    I.V. (mL/kg) 112.5 (1.2)    Other     Total Intake(mL/kg) 812.5 (8.9)    Urine (mL/kg/hr) 500 (0.2)    Drains 375 (0.2)    Total Output 875     Net -62.5          Urine Occurrence 1 x       LABORATORY DATA:  Recent Labs  06/14/13 1505 06/15/13 0500 06/16/13 0609  WBC 6.7 11.0* 7.1  HGB 12.4* 10.0* 9.0*  HCT 36.5* 29.0* 26.5*  PLT 132* 146* 128*    Recent Labs  06/14/13 1505 06/15/13 0500 06/16/13 0609  NA  --  139 143  K  --  4.0 4.7  CL  --  105 107  CO2  --  22 27  BUN  --   18 22  CREATININE 0.80 0.80 0.96  GLUCOSE  --  166* 96  CALCIUM  --  8.2* 8.1*   Lab Results  Component Value Date   INR 1.78* 06/16/2013   INR 1.40 06/15/2013   INR 1.23 06/14/2013    Recent Radiographic Studies :  No results found.   Examination:  General appearance: alert, cooperative and no distress  Wound Exam: clean, dry, intact   Drainage:  Scant/small amount Serosanguinous exudate  Motor Exam: EHL, FHL, Anterior Tibial and Posterior Tibial Intact  Sensory Exam: Superficial Peroneal, Deep Peroneal and Tibial normal  Vascular Exam: Normal  Assessment:    2 Days Post-Op  Procedure(s) (LRB):  TOTAL KNEE ARTHROPLASTY (Left)  ADDITIONAL DIAGNOSIS:  Active Problems:   S/P total knee replacement using cement  Acute Blood Loss Anemia-asymptomatic   Plan: Physical Therapy as ordered   DVT Prophylaxis:  Lovenox and Coumadin  DISCHARGE PLAN: Skilled Nursing Facility/Rehab  DISCHARGE NEEDS: HHPT, CPM, Walker and 3-in-1 comode seat     Voiding as per pre op, awake, alert without mental status changes, hemovac removed and dressing changed-wound clean and dry. Expect D/C tomorrow to [facility] facility in [place], [place] W 06/16/2013 10:30 AM

## 2013-06-16 NOTE — Clinical Social Work Placement (Addendum)
Clinical Social Work Department  CLINICAL SOCIAL WORK PLACEMENT NOTE   Patient: Jerry Cain  Account Number: [id number] [place] date: 06/14/13 Clinical Social Worker: Rhea Pink LCSWA Date/time: 06/16/2013 11:30 AM  Clinical Social Work is seeking post-discharge placement for this patient at the following level of care: SKILLED NURSING (*CSW will update this form in Epic as items are completed)  3/12/2015Patient/family provided with [place] Department of Clinical Social Work's list of facilities offering this level of care within the geographic area requested by the patient (or if unable, by the patient's family).  3/12/2015Patient/family informed of their freedom to choose among providers that offer the needed level of care, that participate in Medicare, Medicaid or managed care program needed by the patient, have an available bed and are willing to accept the patient.  06/16/2013 Patient/family informed of [facility]' ownership interest in [facility], as well as of the fact that they are under no obligation to receive care at this facility.  PASARR submitted to EDS on  06/17/2013 PASARR number received from EDS on 06/17/2013  FL2 transmitted to all facilities in geographic area requested by pt/family on 06/16/2013 FL2 transmitted to all facilities within larger geographic area on  Patient informed that his/her managed care company has contracts with or will negotiate with certain facilities, including the following:  Patient/family informed of bed offers received: 06/17/2013 Patient chooses bed at [facility] Physician recommends and patient chooses bed at  Patient to be transferred to on 06/17/2013  Patient to be transferred to facility by [facility] The following physician request were entered in Epic:  Additional Comments:

## 2013-06-16 NOTE — Progress Notes (Signed)
Physical Therapy Treatment Patient Details Name: Jerry Cain MRN: [id number] DOB: December 03, 1931 Today's Date: 06/16/2013 Time: 1432-1500 PT Time Calculation (min): 28 min  PT Assessment / Plan / Recommendation  History of Present Illness L TKA   PT Comments   Pt. Did not get OOB with PT today but family reports he was up in the recliner earlier.  He was having pain management issues so I asked RN Joycelyn Schmid to come in to discuss.  Daughter concerned that pt. Slept too much earlier today due to pain med.  He is currently awake but in significant knee pain which limits his ability to participate.  RN provided pt. One pain pill and advised pt. To call if another one needed and that she could call MD if needed to further  Address pain management.  Pt. Agreeable (reluctantly) to complete exercises in bed with this therapist.    Follow Up Recommendations  Supervision/Assistance - 24 hour;SNF     Does the patient have the potential to tolerate intense rehabilitation     Barriers to Discharge        Equipment Recommendations  Rolling walker with 5" wheels;3in1 (PT)    Recommendations for Other Services OT consult  Frequency 7X/week   Progress towards PT Goals Progress towards PT goals: Progressing toward goals  Plan Current plan remains appropriate    Precautions / Restrictions Precautions Precautions: Fall;Knee Precaution Comments: pt. with eggcrate rectangle appropriately under ankle upon PT entry into room Restrictions Weight Bearing Restrictions: Yes LLE Weight Bearing: Partial weight bearing LLE Partial Weight Bearing Percentage or Pounds: 50   Pertinent Vitals/Pain See vitals tab    Mobility  Bed Mobility Overal bed mobility:  (not assessed, pt. declined) Transfers Overall transfer level:  (not assessed, pt. declined)    Exercises Total Joint Exercises Ankle Circles/Pumps: AROM;Both;10 reps Quad Sets: AROM;Left;20 reps Short Arc QuadSinclair Ship;Left;10 reps;Supine Heel  Slides: Left;10 reps;AAROM Hip ABduction/ADduction: AAROM;Left;10 reps;Supine Straight Leg Raises: AAROM;Left;10 reps Goniometric ROM: ~ 45 degree flexion in supine position, however CPM has been set on 60   PT Diagnosis:    PT Problem List:   PT Treatment Interventions:     PT Goals (current goals can now be found in the care plan section)    Visit Information  Last PT Received On: 06/16/13 Assistance Needed: +2 History of Present Illness: L TKA    Subjective Data  Subjective: Pt. is in bed, daughter and he state pt. has been up earlier and will get OOB for the dinner meal, however he does not intend to get OOB currently due to level of pain.  He says "I want to do this, I just can't because of the pain".  Daughter is concerned that he slept alot earlier today, pt. concerned about pain level.     Cognition  Cognition Arousal/Alertness: Awake/alert Behavior During Therapy: WFL for tasks assessed/performed Overall Cognitive Status: Within Functional Limits for tasks assessed    Balance     End of Session     GP     Ladona Ridgel 06/16/2013, 5:15 PM Gerlean Ren PT Acute Rehab Services [phone] [place] [phone]

## 2013-06-16 NOTE — Progress Notes (Signed)
ANTICOAGULATION CONSULT NOTE - Initial Consult  Pharmacy Consult for Coumadin Indication: h/o afib (on chronic coumadin)   VTE prophylaxis  Allergies  Allergen Reactions  . Penicillins Other (See Comments)    Does not know     Patient Measurements: Height: 6' (182.9 cm) (from preadmit visit on 06/08/13) Weight: 200 lb 6.4 oz (90.901 kg) (from preadmit visit on 06/08/13) IBW/kg (Calculated) : 77.6 kg   Vital Signs: Temp: 98.9 F (37.2 C) (03/12 1259) Temp src: Oral (03/12 1259) BP: 112/41 mmHg (03/12 1259) Pulse Rate: 88 (03/12 1259)   Labs:   Baseline labs from 06/08/13:  LFTs wnl, Albumin wnl 3.7, H/H 12.7/37.1 and pltc 164K, WBC 6.2, SCR 0.92   Recent Labs  06/14/13 0841  06/14/13 1505 06/15/13 0500 06/16/13 0609  HGB  --   < > 12.4* 10.0* 9.0*  HCT  --   --  36.5* 29.0* 26.5*  PLT  --   --  132* 146* 128*  LABPROT 15.2  --   --  16.8* 20.2*  INR 1.23  --   --  1.40 1.78*  CREATININE  --   --  0.80 0.80 0.96  < > = values in this interval not displayed.  Estimated Creatinine Clearance: 68.5 ml/min (by C-G formula based on Cr of 0.96).   Medical History: Past Medical History  Diagnosis Date  . Hypercholesterolemia     takes Atorvastatin daily  . Gout   . BPH (benign prostatic hyperplasia)     takes Proscar daily  . CAD (coronary artery disease) 2009    cardiac cath and PTCA by Dr. Tami Ribas  . Tubular adenoma 2001  . Diverticulosis 2007    tcs by Dr. Laural Golden  . GERD (gastroesophageal reflux disease)   . Adenomatous colon polyp 2001  . Osteoarthritis     left knee  . Complication of anesthesia     hard to wake up  . Hypertension     takes Imdur,Coreg,and Lisinopril daily  . Atrial fibrillation     takes Coumadin daily  . Pneumonia     many,many yrs ago  . Numbness     right hand pointer and middle finger  . Carpal tunnel syndrome     right  . Joint pain   . Joint swelling   . Nocturia   . Hypothyroidism     takes SYnthroid daily  . Anxiety      Medications:  Prescriptions prior to admission  Medication Sig Dispense Refill  . aspirin 81 MG tablet Take 81 mg by mouth every morning.       Marland Kitchen atorvastatin (LIPITOR) 40 MG tablet Take 1 tablet (40 mg total) by mouth daily.  30 tablet  5  . Calcium Carbonate-Vitamin D (CALCIUM + D) 600-200 MG-UNIT TABS Take 1 tablet by mouth 2 (two) times daily.       . carvedilol (COREG) 6.25 MG tablet Take 1 tablet (6.25 mg total) by mouth 2 (two) times daily.  60 tablet  11  . finasteride (PROSCAR) 5 MG tablet Take 5 mg by mouth every morning.       . isosorbide mononitrate (IMDUR) 30 MG 24 hr tablet Take 30 mg by mouth every morning.      Marland Kitchen levothyroxine (SYNTHROID, LEVOTHROID) 88 MCG tablet Take 88 mcg by mouth every morning.      Marland Kitchen lisinopril (PRINIVIL,ZESTRIL) 20 MG tablet Take 20 mg by mouth at bedtime.      . Multiple Vitamin (MULTIVITAMIN) capsule Take 1  capsule by mouth every morning.       . nitroGLYCERIN (NITROSTAT) 0.4 MG SL tablet Place 1 tablet (0.4 mg total) under the tongue every 5 (five) minutes as needed.  25 tablet  2  . psyllium (METAMUCIL) 58.6 % powder Take 1 packet by mouth daily. (3 teaspoonsful once daily at bedtime)      . tamsulosin (FLOMAX) 0.4 MG CAPS capsule Take 0.4 mg by mouth at bedtime.       Marland Kitchen warfarin (COUMADIN) 5 MG tablet Take 2.5-5 mg by mouth daily. 2.5mg  on Monday and Friday      . [DISCONTINUED] Glucosamine HCl (GLUCOSAMINE PO) Take 2 tablets by mouth every evening.        Scheduled:  . aspirin EC  81 mg Oral q morning - 10a  . carvedilol  6.25 mg Oral BID  . docusate sodium  100 mg Oral BID  . enoxaparin (LOVENOX) injection  30 mg Subcutaneous Q12H  . finasteride  5 mg Oral q morning - 10a  . isosorbide mononitrate  30 mg Oral q morning - 10a  . levothyroxine  88 mcg Oral q morning - 10a  . lisinopril  20 mg Oral QHS  . tamsulosin  0.4 mg Oral QHS  . Warfarin - Pharmacist Dosing Inpatient   Does not apply q1800   Infusions:     Assessment: INR =  1.78 today. This is a 80  Y.o male POD#2 s/p LTKA today. He is on chronic coumadin for h/o afib. PTA coumadin dose 5mg  daily except 2.5 mg qMon and Friday, had been held x5 days prior to surgery per cardiology recommendation.  No bleeding noted.   Goal of Therapy:  INR 2-3 Monitor platelets by anticoagulation protocol: Yes   Plan:  Coumadin 5 mg po x1 Daily INR  Hughes Better, PharmD, BCPS Clinical Pharmacist Pager: [phone] 06/16/2013 1:16 PM

## 2013-06-16 NOTE — Progress Notes (Signed)
OT Cancellation Note  Patient Details Name: Jerry Cain MRN: [id number] DOB: [birth year]   Cancelled Treatment:    Reason Eval/Treat Not Completed: Other (comment) Pt is Medicare and current D/C plan is SNF. No apparent immediate acute care OT needs, therefore will defer OT to SNF. If OT eval is needed please call Acute Rehab Dept. at [phone] or text page OT at [phone].   Juluis Rainier [phone] 06/16/2013, 12:11 PM

## 2013-06-17 DIAGNOSIS — I4891 Unspecified atrial fibrillation: Secondary | ICD-10-CM | POA: Diagnosis not present

## 2013-06-17 DIAGNOSIS — J189 Pneumonia, unspecified organism: Secondary | ICD-10-CM | POA: Diagnosis not present

## 2013-06-17 DIAGNOSIS — M6281 Muscle weakness (generalized): Secondary | ICD-10-CM | POA: Diagnosis not present

## 2013-06-17 DIAGNOSIS — E7800 Pure hypercholesterolemia, unspecified: Secondary | ICD-10-CM | POA: Diagnosis not present

## 2013-06-17 DIAGNOSIS — E039 Hypothyroidism, unspecified: Secondary | ICD-10-CM | POA: Diagnosis not present

## 2013-06-17 DIAGNOSIS — M129 Arthropathy, unspecified: Secondary | ICD-10-CM | POA: Diagnosis not present

## 2013-06-17 DIAGNOSIS — G5600 Carpal tunnel syndrome, unspecified upper limb: Secondary | ICD-10-CM | POA: Diagnosis not present

## 2013-06-17 DIAGNOSIS — K219 Gastro-esophageal reflux disease without esophagitis: Secondary | ICD-10-CM | POA: Diagnosis not present

## 2013-06-17 DIAGNOSIS — K5730 Diverticulosis of large intestine without perforation or abscess without bleeding: Secondary | ICD-10-CM | POA: Diagnosis not present

## 2013-06-17 DIAGNOSIS — K5900 Constipation, unspecified: Secondary | ICD-10-CM | POA: Diagnosis not present

## 2013-06-17 DIAGNOSIS — R209 Unspecified disturbances of skin sensation: Secondary | ICD-10-CM | POA: Diagnosis not present

## 2013-06-17 DIAGNOSIS — S8990XA Unspecified injury of unspecified lower leg, initial encounter: Secondary | ICD-10-CM | POA: Diagnosis not present

## 2013-06-17 DIAGNOSIS — R262 Difficulty in walking, not elsewhere classified: Secondary | ICD-10-CM | POA: Diagnosis not present

## 2013-06-17 DIAGNOSIS — I10 Essential (primary) hypertension: Secondary | ICD-10-CM | POA: Diagnosis not present

## 2013-06-17 DIAGNOSIS — E785 Hyperlipidemia, unspecified: Secondary | ICD-10-CM | POA: Diagnosis not present

## 2013-06-17 DIAGNOSIS — M109 Gout, unspecified: Secondary | ICD-10-CM | POA: Diagnosis not present

## 2013-06-17 DIAGNOSIS — M25569 Pain in unspecified knee: Secondary | ICD-10-CM | POA: Diagnosis not present

## 2013-06-17 DIAGNOSIS — M2540 Effusion, unspecified joint: Secondary | ICD-10-CM | POA: Diagnosis not present

## 2013-06-17 DIAGNOSIS — D126 Benign neoplasm of colon, unspecified: Secondary | ICD-10-CM | POA: Diagnosis not present

## 2013-06-17 DIAGNOSIS — M1990 Unspecified osteoarthritis, unspecified site: Secondary | ICD-10-CM | POA: Diagnosis not present

## 2013-06-17 DIAGNOSIS — R351 Nocturia: Secondary | ICD-10-CM | POA: Diagnosis not present

## 2013-06-17 DIAGNOSIS — E46 Unspecified protein-calorie malnutrition: Secondary | ICD-10-CM | POA: Diagnosis not present

## 2013-06-17 DIAGNOSIS — N400 Enlarged prostate without lower urinary tract symptoms: Secondary | ICD-10-CM | POA: Diagnosis not present

## 2013-06-17 DIAGNOSIS — F411 Generalized anxiety disorder: Secondary | ICD-10-CM | POA: Diagnosis not present

## 2013-06-17 DIAGNOSIS — Z96659 Presence of unspecified artificial knee joint: Secondary | ICD-10-CM | POA: Diagnosis not present

## 2013-06-17 DIAGNOSIS — D649 Anemia, unspecified: Secondary | ICD-10-CM | POA: Diagnosis not present

## 2013-06-17 DIAGNOSIS — S99919A Unspecified injury of unspecified ankle, initial encounter: Secondary | ICD-10-CM | POA: Diagnosis not present

## 2013-06-17 LAB — CBC
HCT: 25.9 % — ABNORMAL LOW (ref 39.0–52.0)
HEMOGLOBIN: 8.7 g/dL — AB (ref 13.0–17.0)
MCH: 30.5 pg (ref 26.0–34.0)
MCHC: 33.6 g/dL (ref 30.0–36.0)
MCV: 90.9 fL (ref 78.0–100.0)
Platelets: 126 10*3/uL — ABNORMAL LOW (ref 150–400)
RBC: 2.85 MIL/uL — AB (ref 4.22–5.81)
RDW: 15.3 % (ref 11.5–15.5)
WBC: 7.6 10*3/uL (ref 4.0–10.5)

## 2013-06-17 LAB — BASIC METABOLIC PANEL
BUN: 17 mg/dL (ref 6–23)
CO2: 25 meq/L (ref 19–32)
Calcium: 7.6 mg/dL — ABNORMAL LOW (ref 8.4–10.5)
Chloride: 101 mEq/L (ref 96–112)
Creatinine, Ser: 0.93 mg/dL (ref 0.50–1.35)
GFR calc Af Amer: >90 mL/min (ref >90)
GFR calc non Af Amer: 78 mL/min — ABNORMAL LOW (ref >90)
GLUCOSE: 105 mg/dL — AB (ref 70–99)
POTASSIUM: 4.3 meq/L (ref 3.7–5.3)
SODIUM: 138 meq/L (ref 137–147)

## 2013-06-17 LAB — PROTIME-INR
INR: 1.77 — AB (ref 0.00–1.49)
Prothrombin Time: 20.1 seconds — ABNORMAL HIGH (ref 11.6–15.2)

## 2013-06-17 MED ORDER — TRAMADOL HCL 50 MG PO TABS: 50.0000 mg | ORAL_TABLET | Freq: Four times a day (QID) | ORAL | Status: DC

## 2013-06-17 NOTE — Progress Notes (Signed)
Patient d/c to SNF, IV removed.  Report called to SNF. 

## 2013-06-17 NOTE — Progress Notes (Signed)
Subjective: 3 Days Post-Op Procedure(s) (LRB): TOTAL KNEE ARTHROPLASTY (Left) Patient reports pain as mild and moderate.   Had a better night on tramadol.  Now without confusion Objective: Vital signs in last 24 hours: Temp:  [98.5 F (36.9 C)-98.9 F (37.2 C)] 98.5 F (36.9 C) (03/13 0525) Pulse Rate:  [82-91] 82 (03/13 0525) Resp:  [16-18] 16 (03/13 0525) BP: (112-142)/(41-58) 139/54 mmHg (03/13 0525) SpO2:  [97 %-98 %] 98 % (03/13 0525)  Intake/Output from previous day: 03/12 0701 - 03/13 0700 In: 500 [P.O.:500] Out: 1225 [Urine:1225] Intake/Output this shift:     Recent Labs  06/14/13 1505 06/15/13 0500 06/16/13 0609 06/17/13 0355  HGB 12.4* 10.0* 9.0* 8.7*    Recent Labs  06/16/13 0609 06/17/13 0355  WBC 7.1 7.6  RBC 2.94* 2.85*  HCT 26.5* 25.9*  PLT 128* 126*    Recent Labs  06/16/13 0609 06/17/13 0355  NA 143 138  K 4.7 4.3  CL 107 101  CO2 27 25  BUN 22 17  CREATININE 0.96 0.93  GLUCOSE 96 105*  CALCIUM 8.1* 7.6*    Recent Labs  06/16/13 0609 06/17/13 0355  INR 1.78* 1.77*    Neurovascular intact Sensation intact distally  Assessment/Plan: 3 Days Post-Op Procedure(s) (LRB): TOTAL KNEE ARTHROPLASTY (Left) Discharge to SNF  Dr [facility] 06/17/2013, 7:55 AM

## 2013-06-17 NOTE — Progress Notes (Signed)
Patient ID: PACE LAMADRID, male   DOB: Jan 05, 1932, 80 y.o.   MRN: [id number] PATIENT ID: ISAIH BULGER        MRN:  [id number]          DOB/AGE: 12-22-1931 / 80 y.o.    Joni Fears, MD   Biagio Borg, PA-C [address] [place], [place]  [address]                             [phone]   PROGRESS NOTE  Subjective:  negative for Chest Pain  negative for Shortness of Breath  negative for Nausea/Vomiting   negative for Calf Pain    Tolerating Diet: yes         Patient reports pain as 3 on 0-10 scale.     somewhat confused yesterday pm,  But OK this am-sitting up eating breakfast, awake , alert  Objective: Vital signs in last 24 hours:   Patient Vitals for the past 24 hrs:  BP Temp Temp src Pulse Resp SpO2  06/17/13 0525 139/54 mmHg 98.5 F (36.9 C) Oral 82 16 98 %  06/16/13 2253 119/58 mmHg - - 86 - -  06/16/13 2056 142/54 mmHg 98.8 F (37.1 C) Oral 91 18 97 %  06/16/13 1259 112/41 mmHg 98.9 F (37.2 C) Oral 88 17 97 %  06/16/13 1114 - - - - 16 -  06/16/13 0800 - - - - 18 -      Intake/Output from previous day:   03/12 0701 - 03/13 0700 In: 500 [P.O.:500] Out: 1225 [Urine:1225]   Intake/Output this shift:       Intake/Output     03/12 0701 - 03/13 0700 03/13 0701 - 03/14 0700   P.O. 500    I.V. (mL/kg)     Total Intake(mL/kg) 500 (5.5)    Urine (mL/kg/hr) 1225 (0.6)    Drains     Total Output 1225     Net -725          Urine Occurrence 1 x       LABORATORY DATA:  Recent Labs  06/14/13 1505 06/15/13 0500 06/16/13 0609 06/17/13 0355  WBC 6.7 11.0* 7.1 7.6  HGB 12.4* 10.0* 9.0* 8.7*  HCT 36.5* 29.0* 26.5* 25.9*  PLT 132* 146* 128* 126*    Recent Labs  06/14/13 1505 06/15/13 0500 06/16/13 0609 06/17/13 0355  NA  --  139 143 138  K  --  4.0 4.7 4.3  CL  --  105 107 101  CO2  --  22 27 25   BUN  --  18 22 17   CREATININE 0.80 0.80 0.96 0.93  GLUCOSE  --  166* 96 105*  CALCIUM  --  8.2* 8.1* 7.6*   Lab Results  Component  Value Date   INR 1.77* 06/17/2013   INR 1.78* 06/16/2013   INR 1.40 06/15/2013    Recent Radiographic Studies :  Dg Knee 1-2 Views Left  06/16/2013   CLINICAL DATA:  Left knee arthroplasty 2 days ago with pain in the patellar region  EXAM: LEFT KNEE - 1-2 VIEW  COMPARISON:  None.  FINDINGS: Anterior postsurgical change. Total knee prosthesis with components in anticipated position. Small joint effusion postoperatively.  IMPRESSION: Anticipated postoperative appearance   Electronically Signed   By: Skipper Cliche M.D.   On: 06/16/2013 17:22     Examination:  General appearance: alert, cooperative and no distress  Wound Exam: clean,  dry, intact   Drainage:  None: wound tissue dry  Motor Exam: EHL, FHL, Anterior Tibial and Posterior Tibial Intact  Sensory Exam: Superficial Peroneal, Deep Peroneal and Tibial normal  Vascular Exam: Normal  Assessment:    3 Days Post-Op  Procedure(s) (LRB): TOTAL KNEE ARTHROPLASTY (Left)  ADDITIONAL DIAGNOSIS:  Principal Problem:   Arthritis of knee, left Active Problems:   S/P total knee replacement using cement  Acute Blood Loss Anemia-stable, asymptomatic   Plan: Physical Therapy as ordered Partial Weight Bearing @ 50% (PWB)  DVT Prophylaxis:  Lovenox and Coumadin  DISCHARGE PLAN: Skilled Nursing Facility/Rehab  DISCHARGE NEEDS: HHPT, CPM, Walker and 3-in-1 comode seat   having difficulty with ROM left knee..films obtained last night without complicating features-will plan on transfer to a rehab facility, switched to tramadol for pain      Garald Balding 06/17/2013 7:50 AM

## 2013-06-17 NOTE — Progress Notes (Signed)
Clinical social worker assisted with patient discharge to skilled nursing facility, [facility].  CSW addressed all family questions and concerns. CSW copied chart and added all important documents. CSW also set up patient transportation with Diplomatic Services operational officer. Clinical Social Worker will sign off for now as social work intervention is no longer needed.   Rhea Pink, MSW, [place]

## 2013-06-22 DIAGNOSIS — E46 Unspecified protein-calorie malnutrition: Secondary | ICD-10-CM | POA: Diagnosis not present

## 2013-06-22 DIAGNOSIS — E785 Hyperlipidemia, unspecified: Secondary | ICD-10-CM | POA: Diagnosis not present

## 2013-06-22 DIAGNOSIS — E039 Hypothyroidism, unspecified: Secondary | ICD-10-CM | POA: Diagnosis not present

## 2013-06-22 DIAGNOSIS — I4891 Unspecified atrial fibrillation: Secondary | ICD-10-CM | POA: Diagnosis not present

## 2013-06-22 DIAGNOSIS — D649 Anemia, unspecified: Secondary | ICD-10-CM | POA: Diagnosis not present

## 2013-06-22 DIAGNOSIS — K5900 Constipation, unspecified: Secondary | ICD-10-CM | POA: Diagnosis not present

## 2013-06-22 DIAGNOSIS — I10 Essential (primary) hypertension: Secondary | ICD-10-CM | POA: Diagnosis not present

## 2013-06-23 DIAGNOSIS — I10 Essential (primary) hypertension: Secondary | ICD-10-CM | POA: Diagnosis not present

## 2013-06-23 DIAGNOSIS — E785 Hyperlipidemia, unspecified: Secondary | ICD-10-CM | POA: Diagnosis not present

## 2013-06-23 DIAGNOSIS — E039 Hypothyroidism, unspecified: Secondary | ICD-10-CM | POA: Diagnosis not present

## 2013-06-23 DIAGNOSIS — I4891 Unspecified atrial fibrillation: Secondary | ICD-10-CM | POA: Diagnosis not present

## 2013-06-23 DIAGNOSIS — D649 Anemia, unspecified: Secondary | ICD-10-CM | POA: Diagnosis not present

## 2013-06-23 DIAGNOSIS — E46 Unspecified protein-calorie malnutrition: Secondary | ICD-10-CM | POA: Diagnosis not present

## 2013-06-23 DIAGNOSIS — K5900 Constipation, unspecified: Secondary | ICD-10-CM | POA: Diagnosis not present

## 2013-06-27 ENCOUNTER — Ambulatory Visit: Payer: Medicare Other | Admitting: Pharmacist Clinician (PhC)/ Clinical Pharmacy Specialist

## 2013-06-27 DIAGNOSIS — Z96659 Presence of unspecified artificial knee joint: Secondary | ICD-10-CM | POA: Diagnosis not present

## 2013-06-28 DIAGNOSIS — I4891 Unspecified atrial fibrillation: Secondary | ICD-10-CM | POA: Diagnosis not present

## 2013-07-05 DIAGNOSIS — Z96659 Presence of unspecified artificial knee joint: Secondary | ICD-10-CM | POA: Diagnosis not present

## 2013-07-05 DIAGNOSIS — Z5189 Encounter for other specified aftercare: Secondary | ICD-10-CM | POA: Diagnosis not present

## 2013-07-05 DIAGNOSIS — I10 Essential (primary) hypertension: Secondary | ICD-10-CM | POA: Diagnosis not present

## 2013-07-05 DIAGNOSIS — Z471 Aftercare following joint replacement surgery: Secondary | ICD-10-CM | POA: Diagnosis not present

## 2013-07-05 DIAGNOSIS — I4891 Unspecified atrial fibrillation: Secondary | ICD-10-CM | POA: Diagnosis not present

## 2013-07-06 ENCOUNTER — Ambulatory Visit (INDEPENDENT_AMBULATORY_CARE_PROVIDER_SITE_OTHER): Payer: Medicare Other | Admitting: Pharmacist Clinician (PhC)/ Clinical Pharmacy Specialist

## 2013-07-06 ENCOUNTER — Telehealth: Payer: Self-pay | Admitting: Pharmacist Clinician (PhC)/ Clinical Pharmacy Specialist

## 2013-07-06 DIAGNOSIS — I10 Essential (primary) hypertension: Secondary | ICD-10-CM | POA: Diagnosis not present

## 2013-07-06 DIAGNOSIS — Z7901 Long term (current) use of anticoagulants: Secondary | ICD-10-CM

## 2013-07-06 DIAGNOSIS — Z471 Aftercare following joint replacement surgery: Secondary | ICD-10-CM | POA: Diagnosis not present

## 2013-07-06 DIAGNOSIS — Z5189 Encounter for other specified aftercare: Secondary | ICD-10-CM | POA: Diagnosis not present

## 2013-07-06 DIAGNOSIS — I4891 Unspecified atrial fibrillation: Secondary | ICD-10-CM | POA: Diagnosis not present

## 2013-07-06 DIAGNOSIS — Z96659 Presence of unspecified artificial knee joint: Secondary | ICD-10-CM | POA: Diagnosis not present

## 2013-07-06 LAB — POCT INR: INR: 2.7

## 2013-07-06 NOTE — Telephone Encounter (Signed)
Updated medication list after d/c from rehab

## 2013-07-07 DIAGNOSIS — Z96659 Presence of unspecified artificial knee joint: Secondary | ICD-10-CM | POA: Diagnosis not present

## 2013-07-07 DIAGNOSIS — Z471 Aftercare following joint replacement surgery: Secondary | ICD-10-CM | POA: Diagnosis not present

## 2013-07-07 DIAGNOSIS — Z5189 Encounter for other specified aftercare: Secondary | ICD-10-CM | POA: Diagnosis not present

## 2013-07-07 DIAGNOSIS — I4891 Unspecified atrial fibrillation: Secondary | ICD-10-CM | POA: Diagnosis not present

## 2013-07-07 DIAGNOSIS — I10 Essential (primary) hypertension: Secondary | ICD-10-CM | POA: Diagnosis not present

## 2013-07-09 DIAGNOSIS — I4891 Unspecified atrial fibrillation: Secondary | ICD-10-CM | POA: Diagnosis not present

## 2013-07-09 DIAGNOSIS — Z471 Aftercare following joint replacement surgery: Secondary | ICD-10-CM | POA: Diagnosis not present

## 2013-07-09 DIAGNOSIS — Z96659 Presence of unspecified artificial knee joint: Secondary | ICD-10-CM | POA: Diagnosis not present

## 2013-07-09 DIAGNOSIS — Z5189 Encounter for other specified aftercare: Secondary | ICD-10-CM | POA: Diagnosis not present

## 2013-07-09 DIAGNOSIS — I10 Essential (primary) hypertension: Secondary | ICD-10-CM | POA: Diagnosis not present

## 2013-07-11 DIAGNOSIS — Z5189 Encounter for other specified aftercare: Secondary | ICD-10-CM | POA: Diagnosis not present

## 2013-07-11 DIAGNOSIS — Z96659 Presence of unspecified artificial knee joint: Secondary | ICD-10-CM | POA: Diagnosis not present

## 2013-07-11 DIAGNOSIS — I10 Essential (primary) hypertension: Secondary | ICD-10-CM | POA: Diagnosis not present

## 2013-07-11 DIAGNOSIS — I4891 Unspecified atrial fibrillation: Secondary | ICD-10-CM | POA: Diagnosis not present

## 2013-07-11 DIAGNOSIS — Z471 Aftercare following joint replacement surgery: Secondary | ICD-10-CM | POA: Diagnosis not present

## 2013-07-15 DIAGNOSIS — Z96659 Presence of unspecified artificial knee joint: Secondary | ICD-10-CM | POA: Diagnosis not present

## 2013-07-15 DIAGNOSIS — I4891 Unspecified atrial fibrillation: Secondary | ICD-10-CM | POA: Diagnosis not present

## 2013-07-15 DIAGNOSIS — I10 Essential (primary) hypertension: Secondary | ICD-10-CM | POA: Diagnosis not present

## 2013-07-15 DIAGNOSIS — Z471 Aftercare following joint replacement surgery: Secondary | ICD-10-CM | POA: Diagnosis not present

## 2013-07-15 DIAGNOSIS — Z5189 Encounter for other specified aftercare: Secondary | ICD-10-CM | POA: Diagnosis not present

## 2013-07-20 ENCOUNTER — Ambulatory Visit (INDEPENDENT_AMBULATORY_CARE_PROVIDER_SITE_OTHER): Payer: Medicare Other | Admitting: Pharmacist Clinician (PhC)/ Clinical Pharmacy Specialist

## 2013-07-20 DIAGNOSIS — Z7901 Long term (current) use of anticoagulants: Secondary | ICD-10-CM

## 2013-07-20 DIAGNOSIS — I4891 Unspecified atrial fibrillation: Secondary | ICD-10-CM

## 2013-07-20 LAB — POCT INR: INR: 4

## 2013-08-01 DIAGNOSIS — E785 Hyperlipidemia, unspecified: Secondary | ICD-10-CM | POA: Diagnosis not present

## 2013-08-01 DIAGNOSIS — M67919 Unspecified disorder of synovium and tendon, unspecified shoulder: Secondary | ICD-10-CM | POA: Diagnosis not present

## 2013-08-01 DIAGNOSIS — I4891 Unspecified atrial fibrillation: Secondary | ICD-10-CM | POA: Diagnosis not present

## 2013-08-01 DIAGNOSIS — I10 Essential (primary) hypertension: Secondary | ICD-10-CM | POA: Diagnosis not present

## 2013-08-01 DIAGNOSIS — M719 Bursopathy, unspecified: Secondary | ICD-10-CM | POA: Diagnosis not present

## 2013-08-03 ENCOUNTER — Ambulatory Visit (INDEPENDENT_AMBULATORY_CARE_PROVIDER_SITE_OTHER): Payer: Medicare Other | Admitting: Pharmacist Clinician (PhC)/ Clinical Pharmacy Specialist

## 2013-08-03 ENCOUNTER — Other Ambulatory Visit: Payer: Self-pay | Admitting: Pharmacist Clinician (PhC)/ Clinical Pharmacy Specialist

## 2013-08-03 DIAGNOSIS — Z7901 Long term (current) use of anticoagulants: Secondary | ICD-10-CM

## 2013-08-03 DIAGNOSIS — I4891 Unspecified atrial fibrillation: Secondary | ICD-10-CM

## 2013-08-03 LAB — POCT INR: INR: 2.8

## 2013-08-03 MED ORDER — WARFARIN SODIUM 5 MG PO TABS: ORAL_TABLET | ORAL | Status: DC

## 2013-08-08 ENCOUNTER — Telehealth: Payer: Self-pay | Admitting: Cardiovascular Disease

## 2013-08-08 NOTE — Telephone Encounter (Signed)
Dr. Rudene Anda office called to get an OK to use Voltaren Gel on patients knee.  Need authorization due to Coumadin.  Per Cecilie Kicks, NP OK to use Voltaren Gel.

## 2013-08-31 ENCOUNTER — Ambulatory Visit (INDEPENDENT_AMBULATORY_CARE_PROVIDER_SITE_OTHER): Payer: Medicare Other | Admitting: Pharmacist Clinician (PhC)/ Clinical Pharmacy Specialist

## 2013-08-31 DIAGNOSIS — Z7901 Long term (current) use of anticoagulants: Secondary | ICD-10-CM

## 2013-08-31 DIAGNOSIS — I4891 Unspecified atrial fibrillation: Secondary | ICD-10-CM

## 2013-08-31 LAB — POCT INR: INR: 3

## 2013-09-17 ENCOUNTER — Other Ambulatory Visit: Payer: Self-pay | Admitting: Cardiovascular Disease

## 2013-09-19 NOTE — Telephone Encounter (Signed)
Rx was sent to pharmacy electronically. 

## 2013-09-28 ENCOUNTER — Ambulatory Visit (INDEPENDENT_AMBULATORY_CARE_PROVIDER_SITE_OTHER): Payer: Medicare Other | Admitting: Pharmacist Clinician (PhC)/ Clinical Pharmacy Specialist

## 2013-09-28 DIAGNOSIS — Z7901 Long term (current) use of anticoagulants: Secondary | ICD-10-CM

## 2013-09-28 DIAGNOSIS — I4891 Unspecified atrial fibrillation: Secondary | ICD-10-CM | POA: Diagnosis not present

## 2013-09-28 LAB — POCT INR: INR: 4

## 2013-09-30 DIAGNOSIS — M2010 Hallux valgus (acquired), unspecified foot: Secondary | ICD-10-CM | POA: Diagnosis not present

## 2013-09-30 DIAGNOSIS — M2040 Other hammer toe(s) (acquired), unspecified foot: Secondary | ICD-10-CM | POA: Diagnosis not present

## 2013-10-12 ENCOUNTER — Ambulatory Visit (INDEPENDENT_AMBULATORY_CARE_PROVIDER_SITE_OTHER): Payer: Medicare Other | Admitting: Pharmacist

## 2013-10-12 DIAGNOSIS — Z7901 Long term (current) use of anticoagulants: Secondary | ICD-10-CM | POA: Diagnosis not present

## 2013-10-12 DIAGNOSIS — I4891 Unspecified atrial fibrillation: Secondary | ICD-10-CM

## 2013-10-12 LAB — POCT INR: INR: 3.4

## 2013-10-17 DIAGNOSIS — Z96659 Presence of unspecified artificial knee joint: Secondary | ICD-10-CM | POA: Diagnosis not present

## 2013-10-17 DIAGNOSIS — M1710 Unilateral primary osteoarthritis, unspecified knee: Secondary | ICD-10-CM | POA: Diagnosis not present

## 2013-10-24 DIAGNOSIS — N401 Enlarged prostate with lower urinary tract symptoms: Secondary | ICD-10-CM | POA: Diagnosis not present

## 2013-10-26 ENCOUNTER — Ambulatory Visit (INDEPENDENT_AMBULATORY_CARE_PROVIDER_SITE_OTHER): Payer: Medicare Other | Admitting: Pharmacist Clinician (PhC)/ Clinical Pharmacy Specialist

## 2013-10-26 DIAGNOSIS — Z7901 Long term (current) use of anticoagulants: Secondary | ICD-10-CM | POA: Diagnosis not present

## 2013-10-26 DIAGNOSIS — I4891 Unspecified atrial fibrillation: Secondary | ICD-10-CM

## 2013-10-26 LAB — POCT INR: INR: 3.4

## 2013-11-10 ENCOUNTER — Ambulatory Visit: Payer: Medicare Other | Admitting: Cardiovascular Disease

## 2013-11-10 ENCOUNTER — Ambulatory Visit (INDEPENDENT_AMBULATORY_CARE_PROVIDER_SITE_OTHER): Payer: Medicare Other | Admitting: Cardiovascular Disease

## 2013-11-10 ENCOUNTER — Encounter: Payer: Self-pay | Admitting: Cardiovascular Disease

## 2013-11-10 ENCOUNTER — Ambulatory Visit (INDEPENDENT_AMBULATORY_CARE_PROVIDER_SITE_OTHER): Payer: Medicare Other | Admitting: *Deleted

## 2013-11-10 VITALS — BP 152/74 | HR 59 | Resp 16 | Ht 72.0 in | Wt 200.5 lb

## 2013-11-10 DIAGNOSIS — I4891 Unspecified atrial fibrillation: Secondary | ICD-10-CM

## 2013-11-10 DIAGNOSIS — I25119 Atherosclerotic heart disease of native coronary artery with unspecified angina pectoris: Secondary | ICD-10-CM

## 2013-11-10 DIAGNOSIS — Z7901 Long term (current) use of anticoagulants: Secondary | ICD-10-CM | POA: Diagnosis not present

## 2013-11-10 DIAGNOSIS — I2510 Atherosclerotic heart disease of native coronary artery without angina pectoris: Secondary | ICD-10-CM | POA: Diagnosis not present

## 2013-11-10 DIAGNOSIS — I10 Essential (primary) hypertension: Secondary | ICD-10-CM | POA: Diagnosis not present

## 2013-11-10 DIAGNOSIS — I209 Angina pectoris, unspecified: Secondary | ICD-10-CM

## 2013-11-10 DIAGNOSIS — I480 Paroxysmal atrial fibrillation: Secondary | ICD-10-CM

## 2013-11-10 LAB — POCT INR: INR: 2.6

## 2013-11-10 NOTE — Assessment & Plan Note (Addendum)
Asymptomatic at this time. Previously had a low risk nuclear perfusion study ~2 years ago. Discussed getting back to his previous level of exercise. To continue aspirin, imdur, statin, coreg.

## 2013-11-10 NOTE — Patient Instructions (Signed)
Dr. Sallyanne Kuster recommends that you schedule a follow-up appointment in: ONE year.

## 2013-11-10 NOTE — Assessment & Plan Note (Addendum)
No sustained recurrent events. Will continue on carvedilol and warfarin. INR today is at goal.

## 2013-11-10 NOTE — Assessment & Plan Note (Signed)
Near goal today. Will continue to monitor and continue current regimen.

## 2013-11-10 NOTE — Progress Notes (Signed)
[name], male   DOB: 03-Mar-1932, 80 y.o.   MRN: [id number]     Reason for office visit Paroxysmal afib, CAD  Patient has done well since his last visit. He notes a "skipped beat every once in a while." Happens once a day. Mostly at night. Will wake up at night with this. Does not last long and he is able to go back to sleep following this. No pain or dyspnea, orthopnea, PND. Still taking warfarin. Notes occasional bleeding when hits himself on the arm that stops easily with application of pressure. No falls.   Has had feet and knees operated on since last visit. Feet in November 2014 and knee replaced June 15, 2013. Has done well since then. [place] and had no issues with this. He notes he has not gotten back in to the pool [facility] He plans to get back to swimming as soon as he can.  In 2009, he received drug eluting Cypher stents to the LAD and the circumflex coronary arteries. Comorbidity includes systemic hypertension and hyperlipidemia both of which are adequately treated. By echocardiography in 2013 he has preserved left ventricular systolic function and no valvular abnormalities. He had a normal stress nuclear myocardial scan in September of 2013.    Allergies  Allergen Reactions  . Penicillins Other (See Comments)    Does not know     Current Outpatient Prescriptions  Medication Sig Dispense Refill  . aspirin 81 MG tablet Take 81 mg by mouth every morning.       Marland Kitchen atorvastatin (LIPITOR) 40 MG tablet TAKE (1) TABLET BY MOUTH AT BEDTIME.(8PM)  30 tablet  8  . Calcium Carbonate-Vitamin D (CALCIUM + D) 600-200 MG-UNIT TABS Take 1 tablet by mouth 2 (two) times daily.       . carvedilol (COREG) 6.25 MG tablet Take 1 tablet (6.25 mg total) by mouth 2 (two) times daily.  60 tablet  11  . finasteride (PROSCAR) 5 MG tablet Take 5 mg by mouth every morning.       . Glucosamine  750 MG TABS Take 750 mg by mouth 2 (two) times daily.      . isosorbide mononitrate (IMDUR) 30 MG 24 hr tablet Take 30 mg by mouth every morning.      Marland Kitchen levothyroxine (SYNTHROID, LEVOTHROID) 100 MCG tablet Take 100 mcg by mouth daily before breakfast.      . lisinopril (PRINIVIL,ZESTRIL) 20 MG tablet Take 20 mg by mouth at bedtime.      . Multiple Vitamin (MULTIVITAMIN) capsule Take 1 capsule by mouth every morning.       . nitroGLYCERIN (NITROSTAT) 0.4 MG SL tablet Place 1 tablet (0.4 mg total) under the tongue every 5 (five) minutes as needed.  25 tablet  2  . tamsulosin (FLOMAX) 0.4 MG CAPS capsule Take 0.4 mg by mouth at bedtime.       Marland Kitchen warfarin (COUMADIN) 5 MG tablet Take 1 to 1.5 tablets by mouth daily as directed  35 tablet  5   No current facility-administered medications for this visit.    Past Medical History  Diagnosis Date  . Hypercholesterolemia     takes Atorvastatin daily  . Gout   . BPH (benign prostatic hyperplasia)     takes Proscar daily  . CAD (coronary artery disease) 2009    cardiac cath and PTCA by Dr. Tami Ribas  .  Tubular adenoma 2001  . Diverticulosis 2007    tcs by Dr. Laural Golden  . GERD (gastroesophageal reflux disease)   . Adenomatous colon polyp 2001  . Osteoarthritis     left knee  . Complication of anesthesia     hard to wake up  . Hypertension     takes Imdur,Coreg,and Lisinopril daily  . Atrial fibrillation     takes Coumadin daily  . Pneumonia     many,many yrs ago  . Numbness     right hand pointer and middle finger  . Carpal tunnel syndrome     right  . Joint pain   . Joint swelling   . Nocturia   . Hypothyroidism     takes SYnthroid daily  . Anxiety     Past Surgical History  Procedure Laterality Date  . Hernia repair  2009    left inguinal  . Cataract extraction      bilateral  . Colonoscopy  2007    Dr. Laural Golden- L sided diverticulosis. Next TCS 2012 due to h/o tubular adenoma.  . Colonoscopy  02/26/2011    Procedure:  COLONOSCOPY;  Surgeon: Daneil Dolin, MD;  [facility];  Service: Endoscopy;  Laterality: N/A;  11:05  . Tee without cardioversion  12/12/2011    Procedure: TRANSESOPHAGEAL ECHOCARDIOGRAM (TEE);  Surgeon: Sanda Klein, MD;  Location: [facility] ENDOSCOPY;  Service: Cardiovascular;  Laterality: N/A;   successful/sinus rhythm  . Cardioversion  12/12/2011    Procedure: CARDIOVERSION;  Surgeon: Sanda Klein, MD;  Location: [place];  Service: Cardiovascular;  Laterality: N/A;  . Bunionectomy Right 03/17/2013    Procedure: Lillard Anes, Arthroplasty 2nd toe right foot;  Surgeon: Marcheta Grammes, DPM;  Location: [facility];  Service: Orthopedics;  Laterality: Right;  . Aiken osteotomy Right 03/17/2013    Procedure: Barbie Banner OSTEOTOMY;  Surgeon: Marcheta Grammes, DPM;  Location: [facility];  Service: Orthopedics;  Laterality: Right;  . Flexor tenotomy Left 03/17/2013    Procedure: PERCUTANEOUS FLEXOR TENOTOMY 3RD TOE LEFT FOOT;  Surgeon: Marcheta Grammes, DPM;  Location: [facility];  Service: Orthopedics;  Laterality: Left;  . Cardiac catheterization  06/11/2007    PTCA X2 in 06/2007:  2 overlapping Cypher stents to LAD (2.5x13 and 3.0x23)  . Cardiac catheterization  06/21/2007    Cypher stent 2.5 x 13  to OM branch  . Coronary angioplasty      x 3   . Total knee arthroplasty Left 06/14/2013    Procedure: TOTAL KNEE ARTHROPLASTY;  Surgeon: Garald Balding, MD;  Location: [place];  Service: Orthopedics;  Laterality: Left;    Family History  Problem Relation Age of Onset  . Colon cancer Neg Hx   . Pneumonia Father   . Heart disease Brother     open heart surgery at age 67    History   Social History  . Marital Status: Married    Spouse Name: N/A    Number of Children: 4  . Years of Education: N/A   Occupational History  . retired    Social History Main Topics  . Smoking status: Never Smoker   . Smokeless tobacco: Not on file  . Alcohol Use: No  . Drug Use: No  . Sexual  Activity: Not on file   Other Topics Concern  . Not on file   Social History Narrative  . No narrative on file    Review of systems: The patient specifically denies any chest pain at rest or  with exertion, dyspnea at rest or with exertion, orthopnea, paroxysmal nocturnal dyspnea, syncope, focal neurological deficits, intermittent claudication, lower extremity edema, unexplained weight gain, cough, hemoptysis or wheezing.   PHYSICAL EXAM BP 152/74  Pulse 59  Resp 16  Ht 6' (1.829 m)  Wt 200 lb 8 oz (90.946 kg)  BMI 27.19 kg/m2 General: Alert, oriented x3, no distress  Head: no evidence of trauma, no exophtalmos or lid lag, no myxedema, no xanthelasma; normal ears, nose and oropharynx  Neck: normal jugular venous pulsations; brisk carotid pulses without delay and no carotid bruits  Chest: clear to auscultation, symmetrical and full respiratory excursions  Cardiovascular: normal position and quality of the apical impulse, regular rhythm, normal first and second heart sounds, no murmurs, rubs or gallops   Extremities: mild edema around left knee, no edema in distal LE, no clubbing, cyanosis; 2+ radial pulses bilaterally Neurological: grossly nonfocal   EKG: NSR, single PAC noted, rate 59  Lipid Panel     Component Value Date/Time   CHOL  Value: 104        ATP III CLASSIFICATION:  <200     mg/dL   Desirable  200-239  mg/dL   Borderline High  >=240    mg/dL   High 06/19/2007 0223   TRIG 102 06/19/2007 0223   HDL 19* 06/19/2007 0223   CHOLHDL 5.5 06/19/2007 0223   VLDL 20 06/19/2007 0223   LDLCALC  Value: 65        Total Cholesterol/HDL:CHD Risk Coronary Heart Disease Risk Table                     Men   Women  1/2 Average Risk   3.4   3.3 06/19/2007 0223    BMET    Component Value Date/Time   NA 138 06/17/2013 0355   K 4.3 06/17/2013 0355   CL 101 06/17/2013 0355   CO2 25 06/17/2013 0355   GLUCOSE 105* 06/17/2013 0355   BUN 17 06/17/2013 0355   CREATININE 0.93 06/17/2013 0355    CALCIUM 7.6* 06/17/2013 0355   GFRNONAA 78* 06/17/2013 0355   GFRAA >90 06/17/2013 0355     ASSESSMENT AND PLAN CAD (coronary artery disease) Asymptomatic at this time. Previously had a low risk nuclear perfusion study ~2 years ago. Discussed getting back to his previous level of exercise. To continue aspirin, imdur, statin, coreg.  Atrial fibrillation No sustained recurrent events. Will continue on carvedilol and warfarin. INR today is at goal.  HTN (hypertension) Near goal today. Will continue to monitor and continue current regimen.   Follow-up in one year.  Tommi Rumps, MD [place] PGY-3  I have seen and examined the patient along with Tommi Rumps, MD.  I have reviewed the chart, notes and new data.  I agree with his note.  Key new complaints: gradually improving swelling and limited range of flexion and his prosthetic left knee;shoulder pain and weakness when he tries to swim; 10 pound weight gain secondary to reduced exercise Key examination changes: no detectable arrhythmia or signs of hypervolemia  PLAN: No complaints of angina or dyspnea with exertion. Gradually needs to get back into shape and return to his previous swimming. No change in medication.  Sanda Klein, MD, [place] [phone] 11/10/2013, 1:51 PM

## 2013-11-29 ENCOUNTER — Ambulatory Visit: Payer: Medicare Other | Admitting: Urology

## 2013-12-07 ENCOUNTER — Other Ambulatory Visit: Payer: Self-pay | Admitting: Cardiovascular Disease

## 2013-12-07 NOTE — Telephone Encounter (Signed)
Rx was sent to pharmacy electronically. 

## 2013-12-09 ENCOUNTER — Ambulatory Visit (INDEPENDENT_AMBULATORY_CARE_PROVIDER_SITE_OTHER): Payer: Medicare Other | Admitting: Pharmacist Clinician (PhC)/ Clinical Pharmacy Specialist

## 2013-12-09 DIAGNOSIS — I480 Paroxysmal atrial fibrillation: Secondary | ICD-10-CM

## 2013-12-09 DIAGNOSIS — I4891 Unspecified atrial fibrillation: Secondary | ICD-10-CM

## 2013-12-09 DIAGNOSIS — Z7901 Long term (current) use of anticoagulants: Secondary | ICD-10-CM

## 2013-12-09 LAB — POCT INR: INR: 3.1

## 2013-12-23 ENCOUNTER — Ambulatory Visit (INDEPENDENT_AMBULATORY_CARE_PROVIDER_SITE_OTHER): Payer: Medicare Other | Admitting: Urology

## 2013-12-23 DIAGNOSIS — R339 Retention of urine, unspecified: Secondary | ICD-10-CM

## 2013-12-23 DIAGNOSIS — N401 Enlarged prostate with lower urinary tract symptoms: Secondary | ICD-10-CM | POA: Diagnosis not present

## 2013-12-23 DIAGNOSIS — N138 Other obstructive and reflux uropathy: Secondary | ICD-10-CM | POA: Diagnosis not present

## 2014-01-06 ENCOUNTER — Ambulatory Visit (INDEPENDENT_AMBULATORY_CARE_PROVIDER_SITE_OTHER): Payer: Medicare Other | Admitting: Pharmacist Clinician (PhC)/ Clinical Pharmacy Specialist

## 2014-01-06 DIAGNOSIS — Z961 Presence of intraocular lens: Secondary | ICD-10-CM | POA: Diagnosis not present

## 2014-01-06 DIAGNOSIS — Z7901 Long term (current) use of anticoagulants: Secondary | ICD-10-CM

## 2014-01-06 DIAGNOSIS — H40003 Preglaucoma, unspecified, bilateral: Secondary | ICD-10-CM | POA: Diagnosis not present

## 2014-01-06 DIAGNOSIS — Z9849 Cataract extraction status, unspecified eye: Secondary | ICD-10-CM | POA: Diagnosis not present

## 2014-01-06 DIAGNOSIS — H40009 Preglaucoma, unspecified, unspecified eye: Secondary | ICD-10-CM | POA: Diagnosis not present

## 2014-01-06 DIAGNOSIS — H40019 Open angle with borderline findings, low risk, unspecified eye: Secondary | ICD-10-CM | POA: Diagnosis not present

## 2014-01-06 DIAGNOSIS — I4891 Unspecified atrial fibrillation: Secondary | ICD-10-CM

## 2014-01-06 LAB — POCT INR: INR: 3

## 2014-01-16 DIAGNOSIS — M791 Myalgia: Secondary | ICD-10-CM | POA: Diagnosis not present

## 2014-01-17 ENCOUNTER — Other Ambulatory Visit: Payer: Self-pay | Admitting: Internal Medicine

## 2014-01-24 DIAGNOSIS — M2042 Other hammer toe(s) (acquired), left foot: Secondary | ICD-10-CM | POA: Diagnosis not present

## 2014-01-24 DIAGNOSIS — M2012 Hallux valgus (acquired), left foot: Secondary | ICD-10-CM | POA: Diagnosis not present

## 2014-02-03 ENCOUNTER — Ambulatory Visit (INDEPENDENT_AMBULATORY_CARE_PROVIDER_SITE_OTHER): Payer: Medicare Other | Admitting: Pharmacist Clinician (PhC)/ Clinical Pharmacy Specialist

## 2014-02-03 DIAGNOSIS — I4891 Unspecified atrial fibrillation: Secondary | ICD-10-CM

## 2014-02-03 DIAGNOSIS — Z7901 Long term (current) use of anticoagulants: Secondary | ICD-10-CM | POA: Diagnosis not present

## 2014-02-03 LAB — POCT INR: INR: 1.4

## 2014-02-15 ENCOUNTER — Ambulatory Visit (INDEPENDENT_AMBULATORY_CARE_PROVIDER_SITE_OTHER): Payer: Medicare Other | Admitting: *Deleted

## 2014-02-15 ENCOUNTER — Telehealth: Payer: Self-pay | Admitting: Cardiovascular Disease

## 2014-02-15 DIAGNOSIS — I4891 Unspecified atrial fibrillation: Secondary | ICD-10-CM

## 2014-02-15 DIAGNOSIS — Z7901 Long term (current) use of anticoagulants: Secondary | ICD-10-CM

## 2014-02-15 LAB — POCT INR: INR: 2.7

## 2014-02-15 NOTE — Telephone Encounter (Signed)
Spoke with Jerry Cain in [place]. Patient told her he was supposed to go there for labs, per [place]. He has a coumadin check at the [facility] office to establish coumadin checks there today at 12:30. She will communicate this to the patient.

## 2014-03-04 ENCOUNTER — Other Ambulatory Visit: Payer: Self-pay | Admitting: Pharmacist Clinician (PhC)/ Clinical Pharmacy Specialist

## 2014-03-07 ENCOUNTER — Encounter (HOSPITAL_COMMUNITY): Payer: Self-pay | Admitting: Emergency Medicine

## 2014-03-07 ENCOUNTER — Emergency Department (HOSPITAL_COMMUNITY): Payer: Medicare Other

## 2014-03-07 ENCOUNTER — Emergency Department (HOSPITAL_COMMUNITY)
Admission: EM | Admit: 2014-03-07 | Discharge: 2014-03-07 | Disposition: A | Discharge status: Home / Self Care | Payer: Medicare Other | Attending: Emergency Medicine | Admitting: Emergency Medicine

## 2014-03-07 DIAGNOSIS — Z23 Encounter for immunization: Secondary | ICD-10-CM | POA: Diagnosis not present

## 2014-03-07 DIAGNOSIS — Z8659 Personal history of other mental and behavioral disorders: Secondary | ICD-10-CM | POA: Insufficient documentation

## 2014-03-07 DIAGNOSIS — Z7901 Long term (current) use of anticoagulants: Secondary | ICD-10-CM | POA: Insufficient documentation

## 2014-03-07 DIAGNOSIS — I2510 Atherosclerotic heart disease of native coronary artery without angina pectoris: Secondary | ICD-10-CM | POA: Diagnosis not present

## 2014-03-07 DIAGNOSIS — Z9861 Coronary angioplasty status: Secondary | ICD-10-CM | POA: Insufficient documentation

## 2014-03-07 DIAGNOSIS — Z79899 Other long term (current) drug therapy: Secondary | ICD-10-CM | POA: Diagnosis not present

## 2014-03-07 DIAGNOSIS — Z8669 Personal history of other diseases of the nervous system and sense organs: Secondary | ICD-10-CM | POA: Insufficient documentation

## 2014-03-07 DIAGNOSIS — Z8701 Personal history of pneumonia (recurrent): Secondary | ICD-10-CM | POA: Insufficient documentation

## 2014-03-07 DIAGNOSIS — K219 Gastro-esophageal reflux disease without esophagitis: Secondary | ICD-10-CM | POA: Diagnosis not present

## 2014-03-07 DIAGNOSIS — Z7982 Long term (current) use of aspirin: Secondary | ICD-10-CM | POA: Insufficient documentation

## 2014-03-07 DIAGNOSIS — Z86018 Personal history of other benign neoplasm: Secondary | ICD-10-CM | POA: Insufficient documentation

## 2014-03-07 DIAGNOSIS — Z86010 Personal history of colonic polyps: Secondary | ICD-10-CM | POA: Insufficient documentation

## 2014-03-07 DIAGNOSIS — N400 Enlarged prostate without lower urinary tract symptoms: Secondary | ICD-10-CM | POA: Diagnosis not present

## 2014-03-07 DIAGNOSIS — E780 Pure hypercholesterolemia: Secondary | ICD-10-CM | POA: Insufficient documentation

## 2014-03-07 DIAGNOSIS — I10 Essential (primary) hypertension: Secondary | ICD-10-CM | POA: Insufficient documentation

## 2014-03-07 DIAGNOSIS — I160 Hypertensive urgency: Secondary | ICD-10-CM

## 2014-03-07 DIAGNOSIS — R0789 Other chest pain: Secondary | ICD-10-CM

## 2014-03-07 DIAGNOSIS — Z880 Allergy status to penicillin: Secondary | ICD-10-CM | POA: Insufficient documentation

## 2014-03-07 DIAGNOSIS — E039 Hypothyroidism, unspecified: Secondary | ICD-10-CM | POA: Insufficient documentation

## 2014-03-07 DIAGNOSIS — I4891 Unspecified atrial fibrillation: Secondary | ICD-10-CM | POA: Insufficient documentation

## 2014-03-07 LAB — TROPONIN I

## 2014-03-07 LAB — COMPREHENSIVE METABOLIC PANEL
ALBUMIN: 3.7 g/dL (ref 3.5–5.2)
ALT: 25 U/L (ref 0–53)
ANION GAP: 10 (ref 5–15)
AST: 28 U/L (ref 0–37)
Alkaline Phosphatase: 66 U/L (ref 39–117)
BUN: 19 mg/dL (ref 6–23)
CALCIUM: 9 mg/dL (ref 8.4–10.5)
CO2: 28 mEq/L (ref 19–32)
CREATININE: 0.94 mg/dL (ref 0.50–1.35)
Chloride: 101 mEq/L (ref 96–112)
GFR calc Af Amer: 89 mL/min — ABNORMAL LOW (ref >90)
GFR calc non Af Amer: 77 mL/min — ABNORMAL LOW (ref >90)
Glucose, Bld: 93 mg/dL (ref 70–99)
Potassium: 3.6 mEq/L — ABNORMAL LOW (ref 3.7–5.3)
Sodium: 139 mEq/L (ref 137–147)
TOTAL PROTEIN: 6.6 g/dL (ref 6.0–8.3)
Total Bilirubin: 0.7 mg/dL (ref 0.3–1.2)

## 2014-03-07 LAB — CBC WITH DIFFERENTIAL/PLATELET
Basophils Absolute: 0 10*3/uL (ref 0.0–0.1)
Basophils Relative: 0 % (ref 0–1)
Eosinophils Absolute: 0.3 10*3/uL (ref 0.0–0.7)
Eosinophils Relative: 6 % — ABNORMAL HIGH (ref 0–5)
HCT: 36.7 % — ABNORMAL LOW (ref 39.0–52.0)
HEMOGLOBIN: 12.4 g/dL — AB (ref 13.0–17.0)
LYMPHS ABS: 1.5 10*3/uL (ref 0.7–4.0)
LYMPHS PCT: 26 % (ref 12–46)
MCH: 30.5 pg (ref 26.0–34.0)
MCHC: 33.8 g/dL (ref 30.0–36.0)
MCV: 90.2 fL (ref 78.0–100.0)
MONOS PCT: 12 % (ref 3–12)
Monocytes Absolute: 0.7 10*3/uL (ref 0.1–1.0)
NEUTROS PCT: 56 % (ref 43–77)
Neutro Abs: 3.2 10*3/uL (ref 1.7–7.7)
Platelets: 143 10*3/uL — ABNORMAL LOW (ref 150–400)
RBC: 4.07 MIL/uL — AB (ref 4.22–5.81)
RDW: 14.9 % (ref 11.5–15.5)
WBC: 5.7 10*3/uL (ref 4.0–10.5)

## 2014-03-07 NOTE — ED Provider Notes (Signed)
CSN: [id number]     Arrival date & time 03/07/14  2152 History  This chart was scribed for Veryl Speak, MD by Delphia Grates, ED Scribe. This patient was seen in room [facility] and the patient's care was started at 10:34 PM.     Chief Complaint  Patient presents with  . Hypertension    The history is provided by the patient. No language interpreter was used.     HPI Comments: Jerry Cain is a 80 y.o. male, with history of CAD, A-fib, and HTN who presents to the Emergency Department for elevated blood pressure since this afternoon. Patient states he has not been drinking water as he should and reports this is the cause of his high blood pressure. He reports a BP of 211/99 that was measured at [facility] (BP at triage 176/78). Patient also states he received the flu vaccine prior to onset of symptoms. He reports associated intermittent left sided chest discomfort. He reports the episodes occur at random and last approximately 30 seconds. Patient reports he had stents placed in 2009. He reports history of the same, stating that the episodes occur when he does not drink any fluids. Patient is currently on BP medication, and states that he is compliant. Patient denies any pain or other symptoms at present.   Past Medical History  Diagnosis Date  . Hypercholesterolemia     takes Atorvastatin daily  . Gout   . BPH (benign prostatic hyperplasia)     takes Proscar daily  . CAD (coronary artery disease) 2009    cardiac cath and PTCA by Dr. Tami Ribas  . Tubular adenoma 2001  . Diverticulosis 2007    tcs by Dr. Laural Golden  . GERD (gastroesophageal reflux disease)   . Adenomatous colon polyp 2001  . Osteoarthritis     left knee  . Complication of anesthesia     hard to wake up  . Hypertension     takes Imdur,Coreg,and Lisinopril daily  . Atrial fibrillation     takes Coumadin daily  . Pneumonia     many,many yrs ago  . Numbness     right hand pointer and middle finger  . Carpal tunnel  syndrome     right  . Joint pain   . Joint swelling   . Nocturia   . Hypothyroidism     takes SYnthroid daily  . Anxiety    Past Surgical History  Procedure Laterality Date  . Hernia repair  2009    left inguinal  . Cataract extraction      bilateral  . Colonoscopy  2007    Dr. Laural Golden- L sided diverticulosis. Next TCS 2012 due to h/o tubular adenoma.  . Colonoscopy  02/26/2011    Procedure: COLONOSCOPY;  Surgeon: Daneil Dolin, MD;  [facility];  Service: Endoscopy;  Laterality: N/A;  11:05  . Tee without cardioversion  12/12/2011    Procedure: TRANSESOPHAGEAL ECHOCARDIOGRAM (TEE);  Surgeon: Sanda Klein, MD;  Location: [facility] ENDOSCOPY;  Service: Cardiovascular;  Laterality: N/A;   successful/sinus rhythm  . Cardioversion  12/12/2011    Procedure: CARDIOVERSION;  Surgeon: Sanda Klein, MD;  Location: [place];  Service: Cardiovascular;  Laterality: N/A;  . Bunionectomy Right 03/17/2013    Procedure: Lillard Anes, Arthroplasty 2nd toe right foot;  Surgeon: Marcheta Grammes, DPM;  Location: [facility];  Service: Orthopedics;  Laterality: Right;  . Aiken osteotomy Right 03/17/2013    Procedure: Barbie Banner OSTEOTOMY;  Surgeon: Marcheta Grammes, DPM;  Location: [facility];  Service: Orthopedics;  Laterality: Right;  . Flexor tenotomy Left 03/17/2013    Procedure: PERCUTANEOUS FLEXOR TENOTOMY 3RD TOE LEFT FOOT;  Surgeon: Marcheta Grammes, DPM;  Location[facility];  Service: Orthopedics;  Laterality: Left;  . Cardiac catheterization  06/11/2007    PTCA X2 in 06/2007:  2 overlapping Cypher stents to LAD (2.5x13 and 3.0x23)  . Cardiac catheterization  06/21/2007    Cypher stent 2.5 x 13  to OM branch  . Coronary angioplasty      x 3   . Total knee arthroplasty Left 06/14/2013    Procedure: TOTAL KNEE ARTHROPLASTY;  Surgeon: Garald Balding, MD;  Location: [place];  Service: Orthopedics;  Laterality: Left;   Family History  Problem Relation Age of Onset  . Colon cancer  Neg Hx   . Pneumonia Father   . Heart disease Brother     open heart surgery at age 26   History  Substance Use Topics  . Smoking status: Never Smoker   . Smokeless tobacco: Not on file  . Alcohol Use: No    Review of Systems  A complete 10 system review of systems was obtained and all systems are negative except as noted in the HPI and PMH.    Allergies  Penicillins  Home Medications   Prior to Admission medications   Medication Sig Start Date End Date Taking? Authorizing Provider  aspirin 81 MG tablet Take 81 mg by mouth every morning.     Historical Provider, MD  atorvastatin (LIPITOR) 40 MG tablet TAKE (1) TABLET BY MOUTH AT BEDTIME.(8PM)    Sanda Klein, MD  Calcium Carbonate-Vitamin D (CALCIUM + D) 600-200 MG-UNIT TABS Take 1 tablet by mouth 2 (two) times daily.     Historical Provider, MD  carvedilol (COREG) 6.25 MG tablet Take 1 tablet (6.25 mg total) by mouth 2 (two) times daily. 05/13/13   Mihai Croitoru, MD  finasteride (PROSCAR) 5 MG tablet Take 5 mg by mouth every morning.  01/28/11   Historical Provider, MD  Glucosamine 750 MG TABS Take 750 mg by mouth 2 (two) times daily.    Historical Provider, MD  isosorbide mononitrate (IMDUR) 30 MG 24 hr tablet TAKE ONE TABLET ONCE DAILY IN THE MORNING. 12/07/13   Mihai Croitoru, MD  levothyroxine (SYNTHROID, LEVOTHROID) 100 MCG tablet Take 100 mcg by mouth daily before breakfast.    Historical Provider, MD  lisinopril (PRINIVIL,ZESTRIL) 20 MG tablet TAKE ONE TABLET BY MOUTH AT BEDTIME (8PM). 01/17/14   Pixie Casino, MD  Multiple Vitamin (MULTIVITAMIN) capsule Take 1 capsule by mouth every morning.     Historical Provider, MD  nitroGLYCERIN (NITROSTAT) 0.4 MG SL tablet Place 1 tablet (0.4 mg total) under the tongue every 5 (five) minutes as needed. 12/27/12   Lorretta Harp, MD  tamsulosin (FLOMAX) 0.4 MG CAPS capsule Take 0.4 mg by mouth at bedtime.  03/01/13   Historical Provider, MD  warfarin (COUMADIN) 5 MG tablet Take 1  tablet by mouth daily or as directed by coumadin clinic 03/07/14   Sanda Klein, MD   Triage Vitals: BP 176/78 mmHg  Pulse 59  Temp(Src) 98 F (36.7 C) (Oral)  Resp 16  Ht 6' (1.829 m)  Wt 185 lb (83.915 kg)  BMI 25.08 kg/m2  SpO2 100%  Physical Exam  Constitutional: He is oriented to person, place, and time. He appears well-developed and well-nourished. No distress.  HENT:  Head: Normocephalic and atraumatic.  Eyes: Conjunctivae and EOM are  normal.  Neck: Neck supple. No tracheal deviation present.  Cardiovascular: Normal rate, regular rhythm and normal heart sounds.   Pulmonary/Chest: Effort normal and breath sounds normal. No respiratory distress.  Musculoskeletal: Normal range of motion.  Neurological: He is alert and oriented to person, place, and time.  Skin: Skin is warm and dry.  Psychiatric: He has a normal mood and affect. His behavior is normal.  Nursing note and vitals reviewed.   ED Course  Procedures (including critical care time)  DIAGNOSTIC STUDIES: Oxygen Saturation is 100% on room air, normal by my interpretation.    COORDINATION OF CARE: At 2241 Discussed treatment plan with patient which includes CXR and labs. Patient agrees.   Labs Review Labs Reviewed  COMPREHENSIVE METABOLIC PANEL  TROPONIN I  CBC WITH DIFFERENTIAL    Imaging Review No results found.   EKG Interpretation   Date/Time:  Tuesday March 07 2014 22:36:17 EST Ventricular Rate:  60 PR Interval:    QRS Duration: 122 QT Interval:  438 QTC Calculation: 438 R Axis:   -59 Text Interpretation:  Junctional rhythm RBBB and LAFB Confirmed by DELOS   MD, Aivah Putman ([id number]) on 03/07/2014 10:40:33 PM      MDM   Final diagnoses:  None    Patient is an 80 year old male who presents with complaints of left-sided chest discomfort and elevated blood pressure. He received his flu shot today and his blood pressures been elevated since that time. He was getting readings at home of 465  systolic, however he was 681 in triage and his blood pressure is now 275 systolic. He is feeling significantly better and his workup is unremarkable. He has no EKG changes and negative troponin with 8 hours of intermittent atypical left upper chest pain. He is requesting to go home and I feel as though this is an appropriate course of action. Hand or stands to return if his symptoms significantly worsen or change.  I personally performed the services described in this documentation, which was scribed in my presence. The recorded information has been reviewed and is accurate.      Veryl Speak, MD 03/07/14 [phone]

## 2014-03-07 NOTE — ED Notes (Signed)
Patient reports noticed blood pressure has been elevated since this afternoon. States received flu vaccine today.

## 2014-03-07 NOTE — ED Notes (Signed)
Patient also reports intermittent left chest pain. States pain has been coming and going since this afternoon.

## 2014-03-07 NOTE — Discharge Instructions (Signed)
Keep a close eye on your blood pressures and return to the emergency department if your symptoms substantially worsen or change.   Hypertension Hypertension, commonly called high blood pressure, is when the force of blood pumping through your arteries is too strong. Your arteries are the blood vessels that carry blood from your heart throughout your body. A blood pressure reading consists of a higher number over a lower number, such as 110/72. The higher number (systolic) is the pressure inside your arteries when your heart pumps. The lower number (diastolic) is the pressure inside your arteries when your heart relaxes. Ideally you want your blood pressure below 120/80. Hypertension forces your heart to work harder to pump blood. Your arteries may become narrow or stiff. Having hypertension puts you at risk for heart disease, stroke, and other problems.  RISK FACTORS Some risk factors for high blood pressure are controllable. Others are not.  Risk factors you cannot control include:   Race. You may be at higher risk if you are African American.  Age. Risk increases with age.  Gender. Men are at higher risk than women before age 13 years. After age 4, women are at higher risk than men. Risk factors you can control include:  Not getting enough exercise or physical activity.  Being overweight.  Getting too much fat, sugar, calories, or salt in your diet.  Drinking too much alcohol. SIGNS AND SYMPTOMS Hypertension does not usually cause signs or symptoms. Extremely high blood pressure (hypertensive crisis) may cause headache, anxiety, shortness of breath, and nosebleed. DIAGNOSIS  To check if you have hypertension, your health care provider will measure your blood pressure while you are seated, with your arm held at the level of your heart. It should be measured at least twice using the same arm. Certain conditions can cause a difference in blood pressure between your right and left arms. A  blood pressure reading that is higher than normal on one occasion does not mean that you need treatment. If one blood pressure reading is high, ask your health care provider about having it checked again. TREATMENT  Treating high blood pressure includes making lifestyle changes and possibly taking medicine. Living a healthy lifestyle can help lower high blood pressure. You may need to change some of your habits. Lifestyle changes may include:  Following the DASH diet. This diet is high in fruits, vegetables, and whole grains. It is low in salt, red meat, and added sugars.  Getting at least 2 hours of brisk physical activity every week.  Losing weight if necessary.  Not smoking.  Limiting alcoholic beverages.  Learning ways to reduce stress. If lifestyle changes are not enough to get your blood pressure under control, your health care provider may prescribe medicine. You may need to take more than one. Work closely with your health care provider to understand the risks and benefits. HOME CARE INSTRUCTIONS  Have your blood pressure rechecked as directed by your health care provider.   Take medicines only as directed by your health care provider. Follow the directions carefully. Blood pressure medicines must be taken as prescribed. The medicine does not work as well when you skip doses. Skipping doses also puts you at risk for problems.   Do not smoke.   Monitor your blood pressure at home as directed by your health care provider. SEEK MEDICAL CARE IF:   You think you are having a reaction to medicines taken.  You have recurrent headaches or feel dizzy.  You have swelling  in your ankles.  You have trouble with your vision. SEEK IMMEDIATE MEDICAL CARE IF:  You develop a severe headache or confusion.  You have unusual weakness, numbness, or feel faint.  You have severe chest or abdominal pain.  You vomit repeatedly.  You have trouble breathing. MAKE SURE YOU:    Understand these instructions.  Will watch your condition.  Will get help right away if you are not doing well or get worse. Document Released: 03/24/2005 Document Revised: 08/08/2013 Document Reviewed: 01/14/2013 [facility] Patient Information 2015 [place], [place]. This information is not intended to replace advice given to you by your health care provider. Make sure you discuss any questions you have with your health care provider.

## 2014-03-14 DIAGNOSIS — Z23 Encounter for immunization: Secondary | ICD-10-CM | POA: Diagnosis not present

## 2014-03-15 ENCOUNTER — Ambulatory Visit (INDEPENDENT_AMBULATORY_CARE_PROVIDER_SITE_OTHER): Payer: Medicare Other | Admitting: *Deleted

## 2014-03-15 DIAGNOSIS — I4891 Unspecified atrial fibrillation: Secondary | ICD-10-CM

## 2014-03-15 DIAGNOSIS — Z7901 Long term (current) use of anticoagulants: Secondary | ICD-10-CM | POA: Diagnosis not present

## 2014-03-15 LAB — POCT INR: INR: 2

## 2014-04-12 ENCOUNTER — Ambulatory Visit (INDEPENDENT_AMBULATORY_CARE_PROVIDER_SITE_OTHER): Payer: Medicare Other | Admitting: *Deleted

## 2014-04-12 DIAGNOSIS — I4891 Unspecified atrial fibrillation: Secondary | ICD-10-CM

## 2014-04-12 DIAGNOSIS — Z7901 Long term (current) use of anticoagulants: Secondary | ICD-10-CM

## 2014-04-12 LAB — POCT INR: INR: 2

## 2014-05-05 ENCOUNTER — Other Ambulatory Visit (HOSPITAL_COMMUNITY): Payer: Self-pay | Admitting: Family Medicine

## 2014-05-05 ENCOUNTER — Ambulatory Visit (HOSPITAL_COMMUNITY)
Admission: RE | Admit: 2014-05-05 | Discharge: 2014-05-05 | Disposition: A | Discharge status: Home / Self Care | Payer: Medicare Other | Source: Ambulatory Visit | Attending: Family Medicine | Admitting: Family Medicine

## 2014-05-05 DIAGNOSIS — M545 Low back pain: Secondary | ICD-10-CM

## 2014-05-05 DIAGNOSIS — Z888 Allergy status to other drugs, medicaments and biological substances status: Secondary | ICD-10-CM | POA: Diagnosis not present

## 2014-05-05 DIAGNOSIS — M5442 Lumbago with sciatica, left side: Secondary | ICD-10-CM | POA: Diagnosis not present

## 2014-05-05 DIAGNOSIS — M5136 Other intervertebral disc degeneration, lumbar region: Secondary | ICD-10-CM | POA: Diagnosis not present

## 2014-05-05 DIAGNOSIS — M47816 Spondylosis without myelopathy or radiculopathy, lumbar region: Secondary | ICD-10-CM | POA: Diagnosis not present

## 2014-05-05 DIAGNOSIS — M5441 Lumbago with sciatica, right side: Secondary | ICD-10-CM | POA: Diagnosis not present

## 2014-05-10 ENCOUNTER — Ambulatory Visit (INDEPENDENT_AMBULATORY_CARE_PROVIDER_SITE_OTHER): Payer: Medicare Other | Admitting: *Deleted

## 2014-05-10 DIAGNOSIS — M5441 Lumbago with sciatica, right side: Secondary | ICD-10-CM | POA: Diagnosis not present

## 2014-05-10 DIAGNOSIS — Z7901 Long term (current) use of anticoagulants: Secondary | ICD-10-CM | POA: Diagnosis not present

## 2014-05-10 DIAGNOSIS — M5442 Lumbago with sciatica, left side: Secondary | ICD-10-CM | POA: Diagnosis not present

## 2014-05-10 DIAGNOSIS — I4891 Unspecified atrial fibrillation: Secondary | ICD-10-CM

## 2014-05-10 DIAGNOSIS — S22080S Wedge compression fracture of T11-T12 vertebra, sequela: Secondary | ICD-10-CM | POA: Diagnosis not present

## 2014-05-10 DIAGNOSIS — M5136 Other intervertebral disc degeneration, lumbar region: Secondary | ICD-10-CM | POA: Diagnosis not present

## 2014-05-10 LAB — POCT INR: INR: 1.7

## 2014-05-19 ENCOUNTER — Other Ambulatory Visit: Payer: Self-pay | Admitting: Cardiovascular Disease

## 2014-05-26 DIAGNOSIS — M5440 Lumbago with sciatica, unspecified side: Secondary | ICD-10-CM | POA: Diagnosis not present

## 2014-05-31 ENCOUNTER — Other Ambulatory Visit: Payer: Self-pay

## 2014-05-31 ENCOUNTER — Ambulatory Visit (INDEPENDENT_AMBULATORY_CARE_PROVIDER_SITE_OTHER): Payer: Medicare Other | Admitting: *Deleted

## 2014-05-31 DIAGNOSIS — I4891 Unspecified atrial fibrillation: Secondary | ICD-10-CM | POA: Diagnosis not present

## 2014-05-31 DIAGNOSIS — Z7901 Long term (current) use of anticoagulants: Secondary | ICD-10-CM

## 2014-05-31 LAB — POCT INR: INR: 3.7

## 2014-05-31 MED ORDER — CARVEDILOL 6.25 MG PO TABS: 6.2500 mg | ORAL_TABLET | Freq: Two times a day (BID) | ORAL | Status: DC

## 2014-05-31 NOTE — Telephone Encounter (Signed)
Rx(s) sent to pharmacy electronically.  

## 2014-06-21 ENCOUNTER — Ambulatory Visit (INDEPENDENT_AMBULATORY_CARE_PROVIDER_SITE_OTHER): Payer: Medicare Other | Admitting: *Deleted

## 2014-06-21 DIAGNOSIS — Z7901 Long term (current) use of anticoagulants: Secondary | ICD-10-CM | POA: Diagnosis not present

## 2014-06-21 DIAGNOSIS — I4891 Unspecified atrial fibrillation: Secondary | ICD-10-CM | POA: Diagnosis not present

## 2014-06-21 LAB — POCT INR: INR: 3.7

## 2014-06-27 DIAGNOSIS — M1711 Unilateral primary osteoarthritis, right knee: Secondary | ICD-10-CM | POA: Diagnosis not present

## 2014-06-27 DIAGNOSIS — M1712 Unilateral primary osteoarthritis, left knee: Secondary | ICD-10-CM | POA: Diagnosis not present

## 2014-06-27 DIAGNOSIS — Z96652 Presence of left artificial knee joint: Secondary | ICD-10-CM | POA: Diagnosis not present

## 2014-07-10 ENCOUNTER — Telehealth: Payer: Self-pay | Admitting: Cardiovascular Disease

## 2014-07-10 NOTE — Telephone Encounter (Signed)
Jerry Cain is calling because he is still muscle pain and soreness . Thinks it has something to do with the medication Atorvastatin . Please call    Thanks

## 2014-07-10 NOTE — Telephone Encounter (Signed)
Call on Cell which is [phone]

## 2014-07-10 NOTE — Telephone Encounter (Signed)
I spoke with patient.  He reports muscle soreness and pain in his arm and back muscles.  It started in October when he had to stop swimming due to the discomfort.  It has now gotten to the point that he has to have help putting on his jacket.  He talked to the pharmacist at [facility] and he suggested that it was his atorvastatin.  I advised patient to hold atorvastatin for 2 weeks and then call us back with results.  He verbalized understanding.

## 2014-07-12 ENCOUNTER — Ambulatory Visit (INDEPENDENT_AMBULATORY_CARE_PROVIDER_SITE_OTHER): Payer: Medicare Other | Admitting: *Deleted

## 2014-07-12 DIAGNOSIS — I4891 Unspecified atrial fibrillation: Secondary | ICD-10-CM | POA: Diagnosis not present

## 2014-07-12 DIAGNOSIS — Z7901 Long term (current) use of anticoagulants: Secondary | ICD-10-CM

## 2014-07-12 LAB — POCT INR: INR: 2.4

## 2014-07-24 ENCOUNTER — Telehealth: Payer: Self-pay | Admitting: Cardiovascular Disease

## 2014-07-24 NOTE — Telephone Encounter (Signed)
Patient notified to hold atorvastatin an additional 2 weeks. He voiced understanding

## 2014-07-24 NOTE — Telephone Encounter (Signed)
Pt stopped his Atorvastatin for 2 weeks. Pt pain stopped in his legs and muscles. Shoulders are still botherng him,he is going to see Dr Joni Fears on Wednesday.He was suppose to call and let you know how he was doiig after the two weeks.If he is not at [facility] call [phone].

## 2014-07-24 NOTE — Telephone Encounter (Signed)
[name], RN at 07/10/2014 4:36 PM     Status: Signed       Expand All Collapse All   I spoke with patient. He reports muscle soreness and pain in his arm and back muscles. It started in October when he had to stop swimming due to the discomfort. It has now gotten to the point that he has to have help putting on his jacket. He talked to the pharmacist at [facility] and he suggested that it was his atorvastatin. I advised patient to hold atorvastatin for 2 weeks and then call us back with results. He verbalized understanding.             [name] at 07/10/2014 4:19 PM     Status: Signed       Expand All Collapse All   Call on Cell which is [phone]            [name] at 07/10/2014 4:15 PM     Status: Signed       Expand All Collapse All   [name] is calling because he is still muscle pain and soreness . Thinks it has something to do with the medication Atorvastatin . Please call    Thanks        Patient states his pain/aches have gone away since he has not taken atorvastatin in 2 weeks. but he is going to see an orthopaedist on Wednesday about his shoulder. He would like to know what he should do regarding his cholesterol medication.

## 2014-07-24 NOTE — Telephone Encounter (Signed)
I would even stop for a whole month to see if benefit is sustained. If pain no better in one month, resume atorvastatin

## 2014-07-26 DIAGNOSIS — M1712 Unilateral primary osteoarthritis, left knee: Secondary | ICD-10-CM | POA: Diagnosis not present

## 2014-07-26 DIAGNOSIS — M542 Cervicalgia: Secondary | ICD-10-CM | POA: Diagnosis not present

## 2014-07-26 DIAGNOSIS — M7552 Bursitis of left shoulder: Secondary | ICD-10-CM | POA: Diagnosis not present

## 2014-08-08 ENCOUNTER — Telehealth: Payer: Self-pay | Admitting: Cardiovascular Disease

## 2014-08-08 DIAGNOSIS — E785 Hyperlipidemia, unspecified: Secondary | ICD-10-CM

## 2014-08-08 MED ORDER — ROSUVASTATIN CALCIUM 10 MG PO TABS: ORAL_TABLET | ORAL | Status: DC

## 2014-08-08 NOTE — Telephone Encounter (Signed)
Please call,concerning his medication.

## 2014-08-08 NOTE — Telephone Encounter (Signed)
Returned call to patient Dr.Croitoru advised to take Crestor 10 mg once a week.Advised to check lipids in 3 months.Lab order mailed.Advised to call back if he has any muscle pain.

## 2014-08-08 NOTE — Telephone Encounter (Signed)
Please tell him I would like to try Crestor 10 mg once a week and recheck lipids in 3 months.

## 2014-08-08 NOTE — Telephone Encounter (Signed)
Returned call to patient he stated he has been off crestor for the past 4 weeks.Stated no more muscle pain.Message sent to Dr.Croitoru for advice.

## 2014-08-09 ENCOUNTER — Ambulatory Visit (INDEPENDENT_AMBULATORY_CARE_PROVIDER_SITE_OTHER): Payer: Medicare Other | Admitting: *Deleted

## 2014-08-09 DIAGNOSIS — I4891 Unspecified atrial fibrillation: Secondary | ICD-10-CM | POA: Diagnosis not present

## 2014-08-09 DIAGNOSIS — Z7901 Long term (current) use of anticoagulants: Secondary | ICD-10-CM

## 2014-08-09 LAB — POCT INR: INR: 1.8

## 2014-08-10 DIAGNOSIS — E039 Hypothyroidism, unspecified: Secondary | ICD-10-CM | POA: Diagnosis not present

## 2014-08-10 DIAGNOSIS — I10 Essential (primary) hypertension: Secondary | ICD-10-CM | POA: Diagnosis not present

## 2014-08-10 DIAGNOSIS — E785 Hyperlipidemia, unspecified: Secondary | ICD-10-CM | POA: Diagnosis not present

## 2014-08-10 DIAGNOSIS — Z79899 Other long term (current) drug therapy: Secondary | ICD-10-CM | POA: Diagnosis not present

## 2014-08-30 ENCOUNTER — Ambulatory Visit (INDEPENDENT_AMBULATORY_CARE_PROVIDER_SITE_OTHER): Payer: Medicare Other | Admitting: *Deleted

## 2014-08-30 DIAGNOSIS — Z7901 Long term (current) use of anticoagulants: Secondary | ICD-10-CM | POA: Diagnosis not present

## 2014-08-30 DIAGNOSIS — I4891 Unspecified atrial fibrillation: Secondary | ICD-10-CM

## 2014-08-30 LAB — POCT INR: INR: 2.6

## 2014-09-06 DIAGNOSIS — M79641 Pain in right hand: Secondary | ICD-10-CM | POA: Diagnosis not present

## 2014-09-13 ENCOUNTER — Other Ambulatory Visit (HOSPITAL_COMMUNITY): Payer: Self-pay | Admitting: Orthopaedic Surgery

## 2014-09-13 DIAGNOSIS — M7551 Bursitis of right shoulder: Secondary | ICD-10-CM | POA: Diagnosis not present

## 2014-09-13 DIAGNOSIS — M25512 Pain in left shoulder: Secondary | ICD-10-CM

## 2014-09-13 DIAGNOSIS — M25612 Stiffness of left shoulder, not elsewhere classified: Secondary | ICD-10-CM

## 2014-09-13 DIAGNOSIS — G5602 Carpal tunnel syndrome, left upper limb: Secondary | ICD-10-CM | POA: Diagnosis not present

## 2014-09-13 DIAGNOSIS — M7552 Bursitis of left shoulder: Secondary | ICD-10-CM | POA: Diagnosis not present

## 2014-09-13 DIAGNOSIS — G5601 Carpal tunnel syndrome, right upper limb: Secondary | ICD-10-CM | POA: Diagnosis not present

## 2014-09-25 ENCOUNTER — Ambulatory Visit (HOSPITAL_COMMUNITY)
Admission: RE | Admit: 2014-09-25 | Discharge: 2014-09-25 | Disposition: A | Discharge status: Home / Self Care | Payer: Medicare Other | Source: Ambulatory Visit | Attending: Orthopaedic Surgery | Admitting: Orthopaedic Surgery

## 2014-09-25 DIAGNOSIS — M25512 Pain in left shoulder: Secondary | ICD-10-CM | POA: Insufficient documentation

## 2014-09-25 DIAGNOSIS — M19012 Primary osteoarthritis, left shoulder: Secondary | ICD-10-CM | POA: Diagnosis not present

## 2014-09-25 DIAGNOSIS — M25612 Stiffness of left shoulder, not elsewhere classified: Secondary | ICD-10-CM

## 2014-09-25 DIAGNOSIS — M7582 Other shoulder lesions, left shoulder: Secondary | ICD-10-CM | POA: Insufficient documentation

## 2014-09-25 DIAGNOSIS — M779 Enthesopathy, unspecified: Secondary | ICD-10-CM | POA: Diagnosis not present

## 2014-09-27 ENCOUNTER — Ambulatory Visit (INDEPENDENT_AMBULATORY_CARE_PROVIDER_SITE_OTHER): Payer: Medicare Other | Admitting: *Deleted

## 2014-09-27 DIAGNOSIS — I4891 Unspecified atrial fibrillation: Secondary | ICD-10-CM | POA: Diagnosis not present

## 2014-09-27 DIAGNOSIS — M25512 Pain in left shoulder: Secondary | ICD-10-CM | POA: Diagnosis not present

## 2014-09-27 DIAGNOSIS — G5602 Carpal tunnel syndrome, left upper limb: Secondary | ICD-10-CM | POA: Diagnosis not present

## 2014-09-27 DIAGNOSIS — M25511 Pain in right shoulder: Secondary | ICD-10-CM | POA: Diagnosis not present

## 2014-09-27 DIAGNOSIS — Z7901 Long term (current) use of anticoagulants: Secondary | ICD-10-CM

## 2014-09-27 LAB — POCT INR: INR: 3

## 2014-10-10 ENCOUNTER — Telehealth: Payer: Self-pay | Admitting: Cardiovascular Disease

## 2014-10-10 NOTE — Telephone Encounter (Signed)
Patient states he is going to have a bone spur removed from his shoulder soon by Dr. Durward Fortes.  Wants to know if he should hold his coumadin prior to this procedure.  Advised patient to request this info from the surgeon.  If he needs to hold coumadin Dr. Wonda Horner office will send a request to Dr. Loletha Grayer.  Patient voiced understanding and will contact their office to make sure what the surgeon wants.

## 2014-10-14 ENCOUNTER — Other Ambulatory Visit: Payer: Self-pay | Admitting: Cardiovascular Disease

## 2014-10-14 ENCOUNTER — Other Ambulatory Visit: Payer: Self-pay | Admitting: Internal Medicine

## 2014-11-01 ENCOUNTER — Ambulatory Visit (INDEPENDENT_AMBULATORY_CARE_PROVIDER_SITE_OTHER): Payer: Medicare Other | Admitting: *Deleted

## 2014-11-01 ENCOUNTER — Telehealth: Payer: Self-pay | Admitting: Cardiovascular Disease

## 2014-11-01 DIAGNOSIS — I4891 Unspecified atrial fibrillation: Secondary | ICD-10-CM | POA: Diagnosis not present

## 2014-11-01 DIAGNOSIS — Z7901 Long term (current) use of anticoagulants: Secondary | ICD-10-CM | POA: Diagnosis not present

## 2014-11-01 LAB — POCT INR: INR: 2.2

## 2014-11-01 NOTE — Telephone Encounter (Signed)
Dr. Durward Fortes is calling to speak with the nurse about the pt's coumadin. Please assist  Thanks

## 2014-11-01 NOTE — Telephone Encounter (Signed)
Spoke to Dr Durward Fortes- PATIENT NEEDS HAND SURGERY PATIENTIS TAKING WARFARIN- LAST INR 2.2 DR Lowden.  RN REVIEWED INFORMATION WITH KRISTIN - pharm-d  PER KRISTIN, HOLD WARFARIN FOR 5 DAYS PRIOR, NO BRIDGING NEEDED, RESTART THE NIGHT OF SURGERY. INFORMATION GIVEN TO Hassan Rowan for Dr Durward Fortes

## 2014-11-07 ENCOUNTER — Telehealth: Payer: Self-pay | Admitting: Cardiovascular Disease

## 2014-11-07 NOTE — Telephone Encounter (Signed)
Pt is having surgery on his shoulder on Thursday. He have some questions about his medicine.

## 2014-11-07 NOTE — Telephone Encounter (Signed)
OK to hold ASA and warfarin for surgery. Restart as soon as the surgeon thinks it is safe.

## 2014-11-07 NOTE — Telephone Encounter (Signed)
Returned call to patient.He stated he is scheduled to have shoulder surgery this Thursday 11/09/14.Stated he was told to hold coumadin and aspirin.Stated he has been holding both since Sunday 7/31.Stated he wanted to make sure this is ok with Dr.Croitoru.Message sent to Dr.Croitoru.

## 2014-11-07 NOTE — Telephone Encounter (Signed)
Pt says if he is not at home when you call,please call-[phone].

## 2014-11-07 NOTE — Telephone Encounter (Signed)
Returned call to patient.Dr.Croitoru advised ok to hold aspirin and coumadin.Advised to restart both when surgeon thinks it is safe.

## 2014-11-09 DIAGNOSIS — G8918 Other acute postprocedural pain: Secondary | ICD-10-CM | POA: Diagnosis not present

## 2014-11-09 DIAGNOSIS — M19012 Primary osteoarthritis, left shoulder: Secondary | ICD-10-CM | POA: Diagnosis not present

## 2014-11-09 DIAGNOSIS — M24112 Other articular cartilage disorders, left shoulder: Secondary | ICD-10-CM | POA: Diagnosis not present

## 2014-11-09 DIAGNOSIS — M659 Synovitis and tenosynovitis, unspecified: Secondary | ICD-10-CM | POA: Diagnosis not present

## 2014-11-09 DIAGNOSIS — M7522 Bicipital tendinitis, left shoulder: Secondary | ICD-10-CM | POA: Diagnosis not present

## 2014-11-09 DIAGNOSIS — M75122 Complete rotator cuff tear or rupture of left shoulder, not specified as traumatic: Secondary | ICD-10-CM | POA: Diagnosis not present

## 2014-11-09 DIAGNOSIS — M7542 Impingement syndrome of left shoulder: Secondary | ICD-10-CM | POA: Diagnosis not present

## 2014-11-09 DIAGNOSIS — M7502 Adhesive capsulitis of left shoulder: Secondary | ICD-10-CM | POA: Diagnosis not present

## 2014-11-15 ENCOUNTER — Ambulatory Visit (INDEPENDENT_AMBULATORY_CARE_PROVIDER_SITE_OTHER): Payer: Medicare Other | Admitting: *Deleted

## 2014-11-15 DIAGNOSIS — Z7901 Long term (current) use of anticoagulants: Secondary | ICD-10-CM

## 2014-11-15 DIAGNOSIS — I4891 Unspecified atrial fibrillation: Secondary | ICD-10-CM

## 2014-11-15 LAB — POCT INR: INR: 1.7

## 2014-11-17 ENCOUNTER — Ambulatory Visit (HOSPITAL_COMMUNITY): Payer: Medicare Other | Attending: Orthopaedic Surgery

## 2014-11-17 ENCOUNTER — Encounter (HOSPITAL_COMMUNITY): Payer: Self-pay

## 2014-11-17 DIAGNOSIS — Z9889 Other specified postprocedural states: Secondary | ICD-10-CM | POA: Insufficient documentation

## 2014-11-17 DIAGNOSIS — M629 Disorder of muscle, unspecified: Secondary | ICD-10-CM | POA: Diagnosis not present

## 2014-11-17 DIAGNOSIS — R29898 Other symptoms and signs involving the musculoskeletal system: Secondary | ICD-10-CM

## 2014-11-17 DIAGNOSIS — M25512 Pain in left shoulder: Secondary | ICD-10-CM | POA: Diagnosis not present

## 2014-11-17 DIAGNOSIS — M6289 Other specified disorders of muscle: Secondary | ICD-10-CM

## 2014-11-17 DIAGNOSIS — M25612 Stiffness of left shoulder, not elsewhere classified: Secondary | ICD-10-CM | POA: Insufficient documentation

## 2014-11-17 NOTE — Patient Instructions (Signed)
TOWEL SLIDES COMPLETE FOR 1-3 MINUTES, 3-5 TIMES PER DAY  SHOULDER: Flexion On Table   Place hands on table, elbows straight. Move hips away from body. Press hands down into table. Hold ___ seconds. ___ reps per set, ___ sets per day, ___ days per week  Abduction (Passive)   With arm out to side, resting on table, lower head toward arm, keeping trunk away from table. Hold ____ seconds. Repeat ____ times. Do ____ sessions per day.  Copyright  VHI. All rights reserved.     Internal Rotation (Assistive)   Seated with elbow bent at right angle and held against side, slide arm on table surface in an inward arc. Repeat ____ times. Do ____ sessions per day. Activity: Use this motion to brush crumbs off the table.  Copyright  VHI. All rights reserved.    COMPLETE PENDULUM EXERCISES FOR 30 SECONDS TO A MINUTE EACH, 3-5 TIMES PER DAY. ROM: Pendulum (Side-to-Side)     http://orth.exer.us/792   Copyright  VHI. All rights reserved.  Pendulum Forward/Back   Bend forward 90 at waist, using table for support. Rock body forward and back to swing arm. Repeat ____ times. Do ____ sessions per day.  Copyright  VHI. All rights reserved.  Pendulum Circular   Bend forward 90 at waist, leaning on table for support. Rock body in a circular pattern to move arm clockwise ____ times then counterclockwise ____ times. Do ____ sessions per day.  Copyright  VHI. All rights reserved.  AROM: Wrist Extension   With right palm down, bend wrist up. Repeat 10____ times per set. Do ____ sets per session. Do __3__ sessions per day.  Copyright  VHI. All rights reserved.   AROM: Wrist Flexion   With right palm up, bend wrist up. Repeat ___10_ times per set. Do ____ sets per session. Do __3__ sessions per day.  Copyright  VHI. All rights reserved.   AROM: Forearm Pronation / Supination   With right arm in handshake position, slowly rotate palm down until stretch is felt. Relax. Then  rotate palm up until stretch is felt. Repeat __10__ times per set. Do ____ sets per session. Do __3__ sessions per day.  Copyright  VHI. All rights reserved.   AFlexion (Passive)   Use other hand to bend elbow, with thumb toward same shoulder. Do NOT force this motion. . Repeat _10___ times. Do ____ sessions per day.  Copyright  VHI. All rights reserved.    With left hand palm up, gently bend elbow as far as possible. Then straighten arm as far as possible. Repeat __10__ times per set. Do ____ sets per session. Do ____ sessions per day.  Copyright  VHI. All rights reserved.

## 2014-11-17 NOTE — Therapy (Signed)
[place], [place], [address] Phone: [phone]   Fax:  [phone]  Occupational Therapy Evaluation  Patient Details  Name: Jerry Cain MRN: [id number] Date of Birth: January 11, 1932 Referring Provider:  Garald Balding, MD  Encounter Date: 11/17/2014      OT End of Session - 11/17/14 1707    Visit Number 1   Number of Visits 36   Date for OT Re-Evaluation 01/16/15  mini reassessment: 12/15/14   Authorization Type Medicare part A   Authorization Time Period before 10th visit`   Authorization - Visit Number 1   Authorization - Number of Visits 10   OT Start Time 1430   OT Stop Time 1515   OT Time Calculation (min) 45 min   Activity Tolerance Patient tolerated treatment well   Behavior During Therapy [facility] for tasks assessed/performed      Past Medical History  Diagnosis Date  . Hypercholesterolemia     takes Atorvastatin daily  . Gout   . BPH (benign prostatic hyperplasia)     takes Proscar daily  . CAD (coronary artery disease) 2009    cardiac cath and PTCA by Dr. Tami Ribas  . Tubular adenoma 2001  . Diverticulosis 2007    tcs by Dr. Laural Golden  . GERD (gastroesophageal reflux disease)   . Adenomatous colon polyp 2001  . Osteoarthritis     left knee  . Complication of anesthesia     hard to wake up  . Hypertension     takes Imdur,Coreg,and Lisinopril daily  . Atrial fibrillation     takes Coumadin daily  . Pneumonia     many,many yrs ago  . Numbness     right hand pointer and middle finger  . Carpal tunnel syndrome     right  . Joint pain   . Joint swelling   . Nocturia   . Hypothyroidism     takes SYnthroid daily  . Anxiety     Past Surgical History  Procedure Laterality Date  . Hernia repair  2009    left inguinal  . Cataract extraction      bilateral  . Colonoscopy  2007    Dr. Laural Golden- L sided diverticulosis. Next TCS 2012 due to h/o tubular adenoma.  . Colonoscopy   02/26/2011    Procedure: COLONOSCOPY;  Surgeon: Daneil Dolin, MD;  [facility];  Service: Endoscopy;  Laterality: N/A;  11:05  . Tee without cardioversion  12/12/2011    Procedure: TRANSESOPHAGEAL ECHOCARDIOGRAM (TEE);  Surgeon: Sanda Klein, MD;  Location: [facility] ENDOSCOPY;  Service: Cardiovascular;  Laterality: N/A;   successful/sinus rhythm  . Cardioversion  12/12/2011    Procedure: CARDIOVERSION;  Surgeon: Sanda Klein, MD;  Location: [place];  Service: Cardiovascular;  Laterality: N/A;  . Bunionectomy Right 03/17/2013    Procedure: Lillard Anes, Arthroplasty 2nd toe right foot;  Surgeon: Marcheta Grammes, DPM;  Location: [facility] OR[facility];  Service: Orthopedics;  Laterality: Right;  . Aiken osteotomy Right 03/17/2013    Procedure: Barbie Banner OSTEOTOMY;  Surgeon: Marcheta Grammes, DPM;  Location: [facility];  Service: Orthopedics;  Laterality: Right;  . Flexor tenotomy Left 03/17/2013    Procedure: PERCUTANEOUS FLEXOR TENOTOMY 3RD TOE LEFT FOOT;  Surgeon: Marcheta Grammes, DPM;  Location: [facility];  Service: Orthopedics;  Laterality: Left;  . Cardiac catheterization  06/11/2007    PTCA X2 in 06/2007:  2 overlapping Cypher stents to LAD (2.5x13 and 3.0x23)  . Cardiac catheterization  06/21/2007    Cypher stent 2.5 x 13  to OM branch  . Coronary angioplasty      x 3   . Total knee arthroplasty Left 06/14/2013    Procedure: TOTAL KNEE ARTHROPLASTY;  Surgeon: Garald Balding, MD;  [facility];  Service: Orthopedics;  Laterality: Left;    There were no vitals filed for this visit.  Visit Diagnosis:  S/P rotator cuff repair - Plan: Ot plan of care cert/re-cert  Pain in joint, shoulder region, left - Plan: Ot plan of care cert/re-cert  Tight fascia - Plan: Ot plan of care cert/re-cert  Shoulder stiffness, left - Plan: Ot plan of care cert/re-cert  Shoulder weakness - Plan: Ot plan of care cert/re-cert      Subjective Assessment - 11/17/14 1443    Subjective  S: I  take the sling off when I sleep and 2-3 hours at a time at home.   Patient is accompained by: Family member   Pertinent History Patient is a 81 y/o male s/p Left shoulder RCR, SAD, DCR on 11/09/14 after being diagnosed with impingement and degeneration at the [facility] joint. Patient presents today with sling on stating that he take it off at night and periodically during the daily. Dr. Durward Fortes has referred patient to occupational therapy for evaluation and treatment.    Special Tests FOTO score: 22/100   Patient Stated Goals To increase use of arm.   Currently in Pain? No/denies           [facility] OT Assessment - 11/17/14 1437    Assessment   Diagnosis Right shoulder RCR, SAD, DCR   Onset Date --  sx - 11/09/14   Assessment --  11/22/14 Durward Fortes   Prior Therapy None   Precautions   Precautions Shoulder   Type of Shoulder Precautions PROM for 4 weeks (12/07/14) then progress to AAROM (01/04/15). Progress to AROM and progress as tolerated.   Shoulder Interventions Shoulder sling/immobilizer;Off for dressing/bathing/exercises  allowed to take for 2-3 hours a day   Restrictions   Weight Bearing Restrictions Yes   Balance Screen   Has the patient fallen in the past 6 months No   Home  Environment   Family/patient expects to be discharged to: Private residence   Living Arrangements Spouse/significant other   Prior Function   Level of Independence Independent   Vocation Retired   Leisure Patient has 8 grandchildren.   ADL   ADL comments Difficulty using LUE for any daily functional task.   Mobility   Mobility Status Independent   Written Expression   Dominant Hand Right   Vision - History   Baseline Vision No visual deficits   Cognition   Overall Cognitive Status Within Functional Limits for tasks assessed   Edema   Edema Increase edema located in LUE beginning at left hand all the way to shoulder. patient was given large edema glove to help decrease swelling in hand.   ROM / Strength    AROM / PROM / Strength AROM;PROM;Strength   Palpation   Palpation comment Mod-max fascial restrictions in left upper arm, trapezius, and scapularis region.    AROM   Overall AROM  Unable to assess;Due to precautions   AROM Assessment Site Shoulder   Right/Left Shoulder Left   PROM   Overall PROM Comments Assessed supine. IR/ER adducted.    PROM Assessment Site Shoulder;Elbow;Forearm;Wrist   Right/Left Shoulder Left   Left Shoulder Flexion 45 Degrees   Left Shoulder ABduction 56 Degrees  Left Shoulder Internal Rotation 90 Degrees   Left Shoulder External Rotation 21 Degrees   Right/Left Elbow Left   Left Elbow Flexion 63   Left Elbow Extension -43   Right/Left Forearm Left   Left Forearm Pronation 90 Degrees   Left Forearm Supination 67 Degrees   Strength   Overall Strength Unable to assess;Due to precautions   Strength Assessment Site Shoulder   Right/Left Shoulder Left                         OT Education - 11/17/14 1707    Education provided Yes   Education Details Pendulums, table slides, wrist and elbow AROM exercises.   Person(s) Educated Patient;Spouse   Methods Explanation;Demonstration;Handout   Comprehension Returned demonstration;Verbalized understanding          OT Short Term Goals - 11/17/14 1711    OT SHORT TERM GOAL #1   Title Patient will be education and independent with HEP.   Time 8   Period Weeks   Status New   OT SHORT TERM GOAL #2   Title Patient will increase PROM to [facility] to increase ability to get dressed with less difficulty.   Time 8   Period Weeks   Status New   OT SHORT TERM GOAL #3   Title Patient will increase strength to 3/5 to increase ability to complete daily tasks below shoulder height.    Time 8   Period Weeks   Status New   OT SHORT TERM GOAL #4   Title Patient will decrease fascial restrictions from max to min amount.   Time 8   Period Weeks   Status New   OT SHORT TERM GOAL #5   Title Patient will  decrease pain during daily tasks to 4/10.   Time 8   Period Weeks   Status New           OT Long Term Goals - 11/17/14 1714    OT LONG TERM GOAL #1   Title Patient will return to highest level of independence with all daily tasks using LUE.   Time 12   Period Weeks   Status New   OT LONG TERM GOAL #2   Title Patient will increase AROM to WNL to increase ability to complete daily tasks above shoulder height with less difficulty.    Time 12   Period Weeks   Status New   OT LONG TERM GOAL #3   Title Patient will increase LUE strength to 4/5 to increase ability to hold grandchildren.   Time 12   Period Weeks   Status New   OT LONG TERM GOAL #4   Title Patient will decrease fascial restrictions to trace amount.   Time 12   Period Weeks   Status New   OT LONG TERM GOAL #5   Title Patient will decrease pain level to 2/10 or less when completing daily tasks.   Time 12   Period Weeks   Status New               Plan - 11/17/14 1708    Clinical Impression Statement A: Patient is a 81 y/o male s/p left shoulder RCR, SAD, DCR causing increased pain and fascial restrictions and decreased strength and ROM resulting in difficulty completing functional daily tasks using LUE.   Pt will benefit from skilled therapeutic intervention in order to improve on the following deficits (Retired) Decreased range of motion;Increased edema;Increased fascial restricitons;Pain;Impaired  UE functional use;Decreased strength   Rehab Potential Excellent   OT Frequency 3x / week   OT Duration 12 weeks   OT Treatment/Interventions Self-care/ADL training;Therapeutic exercise;Patient/family education;Manual Therapy;Therapeutic activities;Cryotherapy;Electrical Stimulation;Moist Heat;Passive range of motion;Scar mobilization   Plan P: Patient will benefit from skilled OT services to increase functional performance during daily tasks using LUE as nondominant extremity.    Consulted and Agree with Plan of  Care Patient          G-Codes - Dec 09, 2014 1719    Functional Assessment Tool Used FOTO score: 22/100 (78% impaired)   Functional Limitation Carrying, moving and handling objects   Carrying, Moving and Handling Objects Current Status (T0240) At least 60 percent but less than 80 percent impaired, limited or restricted   Carrying, Moving and Handling Objects Goal Status (X7353) At least 20 percent but less than 40 percent impaired, limited or restricted      Problem List Patient Active Problem List   Diagnosis Date Noted  . Arthritis of knee, left 06/16/2013  . S/P total knee replacement using cement 06/14/2013  . Osteoarthritis of left knee 03/21/2013  . BPH (benign prostatic hyperplasia)   . GERD (gastroesophageal reflux disease)   . CAD (coronary artery disease)   . Fever 03/20/2013  . Altered mental state 03/20/2013  . Postoperative fever 03/20/2013  . PNA (pneumonia) 03/20/2013  . Preop cardiovascular exam 11/24/2012  . Peroneal tendonitis 10/05/2012  . Arthritis of foot, degenerative 10/05/2012  . HTN (hypertension) 09/25/2012  . Hyperlipidemia 09/25/2012  . Atrial fibrillation 06/15/2012  . Long term (current) use of anticoagulants 06/15/2012  . Rotator cuff strain 02/04/2012  . Shoulder pain 02/04/2012  . Personal history of colonic polyps 01/29/2011  . High risk medication use 01/29/2011    Ailene Ravel, OTR/L,CBIS  [phone]  12-09-2014, 5:21 PM  [facility] [address] [place], [place], [address] Phone: [phone]   Fax:  [phone]

## 2014-11-20 ENCOUNTER — Ambulatory Visit (HOSPITAL_COMMUNITY): Payer: Medicare Other

## 2014-11-20 ENCOUNTER — Encounter (HOSPITAL_COMMUNITY): Payer: Self-pay

## 2014-11-20 DIAGNOSIS — Z9889 Other specified postprocedural states: Secondary | ICD-10-CM | POA: Diagnosis not present

## 2014-11-20 DIAGNOSIS — R29898 Other symptoms and signs involving the musculoskeletal system: Secondary | ICD-10-CM | POA: Diagnosis not present

## 2014-11-20 DIAGNOSIS — M629 Disorder of muscle, unspecified: Secondary | ICD-10-CM | POA: Diagnosis not present

## 2014-11-20 DIAGNOSIS — M25612 Stiffness of left shoulder, not elsewhere classified: Secondary | ICD-10-CM

## 2014-11-20 DIAGNOSIS — M25512 Pain in left shoulder: Secondary | ICD-10-CM

## 2014-11-20 DIAGNOSIS — M6289 Other specified disorders of muscle: Secondary | ICD-10-CM

## 2014-11-20 NOTE — Therapy (Signed)
[place], [place], [address] Phone: [phone]   Fax:  [phone]  Occupational Therapy Treatment  Patient Details  Name: Jerry Cain MRN: [id number] Date of Birth: February 17, 1932 Referring Provider:  Octavio Graves, DO  Encounter Date: 11/20/2014      OT End of Session - 11/20/14 1538    Visit Number 2   Number of Visits 36   Date for OT Re-Evaluation 01/16/15  mini reassessment: 12/15/14   Authorization Type Medicare part A   Authorization Time Period before 10th visit`   Authorization - Visit Number 2   Authorization - Number of Visits 10   OT Start Time 1430   OT Stop Time 1515   OT Time Calculation (min) 45 min   Activity Tolerance Patient tolerated treatment well   Behavior During Therapy [facility] for tasks assessed/performed      Past Medical History  Diagnosis Date  . Hypercholesterolemia     takes Atorvastatin daily  . Gout   . BPH (benign prostatic hyperplasia)     takes Proscar daily  . CAD (coronary artery disease) 2009    cardiac cath and PTCA by Dr. Tami Ribas  . Tubular adenoma 2001  . Diverticulosis 2007    tcs by Dr. Laural Golden  . GERD (gastroesophageal reflux disease)   . Adenomatous colon polyp 2001  . Osteoarthritis     left knee  . Complication of anesthesia     hard to wake up  . Hypertension     takes Imdur,Coreg,and Lisinopril daily  . Atrial fibrillation     takes Coumadin daily  . Pneumonia     many,many yrs ago  . Numbness     right hand pointer and middle finger  . Carpal tunnel syndrome     right  . Joint pain   . Joint swelling   . Nocturia   . Hypothyroidism     takes SYnthroid daily  . Anxiety     Past Surgical History  Procedure Laterality Date  . Hernia repair  2009    left inguinal  . Cataract extraction      bilateral  . Colonoscopy  2007    Dr. Laural Golden- L sided diverticulosis. Next TCS 2012 due to h/o tubular adenoma.  . Colonoscopy  02/26/2011    Procedure: COLONOSCOPY;  Surgeon: Daneil Dolin, MD;  [facility];  Service: Endoscopy;  Laterality: N/A;  11:05  . Tee without cardioversion  12/12/2011    Procedure: TRANSESOPHAGEAL ECHOCARDIOGRAM (TEE);  Surgeon: Sanda Klein, MD;  Location: [facility] ENDOSCOPY;  Service: Cardiovascular;  Laterality: N/A;   successful/sinus rhythm  . Cardioversion  12/12/2011    Procedure: CARDIOVERSION;  Surgeon: Sanda Klein, MD;  Location: [place];  Service: Cardiovascular;  Laterality: N/A;  . Bunionectomy Right 03/17/2013    Procedure: Lillard Anes, Arthroplasty 2nd toe right foot;  Surgeon: Marcheta Grammes, DPM;  Location: [facility];  Service: Orthopedics;  Laterality: Right;  . Aiken osteotomy Right 03/17/2013    Procedure: Barbie Banner OSTEOTOMY;  Surgeon: Marcheta Grammes, DPM;  Location: [facility];  Service: Orthopedics;  Laterality: Right;  . Flexor tenotomy Left 03/17/2013    Procedure: PERCUTANEOUS FLEXOR TENOTOMY 3RD TOE LEFT FOOT;  Surgeon: Marcheta Grammes, DPM;  Location: [facility];  Service: Orthopedics;  Laterality: Left;  . Cardiac catheterization  06/11/2007    PTCA X2 in 06/2007:  2 overlapping Cypher stents to LAD (2.5x13 and 3.0x23)  . Cardiac catheterization  06/21/2007  Cypher stent 2.5 x 13  to OM branch  . Coronary angioplasty      x 3   . Total knee arthroplasty Left 06/14/2013    Procedure: TOTAL KNEE ARTHROPLASTY;  Surgeon: Garald Balding, MD;  Location: [place];  Service: Orthopedics;  Laterality: Left;    There were no vitals filed for this visit.  Visit Diagnosis:  Pain in joint, shoulder region, left  Tight fascia  Shoulder stiffness, left  Shoulder weakness      Subjective Assessment - 11/20/14 1535    Subjective  S: The first day when you moved my arm out (abduction) it just killed me. And I was so sore all weekend. I couldn't do anything with this arm.    Currently in Pain? Yes   Pain Score 5    Pain Location Shoulder   Pain Orientation  Left   Pain Descriptors / Indicators Aching;Constant   Pain Type Surgical pain   Pain Onset In the past 7 days                      OT Treatments/Exercises (OP) - 11/20/14 1509    Exercises   Exercises Shoulder   Shoulder Exercises: Supine   Protraction PROM;10 reps   Horizontal ABduction 10 reps  Closer to [place] vs. Horizontal abduction   External Rotation PROM;10 reps   Internal Rotation PROM;10 reps   Flexion PROM;10 reps   ABduction PROM;10 reps   Shoulder Exercises: Seated   Elevation AROM;10 reps   Extension AROM;10 reps   Row AROM;10 reps   Shoulder Exercises: Therapy Ball   Flexion 5 reps;Limitations   Flexion Limitations with hand over hand assist from OTR/L   Manual Therapy   Manual Therapy Edema management;Myofascial release   Edema Management Edema technique to LUE starting at hand and ending at upper arm region.   Myofascial Release Myofascial release to left elbow, upper arm,                 OT Education - 11/20/14 1537    Education provided Yes   Education Details Discussed in depth the procedures that were performed on shoulder, normal recovery time, length of therapy, reviewed goals. Pt was given OT evaluation.   Person(s) Educated Patient   Methods Explanation;Demonstration;Handout   Comprehension Verbalized understanding;Returned demonstration          OT Short Term Goals - 11/20/14 1542    OT SHORT TERM GOAL #1   Title Patient will be education and independent with HEP.   Status On-going   OT SHORT TERM GOAL #2   Title Patient will increase PROM to [facility] to increase ability to get dressed with less difficulty.   Status On-going   OT SHORT TERM GOAL #3   Title Patient will increase strength to 3/5 to increase ability to complete daily tasks below shoulder height.    Status On-going   OT SHORT TERM GOAL #4   Title Patient will decrease fascial restrictions from max to min amount.   Status On-going   OT SHORT TERM GOAL #5    Title Patient will decrease pain during daily tasks to 4/10.   Status On-going           OT Long Term Goals - 11/20/14 1543    OT LONG TERM GOAL #1   Title Patient will return to highest level of independence with all daily tasks using LUE.   Status On-going   OT LONG TERM  GOAL #2   Title Patient will increase AROM to WNL to increase ability to complete daily tasks above shoulder height with less difficulty.    Status On-going   OT LONG TERM GOAL #3   Title Patient will increase LUE strength to 4/5 to increase ability to hold grandchildren.   Status On-going   OT LONG TERM GOAL #4   Title Patient will decrease fascial restrictions to trace amount.   Status On-going   OT LONG TERM GOAL #5   Title Patient will decrease pain level to 2/10 or less when completing daily tasks.   Status On-going               Plan - 11/20/14 1538    Clinical Impression Statement A: Initiated myofascial release, manual stretches, edema management techniques, and therapy ball stretches. Pt presents with very limited PROM due to pain level and muscle tightness. Edema glove appears to be helping with hand swelling although edema is present in entire LUE.    Plan P: Complete Abduction therapy ball. Add isometrics.         Problem List Patient Active Problem List   Diagnosis Date Noted  . Arthritis of knee, left 06/16/2013  . S/P total knee replacement using cement 06/14/2013  . Osteoarthritis of left knee 03/21/2013  . BPH (benign prostatic hyperplasia)   . GERD (gastroesophageal reflux disease)   . CAD (coronary artery disease)   . Fever 03/20/2013  . Altered mental state 03/20/2013  . Postoperative fever 03/20/2013  . PNA (pneumonia) 03/20/2013  . Preop cardiovascular exam 11/24/2012  . Peroneal tendonitis 10/05/2012  . Arthritis of foot, degenerative 10/05/2012  . HTN (hypertension) 09/25/2012  . Hyperlipidemia 09/25/2012  . Atrial fibrillation 06/15/2012  . Long term (current)  use of anticoagulants 06/15/2012  . Rotator cuff strain 02/04/2012  . Shoulder pain 02/04/2012  . Personal history of colonic polyps 01/29/2011  . High risk medication use 01/29/2011    Ailene Ravel, OTR/L,CBIS  [phone]  11/20/2014, 3:46 PM  [place] [address] [place], [place], [address] Phone: [phone]   Fax:  [phone]

## 2014-11-22 ENCOUNTER — Encounter (HOSPITAL_COMMUNITY): Payer: Self-pay

## 2014-11-22 ENCOUNTER — Ambulatory Visit (HOSPITAL_COMMUNITY): Payer: Medicare Other

## 2014-11-22 DIAGNOSIS — M6289 Other specified disorders of muscle: Secondary | ICD-10-CM

## 2014-11-22 DIAGNOSIS — M25512 Pain in left shoulder: Secondary | ICD-10-CM

## 2014-11-22 DIAGNOSIS — Z9889 Other specified postprocedural states: Secondary | ICD-10-CM | POA: Diagnosis not present

## 2014-11-22 DIAGNOSIS — M629 Disorder of muscle, unspecified: Secondary | ICD-10-CM

## 2014-11-22 DIAGNOSIS — R29898 Other symptoms and signs involving the musculoskeletal system: Secondary | ICD-10-CM | POA: Diagnosis not present

## 2014-11-22 DIAGNOSIS — M25612 Stiffness of left shoulder, not elsewhere classified: Secondary | ICD-10-CM

## 2014-11-22 NOTE — Therapy (Signed)
[place], [place], [address] Phone: [phone]   Fax:  [phone]  Occupational Therapy Treatment  Patient Details  Name: Jerry Cain MRN: [id number] Date of Birth: 03-22-1932 Referring Provider:  Garald Balding, MD  Encounter Date: 11/22/2014      OT End of Session - 11/22/14 1625    Visit Number 3   Number of Visits 36   Date for OT Re-Evaluation 01/16/15  mini reassessment: 12/15/14   Authorization Type Medicare part A   Authorization Time Period before 10th visit`   Authorization - Visit Number 3   Authorization - Number of Visits 10   OT Start Time 1430   OT Stop Time 1515   OT Time Calculation (min) 45 min   Activity Tolerance Patient tolerated treatment well   Behavior During Therapy [facility] for tasks assessed/performed      Past Medical History  Diagnosis Date  . Hypercholesterolemia     takes Atorvastatin daily  . Gout   . BPH (benign prostatic hyperplasia)     takes Proscar daily  . CAD (coronary artery disease) 2009    cardiac cath and PTCA by Dr. Tami Ribas  . Tubular adenoma 2001  . Diverticulosis 2007    tcs by Dr. Laural Golden  . GERD (gastroesophageal reflux disease)   . Adenomatous colon polyp 2001  . Osteoarthritis     left knee  . Complication of anesthesia     hard to wake up  . Hypertension     takes Imdur,Coreg,and Lisinopril daily  . Atrial fibrillation     takes Coumadin daily  . Pneumonia     many,many yrs ago  . Numbness     right hand pointer and middle finger  . Carpal tunnel syndrome     right  . Joint pain   . Joint swelling   . Nocturia   . Hypothyroidism     takes SYnthroid daily  . Anxiety     Past Surgical History  Procedure Laterality Date  . Hernia repair  2009    left inguinal  . Cataract extraction      bilateral  . Colonoscopy  2007    Dr. Laural Golden- L sided diverticulosis. Next TCS 2012 due to h/o tubular adenoma.  . Colonoscopy  02/26/2011     Procedure: COLONOSCOPY;  Surgeon: Daneil Dolin, MD;  [facility];  Service: Endoscopy;  Laterality: N/A;  11:05  . Tee without cardioversion  12/12/2011    Procedure: TRANSESOPHAGEAL ECHOCARDIOGRAM (TEE);  Surgeon: Sanda Klein, MD;  Location: [facility] ENDOSCOPY;  Service: Cardiovascular;  Laterality: N/A;   successful/sinus rhythm  . Cardioversion  12/12/2011    Procedure: CARDIOVERSION;  Surgeon: Sanda Klein, MD;  Location: [place];  Service: Cardiovascular;  Laterality: N/A;  . Bunionectomy Right 03/17/2013    Procedure: Lillard Anes, Arthroplasty 2nd toe right foot;  Surgeon: Marcheta Grammes, DPM;  Location: [facility];  Service: Orthopedics;  Laterality: Right;  . Aiken osteotomy Right 03/17/2013    Procedure: Barbie Banner OSTEOTOMY;  Surgeon: Marcheta Grammes, DPM;  Location: [facility];  Service: Orthopedics;  Laterality: Right;  . Flexor tenotomy Left 03/17/2013    Procedure: PERCUTANEOUS FLEXOR TENOTOMY 3RD TOE LEFT FOOT;  Surgeon: Marcheta Grammes, DPM;  Location: [facility];  Service: Orthopedics;  Laterality: Left;  . Cardiac catheterization  06/11/2007    PTCA X2 in 06/2007:  2 overlapping Cypher stents to LAD (2.5x13 and 3.0x23)  . Cardiac catheterization  06/21/2007    Cypher stent 2.5 x 13  to OM branch  . Coronary angioplasty      x 3   . Total knee arthroplasty Left 06/14/2013    Procedure: TOTAL KNEE ARTHROPLASTY;  Surgeon: Garald Balding, MD;  Location: [place];  Service: Orthopedics;  Laterality: Left;    There were no vitals filed for this visit.  Visit Diagnosis:  Pain in joint, shoulder region, left  Tight fascia  Shoulder stiffness, left      Subjective Assessment - 11/22/14 1507    Subjective  S: I went to the Doctor today and he showed me what he doesn't want me to do. He said not to move it myself. Only let you or my wife do it.    Currently in Pain? Yes   Pain Score 3    Pain Location Shoulder   Pain Orientation Left   Pain  Descriptors / Indicators Aching   Pain Type Surgical pain            [facility] OT Assessment - 11/22/14 1509    Assessment   Diagnosis Right shoulder RCR, SAD, DCR   Precautions   Precautions Shoulder   Type of Shoulder Precautions PROM for 4 weeks (12/07/14) then progress to AAROM (01/04/15). Progress to AROM and progress as tolerated.   Shoulder Interventions Shoulder sling/immobilizer;Off for dressing/bathing/exercises                  OT Treatments/Exercises (OP) - 11/22/14 1509    Exercises   Exercises Shoulder   Shoulder Exercises: Supine   Protraction PROM;10 reps   Horizontal ABduction PROM;10 reps   External Rotation PROM;10 reps   Internal Rotation PROM;10 reps   Flexion PROM;10 reps   ABduction PROM;10 reps   Shoulder Exercises: Seated   Elevation AROM;10 reps   Extension AROM;10 reps   Row AROM;10 reps   Shoulder Exercises: Therapy Ball   Flexion 10 reps   Flexion Limitations limited ROM   ABduction 10 reps   ABduction Limitations limited ROM   Shoulder Exercises: Isometric Strengthening   Flexion Supine;3X3"   Extension Supine;3X3"   External Rotation Supine;3X3"   Internal Rotation Supine;3X3"   ABduction Supine;3X3"   ADduction Supine;3X3"   Manual Therapy   Manual Therapy Edema management;Myofascial release   Edema Management Edema technique to LUE starting at hand and ending at upper arm region.   Myofascial Release Myofascial release to left elbow, upper arm, trapezius, and scapularis region to decrease fascial restrictions and increase joint mobility in a pain free zone.                   OT Short Term Goals - 11/20/14 1542    OT SHORT TERM GOAL #1   Title Patient will be education and independent with HEP.   Status On-going   OT SHORT TERM GOAL #2   Title Patient will increase PROM to [facility] to increase ability to get dressed with less difficulty.   Status On-going   OT SHORT TERM GOAL #3   Title Patient will increase strength  to 3/5 to increase ability to complete daily tasks below shoulder height.    Status On-going   OT SHORT TERM GOAL #4   Title Patient will decrease fascial restrictions from max to min amount.   Status On-going   OT SHORT TERM GOAL #5   Title Patient will decrease pain during daily tasks to 4/10.   Status On-going  OT Long Term Goals - 11/20/14 1543    OT LONG TERM GOAL #1   Title Patient will return to highest level of independence with all daily tasks using LUE.   Status On-going   OT LONG TERM GOAL #2   Title Patient will increase AROM to WNL to increase ability to complete daily tasks above shoulder height with less difficulty.    Status On-going   OT LONG TERM GOAL #3   Title Patient will increase LUE strength to 4/5 to increase ability to hold grandchildren.   Status On-going   OT LONG TERM GOAL #4   Title Patient will decrease fascial restrictions to trace amount.   Status On-going   OT LONG TERM GOAL #5   Title Patient will decrease pain level to 2/10 or less when completing daily tasks.   Status On-going               Plan - 11/22/14 1625    Clinical Impression Statement A: Added isometrics and abduction with therapy ball. patient tolerated well. Although patient is still limited with PROM he was able to progress further during passive stretching than previous sessions.   Plan P: Cont to work on decreasing edema in LUE. Increase joint mobility during passive stretching to increase UB dressing performance. Add shoulder glides.        Problem List Patient Active Problem List   Diagnosis Date Noted  . Arthritis of knee, left 06/16/2013  . S/P total knee replacement using cement 06/14/2013  . Osteoarthritis of left knee 03/21/2013  . BPH (benign prostatic hyperplasia)   . GERD (gastroesophageal reflux disease)   . CAD (coronary artery disease)   . Fever 03/20/2013  . Altered mental state 03/20/2013  . Postoperative fever 03/20/2013  . PNA  (pneumonia) 03/20/2013  . Preop cardiovascular exam 11/24/2012  . Peroneal tendonitis 10/05/2012  . Arthritis of foot, degenerative 10/05/2012  . HTN (hypertension) 09/25/2012  . Hyperlipidemia 09/25/2012  . Atrial fibrillation 06/15/2012  . Long term (current) use of anticoagulants 06/15/2012  . Rotator cuff strain 02/04/2012  . Shoulder pain 02/04/2012  . Personal history of colonic polyps 01/29/2011  . High risk medication use 01/29/2011    Ailene Ravel, OTR/L,CBIS  [phone]  11/22/2014, 4:29 PM  [place] [address] [place], [place], [address] Phone: [phone]   Fax:  [phone]

## 2014-11-23 ENCOUNTER — Other Ambulatory Visit: Payer: Self-pay | Admitting: Cardiovascular Disease

## 2014-11-23 ENCOUNTER — Encounter (HOSPITAL_COMMUNITY): Payer: Self-pay | Admitting: Emergency Medicine

## 2014-11-23 ENCOUNTER — Emergency Department (HOSPITAL_COMMUNITY)
Admission: EM | Admit: 2014-11-23 | Discharge: 2014-11-23 | Disposition: A | Discharge status: Home / Self Care | Payer: Medicare Other | Attending: Emergency Medicine | Admitting: Emergency Medicine

## 2014-11-23 ENCOUNTER — Emergency Department (HOSPITAL_COMMUNITY): Payer: Medicare Other

## 2014-11-23 DIAGNOSIS — Z8659 Personal history of other mental and behavioral disorders: Secondary | ICD-10-CM | POA: Insufficient documentation

## 2014-11-23 DIAGNOSIS — Z7982 Long term (current) use of aspirin: Secondary | ICD-10-CM | POA: Insufficient documentation

## 2014-11-23 DIAGNOSIS — E780 Pure hypercholesterolemia: Secondary | ICD-10-CM | POA: Diagnosis not present

## 2014-11-23 DIAGNOSIS — Z86018 Personal history of other benign neoplasm: Secondary | ICD-10-CM | POA: Diagnosis not present

## 2014-11-23 DIAGNOSIS — E039 Hypothyroidism, unspecified: Secondary | ICD-10-CM | POA: Insufficient documentation

## 2014-11-23 DIAGNOSIS — Z8719 Personal history of other diseases of the digestive system: Secondary | ICD-10-CM | POA: Diagnosis not present

## 2014-11-23 DIAGNOSIS — Z7901 Long term (current) use of anticoagulants: Secondary | ICD-10-CM | POA: Diagnosis not present

## 2014-11-23 DIAGNOSIS — M1990 Unspecified osteoarthritis, unspecified site: Secondary | ICD-10-CM | POA: Diagnosis not present

## 2014-11-23 DIAGNOSIS — Z880 Allergy status to penicillin: Secondary | ICD-10-CM | POA: Insufficient documentation

## 2014-11-23 DIAGNOSIS — R072 Precordial pain: Secondary | ICD-10-CM | POA: Diagnosis not present

## 2014-11-23 DIAGNOSIS — Z8669 Personal history of other diseases of the nervous system and sense organs: Secondary | ICD-10-CM | POA: Insufficient documentation

## 2014-11-23 DIAGNOSIS — I4891 Unspecified atrial fibrillation: Secondary | ICD-10-CM | POA: Diagnosis not present

## 2014-11-23 DIAGNOSIS — R0789 Other chest pain: Secondary | ICD-10-CM | POA: Diagnosis not present

## 2014-11-23 DIAGNOSIS — I10 Essential (primary) hypertension: Secondary | ICD-10-CM | POA: Diagnosis not present

## 2014-11-23 DIAGNOSIS — Z8701 Personal history of pneumonia (recurrent): Secondary | ICD-10-CM | POA: Insufficient documentation

## 2014-11-23 DIAGNOSIS — Z79899 Other long term (current) drug therapy: Secondary | ICD-10-CM | POA: Diagnosis not present

## 2014-11-23 DIAGNOSIS — Z87438 Personal history of other diseases of male genital organs: Secondary | ICD-10-CM | POA: Diagnosis not present

## 2014-11-23 DIAGNOSIS — R079 Chest pain, unspecified: Secondary | ICD-10-CM | POA: Diagnosis not present

## 2014-11-23 DIAGNOSIS — I2510 Atherosclerotic heart disease of native coronary artery without angina pectoris: Secondary | ICD-10-CM | POA: Insufficient documentation

## 2014-11-23 LAB — BASIC METABOLIC PANEL
Anion gap: 7 (ref 5–15)
BUN: 20 mg/dL (ref 6–20)
CO2: 26 mmol/L (ref 22–32)
CREATININE: 0.91 mg/dL (ref 0.61–1.24)
Calcium: 8.3 mg/dL — ABNORMAL LOW (ref 8.9–10.3)
Chloride: 101 mmol/L (ref 101–111)
GFR calc Af Amer: >60 mL/min (ref >60)
GFR calc non Af Amer: >60 mL/min (ref >60)
GLUCOSE: 94 mg/dL (ref 65–99)
POTASSIUM: 3.8 mmol/L (ref 3.5–5.1)
SODIUM: 134 mmol/L — AB (ref 135–145)

## 2014-11-23 LAB — CBC WITH DIFFERENTIAL/PLATELET
Basophils Absolute: 0 10*3/uL (ref 0.0–0.1)
Basophils Relative: 0 % (ref 0–1)
EOS ABS: 0.2 10*3/uL (ref 0.0–0.7)
EOS PCT: 3 % (ref 0–5)
HCT: 35.5 % — ABNORMAL LOW (ref 39.0–52.0)
Hemoglobin: 11.7 g/dL — ABNORMAL LOW (ref 13.0–17.0)
LYMPHS ABS: 1.4 10*3/uL (ref 0.7–4.0)
LYMPHS PCT: 19 % (ref 12–46)
MCH: 29.5 pg (ref 26.0–34.0)
MCHC: 33 g/dL (ref 30.0–36.0)
MCV: 89.4 fL (ref 78.0–100.0)
MONO ABS: 0.7 10*3/uL (ref 0.1–1.0)
Monocytes Relative: 10 % (ref 3–12)
Neutro Abs: 5 10*3/uL (ref 1.7–7.7)
Neutrophils Relative %: 68 % (ref 43–77)
PLATELETS: 263 10*3/uL (ref 150–400)
RBC: 3.97 MIL/uL — AB (ref 4.22–5.81)
RDW: 14.9 % (ref 11.5–15.5)
WBC: 7.4 10*3/uL (ref 4.0–10.5)

## 2014-11-23 LAB — TROPONIN I: Troponin I: <0.03 ng/mL (ref <0.031)

## 2014-11-23 LAB — PROTIME-INR
INR: 1.54 — AB (ref 0.00–1.49)
Prothrombin Time: 18.6 seconds — ABNORMAL HIGH (ref 11.6–15.2)

## 2014-11-23 LAB — I-STAT TROPONIN, ED: Troponin i, poc: 0 ng/mL (ref 0.00–0.08)

## 2014-11-23 NOTE — ED Notes (Signed)
Pt made aware to return if symptoms worsen or if any life threatening symptoms occur.   

## 2014-11-23 NOTE — ED Notes (Signed)
Patient started having chest pain around 2am this morning, took three nitro pills, 4 baby aspirin.  Currently pain free.

## 2014-11-23 NOTE — ED Provider Notes (Signed)
CSN: [id number]     Arrival date & time 11/23/14  5465 History   First MD Initiated Contact with Patient 11/23/14 [phone]     Chief Complaint  Patient presents with  . Chest Pain   Patient is a 81 y.o. male presenting with chest pain. The history is provided by the patient.  Chest Pain Pain location:  Substernal area Pain quality: burning   Pain radiates to:  Does not radiate Onset quality:  Gradual Timing:  Constant Progression:  Resolved Chronicity:  New Context comment:  After eating tuna Relieved by:  Aspirin Worsened by:  Nothing tried Ineffective treatments:  Nitroglycerin Associated symptoms: heartburn   Associated symptoms: no diaphoresis, no dizziness, no fever, no shortness of breath, not vomiting and no weakness   Risk factors: coronary artery disease   PT reports waking up at 2am with heartburn that lasted for several minutes He took NTG without much relief, and later took 8 ASA that resolved his pain He is now CP free He has no other symptoms He does not report this type of pain recently He reports recent left rotator cuff surgery without complications Past Medical History  Diagnosis Date  . Hypercholesterolemia     takes Atorvastatin daily  . Gout   . BPH (benign prostatic hyperplasia)     takes Proscar daily  . CAD (coronary artery disease) 2009    cardiac cath and PTCA by Dr. Tami Ribas  . Tubular adenoma 2001  . Diverticulosis 2007    tcs by Dr. Laural Golden  . GERD (gastroesophageal reflux disease)   . Adenomatous colon polyp 2001  . Osteoarthritis     left knee  . Complication of anesthesia     hard to wake up  . Hypertension     takes Imdur,Coreg,and Lisinopril daily  . Atrial fibrillation     takes Coumadin daily  . Pneumonia     many,many yrs ago  . Numbness     right hand pointer and middle finger  . Carpal tunnel syndrome     right  . Joint pain   . Joint swelling   . Nocturia   . Hypothyroidism     takes SYnthroid daily  . Anxiety    Past  Surgical History  Procedure Laterality Date  . Hernia repair  2009    left inguinal  . Cataract extraction      bilateral  . Colonoscopy  2007    Dr. Laural Golden- L sided diverticulosis. Next TCS 2012 due to h/o tubular adenoma.  . Colonoscopy  02/26/2011    Procedure: COLONOSCOPY;  Surgeon: Daneil Dolin, MD;  [facility];  Service: Endoscopy;  Laterality: N/A;  11:05  . Tee without cardioversion  12/12/2011    Procedure: TRANSESOPHAGEAL ECHOCARDIOGRAM (TEE);  Surgeon: Sanda Klein, MD;  Location: [facility];  Service: Cardiovascular;  Laterality: N/A;   successful/sinus rhythm  . Cardioversion  12/12/2011    Procedure: CARDIOVERSION;  Surgeon: Sanda Klein, MD;  Location: [place] [facility];  Service: Cardiovascular;  Laterality: N/A;  . Bunionectomy Right 03/17/2013    Procedure: Lillard Anes, Arthroplasty 2nd toe right foot;  Surgeon: Marcheta Grammes, DPM;  Location: [facility];  Service: Orthopedics;  Laterality: Right;  . Aiken osteotomy Right 03/17/2013    Procedure: Barbie Banner OSTEOTOMY;  Surgeon: Marcheta Grammes, DPM;  Location: [facility];  Service: Orthopedics;  Laterality: Right;  . Flexor tenotomy Left 03/17/2013    Procedure: PERCUTANEOUS FLEXOR TENOTOMY 3RD TOE LEFT FOOT;  Surgeon: Marcheta Grammes,  DPM;  Location: [facility];  Service: Orthopedics;  Laterality: Left;  . Cardiac catheterization  06/11/2007    PTCA X2 in 06/2007:  2 overlapping Cypher stents to LAD (2.5x13 and 3.0x23)  . Cardiac catheterization  06/21/2007    Cypher stent 2.5 x 13  to OM branch  . Coronary angioplasty      x 3   . Total knee arthroplasty Left 06/14/2013    Procedure: TOTAL KNEE ARTHROPLASTY;  Surgeon: Garald Balding, MD;  Location: [facility];  Service: Orthopedics;  Laterality: Left;  . Rot Left    Family History  Problem Relation Age of Onset  . Colon cancer Neg Hx   . Pneumonia Father   . Heart disease Brother     open heart surgery at age 52   Social History  Substance Use  Topics  . Smoking status: Never Smoker   . Smokeless tobacco: None  . Alcohol Use: No    Review of Systems  Constitutional: Negative for fever and diaphoresis.  Respiratory: Negative for shortness of breath.   Cardiovascular: Positive for chest pain.  Gastrointestinal: Positive for heartburn. Negative for vomiting.  Neurological: Negative for dizziness and weakness.  All other systems reviewed and are negative.     Allergies  Carbapenems; Cephalosporins; and Penicillins  Home Medications   Prior to Admission medications   Medication Sig Start Date End Date Taking? Authorizing Provider  aspirin 81 MG tablet Take 81 mg by mouth every morning.    Yes Historical Provider, MD  Calcium Carbonate-Vitamin D (CALCIUM + D) 600-200 MG-UNIT TABS Take 1 tablet by mouth 2 (two) times daily.    Yes Historical Provider, MD  carvedilol (COREG) 6.25 MG tablet Take 1 tablet (6.25 mg total) by mouth 2 (two) times daily. 05/31/14  Yes Mihai Croitoru, MD  finasteride (PROSCAR) 5 MG tablet Take 5 mg by mouth every morning.  01/28/11  Yes Historical Provider, MD  Glucosamine 750 MG TABS Take 750 mg by mouth 2 (two) times daily.   Yes Historical Provider, MD  isosorbide mononitrate (IMDUR) 30 MG 24 hr tablet TAKE ONE TABLET ONCE DAILY IN THE MORNING. 10/16/14  Yes Mihai Croitoru, MD  levothyroxine (SYNTHROID, LEVOTHROID) 100 MCG tablet Take 100 mcg by mouth daily before breakfast.   Yes Historical Provider, MD  lisinopril (PRINIVIL,ZESTRIL) 20 MG tablet TAKE ONE TABLET BY MOUTH AT BEDTIME (8PM). 10/16/14  Yes Pixie Casino, MD  Multiple Vitamin (MULTIVITAMIN) capsule Take 1 capsule by mouth every morning.    Yes Historical Provider, MD  nitroGLYCERIN (NITROSTAT) 0.4 MG SL tablet Place 1 tablet (0.4 mg total) under the tongue every 5 (five) minutes as needed. 12/27/12  Yes Lorretta Harp, MD  rosuvastatin (CRESTOR) 10 MG tablet Take 10 mg once a week 08/08/14  Yes Mihai Croitoru, MD  tamsulosin (FLOMAX) 0.4  MG CAPS capsule Take 0.4 mg by mouth at bedtime.  03/01/13  Yes Historical Provider, MD  warfarin (COUMADIN) 5 MG tablet TAKE 1 TABLET BY MOUTH DAILY OR AS DIRECTED BY COUMADIN CLINIC. 05/20/14  Yes Mihai Croitoru, MD   BP 146/90 mmHg  Pulse 58  Temp(Src) 97.9 F (36.6 C) (Oral)  Resp 20  Ht 6' (1.829 m)  Wt 185 lb (83.915 kg)  BMI 25.08 kg/m2  SpO2 98% Physical Exam CONSTITUTIONAL: Well developed/well nourished, smiling, well appearing HEAD: Normocephalic/atraumatic EYES: EOMI/PERRL ENMT: Mucous membranes moist NECK: supple no meningeal signs SPINE/BACK:entire spine nontender CV: bradycardic no murmurs/rubs/gallops noted LUNGS: Lungs are clear to auscultation  bilaterally, no apparent distress ABDOMEN: soft, nontender, no rebound or guarding, bowel sounds noted throughout abdomen GU:no cva tenderness NEURO: Pt is awake/alert/appropriate, moves all extremitiesx4.  No facial droop.   EXTREMITIES: pulses normal/equal,  left UE in sling SKIN: warm, color normal PSYCH: no abnormalities of mood noted, alert and oriented to situation  ED Course  Procedures  5:47 AM Pt with h/o CAD/afib presents with chest pain/heart burn without associated symptoms He is symptom free at this time I discussed management options with patient/family: Advised should be admitted due to CP with h/o CAD Pt prefers stay out of hospital Plan will be to recheck troponin/ekg at 3 hr mark.  If no change, and he has no CP episodes, he will be discharged 7:22 AM D/w dr Lacinda Axon, will f/u on repeat troponin/ekg   Labs Review Labs Reviewed  BASIC METABOLIC PANEL - Abnormal; Notable for the following:    Sodium 134 (*)    Calcium 8.3 (*)    All other components within normal limits  CBC WITH DIFFERENTIAL/PLATELET - Abnormal; Notable for the following:    RBC 3.97 (*)    Hemoglobin 11.7 (*)    HCT 35.5 (*)    All other components within normal limits  PROTIME-INR - Abnormal; Notable for the following:     Prothrombin Time 18.6 (*)    INR 1.54 (*)    All other components within normal limits  I-STAT TROPOININ, ED    Imaging Review Dg Chest Portable 1 View  11/23/2014   CLINICAL DATA:  Initial evaluation for acute chest pain.  EXAM: PORTABLE CHEST - 1 VIEW  COMPARISON:  Prior radiograph from 03/20/2013  FINDINGS: Cardiomegaly is stable from prior. Mediastinal silhouette within normal limits. Tracheal air column bone to the right around the aortic not.  Lungs are normally inflated. Mild left basilar atelectasis/scarring, similar to prior. No focal infiltrate. No pulmonary edema or pleural effusion. No pneumothorax.  No acute osseous abnormality.  IMPRESSION: 1. No active cardiopulmonary disease. 2. Stable cardiomegaly.   Electronically Signed   By: Jeannine Boga M.D.   On: 11/23/2014 05:03   I have personally reviewed and evaluated these images and lab results as part of my medical decision-making.   EKG Interpretation   Date/Time:  Thursday November 23 2014 04:25:19 EDT Ventricular Rate:  53 PR Interval:    QRS Duration: 169 QT Interval:  455 QTC Calculation: 427 R Axis:   -81 Text Interpretation:  indeterminate rhythm, suspect sinus rhythm with PVC  RBBB and LAFB Abnormal ekg Confirmed by Christy Gentles  MD, Elenore Rota ([id number]) on  11/23/2014 4:28:02 AM      MDM   Final diagnoses:  Burning chest pain    Nursing notes including past medical history and social history reviewed and considered in documentation xrays/imaging reviewed by myself and considered during evaluation Labs/vital reviewed myself and considered during evaluation Previous records reviewed and considered     Ripley Fraise, MD 11/23/14 [phone]

## 2014-11-23 NOTE — ED Provider Notes (Signed)
Troponin negative 2. Patient is feeling better. Discharge home. He has cardiology follow-up.  Nat Christen, MD 11/23/14 [phone]

## 2014-11-23 NOTE — Discharge Instructions (Signed)
Follow-up your cardiologist. Return if worse

## 2014-11-24 ENCOUNTER — Encounter (HOSPITAL_COMMUNITY): Payer: Self-pay | Admitting: Occupational Therapy

## 2014-11-24 ENCOUNTER — Ambulatory Visit (HOSPITAL_COMMUNITY): Payer: Medicare Other | Admitting: Occupational Therapy

## 2014-11-24 DIAGNOSIS — Z9889 Other specified postprocedural states: Secondary | ICD-10-CM | POA: Diagnosis not present

## 2014-11-24 DIAGNOSIS — M6289 Other specified disorders of muscle: Secondary | ICD-10-CM

## 2014-11-24 DIAGNOSIS — M25612 Stiffness of left shoulder, not elsewhere classified: Secondary | ICD-10-CM | POA: Diagnosis not present

## 2014-11-24 DIAGNOSIS — M629 Disorder of muscle, unspecified: Secondary | ICD-10-CM | POA: Diagnosis not present

## 2014-11-24 DIAGNOSIS — R29898 Other symptoms and signs involving the musculoskeletal system: Secondary | ICD-10-CM

## 2014-11-24 DIAGNOSIS — M25512 Pain in left shoulder: Secondary | ICD-10-CM | POA: Diagnosis not present

## 2014-11-24 NOTE — Telephone Encounter (Signed)
Rx(s) sent to pharmacy electronically.  

## 2014-11-24 NOTE — Therapy (Signed)
[place], [place], [address] Phone: [phone]   Fax:  [phone]  Occupational Therapy Treatment  Patient Details  Name: Jerry Cain MRN: [id number] Date of Birth: May 22, 1931 Referring Provider:  Garald Balding, MD  Encounter Date: 11/24/2014      OT End of Session - 11/24/14 1153    Visit Number 4   Number of Visits 36   Date for OT Re-Evaluation 01/16/15  mini reassessment: 12/15/14   Authorization Type Medicare part A   Authorization Time Period before 10th visit`   Authorization - Visit Number 4   Authorization - Number of Visits 10   OT Start Time 1018   OT Stop Time 1059   OT Time Calculation (min) 41 min   Activity Tolerance Patient tolerated treatment well   Behavior During Therapy [facility] for tasks assessed/performed      Past Medical History  Diagnosis Date  . Hypercholesterolemia     takes Atorvastatin daily  . Gout   . BPH (benign prostatic hyperplasia)     takes Proscar daily  . CAD (coronary artery disease) 2009    cardiac cath and PTCA by Dr. Tami Ribas  . Tubular adenoma 2001  . Diverticulosis 2007    tcs by Dr. Laural Golden  . GERD (gastroesophageal reflux disease)   . Adenomatous colon polyp 2001  . Osteoarthritis     left knee  . Complication of anesthesia     hard to wake up  . Hypertension     takes Imdur,Coreg,and Lisinopril daily  . Atrial fibrillation     takes Coumadin daily  . Pneumonia     many,many yrs ago  . Numbness     right hand pointer and middle finger  . Carpal tunnel syndrome     right  . Joint pain   . Joint swelling   . Nocturia   . Hypothyroidism     takes SYnthroid daily  . Anxiety     Past Surgical History  Procedure Laterality Date  . Hernia repair  2009    left inguinal  . Cataract extraction      bilateral  . Colonoscopy  2007    Dr. Laural Golden- L sided diverticulosis. Next TCS 2012 due to h/o tubular adenoma.  . Colonoscopy  02/26/2011     Procedure: COLONOSCOPY;  Surgeon: Daneil Dolin, MD;  [facility];  Service: Endoscopy;  Laterality: N/A;  11:05  . Tee without cardioversion  12/12/2011    Procedure: TRANSESOPHAGEAL ECHOCARDIOGRAM (TEE);  Surgeon: Sanda Klein, MD;  Location: [facility];  Service: Cardiovascular;  Laterality: N/A;   successful/sinus rhythm  . Cardioversion  12/12/2011    Procedure: CARDIOVERSION;  Surgeon: Sanda Klein, MD;  Location: [facility];  Service: Cardiovascular;  Laterality: N/A;  . Bunionectomy Right 03/17/2013    Procedure: Lillard Anes, Arthroplasty 2nd toe right foot;  Surgeon: Marcheta Grammes, DPM;  [facility];  Service: Orthopedics;  Laterality: Right;  . Aiken osteotomy Right 03/17/2013    Procedure: Barbie Banner OSTEOTOMY;  Surgeon: Marcheta Grammes, DPM;  Location[facility];  Service: Orthopedics;  Laterality: Right;  . Flexor tenotomy Left 03/17/2013    Procedure: PERCUTANEOUS FLEXOR TENOTOMY 3RD TOE LEFT FOOT;  Surgeon: Marcheta Grammes, DPM;  Location[facility];  Service: Orthopedics;  Laterality: Left;  . Cardiac catheterization  06/11/2007    PTCA X2 in 06/2007:  2 overlapping Cypher stents to LAD (2.5x13 and 3.0x23)  . Cardiac catheterization  06/21/2007    Cypher stent 2.5 x 13  to OM branch  . Coronary angioplasty      x 3   . Total knee arthroplasty Left 06/14/2013    Procedure: TOTAL KNEE ARTHROPLASTY;  Surgeon: Garald Balding, MD;  Location: [place];  Service: Orthopedics;  Laterality: Left;  . Rot Left     There were no vitals filed for this visit.  Visit Diagnosis:  Pain in joint, shoulder region, left  Tight fascia  Shoulder stiffness, left  Shoulder weakness      Subjective Assessment - 11/24/14 1021    Subjective  S: I'm doing ok, this arthritis is giving me a fit though.    Currently in Pain? No/denies            [facility] OT Assessment - 11/24/14 1151    Assessment   Diagnosis Right shoulder RCR, SAD, DCR   Precautions    Precautions Shoulder   Type of Shoulder Precautions PROM for 4 weeks (12/07/14) then progress to AAROM (01/04/15). Progress to AROM and progress as tolerated.   Shoulder Interventions Shoulder sling/immobilizer;Off for dressing/bathing/exercises                  OT Treatments/Exercises (OP) - 11/24/14 1046    Exercises   Exercises Shoulder   Shoulder Exercises: Supine   Protraction PROM;10 reps   Horizontal ABduction PROM;10 reps   External Rotation PROM;10 reps   Internal Rotation PROM;10 reps   Flexion PROM;10 reps   ABduction PROM;10 reps   Shoulder Exercises: Seated   Elevation AROM;10 reps   Extension AROM;10 reps   Row AROM;10 reps   Shoulder Exercises: Therapy Ball   Flexion 10 reps   Flexion Limitations limited ROM   ABduction 10 reps   ABduction Limitations limited ROM   Shoulder Exercises: Isometric Strengthening   Flexion Supine;3X3"   Extension Supine;3X3"   External Rotation Supine;3X3"   Internal Rotation Supine;3X3"   ABduction Supine;3X3"   ADduction Supine;3X3"   Manual Therapy   Manual Therapy Edema management;Myofascial release   Edema Management Edema technique to LUE starting at hand and ending at upper arm region.   Myofascial Release Myofascial release to left elbow, upper arm, trapezius, and scapularis region to decrease fascial restrictions and increase joint mobility in a pain free zone.                   OT Short Term Goals - 11/20/14 1542    OT SHORT TERM GOAL #1   Title Patient will be education and independent with HEP.   Status On-going   OT SHORT TERM GOAL #2   Title Patient will increase PROM to [facility] to increase ability to get dressed with less difficulty.   Status On-going   OT SHORT TERM GOAL #3   Title Patient will increase strength to 3/5 to increase ability to complete daily tasks below shoulder height.    Status On-going   OT SHORT TERM GOAL #4   Title Patient will decrease fascial restrictions from max to min  amount.   Status On-going   OT SHORT TERM GOAL #5   Title Patient will decrease pain during daily tasks to 4/10.   Status On-going           OT Long Term Goals - 11/20/14 1543    OT LONG TERM GOAL #1   Title Patient will return to highest level of independence with all daily tasks using LUE.   Status On-going  OT LONG TERM GOAL #2   Title Patient will increase AROM to WNL to increase ability to complete daily tasks above shoulder height with less difficulty.    Status On-going   OT LONG TERM GOAL #3   Title Patient will increase LUE strength to 4/5 to increase ability to hold grandchildren.   Status On-going   OT LONG TERM GOAL #4   Title Patient will decrease fascial restrictions to trace amount.   Status On-going   OT LONG TERM GOAL #5   Title Patient will decrease pain level to 2/10 or less when completing daily tasks.   Status On-going               Plan - 11/24/14 1154    Clinical Impression Statement A: Pt reports increased soreness during PROM/AROM this session. Pt limited in PROM and required min assist during therapy ball abduction due to increased discomfort. Pt reports he took his sling off this morning when he was picking beans and let it hang down, also reports he moved it around a bit during his morning routine.    Plan P: Cont working to increase joint mobility during PROM. Add shoulder glides.         Problem List Patient Active Problem List   Diagnosis Date Noted  . Arthritis of knee, left 06/16/2013  . S/P total knee replacement using cement 06/14/2013  . Osteoarthritis of left knee 03/21/2013  . BPH (benign prostatic hyperplasia)   . GERD (gastroesophageal reflux disease)   . CAD (coronary artery disease)   . Fever 03/20/2013  . Altered mental state 03/20/2013  . Postoperative fever 03/20/2013  . PNA (pneumonia) 03/20/2013  . Preop cardiovascular exam 11/24/2012  . Peroneal tendonitis 10/05/2012  . Arthritis of foot, degenerative  10/05/2012  . HTN (hypertension) 09/25/2012  . Hyperlipidemia 09/25/2012  . Atrial fibrillation 06/15/2012  . Long term (current) use of anticoagulants 06/15/2012  . Rotator cuff strain 02/04/2012  . Shoulder pain 02/04/2012  . Personal history of colonic polyps 01/29/2011  . High risk medication use 01/29/2011    Guadelupe Sabin, OTR/L  [phone]  11/24/2014, 11:57 AM  [facility] [place], [place], [address] Phone: [phone]   Fax:  [phone]

## 2014-11-29 ENCOUNTER — Ambulatory Visit (HOSPITAL_COMMUNITY): Payer: Medicare Other | Admitting: Specialist

## 2014-11-29 DIAGNOSIS — M25512 Pain in left shoulder: Secondary | ICD-10-CM | POA: Diagnosis not present

## 2014-11-29 DIAGNOSIS — M25612 Stiffness of left shoulder, not elsewhere classified: Secondary | ICD-10-CM | POA: Diagnosis not present

## 2014-11-29 DIAGNOSIS — Z9889 Other specified postprocedural states: Secondary | ICD-10-CM | POA: Diagnosis not present

## 2014-11-29 DIAGNOSIS — M629 Disorder of muscle, unspecified: Secondary | ICD-10-CM | POA: Diagnosis not present

## 2014-11-29 DIAGNOSIS — R29898 Other symptoms and signs involving the musculoskeletal system: Secondary | ICD-10-CM

## 2014-11-29 DIAGNOSIS — M19022 Primary osteoarthritis, left elbow: Secondary | ICD-10-CM | POA: Diagnosis not present

## 2014-11-29 NOTE — Therapy (Signed)
[place], [place], [address] Phone: [phone]   Fax:  [phone]  Occupational Therapy Treatment  Patient Details  Name: Jerry Cain MRN: [id number] Date of Birth: 12-Oct-1931 Referring Provider:  Octavio Graves, DO  Encounter Date: 11/29/2014      OT End of Session - 11/29/14 1019    Visit Number 5   Number of Visits 36   Date for OT Re-Evaluation 01/16/15  12/15/14 mini reassess   Authorization Type Medicare part A   Authorization Time Period before 10th visit`   Authorization - Visit Number 5   Authorization - Number of Visits 10   OT Start Time [phone]   OT Stop Time 1020   OT Time Calculation (min) 43 min   Activity Tolerance Patient tolerated treatment well   Behavior During Therapy [facility] for tasks assessed/performed      Past Medical History  Diagnosis Date  . Hypercholesterolemia     takes Atorvastatin daily  . Gout   . BPH (benign prostatic hyperplasia)     takes Proscar daily  . CAD (coronary artery disease) 2009    cardiac cath and PTCA by Dr. Tami Ribas  . Tubular adenoma 2001  . Diverticulosis 2007    tcs by Dr. Laural Golden  . GERD (gastroesophageal reflux disease)   . Adenomatous colon polyp 2001  . Osteoarthritis     left knee  . Complication of anesthesia     hard to wake up  . Hypertension     takes Imdur,Coreg,and Lisinopril daily  . Atrial fibrillation     takes Coumadin daily  . Pneumonia     many,many yrs ago  . Numbness     right hand pointer and middle finger  . Carpal tunnel syndrome     right  . Joint pain   . Joint swelling   . Nocturia   . Hypothyroidism     takes SYnthroid daily  . Anxiety     Past Surgical History  Procedure Laterality Date  . Hernia repair  2009    left inguinal  . Cataract extraction      bilateral  . Colonoscopy  2007    Dr. Laural Golden- L sided diverticulosis. Next TCS 2012 due to h/o tubular adenoma.  . Colonoscopy  02/26/2011   Procedure: COLONOSCOPY;  Surgeon: Daneil Dolin, MD;  [facility];  Service: Endoscopy;  Laterality: N/A;  11:05  . Tee without cardioversion  12/12/2011    Procedure: TRANSESOPHAGEAL ECHOCARDIOGRAM (TEE);  Surgeon: Sanda Klein, MD;  Location: [facility] ENDOSCOPY;  Service: Cardiovascular;  Laterality: N/A;   successful/sinus rhythm  . Cardioversion  12/12/2011    Procedure: CARDIOVERSION;  Surgeon: Sanda Klein, MD;  Location: [place];  Service: Cardiovascular;  Laterality: N/A;  . Bunionectomy Right 03/17/2013    Procedure: Lillard Anes, Arthroplasty 2nd toe right foot;  Surgeon: Marcheta Grammes, DPM;  Location: [facility];  Service: Orthopedics;  Laterality: Right;  . Aiken osteotomy Right 03/17/2013    Procedure: Barbie Banner OSTEOTOMY;  Surgeon: Marcheta Grammes, DPM;  Location: [facility];  Service: Orthopedics;  Laterality: Right;  . Flexor tenotomy Left 03/17/2013    Procedure: PERCUTANEOUS FLEXOR TENOTOMY 3RD TOE LEFT FOOT;  Surgeon: Marcheta Grammes, DPM;  Location: [facility];  Service: Orthopedics;  Laterality: Left;  . Cardiac catheterization  06/11/2007    PTCA X2 in 06/2007:  2 overlapping Cypher stents to LAD (2.5x13 and 3.0x23)  . Cardiac catheterization  06/21/2007  Cypher stent 2.5 x 13  to OM branch  . Coronary angioplasty      x 3   . Total knee arthroplasty Left 06/14/2013    Procedure: TOTAL KNEE ARTHROPLASTY;  Surgeon: Garald Balding, MD;  Location: [place];  Service: Orthopedics;  Laterality: Left;  . Rot Left     There were no vitals filed for this visit.  Visit Diagnosis:  Pain in joint, shoulder region, left  Shoulder stiffness, left  Shoulder weakness      Subjective Assessment - 11/29/14 0941    Subjective  S:  The elbow is worse than anything.  Last week this move (external rotation) really hurt.    Currently in Pain? No/denies   Pain Location Shoulder   Pain Orientation Left            [facility] OT Assessment - 11/29/14 0001     Assessment   Diagnosis Left Shoulder RCR, SAD, DCR   Precautions   Precautions Shoulder   Type of Shoulder Precautions PROM for 4 weeks (12/07/14) then progress to AAROM (01/04/15). Progress to AROM and progress as tolerated.   Shoulder Interventions Shoulder sling/immobilizer;Off for dressing/bathing/exercises   Edema   Edema MCPJs:  right 23.0 cm L:  23.4 cm  1" distal to elbow crease R:  28 cm L:  35 cm    PROM   Overall PROM Comments assessed in supine ER/IR in adducted position (11/17/14)   PROM Assessment Site Shoulder   Right/Left Shoulder Left   Left Shoulder Flexion 90 Degrees  45   Left Shoulder ABduction 75 Degrees  56   Left Shoulder Internal Rotation 90 Degrees  90   Left Shoulder External Rotation 40 Degrees  21   Right/Left Elbow Left   Left Elbow Flexion 118  63   Left Elbow Extension -28  -43   Left Forearm Pronation 90 Degrees  90   Left Forearm Supination 67 Degrees  67                  OT Treatments/Exercises (OP) - 11/29/14 0001    Exercises   Exercises Shoulder   Shoulder Exercises: Supine   Protraction PROM;10 reps   Horizontal ABduction Limitations hold   External Rotation PROM;10 reps   Internal Rotation PROM;10 reps   Flexion PROM;10 reps   ABduction PROM;10 reps   Other Supine Exercises elbow, forearm, wrist AROM 10 times seated   Other Supine Exercises tendon glides 10 times with min facilitation    Shoulder Exercises: Isometric Strengthening   Flexion Supine;3X3"   Extension Supine;3X3"   External Rotation Supine;3X3"   Internal Rotation Supine;3X3"   ABduction Supine;3X3"   ADduction Supine;3X3"   Manual Therapy   Manual Therapy Edema management;Myofascial release   Edema Management Edema technique to LUE starting at hand and ending at upper arm region.   Myofascial Release Myofascial release to left elbow, upper arm, trapezius, and scapularis region to decrease fascial restrictions and increase joint mobility in a pain free  zone.                 OT Education - 11/29/14 1018    Education provided Yes   Education Details tendon glides for edema reduction   Person(s) Educated Patient   Methods Explanation;Demonstration;Handout   Comprehension Verbalized understanding;Returned demonstration          OT Short Term Goals - 11/20/14 1542    OT SHORT TERM GOAL #1   Title Patient will be education  and independent with HEP.   Status On-going   OT SHORT TERM GOAL #2   Title Patient will increase PROM to [facility] to increase ability to get dressed with less difficulty.   Status On-going   OT SHORT TERM GOAL #3   Title Patient will increase strength to 3/5 to increase ability to complete daily tasks below shoulder height.    Status On-going   OT SHORT TERM GOAL #4   Title Patient will decrease fascial restrictions from max to min amount.   Status On-going   OT SHORT TERM GOAL #5   Title Patient will decrease pain during daily tasks to 4/10.   Status On-going           OT Long Term Goals - 11/20/14 1543    OT LONG TERM GOAL #1   Title Patient will return to highest level of independence with all daily tasks using LUE.   Status On-going   OT LONG TERM GOAL #2   Title Patient will increase AROM to WNL to increase ability to complete daily tasks above shoulder height with less difficulty.    Status On-going   OT LONG TERM GOAL #3   Title Patient will increase LUE strength to 4/5 to increase ability to hold grandchildren.   Status On-going   OT LONG TERM GOAL #4   Title Patient will decrease fascial restrictions to trace amount.   Status On-going   OT LONG TERM GOAL #5   Title Patient will decrease pain level to 2/10 or less when completing daily tasks.   Status On-going               Plan - 11/29/14 1020    Clinical Impression Statement A:  Patient continues to have increased pain and edema in elbow and forearm and hand.  Edema and pain is limiting progress with shoulder.     Plan P:   Continue per MD protocol focusing on improving PROM to [facility] and decreasing edema.         Problem List Patient Active Problem List   Diagnosis Date Noted  . Arthritis of knee, left 06/16/2013  . S/P total knee replacement using cement 06/14/2013  . Osteoarthritis of left knee 03/21/2013  . BPH (benign prostatic hyperplasia)   . GERD (gastroesophageal reflux disease)   . CAD (coronary artery disease)   . Fever 03/20/2013  . Altered mental state 03/20/2013  . Postoperative fever 03/20/2013  . PNA (pneumonia) 03/20/2013  . Preop cardiovascular exam 11/24/2012  . Peroneal tendonitis 10/05/2012  . Arthritis of foot, degenerative 10/05/2012  . HTN (hypertension) 09/25/2012  . Hyperlipidemia 09/25/2012  . Atrial fibrillation 06/15/2012  . Long term (current) use of anticoagulants 06/15/2012  . Rotator cuff strain 02/04/2012  . Shoulder pain 02/04/2012  . Personal history of colonic polyps 01/29/2011  . High risk medication use 01/29/2011    Vangie Bicker, OTR/L [phone]  11/29/2014, 10:22 AM  [place], [place], [address] Phone: [phone]   Fax:  [phone]

## 2014-12-01 ENCOUNTER — Ambulatory Visit (HOSPITAL_COMMUNITY): Payer: Medicare Other | Admitting: Specialist

## 2014-12-01 DIAGNOSIS — R29898 Other symptoms and signs involving the musculoskeletal system: Secondary | ICD-10-CM

## 2014-12-01 DIAGNOSIS — M629 Disorder of muscle, unspecified: Secondary | ICD-10-CM | POA: Diagnosis not present

## 2014-12-01 DIAGNOSIS — M25512 Pain in left shoulder: Secondary | ICD-10-CM | POA: Diagnosis not present

## 2014-12-01 DIAGNOSIS — Z9889 Other specified postprocedural states: Secondary | ICD-10-CM | POA: Diagnosis not present

## 2014-12-01 DIAGNOSIS — M25612 Stiffness of left shoulder, not elsewhere classified: Secondary | ICD-10-CM | POA: Diagnosis not present

## 2014-12-01 NOTE — Therapy (Signed)
[place], [place], [address] Phone: [phone]   Fax:  [phone]  Occupational Therapy Treatment  Patient Details  Name: Jerry Cain MRN: [id number] Date of Birth: Mar 21, 1932 Referring Provider:  Garald Balding, MD  Encounter Date: 12/01/2014      OT End of Session - 12/01/14 1221    Visit Number 6   Number of Visits 36   Date for OT Re-Evaluation 01/16/15  12/15/14   Authorization Type Medicare part A   Authorization Time Period before 10th visit`   Authorization - Visit Number 6   Authorization - Number of Visits 10   OT Start Time 0940   OT Stop Time 1025   OT Time Calculation (min) 45 min   Activity Tolerance Patient tolerated treatment well   Behavior During Therapy [facility] for tasks assessed/performed      Past Medical History  Diagnosis Date  . Hypercholesterolemia     takes Atorvastatin daily  . Gout   . BPH (benign prostatic hyperplasia)     takes Proscar daily  . CAD (coronary artery disease) 2009    cardiac cath and PTCA by Dr. Tami Ribas  . Tubular adenoma 2001  . Diverticulosis 2007    tcs by Dr. Laural Golden  . GERD (gastroesophageal reflux disease)   . Adenomatous colon polyp 2001  . Osteoarthritis     left knee  . Complication of anesthesia     hard to wake up  . Hypertension     takes Imdur,Coreg,and Lisinopril daily  . Atrial fibrillation     takes Coumadin daily  . Pneumonia     many,many yrs ago  . Numbness     right hand pointer and middle finger  . Carpal tunnel syndrome     right  . Joint pain   . Joint swelling   . Nocturia   . Hypothyroidism     takes SYnthroid daily  . Anxiety     Past Surgical History  Procedure Laterality Date  . Hernia repair  2009    left inguinal  . Cataract extraction      bilateral  . Colonoscopy  2007    Dr. Laural Golden- L sided diverticulosis. Next TCS 2012 due to h/o tubular adenoma.  . Colonoscopy  02/26/2011    Procedure:  COLONOSCOPY;  Surgeon: Daneil Dolin, MD;  [facility];  Service: Endoscopy;  Laterality: N/A;  11:05  . Tee without cardioversion  12/12/2011    Procedure: TRANSESOPHAGEAL ECHOCARDIOGRAM (TEE);  Surgeon: Sanda Klein, MD;  [facility] ENDOSCOPY;  Service: Cardiovascular;  Laterality: N/A;   successful/sinus rhythm  . Cardioversion  12/12/2011    Procedure: CARDIOVERSION;  Surgeon: Sanda Klein, MD;  Location: [place];  Service: Cardiovascular;  Laterality: N/A;  . Bunionectomy Right 03/17/2013    Procedure: Lillard Anes, Arthroplasty 2nd toe right foot;  Surgeon: Marcheta Grammes, DPM;  [facility];  Service: Orthopedics;  Laterality: Right;  . Aiken osteotomy Right 03/17/2013    Procedure: Barbie Banner OSTEOTOMY;  Surgeon: Marcheta Grammes, DPM;  [facility];  Service: Orthopedics;  Laterality: Right;  . Flexor tenotomy Left 03/17/2013    Procedure: PERCUTANEOUS FLEXOR TENOTOMY 3RD TOE LEFT FOOT;  Surgeon: Marcheta Grammes, DPM;  [facility];  Service: Orthopedics;  Laterality: Left;  . Cardiac catheterization  06/11/2007    PTCA X2 in 06/2007:  2 overlapping Cypher stents to LAD (2.5x13 and 3.0x23)  . Cardiac catheterization  06/21/2007  Cypher stent 2.5 x 13  to OM branch  . Coronary angioplasty      x 3   . Total knee arthroplasty Left 06/14/2013    Procedure: TOTAL KNEE ARTHROPLASTY;  Surgeon: Garald Balding, MD;  Location: [place];  Service: Orthopedics;  Laterality: Left;  . Rot Left     There were no vitals filed for this visit.  Visit Diagnosis:  Pain in joint, shoulder region, left  Shoulder weakness      Subjective Assessment - 12/01/14 1219    Subjective  S:  I had a cortisone shot in my elbow and it feels so much better now.   Currently in Pain? Yes   Pain Score 3    Pain Location Shoulder   Pain Orientation Left   Pain Descriptors / Indicators Aching   Pain Type Acute pain            [facility] OT Assessment -  12/01/14 0001    Assessment   Diagnosis Left Shoulder RCR, SAD, DCR   Precautions   Precautions Shoulder   Type of Shoulder Precautions PROM for 4 weeks (12/07/14) then progress to AAROM (01/04/15). Progress to AROM and progress as tolerated.   Shoulder Interventions Shoulder sling/immobilizer;Off for dressing/bathing/exercises                  OT Treatments/Exercises (OP) - 12/01/14 0001    Shoulder Exercises: Supine   Protraction PROM;10 reps   Horizontal ABduction PROM;10 reps   Horizontal ABduction Limitations hold   External Rotation PROM;10 reps   Internal Rotation PROM;10 reps   Flexion PROM;10 reps   ABduction PROM;10 reps   Shoulder Exercises: Seated   Elevation AROM;10 reps   Extension AROM;10 reps   Row AROM;10 reps   Shoulder Exercises: Therapy Ball   Flexion 15 reps   ABduction 15 reps   Shoulder Exercises: Isometric Strengthening   Flexion Supine;5X5"   Extension Supine;5X5"   External Rotation Supine;5X5"   Internal Rotation Supine;5X5"   ABduction Supine;5X5"   ADduction Supine;5X5"   Manual Therapy   Manual Therapy Edema management;Myofascial release   Edema Management Edema technique to LUE starting at hand and ending at upper arm region.   Myofascial Release Myofascial release to left elbow, upper arm, trapezius, and scapularis region to decrease fascial restrictions and increase joint mobility in a pain free zone.                   OT Short Term Goals - 11/20/14 1542    OT SHORT TERM GOAL #1   Title Patient will be education and independent with HEP.   Status On-going   OT SHORT TERM GOAL #2   Title Patient will increase PROM to [facility] to increase ability to get dressed with less difficulty.   Status On-going   OT SHORT TERM GOAL #3   Title Patient will increase strength to 3/5 to increase ability to complete daily tasks below shoulder height.    Status On-going   OT SHORT TERM GOAL #4   Title Patient will decrease fascial  restrictions from max to min amount.   Status On-going   OT SHORT TERM GOAL #5   Title Patient will decrease pain during daily tasks to 4/10.   Status On-going           OT Long Term Goals - 11/20/14 1543    OT LONG TERM GOAL #1   Title Patient will return to highest level of independence with  all daily tasks using LUE.   Status On-going   OT LONG TERM GOAL #2   Title Patient will increase AROM to WNL to increase ability to complete daily tasks above shoulder height with less difficulty.    Status On-going   OT LONG TERM GOAL #3   Title Patient will increase LUE strength to 4/5 to increase ability to hold grandchildren.   Status On-going   OT LONG TERM GOAL #4   Title Patient will decrease fascial restrictions to trace amount.   Status On-going   OT LONG TERM GOAL #5   Title Patient will decrease pain level to 2/10 or less when completing daily tasks.   Status On-going               Plan - 12/01/14 1222    Clinical Impression Statement A:  Patient received a cortisone injection which significantly decreased his edema, left elbow edema is decreased to 27 cm from 37 cm on tuesday.   Plan P:  Improve PROM to [facility] and continue per MD protocol.  Add thumbtacks and prot/ret//elev/dep this date.        Problem List Patient Active Problem List   Diagnosis Date Noted  . Arthritis of knee, left 06/16/2013  . S/P total knee replacement using cement 06/14/2013  . Osteoarthritis of left knee 03/21/2013  . BPH (benign prostatic hyperplasia)   . GERD (gastroesophageal reflux disease)   . CAD (coronary artery disease)   . Fever 03/20/2013  . Altered mental state 03/20/2013  . Postoperative fever 03/20/2013  . PNA (pneumonia) 03/20/2013  . Preop cardiovascular exam 11/24/2012  . Peroneal tendonitis 10/05/2012  . Arthritis of foot, degenerative 10/05/2012  . HTN (hypertension) 09/25/2012  . Hyperlipidemia 09/25/2012  . Atrial fibrillation 06/15/2012  . Long term (current)  use of anticoagulants 06/15/2012  . Rotator cuff strain 02/04/2012  . Shoulder pain 02/04/2012  . Personal history of colonic polyps 01/29/2011  . High risk medication use 01/29/2011    Vangie Bicker, OTR/L [phone]  12/01/2014, 12:27 PM  [place] [address] [place], [place], [address] Phone: [phone]   Fax:  [phone]

## 2014-12-02 ENCOUNTER — Other Ambulatory Visit: Payer: Self-pay | Admitting: Cardiovascular Disease

## 2014-12-04 ENCOUNTER — Ambulatory Visit (HOSPITAL_COMMUNITY): Payer: Medicare Other | Admitting: Specialist

## 2014-12-04 DIAGNOSIS — M25512 Pain in left shoulder: Secondary | ICD-10-CM

## 2014-12-04 DIAGNOSIS — R29898 Other symptoms and signs involving the musculoskeletal system: Secondary | ICD-10-CM

## 2014-12-04 DIAGNOSIS — Z9889 Other specified postprocedural states: Secondary | ICD-10-CM | POA: Diagnosis not present

## 2014-12-04 DIAGNOSIS — M25612 Stiffness of left shoulder, not elsewhere classified: Secondary | ICD-10-CM

## 2014-12-04 DIAGNOSIS — M629 Disorder of muscle, unspecified: Secondary | ICD-10-CM | POA: Diagnosis not present

## 2014-12-04 NOTE — Therapy (Signed)
[place], [place], [address] Phone: [phone]   Fax:  [phone]  Occupational Therapy Treatment  Patient Details  Name: Jerry Cain MRN: [id number] Date of Birth: Apr 17, 1931 Referring Provider:  Garald Balding, MD  Encounter Date: 12/04/2014      OT End of Session - 12/04/14 1010    Visit Number 7   Number of Visits 36   Date for OT Re-Evaluation 01/16/15  mini reassess 12/15/14   Authorization Type Medicare part A   Authorization Time Period before 10th visit   Authorization - Visit Number 7   Authorization - Number of Visits 10   OT Start Time 0932   OT Stop Time 1020   OT Time Calculation (min) 48 min   Activity Tolerance Patient tolerated treatment well   Behavior During Therapy [facility] for tasks assessed/performed      Past Medical History  Diagnosis Date  . Hypercholesterolemia     takes Atorvastatin daily  . Gout   . BPH (benign prostatic hyperplasia)     takes Proscar daily  . CAD (coronary artery disease) 2009    cardiac cath and PTCA by Dr. Tami Ribas  . Tubular adenoma 2001  . Diverticulosis 2007    tcs by Dr. Laural Golden  . GERD (gastroesophageal reflux disease)   . Adenomatous colon polyp 2001  . Osteoarthritis     left knee  . Complication of anesthesia     hard to wake up  . Hypertension     takes Imdur,Coreg,and Lisinopril daily  . Atrial fibrillation     takes Coumadin daily  . Pneumonia     many,many yrs ago  . Numbness     right hand pointer and middle finger  . Carpal tunnel syndrome     right  . Joint pain   . Joint swelling   . Nocturia   . Hypothyroidism     takes SYnthroid daily  . Anxiety     Past Surgical History  Procedure Laterality Date  . Hernia repair  2009    left inguinal  . Cataract extraction      bilateral  . Colonoscopy  2007    Dr. Laural Golden- L sided diverticulosis. Next TCS 2012 due to h/o tubular adenoma.  . Colonoscopy  02/26/2011   Procedure: COLONOSCOPY;  Surgeon: Daneil Dolin, MD;  [facility];  Service: Endoscopy;  Laterality: N/A;  11:05  . Tee without cardioversion  12/12/2011    Procedure: TRANSESOPHAGEAL ECHOCARDIOGRAM (TEE);  Surgeon: Sanda Klein, MD;  Location: [facility] ENDOSCOPY;  Service: Cardiovascular;  Laterality: N/A;   successful/sinus rhythm  . Cardioversion  12/12/2011    Procedure: CARDIOVERSION;  Surgeon: Sanda Klein, MD;  Location: [place];  Service: Cardiovascular;  Laterality: N/A;  . Bunionectomy Right 03/17/2013    Procedure: Lillard Anes, Arthroplasty 2nd toe right foot;  Surgeon: Marcheta Grammes, DPM;  Location: [facility];  Service: Orthopedics;  Laterality: Right;  . Aiken osteotomy Right 03/17/2013    Procedure: Barbie Banner OSTEOTOMY;  Surgeon: Marcheta Grammes, DPM;  Location: [facility];  Service: Orthopedics;  Laterality: Right;  . Flexor tenotomy Left 03/17/2013    Procedure: PERCUTANEOUS FLEXOR TENOTOMY 3RD TOE LEFT FOOT;  Surgeon: Marcheta Grammes, DPM;  Location: [facility];  Service: Orthopedics;  Laterality: Left;  . Cardiac catheterization  06/11/2007    PTCA X2 in 06/2007:  2 overlapping Cypher stents to LAD (2.5x13 and 3.0x23)  . Cardiac catheterization  06/21/2007  Cypher stent 2.5 x 13  to OM branch  . Coronary angioplasty      x 3   . Total knee arthroplasty Left 06/14/2013    Procedure: TOTAL KNEE ARTHROPLASTY;  Surgeon: Garald Balding, MD;  Location: [place];  Service: Orthopedics;  Laterality: Left;  . Rot Left     There were no vitals filed for this visit.  Visit Diagnosis:  Pain in joint, shoulder region, left  Shoulder weakness  Shoulder stiffness, left      Subjective Assessment - 12/04/14 0932    Subjective  S:  Right now its not hurting.  The elbow still hurts some, but not as bad as it was.   Currently in Pain? No/denies            [facility] OT Assessment - 12/04/14 0001    Assessment   Diagnosis Left Shoulder RCR, SAD, DCR    Precautions   Precautions Shoulder   Type of Shoulder Precautions PROM for 4 weeks (12/07/14) then progress to AAROM (01/04/15). Progress to AROM and progress as tolerated.   Shoulder Interventions Shoulder sling/immobilizer;Off for dressing/bathing/exercises                  OT Treatments/Exercises (OP) - 12/04/14 0001    Exercises   Exercises Shoulder   Shoulder Exercises: Supine   Protraction PROM;10 reps   Horizontal ABduction PROM;10 reps   External Rotation PROM;10 reps   Internal Rotation PROM;10 reps   Flexion PROM;10 reps   ABduction PROM;10 reps   Other Supine Exercises elbow, forearm, wrist AROM 10 times seated   Other Supine Exercises serratus anterior punch 10 times with mod facilitation from OT   Shoulder Exercises: Seated   Elevation AROM;10 reps   Extension AROM;10 reps   Row AROM;10 reps   Shoulder Exercises: Therapy Ball   Flexion 15 reps   ABduction 15 reps   Shoulder Exercises: ROM/Strengthening   Prot/Ret//Elev/Dep 1 min low position on wall with mod facilitation from OT for positioning and technique   Shoulder Exercises: Isometric Strengthening   Flexion Supine;5X5"   Extension Supine;5X5"   External Rotation Supine;5X5"   Internal Rotation Supine;5X5"   ABduction Supine;5X5"   ADduction Supine;5X5"   Manual Therapy   Manual Therapy Edema management;Myofascial release   Edema Management Edema technique to LUE starting at hand and ending at upper arm region.   Myofascial Release Myofascial release to left elbow, upper arm, trapezius, and scapularis region to decrease fascial restrictions and increase joint mobility in a pain free zone.                   OT Short Term Goals - 11/20/14 1542    OT SHORT TERM GOAL #1   Title Patient will be education and independent with HEP.   Status On-going   OT SHORT TERM GOAL #2   Title Patient will increase PROM to [facility] to increase ability to get dressed with less difficulty.   Status On-going    OT SHORT TERM GOAL #3   Title Patient will increase strength to 3/5 to increase ability to complete daily tasks below shoulder height.    Status On-going   OT SHORT TERM GOAL #4   Title Patient will decrease fascial restrictions from max to min amount.   Status On-going   OT SHORT TERM GOAL #5   Title Patient will decrease pain during daily tasks to 4/10.   Status On-going  OT Long Term Goals - 11/20/14 1543    OT LONG TERM GOAL #1   Title Patient will return to highest level of independence with all daily tasks using LUE.   Status On-going   OT LONG TERM GOAL #2   Title Patient will increase AROM to WNL to increase ability to complete daily tasks above shoulder height with less difficulty.    Status On-going   OT LONG TERM GOAL #3   Title Patient will increase LUE strength to 4/5 to increase ability to hold grandchildren.   Status On-going   OT LONG TERM GOAL #4   Title Patient will decrease fascial restrictions to trace amount.   Status On-going   OT LONG TERM GOAL #5   Title Patient will decrease pain level to 2/10 or less when completing daily tasks.   Status On-going               Plan - 12/04/14 1043    Clinical Impression Statement A:  Patient able to complete prot/ret//elev/dep wtih mod pa this date.  Patient reports that this exercise assisted him with increased range on ball stretch this date.    Plan P:  Increase to 20 reps with ball stretch, add thumbtacks, increase contraction time with isometric strengthening.        Problem List Patient Active Problem List   Diagnosis Date Noted  . Arthritis of knee, left 06/16/2013  . S/P total knee replacement using cement 06/14/2013  . Osteoarthritis of left knee 03/21/2013  . BPH (benign prostatic hyperplasia)   . GERD (gastroesophageal reflux disease)   . CAD (coronary artery disease)   . Fever 03/20/2013  . Altered mental state 03/20/2013  . Postoperative fever 03/20/2013  . PNA (pneumonia)  03/20/2013  . Preop cardiovascular exam 11/24/2012  . Peroneal tendonitis 10/05/2012  . Arthritis of foot, degenerative 10/05/2012  . HTN (hypertension) 09/25/2012  . Hyperlipidemia 09/25/2012  . Atrial fibrillation 06/15/2012  . Long term (current) use of anticoagulants 06/15/2012  . Rotator cuff strain 02/04/2012  . Shoulder pain 02/04/2012  . Personal history of colonic polyps 01/29/2011  . High risk medication use 01/29/2011    Vangie Bicker, OTR/L [phone]  12/04/2014, 10:47 AM  [place], [place], [address] Phone: [phone]   Fax:  [phone]

## 2014-12-04 NOTE — Telephone Encounter (Signed)
REFILL 

## 2014-12-06 ENCOUNTER — Ambulatory Visit (HOSPITAL_COMMUNITY): Payer: Medicare Other | Admitting: Occupational Therapy

## 2014-12-06 ENCOUNTER — Encounter (HOSPITAL_COMMUNITY): Payer: Self-pay | Admitting: Occupational Therapy

## 2014-12-06 DIAGNOSIS — M6289 Other specified disorders of muscle: Secondary | ICD-10-CM

## 2014-12-06 DIAGNOSIS — M25612 Stiffness of left shoulder, not elsewhere classified: Secondary | ICD-10-CM

## 2014-12-06 DIAGNOSIS — M25512 Pain in left shoulder: Secondary | ICD-10-CM | POA: Diagnosis not present

## 2014-12-06 DIAGNOSIS — M629 Disorder of muscle, unspecified: Secondary | ICD-10-CM | POA: Diagnosis not present

## 2014-12-06 DIAGNOSIS — R29898 Other symptoms and signs involving the musculoskeletal system: Secondary | ICD-10-CM | POA: Diagnosis not present

## 2014-12-06 DIAGNOSIS — N401 Enlarged prostate with lower urinary tract symptoms: Secondary | ICD-10-CM | POA: Diagnosis not present

## 2014-12-06 DIAGNOSIS — Z9889 Other specified postprocedural states: Secondary | ICD-10-CM | POA: Diagnosis not present

## 2014-12-06 NOTE — Therapy (Signed)
[place], [place], [address] Phone: [phone]   Fax:  [phone]  Occupational Therapy Treatment  Patient Details  Name: Jerry Cain MRN: [id number] Date of Birth: [birth year] Referring Provider:  Garald Balding, MD  Encounter Date: 12/06/2014      OT End of Session - 12/06/14 1224    Visit Number 8   Number of Visits 36   Date for OT Re-Evaluation 01/16/15  mini reassess 12/15/14   Authorization Type Medicare part A   Authorization Time Period before 10th visit   Authorization - Visit Number 8   Authorization - Number of Visits 10   OT Start Time 0850   OT Stop Time 0931   OT Time Calculation (min) 41 min   Activity Tolerance Patient tolerated treatment well   Behavior During Therapy [facility] for tasks assessed/performed      Past Medical History  Diagnosis Date  . Hypercholesterolemia     takes Atorvastatin daily  . Gout   . BPH (benign prostatic hyperplasia)     takes Proscar daily  . CAD (coronary artery disease) 2009    cardiac cath and PTCA by Dr. Tami Ribas  . Tubular adenoma 2001  . Diverticulosis 2007    tcs by Dr. Laural Golden  . GERD (gastroesophageal reflux disease)   . Adenomatous colon polyp 2001  . Osteoarthritis     left knee  . Complication of anesthesia     hard to wake up  . Hypertension     takes Imdur,Coreg,and Lisinopril daily  . Atrial fibrillation     takes Coumadin daily  . Pneumonia     many,many yrs ago  . Numbness     right hand pointer and middle finger  . Carpal tunnel syndrome     right  . Joint pain   . Joint swelling   . Nocturia   . Hypothyroidism     takes SYnthroid daily  . Anxiety     Past Surgical History  Procedure Laterality Date  . Hernia repair  2009    left inguinal  . Cataract extraction      bilateral  . Colonoscopy  2007    Dr. Laural Golden- L sided diverticulosis. Next TCS 2012 due to h/o tubular adenoma.  . Colonoscopy  02/26/2011     Procedure: COLONOSCOPY;  Surgeon: Daneil Dolin, MD;  [facility];  Service: Endoscopy;  Laterality: N/A;  11:05  . Tee without cardioversion  12/12/2011    Procedure: TRANSESOPHAGEAL ECHOCARDIOGRAM (TEE);  Surgeon: Sanda Klein, MD;  Location: [facility] ENDOSCOPY;  Service: Cardiovascular;  Laterality: N/A;   successful/sinus rhythm  . Cardioversion  12/12/2011    Procedure: CARDIOVERSION;  Surgeon: Sanda Klein, MD;  Location: [place];  Service: Cardiovascular;  Laterality: N/A;  . Bunionectomy Right 03/17/2013    Procedure: Lillard Anes, Arthroplasty 2nd toe right foot;  Surgeon: Marcheta Grammes, DPM;  Location: [facility];  Service: Orthopedics;  Laterality: Right;  . Aiken osteotomy Right 03/17/2013    Procedure: Barbie Banner OSTEOTOMY;  Surgeon: Marcheta Grammes, DPM;  Location: [facility];  Service: Orthopedics;  Laterality: Right;  . Flexor tenotomy Left 03/17/2013    Procedure: PERCUTANEOUS FLEXOR TENOTOMY 3RD TOE LEFT FOOT;  Surgeon: Marcheta Grammes, DPM;  Location: [facility];  Service: Orthopedics;  Laterality: Left;  . Cardiac catheterization  06/11/2007    PTCA X2 in 06/2007:  2 overlapping Cypher stents to LAD (2.5x13 and 3.0x23)  . Cardiac catheterization  06/21/2007    Cypher stent 2.5 x 13  to OM branch  . Coronary angioplasty      x 3   . Total knee arthroplasty Left 06/14/2013    Procedure: TOTAL KNEE ARTHROPLASTY;  Surgeon: Garald Balding, MD;  Location: [place];  Service: Orthopedics;  Laterality: Left;  . Rot Left     There were no vitals filed for this visit.  Visit Diagnosis:  Pain in joint, shoulder region, left  Shoulder weakness  Shoulder stiffness, left  Tight fascia      Subjective Assessment - 12/06/14 0853    Subjective  S: My shoulder is not bothering me at all, it's my elbow.    Currently in Pain? Yes   Pain Score 6    Pain Location Elbow   Pain Orientation Left   Pain Descriptors / Indicators Aching   Pain Type Acute pain             [facility] OT Assessment - 12/06/14 1222    Assessment   Diagnosis Left Shoulder RCR, SAD, DCR   Precautions   Precautions Shoulder   Type of Shoulder Precautions PROM for 4 weeks (12/07/14) then progress to AAROM (01/04/15). Progress to AROM and progress as tolerated.   Shoulder Interventions Shoulder sling/immobilizer;Off for dressing/bathing/exercises                  OT Treatments/Exercises (OP) - 12/06/14 0913    Exercises   Exercises Shoulder   Shoulder Exercises: Supine   Protraction PROM;10 reps   Horizontal ABduction PROM;10 reps   External Rotation PROM;10 reps   Internal Rotation PROM;10 reps   Flexion PROM;10 reps   ABduction PROM;10 reps   Shoulder Exercises: Seated   Elevation AROM;15 reps   Extension AROM;15 reps   Row AROM;15 reps   Shoulder Exercises: Therapy Ball   Flexion 20 reps   ABduction 20 reps   Shoulder Exercises: ROM/Strengthening   Prot/Ret//Elev/Dep 1 min low position on wall with mod facilitation from OT for positioning and technique   Shoulder Exercises: Isometric Strengthening   Flexion Supine;5X10"   Extension Supine;5X10"   External Rotation Supine;5X10"   Internal Rotation Supine;5X10"   ABduction Supine;5X10"   ADduction Supine;5X10"   Manual Therapy   Manual Therapy Edema management;Myofascial release   Edema Management Edema technique to LUE starting at hand and ending at upper arm region.   Myofascial Release Myofascial release to left elbow, upper arm, trapezius, and scapularis region to decrease fascial restrictions and increase joint mobility in a pain free zone.                   OT Short Term Goals - 11/20/14 1542    OT SHORT TERM GOAL #1   Title Patient will be education and independent with HEP.   Status On-going   OT SHORT TERM GOAL #2   Title Patient will increase PROM to [facility] to increase ability to get dressed with less difficulty.   Status On-going   OT SHORT TERM GOAL #3   Title Patient  will increase strength to 3/5 to increase ability to complete daily tasks below shoulder height.    Status On-going   OT SHORT TERM GOAL #4   Title Patient will decrease fascial restrictions from max to min amount.   Status On-going   OT SHORT TERM GOAL #5   Title Patient will decrease pain during daily tasks to 4/10.   Status On-going  OT Long Term Goals - 11/20/14 1543    OT LONG TERM GOAL #1   Title Patient will return to highest level of independence with all daily tasks using LUE.   Status On-going   OT LONG TERM GOAL #2   Title Patient will increase AROM to WNL to increase ability to complete daily tasks above shoulder height with less difficulty.    Status On-going   OT LONG TERM GOAL #3   Title Patient will increase LUE strength to 4/5 to increase ability to hold grandchildren.   Status On-going   OT LONG TERM GOAL #4   Title Patient will decrease fascial restrictions to trace amount.   Status On-going   OT LONG TERM GOAL #5   Title Patient will decrease pain level to 2/10 or less when completing daily tasks.   Status On-going               Plan - 12/06/14 1224    Clinical Impression Statement A: Increased therapy ball to 20 repetitions, increased isometrics to 5X10". Pt reports increased pain during abduction therapy ball exercise this session.    Plan P: Add thumb tacks, AAROM exercises in supine if able to tolerate.         Problem List Patient Active Problem List   Diagnosis Date Noted  . Arthritis of knee, left 06/16/2013  . S/P total knee replacement using cement 06/14/2013  . Osteoarthritis of left knee 03/21/2013  . BPH (benign prostatic hyperplasia)   . GERD (gastroesophageal reflux disease)   . CAD (coronary artery disease)   . Fever 03/20/2013  . Altered mental state 03/20/2013  . Postoperative fever 03/20/2013  . PNA (pneumonia) 03/20/2013  . Preop cardiovascular exam 11/24/2012  . Peroneal tendonitis 10/05/2012  . Arthritis  of foot, degenerative 10/05/2012  . HTN (hypertension) 09/25/2012  . Hyperlipidemia 09/25/2012  . Atrial fibrillation 06/15/2012  . Long term (current) use of anticoagulants 06/15/2012  . Rotator cuff strain 02/04/2012  . Shoulder pain 02/04/2012  . Personal history of colonic polyps 01/29/2011  . High risk medication use 01/29/2011    Guadelupe Sabin, OTR/L  [phone]  12/06/2014, 12:28 PM  [place] [address] [place], [place], [address] Phone: [phone]   Fax:  [phone]

## 2014-12-08 ENCOUNTER — Ambulatory Visit (HOSPITAL_COMMUNITY): Payer: Medicare Other | Attending: Orthopaedic Surgery | Admitting: Specialist

## 2014-12-08 DIAGNOSIS — R29898 Other symptoms and signs involving the musculoskeletal system: Secondary | ICD-10-CM | POA: Insufficient documentation

## 2014-12-08 DIAGNOSIS — M25512 Pain in left shoulder: Secondary | ICD-10-CM | POA: Diagnosis not present

## 2014-12-08 DIAGNOSIS — M629 Disorder of muscle, unspecified: Secondary | ICD-10-CM | POA: Diagnosis not present

## 2014-12-08 DIAGNOSIS — M25612 Stiffness of left shoulder, not elsewhere classified: Secondary | ICD-10-CM | POA: Diagnosis not present

## 2014-12-08 DIAGNOSIS — M25522 Pain in left elbow: Secondary | ICD-10-CM | POA: Insufficient documentation

## 2014-12-08 NOTE — Therapy (Signed)
[place], [place], [address] Phone: [phone]   Fax:  [phone]  Occupational Therapy Treatment  Patient Details  Name: Jerry Cain MRN: [id number] Date of Birth: [birth year] Referring Provider:  Garald Balding, MD  Encounter Date: 12/08/2014      OT End of Session - 12/08/14 0944    Visit Number 9   Number of Visits 36   Date for OT Re-Evaluation 01/16/15  mini reassess on 12/15/14   Authorization Type Medicare part A   Authorization Time Period before 10th visit   Authorization - Visit Number 9   Authorization - Number of Visits 10   OT Start Time [phone]   OT Stop Time 0935   OT Time Calculation (min) 41 min   Activity Tolerance Patient tolerated treatment well   Behavior During Therapy [facility] for tasks assessed/performed      Past Medical History  Diagnosis Date  . Hypercholesterolemia     takes Atorvastatin daily  . Gout   . BPH (benign prostatic hyperplasia)     takes Proscar daily  . CAD (coronary artery disease) 2009    cardiac cath and PTCA by Dr. Tami Ribas  . Tubular adenoma 2001  . Diverticulosis 2007    tcs by Dr. Laural Golden  . GERD (gastroesophageal reflux disease)   . Adenomatous colon polyp 2001  . Osteoarthritis     left knee  . Complication of anesthesia     hard to wake up  . Hypertension     takes Imdur,Coreg,and Lisinopril daily  . Atrial fibrillation     takes Coumadin daily  . Pneumonia     many,many yrs ago  . Numbness     right hand pointer and middle finger  . Carpal tunnel syndrome     right  . Joint pain   . Joint swelling   . Nocturia   . Hypothyroidism     takes SYnthroid daily  . Anxiety     Past Surgical History  Procedure Laterality Date  . Hernia repair  2009    left inguinal  . Cataract extraction      bilateral  . Colonoscopy  2007    Dr. Laural Golden- L sided diverticulosis. Next TCS 2012 due to h/o tubular adenoma.  . Colonoscopy  02/26/2011   Procedure: COLONOSCOPY;  Surgeon: Daneil Dolin, MD;  [facility];  Service: Endoscopy;  Laterality: N/A;  11:05  . Tee without cardioversion  12/12/2011    Procedure: TRANSESOPHAGEAL ECHOCARDIOGRAM (TEE);  Surgeon: Sanda Klein, MD;  Location: [facility] ENDOSCOPY;  Service: Cardiovascular;  Laterality: N/A;   successful/sinus rhythm  . Cardioversion  12/12/2011    Procedure: CARDIOVERSION;  Surgeon: Sanda Klein, MD;  Location: [place];  Service: Cardiovascular;  Laterality: N/A;  . Bunionectomy Right 03/17/2013    Procedure: Lillard Anes, Arthroplasty 2nd toe right foot;  Surgeon: Marcheta Grammes, DPM;  Location: [facility];  Service: Orthopedics;  Laterality: Right;  . Aiken osteotomy Right 03/17/2013    Procedure: Barbie Banner OSTEOTOMY;  Surgeon: Marcheta Grammes, DPM;  Location: [facility];  Service: Orthopedics;  Laterality: Right;  . Flexor tenotomy Left 03/17/2013    Procedure: PERCUTANEOUS FLEXOR TENOTOMY 3RD TOE LEFT FOOT;  Surgeon: Marcheta Grammes, DPM;  Location: [facility];  Service: Orthopedics;  Laterality: Left;  . Cardiac catheterization  06/11/2007    PTCA X2 in 06/2007:  2 overlapping Cypher stents to LAD (2.5x13 and 3.0x23)  . Cardiac catheterization  06/21/2007    Cypher stent 2.5 x 13  to OM branch  . Coronary angioplasty      x 3   . Total knee arthroplasty Left 06/14/2013    Procedure: TOTAL KNEE ARTHROPLASTY;  Surgeon: Garald Balding, MD;  Location: [place];  Service: Orthopedics;  Laterality: Left;  . Rot Left     There were no vitals filed for this visit.  Visit Diagnosis:  Pain in joint, shoulder region, left  Shoulder stiffness, left      Subjective Assessment - 12/08/14 0855    Subjective  S:  I think the exercises were a little too much the other day.   Currently in Pain? No/denies            [facility] OT Assessment - 12/08/14 0001    Assessment   Diagnosis Left Shoulder RCR, SAD, DCR   Precautions   Precautions Shoulder   Type of  Shoulder Precautions PROM for 4 weeks (12/07/14) then progress to AAROM (01/04/15). Progress to AROM and progress as tolerated.   Shoulder Interventions Shoulder sling/immobilizer;Off for dressing/bathing/exercises                  OT Treatments/Exercises (OP) - 12/08/14 0001    Exercises   Exercises Shoulder   Shoulder Exercises: Supine   Protraction PROM;10 reps   Horizontal ABduction --   Horizontal ABduction Limitations hold   External Rotation PROM;10 reps   Internal Rotation PROM;10 reps   Flexion PROM;10 reps   ABduction PROM;10 reps   Other Supine Exercises serratus anterior punch with mod facilitation from OT 10 times   Shoulder Exercises: Seated   Elevation AROM;15 reps   Extension AROM;15 reps   Row AROM;15 reps   Other Seated Exercises closed chain flexion 15 times and scaption with min facilitiaton from OTR/L 15 circles clock wise and counter clock wise with min facilitation   Other Seated Exercises elbow flexion, extension, supination, pronation, wrist flexion extension 10 times    Shoulder Exercises: Therapy Ball   Flexion 20 reps   ABduction 20 reps   Shoulder Exercises: Isometric Strengthening   Flexion Supine;5X10"   Extension Supine;5X10"   External Rotation Supine;5X10"   Internal Rotation Supine;5X10"   ABduction Supine;5X10"   ADduction Supine;5X10"   Manual Therapy   Manual Therapy Myofascial release   Myofascial Release Myofascial release to left elbow, upper arm, trapezius, and scapularis region to decrease fascial restrictions and increase joint mobility in a pain free zone.                 OT Education - 12/08/14 0944    Education provided Yes   Education Details recommended heating elbow to decrease arthritic pain and limitations in elbow   Person(s) Educated Patient   Methods Explanation;Demonstration   Comprehension Verbalized understanding          OT Short Term Goals - 11/20/14 1542    OT SHORT TERM GOAL #1   Title  Patient will be education and independent with HEP.   Status On-going   OT SHORT TERM GOAL #2   Title Patient will increase PROM to [facility] to increase ability to get dressed with less difficulty.   Status On-going   OT SHORT TERM GOAL #3   Title Patient will increase strength to 3/5 to increase ability to complete daily tasks below shoulder height.    Status On-going   OT SHORT TERM GOAL #4   Title Patient will decrease fascial restrictions from max  to min amount.   Status On-going   OT SHORT TERM GOAL #5   Title Patient will decrease pain during daily tasks to 4/10.   Status On-going           OT Long Term Goals - 11/20/14 1543    OT LONG TERM GOAL #1   Title Patient will return to highest level of independence with all daily tasks using LUE.   Status On-going   OT LONG TERM GOAL #2   Title Patient will increase AROM to WNL to increase ability to complete daily tasks above shoulder height with less difficulty.    Status On-going   OT LONG TERM GOAL #3   Title Patient will increase LUE strength to 4/5 to increase ability to hold grandchildren.   Status On-going   OT LONG TERM GOAL #4   Title Patient will decrease fascial restrictions to trace amount.   Status On-going   OT LONG TERM GOAL #5   Title Patient will decrease pain level to 2/10 or less when completing daily tasks.   Status On-going               Plan - 12/08/14 0945    Clinical Impression Statement A:  Added closed chain exercises for improved shoulder PROM.  avoided horizontal abduction in supine as this irritates patients shoulder.   Plan P:  Attempt 50% range AAROM in supine, add thumbtacks, update g codes and complete MD progress note - patient has MD visit on 12/13/14        Problem List Patient Active Problem List   Diagnosis Date Noted  . Arthritis of knee, left 06/16/2013  . S/P total knee replacement using cement 06/14/2013  . Osteoarthritis of left knee 03/21/2013  . BPH (benign prostatic  hyperplasia)   . GERD (gastroesophageal reflux disease)   . CAD (coronary artery disease)   . Fever 03/20/2013  . Altered mental state 03/20/2013  . Postoperative fever 03/20/2013  . PNA (pneumonia) 03/20/2013  . Preop cardiovascular exam 11/24/2012  . Peroneal tendonitis 10/05/2012  . Arthritis of foot, degenerative 10/05/2012  . HTN (hypertension) 09/25/2012  . Hyperlipidemia 09/25/2012  . Atrial fibrillation 06/15/2012  . Long term (current) use of anticoagulants 06/15/2012  . Rotator cuff strain 02/04/2012  . Shoulder pain 02/04/2012  . Personal history of colonic polyps 01/29/2011  . High risk medication use 01/29/2011    Vangie Bicker, OTR/L [phone]  12/08/2014, 9:47 AM  [place] [address] [place], [place], [address] Phone: [phone]   Fax:  [phone]

## 2014-12-09 ENCOUNTER — Other Ambulatory Visit: Payer: Self-pay | Admitting: Cardiovascular Disease

## 2014-12-12 ENCOUNTER — Ambulatory Visit (HOSPITAL_COMMUNITY): Payer: Medicare Other

## 2014-12-12 ENCOUNTER — Encounter (HOSPITAL_COMMUNITY): Payer: Self-pay

## 2014-12-12 DIAGNOSIS — M6289 Other specified disorders of muscle: Secondary | ICD-10-CM

## 2014-12-12 DIAGNOSIS — M25612 Stiffness of left shoulder, not elsewhere classified: Secondary | ICD-10-CM

## 2014-12-12 DIAGNOSIS — M25522 Pain in left elbow: Secondary | ICD-10-CM | POA: Diagnosis not present

## 2014-12-12 DIAGNOSIS — R29898 Other symptoms and signs involving the musculoskeletal system: Secondary | ICD-10-CM

## 2014-12-12 DIAGNOSIS — M629 Disorder of muscle, unspecified: Secondary | ICD-10-CM

## 2014-12-12 DIAGNOSIS — M25512 Pain in left shoulder: Secondary | ICD-10-CM | POA: Diagnosis not present

## 2014-12-12 NOTE — Therapy (Signed)
[place], [place], [address] Phone: [phone]   Fax:  [phone]  Occupational Therapy Treatment and Reassessment  Patient Details  Name: Jerry Cain MRN: [id number] Date of Birth: 09-28-1931 Referring Provider:  Garald Balding, MD  Encounter Date: 12/12/2014      OT End of Session - 12/12/14 1043    Visit Number 10   Number of Visits 36   Date for OT Re-Evaluation 01/16/15  mini reassess on 12/15/14   Authorization Type Medicare part A   Authorization Time Period before 20th visit   Authorization - Visit Number 10   Authorization - Number of Visits 20   OT Start Time [phone]   OT Stop Time 0930   OT Time Calculation (min) 40 min   Activity Tolerance Patient tolerated treatment well   Behavior During Therapy [facility] for tasks assessed/performed      Past Medical History  Diagnosis Date  . Hypercholesterolemia     takes Atorvastatin daily  . Gout   . BPH (benign prostatic hyperplasia)     takes Proscar daily  . CAD (coronary artery disease) 2009    cardiac cath and PTCA by Dr. Tami Ribas  . Tubular adenoma 2001  . Diverticulosis 2007    tcs by Dr. Laural Golden  . GERD (gastroesophageal reflux disease)   . Adenomatous colon polyp 2001  . Osteoarthritis     left knee  . Complication of anesthesia     hard to wake up  . Hypertension     takes Imdur,Coreg,and Lisinopril daily  . Atrial fibrillation     takes Coumadin daily  . Pneumonia     many,many yrs ago  . Numbness     right hand pointer and middle finger  . Carpal tunnel syndrome     right  . Joint pain   . Joint swelling   . Nocturia   . Hypothyroidism     takes SYnthroid daily  . Anxiety     Past Surgical History  Procedure Laterality Date  . Hernia repair  2009    left inguinal  . Cataract extraction      bilateral  . Colonoscopy  2007    Dr. Laural Golden- L sided diverticulosis. Next TCS 2012 due to h/o tubular adenoma.  .  Colonoscopy  02/26/2011    Procedure: COLONOSCOPY;  Surgeon: Daneil Dolin, MD;  [facility];  Service: Endoscopy;  Laterality: N/A;  11:05  . Tee without cardioversion  12/12/2011    Procedure: TRANSESOPHAGEAL ECHOCARDIOGRAM (TEE);  Surgeon: Sanda Klein, MD;  Location: [facility] ENDOSCOPY;  Service: Cardiovascular;  Laterality: N/A;   successful/sinus rhythm  . Cardioversion  12/12/2011    Procedure: CARDIOVERSION;  Surgeon: Sanda Klein, MD;  Location: [place];  Service: Cardiovascular;  Laterality: N/A;  . Bunionectomy Right 03/17/2013    Procedure: Lillard Anes, Arthroplasty 2nd toe right foot;  Surgeon: Marcheta Grammes, DPM;  Location: [facility];  Service: Orthopedics;  Laterality: Right;  . Aiken osteotomy Right 03/17/2013    Procedure: Barbie Banner OSTEOTOMY;  Surgeon: Marcheta Grammes, DPM;  [facility];  Service: Orthopedics;  Laterality: Right;  . Flexor tenotomy Left 03/17/2013    Procedure: PERCUTANEOUS FLEXOR TENOTOMY 3RD TOE LEFT FOOT;  Surgeon: Marcheta Grammes, DPM;  Location: [facility];  Service: Orthopedics;  Laterality: Left;  . Cardiac catheterization  06/11/2007    PTCA X2 in 06/2007:  2 overlapping Cypher stents to LAD (2.5x13 and 3.0x23)  .  Cardiac catheterization  06/21/2007    Cypher stent 2.5 x 13  to OM branch  . Coronary angioplasty      x 3   . Total knee arthroplasty Left 06/14/2013    Procedure: TOTAL KNEE ARTHROPLASTY;  Surgeon: Garald Balding, MD;  Location: [place];  Service: Orthopedics;  Laterality: Left;  . Rot Left     There were no vitals filed for this visit.  Visit Diagnosis:  Shoulder stiffness, left  Shoulder weakness  Tight fascia      Subjective Assessment - 12/12/14 0850    Subjective  S: I feel like the shoulder could do better if my elbow wasn't hurting.    Special Tests FOTO score: 43/100   Currently in Pain? No/denies            [facility] OT Assessment - 12/12/14 0850    Assessment   Diagnosis Left  Shoulder RCR, SAD, DCR   Precautions   Precautions Shoulder   Type of Shoulder Precautions PROM for 4 weeks (12/07/14) then progress to AAROM (01/04/15). Progress to AROM and progress as tolerated.   Shoulder Interventions Shoulder sling/immobilizer;Off for dressing/bathing/exercises   AROM   Overall AROM  Unable to assess;Due to precautions   PROM   Overall PROM Comments assessed in supine ER/IR in adducted position    PROM Assessment Site Shoulder   Right/Left Shoulder Left   Left Shoulder Flexion 95 Degrees  previous: 90   Left Shoulder ABduction 90 Degrees  previous: 75   Left Shoulder Internal Rotation 90 Degrees  previous: same   Left Shoulder External Rotation 73 Degrees  previous: 40   Right/Left Elbow Left   Left Elbow Flexion 120  previous: 118   Left Elbow Extension -16  previous: -28   Right/Left Forearm Left   Left Forearm Pronation 90 Degrees  previous: 90   Left Forearm Supination 67 Degrees  previous: 67   Strength   Overall Strength Unable to assess;Due to precautions                  OT Treatments/Exercises (OP) - 12/12/14 0902    Exercises   Exercises Shoulder   Shoulder Exercises: Supine   Protraction PROM;10 reps   Horizontal ABduction Limitations hold   External Rotation PROM;10 reps   Internal Rotation PROM;10 reps   Flexion PROM;10 reps   ABduction PROM;10 reps   Shoulder Exercises: Seated   Elevation AROM;15 reps   Extension AROM;15 reps   Row AROM;15 reps   Shoulder Exercises: Therapy Ball   Flexion 20 reps   ABduction 20 reps   Shoulder Exercises: Isometric Strengthening   Flexion Supine;5X10"   Extension Supine;5X10"   External Rotation Supine;5X10"   Internal Rotation Supine;5X10"   ABduction Supine;5X10"   ADduction Supine;5X10"   Manual Therapy   Manual Therapy Myofascial release   Myofascial Release Myofascial release to left elbow, upper arm, trapezius, and scapularis region to decrease fascial restrictions and  increase joint mobility in a pain free zone.                   OT Short Term Goals - 12/12/14 1044    OT SHORT TERM GOAL #1   Title Patient will be education and independent with HEP.   Status On-going   OT SHORT TERM GOAL #2   Title Patient will increase PROM to [facility] to increase ability to get dressed with less difficulty.   Status On-going   OT SHORT TERM GOAL #3  Title Patient will increase strength to 3/5 to increase ability to complete daily tasks below shoulder height.    Status On-going   OT SHORT TERM GOAL #4   Title Patient will decrease fascial restrictions from max to min amount.   Status Achieved   OT SHORT TERM GOAL #5   Title Patient will decrease pain during daily tasks to 4/10.   Status On-going           OT Long Term Goals - 11/20/14 1543    OT LONG TERM GOAL #1   Title Patient will return to highest level of independence with all daily tasks using LUE.   Status On-going   OT LONG TERM GOAL #2   Title Patient will increase AROM to WNL to increase ability to complete daily tasks above shoulder height with less difficulty.    Status On-going   OT LONG TERM GOAL #3   Title Patient will increase LUE strength to 4/5 to increase ability to hold grandchildren.   Status On-going   OT LONG TERM GOAL #4   Title Patient will decrease fascial restrictions to trace amount.   Status On-going   OT LONG TERM GOAL #5   Title Patient will decrease pain level to 2/10 or less when completing daily tasks.   Status On-going               Plan - 01-01-15 1044    Clinical Impression Statement A: Mini reassessment completed this date. Patient has met 1/5 STGs and is making slow progress towards goals. Patient states that he is now able to complete all dressing tasks himself with the exception of buttoning the top button on his shirt. Pt continues to have increased pain in left elbow which is increased during passive stretching.    Plan P: Attempt 50% range  AAROM in supine. Add thumb tacks. Focus manual therapy on left clavicle, pectoralis and infraspinatus region as muscle tightness was noted in this area.           G-Codes - 01-01-15 1057    Functional Assessment Tool Used FOTO score: 43/100 (57% imapired)   Functional Limitation Carrying, moving and handling objects   Carrying, Moving and Handling Objects Current Status (W0981) At least 40 percent but less than 60 percent impaired, limited or restricted   Carrying, Moving and Handling Objects Goal Status (X9147) At least 20 percent but less than 40 percent impaired, limited or restricted      Problem List Patient Active Problem List   Diagnosis Date Noted  . Arthritis of knee, left 06/16/2013  . S/P total knee replacement using cement 06/14/2013  . Osteoarthritis of left knee 03/21/2013  . BPH (benign prostatic hyperplasia)   . GERD (gastroesophageal reflux disease)   . CAD (coronary artery disease)   . Fever 03/20/2013  . Altered mental state 03/20/2013  . Postoperative fever 03/20/2013  . PNA (pneumonia) 03/20/2013  . Preop cardiovascular exam 11/24/2012  . Peroneal tendonitis 10/05/2012  . Arthritis of foot, degenerative 10/05/2012  . HTN (hypertension) 09/25/2012  . Hyperlipidemia 09/25/2012  . Atrial fibrillation 06/15/2012  . Long term (current) use of anticoagulants 06/15/2012  . Rotator cuff strain 02/04/2012  . Shoulder pain 02/04/2012  . Personal history of colonic polyps 01/29/2011  . High risk medication use 01/29/2011   Occupational Therapy Progress Note  Dates of Reporting Period: 11/17/14 to Jan 01, 2015  Objective Reports of Subjective Statement: See objective statement above  Objective Measurements: See measurements above  Goal Update:  Pt has met 1/5 STGs. Although progress is slow, pt is progressing towards therapy.  Plan: Attempt 50% range AAROM in supine. Add thumb tacks. Focus manual therapy on left clavicle, pectoralis and infraspinatus region as muscle  tightness was noted in this area.   Reason Skilled Services are Required: Pt requires skilled OT services to increase functional performance using LUE during daily tasks.    Ailene Ravel, OTR/L,CBIS  [phone]  12/12/2014, 10:58 AM  [place] [address] [place], [place], [address] Phone: [phone]   Fax:  [phone]

## 2014-12-13 ENCOUNTER — Ambulatory Visit (INDEPENDENT_AMBULATORY_CARE_PROVIDER_SITE_OTHER): Payer: Medicare Other | Admitting: *Deleted

## 2014-12-13 ENCOUNTER — Encounter (HOSPITAL_COMMUNITY): Payer: Self-pay | Admitting: Occupational Therapy

## 2014-12-13 ENCOUNTER — Ambulatory Visit (HOSPITAL_COMMUNITY): Payer: Medicare Other | Admitting: Occupational Therapy

## 2014-12-13 DIAGNOSIS — M629 Disorder of muscle, unspecified: Secondary | ICD-10-CM

## 2014-12-13 DIAGNOSIS — M25512 Pain in left shoulder: Secondary | ICD-10-CM | POA: Diagnosis not present

## 2014-12-13 DIAGNOSIS — M25522 Pain in left elbow: Secondary | ICD-10-CM | POA: Diagnosis not present

## 2014-12-13 DIAGNOSIS — R29898 Other symptoms and signs involving the musculoskeletal system: Secondary | ICD-10-CM

## 2014-12-13 DIAGNOSIS — M25612 Stiffness of left shoulder, not elsewhere classified: Secondary | ICD-10-CM | POA: Diagnosis not present

## 2014-12-13 DIAGNOSIS — I4891 Unspecified atrial fibrillation: Secondary | ICD-10-CM

## 2014-12-13 DIAGNOSIS — Z7901 Long term (current) use of anticoagulants: Secondary | ICD-10-CM | POA: Diagnosis not present

## 2014-12-13 DIAGNOSIS — M6289 Other specified disorders of muscle: Secondary | ICD-10-CM

## 2014-12-13 LAB — POCT INR: INR: 2.9

## 2014-12-13 NOTE — Therapy (Signed)
[place], [place], [address] Phone: [phone]   Fax:  [phone]  Occupational Therapy Treatment  Patient Details  Name: Jerry Cain MRN: [id number] Date of Birth: [birth year] Referring Provider:  Garald Balding, MD  Encounter Date: 12/13/2014      OT End of Session - 12/13/14 1304    Visit Number 11   Number of Visits 36   Date for OT Re-Evaluation 01/16/15   Authorization Type Medicare part A   Authorization Time Period before 20th visit   Authorization - Visit Number 11   Authorization - Number of Visits 20   OT Start Time 1152   OT Stop Time 1236   OT Time Calculation (min) 44 min   Activity Tolerance Patient tolerated treatment well   Behavior During Therapy [facility] for tasks assessed/performed      Past Medical History  Diagnosis Date  . Hypercholesterolemia     takes Atorvastatin daily  . Gout   . BPH (benign prostatic hyperplasia)     takes Proscar daily  . CAD (coronary artery disease) 2009    cardiac cath and PTCA by Dr. Tami Ribas  . Tubular adenoma 2001  . Diverticulosis 2007    tcs by Dr. Laural Golden  . GERD (gastroesophageal reflux disease)   . Adenomatous colon polyp 2001  . Osteoarthritis     left knee  . Complication of anesthesia     hard to wake up  . Hypertension     takes Imdur,Coreg,and Lisinopril daily  . Atrial fibrillation     takes Coumadin daily  . Pneumonia     many,many yrs ago  . Numbness     right hand pointer and middle finger  . Carpal tunnel syndrome     right  . Joint pain   . Joint swelling   . Nocturia   . Hypothyroidism     takes SYnthroid daily  . Anxiety     Past Surgical History  Procedure Laterality Date  . Hernia repair  2009    left inguinal  . Cataract extraction      bilateral  . Colonoscopy  2007    Dr. Laural Golden- L sided diverticulosis. Next TCS 2012 due to h/o tubular adenoma.  . Colonoscopy  02/26/2011    Procedure: COLONOSCOPY;   Surgeon: Daneil Dolin, MD;  [facility];  Service: Endoscopy;  Laterality: N/A;  11:05  . Tee without cardioversion  12/12/2011    Procedure: TRANSESOPHAGEAL ECHOCARDIOGRAM (TEE);  Surgeon: Sanda Klein, MD;  Location: [facility] ENDOSCOPY;  Service: Cardiovascular;  Laterality: N/A;   successful/sinus rhythm  . Cardioversion  12/12/2011    Procedure: CARDIOVERSION;  Surgeon: Sanda Klein, MD;  Location: [place];  Service: Cardiovascular;  Laterality: N/A;  . Bunionectomy Right 03/17/2013    Procedure: Lillard Anes, Arthroplasty 2nd toe right foot;  Surgeon: Marcheta Grammes, DPM;  [facility];  Service: Orthopedics;  Laterality: Right;  . Aiken osteotomy Right 03/17/2013    Procedure: Barbie Banner OSTEOTOMY;  Surgeon: Marcheta Grammes, DPM;  [facility];  Service: Orthopedics;  Laterality: Right;  . Flexor tenotomy Left 03/17/2013    Procedure: PERCUTANEOUS FLEXOR TENOTOMY 3RD TOE LEFT FOOT;  Surgeon: Marcheta Grammes, DPM;  [facility];  Service: Orthopedics;  Laterality: Left;  . Cardiac catheterization  06/11/2007    PTCA X2 in 06/2007:  2 overlapping Cypher stents to LAD (2.5x13 and 3.0x23)  . Cardiac catheterization  06/21/2007  Cypher stent 2.5 x 13  to OM branch  . Coronary angioplasty      x 3   . Total knee arthroplasty Left 06/14/2013    Procedure: TOTAL KNEE ARTHROPLASTY;  Surgeon: Garald Balding, MD;  Location: [place];  Service: Orthopedics;  Laterality: Left;  . Rot Left     There were no vitals filed for this visit.  Visit Diagnosis:  Shoulder stiffness, left  Shoulder weakness  Tight fascia  Pain in joint, shoulder region, left      Subjective Assessment - 12/13/14 1155    Subjective  S: The doctor just jerked that arm right up this morning.    Currently in Pain? Yes   Pain Score 2    Pain Location Shoulder   Pain Orientation Left   Pain Descriptors / Indicators Aching   Pain Type Acute pain            [facility] OT  Assessment - 12/13/14 1218    Assessment   Diagnosis Left Shoulder RCR, SAD, DCR   Precautions   Precautions Shoulder   Type of Shoulder Precautions PROM for 4 weeks (until 12/07/14) then progress to AAROM (until 01/04/15). Progress to AROM and progress as tolerated.   Shoulder Interventions Shoulder sling/immobilizer;Off for dressing/bathing/exercises                  OT Treatments/Exercises (OP) - 12/13/14 1217    Exercises   Exercises Shoulder   Shoulder Exercises: Supine   Protraction PROM;10 reps;AAROM;5 reps   Protraction Limitations OT providing support at elbow during decent   Horizontal ABduction PROM;10 reps;AAROM;5 reps   Horizontal ABduction Limitations OT providing min assist at elbow   External Rotation PROM;10 reps;AAROM;5 reps   Internal Rotation PROM;10 reps;AAROM;5 reps   Flexion PROM;10 reps;AAROM;5 reps   Flexion Limitations 50% range   ABduction PROM;10 reps;AAROM;5 reps   ABduction Limitations OT unweighted arm; <50% range   Shoulder Exercises: Seated   Elevation AROM;20 reps   Extension AROM;20 reps   Row AROM;20 reps   Shoulder Exercises: ROM/Strengthening   Thumb Tacks 1'   Manual Therapy   Manual Therapy Myofascial release   Myofascial Release Myofascial release to left elbow, upper arm, trapezius, and scapularis region to decrease fascial restrictions and increase joint mobility in a pain free zone.                   OT Short Term Goals - 12/12/14 1044    OT SHORT TERM GOAL #1   Title Patient will be education and independent with HEP.   Status On-going   OT SHORT TERM GOAL #2   Title Patient will increase PROM to [facility] to increase ability to get dressed with less difficulty.   Status On-going   OT SHORT TERM GOAL #3   Title Patient will increase strength to 3/5 to increase ability to complete daily tasks below shoulder height.    Status On-going   OT SHORT TERM GOAL #4   Title Patient will decrease fascial restrictions from max  to min amount.   Status Achieved   OT SHORT TERM GOAL #5   Title Patient will decrease pain during daily tasks to 4/10.   Status On-going           OT Long Term Goals - 11/20/14 1543    OT LONG TERM GOAL #1   Title Patient will return to highest level of independence with all daily tasks using LUE.   Status  On-going   OT LONG TERM GOAL #2   Title Patient will increase AROM to WNL to increase ability to complete daily tasks above shoulder height with less difficulty.    Status On-going   OT LONG TERM GOAL #3   Title Patient will increase LUE strength to 4/5 to increase ability to hold grandchildren.   Status On-going   OT LONG TERM GOAL #4   Title Patient will decrease fascial restrictions to trace amount.   Status On-going   OT LONG TERM GOAL #5   Title Patient will decrease pain level to 2/10 or less when completing daily tasks.   Status On-going               Plan - 12/13/14 1305    Clinical Impression Statement A: Added AAROM in supine, up to 50% range, added thumb tacks. Pt is pain limited in elbow during PROM and AAROM exercises, OT provided support to elbow during AAROM to decrease pain.    Plan P: Attempt to increase independence in AAROM exercises, if able to tolerate without elbow support. Resume prot/ret/elev/dep exercise.           G-Codes - 01/10/15 1057    Functional Assessment Tool Used FOTO score: 43/100 (57% imapired)   Functional Limitation Carrying, moving and handling objects   Carrying, Moving and Handling Objects Current Status (Q9643) At least 40 percent but less than 60 percent impaired, limited or restricted   Carrying, Moving and Handling Objects Goal Status (C3818) At least 20 percent but less than 40 percent impaired, limited or restricted      Problem List Patient Active Problem List   Diagnosis Date Noted  . Arthritis of knee, left 06/16/2013  . S/P total knee replacement using cement 06/14/2013  . Osteoarthritis of left knee  03/21/2013  . BPH (benign prostatic hyperplasia)   . GERD (gastroesophageal reflux disease)   . CAD (coronary artery disease)   . Fever 03/20/2013  . Altered mental state 03/20/2013  . Postoperative fever 03/20/2013  . PNA (pneumonia) 03/20/2013  . Preop cardiovascular exam 11/24/2012  . Peroneal tendonitis 10/05/2012  . Arthritis of foot, degenerative 10/05/2012  . HTN (hypertension) 09/25/2012  . Hyperlipidemia 09/25/2012  . Atrial fibrillation 06/15/2012  . Long term (current) use of anticoagulants 06/15/2012  . Rotator cuff strain 02/04/2012  . Shoulder pain 02/04/2012  . Personal history of colonic polyps 01/29/2011  . High risk medication use 01/29/2011    Guadelupe Sabin, OTR/L  [phone]  12/13/2014, 1:09 PM  [place] [address] [place], [place], [address] Phone: [phone]   Fax:  [phone]

## 2014-12-15 ENCOUNTER — Encounter (HOSPITAL_COMMUNITY): Payer: Self-pay

## 2014-12-15 ENCOUNTER — Ambulatory Visit (HOSPITAL_COMMUNITY): Payer: Medicare Other

## 2014-12-15 DIAGNOSIS — M629 Disorder of muscle, unspecified: Secondary | ICD-10-CM | POA: Diagnosis not present

## 2014-12-15 DIAGNOSIS — R29898 Other symptoms and signs involving the musculoskeletal system: Secondary | ICD-10-CM | POA: Diagnosis not present

## 2014-12-15 DIAGNOSIS — M25522 Pain in left elbow: Secondary | ICD-10-CM | POA: Diagnosis not present

## 2014-12-15 DIAGNOSIS — M25512 Pain in left shoulder: Secondary | ICD-10-CM

## 2014-12-15 DIAGNOSIS — M6289 Other specified disorders of muscle: Secondary | ICD-10-CM

## 2014-12-15 DIAGNOSIS — M25612 Stiffness of left shoulder, not elsewhere classified: Secondary | ICD-10-CM

## 2014-12-15 NOTE — Therapy (Signed)
[place], [place], [address] Phone: [phone]   Fax:  [phone]  Occupational Therapy Treatment  Patient Details  Name: Jerry Cain MRN: [id number] Date of Birth: [birth year] Referring Provider:  Garald Balding, MD  Encounter Date: 12/15/2014      OT End of Session - 12/15/14 1211    Visit Number 12   Number of Visits 36   Date for OT Re-Evaluation 01/16/15   Authorization Type Medicare part A   Authorization Time Period before 20th visit   Authorization - Visit Number 12   Authorization - Number of Visits 20   OT Start Time [phone]   OT Stop Time 0930   OT Time Calculation (min) 40 min   Activity Tolerance Patient tolerated treatment well   Behavior During Therapy [facility] for tasks assessed/performed      Past Medical History  Diagnosis Date  . Hypercholesterolemia     takes Atorvastatin daily  . Gout   . BPH (benign prostatic hyperplasia)     takes Proscar daily  . CAD (coronary artery disease) 2009    cardiac cath and PTCA by Dr. Tami Ribas  . Tubular adenoma 2001  . Diverticulosis 2007    tcs by Dr. Laural Golden  . GERD (gastroesophageal reflux disease)   . Adenomatous colon polyp 2001  . Osteoarthritis     left knee  . Complication of anesthesia     hard to wake up  . Hypertension     takes Imdur,Coreg,and Lisinopril daily  . Atrial fibrillation     takes Coumadin daily  . Pneumonia     many,many yrs ago  . Numbness     right hand pointer and middle finger  . Carpal tunnel syndrome     right  . Joint pain   . Joint swelling   . Nocturia   . Hypothyroidism     takes SYnthroid daily  . Anxiety     Past Surgical History  Procedure Laterality Date  . Hernia repair  2009    left inguinal  . Cataract extraction      bilateral  . Colonoscopy  2007    Dr. Laural Golden- L sided diverticulosis. Next TCS 2012 due to h/o tubular adenoma.  . Colonoscopy  02/26/2011    Procedure: COLONOSCOPY;   Surgeon: Daneil Dolin, MD;  [facility];  Service: Endoscopy;  Laterality: N/A;  11:05  . Tee without cardioversion  12/12/2011    Procedure: TRANSESOPHAGEAL ECHOCARDIOGRAM (TEE);  Surgeon: Sanda Klein, MD;  Location: [facility] ENDOSCOPY;  Service: Cardiovascular;  Laterality: N/A;   successful/sinus rhythm  . Cardioversion  12/12/2011    Procedure: CARDIOVERSION;  Surgeon: Sanda Klein, MD;  Location: [place];  Service: Cardiovascular;  Laterality: N/A;  . Bunionectomy Right 03/17/2013    Procedure: Lillard Anes, Arthroplasty 2nd toe right foot;  Surgeon: Marcheta Grammes, DPM;  [facility];  Service: Orthopedics;  Laterality: Right;  . Aiken osteotomy Right 03/17/2013    Procedure: Barbie Banner OSTEOTOMY;  Surgeon: Marcheta Grammes, DPM;  [facility];  Service: Orthopedics;  Laterality: Right;  . Flexor tenotomy Left 03/17/2013    Procedure: PERCUTANEOUS FLEXOR TENOTOMY 3RD TOE LEFT FOOT;  Surgeon: Marcheta Grammes, DPM;  [facility];  Service: Orthopedics;  Laterality: Left;  . Cardiac catheterization  06/11/2007    PTCA X2 in 06/2007:  2 overlapping Cypher stents to LAD (2.5x13 and 3.0x23)  . Cardiac catheterization  06/21/2007  Cypher stent 2.5 x 13  to OM branch  . Coronary angioplasty      x 3   . Total knee arthroplasty Left 06/14/2013    Procedure: TOTAL KNEE ARTHROPLASTY;  Surgeon: Garald Balding, MD;  Location: [place];  Service: Orthopedics;  Laterality: Left;  . Rot Left     There were no vitals filed for this visit.  Visit Diagnosis:  Shoulder stiffness, left  Shoulder weakness  Tight fascia  Pain in joint, shoulder region, left      Subjective Assessment - 12/15/14 0912    Subjective  S: I would be ok if my elbow didn't hurt all the time.    Currently in Pain? Yes   Pain Score 3    Pain Location Shoulder   Pain Orientation Left   Pain Descriptors / Indicators Sore   Pain Type Acute pain            [facility] OT  Assessment - 12/15/14 0915    Assessment   Diagnosis Left Shoulder RCR, SAD, DCR   Precautions   Precautions Shoulder   Type of Shoulder Precautions PROM for 4 weeks (until 12/07/14) then progress to AAROM (until 01/04/15). Progress to AROM and progress as tolerated.                  OT Treatments/Exercises (OP) - 12/15/14 0915    Exercises   Exercises Shoulder   Shoulder Exercises: Supine   Protraction PROM;10 reps;AAROM;5 reps;Limitations   Protraction Limitations Able to complete 50% range.   Horizontal ABduction PROM;10 reps;AAROM;5 reps   External Rotation PROM;AAROM;10 reps   Internal Rotation PROM;AAROM;10 reps   Flexion PROM;10 reps;AAROM;5 reps;Limitations   Flexion Limitations Completed with elbow flexed due to pain. 50% range.   Shoulder Exercises: Seated   Row AROM;10 reps   Manual Therapy   Manual Therapy Myofascial release   Myofascial Release Myofascial release to left upper arm, trapezius, and scapularis region to decrease fascial restrictions and increase joint mobility in a pain free zone.                   OT Short Term Goals - 12/12/14 1044    OT SHORT TERM GOAL #1   Title Patient will be education and independent with HEP.   Status On-going   OT SHORT TERM GOAL #2   Title Patient will increase PROM to [facility] to increase ability to get dressed with less difficulty.   Status On-going   OT SHORT TERM GOAL #3   Title Patient will increase strength to 3/5 to increase ability to complete daily tasks below shoulder height.    Status On-going   OT SHORT TERM GOAL #4   Title Patient will decrease fascial restrictions from max to min amount.   Status Achieved   OT SHORT TERM GOAL #5   Title Patient will decrease pain during daily tasks to 4/10.   Status On-going           OT Long Term Goals - 11/20/14 1543    OT LONG TERM GOAL #1   Title Patient will return to highest level of independence with all daily tasks using LUE.   Status On-going    OT LONG TERM GOAL #2   Title Patient will increase AROM to WNL to increase ability to complete daily tasks above shoulder height with less difficulty.    Status On-going   OT LONG TERM GOAL #3   Title Patient will increase LUE strength  to 4/5 to increase ability to hold grandchildren.   Status On-going   OT LONG TERM GOAL #4   Title Patient will decrease fascial restrictions to trace amount.   Status On-going   OT LONG TERM GOAL #5   Title Patient will decrease pain level to 2/10 or less when completing daily tasks.   Status On-going               Plan - 12/15/14 1211    Clinical Impression Statement A: Patient was able to complete 5 repetitions of supine AA/ROM without therapist assisting. patient was unable to achieve full range although was able to complete exercises without assistance.    Plan P: Add AA/ROM seated if able to tolerate. Attempt to increase all AA/ROM supine exercises repetitions.        Problem List Patient Active Problem List   Diagnosis Date Noted  . Arthritis of knee, left 06/16/2013  . S/P total knee replacement using cement 06/14/2013  . Osteoarthritis of left knee 03/21/2013  . BPH (benign prostatic hyperplasia)   . GERD (gastroesophageal reflux disease)   . CAD (coronary artery disease)   . Fever 03/20/2013  . Altered mental state 03/20/2013  . Postoperative fever 03/20/2013  . PNA (pneumonia) 03/20/2013  . Preop cardiovascular exam 11/24/2012  . Peroneal tendonitis 10/05/2012  . Arthritis of foot, degenerative 10/05/2012  . HTN (hypertension) 09/25/2012  . Hyperlipidemia 09/25/2012  . Atrial fibrillation 06/15/2012  . Long term (current) use of anticoagulants 06/15/2012  . Rotator cuff strain 02/04/2012  . Shoulder pain 02/04/2012  . Personal history of colonic polyps 01/29/2011  . High risk medication use 01/29/2011    Ailene Ravel, OTR/L,CBIS  [phone]  12/15/2014, 12:19 PM  [place] [address] [place], [place], [address] Phone: [phone]   Fax:  [phone]

## 2014-12-20 ENCOUNTER — Ambulatory Visit (HOSPITAL_COMMUNITY): Payer: Medicare Other | Admitting: Specialist

## 2014-12-20 DIAGNOSIS — Z9889 Other specified postprocedural states: Secondary | ICD-10-CM | POA: Diagnosis not present

## 2014-12-20 DIAGNOSIS — M25612 Stiffness of left shoulder, not elsewhere classified: Secondary | ICD-10-CM

## 2014-12-20 DIAGNOSIS — M25512 Pain in left shoulder: Secondary | ICD-10-CM | POA: Diagnosis not present

## 2014-12-20 DIAGNOSIS — M7502 Adhesive capsulitis of left shoulder: Secondary | ICD-10-CM | POA: Diagnosis not present

## 2014-12-20 DIAGNOSIS — R238 Other skin changes: Secondary | ICD-10-CM | POA: Diagnosis not present

## 2014-12-20 DIAGNOSIS — R29898 Other symptoms and signs involving the musculoskeletal system: Secondary | ICD-10-CM

## 2014-12-20 DIAGNOSIS — M7542 Impingement syndrome of left shoulder: Secondary | ICD-10-CM | POA: Diagnosis not present

## 2014-12-20 DIAGNOSIS — M629 Disorder of muscle, unspecified: Secondary | ICD-10-CM | POA: Diagnosis not present

## 2014-12-20 DIAGNOSIS — M75122 Complete rotator cuff tear or rupture of left shoulder, not specified as traumatic: Secondary | ICD-10-CM | POA: Diagnosis not present

## 2014-12-20 DIAGNOSIS — M25522 Pain in left elbow: Secondary | ICD-10-CM | POA: Diagnosis not present

## 2014-12-20 NOTE — Therapy (Signed)
[place], [place], [address] Phone: [phone]   Fax:  [phone]  Occupational Therapy Treatment  Patient Details  Name: Jerry Cain MRN: [id number] Date of Birth: 11/02/1931 Referring Provider:  Garald Balding, MD  Encounter Date: 12/20/2014      OT End of Session - 12/20/14 1014    Visit Number 13   Number of Visits 36   Date for OT Re-Evaluation 01/16/15   Authorization Type Medicare part A   Authorization Time Period before 20th visit   Authorization - Visit Number 22   Authorization - Number of Visits 20   OT Start Time [phone]   OT Stop Time 1011   OT Time Calculation (min) 33 min   Activity Tolerance Treatment limited secondary to medical complications (Comment)   Behavior During Therapy [facility] for tasks assessed/performed      Past Medical History  Diagnosis Date  . Hypercholesterolemia     takes Atorvastatin daily  . Gout   . BPH (benign prostatic hyperplasia)     takes Proscar daily  . CAD (coronary artery disease) 2009    cardiac cath and PTCA by Dr. Tami Ribas  . Tubular adenoma 2001  . Diverticulosis 2007    tcs by Dr. Laural Golden  . GERD (gastroesophageal reflux disease)   . Adenomatous colon polyp 2001  . Osteoarthritis     left knee  . Complication of anesthesia     hard to wake up  . Hypertension     takes Imdur,Coreg,and Lisinopril daily  . Atrial fibrillation     takes Coumadin daily  . Pneumonia     many,many yrs ago  . Numbness     right hand pointer and middle finger  . Carpal tunnel syndrome     right  . Joint pain   . Joint swelling   . Nocturia   . Hypothyroidism     takes SYnthroid daily  . Anxiety     Past Surgical History  Procedure Laterality Date  . Hernia repair  2009    left inguinal  . Cataract extraction      bilateral  . Colonoscopy  2007    Dr. Laural Golden- L sided diverticulosis. Next TCS 2012 due to h/o tubular adenoma.  . Colonoscopy   02/26/2011    Procedure: COLONOSCOPY;  Surgeon: Daneil Dolin, MD;  [facility];  Service: Endoscopy;  Laterality: N/A;  11:05  . Tee without cardioversion  12/12/2011    Procedure: TRANSESOPHAGEAL ECHOCARDIOGRAM (TEE);  Surgeon: Sanda Klein, MD;  Location: [facility] ENDOSCOPY;  Service: Cardiovascular;  Laterality: N/A;   successful/sinus rhythm  . Cardioversion  12/12/2011    Procedure: CARDIOVERSION;  Surgeon: Sanda Klein, MD;  Location: [place];  Service: Cardiovascular;  Laterality: N/A;  . Bunionectomy Right 03/17/2013    Procedure: Lillard Anes, Arthroplasty 2nd toe right foot;  Surgeon: Marcheta Grammes, DPM;  [facility];  Service: Orthopedics;  Laterality: Right;  . Aiken osteotomy Right 03/17/2013    Procedure: Barbie Banner OSTEOTOMY;  Surgeon: Marcheta Grammes, DPM;  [facility];  Service: Orthopedics;  Laterality: Right;  . Flexor tenotomy Left 03/17/2013    Procedure: PERCUTANEOUS FLEXOR TENOTOMY 3RD TOE LEFT FOOT;  Surgeon: Marcheta Grammes, DPM;  [facility];  Service: Orthopedics;  Laterality: Left;  . Cardiac catheterization  06/11/2007    PTCA X2 in 06/2007:  2 overlapping Cypher stents to LAD (2.5x13 and 3.0x23)  . Cardiac catheterization  06/21/2007    Cypher stent 2.5 x 13  to OM branch  . Coronary angioplasty      x 3   . Total knee arthroplasty Left 06/14/2013    Procedure: TOTAL KNEE ARTHROPLASTY;  Surgeon: Garald Balding, MD;  Location: [place];  Service: Orthopedics;  Laterality: Left;  . Rot Left     There were no vitals filed for this visit.  Visit Diagnosis:  Shoulder stiffness, left  Shoulder weakness  Pain in joint, shoulder region, left      Subjective Assessment - 12/20/14 1011    Subjective  S:  My shoulder just started bleeding for no reason.  I went to Dr. Durward Fortes this morning so that he could check it out.  He's not sure why it is doing this.  He drew some blood to check for an infection.              [facility] OT Assessment - 12/20/14 0001    Assessment   Diagnosis Left Shoulder RCR, SAD, DCR   Precautions   Precautions Shoulder   Type of Shoulder Precautions PROM for 4 weeks (until 12/07/14) then progress to AAROM (until 01/04/15). Progress to AROM and progress as tolerated.                  OT Treatments/Exercises (OP) - 12/20/14 0001    Exercises   Exercises Shoulder   Shoulder Exercises: Supine   Protraction PROM;10 reps   External Rotation PROM;10 reps   Internal Rotation PROM;10 reps   Flexion PROM;10 reps   ABduction PROM;10 reps   Other Supine Exercises elbow flexion and extension PROM X 10   Shoulder Exercises: Isometric Strengthening   Flexion Supine;5X10"   Extension Supine;5X10"   External Rotation Supine;5X10"   Internal Rotation Supine;5X10"   ABduction Supine;5X10"   ADduction DDUKGU;5K27"   Manual Therapy   Manual Therapy Myofascial release   Myofascial Release Myofascial release to left upper arm, trapezius, and scapularis region to decrease fascial restrictions and increase joint mobility in a pain free zone.  MFR to left elbow region to decrease elbow joint stiffness and pain.                   OT Short Term Goals - 12/12/14 1044    OT SHORT TERM GOAL #1   Title Patient will be education and independent with HEP.   Status On-going   OT SHORT TERM GOAL #2   Title Patient will increase PROM to [facility] to increase ability to get dressed with less difficulty.   Status On-going   OT SHORT TERM GOAL #3   Title Patient will increase strength to 3/5 to increase ability to complete daily tasks below shoulder height.    Status On-going   OT SHORT TERM GOAL #4   Title Patient will decrease fascial restrictions from max to min amount.   Status Achieved   OT SHORT TERM GOAL #5   Title Patient will decrease pain during daily tasks to 4/10.   Status On-going           OT Long Term Goals - 11/20/14 1543    OT LONG TERM GOAL #1   Title  Patient will return to highest level of independence with all daily tasks using LUE.   Status On-going   OT LONG TERM GOAL #2   Title Patient will increase AROM to WNL to increase ability to complete daily tasks above shoulder height with less difficulty.  Status On-going   OT LONG TERM GOAL #3   Title Patient will increase LUE strength to 4/5 to increase ability to hold grandchildren.   Status On-going   OT LONG TERM GOAL #4   Title Patient will decrease fascial restrictions to trace amount.   Status On-going   OT LONG TERM GOAL #5   Title Patient will decrease pain level to 2/10 or less when completing daily tasks.   Status On-going               Plan - 12/20/14 1015    Clinical Impression Statement A:  Patient presented this date with active bleeding from anterior shoulder region.  OT cleaned area with keriklenz and dressed with gauze and paper tape.  Treatment limited to manual therapy and isometric strengthening.  Therapeutic exercises held in order to not irritate shoulder region.   Plan P:  Follow up on shoulder bleeding.  Resume therapeutic exercises as able.         Problem List Patient Active Problem List   Diagnosis Date Noted  . Arthritis of knee, left 06/16/2013  . S/P total knee replacement using cement 06/14/2013  . Osteoarthritis of left knee 03/21/2013  . BPH (benign prostatic hyperplasia)   . GERD (gastroesophageal reflux disease)   . CAD (coronary artery disease)   . Fever 03/20/2013  . Altered mental state 03/20/2013  . Postoperative fever 03/20/2013  . PNA (pneumonia) 03/20/2013  . Preop cardiovascular exam 11/24/2012  . Peroneal tendonitis 10/05/2012  . Arthritis of foot, degenerative 10/05/2012  . HTN (hypertension) 09/25/2012  . Hyperlipidemia 09/25/2012  . Atrial fibrillation 06/15/2012  . Long term (current) use of anticoagulants 06/15/2012  . Rotator cuff strain 02/04/2012  . Shoulder pain 02/04/2012  . Personal history of colonic  polyps 01/29/2011  . High risk medication use 01/29/2011    Vangie Bicker, OTR/L [phone]  12/20/2014, 10:17 AM  [place], [place], [address] Phone: [phone]   Fax:  [phone]

## 2014-12-22 ENCOUNTER — Encounter (HOSPITAL_COMMUNITY): Payer: Self-pay | Admitting: Occupational Therapy

## 2014-12-22 ENCOUNTER — Ambulatory Visit (HOSPITAL_COMMUNITY): Payer: Medicare Other | Admitting: Occupational Therapy

## 2014-12-22 ENCOUNTER — Ambulatory Visit (INDEPENDENT_AMBULATORY_CARE_PROVIDER_SITE_OTHER): Payer: Medicare Other | Admitting: Urology

## 2014-12-22 DIAGNOSIS — M25512 Pain in left shoulder: Secondary | ICD-10-CM

## 2014-12-22 DIAGNOSIS — R29898 Other symptoms and signs involving the musculoskeletal system: Secondary | ICD-10-CM | POA: Diagnosis not present

## 2014-12-22 DIAGNOSIS — N401 Enlarged prostate with lower urinary tract symptoms: Secondary | ICD-10-CM | POA: Diagnosis not present

## 2014-12-22 DIAGNOSIS — R972 Elevated prostate specific antigen [PSA]: Secondary | ICD-10-CM | POA: Diagnosis not present

## 2014-12-22 DIAGNOSIS — M25612 Stiffness of left shoulder, not elsewhere classified: Secondary | ICD-10-CM

## 2014-12-22 DIAGNOSIS — M25522 Pain in left elbow: Secondary | ICD-10-CM | POA: Diagnosis not present

## 2014-12-22 DIAGNOSIS — M629 Disorder of muscle, unspecified: Secondary | ICD-10-CM

## 2014-12-22 DIAGNOSIS — N138 Other obstructive and reflux uropathy: Secondary | ICD-10-CM

## 2014-12-22 DIAGNOSIS — M6289 Other specified disorders of muscle: Secondary | ICD-10-CM

## 2014-12-22 NOTE — Therapy (Signed)
[place], [place], [address] Phone: [phone]   Fax:  [phone]  Occupational Therapy Treatment  Patient Details  Name: Jerry Cain MRN: [id number] Date of Birth: 1931/05/23 Referring Provider:  Garald Balding, MD  Encounter Date: 12/22/2014      OT End of Session - 12/22/14 1127    Visit Number 14   Number of Visits 36   Date for OT Re-Evaluation 01/16/15   Authorization Type Medicare part A   Authorization Time Period before 20th visit   Authorization - Visit Number 63   Authorization - Number of Visits 20   OT Start Time 1017   OT Stop Time 1100   OT Time Calculation (min) 43 min   Activity Tolerance Treatment limited secondary to medical complications (Comment)   Behavior During Therapy [facility] for tasks assessed/performed      Past Medical History  Diagnosis Date  . Hypercholesterolemia     takes Atorvastatin daily  . Gout   . BPH (benign prostatic hyperplasia)     takes Proscar daily  . CAD (coronary artery disease) 2009    cardiac cath and PTCA by Dr. Tami Ribas  . Tubular adenoma 2001  . Diverticulosis 2007    tcs by Dr. Laural Golden  . GERD (gastroesophageal reflux disease)   . Adenomatous colon polyp 2001  . Osteoarthritis     left knee  . Complication of anesthesia     hard to wake up  . Hypertension     takes Imdur,Coreg,and Lisinopril daily  . Atrial fibrillation     takes Coumadin daily  . Pneumonia     many,many yrs ago  . Numbness     right hand pointer and middle finger  . Carpal tunnel syndrome     right  . Joint pain   . Joint swelling   . Nocturia   . Hypothyroidism     takes SYnthroid daily  . Anxiety     Past Surgical History  Procedure Laterality Date  . Hernia repair  2009    left inguinal  . Cataract extraction      bilateral  . Colonoscopy  2007    Dr. Laural Golden- L sided diverticulosis. Next TCS 2012 due to h/o tubular adenoma.  . Colonoscopy   02/26/2011    Procedure: COLONOSCOPY;  Surgeon: Daneil Dolin, MD;  [facility];  Service: Endoscopy;  Laterality: N/A;  11:05  . Tee without cardioversion  12/12/2011    Procedure: TRANSESOPHAGEAL ECHOCARDIOGRAM (TEE);  Surgeon: Sanda Klein, MD;  Location: [facility] ENDOSCOPY;  Service: Cardiovascular;  Laterality: N/A;   successful/sinus rhythm  . Cardioversion  12/12/2011    Procedure: CARDIOVERSION;  Surgeon: Sanda Klein, MD;  Location: [place];  Service: Cardiovascular;  Laterality: N/A;  . Bunionectomy Right 03/17/2013    Procedure: Lillard Anes, Arthroplasty 2nd toe right foot;  Surgeon: Marcheta Grammes, DPM;  [facility]: [facility];  Service: Orthopedics;  Laterality: Right;  . Aiken osteotomy Right 03/17/2013    Procedure: Barbie Banner OSTEOTOMY;  Surgeon: Marcheta Grammes, DPM;  [facility]: [facility];  Service: Orthopedics;  Laterality: Right;  . Flexor tenotomy Left 03/17/2013    Procedure: PERCUTANEOUS FLEXOR TENOTOMY 3RD TOE LEFT FOOT;  Surgeon: Marcheta Grammes, DPM;  [facility];  Service: Orthopedics;  Laterality: Left;  . Cardiac catheterization  06/11/2007    PTCA X2 in 06/2007:  2 overlapping Cypher stents to LAD (2.5x13 and 3.0x23)  . Cardiac catheterization  06/21/2007    Cypher stent 2.5 x 13  to OM branch  . Coronary angioplasty      x 3   . Total knee arthroplasty Left 06/14/2013    Procedure: TOTAL KNEE ARTHROPLASTY;  Surgeon: Garald Balding, MD;  Location: [place];  Service: Orthopedics;  Laterality: Left;  . Rot Left     There were no vitals filed for this visit.  Visit Diagnosis:  Shoulder stiffness, left  Shoulder weakness  Pain in joint, shoulder region, left  Tight fascia      Subjective Assessment - 12/22/14 1021    Subjective  S: The doctor called in a prescription for me just in case there is any infection in my shoulder.    Currently in Pain? No/denies            [facility] OT Assessment - 12/22/14 1122    Assessment    Diagnosis Left Shoulder RCR, SAD, DCR   Precautions   Precautions Shoulder   Type of Shoulder Precautions PROM for 4 weeks (until 12/07/14) then progress to AAROM (until 01/04/15). Progress to AROM and progress as tolerated.                  OT Treatments/Exercises (OP) - 12/22/14 1021    Exercises   Exercises Shoulder   Shoulder Exercises: Supine   Protraction PROM;10 reps;AAROM;5 reps   Protraction Limitations Able to complete 75% range   Horizontal ABduction PROM;10 reps;AAROM;5 reps   External Rotation PROM;10 reps;AAROM;5 reps   Internal Rotation PROM;10 reps;AAROM;5 reps   Flexion PROM;10 reps;AAROM;5 reps   ABduction PROM;10 reps;AAROM;5 reps   Other Supine Exercises elbow flexion and extension PROM X 10   Shoulder Exercises: Seated   Elevation AROM;20 reps   Extension AROM;20 reps   Row AROM;20 reps   Shoulder Exercises: ROM/Strengthening   Wall Wash 1'   Thumb Tacks 1'   Manual Therapy   Manual Therapy Myofascial release   Myofascial Release Myofascial release to left upper arm, trapezius, and scapularis region to decrease fascial restrictions and increase joint mobility in a pain free zone.  MFR to left elbow region to decrease elbow joint stiffness and pain.                   OT Short Term Goals - 12/12/14 1044    OT SHORT TERM GOAL #1   Title Patient will be education and independent with HEP.   Status On-going   OT SHORT TERM GOAL #2   Title Patient will increase PROM to [facility] to increase ability to get dressed with less difficulty.   Status On-going   OT SHORT TERM GOAL #3   Title Patient will increase strength to 3/5 to increase ability to complete daily tasks below shoulder height.    Status On-going   OT SHORT TERM GOAL #4   Title Patient will decrease fascial restrictions from max to min amount.   Status Achieved   OT SHORT TERM GOAL #5   Title Patient will decrease pain during daily tasks to 4/10.   Status On-going           OT  Long Term Goals - 11/20/14 1543    OT LONG TERM GOAL #1   Title Patient will return to highest level of independence with all daily tasks using LUE.   Status On-going   OT LONG TERM GOAL #2   Title Patient will increase AROM to WNL to increase ability to complete daily tasks  above shoulder height with less difficulty.    Status On-going   OT LONG TERM GOAL #3   Title Patient will increase LUE strength to 4/5 to increase ability to hold grandchildren.   Status On-going   OT LONG TERM GOAL #4   Title Patient will decrease fascial restrictions to trace amount.   Status On-going   OT LONG TERM GOAL #5   Title Patient will decrease pain level to 2/10 or less when completing daily tasks.   Status On-going               Plan - 12/22/14 1127    Clinical Impression Statement A: Pt has not experienced any additonal bleeding from anterior shoulder region since previous session on Wednesday. There is bruising around site. Resumed therapeutic exercises, pt continues to be limited by elbow pain at times. Added wall wash.    Plan P: Attempt pulleys in flexion if able to tolerate. Focus on achieving full range during AAROM protraction exercise        Problem List Patient Active Problem List   Diagnosis Date Noted  . Arthritis of knee, left 06/16/2013  . S/P total knee replacement using cement 06/14/2013  . Osteoarthritis of left knee 03/21/2013  . BPH (benign prostatic hyperplasia)   . GERD (gastroesophageal reflux disease)   . CAD (coronary artery disease)   . Fever 03/20/2013  . Altered mental state 03/20/2013  . Postoperative fever 03/20/2013  . PNA (pneumonia) 03/20/2013  . Preop cardiovascular exam 11/24/2012  . Peroneal tendonitis 10/05/2012  . Arthritis of foot, degenerative 10/05/2012  . HTN (hypertension) 09/25/2012  . Hyperlipidemia 09/25/2012  . Atrial fibrillation 06/15/2012  . Long term (current) use of anticoagulants 06/15/2012  . Rotator cuff strain 02/04/2012  .  Shoulder pain 02/04/2012  . Personal history of colonic polyps 01/29/2011  . High risk medication use 01/29/2011    Guadelupe Sabin, OTR/L  [phone] 12/22/2014, 11:30 AM  [facility] [address] [place], [place], [address] Phone: [phone]   Fax:  [phone]

## 2014-12-25 ENCOUNTER — Ambulatory Visit (HOSPITAL_COMMUNITY): Payer: Medicare Other | Admitting: Specialist

## 2014-12-25 DIAGNOSIS — M629 Disorder of muscle, unspecified: Secondary | ICD-10-CM | POA: Diagnosis not present

## 2014-12-25 DIAGNOSIS — R29898 Other symptoms and signs involving the musculoskeletal system: Secondary | ICD-10-CM | POA: Diagnosis not present

## 2014-12-25 DIAGNOSIS — M25612 Stiffness of left shoulder, not elsewhere classified: Secondary | ICD-10-CM | POA: Diagnosis not present

## 2014-12-25 DIAGNOSIS — M25522 Pain in left elbow: Secondary | ICD-10-CM

## 2014-12-25 DIAGNOSIS — M25512 Pain in left shoulder: Secondary | ICD-10-CM | POA: Diagnosis not present

## 2014-12-25 NOTE — Therapy (Signed)
[place], [place], [address] Phone: [phone]   Fax:  [phone]  Occupational Therapy Treatment  Patient Details  Name: Jerry Cain MRN: [id number] Date of Birth: [birth year] Referring Provider:  Garald Balding, MD  Encounter Date: 12/25/2014      OT End of Session - 12/25/14 1201    Visit Number 15   Number of Visits 36   Date for OT Re-Evaluation 01/16/15   Authorization Type Medicare part A   Authorization Time Period before 20th visit   Authorization - Visit Number 15   Authorization - Number of Visits 20   OT Start Time 1107   OT Stop Time 1144   OT Time Calculation (min) 37 min   Activity Tolerance Patient tolerated treatment well   Behavior During Therapy [facility] for tasks assessed/performed      Past Medical History  Diagnosis Date  . Hypercholesterolemia     takes Atorvastatin daily  . Gout   . BPH (benign prostatic hyperplasia)     takes Proscar daily  . CAD (coronary artery disease) 2009    cardiac cath and PTCA by Dr. Tami Ribas  . Tubular adenoma 2001  . Diverticulosis 2007    tcs by Dr. Laural Golden  . GERD (gastroesophageal reflux disease)   . Adenomatous colon polyp 2001  . Osteoarthritis     left knee  . Complication of anesthesia     hard to wake up  . Hypertension     takes Imdur,Coreg,and Lisinopril daily  . Atrial fibrillation     takes Coumadin daily  . Pneumonia     many,many yrs ago  . Numbness     right hand pointer and middle finger  . Carpal tunnel syndrome     right  . Joint pain   . Joint swelling   . Nocturia   . Hypothyroidism     takes SYnthroid daily  . Anxiety     Past Surgical History  Procedure Laterality Date  . Hernia repair  2009    left inguinal  . Cataract extraction      bilateral  . Colonoscopy  2007    Dr. Laural Golden- L sided diverticulosis. Next TCS 2012 due to h/o tubular adenoma.  . Colonoscopy  02/26/2011    Procedure:  COLONOSCOPY;  Surgeon: Daneil Dolin, MD;  [facility];  Service: Endoscopy;  Laterality: N/A;  11:05  . Tee without cardioversion  12/12/2011    Procedure: TRANSESOPHAGEAL ECHOCARDIOGRAM (TEE);  Surgeon: Sanda Klein, MD;  Location: [facility] ENDOSCOPY;  Service: Cardiovascular;  Laterality: N/A;   successful/sinus rhythm  . Cardioversion  12/12/2011    Procedure: CARDIOVERSION;  Surgeon: Sanda Klein, MD;  Location: [place];  Service: Cardiovascular;  Laterality: N/A;  . Bunionectomy Right 03/17/2013    Procedure: Lillard Anes, Arthroplasty 2nd toe right foot;  Surgeon: Marcheta Grammes, DPM;  [facility];  Service: Orthopedics;  Laterality: Right;  . Aiken osteotomy Right 03/17/2013    Procedure: Barbie Banner OSTEOTOMY;  Surgeon: Marcheta Grammes, DPM;  [facility];  Service: Orthopedics;  Laterality: Right;  . Flexor tenotomy Left 03/17/2013    Procedure: PERCUTANEOUS FLEXOR TENOTOMY 3RD TOE LEFT FOOT;  Surgeon: Marcheta Grammes, DPM;  [facility];  Service: Orthopedics;  Laterality: Left;  . Cardiac catheterization  06/11/2007    PTCA X2 in 06/2007:  2 overlapping Cypher stents to LAD (2.5x13 and 3.0x23)  . Cardiac catheterization  06/21/2007  Cypher stent 2.5 x 13  to OM branch  . Coronary angioplasty      x 3   . Total knee arthroplasty Left 06/14/2013    Procedure: TOTAL KNEE ARTHROPLASTY;  Surgeon: Garald Balding, MD;  Location: [place];  Service: Orthopedics;  Laterality: Left;  . Rot Left     There were no vitals filed for this visit.  Visit Diagnosis:  Shoulder stiffness, left  Pain in joint, shoulder region, left  Pain in elbow joint, left          [facility] OT Assessment - 12/25/14 0001    Assessment   Diagnosis Left Shoulder RCR, SAD, DCR   Precautions   Precautions Shoulder   Type of Shoulder Precautions PROM for 4 weeks (until 12/07/14) then progress to AAROM (until 01/04/15). Progress to AROM and progress as tolerated.   PROM    Left Shoulder Flexion 120 Degrees  95   Left Shoulder ABduction 100 Degrees  90   Left Shoulder Internal Rotation 90 Degrees  90   Left Shoulder External Rotation 63 Degrees  73   Left Elbow Flexion 126  120   Left Elbow Extension -25  -16                  OT Treatments/Exercises (OP) - 12/25/14 0001    Shoulder Exercises: Supine   Protraction PROM;AAROM;10 reps   External Rotation PROM;AAROM;10 reps   Internal Rotation PROM;AAROM;10 reps   Flexion PROM;AAROM;10 reps   ABduction PROM;AAROM;10 reps   Other Supine Exercises serratus anterior punch with min facilitation from OT 15 times   Shoulder Exercises: Seated   Elevation AROM;20 reps   Extension AROM;20 reps   Row AROM;20 reps   Shoulder Exercises: Pulleys   Flexion 1 minute   Flexion Limitations with elbow flexed   Manual Therapy   Manual Therapy Myofascial release   Myofascial Release Myofascial release to left upper arm, trapezius, and scapularis region to decrease fascial restrictions and increase joint mobility in a pain free zone.  MFR to left elbow region to decrease elbow joint stiffness and pain.                   OT Short Term Goals - 12/12/14 1044    OT SHORT TERM GOAL #1   Title Patient will be education and independent with HEP.   Status On-going   OT SHORT TERM GOAL #2   Title Patient will increase PROM to [facility] to increase ability to get dressed with less difficulty.   Status On-going   OT SHORT TERM GOAL #3   Title Patient will increase strength to 3/5 to increase ability to complete daily tasks below shoulder height.    Status On-going   OT SHORT TERM GOAL #4   Title Patient will decrease fascial restrictions from max to min amount.   Status Achieved   OT SHORT TERM GOAL #5   Title Patient will decrease pain during daily tasks to 4/10.   Status On-going           OT Long Term Goals - 11/20/14 1543    OT LONG TERM GOAL #1   Title Patient will return to highest level  of independence with all daily tasks using LUE.   Status On-going   OT LONG TERM GOAL #2   Title Patient will increase AROM to WNL to increase ability to complete daily tasks above shoulder height with less difficulty.    Status On-going  OT LONG TERM GOAL #3   Title Patient will increase LUE strength to 4/5 to increase ability to hold grandchildren.   Status On-going   OT LONG TERM GOAL #4   Title Patient will decrease fascial restrictions to trace amount.   Status On-going   OT LONG TERM GOAL #5   Title Patient will decrease pain level to 2/10 or less when completing daily tasks.   Status On-going               Plan - 12/25/14 1201    Clinical Impression Statement A:  Patient has significant bruising in anterior shoulder and upper arm, however, no additional bleeding from anterior shoulder.  Noted scar restriction in anterior shoulder near intiial bruising site.  Patient has improved PROM from previous assessment.  ELbow pain and recent bruising has limited patients progress.    Plan P:  Add abduction pulleys, improve PROM and AAROM by 25% in supine.  Add protraction and retraction in sidelying.        Problem List Patient Active Problem List   Diagnosis Date Noted  . Arthritis of knee, left 06/16/2013  . S/P total knee replacement using cement 06/14/2013  . Osteoarthritis of left knee 03/21/2013  . BPH (benign prostatic hyperplasia)   . GERD (gastroesophageal reflux disease)   . CAD (coronary artery disease)   . Fever 03/20/2013  . Altered mental state 03/20/2013  . Postoperative fever 03/20/2013  . PNA (pneumonia) 03/20/2013  . Preop cardiovascular exam 11/24/2012  . Peroneal tendonitis 10/05/2012  . Arthritis of foot, degenerative 10/05/2012  . HTN (hypertension) 09/25/2012  . Hyperlipidemia 09/25/2012  . Atrial fibrillation 06/15/2012  . Long term (current) use of anticoagulants 06/15/2012  . Rotator cuff strain 02/04/2012  . Shoulder pain 02/04/2012  .  Personal history of colonic polyps 01/29/2011  . High risk medication use 01/29/2011    Vangie Bicker, OTR/L [phone]  12/25/2014, 12:06 PM  [facility] [address] [place], [place], [address] Phone: [phone]   Fax:  [phone]

## 2014-12-27 ENCOUNTER — Ambulatory Visit (HOSPITAL_COMMUNITY): Payer: Medicare Other | Admitting: Specialist

## 2014-12-27 DIAGNOSIS — M25522 Pain in left elbow: Secondary | ICD-10-CM | POA: Diagnosis not present

## 2014-12-27 DIAGNOSIS — M25612 Stiffness of left shoulder, not elsewhere classified: Secondary | ICD-10-CM

## 2014-12-27 DIAGNOSIS — M25512 Pain in left shoulder: Secondary | ICD-10-CM | POA: Diagnosis not present

## 2014-12-27 DIAGNOSIS — R29898 Other symptoms and signs involving the musculoskeletal system: Secondary | ICD-10-CM | POA: Diagnosis not present

## 2014-12-27 DIAGNOSIS — M629 Disorder of muscle, unspecified: Secondary | ICD-10-CM | POA: Diagnosis not present

## 2014-12-27 NOTE — Therapy (Signed)
[place], [place], [address] Phone: [phone]   Fax:  [phone]  Occupational Therapy Treatment  Patient Details  Name: Jerry Cain MRN: [id number] Date of Birth: 1931-05-17 Referring Provider:  Garald Balding, MD  Encounter Date: 12/27/2014      OT End of Session - 12/27/14 0954    Visit Number 16   Number of Visits 36   Date for OT Re-Evaluation 01/16/15   Authorization Type Medicare part A   Authorization Time Period before 20th visit   Authorization - Visit Number 32   Authorization - Number of Visits 20   OT Start Time 0850   OT Stop Time 0928   OT Time Calculation (min) 38 min   Activity Tolerance Patient tolerated treatment well   Behavior During Therapy [facility] for tasks assessed/performed      Past Medical History  Diagnosis Date  . Hypercholesterolemia     takes Atorvastatin daily  . Gout   . BPH (benign prostatic hyperplasia)     takes Proscar daily  . CAD (coronary artery disease) 2009    cardiac cath and PTCA by Dr. Tami Ribas  . Tubular adenoma 2001  . Diverticulosis 2007    tcs by Dr. Laural Golden  . GERD (gastroesophageal reflux disease)   . Adenomatous colon polyp 2001  . Osteoarthritis     left knee  . Complication of anesthesia     hard to wake up  . Hypertension     takes Imdur,Coreg,and Lisinopril daily  . Atrial fibrillation     takes Coumadin daily  . Pneumonia     many,many yrs ago  . Numbness     right hand pointer and middle finger  . Carpal tunnel syndrome     right  . Joint pain   . Joint swelling   . Nocturia   . Hypothyroidism     takes SYnthroid daily  . Anxiety     Past Surgical History  Procedure Laterality Date  . Hernia repair  2009    left inguinal  . Cataract extraction      bilateral  . Colonoscopy  2007    Dr. Laural Golden- L sided diverticulosis. Next TCS 2012 due to h/o tubular adenoma.  . Colonoscopy  02/26/2011    Procedure:  COLONOSCOPY;  Surgeon: Daneil Dolin, MD;  [facility];  Service: Endoscopy;  Laterality: N/A;  11:05  . Tee without cardioversion  12/12/2011    Procedure: TRANSESOPHAGEAL ECHOCARDIOGRAM (TEE);  Surgeon: Sanda Klein, MD;  Location: [facility] ENDOSCOPY;  Service: Cardiovascular;  Laterality: N/A;   successful/sinus rhythm  . Cardioversion  12/12/2011    Procedure: CARDIOVERSION;  Surgeon: Sanda Klein, MD;  Location: [place];  Service: Cardiovascular;  Laterality: N/A;  . Bunionectomy Right 03/17/2013    Procedure: Lillard Anes, Arthroplasty 2nd toe right foot;  Surgeon: Marcheta Grammes, DPM;  [facility];  Service: Orthopedics;  Laterality: Right;  . Aiken osteotomy Right 03/17/2013    Procedure: Barbie Banner OSTEOTOMY;  Surgeon: Marcheta Grammes, DPM;  [facility];  Service: Orthopedics;  Laterality: Right;  . Flexor tenotomy Left 03/17/2013    Procedure: PERCUTANEOUS FLEXOR TENOTOMY 3RD TOE LEFT FOOT;  Surgeon: Marcheta Grammes, DPM;  [facility];  Service: Orthopedics;  Laterality: Left;  . Cardiac catheterization  06/11/2007    PTCA X2 in 06/2007:  2 overlapping Cypher stents to LAD (2.5x13 and 3.0x23)  . Cardiac catheterization  06/21/2007  Cypher stent 2.5 x 13  to OM branch  . Coronary angioplasty      x 3   . Total knee arthroplasty Left 06/14/2013    Procedure: TOTAL KNEE ARTHROPLASTY;  Surgeon: Garald Balding, MD;  Location: [place];  Service: Orthopedics;  Laterality: Left;  . Rot Left     There were no vitals filed for this visit.  Visit Diagnosis:  Shoulder stiffness, left  Pain in joint, shoulder region, left  Pain in elbow joint, left      Subjective Assessment - 12/27/14 0851    Subjective  S:  I couldnt do some of the exercises - it hurt some.     Currently in Pain? Yes   Pain Score 3    Pain Location Shoulder   Pain Orientation Left   Pain Descriptors / Indicators Aching            [facility] OT Assessment - 12/27/14  0001    Assessment   Diagnosis Left Shoulder RCR, SAD, DCR   Precautions   Precautions Shoulder   Type of Shoulder Precautions PROM for 4 weeks (until 12/07/14) then progress to AAROM (until 01/04/15). Progress to AROM and progress as tolerated.   Shoulder Interventions Shoulder abduction pillow                  OT Treatments/Exercises (OP) - 12/27/14 0001    Exercises   Exercises Shoulder   Shoulder Exercises: Supine   Protraction PROM;10 reps;AAROM;12 reps   External Rotation PROM;10 reps;AAROM;12 reps   Internal Rotation PROM;10 reps;AAROM;12 reps   Flexion PROM;10 reps;AAROM;12 reps   ABduction PROM;10 reps;AAROM;12 reps   Shoulder Exercises: Sidelying   External Rotation AROM;10 reps   Internal Rotation AROM;10 reps   ABduction AROM;10 reps   ABduction Limitations with elbow flexed   Other Sidelying Exercises protraction/retranction AROM 10 times   Shoulder Exercises: Standing   Extension Theraband;10 reps   Theraband Level (Shoulder Extension) Level 2 (Red)   Row Theraband;10 reps   Theraband Level (Shoulder Row) Level 2 (Red)   Shoulder Exercises: Pulleys   Flexion 2 minutes   Flexion Limitations with elbow flexed   ABduction 2 minutes   ABduction Limitations with elbow flexed    Manual Therapy   Manual Therapy Myofascial release   Myofascial Release Myofascial release to left upper arm, trapezius, and scapularis region to decrease fascial restrictions and increase joint mobility in a pain free zone.  MFR to left elbow region to decrease elbow joint stiffness and pain.                   OT Short Term Goals - 12/12/14 1044    OT SHORT TERM GOAL #1   Title Patient will be education and independent with HEP.   Status On-going   OT SHORT TERM GOAL #2   Title Patient will increase PROM to [facility] to increase ability to get dressed with less difficulty.   Status On-going   OT SHORT TERM GOAL #3   Title Patient will increase strength to 3/5 to increase  ability to complete daily tasks below shoulder height.    Status On-going   OT SHORT TERM GOAL #4   Title Patient will decrease fascial restrictions from max to min amount.   Status Achieved   OT SHORT TERM GOAL #5   Title Patient will decrease pain during daily tasks to 4/10.   Status On-going  OT Long Term Goals - 11/20/14 1543    OT LONG TERM GOAL #1   Title Patient will return to highest level of independence with all daily tasks using LUE.   Status On-going   OT LONG TERM GOAL #2   Title Patient will increase AROM to WNL to increase ability to complete daily tasks above shoulder height with less difficulty.    Status On-going   OT LONG TERM GOAL #3   Title Patient will increase LUE strength to 4/5 to increase ability to hold grandchildren.   Status On-going   OT LONG TERM GOAL #4   Title Patient will decrease fascial restrictions to trace amount.   Status On-going   OT LONG TERM GOAL #5   Title Patient will decrease pain level to 2/10 or less when completing daily tasks.   Status On-going               Plan - 12/27/14 0954    Clinical Impression Statement A:  Completed A/AROM and A/ROM in sidelying this date, with elbow flexed patient able to complete most exercises with moderate discomfort.  Added abduction to pulleys this date. Patient feels exercises were helpful in improving range of motion this date.    Plan P:  Add external and internal rotation to theraband exercises, attempt assisted flexion on pvc pipe        Problem List Patient Active Problem List   Diagnosis Date Noted  . Arthritis of knee, left 06/16/2013  . S/P total knee replacement using cement 06/14/2013  . Osteoarthritis of left knee 03/21/2013  . BPH (benign prostatic hyperplasia)   . GERD (gastroesophageal reflux disease)   . CAD (coronary artery disease)   . Fever 03/20/2013  . Altered mental state 03/20/2013  . Postoperative fever 03/20/2013  . PNA (pneumonia) 03/20/2013   . Preop cardiovascular exam 11/24/2012  . Peroneal tendonitis 10/05/2012  . Arthritis of foot, degenerative 10/05/2012  . HTN (hypertension) 09/25/2012  . Hyperlipidemia 09/25/2012  . Atrial fibrillation 06/15/2012  . Long term (current) use of anticoagulants 06/15/2012  . Rotator cuff strain 02/04/2012  . Shoulder pain 02/04/2012  . Personal history of colonic polyps 01/29/2011  . High risk medication use 01/29/2011    Vangie Bicker, OTR/L [phone]  12/27/2014, 9:58 AM  [facility] [address] [place], [place], [address] Phone: [phone]   Fax:  [phone]

## 2014-12-29 ENCOUNTER — Ambulatory Visit (HOSPITAL_COMMUNITY): Payer: Medicare Other | Admitting: Specialist

## 2014-12-29 DIAGNOSIS — M25612 Stiffness of left shoulder, not elsewhere classified: Secondary | ICD-10-CM

## 2014-12-29 DIAGNOSIS — M629 Disorder of muscle, unspecified: Secondary | ICD-10-CM | POA: Diagnosis not present

## 2014-12-29 DIAGNOSIS — M25512 Pain in left shoulder: Secondary | ICD-10-CM | POA: Diagnosis not present

## 2014-12-29 DIAGNOSIS — R29898 Other symptoms and signs involving the musculoskeletal system: Secondary | ICD-10-CM | POA: Diagnosis not present

## 2014-12-29 DIAGNOSIS — M25522 Pain in left elbow: Secondary | ICD-10-CM | POA: Diagnosis not present

## 2014-12-29 NOTE — Therapy (Signed)
[place], [place], [address] Phone: [phone]   Fax:  [phone]  Occupational Therapy Treatment  Patient Details  Name: Jerry Cain MRN: [id number] Date of Birth: 10-11-1931 Referring Provider:  Garald Balding, MD  Encounter Date: 12/29/2014      OT End of Session - 12/29/14 1153    Visit Number 17   Number of Visits 36   Date for OT Re-Evaluation 01/16/15   Authorization Type Medicare part A   Authorization Time Period before 20th visit   Authorization - Visit Number 17   Authorization - Number of Visits 20   OT Start Time 0840   OT Stop Time 0928   OT Time Calculation (min) 48 min   Activity Tolerance Patient tolerated treatment well   Behavior During Therapy [facility] for tasks assessed/performed      Past Medical History  Diagnosis Date  . Hypercholesterolemia     takes Atorvastatin daily  . Gout   . BPH (benign prostatic hyperplasia)     takes Proscar daily  . CAD (coronary artery disease) 2009    cardiac cath and PTCA by Dr. Tami Ribas  . Tubular adenoma 2001  . Diverticulosis 2007    tcs by Dr. Laural Golden  . GERD (gastroesophageal reflux disease)   . Adenomatous colon polyp 2001  . Osteoarthritis     left knee  . Complication of anesthesia     hard to wake up  . Hypertension     takes Imdur,Coreg,and Lisinopril daily  . Atrial fibrillation     takes Coumadin daily  . Pneumonia     many,many yrs ago  . Numbness     right hand pointer and middle finger  . Carpal tunnel syndrome     right  . Joint pain   . Joint swelling   . Nocturia   . Hypothyroidism     takes SYnthroid daily  . Anxiety     Past Surgical History  Procedure Laterality Date  . Hernia repair  2009    left inguinal  . Cataract extraction      bilateral  . Colonoscopy  2007    Dr. Laural Golden- L sided diverticulosis. Next TCS 2012 due to h/o tubular adenoma.  . Colonoscopy  02/26/2011    Procedure:  COLONOSCOPY;  Surgeon: Daneil Dolin, MD;  [facility];  Service: Endoscopy;  Laterality: N/A;  11:05  . Tee without cardioversion  12/12/2011    Procedure: TRANSESOPHAGEAL ECHOCARDIOGRAM (TEE);  Surgeon: Sanda Klein, MD;  [facility];  Service: Cardiovascular;  Laterality: N/A;   successful/sinus rhythm  . Cardioversion  12/12/2011    Procedure: CARDIOVERSION;  Surgeon: Sanda Klein, MD;  Location: [place];  Service: Cardiovascular;  Laterality: N/A;  . Bunionectomy Right 03/17/2013    Procedure: Lillard Anes, Arthroplasty 2nd toe right foot;  Surgeon: Marcheta Grammes, DPM;  Location: [facility];  Service: Orthopedics;  Laterality: Right;  . Aiken osteotomy Right 03/17/2013    Procedure: Barbie Banner OSTEOTOMY;  Surgeon: Marcheta Grammes, DPM;  Location: [facility];  Service: Orthopedics;  Laterality: Right;  . Flexor tenotomy Left 03/17/2013    Procedure: PERCUTANEOUS FLEXOR TENOTOMY 3RD TOE LEFT FOOT;  Surgeon: Marcheta Grammes, DPM;  Location: [facility];  Service: Orthopedics;  Laterality: Left;  . Cardiac catheterization  06/11/2007    PTCA X2 in 06/2007:  2 overlapping Cypher stents to LAD (2.5x13 and 3.0x23)  . Cardiac catheterization  06/21/2007  Cypher stent 2.5 x 13  to OM branch  . Coronary angioplasty      x 3   . Total knee arthroplasty Left 06/14/2013    Procedure: TOTAL KNEE ARTHROPLASTY;  Surgeon: Garald Balding, MD;  Location: [place];  Service: Orthopedics;  Laterality: Left;  . Rot Left     There were no vitals filed for this visit.  Visit Diagnosis:  Shoulder stiffness, left  Pain in joint, shoulder region, left  Pain in elbow joint, left  Shoulder weakness      Subjective Assessment - 12/29/14 0933    Subjective  s:  i did something I shouldnt have.  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 Yes   Pain Score 3    Pain Location Shoulder   Pain Orientation Left   Pain Descriptors / Indicators Aching             [facility] OT Assessment - 12/29/14 0001    Assessment   Diagnosis Left Shoulder RCR, SAD, DCR   Precautions   Precautions Shoulder   Type of Shoulder Precautions PROM for 4 weeks (until 12/07/14) then progress to AAROM (until 01/04/15). Progress to AROM and progress as tolerated.                  OT Treatments/Exercises (OP) - 12/29/14 0001    Exercises   Exercises Shoulder   Shoulder Exercises: Supine   Protraction PROM;10 reps;AAROM;15 reps   External Rotation PROM;10 reps;AAROM;15 reps   Internal Rotation PROM;10 reps;AAROM;15 reps   Flexion PROM;10 reps;AAROM;15 reps   ABduction PROM;10 reps;AAROM;15 reps   Other Supine Exercises serratus anterior punch A/ROM 15 times   Shoulder Exercises: Seated   Protraction AROM;10 reps   External Rotation 10 reps;AAROM   Internal Rotation 10 reps;AAROM   Flexion 10 reps;AAROM   Shoulder Exercises: Sidelying   External Rotation AROM;12 reps   Internal Rotation AROM;12 reps   ABduction AROM;12 reps   ABduction Limitations with elbow flexed   Other Sidelying Exercises protraction/retranction AROM 10 times   Shoulder Exercises: Standing   External Rotation Theraband;10 reps   Theraband Level (Shoulder External Rotation) Level 2 (Red)   Internal Rotation Theraband;10 reps   Theraband Level (Shoulder Internal Rotation) Level 2 (Red)   Extension Theraband;10 reps   Theraband Level (Shoulder Extension) Level 2 (Red)   Row Theraband;10 reps   Theraband Level (Shoulder Row) Level 2 (Red)   Shoulder Exercises: Pulleys   Flexion 2 minutes   ABduction 2 minutes   Shoulder Exercises: ROM/Strengthening   Other ROM/Strengthening Exercises standing assisted flexion on PVC pipe 10 times    Manual Therapy   Manual Therapy Myofascial release   Myofascial Release Myofascial release to left upper arm, trapezius, and scapularis region to decrease fascial restrictions and increase joint mobility in a pain free zone.  MFR to left elbow region to  decrease elbow joint stiffness and pain.                   OT Short Term Goals - 12/12/14 1044    OT SHORT TERM GOAL #1   Title Patient will be education and independent with HEP.   Status On-going   OT SHORT TERM GOAL #2   Title Patient will increase PROM to [facility] to increase ability to get dressed with less difficulty.   Status On-going   OT SHORT TERM GOAL #3   Title Patient will increase strength to 3/5  to increase ability to complete daily tasks below shoulder height.    Status On-going   OT SHORT TERM GOAL #4   Title Patient will decrease fascial restrictions from max to min amount.   Status Achieved   OT SHORT TERM GOAL #5   Title Patient will decrease pain during daily tasks to 4/10.   Status On-going           OT Long Term Goals - 11/20/14 1543    OT LONG TERM GOAL #1   Title Patient will return to highest level of independence with all daily tasks using LUE.   Status On-going   OT LONG TERM GOAL #2   Title Patient will increase AROM to WNL to increase ability to complete daily tasks above shoulder height with less difficulty.    Status On-going   OT LONG TERM GOAL #3   Title Patient will increase LUE strength to 4/5 to increase ability to hold grandchildren.   Status On-going   OT LONG TERM GOAL #4   Title Patient will decrease fascial restrictions to trace amount.   Status On-going   OT LONG TERM GOAL #5   Title Patient will decrease pain level to 2/10 or less when completing daily tasks.   Status On-going               Plan - 12/29/14 1154    Clinical Impression Statement A:  Added external rotation and internal rotaiton to theraband exercises.  able to complete pulleys with elbow extended vs flexed this date.   Plan P:  add abduction and horizontal abduction to seated dowel rod exercises.        Problem List Patient Active Problem List   Diagnosis Date Noted  . Arthritis of knee, left 06/16/2013  . S/P total knee replacement using  cement 06/14/2013  . Osteoarthritis of left knee 03/21/2013  . BPH (benign prostatic hyperplasia)   . GERD (gastroesophageal reflux disease)   . CAD (coronary artery disease)   . Fever 03/20/2013  . Altered mental state 03/20/2013  . Postoperative fever 03/20/2013  . PNA (pneumonia) 03/20/2013  . Preop cardiovascular exam 11/24/2012  . Peroneal tendonitis 10/05/2012  . Arthritis of foot, degenerative 10/05/2012  . HTN (hypertension) 09/25/2012  . Hyperlipidemia 09/25/2012  . Atrial fibrillation 06/15/2012  . Long term (current) use of anticoagulants 06/15/2012  . Rotator cuff strain 02/04/2012  . Shoulder pain 02/04/2012  . Personal history of colonic polyps 01/29/2011  . High risk medication use 01/29/2011    Vangie Bicker, OTR/L [phone]  12/29/2014, 11:57 AM  [place], [place], [address] Phone: [phone]   Fax:  [phone]

## 2015-01-01 ENCOUNTER — Encounter (HOSPITAL_COMMUNITY): Payer: Medicare Other | Admitting: Specialist

## 2015-01-03 ENCOUNTER — Encounter (HOSPITAL_COMMUNITY): Payer: Medicare Other | Admitting: Specialist

## 2015-01-03 ENCOUNTER — Ambulatory Visit (HOSPITAL_COMMUNITY): Payer: Medicare Other | Admitting: Specialist

## 2015-01-03 DIAGNOSIS — M25612 Stiffness of left shoulder, not elsewhere classified: Secondary | ICD-10-CM

## 2015-01-03 DIAGNOSIS — R29898 Other symptoms and signs involving the musculoskeletal system: Secondary | ICD-10-CM | POA: Diagnosis not present

## 2015-01-03 DIAGNOSIS — M25512 Pain in left shoulder: Secondary | ICD-10-CM

## 2015-01-03 DIAGNOSIS — M629 Disorder of muscle, unspecified: Secondary | ICD-10-CM | POA: Diagnosis not present

## 2015-01-03 DIAGNOSIS — M25522 Pain in left elbow: Secondary | ICD-10-CM

## 2015-01-03 NOTE — Therapy (Signed)
[place], [place], [address] Phone: [phone]   Fax:  [phone]  Occupational Therapy Treatment  Patient Details  Name: Jerry Cain MRN: [id number] Date of Birth: [birth year] Referring Provider:  Garald Balding, MD  Encounter Date: 01/03/2015      OT End of Session - 01/03/15 1406    Visit Number 18   Number of Visits 36   Date for OT Re-Evaluation 01/16/15   Authorization Type Medicare part A   Authorization Time Period before 20th visit   Authorization - Visit Number 18   Authorization - Number of Visits 20   OT Start Time 1316   OT Stop Time 1350   OT Time Calculation (min) 34 min   Equipment Utilized During Treatment Patient arrived 71' late due to being out of town   Activity Tolerance Patient tolerated treatment well   Behavior During Therapy [facility] for tasks assessed/performed      Past Medical History  Diagnosis Date  . Hypercholesterolemia     takes Atorvastatin daily  . Gout   . BPH (benign prostatic hyperplasia)     takes Proscar daily  . CAD (coronary artery disease) 2009    cardiac cath and PTCA by Dr. Tami Ribas  . Tubular adenoma 2001  . Diverticulosis 2007    tcs by Dr. Laural Golden  . GERD (gastroesophageal reflux disease)   . Adenomatous colon polyp 2001  . Osteoarthritis     left knee  . Complication of anesthesia     hard to wake up  . Hypertension     takes Imdur,Coreg,and Lisinopril daily  . Atrial fibrillation     takes Coumadin daily  . Pneumonia     many,many yrs ago  . Numbness     right hand pointer and middle finger  . Carpal tunnel syndrome     right  . Joint pain   . Joint swelling   . Nocturia   . Hypothyroidism     takes SYnthroid daily  . Anxiety     Past Surgical History  Procedure Laterality Date  . Hernia repair  2009    left inguinal  . Cataract extraction      bilateral  . Colonoscopy  2007    Dr. Laural Golden- L sided diverticulosis. Next  TCS 2012 due to h/o tubular adenoma.  . Colonoscopy  02/26/2011    Procedure: COLONOSCOPY;  Surgeon: Daneil Dolin, MD;  [facility];  Service: Endoscopy;  Laterality: N/A;  11:05  . Tee without cardioversion  12/12/2011    Procedure: TRANSESOPHAGEAL ECHOCARDIOGRAM (TEE);  Surgeon: Sanda Klein, MD;  Location: [facility] END[facility];  Service: Cardiovascular;  Laterality: N/A;   successful/sinus rhythm  . Cardioversion  12/12/2011    Procedure: CARDIOVERSION;  Surgeon: Sanda Klein, MD;  Location: [place];  Service: Cardiovascular;  Laterality: N/A;  . Bunionectomy Right 03/17/2013    Procedure: Lillard Anes, Arthroplasty 2nd toe right foot;  Surgeon: Marcheta Grammes, DPM;  [facility];  Service: Orthopedics;  Laterality: Right;  . Aiken osteotomy Right 03/17/2013    Procedure: Barbie Banner OSTEOTOMY;  Surgeon: Marcheta Grammes, DPM;  [facility];  Service: Orthopedics;  Laterality: Right;  . Flexor tenotomy Left 03/17/2013    Procedure: PERCUTANEOUS FLEXOR TENOTOMY 3RD TOE LEFT FOOT;  Surgeon: Marcheta Grammes, DPM;  [facility];  Service: Orthopedics;  Laterality: Left;  . Cardiac catheterization  06/11/2007    PTCA X2 in 06/2007:  2  overlapping Cypher stents to LAD (2.5x13 and 3.0x23)  . Cardiac catheterization  06/21/2007    Cypher stent 2.5 x 13  to OM branch  . Coronary angioplasty      x 3   . Total knee arthroplasty Left 06/14/2013    Procedure: TOTAL KNEE ARTHROPLASTY;  Surgeon: Garald Balding, MD;  Location: [place];  Service: Orthopedics;  Laterality: Left;  . Rot Left     There were no vitals filed for this visit.  Visit Diagnosis:  Shoulder stiffness, left  Pain in joint, shoulder region, left  Pain in elbow joint, left      Subjective Assessment - 01/03/15 1317    Subjective  S;  I can put my left sock and shoe and tuck my shirt tail in now.   Currently in Pain? Yes   Pain Score 2    Pain Location Shoulder   Pain Orientation Left    Pain Descriptors / Indicators Aching   Pain Type Acute pain            [facility] OT Assessment - 01/03/15 0001    Assessment   Diagnosis Left Shoulder RCR, SAD, DCR   Precautions   Precautions Shoulder   Type of Shoulder Precautions PROM for 4 weeks (until 12/07/14) then progress to AAROM (until 01/04/15). Progress to AROM and progress as tolerated.                  OT Treatments/Exercises (OP) - 01/03/15 0001    Exercises   Exercises Shoulder   Shoulder Exercises: Supine   Protraction PROM;10 reps;AAROM;15 reps   Horizontal ABduction AAROM;10 reps   External Rotation PROM;10 reps;AAROM;15 reps   Internal Rotation PROM;10 reps;AAROM;15 reps   Flexion PROM;10 reps;AAROM;15 reps   ABduction PROM;10 reps;AAROM;15 reps   Other Supine Exercises serratus anterior punch A/ROM 15 times   Shoulder Exercises: Pulleys   Flexion 2 minutes   ABduction 2 minutes   Shoulder Exercises: ROM/Strengthening   Wall Wash 1'   Manual Therapy   Manual Therapy Myofascial release   Myofascial Release Myofascial release to left upper arm, trapezius, and scapularis region to decrease fascial restrictions and increase joint mobility in a pain free zone.  MFR to left elbow region to decrease elbow joint stiffness and pain.                   OT Short Term Goals - 12/12/14 1044    OT SHORT TERM GOAL #1   Title Patient will be education and independent with HEP.   Status On-going   OT SHORT TERM GOAL #2   Title Patient will increase PROM to [facility] to increase ability to get dressed with less difficulty.   Status On-going   OT SHORT TERM GOAL #3   Title Patient will increase strength to 3/5 to increase ability to complete daily tasks below shoulder height.    Status On-going   OT SHORT TERM GOAL #4   Title Patient will decrease fascial restrictions from max to min amount.   Status Achieved   OT SHORT TERM GOAL #5   Title Patient will decrease pain during daily tasks to 4/10.   Status  On-going           OT Long Term Goals - 11/20/14 1543    OT LONG TERM GOAL #1   Title Patient will return to highest level of independence with all daily tasks using LUE.   Status On-going   OT LONG TERM  GOAL #2   Title Patient will increase AROM to WNL to increase ability to complete daily tasks above shoulder height with less difficulty.    Status On-going   OT LONG TERM GOAL #3   Title Patient will increase LUE strength to 4/5 to increase ability to hold grandchildren.   Status On-going   OT LONG TERM GOAL #4   Title Patient will decrease fascial restrictions to trace amount.   Status On-going   OT LONG TERM GOAL #5   Title Patient will decrease pain level to 2/10 or less when completing daily tasks.   Status On-going               Plan - 01/03/15 1410    Clinical Impression Statement A:  Added wall wash this date and horizontal abduction to dowel rod exercises.   Plan P:  Resume missed therapeutic exercises that were missed due to time constraints - attempt functional reaching activity.   Consulted and Agree with Plan of Care Patient        Problem List Patient Active Problem List   Diagnosis Date Noted  . Arthritis of knee, left 06/16/2013  . S/P total knee replacement using cement 06/14/2013  . Osteoarthritis of left knee 03/21/2013  . BPH (benign prostatic hyperplasia)   . GERD (gastroesophageal reflux disease)   . CAD (coronary artery disease)   . Fever 03/20/2013  . Altered mental state 03/20/2013  . Postoperative fever 03/20/2013  . PNA (pneumonia) 03/20/2013  . Preop cardiovascular exam 11/24/2012  . Peroneal tendonitis 10/05/2012  . Arthritis of foot, degenerative 10/05/2012  . HTN (hypertension) 09/25/2012  . Hyperlipidemia 09/25/2012  . Atrial fibrillation 06/15/2012  . Long term (current) use of anticoagulants 06/15/2012  . Rotator cuff strain 02/04/2012  . Shoulder pain 02/04/2012  . Personal history of colonic polyps 01/29/2011  . High  risk medication use 01/29/2011    Vangie Bicker, OTR/L [phone]  01/03/2015, 2:13 PM  [place], [place], [address] Phone: [phone]   Fax:  [phone]

## 2015-01-05 ENCOUNTER — Ambulatory Visit (HOSPITAL_COMMUNITY): Payer: Medicare Other | Admitting: Occupational Therapy

## 2015-01-05 ENCOUNTER — Encounter (HOSPITAL_COMMUNITY): Payer: Self-pay | Admitting: Occupational Therapy

## 2015-01-05 DIAGNOSIS — M25522 Pain in left elbow: Secondary | ICD-10-CM

## 2015-01-05 DIAGNOSIS — M629 Disorder of muscle, unspecified: Secondary | ICD-10-CM | POA: Diagnosis not present

## 2015-01-05 DIAGNOSIS — M25512 Pain in left shoulder: Secondary | ICD-10-CM

## 2015-01-05 DIAGNOSIS — M25612 Stiffness of left shoulder, not elsewhere classified: Secondary | ICD-10-CM | POA: Diagnosis not present

## 2015-01-05 DIAGNOSIS — R29898 Other symptoms and signs involving the musculoskeletal system: Secondary | ICD-10-CM | POA: Diagnosis not present

## 2015-01-05 DIAGNOSIS — M6289 Other specified disorders of muscle: Secondary | ICD-10-CM

## 2015-01-05 NOTE — Therapy (Signed)
[place], [place], [address] Phone: [phone]   Fax:  [phone]  Occupational Therapy Treatment  Patient Details  Name: Jerry Cain MRN: [id number] Date of Birth: May 18, 1931 Referring Provider:  Garald Balding, MD  Encounter Date: 01/05/2015      OT End of Session - 01/05/15 1203    Visit Number 19   Number of Visits 36   Date for OT Re-Evaluation 01/16/15   Authorization Type Medicare part A   Authorization Time Period before 20th visit   Authorization - Visit Number 19   Authorization - Number of Visits 20   OT Start Time 0847   OT Stop Time 0931   OT Time Calculation (min) 44 min   Equipment Utilized During Treatment Patient arrived 28' late due to being out of town   Activity Tolerance Patient tolerated treatment well   Behavior During Therapy [facility] for tasks assessed/performed      Past Medical History  Diagnosis Date  . Hypercholesterolemia     takes Atorvastatin daily  . Gout   . BPH (benign prostatic hyperplasia)     takes Proscar daily  . CAD (coronary artery disease) 2009    cardiac cath and PTCA by Dr. Tami Ribas  . Tubular adenoma 2001  . Diverticulosis 2007    tcs by Dr. Laural Golden  . GERD (gastroesophageal reflux disease)   . Adenomatous colon polyp 2001  . Osteoarthritis     left knee  . Complication of anesthesia     hard to wake up  . Hypertension     takes Imdur,Coreg,and Lisinopril daily  . Atrial fibrillation     takes Coumadin daily  . Pneumonia     many,many yrs ago  . Numbness     right hand pointer and middle finger  . Carpal tunnel syndrome     right  . Joint pain   . Joint swelling   . Nocturia   . Hypothyroidism     takes SYnthroid daily  . Anxiety     Past Surgical History  Procedure Laterality Date  . Hernia repair  2009    left inguinal  . Cataract extraction      bilateral  . Colonoscopy  2007    Dr. Laural Golden- L sided diverticulosis. Next  TCS 2012 due to h/o tubular adenoma.  . Colonoscopy  02/26/2011    Procedure: COLONOSCOPY;  Surgeon: Daneil Dolin, MD;  [facility];  Service: Endoscopy;  Laterality: N/A;  11:05  . Tee without cardioversion  12/12/2011    Procedure: TRANSESOPHAGEAL ECHOCARDIOGRAM (TEE);  Surgeon: Sanda Klein, MD;  Location: [facility] ENDOSCOPY;  Service: Cardiovascular;  Laterality: N/A;   successful/sinus rhythm  . Cardioversion  12/12/2011    Procedure: CARDIOVERSION;  Surgeon: Sanda Klein, MD;  Location: [place];  Service: Cardiovascular;  Laterality: N/A;  . Bunionectomy Right 03/17/2013    Procedure: Lillard Anes, Arthroplasty 2nd toe right foot;  Surgeon: Marcheta Grammes, DPM;  Location: [facility];  Service: Orthopedics;  Laterality: Right;  . Aiken osteotomy Right 03/17/2013    Procedure: Barbie Banner OSTEOTOMY;  Surgeon: Marcheta Grammes, DPM;  Location: [facility];  Service: Orthopedics;  Laterality: Right;  . Flexor tenotomy Left 03/17/2013    Procedure: PERCUTANEOUS FLEXOR TENOTOMY 3RD TOE LEFT FOOT;  Surgeon: Marcheta Grammes, DPM;  Location: [facility];  Service: Orthopedics;  Laterality: Left;  . Cardiac catheterization  06/11/2007    PTCA X2 in 06/2007:  2  overlapping Cypher stents to LAD (2.5x13 and 3.0x23)  . Cardiac catheterization  06/21/2007    Cypher stent 2.5 x 13  to OM branch  . Coronary angioplasty      x 3   . Total knee arthroplasty Left 06/14/2013    Procedure: TOTAL KNEE ARTHROPLASTY;  Surgeon: Garald Balding, MD;  Location: [place];  Service: Orthopedics;  Laterality: Left;  . Rot Left     There were no vitals filed for this visit.  Visit Diagnosis:  Shoulder stiffness, left  Pain in joint, shoulder region, left  Pain in elbow joint, left  Shoulder weakness  Tight fascia      Subjective Assessment - 01/05/15 0849    Subjective  S: My elbow is not even hurting this morning.    Currently in Pain? No/denies            [facility] OT Assessment -  01/05/15 1202    Assessment   Diagnosis Left Shoulder RCR, SAD, DCR   Precautions   Precautions Shoulder   Type of Shoulder Precautions PROM for 4 weeks (until 12/07/14) then progress to AAROM (until 01/04/15). Progress to AROM and progress as tolerated.                  OT Treatments/Exercises (OP) - 01/05/15 0912    Exercises   Exercises Shoulder   Shoulder Exercises: Supine   Protraction PROM;10 reps;AAROM;15 reps   Horizontal ABduction PROM;5 reps;AAROM;10 reps   External Rotation PROM;10 reps;AAROM;15 reps   Internal Rotation PROM;10 reps;AAROM;15 reps   Flexion PROM;10 reps;AAROM;15 reps   ABduction PROM;10 reps;AAROM;15 reps   Other Supine Exercises serratus anterior punch A/ROM 15 times   Shoulder Exercises: Seated   Protraction AAROM;10 reps   External Rotation AAROM;10 reps   Internal Rotation AAROM;10 reps   Flexion AAROM;10 reps   Shoulder Exercises: ROM/Strengthening   Wall Wash 1'   Thumb Tacks 1'   Manual Therapy   Manual Therapy Myofascial release   Myofascial Release Myofascial release to left upper arm, trapezius, and scapularis region to decrease fascial restrictions and increase joint mobility in a pain free zone.  MFR to left elbow region to decrease elbow joint stiffness and pain.                   OT Short Term Goals - 12/12/14 1044    OT SHORT TERM GOAL #1   Title Patient will be education and independent with HEP.   Status On-going   OT SHORT TERM GOAL #2   Title Patient will increase PROM to [facility] to increase ability to get dressed with less difficulty.   Status On-going   OT SHORT TERM GOAL #3   Title Patient will increase strength to 3/5 to increase ability to complete daily tasks below shoulder height.    Status On-going   OT SHORT TERM GOAL #4   Title Patient will decrease fascial restrictions from max to min amount.   Status Achieved   OT SHORT TERM GOAL #5   Title Patient will decrease pain during daily tasks to 4/10.    Status On-going           OT Long Term Goals - 11/20/14 1543    OT LONG TERM GOAL #1   Title Patient will return to highest level of independence with all daily tasks using LUE.   Status On-going   OT LONG TERM GOAL #2   Title Patient will increase AROM to WNL to  increase ability to complete daily tasks above shoulder height with less difficulty.    Status On-going   OT LONG TERM GOAL #3   Title Patient will increase LUE strength to 4/5 to increase ability to hold grandchildren.   Status On-going   OT LONG TERM GOAL #4   Title Patient will decrease fascial restrictions to trace amount.   Status On-going   OT LONG TERM GOAL #5   Title Patient will decrease pain level to 2/10 or less when completing daily tasks.   Status On-going               Plan - 01/05/15 1204    Clinical Impression Statement A: Completed AAROM exercises in supine and sitting, pt report less discomfort in elbow this session. Resumed wall wash, thumb tacks, did not complete functional reaching activity due to time constraints.    Plan P: Resume theraband, add functional reaching activity.         Problem List Patient Active Problem List   Diagnosis Date Noted  . Arthritis of knee, left 06/16/2013  . S/P total knee replacement using cement 06/14/2013  . Osteoarthritis of left knee 03/21/2013  . BPH (benign prostatic hyperplasia)   . GERD (gastroesophageal reflux disease)   . CAD (coronary artery disease)   . Fever 03/20/2013  . Altered mental state 03/20/2013  . Postoperative fever 03/20/2013  . PNA (pneumonia) 03/20/2013  . Preop cardiovascular exam 11/24/2012  . Peroneal tendonitis 10/05/2012  . Arthritis of foot, degenerative 10/05/2012  . HTN (hypertension) 09/25/2012  . Hyperlipidemia 09/25/2012  . Atrial fibrillation 06/15/2012  . Long term (current) use of anticoagulants 06/15/2012  . Rotator cuff strain 02/04/2012  . Shoulder pain 02/04/2012  . Personal history of colonic polyps  01/29/2011  . High risk medication use 01/29/2011    Guadelupe Sabin, OTR/L  [phone]  01/05/2015, 12:05 PM  [facility] [address] [place], [place], [address] Phone: [phone]   Fax:  [phone]

## 2015-01-08 ENCOUNTER — Ambulatory Visit (HOSPITAL_COMMUNITY): Payer: Medicare Other | Attending: Orthopaedic Surgery | Admitting: Specialist

## 2015-01-08 DIAGNOSIS — M629 Disorder of muscle, unspecified: Secondary | ICD-10-CM | POA: Diagnosis not present

## 2015-01-08 DIAGNOSIS — R29898 Other symptoms and signs involving the musculoskeletal system: Secondary | ICD-10-CM | POA: Diagnosis not present

## 2015-01-08 DIAGNOSIS — M25522 Pain in left elbow: Secondary | ICD-10-CM | POA: Diagnosis not present

## 2015-01-08 DIAGNOSIS — M25612 Stiffness of left shoulder, not elsewhere classified: Secondary | ICD-10-CM

## 2015-01-08 DIAGNOSIS — M25512 Pain in left shoulder: Secondary | ICD-10-CM

## 2015-01-08 NOTE — Therapy (Addendum)
[place], [place], [address] Phone: [phone]   Fax:  [phone]  Occupational Therapy Treatment  Patient Details  Name: Jerry Cain MRN: [id number] Date of Birth: [birth year] Referring Provider:  Garald Balding, MD  Encounter Date: 01/08/2015      OT End of Session - 01/08/15 0951    Visit Number 20   Number of Visits 36   Date for OT Re-Evaluation 01/16/15   Authorization Type Medicare   Authorization Time Period before 30th visit   Authorization - Visit Number 79   Authorization - Number of Visits 30   OT Start Time 0933   OT Stop Time 1015   OT Time Calculation (min) 42 min   Activity Tolerance Patient tolerated treatment well   Behavior During Therapy [facility] for tasks assessed/performed      Past Medical History  Diagnosis Date  . Hypercholesterolemia     takes Atorvastatin daily  . Gout   . BPH (benign prostatic hyperplasia)     takes Proscar daily  . CAD (coronary artery disease) 2009    cardiac cath and PTCA by Dr. Tami Ribas  . Tubular adenoma 2001  . Diverticulosis 2007    tcs by Dr. Laural Golden  . GERD (gastroesophageal reflux disease)   . Adenomatous colon polyp 2001  . Osteoarthritis     left knee  . Complication of anesthesia     hard to wake up  . Hypertension     takes Imdur,Coreg,and Lisinopril daily  . Atrial fibrillation     takes Coumadin daily  . Pneumonia     many,many yrs ago  . Numbness     right hand pointer and middle finger  . Carpal tunnel syndrome     right  . Joint pain   . Joint swelling   . Nocturia   . Hypothyroidism     takes SYnthroid daily  . Anxiety     Past Surgical History  Procedure Laterality Date  . Hernia repair  2009    left inguinal  . Cataract extraction      bilateral  . Colonoscopy  2007    Dr. Laural Golden- L sided diverticulosis. Next TCS 2012 due to h/o tubular adenoma.  . Colonoscopy  02/26/2011    Procedure: COLONOSCOPY;   Surgeon: Daneil Dolin, MD;  [facility];  Service: Endoscopy;  Laterality: N/A;  11:05  . Tee without cardioversion  12/12/2011    Procedure: TRANSESOPHAGEAL ECHOCARDIOGRAM (TEE);  Surgeon: Sanda Klein, MD;  Location[facility] END[facility]COPY;  Service: Cardiovascular;  Laterality: N/A;   successful/sinus rhythm  . Cardioversion  12/12/2011    Procedure: CARDIOVERSION;  Surgeon: Sanda Klein, MD;  Location: [place];  Service: Cardiovascular;  Laterality: N/A;  . Bunionectomy Right 03/17/2013    Procedure: Lillard Anes, Arthroplasty 2nd toe right foot;  Surgeon: Marcheta Grammes, DPM;  [facility];  Service: Orthopedics;  Laterality: Right;  . Aiken osteotomy Right 03/17/2013    Procedure: Barbie Banner OSTEOTOMY;  Surgeon: Marcheta Grammes, DPM;  [facility];  Service: Orthopedics;  Laterality: Right;  . Flexor tenotomy Left 03/17/2013    Procedure: PERCUTANEOUS FLEXOR TENOTOMY 3RD TOE LEFT FOOT;  Surgeon: Marcheta Grammes, DPM;  [facility];  Service: Orthopedics;  Laterality: Left;  . Cardiac catheterization  06/11/2007    PTCA X2 in 06/2007:  2 overlapping Cypher stents to LAD (2.5x13 and 3.0x23)  . Cardiac catheterization  06/21/2007    Cypher  stent 2.5 x 13  to OM branch  . Coronary angioplasty      x 3   . Total knee arthroplasty Left 06/14/2013    Procedure: TOTAL KNEE ARTHROPLASTY;  Surgeon: Garald Balding, MD;  Location: [place];  Service: Orthopedics;  Laterality: Left;  . Rot Left     There were no vitals filed for this visit.  Visit Diagnosis:  Shoulder stiffness, left  Pain in joint, shoulder region, left  Pain in elbow joint, left  Shoulder weakness      Subjective Assessment - 01/08/15 0934    Subjective  S:  I have a weird pain under my scar.   Currently in Pain? Yes   Pain Score 3    Pain Location Shoulder   Pain Orientation Left   Pain Descriptors / Indicators Aching   Pain Type Acute pain            [facility] OT  Assessment - 01/08/15 0001    Assessment   Diagnosis Left Shoulder RCR, SAD, DCR   Precautions   Precautions Shoulder   Type of Shoulder Precautions PROM for 4 weeks (until 12/07/14) then progress to AAROM (until 01/04/15). Progress to AROM and progress as tolerated.   Observation/Other Assessments   Focus on Therapeutic Outcomes (FOTO)  63%                  OT Treatments/Exercises (OP) - 01/08/15 0001    Exercises   Exercises Shoulder   Shoulder Exercises: Supine   Protraction PROM;10 reps;AAROM;15 reps   Horizontal ABduction PROM;5 reps;AROM;10 reps   External Rotation PROM;5 reps;AROM;10 reps   Internal Rotation PROM;5 reps;AROM;10 reps   Flexion PROM;5 reps;AROM;10 reps   ABduction PROM;5 reps;AROM;10 reps   Shoulder Exercises: Seated   Protraction AAROM;10 reps   External Rotation AAROM;10 reps   Internal Rotation AAROM;10 reps   Flexion AAROM;10 reps   Abduction AAROM;10 reps   Shoulder Exercises: Standing   External Rotation Theraband;10 reps   Theraband Level (Shoulder External Rotation) Level 2 (Red)   Internal Rotation Weights;15 reps   Theraband Level (Shoulder Internal Rotation) Level 2 (Red)   Extension Theraband;15 reps   Theraband Level (Shoulder Extension) Level 2 (Red)   Row Theraband;15 reps   Theraband Level (Shoulder Row) Level 2 (Red)   Shoulder Exercises: Therapy Ball   Right/Left 5 reps   Shoulder Exercises: ROM/Strengthening   Wall Wash 2'   Manual Therapy   Manual Therapy Myofascial release   Myofascial Release Myofascial release to left upper arm, trapezius, and scapularis region to decrease fascial restrictions and increase joint mobility in a pain free zone.  MFR to left elbow region to decrease elbow joint stiffness and pain.                   OT Short Term Goals - 12/12/14 1044    OT SHORT TERM GOAL #1   Title Patient will be education and independent with HEP.   Status On-going   OT SHORT TERM GOAL #2   Title Patient  will increase PROM to [facility] to increase ability to get dressed with less difficulty.   Status On-going   OT SHORT TERM GOAL #3   Title Patient will increase strength to 3/5 to increase ability to complete daily tasks below shoulder height.    Status On-going   OT SHORT TERM GOAL #4   Title Patient will decrease fascial restrictions from max to min amount.   Status Achieved  OT SHORT TERM GOAL #5   Title Patient will decrease pain during daily tasks to 4/10.   Status On-going           OT Long Term Goals - 11/20/14 1543    OT LONG TERM GOAL #1   Title Patient will return to highest level of independence with all daily tasks using LUE.   Status On-going   OT LONG TERM GOAL #2   Title Patient will increase AROM to WNL to increase ability to complete daily tasks above shoulder height with less difficulty.    Status On-going   OT LONG TERM GOAL #3   Title Patient will increase LUE strength to 4/5 to increase ability to hold grandchildren.   Status On-going   OT LONG TERM GOAL #4   Title Patient will decrease fascial restrictions to trace amount.   Status On-going   OT LONG TERM GOAL #5   Title Patient will decrease pain level to 2/10 or less when completing daily tasks.   Status On-going               Plan - 02-04-2015 1042    Clinical Impression Statement A: Added circles with therapy ball to improve reaching behind back.     Plan P:  Add functional reaching activity.  Increase repetitions with supine AROM   Consulted and Agree with Plan of Care Patient          G-Codes - 04-Feb-2015 1044    Functional Assessment Tool Used FOTO scored 63% independent and 37% impaired    Functional Limitation Carrying, moving and handling objects   Carrying, Moving and Handling Objects Current Status (T0569) At least 20 percent but less than 40 percent impaired, limited or restricted   Carrying, Moving and Handling Objects Goal Status (V9480) At least 1 percent but less than 20 percent  impaired, limited or restricted      Problem List Patient Active Problem List   Diagnosis Date Noted  . Arthritis of knee, left 06/16/2013  . S/P total knee replacement using cement 06/14/2013  . Osteoarthritis of left knee 03/21/2013  . BPH (benign prostatic hyperplasia)   . GERD (gastroesophageal reflux disease)   . CAD (coronary artery disease)   . Fever 03/20/2013  . Altered mental state 03/20/2013  . Postoperative fever 03/20/2013  . PNA (pneumonia) 03/20/2013  . Preop cardiovascular exam 11/24/2012  . Peroneal tendonitis 10/05/2012  . Arthritis of foot, degenerative 10/05/2012  . HTN (hypertension) 09/25/2012  . Hyperlipidemia 09/25/2012  . Atrial fibrillation ([place]) 06/15/2012  . Long term (current) use of anticoagulants 06/15/2012  . Rotator cuff strain 02/04/2012  . Shoulder pain 02/04/2012  . Personal history of colonic polyps 01/29/2011  . High risk medication use 01/29/2011    Vangie Bicker, OTR/L [phone]  04-Feb-2015, 10:45 AM  [place] [address] [place], [place], [address] Phone: [phone]   Fax:  [phone]  Occupational Therapy Progress Note  Dates of Reporting Period: 12/11/14 to 02-04-15 Objective Reports of Subjective Statement: increased ability to reach behind back and overhead  Objective Measurements:  Assessment    Diagnosis Left Shoulder RCR, SAD, DCR   Precautions   Precautions Shoulder   Type of Shoulder Precautions PROM for 4 weeks (until 12/07/14) then progress to AAROM (until 01/04/15). Progress to AROM and progress as tolerated.   PROM   Left Shoulder Flexion 120 Degrees  95   Left Shoulder ABduction 100 Degrees  90  Left Shoulder Internal Rotation 90 Degrees  90   Left Shoulder External Rotation 63 Degrees  73   Left Elbow Flexion 126  120   Left Elbow Extension -25  -16                Goal Update: patient has met all short  term goals and is progressing towards long term goals.  Plan:  Continue skilled OT intervention to improve A/ROM and strength needed to return to prior level of independence with daily tasks.  Reason Skilled Services are Required: Patient has limited A/ROM and strength, and P/ROM.  Patient requires skilled OT intervention to improve functional A/ROM and strength to return to prior level of independence with daily tasks.  Vangie Bicker, OTR/L [phone]

## 2015-01-10 ENCOUNTER — Encounter (HOSPITAL_COMMUNITY): Payer: Self-pay | Admitting: Occupational Therapy

## 2015-01-10 ENCOUNTER — Ambulatory Visit (HOSPITAL_COMMUNITY): Payer: Medicare Other | Admitting: Occupational Therapy

## 2015-01-10 ENCOUNTER — Ambulatory Visit (INDEPENDENT_AMBULATORY_CARE_PROVIDER_SITE_OTHER): Payer: Medicare Other | Admitting: *Deleted

## 2015-01-10 DIAGNOSIS — M629 Disorder of muscle, unspecified: Secondary | ICD-10-CM

## 2015-01-10 DIAGNOSIS — R29898 Other symptoms and signs involving the musculoskeletal system: Secondary | ICD-10-CM | POA: Diagnosis not present

## 2015-01-10 DIAGNOSIS — M25522 Pain in left elbow: Secondary | ICD-10-CM | POA: Diagnosis not present

## 2015-01-10 DIAGNOSIS — M25512 Pain in left shoulder: Secondary | ICD-10-CM | POA: Diagnosis not present

## 2015-01-10 DIAGNOSIS — M25612 Stiffness of left shoulder, not elsewhere classified: Secondary | ICD-10-CM | POA: Diagnosis not present

## 2015-01-10 DIAGNOSIS — I4891 Unspecified atrial fibrillation: Secondary | ICD-10-CM | POA: Diagnosis not present

## 2015-01-10 DIAGNOSIS — Z7901 Long term (current) use of anticoagulants: Secondary | ICD-10-CM

## 2015-01-10 DIAGNOSIS — M6289 Other specified disorders of muscle: Secondary | ICD-10-CM

## 2015-01-10 LAB — POCT INR: INR: 2.8

## 2015-01-10 NOTE — Therapy (Signed)
[place], [place], [address] Phone: [phone]   Fax:  [phone]  Occupational Therapy Treatment  Patient Details  Name: Jerry Cain MRN: [id number] Date of Birth: Nov 06, 1931 Referring Provider:  Garald Balding, MD  Encounter Date: 01/10/2015      OT End of Session - 01/10/15 1200    Visit Number 21   Number of Visits 36   Date for OT Re-Evaluation 01/16/15   Authorization Type Medicare   Authorization Time Period before 30th visit   Authorization - Visit Number 21   Authorization - Number of Visits 30   OT Start Time 1019   OT Stop Time 1100   OT Time Calculation (min) 41 min   Activity Tolerance Patient tolerated treatment well   Behavior During Therapy [facility] for tasks assessed/performed      Past Medical History  Diagnosis Date  . Hypercholesterolemia     takes Atorvastatin daily  . Gout   . BPH (benign prostatic hyperplasia)     takes Proscar daily  . CAD (coronary artery disease) 2009    cardiac cath and PTCA by Dr. Tami Ribas  . Tubular adenoma 2001  . Diverticulosis 2007    tcs by Dr. Laural Golden  . GERD (gastroesophageal reflux disease)   . Adenomatous colon polyp 2001  . Osteoarthritis     left knee  . Complication of anesthesia     hard to wake up  . Hypertension     takes Imdur,Coreg,and Lisinopril daily  . Atrial fibrillation ([place])     takes Coumadin daily  . Pneumonia     many,many yrs ago  . Numbness     right hand pointer and middle finger  . Carpal tunnel syndrome     right  . Joint pain   . Joint swelling   . Nocturia   . Hypothyroidism     takes SYnthroid daily  . Anxiety     Past Surgical History  Procedure Laterality Date  . Hernia repair  2009    left inguinal  . Cataract extraction      bilateral  . Colonoscopy  2007    Dr. Laural Golden- L sided diverticulosis. Next TCS 2012 due to h/o tubular adenoma.  . Colonoscopy  02/26/2011    Procedure: COLONOSCOPY;   Surgeon: Daneil Dolin, MD;  [facility];  Service: Endoscopy;  Laterality: N/A;  11:05  . Tee without cardioversion  12/12/2011    Procedure: TRANSESOPHAGEAL ECHOCARDIOGRAM (TEE);  Surgeon: Sanda Klein, MD;  Location: [facility] ENDOSCOPY;  Service: Cardiovascular;  Laterality: N/A;   successful/sinus rhythm  . Cardioversion  12/12/2011    Procedure: CARDIOVERSION;  Surgeon: Sanda Klein, MD;  Location: [place];  Service: Cardiovascular;  Laterality: N/A;  . Bunionectomy Right 03/17/2013    Procedure: Lillard Anes, Arthroplasty 2nd toe right foot;  Surgeon: Marcheta Grammes, DPM;  Location: [facility];  Service: Orthopedics;  Laterality: Right;  . Aiken osteotomy Right 03/17/2013    Procedure: Barbie Banner OSTEOTOMY;  Surgeon: Marcheta Grammes, DPM;  Location: [facility];  Service: Orthopedics;  Laterality: Right;  . Flexor tenotomy Left 03/17/2013    Procedure: PERCUTANEOUS FLEXOR TENOTOMY 3RD TOE LEFT FOOT;  Surgeon: Marcheta Grammes, DPM;  Location: [facility];  Service: Orthopedics;  Laterality: Left;  . Cardiac catheterization  06/11/2007    PTCA X2 in 06/2007:  2 overlapping Cypher stents to LAD (2.5x13 and 3.0x23)  . Cardiac catheterization  06/21/2007  Cypher stent 2.5 x 13  to OM branch  . Coronary angioplasty      x 3   . Total knee arthroplasty Left 06/14/2013    Procedure: TOTAL KNEE ARTHROPLASTY;  Surgeon: Garald Balding, MD;  Location: [place];  Service: Orthopedics;  Laterality: Left;  . Rot Left     There were no vitals filed for this visit.  Visit Diagnosis:  Shoulder stiffness, left  Pain in joint, shoulder region, left  Pain in elbow joint, left  Shoulder weakness  Tight fascia      Subjective Assessment - 01/10/15 1021    Subjective  S: I'm using this arm so much it just comes natural.    Currently in Pain? No/denies            [facility] OT Assessment - 01/10/15 1157    Assessment   Diagnosis Left Shoulder RCR, SAD, DCR   Precautions    Precautions Shoulder   Type of Shoulder Precautions PROM for 4 weeks (until 12/07/14) then progress to AAROM (until 01/04/15). Progress to AROM and progress as tolerated.                  OT Treatments/Exercises (OP) - 01/10/15 1021    Exercises   Exercises Shoulder   Shoulder Exercises: Supine   Protraction PROM;AROM;10 reps   Horizontal ABduction PROM;5 reps;AAROM;15 reps   External Rotation PROM;5 reps;AROM;10 reps   Internal Rotation PROM;5 reps;AROM;10 reps   Flexion PROM;5 reps;AAROM;15 reps   ABduction PROM;5 reps;AAROM;15 reps   Shoulder Exercises: Standing   External Rotation Theraband;15 reps   Theraband Level (Shoulder External Rotation) Level 2 (Red)   Internal Rotation Theraband;15 reps   Theraband Level (Shoulder Internal Rotation) Level 2 (Red)   Extension Theraband;15 reps   Theraband Level (Shoulder Extension) Level 2 (Red)   Row Theraband;15 reps   Theraband Level (Shoulder Row) Level 2 (Red)   Functional Reaching Activities   Mid Level Pt completed activity playing resistive clothespins onto vertical rod, in sitting, from chest to overhead level. Pt was able to place clothespins approximately 3/4 of the height of the pole. Pt was surprised at how far over his head he could reach.    Manual Therapy   Manual Therapy Myofascial release   Myofascial Release Myofascial release to left upper arm, trapezius, and scapularis region to decrease fascial restrictions and increase joint mobility in a pain free zone.  MFR to left elbow region to decrease elbow joint stiffness and pain.                   OT Short Term Goals - 12/12/14 1044    OT SHORT TERM GOAL #1   Title Patient will be education and independent with HEP.   Status On-going   OT SHORT TERM GOAL #2   Title Patient will increase PROM to [facility] to increase ability to get dressed with less difficulty.   Status On-going   OT SHORT TERM GOAL #3   Title Patient will increase strength to 3/5 to  increase ability to complete daily tasks below shoulder height.    Status On-going   OT SHORT TERM GOAL #4   Title Patient will decrease fascial restrictions from max to min amount.   Status Achieved   OT SHORT TERM GOAL #5   Title Patient will decrease pain during daily tasks to 4/10.   Status On-going           OT Long Term Goals -  11/20/14 1543    OT LONG TERM GOAL #1   Title Patient will return to highest level of independence with all daily tasks using LUE.   Status On-going   OT LONG TERM GOAL #2   Title Patient will increase AROM to WNL to increase ability to complete daily tasks above shoulder height with less difficulty.    Status On-going   OT LONG TERM GOAL #3   Title Patient will increase LUE strength to 4/5 to increase ability to hold grandchildren.   Status On-going   OT LONG TERM GOAL #4   Title Patient will decrease fascial restrictions to trace amount.   Status On-going   OT LONG TERM GOAL #5   Title Patient will decrease pain level to 2/10 or less when completing daily tasks.   Status On-going               Plan - 01/10/15 1200    Clinical Impression Statement A: Added functional reaching task, began transitioning from AAROM to AROM in supine. Pt able to complete protraction, ER/IR AROM. Pt reports he is now able to tuck his shirt in all the way around his waistband and can hold a coffee cup with his left hand.    Plan P: Resume seated AAROM exercises, continue to work on increasing PROM to [facility].         Problem List Patient Active Problem List   Diagnosis Date Noted  . Arthritis of knee, left 06/16/2013  . S/P total knee replacement using cement 06/14/2013  . Osteoarthritis of left knee 03/21/2013  . BPH (benign prostatic hyperplasia)   . GERD (gastroesophageal reflux disease)   . CAD (coronary artery disease)   . Fever 03/20/2013  . Altered mental state 03/20/2013  . Postoperative fever 03/20/2013  . PNA (pneumonia) 03/20/2013  . Preop  cardiovascular exam 11/24/2012  . Peroneal tendonitis 10/05/2012  . Arthritis of foot, degenerative 10/05/2012  . HTN (hypertension) 09/25/2012  . Hyperlipidemia 09/25/2012  . Atrial fibrillation ([place]) 06/15/2012  . Long term (current) use of anticoagulants 06/15/2012  . Rotator cuff strain 02/04/2012  . Shoulder pain 02/04/2012  . Personal history of colonic polyps 01/29/2011  . High risk medication use 01/29/2011    Guadelupe Sabin, OTR/L  [phone]  01/10/2015, 12:03 PM  [place] [address] [place], [place], [address] Phone: [phone]   Fax:  [phone]

## 2015-01-12 ENCOUNTER — Ambulatory Visit (HOSPITAL_COMMUNITY): Payer: Medicare Other

## 2015-01-12 ENCOUNTER — Encounter (HOSPITAL_COMMUNITY): Payer: Self-pay

## 2015-01-12 DIAGNOSIS — M6289 Other specified disorders of muscle: Secondary | ICD-10-CM

## 2015-01-12 DIAGNOSIS — R29898 Other symptoms and signs involving the musculoskeletal system: Secondary | ICD-10-CM | POA: Diagnosis not present

## 2015-01-12 DIAGNOSIS — M629 Disorder of muscle, unspecified: Secondary | ICD-10-CM | POA: Diagnosis not present

## 2015-01-12 DIAGNOSIS — M25512 Pain in left shoulder: Secondary | ICD-10-CM | POA: Diagnosis not present

## 2015-01-12 DIAGNOSIS — M25612 Stiffness of left shoulder, not elsewhere classified: Secondary | ICD-10-CM | POA: Diagnosis not present

## 2015-01-12 DIAGNOSIS — M25522 Pain in left elbow: Secondary | ICD-10-CM | POA: Diagnosis not present

## 2015-01-12 NOTE — Therapy (Addendum)
[place], [place], [address] Phone: [phone]   Fax:  [phone]  Occupational Therapy Treatment  Patient Details  Name: Jerry Cain MRN: [id number] Date of Birth: 1931/08/20 Referring Provider:  Garald Balding, MD  Encounter Date: 01/12/2015      OT End of Session - 01/12/15 1048    Visit Number 22   Number of Visits 36   Date for OT Re-Evaluation 01/16/15   Authorization Type Medicare   Authorization Time Period before 30th visit   Authorization - Visit Number 15   Authorization - Number of Visits 30   OT Start Time 0845   OT Stop Time 0930   OT Time Calculation (min) 45 min   Activity Tolerance Patient tolerated treatment well   Behavior During Therapy [facility] for tasks assessed/performed      Past Medical History  Diagnosis Date  . Hypercholesterolemia     takes Atorvastatin daily  . Gout   . BPH (benign prostatic hyperplasia)     takes Proscar daily  . CAD (coronary artery disease) 2009    cardiac cath and PTCA by Dr. Tami Ribas  . Tubular adenoma 2001  . Diverticulosis 2007    tcs by Dr. Laural Golden  . GERD (gastroesophageal reflux disease)   . Adenomatous colon polyp 2001  . Osteoarthritis     left knee  . Complication of anesthesia     hard to wake up  . Hypertension     takes Imdur,Coreg,and Lisinopril daily  . Atrial fibrillation ([place])     takes Coumadin daily  . Pneumonia     many,many yrs ago  . Numbness     right hand pointer and middle finger  . Carpal tunnel syndrome     right  . Joint pain   . Joint swelling   . Nocturia   . Hypothyroidism     takes SYnthroid daily  . Anxiety     Past Surgical History  Procedure Laterality Date  . Hernia repair  2009    left inguinal  . Cataract extraction      bilateral  . Colonoscopy  2007    Dr. Laural Golden- L sided diverticulosis. Next TCS 2012 due to h/o tubular adenoma.  . Colonoscopy  02/26/2011    Procedure: COLONOSCOPY;   Surgeon: Daneil Dolin, MD;  [facility];  Service: Endoscopy;  Laterality: N/A;  11:05  . Tee without cardioversion  12/12/2011    Procedure: TRANSESOPHAGEAL ECHOCARDIOGRAM (TEE);  Surgeon: Sanda Klein, MD;  Location: [facility] END[facility];  Service: Cardiovascular;  Laterality: N/A;   successful/sinus rhythm  . Cardioversion  12/12/2011    Procedure: CARDIOVERSION;  Surgeon: Sanda Klein, MD;  Location: [place];  Service: Cardiovascular;  Laterality: N/A;  . Bunionectomy Right 03/17/2013    Procedure: Lillard Anes, Arthroplasty 2nd toe right foot;  Surgeon: Marcheta Grammes, DPM;  [facility];  Service: Orthopedics;  Laterality: Right;  . Aiken osteotomy Right 03/17/2013    Procedure: Barbie Banner OSTEOTOMY;  Surgeon: Marcheta Grammes, DPM;  [facility];  Service: Orthopedics;  Laterality: Right;  . Flexor tenotomy Left 03/17/2013    Procedure: PERCUTANEOUS FLEXOR TENOTOMY 3RD TOE LEFT FOOT;  Surgeon: Marcheta Grammes, DPM;  [facility];  Service: Orthopedics;  Laterality: Left;  . Cardiac catheterization  06/11/2007    PTCA X2 in 06/2007:  2 overlapping Cypher stents to LAD (2.5x13 and 3.0x23)  . Cardiac catheterization  06/21/2007  Cypher stent 2.5 x 13  to OM branch  . Coronary angioplasty      x 3   . Total knee arthroplasty Left 06/14/2013    Procedure: TOTAL KNEE ARTHROPLASTY;  Surgeon: Garald Balding, MD;  Location: [place];  Service: Orthopedics;  Laterality: Left;  . Rot Left     There were no vitals filed for this visit.  Visit Diagnosis:  Shoulder stiffness, left  Shoulder weakness  Tight fascia                    OT Treatments/Exercises (OP) - 01/12/15 0913    Exercises   Exercises Shoulder   Shoulder Exercises: Supine   Protraction PROM;AROM;10 reps   Horizontal ABduction PROM;5 reps;AROM;10 reps   External Rotation PROM;5 reps;AROM;10 reps   Internal Rotation PROM;5 reps;AROM;10 reps   Flexion PROM;5  reps;AROM;10 reps   ABduction PROM;5 reps;AROM;10 reps   Shoulder Exercises: Seated   Protraction AAROM;10 reps   Horizontal ABduction AAROM;10 reps   External Rotation AAROM;10 reps   Internal Rotation AAROM;10 reps   Flexion AAROM;10 reps   Abduction AAROM;10 reps   Shoulder Exercises: Standing   Extension Theraband;15 reps   Theraband Level (Shoulder Extension) Level 2 (Red)   Row Theraband;15 reps   Theraband Level (Shoulder Row) Level 2 (Red)   Retraction Theraband;15 reps   Theraband Level (Shoulder Retraction) Level 2 (Red)   Shoulder Exercises: ROM/Strengthening   Proximal Shoulder Strengthening, Supine 10X no rest breaks   Manual Therapy   Manual Therapy Myofascial release;Muscle Energy Technique   Myofascial Release Myofascial release to left upper arm, trapezius, and scapularis region to decrease fascial restrictions and increase joint mobility in a pain free zone.  MFR to left elbow region to decrease elbow joint stiffness and pain.    Muscle Energy Technique Muscle Energy technique completed to left anterior and medial deltoid to decrease muscle spasm and increase ROM.                   OT Short Term Goals - 12/12/14 1044    OT SHORT TERM GOAL #1   Title Patient will be education and independent with HEP.   Status On-going   OT SHORT TERM GOAL #2   Title Patient will increase PROM to [facility] to increase ability to get dressed with less difficulty.   Status On-going   OT SHORT TERM GOAL #3   Title Patient will increase strength to 3/5 to increase ability to complete daily tasks below shoulder height.    Status On-going   OT SHORT TERM GOAL #4   Title Patient will decrease fascial restrictions from max to min amount.   Status Achieved   OT SHORT TERM GOAL #5   Title Patient will decrease pain during daily tasks to 4/10.   Status On-going           OT Long Term Goals - 11/20/14 1543    OT LONG TERM GOAL #1   Title Patient will return to highest level  of independence with all daily tasks using LUE.   Status On-going   OT LONG TERM GOAL #2   Title Patient will increase AROM to WNL to increase ability to complete daily tasks above shoulder height with less difficulty.    Status On-going   OT LONG TERM GOAL #3   Title Patient will increase LUE strength to 4/5 to increase ability to hold grandchildren.   Status On-going   OT LONG TERM  GOAL #4   Title Patient will decrease fascial restrictions to trace amount.   Status On-going   OT LONG TERM GOAL #5   Title Patient will decrease pain level to 2/10 or less when completing daily tasks.   Status On-going             01/12/15 1048  OT Assessment and Plan  Clinical Impression Statement A: Completed all supine exercises A/ROM. Resumed seated AA/ROM exercises. Added proximal shoulder strengthening supine. Pt with increase trigger points and muscle tightness this session which caused increased pain during passive stretching. Therapist was able to alternate between manual therapy techniques and passive stretching with great results and increased comfort level for the patient.   Plan P: Complete proximal shoulder strengthening seated. Continue to work on increasing ROM during passive stretching in a pain free zone.    Problem List Patient Active Problem List   Diagnosis Date Noted  . Arthritis of knee, left 06/16/2013  . S/P total knee replacement using cement 06/14/2013  . Osteoarthritis of left knee 03/21/2013  . BPH (benign prostatic hyperplasia)   . GERD (gastroesophageal reflux disease)   . CAD (coronary artery disease)   . Fever 03/20/2013  . Altered mental state 03/20/2013  . Postoperative fever 03/20/2013  . PNA (pneumonia) 03/20/2013  . Preop cardiovascular exam 11/24/2012  . Peroneal tendonitis 10/05/2012  . Arthritis of foot, degenerative 10/05/2012  . HTN (hypertension) 09/25/2012  . Hyperlipidemia 09/25/2012  . Atrial fibrillation ([place]) 06/15/2012  . Long term  (current) use of anticoagulants 06/15/2012  . Rotator cuff strain 02/04/2012  . Shoulder pain 02/04/2012  . Personal history of colonic polyps 01/29/2011  . High risk medication use 01/29/2011    Ailene Ravel, OTR/L,CBIS  [phone]  01/12/2015, 10:51 AM  [place], [place], [address] Phone: [phone]   Fax:  [phone]

## 2015-01-15 ENCOUNTER — Ambulatory Visit (HOSPITAL_COMMUNITY): Payer: Medicare Other | Admitting: Specialist

## 2015-01-15 DIAGNOSIS — R29898 Other symptoms and signs involving the musculoskeletal system: Secondary | ICD-10-CM

## 2015-01-15 DIAGNOSIS — M629 Disorder of muscle, unspecified: Secondary | ICD-10-CM

## 2015-01-15 DIAGNOSIS — M6289 Other specified disorders of muscle: Secondary | ICD-10-CM

## 2015-01-15 DIAGNOSIS — M25512 Pain in left shoulder: Secondary | ICD-10-CM | POA: Diagnosis not present

## 2015-01-15 DIAGNOSIS — M25522 Pain in left elbow: Secondary | ICD-10-CM | POA: Diagnosis not present

## 2015-01-15 DIAGNOSIS — M25612 Stiffness of left shoulder, not elsewhere classified: Secondary | ICD-10-CM | POA: Diagnosis not present

## 2015-01-15 NOTE — Therapy (Signed)
[place], [place], [address] Phone: [phone]   Fax:  [phone]  Occupational Therapy Treatment  Patient Details  Name: Jerry Cain MRN: [id number] Date of Birth: [birth year] Referring Provider:  Garald Balding, MD  Encounter Date: 01/15/2015      OT End of Session - 01/15/15 1008    Visit Number 23   Number of Visits 36   Date for OT Re-Evaluation 01/16/15   Authorization Type Medicare   Authorization Time Period before 30th visit   Authorization - Visit Number 23   Authorization - Number of Visits 30   OT Start Time 0850   OT Stop Time 0934   OT Time Calculation (min) 44 min   Activity Tolerance Patient tolerated treatment well   Behavior During Therapy [facility] for tasks assessed/performed      Past Medical History  Diagnosis Date  . Hypercholesterolemia     takes Atorvastatin daily  . Gout   . BPH (benign prostatic hyperplasia)     takes Proscar daily  . CAD (coronary artery disease) 2009    cardiac cath and PTCA by Dr. Tami Ribas  . Tubular adenoma 2001  . Diverticulosis 2007    tcs by Dr. Laural Golden  . GERD (gastroesophageal reflux disease)   . Adenomatous colon polyp 2001  . Osteoarthritis     left knee  . Complication of anesthesia     hard to wake up  . Hypertension     takes Imdur,Coreg,and Lisinopril daily  . Atrial fibrillation ([place])     takes Coumadin daily  . Pneumonia     many,many yrs ago  . Numbness     right hand pointer and middle finger  . Carpal tunnel syndrome     right  . Joint pain   . Joint swelling   . Nocturia   . Hypothyroidism     takes SYnthroid daily  . Anxiety     Past Surgical History  Procedure Laterality Date  . Hernia repair  2009    left inguinal  . Cataract extraction      bilateral  . Colonoscopy  2007    Dr. Laural Golden- L sided diverticulosis. Next TCS 2012 due to h/o tubular adenoma.  . Colonoscopy  02/26/2011    Procedure:  COLONOSCOPY;  Surgeon: Daneil Dolin, MD;  [facility];  Service: Endoscopy;  Laterality: N/A;  11:05  . Tee without cardioversion  12/12/2011    Procedure: TRANSESOPHAGEAL ECHOCARDIOGRAM (TEE);  Surgeon: Sanda Klein, MD;  Location: [facility] ENDOSCOPY;  Service: Cardiovascular;  Laterality: N/A;   successful/sinus rhythm  . Cardioversion  12/12/2011    Procedure: CARDIOVERSION;  Surgeon: Sanda Klein, MD;  Location: [facility];  Service: Cardiovascular;  Laterality: N/A;  . Bunionectomy Right 03/17/2013    Procedure: Lillard Anes, Arthroplasty 2nd toe right foot;  Surgeon: Marcheta Grammes, DPM;  Location: [facility];  Service: Orthopedics;  Laterality: Right;  . Aiken osteotomy Right 03/17/2013    Procedure: Barbie Banner OSTEOTOMY;  Surgeon: Marcheta Grammes, DPM;  Location: [facility];  Service: Orthopedics;  Laterality: Right;  . Flexor tenotomy Left 03/17/2013    Procedure: PERCUTANEOUS FLEXOR TENOTOMY 3RD TOE LEFT FOOT;  Surgeon: Marcheta Grammes, DPM;  Location: [facility];  Service: Orthopedics;  Laterality: Left;  . Cardiac catheterization  06/11/2007    PTCA X2 in 06/2007:  2 overlapping Cypher stents to LAD (2.5x13 and 3.0x23)  . Cardiac catheterization  06/21/2007  Cypher stent 2.5 x 13  to OM branch  . Coronary angioplasty      x 3   . Total knee arthroplasty Left 06/14/2013    Procedure: TOTAL KNEE ARTHROPLASTY;  Surgeon: Garald Balding, MD;  Location: [place];  Service: Orthopedics;  Laterality: Left;  . Rot Left     There were no vitals filed for this visit.  Visit Diagnosis:  Shoulder stiffness, left  Shoulder weakness  Tight fascia      Subjective Assessment - 01/15/15 0915    Subjective  S:  I was sore over the weekend, i think its the weather   Currently in Pain? No/denies            [facility] OT Assessment - 01/15/15 0001    Assessment   Diagnosis Left Shoulder RCR, SAD, DCR   Precautions   Precautions Shoulder   Type of Shoulder Precautions  PROM for 4 weeks (until 12/07/14) then progress to AAROM (until 01/04/15). Progress to AROM and progress as tolerated.                  OT Treatments/Exercises (OP) - 01/15/15 0001    Exercises   Exercises Shoulder   Shoulder Exercises: Supine   Protraction PROM;AROM;10 reps   Horizontal ABduction PROM;5 reps;AROM;10 reps   External Rotation PROM;5 reps;AROM;10 reps   Internal Rotation PROM;5 reps;AROM;10 reps   Flexion PROM;5 reps;AROM;10 reps   ABduction PROM;5 reps;AROM;10 reps   Shoulder Exercises: Seated   Protraction AAROM;12 reps   Horizontal ABduction AAROM;12 reps   External Rotation AAROM;12 reps   Internal Rotation AAROM;12 reps   Flexion AAROM;12 reps   Abduction AAROM;12 reps   Shoulder Exercises: ROM/Strengthening   Proximal Shoulder Strengthening, Supine 10X no rest breaks   Other ROM/Strengthening Exercises functional reaching activity graduated pinch tree:  seated reaching to approximately 95 degrees flexion and then removed  stopped approximately 7 inches from top.                 OT Education - 01/15/15 1008    Education provided Yes   Education Details supine A/ROM    Person(s) Educated Patient   Methods Explanation;Demonstration;Handout   Comprehension Verbalized understanding          OT Short Term Goals - 12/12/14 1044    OT SHORT TERM GOAL #1   Title Patient will be education and independent with HEP.   Status On-going   OT SHORT TERM GOAL #2   Title Patient will increase PROM to [facility] to increase ability to get dressed with less difficulty.   Status On-going   OT SHORT TERM GOAL #3   Title Patient will increase strength to 3/5 to increase ability to complete daily tasks below shoulder height.    Status On-going   OT SHORT TERM GOAL #4   Title Patient will decrease fascial restrictions from max to min amount.   Status Achieved   OT SHORT TERM GOAL #5   Title Patient will decrease pain during daily tasks to 4/10.   Status  On-going           OT Long Term Goals - 11/20/14 1543    OT LONG TERM GOAL #1   Title Patient will return to highest level of independence with all daily tasks using LUE.   Status On-going   OT LONG TERM GOAL #2   Title Patient will increase AROM to WNL to increase ability to complete daily tasks above shoulder height with  less difficulty.    Status On-going   OT LONG TERM GOAL #3   Title Patient will increase LUE strength to 4/5 to increase ability to hold grandchildren.   Status On-going   OT LONG TERM GOAL #4   Title Patient will decrease fascial restrictions to trace amount.   Status On-going   OT LONG TERM GOAL #5   Title Patient will decrease pain level to 2/10 or less when completing daily tasks.   Status On-going               Plan - 01/15/15 1009    Clinical Impression Statement A:  Patient demonstrating [facility] external rotation and internal rotation in supine this date.  able to reach higher when completing pinch tree this date.    Plan P:  Attempt overhead lace, simulate pulling back on shot gun, complete recertification.         Problem List Patient Active Problem List   Diagnosis Date Noted  . Arthritis of knee, left 06/16/2013  . S/P total knee replacement using cement 06/14/2013  . Osteoarthritis of left knee 03/21/2013  . BPH (benign prostatic hyperplasia)   . GERD (gastroesophageal reflux disease)   . CAD (coronary artery disease)   . Fever 03/20/2013  . Altered mental state 03/20/2013  . Postoperative fever 03/20/2013  . PNA (pneumonia) 03/20/2013  . Preop cardiovascular exam 11/24/2012  . Peroneal tendonitis 10/05/2012  . Arthritis of foot, degenerative 10/05/2012  . HTN (hypertension) 09/25/2012  . Hyperlipidemia 09/25/2012  . Atrial fibrillation ([place]) 06/15/2012  . Long term (current) use of anticoagulants 06/15/2012  . Rotator cuff strain 02/04/2012  . Shoulder pain 02/04/2012  . Personal history of colonic polyps 01/29/2011  . High  risk medication use 01/29/2011    Vangie Bicker, OTR/L [phone]  01/15/2015, 10:13 AM  [place], [place], [address] Phone: [phone]   Fax:  [phone]

## 2015-01-15 NOTE — Patient Instructions (Signed)
Complete 10 times each exercise, 2 times per day, begin lying on your back and as that becomes easier, complete sitting or standing up.  ROM: Abduction (Standing)   Bring arms straight out from sides and raise as high as possible without pain. Repeat ____ times per set. Do ____ sets per session. Do ____ sessions per day.  http://orth.exer.us/910   Copyright  VHI. All rights reserved.   Extension (Active) ROM: Extension (Standing)   Bring arms straight back as far as possible without pain. Repeat ____ times per set. Do ____ sets per session. Do ____ sessions per day.  http://orth.exer.us/916   Copyright  VHI. All rights reserved.   ROM: External / Internal Rotation - in Abduction (Standing)   With upper arms parallel to floor and elbows bent at right angles, gently rotate arms up then down as far as possible without pain. Repeat ____ times per set. Do ____ sets per session. Do ____ sessions per day.  http://orth.exer.us/912   Copyright  VHI. All rights reserved.    Flexors Stretch (Active)   Stand, arms straight at sides. Bring arms straight forward and upward as high as possible without pain. Hold ___ seconds. Repeat ___ times per session. Do ___ sessions per day.  Copyright  VHI. All rights reserved.   Scapular Retraction (Standing)   With arms at sides, pinch shoulder blades together. Repeat ____ times per set. Do ____ sets per session. Do ____ sessions per day.  http://orth.exer.us/944   Copyright  VHI. All rights reserved.

## 2015-01-17 ENCOUNTER — Other Ambulatory Visit: Payer: Self-pay | Admitting: Cardiovascular Disease

## 2015-01-17 ENCOUNTER — Ambulatory Visit (HOSPITAL_COMMUNITY): Payer: Medicare Other | Admitting: Occupational Therapy

## 2015-01-17 ENCOUNTER — Encounter (HOSPITAL_COMMUNITY): Payer: Self-pay | Admitting: Occupational Therapy

## 2015-01-17 DIAGNOSIS — M25522 Pain in left elbow: Secondary | ICD-10-CM

## 2015-01-17 DIAGNOSIS — M25612 Stiffness of left shoulder, not elsewhere classified: Secondary | ICD-10-CM

## 2015-01-17 DIAGNOSIS — R29898 Other symptoms and signs involving the musculoskeletal system: Secondary | ICD-10-CM

## 2015-01-17 DIAGNOSIS — M629 Disorder of muscle, unspecified: Secondary | ICD-10-CM

## 2015-01-17 DIAGNOSIS — M25512 Pain in left shoulder: Secondary | ICD-10-CM | POA: Diagnosis not present

## 2015-01-17 DIAGNOSIS — M6289 Other specified disorders of muscle: Secondary | ICD-10-CM

## 2015-01-17 NOTE — Therapy (Addendum)
[place], [place], [address] Phone: [phone]   Fax:  [phone]  Occupational Therapy Reassessment and Treatment  Patient Details  Name: Jerry Cain MRN: [id number] Date of Birth: [birth year] Referring Provider:  Garald Balding, MD  Encounter Date: 01/17/2015      OT End of Session - 01/17/15 0943    Visit Number 24   Number of Visits 36   Date for OT Re-Evaluation 03/18/15  mini reassessment 02/15/15   Authorization Type Medicare   Authorization Time Period before 30th visit   Authorization - Visit Number 24   Authorization - Number of Visits 30   OT Start Time 0845   OT Stop Time 0933   OT Time Calculation (min) 48 min   Activity Tolerance Patient tolerated treatment well   Behavior During Therapy [facility] for tasks assessed/performed      Past Medical History  Diagnosis Date  . Hypercholesterolemia     takes Atorvastatin daily  . Gout   . BPH (benign prostatic hyperplasia)     takes Proscar daily  . CAD (coronary artery disease) 2009    cardiac cath and PTCA by Dr. Tami Ribas  . Tubular adenoma 2001  . Diverticulosis 2007    tcs by Dr. Laural Golden  . GERD (gastroesophageal reflux disease)   . Adenomatous colon polyp 2001  . Osteoarthritis     left knee  . Complication of anesthesia     hard to wake up  . Hypertension     takes Imdur,Coreg,and Lisinopril daily  . Atrial fibrillation ([place])     takes Coumadin daily  . Pneumonia     many,many yrs ago  . Numbness     right hand pointer and middle finger  . Carpal tunnel syndrome     right  . Joint pain   . Joint swelling   . Nocturia   . Hypothyroidism     takes SYnthroid daily  . Anxiety     Past Surgical History  Procedure Laterality Date  . Hernia repair  2009    left inguinal  . Cataract extraction      bilateral  . Colonoscopy  2007    Dr. Laural Golden- L sided diverticulosis. Next TCS 2012 due to h/o tubular adenoma.  .  Colonoscopy  02/26/2011    Procedure: COLONOSCOPY;  Surgeon: Daneil Dolin, MD;  [facility];  Service: Endoscopy;  Laterality: N/A;  11:05  . Tee without cardioversion  12/12/2011    Procedure: TRANSESOPHAGEAL ECHOCARDIOGRAM (TEE);  Surgeon: Sanda Klein, MD;  Location: [facility] ENDOSCOPY;  Service: Cardiovascular;  Laterality: N/A;   successful/sinus rhythm  . Cardioversion  12/12/2011    Procedure: CARDIOVERSION;  Surgeon: Sanda Klein, MD;  Location: [place];  Service: Cardiovascular;  Laterality: N/A;  . Bunionectomy Right 03/17/2013    Procedure: Lillard Anes, Arthroplasty 2nd toe right foot;  Surgeon: Marcheta Grammes, DPM;  Location: [facility];  Service: Orthopedics;  Laterality: Right;  . Aiken osteotomy Right 03/17/2013    Procedure: Barbie Banner OSTEOTOMY;  Surgeon: Marcheta Grammes, DPM;  [facility];  Service: Orthopedics;  Laterality: Right;  . Flexor tenotomy Left 03/17/2013    Procedure: PERCUTANEOUS FLEXOR TENOTOMY 3RD TOE LEFT FOOT;  Surgeon: Marcheta Grammes, DPM;  Location: [facility];  Service: Orthopedics;  Laterality: Left;  . Cardiac catheterization  06/11/2007    PTCA X2 in 06/2007:  2 overlapping Cypher stents to LAD (2.5x13 and 3.0x23)  . Cardiac  catheterization  06/21/2007    Cypher stent 2.5 x 13  to OM branch  . Coronary angioplasty      x 3   . Total knee arthroplasty Left 06/14/2013    Procedure: TOTAL KNEE ARTHROPLASTY;  Surgeon: Garald Balding, MD;  Location: [place];  Service: Orthopedics;  Laterality: Left;  . Rot Left     There were no vitals filed for this visit.  Visit Diagnosis:  Shoulder stiffness, left  Shoulder weakness  Tight fascia  Pain in joint, shoulder region, left  Pain in elbow joint, left      Subjective Assessment - 01/17/15 0848    Subjective  S: I was up at 2 o'clock this morning reading, so i decided to do my exercises while I was up.    Currently in Pain? No/denies           [facility] OT Assessment  - 01/17/15 0847    Assessment   Diagnosis Left Shoulder RCR, SAD, DCR   Precautions   Precautions Shoulder   Type of Shoulder Precautions PROM for 4 weeks (until 12/07/14) then progress to AAROM (until 01/04/15). Progress to AROM and progress as tolerated.   Palpation   Palpation comment mod fascial restrictions in left upper arm, trapezius, and scapularis regions   AROM   Overall AROM Comments Assessed in supine, ER/IR adducted    AROM Assessment Site Shoulder   Right/Left Shoulder Left   Left Shoulder Flexion 120 Degrees   Left Shoulder ABduction 70 Degrees   Left Shoulder Internal Rotation 90 Degrees   Left Shoulder External Rotation 70 Degrees   PROM   Overall PROM Comments assessed in supine ER/IR in adducted position    PROM Assessment Site Shoulder   Right/Left Shoulder Left   Left Shoulder Flexion 135 Degrees  previous 120   Left Shoulder ABduction 105 Degrees  previous 100   Left Shoulder Internal Rotation 90 Degrees  same as previous   Left Shoulder External Rotation 80 Degrees  previous 63   Left Elbow Flexion 128  previous 126   Left Elbow Extension -27  previous -25   Strength   Overall Strength Comments Assessed seated, ER/IR adducted   Strength Assessment Site Shoulder   Right/Left Shoulder Left   Left Shoulder Flexion 3/5   Left Shoulder ABduction 3-/5   Left Shoulder Internal Rotation 3+/5   Left Shoulder External Rotation 3+/5                  OT Treatments/Exercises (OP) - 01/17/15 0848    Exercises   Exercises Shoulder   Shoulder Exercises: Supine   Protraction PROM;AROM;10 reps   Horizontal ABduction PROM;5 reps;AROM;10 reps   External Rotation PROM;5 reps;AROM;10 reps   Internal Rotation PROM;5 reps;AROM;10 reps   Flexion PROM;5 reps;AROM;10 reps   ABduction PROM;5 reps;AROM;10 reps   Shoulder Exercises: ROM/Strengthening   Wall Wash 2'   Thumb Tacks 1'   Other ROM/Strengthening Exercises Simulated using pump shotgun with dowel rod.  Pt held dowel rod up to right shoulder with right hand while simulating using the pump with his left hand, by sliding a washcloth up and down the length of the rod. Completed 10X, no complaints of pain   Manual Therapy   Manual Therapy Myofascial release;Muscle Energy Technique   Myofascial Release Myofascial release to left upper arm, trapezius, and scapularis region to decrease fascial restrictions and increase joint mobility in a pain free zone.  MFR to left elbow region to decrease  elbow joint stiffness and pain.    Muscle Energy Technique Muscle Energy technique completed to left anterior and medial deltoid to decrease muscle spasm and increase ROM.                  OT Short Term Goals - 01/17/15 0943    OT SHORT TERM GOAL #1   Title Patient will be education and independent with HEP.   Status On-going   OT SHORT TERM GOAL #2   Title Patient will increase PROM to [facility] to increase ability to get dressed with less difficulty.   Status On-going   OT SHORT TERM GOAL #3   Title Patient will increase strength to 3/5 to increase ability to complete daily tasks below shoulder height.    Status Achieved   OT SHORT TERM GOAL #4   Title Patient will decrease fascial restrictions from max to min amount.   Status Achieved   OT SHORT TERM GOAL #5   Title Patient will decrease pain during daily tasks to 4/10.   Status Achieved           OT Long Term Goals - 01/17/15 0944    OT LONG TERM GOAL #1   Title Patient will return to highest level of independence with all daily tasks using LUE.   Status On-going   OT LONG TERM GOAL #2   Title Patient will increase AROM to WNL to increase ability to complete daily tasks above shoulder height with less difficulty.    Status On-going   OT LONG TERM GOAL #3   Title Patient will increase LUE strength to 4/5 to increase ability to hold grandchildren.   Status On-going   OT LONG TERM GOAL #4   Title Patient will decrease fascial restrictions  to trace amount.   Status On-going   OT LONG TERM GOAL #5   Title Patient will decrease pain level to 2/10 or less when completing daily tasks.   Status On-going               Plan - 01/17/15 0943    Clinical Impression Statement A: Reassessment and recertification completed this session. Manual therapy completed prior to ROM assessment. Pt has met 3/5 STGs and is progressing towards remaining goals. Pt is now able to lift arm over his head and is experiencing minimal pain during daily tasks. Simulated using pump shotgun this session with dowel rod, pt able to complete with minimal difficulty.    Plan P: Continue occupational therapy 3x/week for 4 weeks working to increase P/ROM and A/ROM to [facility], strengthening and functional use of LUE. Next session: Continue working to increase P/ROM to [facility], continue pump shotgun simulation 15X, resumed missed exercises.         Problem List Patient Active Problem List   Diagnosis Date Noted  . Arthritis of knee, left 06/16/2013  . S/P total knee replacement using cement 06/14/2013  . Osteoarthritis of left knee 03/21/2013  . BPH (benign prostatic hyperplasia)   . GERD (gastroesophageal reflux disease)   . CAD (coronary artery disease)   . Fever 03/20/2013  . Altered mental state 03/20/2013  . Postoperative fever 03/20/2013  . PNA (pneumonia) 03/20/2013  . Preop cardiovascular exam 11/24/2012  . Peroneal tendonitis 10/05/2012  . Arthritis of foot, degenerative 10/05/2012  . HTN (hypertension) 09/25/2012  . Hyperlipidemia 09/25/2012  . Atrial fibrillation ([place]) 06/15/2012  . Long term (current) use of anticoagulants 06/15/2012  . Rotator cuff strain 02/04/2012  . Shoulder pain  02/04/2012  . Personal history of colonic polyps 01/29/2011  . High risk medication use 01/29/2011    Guadelupe Sabin, OTR/L  [phone]  01/17/2015, 9:55 AM  [place] [address] [place], [place],  [address] Phone: [phone]   Fax:  [phone]

## 2015-01-17 NOTE — Telephone Encounter (Signed)
Rx(s) sent to pharmacy electronically.  

## 2015-01-19 ENCOUNTER — Encounter (HOSPITAL_COMMUNITY): Payer: Self-pay

## 2015-01-19 ENCOUNTER — Ambulatory Visit (HOSPITAL_COMMUNITY): Payer: Medicare Other

## 2015-01-19 DIAGNOSIS — M6289 Other specified disorders of muscle: Secondary | ICD-10-CM

## 2015-01-19 DIAGNOSIS — R29898 Other symptoms and signs involving the musculoskeletal system: Secondary | ICD-10-CM | POA: Diagnosis not present

## 2015-01-19 DIAGNOSIS — M25512 Pain in left shoulder: Secondary | ICD-10-CM | POA: Diagnosis not present

## 2015-01-19 DIAGNOSIS — M25522 Pain in left elbow: Secondary | ICD-10-CM | POA: Diagnosis not present

## 2015-01-19 DIAGNOSIS — M629 Disorder of muscle, unspecified: Secondary | ICD-10-CM | POA: Diagnosis not present

## 2015-01-19 DIAGNOSIS — M25612 Stiffness of left shoulder, not elsewhere classified: Secondary | ICD-10-CM

## 2015-01-19 NOTE — Therapy (Signed)
[place], [place], [address] Phone: [phone]   Fax:  [phone]  Occupational Therapy Treatment  Patient Details  Name: Jerry Cain MRN: [id number] Date of Birth: May 18, 1931 No Data Recorded  Encounter Date: 01/19/2015      OT End of Session - 01/19/15 0955    Visit Number 25   Number of Visits 36   Date for OT Re-Evaluation 03/18/15  mini reassessment 02/15/15   Authorization Type Medicare   Authorization Time Period before 30th visit   Authorization - Visit Number 60   Authorization - Number of Visits 30   OT Start Time 0845   OT Stop Time 0930   OT Time Calculation (min) 45 min   Activity Tolerance Patient tolerated treatment well   Behavior During Therapy [facility] for tasks assessed/performed      Past Medical History  Diagnosis Date  . Hypercholesterolemia     takes Atorvastatin daily  . Gout   . BPH (benign prostatic hyperplasia)     takes Proscar daily  . CAD (coronary artery disease) 2009    cardiac cath and PTCA by Dr. Tami Ribas  . Tubular adenoma 2001  . Diverticulosis 2007    tcs by Dr. Laural Golden  . GERD (gastroesophageal reflux disease)   . Adenomatous colon polyp 2001  . Osteoarthritis     left knee  . Complication of anesthesia     hard to wake up  . Hypertension     takes Imdur,Coreg,and Lisinopril daily  . Atrial fibrillation ([place])     takes Coumadin daily  . Pneumonia     many,many yrs ago  . Numbness     right hand pointer and middle finger  . Carpal tunnel syndrome     right  . Joint pain   . Joint swelling   . Nocturia   . Hypothyroidism     takes SYnthroid daily  . Anxiety     Past Surgical History  Procedure Laterality Date  . Hernia repair  2009    left inguinal  . Cataract extraction      bilateral  . Colonoscopy  2007    Dr. Laural Golden- L sided diverticulosis. Next TCS 2012 due to h/o tubular adenoma.  . Colonoscopy  02/26/2011    Procedure:  COLONOSCOPY;  Surgeon: Daneil Dolin, MD;  [facility];  Service: Endoscopy;  Laterality: N/A;  11:05  . Tee without cardioversion  12/12/2011    Procedure: TRANSESOPHAGEAL ECHOCARDIOGRAM (TEE);  Surgeon: Sanda Klein, MD;  Location: [facility] ENDOSCOPY;  Service: Cardiovascular;  Laterality: N/A;   successful/sinus rhythm  . Cardioversion  12/12/2011    Procedure: CARDIOVERSION;  Surgeon: Sanda Klein, MD;  Location: [place];  Service: Cardiovascular;  Laterality: N/A;  . Bunionectomy Right 03/17/2013    Procedure: Lillard Anes, Arthroplasty 2nd toe right foot;  Surgeon: Marcheta Grammes, DPM;  Location: [facility];  Service: Orthopedics;  Laterality: Right;  . Aiken osteotomy Right 03/17/2013    Procedure: Barbie Banner OSTEOTOMY;  Surgeon: Marcheta Grammes, DPM;  Location: [facility];  Service: Orthopedics;  Laterality: Right;  . Flexor tenotomy Left 03/17/2013    Procedure: PERCUTANEOUS FLEXOR TENOTOMY 3RD TOE LEFT FOOT;  Surgeon: Marcheta Grammes, DPM;  Location: [facility];  Service: Orthopedics;  Laterality: Left;  . Cardiac catheterization  06/11/2007    PTCA X2 in 06/2007:  2 overlapping Cypher stents to LAD (2.5x13 and 3.0x23)  . Cardiac catheterization  06/21/2007  Cypher stent 2.5 x 13  to OM branch  . Coronary angioplasty      x 3   . Total knee arthroplasty Left 06/14/2013    Procedure: TOTAL KNEE ARTHROPLASTY;  Surgeon: Garald Balding, MD;  Location: [place];  Service: Orthopedics;  Laterality: Left;  . Rot Left     There were no vitals filed for this visit.  Visit Diagnosis:  Shoulder weakness  Tight fascia  Shoulder stiffness, left      Subjective Assessment - 01/19/15 0916    Subjective  S: I"m feeling stiff this morning.    Currently in Pain? No/denies            [facility] OT Assessment - 01/19/15 0916    Assessment   Diagnosis Left Shoulder RCR, SAD, DCR   Precautions   Precautions Shoulder   Type of Shoulder Precautions PROM for 4 weeks (until  12/07/14) then progress to AAROM (until 01/04/15). Progress to AROM and progress as tolerated.                  OT Treatments/Exercises (OP) - 01/19/15 0917    Exercises   Exercises Shoulder   Shoulder Exercises: Supine   Protraction PROM;10 reps;AROM;12 reps   Horizontal ABduction PROM;10 reps;AROM;12 reps   External Rotation PROM;10 reps;AROM;12 reps   Internal Rotation PROM;10 reps;AROM;12 reps   Flexion PROM;10 reps;AROM;12 reps   ABduction PROM;10 reps;AROM;12 reps   Shoulder Exercises: Seated   Protraction AROM;10 reps   Horizontal ABduction AROM;10 reps   External Rotation AROM;10 reps   Internal Rotation AROM;10 reps   Flexion AROM;10 reps   Abduction AROM;10 reps   Shoulder Exercises: Standing   Extension Theraband;20 reps   Theraband Level (Shoulder Extension) Level 2 (Red)   Retraction Theraband;20 reps   Theraband Level (Shoulder Retraction) Level 2 (Red)   Shoulder Exercises: ROM/Strengthening   Proximal Shoulder Strengthening, Supine 12X no rest breaks   Manual Therapy   Manual Therapy Myofascial release;Muscle Energy Technique   Myofascial Release Myofascial release to left upper arm, trapezius, and scapularis region to decrease fascial restrictions and increase joint mobility in a pain free zone.  MFR to left elbow region to decrease elbow joint stiffness and pain.    Muscle Energy Technique Muscle Energy technique completed to left anterior and medial deltoid to decrease muscle spasm and increase ROM.                   OT Short Term Goals - 01/19/15 0916    OT SHORT TERM GOAL #1   Title Patient will be education and independent with HEP.   Status On-going   OT SHORT TERM GOAL #2   Title Patient will increase PROM to [facility] to increase ability to get dressed with less difficulty.   Status On-going   OT SHORT TERM GOAL #3   Title Patient will increase strength to 3/5 to increase ability to complete daily tasks below shoulder height.    OT SHORT  TERM GOAL #4   Title Patient will decrease fascial restrictions from max to min amount.   OT SHORT TERM GOAL #5   Title Patient will decrease pain during daily tasks to 4/10.           OT Long Term Goals - 01/17/15 0944    OT LONG TERM GOAL #1   Title Patient will return to highest level of independence with all daily tasks using LUE.   Status On-going   OT LONG  TERM GOAL #2   Title Patient will increase AROM to WNL to increase ability to complete daily tasks above shoulder height with less difficulty.    Status On-going   OT LONG TERM GOAL #3   Title Patient will increase LUE strength to 4/5 to increase ability to hold grandchildren.   Status On-going   OT LONG TERM GOAL #4   Title Patient will decrease fascial restrictions to trace amount.   Status On-going   OT LONG TERM GOAL #5   Title Patient will decrease pain level to 2/10 or less when completing daily tasks.   Status On-going               Plan - 01/19/15 0956    Clinical Impression Statement A: Completed A/ROM seated. Increased theraband repetitions to 12. Resumed muscle energy technique with slight increase in abduction.    Plan P: Increase theraband band to green. Complete simulated cocking pistol action with green or blue theraband.        Problem List Patient Active Problem List   Diagnosis Date Noted  . Arthritis of knee, left 06/16/2013  . S/P total knee replacement using cement 06/14/2013  . Osteoarthritis of left knee 03/21/2013  . BPH (benign prostatic hyperplasia)   . GERD (gastroesophageal reflux disease)   . CAD (coronary artery disease)   . Fever 03/20/2013  . Altered mental state 03/20/2013  . Postoperative fever 03/20/2013  . PNA (pneumonia) 03/20/2013  . Preop cardiovascular exam 11/24/2012  . Peroneal tendonitis 10/05/2012  . Arthritis of foot, degenerative 10/05/2012  . HTN (hypertension) 09/25/2012  . Hyperlipidemia 09/25/2012  . Atrial fibrillation ([place]) 06/15/2012  . Long  term (current) use of anticoagulants 06/15/2012  . Rotator cuff strain 02/04/2012  . Shoulder pain 02/04/2012  . Personal history of colonic polyps 01/29/2011  . High risk medication use 01/29/2011    Ailene Ravel, OTR/L,CBIS  [phone]  01/19/2015, 10:00 AM  [place], [place], [address] Phone: [phone]   Fax:  [phone]  Name: Jerry Cain MRN: [id number] Date of Birth: 18-Nov-1931

## 2015-01-22 ENCOUNTER — Ambulatory Visit (HOSPITAL_COMMUNITY): Payer: Medicare Other | Admitting: Specialist

## 2015-01-22 DIAGNOSIS — M629 Disorder of muscle, unspecified: Secondary | ICD-10-CM | POA: Diagnosis not present

## 2015-01-22 DIAGNOSIS — R29898 Other symptoms and signs involving the musculoskeletal system: Secondary | ICD-10-CM | POA: Diagnosis not present

## 2015-01-22 DIAGNOSIS — M25522 Pain in left elbow: Secondary | ICD-10-CM | POA: Diagnosis not present

## 2015-01-22 DIAGNOSIS — M25512 Pain in left shoulder: Secondary | ICD-10-CM | POA: Diagnosis not present

## 2015-01-22 DIAGNOSIS — M25612 Stiffness of left shoulder, not elsewhere classified: Secondary | ICD-10-CM

## 2015-01-22 NOTE — Therapy (Signed)
[place], [place], [address] Phone: [phone]   Fax:  [phone]  Occupational Therapy Treatment  Patient Details  Name: Jerry Cain MRN: [id number] Date of Birth: 1931/04/21 Referring Provider: Dr. Durward Fortes  Encounter Date: 01/22/2015      OT End of Session - 01/22/15 1220    Visit Number 26   Number of Visits 36   Date for OT Re-Evaluation 03/18/15  mini reassess on 02/15/15   Authorization Type Medicare   Authorization Time Period before 30th visit   Authorization - Visit Number 26   Authorization - Number of Visits 30   OT Start Time 1021   OT Stop Time 1105   OT Time Calculation (min) 44 min   Activity Tolerance Patient tolerated treatment well   Behavior During Therapy [facility] for tasks assessed/performed      Past Medical History  Diagnosis Date  . Hypercholesterolemia     takes Atorvastatin daily  . Gout   . BPH (benign prostatic hyperplasia)     takes Proscar daily  . CAD (coronary artery disease) 2009    cardiac cath and PTCA by Dr. Tami Ribas  . Tubular adenoma 2001  . Diverticulosis 2007    tcs by Dr. Laural Golden  . GERD (gastroesophageal reflux disease)   . Adenomatous colon polyp 2001  . Osteoarthritis     left knee  . Complication of anesthesia     hard to wake up  . Hypertension     takes Imdur,Coreg,and Lisinopril daily  . Atrial fibrillation ([place])     takes Coumadin daily  . Pneumonia     many,many yrs ago  . Numbness     right hand pointer and middle finger  . Carpal tunnel syndrome     right  . Joint pain   . Joint swelling   . Nocturia   . Hypothyroidism     takes SYnthroid daily  . Anxiety     Past Surgical History  Procedure Laterality Date  . Hernia repair  2009    left inguinal  . Cataract extraction      bilateral  . Colonoscopy  2007    Dr. Laural Golden- L sided diverticulosis. Next TCS 2012 due to h/o tubular adenoma.  . Colonoscopy  02/26/2011   Procedure: COLONOSCOPY;  Surgeon: Daneil Dolin, MD;  [facility];  Service: Endoscopy;  Laterality: N/A;  11:05  . Tee without cardioversion  12/12/2011    Procedure: TRANSESOPHAGEAL ECHOCARDIOGRAM (TEE);  Surgeon: Sanda Klein, MD;  Location: [facility] ENDOSCOPY;  Service: Cardiovascular;  Laterality: N/A;   successful/sinus rhythm  . Cardioversion  12/12/2011    Procedure: CARDIOVERSION;  Surgeon: Sanda Klein, MD;  Location: [place];  Service: Cardiovascular;  Laterality: N/A;  . Bunionectomy Right 03/17/2013    Procedure: Lillard Anes, Arthroplasty 2nd toe right foot;  Surgeon: Marcheta Grammes, DPM;  [facility];  Service: Orthopedics;  Laterality: Right;  . Aiken osteotomy Right 03/17/2013    Procedure: Barbie Banner OSTEOTOMY;  Surgeon: Marcheta Grammes, DPM;  Location: [facility];  Service: Orthopedics;  Laterality: Right;  . Flexor tenotomy Left 03/17/2013    Procedure: PERCUTANEOUS FLEXOR TENOTOMY 3RD TOE LEFT FOOT;  Surgeon: Marcheta Grammes, DPM;  [facility];  Service: Orthopedics;  Laterality: Left;  . Cardiac catheterization  06/11/2007    PTCA X2 in 06/2007:  2 overlapping Cypher stents to LAD (2.5x13 and 3.0x23)  . Cardiac catheterization  06/21/2007  Cypher stent 2.5 x 13  to OM branch  . Coronary angioplasty      x 3   . Total knee arthroplasty Left 06/14/2013    Procedure: TOTAL KNEE ARTHROPLASTY;  Surgeon: Garald Balding, MD;  Location: [place];  Service: Orthopedics;  Laterality: Left;  . Rot Left     There were no vitals filed for this visit.  Visit Diagnosis:  Shoulder weakness  Shoulder stiffness, left      Subjective Assessment - 01/22/15 1022    Subjective  S:  My shoulder is doing fine.  The only thing I cant do is jumping jacks   Currently in Pain? No/denies            [facility] OT Assessment - 01/22/15 0001    Assessment   Diagnosis Left Shoulder RCR, SAD, DCR   Referring Provider Dr. Durward Fortes   Precautions    Precautions Shoulder   Type of Shoulder Precautions PROM for 4 weeks (until 12/07/14) then progress to AAROM (until 01/04/15). Progress to AROM and progress as tolerated.                  OT Treatments/Exercises (OP) - 01/22/15 1036    Exercises   Exercises Shoulder   Shoulder Exercises: Supine   Protraction PROM;10 reps;AROM;12 reps   Horizontal ABduction PROM;10 reps;AROM;12 reps   External Rotation PROM;10 reps;AROM;12 reps   Internal Rotation PROM;10 reps;AROM;12 reps   Flexion PROM;10 reps;AROM;12 reps   ABduction PROM;10 reps;AROM;12 reps   Shoulder Exercises: Seated   Protraction AROM;12 reps   Horizontal ABduction AROM;12 reps   External Rotation AROM;12 reps   Internal Rotation AROM;12 reps   Flexion AROM;12 reps   Abduction AROM;12 reps   Shoulder Exercises: Standing   External Rotation Theraband;15 reps   Theraband Level (Shoulder External Rotation) Level 3 (Green)   Internal Rotation Theraband;15 reps   Theraband Level (Shoulder Internal Rotation) Level 3 (Green)   Extension [employer] reps   Theraband Level (Shoulder Extension) Level 3 (Green)   Row [employer] reps   Theraband Level (Shoulder Row) Level 3 (Green)   Retraction Theraband;15 reps   Theraband Level (Shoulder Retraction) Level 3 (Green)   Shoulder Exercises: ROM/Strengthening   UBE (Upper Arm Bike) 2' forward and 2' reverse at 1.0   Proximal Shoulder Strengthening, Seated 10 X no rest breaks   Other ROM/Strengthening Exercises simulated using pump shot gun with pvc pipe and blue theraband 20 repetiions with arm supinated with min difficulty, when forearm pronated as is with the actual task, activity became more painful and difficult and patient completed 15 addtional repetitions   Manual Therapy   Manual Therapy Myofascial release   Myofascial Release Myofascial release to left upper arm, trapezius, and scapularis region to decrease fascial restrictions and increase joint mobility in a pain  free zone.  MFR to left elbow region to decrease elbow joint stiffness and pain.                 OT Education - 01/22/15 1220    Education provided Yes   Education Details patient to simulate air pump rifle at home with theraband or bungee cord and empty rifle   Person(s) Educated Patient   Methods Explanation;Demonstration   Comprehension Verbalized understanding;Returned demonstration          OT Short Term Goals - 01/19/15 0916    OT SHORT TERM GOAL #1   Title Patient will be education and independent with HEP.   Status  On-going   OT SHORT TERM GOAL #2   Title Patient will increase PROM to [facility] to increase ability to get dressed with less difficulty.   Status On-going   OT SHORT TERM GOAL #3   Title Patient will increase strength to 3/5 to increase ability to complete daily tasks below shoulder height.    OT SHORT TERM GOAL #4   Title Patient will decrease fascial restrictions from max to min amount.   OT SHORT TERM GOAL #5   Title Patient will decrease pain during daily tasks to 4/10.           OT Long Term Goals - 01/17/15 0944    OT LONG TERM GOAL #1   Title Patient will return to highest level of independence with all daily tasks using LUE.   Status On-going   OT LONG TERM GOAL #2   Title Patient will increase AROM to WNL to increase ability to complete daily tasks above shoulder height with less difficulty.    Status On-going   OT LONG TERM GOAL #3   Title Patient will increase LUE strength to 4/5 to increase ability to hold grandchildren.   Status On-going   OT LONG TERM GOAL #4   Title Patient will decrease fascial restrictions to trace amount.   Status On-going   OT LONG TERM GOAL #5   Title Patient will decrease pain level to 2/10 or less when completing daily tasks.   Status On-going               Plan - 01/22/15 1221    Clinical Impression Statement A:  Patient able to complete abduction A/ROM this date in supine and seated with less  difficulty than initially thought.  Increased to blue theraband with rifle loading activity - patient has significant increase in pain and difficulty when completing with forearm pronated as the task requires.    Plan P:  Complete pistol loading activity with black theraband with arm supinated and decrease amount of difficulty with blue theraband doing actiivty with forearm pronated.         Problem List Patient Active Problem List   Diagnosis Date Noted  . Arthritis of knee, left 06/16/2013  . S/P total knee replacement using cement 06/14/2013  . Osteoarthritis of left knee 03/21/2013  . BPH (benign prostatic hyperplasia)   . GERD (gastroesophageal reflux disease)   . CAD (coronary artery disease)   . Fever 03/20/2013  . Altered mental state 03/20/2013  . Postoperative fever 03/20/2013  . PNA (pneumonia) 03/20/2013  . Preop cardiovascular exam 11/24/2012  . Peroneal tendonitis 10/05/2012  . Arthritis of foot, degenerative 10/05/2012  . HTN (hypertension) 09/25/2012  . Hyperlipidemia 09/25/2012  . Atrial fibrillation ([place]) 06/15/2012  . Long term (current) use of anticoagulants 06/15/2012  . Rotator cuff strain 02/04/2012  . Shoulder pain 02/04/2012  . Personal history of colonic polyps 01/29/2011  . High risk medication use 01/29/2011    Vangie Bicker, OTR/L [phone]  01/22/2015, 12:24 PM  [place] [address] [place], [place], [address] Phone: [phone]   Fax:  [phone]  Name: Jerry Cain MRN: [id number] Date of Birth: 12-26-1931

## 2015-01-24 ENCOUNTER — Ambulatory Visit (HOSPITAL_COMMUNITY): Payer: Medicare Other | Admitting: Specialist

## 2015-01-24 DIAGNOSIS — M25522 Pain in left elbow: Secondary | ICD-10-CM | POA: Diagnosis not present

## 2015-01-24 DIAGNOSIS — M629 Disorder of muscle, unspecified: Secondary | ICD-10-CM | POA: Diagnosis not present

## 2015-01-24 DIAGNOSIS — R29898 Other symptoms and signs involving the musculoskeletal system: Secondary | ICD-10-CM

## 2015-01-24 DIAGNOSIS — M25612 Stiffness of left shoulder, not elsewhere classified: Secondary | ICD-10-CM | POA: Diagnosis not present

## 2015-01-24 DIAGNOSIS — M25512 Pain in left shoulder: Secondary | ICD-10-CM | POA: Diagnosis not present

## 2015-01-24 NOTE — Therapy (Addendum)
[place], [place], [address] Phone: [phone]   Fax:  [phone]  Occupational Therapy Treatment  Patient Details  Name: Jerry Cain MRN: [id number] Date of Birth: 27-Jan-1932 Referring Provider: Dr. Durward Fortes  Encounter Date: 01/24/2015      OT End of Session - 01/24/15 0924    Visit Number 27   Number of Visits 36   Date for OT Re-Evaluation 03/18/15  mini reassess on 11/10   Authorization Type Medicare   Authorization Time Period before 30th visit   Authorization - Visit Number 64   OT Start Time 0854   OT Stop Time 0935   OT Time Calculation (min) 41 min   Activity Tolerance Patient tolerated treatment well   Behavior During Therapy [facility] for tasks assessed/performed      Past Medical History  Diagnosis Date  . Hypercholesterolemia     takes Atorvastatin daily  . Gout   . BPH (benign prostatic hyperplasia)     takes Proscar daily  . CAD (coronary artery disease) 2009    cardiac cath and PTCA by Dr. Tami Ribas  . Tubular adenoma 2001  . Diverticulosis 2007    tcs by Dr. Laural Golden  . GERD (gastroesophageal reflux disease)   . Adenomatous colon polyp 2001  . Osteoarthritis     left knee  . Complication of anesthesia     hard to wake up  . Hypertension     takes Imdur,Coreg,and Lisinopril daily  . Atrial fibrillation ([place])     takes Coumadin daily  . Pneumonia     many,many yrs ago  . Numbness     right hand pointer and middle finger  . Carpal tunnel syndrome     right  . Joint pain   . Joint swelling   . Nocturia   . Hypothyroidism     takes SYnthroid daily  . Anxiety     Past Surgical History  Procedure Laterality Date  . Hernia repair  2009    left inguinal  . Cataract extraction      bilateral  . Colonoscopy  2007    Dr. Laural Golden- L sided diverticulosis. Next TCS 2012 due to h/o tubular adenoma.  . Colonoscopy  02/26/2011    Procedure: COLONOSCOPY;  Surgeon: Daneil Dolin, MD;  [facility];  Service: Endoscopy;  Laterality: N/A;  11:05  . Tee without cardioversion  12/12/2011    Procedure: TRANSESOPHAGEAL ECHOCARDIOGRAM (TEE);  Surgeon: Sanda Klein, MD;  Location: [facility] ENDOSCOPY;  Service: Cardiovascular;  Laterality: N/A;   successful/sinus rhythm  . Cardioversion  12/12/2011    Procedure: CARDIOVERSION;  Surgeon: Sanda Klein, MD;  Location: [place];  Service: Cardiovascular;  Laterality: N/A;  . Bunionectomy Right 03/17/2013    Procedure: Lillard Anes, Arthroplasty 2nd toe right foot;  Surgeon: Marcheta Grammes, DPM;  Location: [facility];  Service: Orthopedics;  Laterality: Right;  . Aiken osteotomy Right 03/17/2013    Procedure: Barbie Banner OSTEOTOMY;  Surgeon: Marcheta Grammes, DPM;  Location: [facility];  Service: Orthopedics;  Laterality: Right;  . Flexor tenotomy Left 03/17/2013    Procedure: PERCUTANEOUS FLEXOR TENOTOMY 3RD TOE LEFT FOOT;  Surgeon: Marcheta Grammes, DPM;  Location: [facility];  Service: Orthopedics;  Laterality: Left;  . Cardiac catheterization  06/11/2007    PTCA X2 in 06/2007:  2 overlapping Cypher stents to LAD (2.5x13 and 3.0x23)  . Cardiac catheterization  06/21/2007    Cypher stent 2.5 x 13  to OM branch  . Coronary angioplasty      x 3   . Total knee arthroplasty Left 06/14/2013    Procedure: TOTAL KNEE ARTHROPLASTY;  Surgeon: Garald Balding, MD;  Location: [place];  Service: Orthopedics;  Laterality: Left;  . Rot Left     There were no vitals filed for this visit.  Visit Diagnosis:  Shoulder weakness  Pain in joint, shoulder region, left      Subjective Assessment - 01/24/15 0851    Subjective  S:  I think we did too much when we were pulling the band like the rifle - I have been doing it with less resistance at home and I feel better now.   Currently in Pain? No/denies   Pain Score 0-No pain            [facility] OT Assessment - 01/24/15 0001    Assessment   Diagnosis Left Shoulder RCR,  SAD, DCR   Precautions   Precautions Shoulder   Type of Shoulder Precautions PROM for 4 weeks (until 12/07/14) then progress to AAROM (until 01/04/15). Progress to AROM and progress as tolerated.   Palpation   Palpation comment min-mod fascial restrictions   AROM   Overall AROM Comments Assessed in supine, ER/IR adducted  (01/17/15)  Seated A/ROM assessed for the first time this date:  flexion 100, abduction 75, internal rotation 85, external rotation 65   AROM Assessment Site Shoulder   Right/Left Shoulder Left   Left Shoulder Flexion 135 Degrees  120   Left Shoulder ABduction 120 Degrees  70   Left Shoulder Internal Rotation 90 Degrees  90   Left Shoulder External Rotation 70 Degrees  70   PROM   Left Shoulder Flexion 150 Degrees  135   Left Shoulder ABduction 120 Degrees  105   Left Shoulder Internal Rotation 90 Degrees  90   Left Shoulder External Rotation 80 Degrees  80   Strength   Overall Strength Comments Assessed seated, ER/IR adducted   Strength Assessment Site Shoulder   Right/Left Shoulder Left   Left Shoulder Flexion 3+/5  3/5   Left Shoulder ABduction 3/5  3-/5   Left Shoulder Internal Rotation 4-/5  3+/5   Left Shoulder External Rotation 3+/5  3+/5                  OT Treatments/Exercises (OP) - 01/24/15 0001    Exercises   Exercises Shoulder   Shoulder Exercises: Supine   Protraction PROM;5 reps;AROM;15 reps   Horizontal ABduction PROM;5 reps;AROM;15 reps   External Rotation PROM;5 reps;AROM;15 reps   External Rotation Limitations verbal guidance to adduct upper arm while externally rotating    Internal Rotation PROM;5 reps;AROM;15 reps   Flexion PROM;5 reps;AROM;15 reps   ABduction PROM;5 reps;AROM;15 reps   Shoulder Exercises: ROM/Strengthening   UBE (Upper Arm Bike) 2' forward and 2' reverse at 1.0   Proximal Shoulder Strengthening, Seated 10 X no rest breaks   Ball on Wall 1 minute with arm flexed and 1 min with arm abducted    Other  ROM/Strengthening Exercises sliding hand up and down pvc pipe positioned at 45 degrees to facilitate overhead reach needed for functional activities at home  therapist facilitated scapular rotation for improved shoulder movement   Manual Therapy   Manual Therapy Myofascial release   Myofascial Release Myofascial release to left upper arm, trapezius, and scapularis region to decrease fascial restrictions and increase joint mobility in a pain free zone.  MFR  to left elbow region to decrease elbow joint stiffness and pain. MFR completed prior to assessment of A/PROM                  OT Short Term Goals - 01/19/15 0916    OT SHORT TERM GOAL #1   Title Patient will be education and independent with HEP.   Status On-going   OT SHORT TERM GOAL #2   Title Patient will increase PROM to [facility] to increase ability to get dressed with less difficulty.   Status On-going   OT SHORT TERM GOAL #3   Title Patient will increase strength to 3/5 to increase ability to complete daily tasks below shoulder height.    OT SHORT TERM GOAL #4   Title Patient will decrease fascial restrictions from max to min amount.   OT SHORT TERM GOAL #5   Title Patient will decrease pain during daily tasks to 4/10.           OT Long Term Goals - 01/17/15 0944    OT LONG TERM GOAL #1   Title Patient will return to highest level of independence with all daily tasks using LUE.   Status On-going   OT LONG TERM GOAL #2   Title Patient will increase AROM to WNL to increase ability to complete daily tasks above shoulder height with less difficulty.    Status On-going   OT LONG TERM GOAL #3   Title Patient will increase LUE strength to 4/5 to increase ability to hold grandchildren.   Status On-going   OT LONG TERM GOAL #4   Title Patient will decrease fascial restrictions to trace amount.   Status On-going   OT LONG TERM GOAL #5   Title Patient will decrease pain level to 2/10 or less when completing daily tasks.    Status On-going               Plan - 01/24/15 0925    Clinical Impression Statement A:  Assessed A/ROM in supine and seated position, significant improvements in all ROM this date.  Treatment focused on functional reacihng with therapist providing facilitation for proper mechanics.  Added ball on wall for scapular stability needed for greater shoulder girdle support when reaching overhead.   OT Frequency 3x / week   OT Duration 12 weeks   Plan P:  Improve functional reaching in seated needed for functional activities at home, complete pistol loading activity with arm supinated for preparation for actual pistol loading.         Problem List Patient Active Problem List   Diagnosis Date Noted  . Arthritis of knee, left 06/16/2013  . S/P total knee replacement using cement 06/14/2013  . Osteoarthritis of left knee 03/21/2013  . BPH (benign prostatic hyperplasia)   . GERD (gastroesophageal reflux disease)   . CAD (coronary artery disease)   . Fever 03/20/2013  . Altered mental state 03/20/2013  . Postoperative fever 03/20/2013  . PNA (pneumonia) 03/20/2013  . Preop cardiovascular exam 11/24/2012  . Peroneal tendonitis 10/05/2012  . Arthritis of foot, degenerative 10/05/2012  . HTN (hypertension) 09/25/2012  . Hyperlipidemia 09/25/2012  . Atrial fibrillation ([place]) 06/15/2012  . Long term (current) use of anticoagulants 06/15/2012  . Rotator cuff strain 02/04/2012  . Shoulder pain 02/04/2012  . Personal history of colonic polyps 01/29/2011  . High risk medication use 01/29/2011    Vangie Bicker, OTR/L [phone]  01/24/2015, 9:32 AM  [place]  [address] [place], [place], [address] Phone: [phone]   Fax:  [phone]  Name: TALEN POSER MRN: [id number] Date of Birth: [birth year]

## 2015-01-26 ENCOUNTER — Ambulatory Visit (HOSPITAL_COMMUNITY): Payer: Medicare Other

## 2015-01-26 ENCOUNTER — Encounter (HOSPITAL_COMMUNITY): Payer: Self-pay

## 2015-01-26 DIAGNOSIS — M25612 Stiffness of left shoulder, not elsewhere classified: Secondary | ICD-10-CM

## 2015-01-26 DIAGNOSIS — M25512 Pain in left shoulder: Secondary | ICD-10-CM

## 2015-01-26 DIAGNOSIS — M6289 Other specified disorders of muscle: Secondary | ICD-10-CM

## 2015-01-26 DIAGNOSIS — M629 Disorder of muscle, unspecified: Secondary | ICD-10-CM | POA: Diagnosis not present

## 2015-01-26 DIAGNOSIS — R29898 Other symptoms and signs involving the musculoskeletal system: Secondary | ICD-10-CM | POA: Diagnosis not present

## 2015-01-26 DIAGNOSIS — M25522 Pain in left elbow: Secondary | ICD-10-CM | POA: Diagnosis not present

## 2015-01-26 NOTE — Therapy (Addendum)
[place], [place], [address] Phone: [phone]   Fax:  [phone]  Occupational Therapy Treatment  Patient Details  Name: Jerry Cain MRN: [id number] Date of Birth: 03-09-1932 Referring Provider: Dr. Durward Fortes  Encounter Date: 01/26/2015      OT End of Session - 01/26/15 1002    Visit Number 28   Number of Visits 36   Date for OT Re-Evaluation 03/18/15  mini reassess on 11/10   Authorization Type Medicare   Authorization Time Period before 30th visit   Authorization - Visit Number 40   Authorization - Number of Visits 30   OT Start Time 0850   OT Stop Time 0930   OT Time Calculation (min) 40 min   Activity Tolerance Patient tolerated treatment well   Behavior During Therapy [facility] for tasks assessed/performed      Past Medical History  Diagnosis Date  . Hypercholesterolemia     takes Atorvastatin daily  . Gout   . BPH (benign prostatic hyperplasia)     takes Proscar daily  . CAD (coronary artery disease) 2009    cardiac cath and PTCA by Dr. Tami Ribas  . Tubular adenoma 2001  . Diverticulosis 2007    tcs by Dr. Laural Golden  . GERD (gastroesophageal reflux disease)   . Adenomatous colon polyp 2001  . Osteoarthritis     left knee  . Complication of anesthesia     hard to wake up  . Hypertension     takes Imdur,Coreg,and Lisinopril daily  . Atrial fibrillation ([place])     takes Coumadin daily  . Pneumonia     many,many yrs ago  . Numbness     right hand pointer and middle finger  . Carpal tunnel syndrome     right  . Joint pain   . Joint swelling   . Nocturia   . Hypothyroidism     takes SYnthroid daily  . Anxiety     Past Surgical History  Procedure Laterality Date  . Hernia repair  2009    left inguinal  . Cataract extraction      bilateral  . Colonoscopy  2007    Dr. Laural Golden- L sided diverticulosis. Next TCS 2012 due to h/o tubular adenoma.  . Colonoscopy  02/26/2011     Procedure: COLONOSCOPY;  Surgeon: Daneil Dolin, MD;  [facility];  Service: Endoscopy;  Laterality: N/A;  11:05  . Tee without cardioversion  12/12/2011    Procedure: TRANSESOPHAGEAL ECHOCARDIOGRAM (TEE);  Surgeon: Sanda Klein, MD;  Location: [facility] ENDOSCOPY;  Service: Cardiovascular;  Laterality: N/A;   successful/sinus rhythm  . Cardioversion  12/12/2011    Procedure: CARDIOVERSION;  Surgeon: Sanda Klein, MD;  Location: [place];  Service: Cardiovascular;  Laterality: N/A;  . Bunionectomy Right 03/17/2013    Procedure: Lillard Anes, Arthroplasty 2nd toe right foot;  Surgeon: Marcheta Grammes, DPM;  [facility];  Service: Orthopedics;  Laterality: Right;  . Aiken osteotomy Right 03/17/2013    Procedure: Barbie Banner OSTEOTOMY;  Surgeon: Marcheta Grammes, DPM;  Location[facility];  Service: Orthopedics;  Laterality: Right;  . Flexor tenotomy Left 03/17/2013    Procedure: PERCUTANEOUS FLEXOR TENOTOMY 3RD TOE LEFT FOOT;  Surgeon: Marcheta Grammes, DPM;  [facility];  Service: Orthopedics;  Laterality: Left;  . Cardiac catheterization  06/11/2007    PTCA X2 in 06/2007:  2 overlapping Cypher stents to LAD (2.5x13 and 3.0x23)  . Cardiac catheterization  06/21/2007  Cypher stent 2.5 x 13  to OM branch  . Coronary angioplasty      x 3   . Total knee arthroplasty Left 06/14/2013    Procedure: TOTAL KNEE ARTHROPLASTY;  Surgeon: Garald Balding, MD;  Location: [place];  Service: Orthopedics;  Laterality: Left;  . Rot Left     There were no vitals filed for this visit.  Visit Diagnosis:  Shoulder weakness  Pain in joint, shoulder region, left  Shoulder stiffness, left  Tight fascia      Subjective Assessment - 01/26/15 0916    Currently in Pain? Yes   Pain Score 4    Pain Location Elbow   Pain Orientation Left   Pain Descriptors / Indicators Aching;Sore   Pain Type Acute pain    S: I think my elbow feels a little better today.                    OT Treatments/Exercises (OP) - 01/26/15 0914    Exercises   Exercises Shoulder   Shoulder Exercises: Supine   Protraction PROM;5 reps;Strengthening;10 reps   Protraction Weight (lbs) 1   Horizontal ABduction PROM;5 reps;Strengthening;10 reps   Horizontal ABduction Weight (lbs) 1   External Rotation PROM;5 reps;Strengthening;10 reps   External Rotation Weight (lbs) 1   Internal Rotation PROM;5 reps;Strengthening;10 reps   Internal Rotation Weight (lbs) 1   Flexion PROM;5 reps;Strengthening;10 reps   Shoulder Flexion Weight (lbs) 1   ABduction PROM;5 reps;Strengthening;10 reps   Shoulder ABduction Weight (lbs) 1   Shoulder Exercises: Seated   Horizontal ABduction AROM;12 reps   Flexion AROM;12 reps   Abduction AROM;12 reps   Shoulder Exercises: ROM/Strengthening   Proximal Shoulder Strengthening, Supine 10X with 1#   Other ROM/Strengthening Exercises Simulated cocking of pistol using red theraband; 15X   Manual Therapy   Manual Therapy Myofascial release;Muscle Energy Technique   Myofascial Release Myofascial release to left upper arm, trapezius, and scapularis region to decrease fascial restrictions and increase joint mobility in a pain free zone.  MFR to left elbow region to decrease elbow joint stiffness and pain.    Muscle Energy Technique Muscle Energy technique completed to left anterior and medial deltoid to decrease muscle spasm and increase ROM.                   OT Short Term Goals - 01/19/15 0916    OT SHORT TERM GOAL #1   Title Patient will be education and independent with HEP.   Status On-going   OT SHORT TERM GOAL #2   Title Patient will increase PROM to [facility] to increase ability to get dressed with less difficulty.   Status On-going   OT SHORT TERM GOAL #3   Title Patient will increase strength to 3/5 to increase ability to complete daily tasks below shoulder height.    OT SHORT TERM GOAL #4   Title Patient will decrease  fascial restrictions from max to min amount.   OT SHORT TERM GOAL #5   Title Patient will decrease pain during daily tasks to 4/10.           OT Long Term Goals - 01/17/15 0944    OT LONG TERM GOAL #1   Title Patient will return to highest level of independence with all daily tasks using LUE.   Status On-going   OT LONG TERM GOAL #2   Title Patient will increase AROM to WNL to increase ability to complete daily tasks  above shoulder height with less difficulty.    Status On-going   OT LONG TERM GOAL #3   Title Patient will increase LUE strength to 4/5 to increase ability to hold grandchildren.   Status On-going   OT LONG TERM GOAL #4   Title Patient will decrease fascial restrictions to trace amount.   Status On-going   OT LONG TERM GOAL #5   Title Patient will decrease pain level to 2/10 or less when completing daily tasks.   Status On-going             01/26/15 1003  OT Assessment and Plan  Clinical Impression Statement A: Added 1# weight supine this date. Patient was able to complete exercises with Min VC for technique and form. No complaints of pain.  Plan P:Update G code and complete progress note (send to MD). Cont with 1# weight supine. Update HEP. Attempt 1# seated if able to tolerate.      Problem List Patient Active Problem List   Diagnosis Date Noted  . Arthritis of knee, left 06/16/2013  . S/P total knee replacement using cement 06/14/2013  . Osteoarthritis of left knee 03/21/2013  . BPH (benign prostatic hyperplasia)   . GERD (gastroesophageal reflux disease)   . CAD (coronary artery disease)   . Fever 03/20/2013  . Altered mental state 03/20/2013  . Postoperative fever 03/20/2013  . PNA (pneumonia) 03/20/2013  . Preop cardiovascular exam 11/24/2012  . Peroneal tendonitis 10/05/2012  . Arthritis of foot, degenerative 10/05/2012  . HTN (hypertension) 09/25/2012  . Hyperlipidemia 09/25/2012  . Atrial fibrillation ([place]) 06/15/2012  . Long term  (current) use of anticoagulants 06/15/2012  . Rotator cuff strain 02/04/2012  . Shoulder pain 02/04/2012  . Personal history of colonic polyps 01/29/2011  . High risk medication use 01/29/2011    Ailene Ravel, OTR/L,CBIS  [phone]  01/26/2015, 10:06 AM  [place] [address] [place], [place], [address] Phone: [phone]   Fax:  [phone]  Name: ABRAR BILTON MRN: [id number] Date of Birth: 1931/05/09

## 2015-01-29 ENCOUNTER — Ambulatory Visit (HOSPITAL_COMMUNITY): Payer: Medicare Other | Admitting: Specialist

## 2015-01-29 DIAGNOSIS — R29898 Other symptoms and signs involving the musculoskeletal system: Secondary | ICD-10-CM | POA: Diagnosis not present

## 2015-01-29 DIAGNOSIS — M25612 Stiffness of left shoulder, not elsewhere classified: Secondary | ICD-10-CM | POA: Diagnosis not present

## 2015-01-29 DIAGNOSIS — M25512 Pain in left shoulder: Secondary | ICD-10-CM | POA: Diagnosis not present

## 2015-01-29 DIAGNOSIS — M25522 Pain in left elbow: Secondary | ICD-10-CM | POA: Diagnosis not present

## 2015-01-29 DIAGNOSIS — M629 Disorder of muscle, unspecified: Secondary | ICD-10-CM | POA: Diagnosis not present

## 2015-01-29 NOTE — Therapy (Signed)
[place], [place], [address] Phone: [phone]   Fax:  [phone]  Occupational Therapy Treatment  Patient Details  Name: SOHUM DELILLO MRN: [id number] Date of Birth: 11-18-1931 Referring Provider: Dr. Durward Fortes  Encounter Date: 01/29/2015      OT End of Session - 01/29/15 0922    Visit Number 29   Number of Visits 36   Date for OT Re-Evaluation 03/30/15  02/28/15 mini reassess   Authorization Type Medicare   Authorization Time Period before 39th visit   Authorization - Visit Number 29   Authorization - Number of Visits 15   OT Start Time 0851   OT Stop Time 0934   OT Time Calculation (min) 43 min   Activity Tolerance Patient tolerated treatment well   Behavior During Therapy [facility] for tasks assessed/performed      Past Medical History  Diagnosis Date  . Hypercholesterolemia     takes Atorvastatin daily  . Gout   . BPH (benign prostatic hyperplasia)     takes Proscar daily  . CAD (coronary artery disease) 2009    cardiac cath and PTCA by Dr. Tami Ribas  . Tubular adenoma 2001  . Diverticulosis 2007    tcs by Dr. Laural Golden  . GERD (gastroesophageal reflux disease)   . Adenomatous colon polyp 2001  . Osteoarthritis     left knee  . Complication of anesthesia     hard to wake up  . Hypertension     takes Imdur,Coreg,and Lisinopril daily  . Atrial fibrillation ([place])     takes Coumadin daily  . Pneumonia     many,many yrs ago  . Numbness     right hand pointer and middle finger  . Carpal tunnel syndrome     right  . Joint pain   . Joint swelling   . Nocturia   . Hypothyroidism     takes SYnthroid daily  . Anxiety     Past Surgical History  Procedure Laterality Date  . Hernia repair  2009    left inguinal  . Cataract extraction      bilateral  . Colonoscopy  2007    Dr. Laural Golden- L sided diverticulosis. Next TCS 2012 due to h/o tubular adenoma.  . Colonoscopy  02/26/2011   Procedure: COLONOSCOPY;  Surgeon: Daneil Dolin, MD;  [facility];  Service: Endoscopy;  Laterality: N/A;  11:05  . Tee without cardioversion  12/12/2011    Procedure: TRANSESOPHAGEAL ECHOCARDIOGRAM (TEE);  Surgeon: Sanda Klein, MD;  Location: [facility] ENDOSCOPY;  Service: Cardiovascular;  Laterality: N/A;   successful/sinus rhythm  . Cardioversion  12/12/2011    Procedure: CARDIOVERSION;  Surgeon: Sanda Klein, MD;  Location: [place];  Service: Cardiovascular;  Laterality: N/A;  . Bunionectomy Right 03/17/2013    Procedure: Lillard Anes, Arthroplasty 2nd toe right foot;  Surgeon: Marcheta Grammes, DPM;  Location: [facility];  Service: Orthopedics;  Laterality: Right;  . Aiken osteotomy Right 03/17/2013    Procedure: Barbie Banner OSTEOTOMY;  Surgeon: Marcheta Grammes, DPM;  Location: [facility];  Service: Orthopedics;  Laterality: Right;  . Flexor tenotomy Left 03/17/2013    Procedure: PERCUTANEOUS FLEXOR TENOTOMY 3RD TOE LEFT FOOT;  Surgeon: Marcheta Grammes, DPM;  [facility]: [facility];  Service: Orthopedics;  Laterality: Left;  . Cardiac catheterization  06/11/2007    PTCA X2 in 06/2007:  2 overlapping Cypher stents to LAD (2.5x13 and 3.0x23)  . Cardiac catheterization  06/21/2007    Cypher  stent 2.5 x 13  to OM branch  . Coronary angioplasty      x 3   . Total knee arthroplasty Left 06/14/2013    Procedure: TOTAL KNEE ARTHROPLASTY;  Surgeon: Garald Balding, MD;  Location: [place];  Service: Orthopedics;  Laterality: Left;  . Rot Left     There were no vitals filed for this visit.  Visit Diagnosis:  Shoulder weakness - Plan: Ot plan of care cert/re-cert  Pain in joint, shoulder region, left - Plan: Ot plan of care cert/re-cert  Shoulder stiffness, left - Plan: Ot plan of care cert/re-cert      Subjective Assessment - 01/29/15 0851    Subjective  S:  Heat makes my elbow feel better.    Currently in Pain? Yes   Pain Score 3    Pain Location Elbow   Pain Orientation  Left   Pain Descriptors / Indicators Aching;Sore   Pain Type Acute pain            [facility] OT Assessment - 01/29/15 0001    Assessment   Diagnosis Left Shoulder RCR, SAD, DCR   Referring Provider Dr. Durward Fortes   Onset Date 11/09/14   Precautions   Precautions Shoulder   Type of Shoulder Precautions PROM for 4 weeks (until 12/07/14) then progress to AAROM (until 01/04/15). Progress to AROM and progress as tolerated.   Restrictions   Weight Bearing Restrictions No   Home  Environment   Family/patient expects to be discharged to: Private residence   Living Arrangements Spouse/significant other   Prior Function   Level of [place] Retired   Leisure Patient has 8 grandchildren.   ADL   ADL comments Patient continues to have difficulty reaching overhead.  He has increased ability to reach behind his back and don his coat.  He is not able to load his guns    Observation/Other Assessments   Focus on Therapeutic Outcomes (FOTO)  75% independent   Palpation   Palpation comment min-mod fascial restrictions   AROM   Overall AROM Comments Assessed in supine, ER/IR adducted  (01/17/15)  Seated A/ROM assessed for the first time this date:  flexion 100, abduction 75, internal rotation 85, external rotation 65   AROM Assessment Site Shoulder   Right/Left Shoulder Left   Left Shoulder Flexion 135 Degrees   Left Shoulder ABduction 120 Degrees   Left Shoulder Internal Rotation 90 Degrees   Left Shoulder External Rotation 70 Degrees   PROM   Overall PROM Comments assessed in supine ER/IR in adducted position    Left Shoulder Flexion 150 Degrees   Left Shoulder ABduction 120 Degrees   Left Shoulder Internal Rotation 90 Degrees   Left Shoulder External Rotation 80 Degrees   Strength   Overall Strength Comments Assessed seated, ER/IR adducted   Strength Assessment Site Shoulder   Right/Left Shoulder Left   Left Shoulder Flexion 3+/5   Left Shoulder ABduction 3/5    Left Shoulder Internal Rotation 4-/5   Left Shoulder External Rotation 3+/5                  OT Treatments/Exercises (OP) - 01/29/15 0001    Exercises   Exercises Shoulder   Shoulder Exercises: Supine   Protraction PROM;5 reps;Strengthening;15 reps   Protraction Weight (lbs) 1   Horizontal ABduction PROM;5 reps;Strengthening;15 reps   Horizontal ABduction Weight (lbs) 1   External Rotation PROM;5 reps;Strengthening;15 reps   External Rotation Weight (lbs) 1  Internal Rotation PROM;5 reps;Strengthening;15 reps   Internal Rotation Weight (lbs) 1   Flexion PROM;5 reps;AROM;15 reps   ABduction PROM;5 reps;AROM;15 reps   Other Supine Exercises OTR/L held tband behind patient, extended his arm to his side and then allowed tband to assist him into flexion, with OTR/L providing a gentle pull at the end for greater flexion - completed 10 times   Shoulder Exercises: ROM/Strengthening   UBE (Upper Arm Bike) 3' forward and 3' reverse at 1.0    Proximal Shoulder Strengthening, Supine 10X with 1#   Other ROM/Strengthening Exercises graduated pinch tree able to reach to approximately 4.5" from the top of the vertical rod, last attempt was 7 inches from the top   Other ROM/Strengthening Exercises sliding hand up and down pvc pipe positioned at 45 degrees to facilitate overhead reach needed for functional activities at home  therapist facilitated scapular rotation for improved shoulder movement completed 10 repetitions X 2   Manual Therapy   Manual Therapy Myofascial release;Muscle Energy Technique                  OT Short Term Goals - 01/19/15 8099    OT SHORT TERM GOAL #1   Title Patient will be education and independent with HEP.   Status On-going   OT SHORT TERM GOAL #2   Title Patient will increase PROM to [facility] to increase ability to get dressed with less difficulty.   Status On-going   OT SHORT TERM GOAL #3   Title Patient will increase strength to 3/5 to increase  ability to complete daily tasks below shoulder height.    OT SHORT TERM GOAL #4   Title Patient will decrease fascial restrictions from max to min amount.   OT SHORT TERM GOAL #5   Title Patient will decrease pain during daily tasks to 4/10.           OT Long Term Goals - 01/17/15 0944    OT LONG TERM GOAL #1   Title Patient will return to highest level of independence with all daily tasks using LUE.   Status On-going   OT LONG TERM GOAL #2   Title Patient will increase AROM to WNL to increase ability to complete daily tasks above shoulder height with less difficulty.    Status On-going   OT LONG TERM GOAL #3   Title Patient will increase LUE strength to 4/5 to increase ability to hold grandchildren.   Status On-going   OT LONG TERM GOAL #4   Title Patient will decrease fascial restrictions to trace amount.   Status On-going   OT LONG TERM GOAL #5   Title Patient will decrease pain level to 2/10 or less when completing daily tasks.   Status On-going               Plan - 01/29/15 0929    Clinical Impression Statement A:  Patient is progressing well towards all OT goals.  Patient's primary deficity is lack of ability to reach overhead, which is limiting his ability to complete functional overhead activities.    Pt will benefit from skilled therapeutic intervention in order to improve on the following deficits (Retired) Decreased range of motion;Increased edema;Increased fascial restricitons;Pain;Impaired UE functional use;Decreased strength   OT Frequency 3x / week   OT Duration 8 weeks   OT Treatment/Interventions Self-care/ADL training;Therapeutic exercise;Patient/family education;Manual Therapy;Therapeutic activities;Cryotherapy;Electrical Stimulation;Moist Heat;Passive range of motion;Scar mobilization   Plan P:  Focus on functional activities to improve reaching overhead.  Do not complete strengthening of flexion in supine, secondary to not full AROM at this time.     Consulted and Agree with Plan of Care Patient          G-Codes - February 25, 2015 0931    Functional Assessment Tool Used FOTO 75% independent 25% limited    Functional Limitation Carrying, moving and handling objects   Carrying, Moving and Handling Objects Current Status [phone]) At least 20 percent but less than 40 percent impaired, limited or restricted   Carrying, Moving and Handling Objects Goal Status ([id number]) At least 1 percent but less than 20 percent impaired, limited or restricted      Problem List Patient Active Problem List   Diagnosis Date Noted  . Arthritis of knee, left 06/16/2013  . S/P total knee replacement using cement 06/14/2013  . Osteoarthritis of left knee 03/21/2013  . BPH (benign prostatic hyperplasia)   . GERD (gastroesophageal reflux disease)   . CAD (coronary artery disease)   . Fever 03/20/2013  . Altered mental state 03/20/2013  . Postoperative fever 03/20/2013  . PNA (pneumonia) 03/20/2013  . Preop cardiovascular exam 11/24/2012  . Peroneal tendonitis 10/05/2012  . Arthritis of foot, degenerative 10/05/2012  . HTN (hypertension) 09/25/2012  . Hyperlipidemia 09/25/2012  . Atrial fibrillation ([place]) 06/15/2012  . Long term (current) use of anticoagulants 06/15/2012  . Rotator cuff strain 02/04/2012  . Shoulder pain 02/04/2012  . Personal history of colonic polyps 02-25-11  . High risk medication use 02-25-2011    Vangie Bicker, OTR/L [phone]  25-Feb-2015, 9:36 AM  [place] [address] [place], [place], [address] Phone: [phone]   Fax:  [phone]  Name: LADAINIAN THERIEN MRN: [id number] Date of Birth: 11-Jan-1932  Occupational Therapy Progress Note  Dates of Reporting Period: 01/06/15 to 02-25-2015  Objective Reports of Subjective Statement: I cant reach overhead  Objective Measurements: see above  Goal Update: see above  Plan: Focus on functional activities to improve reaching  overhead.    Reason Skilled Services are Required: Requires manual therapy skills to facilitate increased A/PROM of flexion for functional reaching overhead.  Vangie Bicker, OTR/L [phone]

## 2015-01-31 ENCOUNTER — Ambulatory Visit (HOSPITAL_COMMUNITY): Payer: Medicare Other | Admitting: Specialist

## 2015-01-31 DIAGNOSIS — M629 Disorder of muscle, unspecified: Secondary | ICD-10-CM | POA: Diagnosis not present

## 2015-01-31 DIAGNOSIS — M25612 Stiffness of left shoulder, not elsewhere classified: Secondary | ICD-10-CM

## 2015-01-31 DIAGNOSIS — R29898 Other symptoms and signs involving the musculoskeletal system: Secondary | ICD-10-CM

## 2015-01-31 DIAGNOSIS — M25512 Pain in left shoulder: Secondary | ICD-10-CM

## 2015-01-31 DIAGNOSIS — M25522 Pain in left elbow: Secondary | ICD-10-CM | POA: Diagnosis not present

## 2015-01-31 NOTE — Therapy (Signed)
[place], [place], [address] Phone: [phone]   Fax:  [phone]  Occupational Therapy Treatment  Patient Details  Name: Jerry Cain MRN: [id number] Date of Birth: Mar 11, 1932 Referring Provider: Dr. Durward Fortes  Encounter Date: 01/31/2015      OT End of Session - 01/31/15 1040    Visit Number 30   Number of Visits 36   Date for OT Re-Evaluation 03/30/15  02/28/15   Authorization Type Medicare   Authorization Time Period before 39th visit - add kx modifier to charges going forward   Authorization - Visit Number 30   Authorization - Number of Visits 51   OT Start Time 0856   OT Stop Time 0934   OT Time Calculation (min) 38 min   Activity Tolerance No increased pain   Behavior During Therapy [facility] for tasks assessed/performed      Past Medical History  Diagnosis Date  . Hypercholesterolemia     takes Atorvastatin daily  . Gout   . BPH (benign prostatic hyperplasia)     takes Proscar daily  . CAD (coronary artery disease) 2009    cardiac cath and PTCA by Dr. Tami Ribas  . Tubular adenoma 2001  . Diverticulosis 2007    tcs by Dr. Laural Golden  . GERD (gastroesophageal reflux disease)   . Adenomatous colon polyp 2001  . Osteoarthritis     left knee  . Complication of anesthesia     hard to wake up  . Hypertension     takes Imdur,Coreg,and Lisinopril daily  . Atrial fibrillation ([place])     takes Coumadin daily  . Pneumonia     many,many yrs ago  . Numbness     right hand pointer and middle finger  . Carpal tunnel syndrome     right  . Joint pain   . Joint swelling   . Nocturia   . Hypothyroidism     takes SYnthroid daily  . Anxiety     Past Surgical History  Procedure Laterality Date  . Hernia repair  2009    left inguinal  . Cataract extraction      bilateral  . Colonoscopy  2007    Dr. Laural Golden- L sided diverticulosis. Next TCS 2012 due to h/o tubular adenoma.  . Colonoscopy   02/26/2011    Procedure: COLONOSCOPY;  Surgeon: Daneil Dolin, MD;  [facility];  Service: Endoscopy;  Laterality: N/A;  11:05  . Tee without cardioversion  12/12/2011    Procedure: TRANSESOPHAGEAL ECHOCARDIOGRAM (TEE);  Surgeon: Sanda Klein, MD;  Location: [facility] ENDOSCOPY;  Service: Cardiovascular;  Laterality: N/A;   successful/sinus rhythm  . Cardioversion  12/12/2011    Procedure: CARDIOVERSION;  Surgeon: Sanda Klein, MD;  Location: [place];  Service: Cardiovascular;  Laterality: N/A;  . Bunionectomy Right 03/17/2013    Procedure: Lillard Anes, Arthroplasty 2nd toe right foot;  Surgeon: Marcheta Grammes, DPM;  Location: [facility];  Service: Orthopedics;  Laterality: Right;  . Aiken osteotomy Right 03/17/2013    Procedure: Barbie Banner OSTEOTOMY;  Surgeon: Marcheta Grammes, DPM;  Location: [facility];  Service: Orthopedics;  Laterality: Right;  . Flexor tenotomy Left 03/17/2013    Procedure: PERCUTANEOUS FLEXOR TENOTOMY 3RD TOE LEFT FOOT;  Surgeon: Marcheta Grammes, DPM;  Location: [facility];  Service: Orthopedics;  Laterality: Left;  . Cardiac catheterization  06/11/2007    PTCA X2 in 06/2007:  2 overlapping Cypher stents to LAD (2.5x13 and 3.0x23)  . Cardiac  catheterization  06/21/2007    Cypher stent 2.5 x 13  to OM branch  . Coronary angioplasty      x 3   . Total knee arthroplasty Left 06/14/2013    Procedure: TOTAL KNEE ARTHROPLASTY;  Surgeon: Garald Balding, MD;  Location: [place];  Service: Orthopedics;  Laterality: Left;  . Rot Left     There were no vitals filed for this visit.  Visit Diagnosis:  Shoulder weakness  Pain in joint, shoulder region, left  Shoulder stiffness, left      Subjective Assessment - 01/31/15 0910    Subjective  S:  My grandson has been helping me stretch my arm.   Currently in Pain? Yes   Pain Score 2    Pain Location Shoulder   Pain Orientation Left   Pain Descriptors / Indicators Aching;Sore   Pain Type Acute pain             [facility] OT Assessment - 01/31/15 0001    Assessment   Diagnosis Left Shoulder RCR, SAD, DCR   Precautions   Precautions Shoulder   Type of Shoulder Precautions PROM for 4 weeks (until 12/07/14) then progress to AAROM (until 01/04/15). Progress to AROM and progress as tolerated.                  OT Treatments/Exercises (OP) - 01/31/15 0001    Exercises   Exercises Shoulder   Shoulder Exercises: Supine   Protraction PROM;5 reps;Strengthening;15 reps   Protraction Weight (lbs) 1   Horizontal ABduction PROM;5 reps;Strengthening;15 reps   Horizontal ABduction Weight (lbs) 1   External Rotation PROM;5 reps;Strengthening;15 reps   External Rotation Weight (lbs) 1   Internal Rotation PROM;5 reps;Strengthening;15 reps   Internal Rotation Weight (lbs) 1   Flexion PROM;5 reps;AROM;15 reps   ABduction PROM;5 reps;Strengthening;15 reps   Shoulder ABduction Weight (lbs) 1   Shoulder Exercises: ROM/Strengthening   UBE (Upper Arm Bike) 3' forward and 3' reverse at 1.0    Cybex Press 1 plate;15 reps   Cybex Row 1 plate;15 reps   Over Head Lace 2'   Proximal Shoulder Strengthening, Supine 10X with 1#   Other ROM/Strengthening Exercises graduated pinch tree able to reach to approximately 6" from the top of the vertical rod,    Other ROM/Strengthening Exercises sliding hand up and down pvc pipe positioned at 45 degrees to facilitate overhead reach needed for functional activities at home  therapist facilitated scapular rotation for improved shoulder movement completed 10 repetitions X 2 completed again for abduction 10 X 2 sets    Manual Therapy   Manual Therapy Myofascial release   Myofascial Release Myofascial release to left upper arm, trapezius, and scapularis region to decrease fascial restrictions and increase joint mobility in a pain free zone.  MFR to left elbow region to decrease elbow joint stiffness and pain.                   OT Short Term Goals - 01/19/15  0916    OT SHORT TERM GOAL #1   Title Patient will be education and independent with HEP.   Status On-going   OT SHORT TERM GOAL #2   Title Patient will increase PROM to [facility] to increase ability to get dressed with less difficulty.   Status On-going   OT SHORT TERM GOAL #3   Title Patient will increase strength to 3/5 to increase ability to complete daily tasks below shoulder height.    OT SHORT  TERM GOAL #4   Title Patient will decrease fascial restrictions from max to min amount.   OT SHORT TERM GOAL #5   Title Patient will decrease pain during daily tasks to 4/10.           OT Long Term Goals - 01/17/15 0944    OT LONG TERM GOAL #1   Title Patient will return to highest level of independence with all daily tasks using LUE.   Status On-going   OT LONG TERM GOAL #2   Title Patient will increase AROM to WNL to increase ability to complete daily tasks above shoulder height with less difficulty.    Status On-going   OT LONG TERM GOAL #3   Title Patient will increase LUE strength to 4/5 to increase ability to hold grandchildren.   Status On-going   OT LONG TERM GOAL #4   Title Patient will decrease fascial restrictions to trace amount.   Status On-going   OT LONG TERM GOAL #5   Title Patient will decrease pain level to 2/10 or less when completing daily tasks.   Status On-going               Plan - 01/31/15 1041    Clinical Impression Statement A:  Added abduction sliding on pole movement for increased end range of this movement.  Patient required vg and tactile cues for proper positioning.  Added cybex press and row for increased proximal shoulder strength that will allow for overhead reaching with greater shoulder stability.  Required vg to slow rate of exercise and focus on quality.   Plan P:  Increase independence with cybex press and row.  Continue to focus on functional activities to improve A/ROM and strength needed to achieve ability to reach overhead without  difficulty.        Problem List Patient Active Problem List   Diagnosis Date Noted  . Arthritis of knee, left 06/16/2013  . S/P total knee replacement using cement 06/14/2013  . Osteoarthritis of left knee 03/21/2013  . BPH (benign prostatic hyperplasia)   . GERD (gastroesophageal reflux disease)   . CAD (coronary artery disease)   . Fever 03/20/2013  . Altered mental state 03/20/2013  . Postoperative fever 03/20/2013  . PNA (pneumonia) 03/20/2013  . Preop cardiovascular exam 11/24/2012  . Peroneal tendonitis 10/05/2012  . Arthritis of foot, degenerative 10/05/2012  . HTN (hypertension) 09/25/2012  . Hyperlipidemia 09/25/2012  . Atrial fibrillation ([place]) 06/15/2012  . Long term (current) use of anticoagulants 06/15/2012  . Rotator cuff strain 02/04/2012  . Shoulder pain 02/04/2012  . Personal history of colonic polyps 01/29/2011  . High risk medication use 01/29/2011    Vangie Bicker, OTR/L [phone]  01/31/2015, 10:47 AM  [facility] [address] [place], [place], [address] Phone: [phone]   Fax:  [phone]  Name: INAKI VANTINE MRN: [id number] Date of Birth: [birth year]

## 2015-02-02 ENCOUNTER — Ambulatory Visit (HOSPITAL_COMMUNITY): Payer: Medicare Other | Admitting: Specialist

## 2015-02-02 DIAGNOSIS — M25612 Stiffness of left shoulder, not elsewhere classified: Secondary | ICD-10-CM

## 2015-02-02 DIAGNOSIS — R29898 Other symptoms and signs involving the musculoskeletal system: Secondary | ICD-10-CM

## 2015-02-02 DIAGNOSIS — M25512 Pain in left shoulder: Secondary | ICD-10-CM

## 2015-02-02 DIAGNOSIS — M629 Disorder of muscle, unspecified: Secondary | ICD-10-CM | POA: Diagnosis not present

## 2015-02-02 DIAGNOSIS — M25522 Pain in left elbow: Secondary | ICD-10-CM | POA: Diagnosis not present

## 2015-02-02 NOTE — Therapy (Signed)
[place], [place], [address] Phone: [phone]   Fax:  [phone]  Occupational Therapy Treatment  Patient Details  Name: Jerry Cain MRN: [id number] Date of Birth: 08-06-1931 Referring Provider: Dr. Durward Fortes  Encounter Date: 02/02/2015      OT End of Session - 02/02/15 0915    Visit Number 31   Number of Visits 36   Date for OT Re-Evaluation 03/30/15  mini reassess on 11/23   Authorization Type Medicare   Authorization Time Period before 39th visit - add kx modifier to charges going forward   Authorization - Visit Number 31   Authorization - Number of Visits 16   OT Start Time 0804   OT Stop Time 0847   OT Time Calculation (min) 43 min      Past Medical History  Diagnosis Date  . Hypercholesterolemia     takes Atorvastatin daily  . Gout   . BPH (benign prostatic hyperplasia)     takes Proscar daily  . CAD (coronary artery disease) 2009    cardiac cath and PTCA by Dr. Tami Ribas  . Tubular adenoma 2001  . Diverticulosis 2007    tcs by Dr. Laural Golden  . GERD (gastroesophageal reflux disease)   . Adenomatous colon polyp 2001  . Osteoarthritis     left knee  . Complication of anesthesia     hard to wake up  . Hypertension     takes Imdur,Coreg,and Lisinopril daily  . Atrial fibrillation ([place])     takes Coumadin daily  . Pneumonia     many,many yrs ago  . Numbness     right hand pointer and middle finger  . Carpal tunnel syndrome     right  . Joint pain   . Joint swelling   . Nocturia   . Hypothyroidism     takes SYnthroid daily  . Anxiety     Past Surgical History  Procedure Laterality Date  . Hernia repair  2009    left inguinal  . Cataract extraction      bilateral  . Colonoscopy  2007    Dr. Laural Golden- L sided diverticulosis. Next TCS 2012 due to h/o tubular adenoma.  . Colonoscopy  02/26/2011    Procedure: COLONOSCOPY;  Surgeon: Daneil Dolin, MD;  [facility];  Service: Endoscopy;  Laterality: N/A;  11:05  . Tee without cardioversion  12/12/2011    Procedure: TRANSESOPHAGEAL ECHOCARDIOGRAM (TEE);  Surgeon: Sanda Klein, MD;  Location: [facility] END[facility];  Service: Cardiovascular;  Laterality: N/A;   successful/sinus rhythm  . Cardioversion  12/12/2011    Procedure: CARDIOVERSION;  Surgeon: Sanda Klein, MD;  Location: [facility];  Service: Cardiovascular;  Laterality: N/A;  . Bunionectomy Right 03/17/2013    Procedure: Lillard Anes, Arthroplasty 2nd toe right foot;  Surgeon: Marcheta Grammes, DPM;  Location: [facility];  Service: Orthopedics;  Laterality: Right;  . Aiken osteotomy Right 03/17/2013    Procedure: Barbie Banner OSTEOTOMY;  Surgeon: Marcheta Grammes, DPM;  Location: [facility];  Service: Orthopedics;  Laterality: Right;  . Flexor tenotomy Left 03/17/2013    Procedure: PERCUTANEOUS FLEXOR TENOTOMY 3RD TOE LEFT FOOT;  Surgeon: Marcheta Grammes, DPM;  Location: [facility];  Service: Orthopedics;  Laterality: Left;  . Cardiac catheterization  06/11/2007    PTCA X2 in 06/2007:  2 overlapping Cypher stents to LAD (2.5x13 and 3.0x23)  . Cardiac catheterization  06/21/2007    Cypher stent 2.5 x 13  to  OM branch  . Coronary angioplasty      x 3   . Total knee arthroplasty Left 06/14/2013    Procedure: TOTAL KNEE ARTHROPLASTY;  Surgeon: Garald Balding, MD;  Location: [facility];  Service: Orthopedics;  Laterality: Left;  . Rot Left     There were no vitals filed for this visit.  Visit Diagnosis:  Shoulder weakness  Pain in joint, shoulder region, left  Shoulder stiffness, left      Subjective Assessment - 02/02/15 0819    Subjective  S:  I did my homework.  I can load my pistol without any difficulty.   Currently in Pain? Yes   Pain Score 2    Pain Location Shoulder   Pain Orientation Left   Pain Descriptors / Indicators Aching   Pain Type Acute pain            [facility] OT Assessment - 02/02/15 0001    Assessment   Diagnosis  Left Shoulder RCR, SAD, DCR   Onset Date 11/09/14   Precautions   Precautions Shoulder   Type of Shoulder Precautions PROM for 4 weeks (until 12/07/14) then progress to AAROM (until 01/04/15). Progress to AROM and progress as tolerated.                  OT Treatments/Exercises (OP) - 02/02/15 0001    Exercises   Exercises Shoulder   Shoulder Exercises: Supine   Protraction PROM;5 reps;Strengthening;15 reps   Protraction Weight (lbs) 1   Horizontal ABduction PROM;5 reps;Strengthening;15 reps   Horizontal ABduction Weight (lbs) 1   External Rotation PROM;5 reps;Strengthening;15 reps   External Rotation Weight (lbs) 1   Internal Rotation PROM;5 reps;Strengthening;15 reps   Internal Rotation Weight (lbs) 1   Flexion PROM;5 reps;AROM;15 reps   Shoulder Flexion Weight (lbs) 1   Flexion Limitations able to complete with weight through full available range   ABduction PROM;5 reps;Strengthening;15 reps   Shoulder ABduction Weight (lbs) 1   Shoulder Exercises: ROM/Strengthening   Cybex Press 1.5 plate;15 reps   Cybex Row 1.5 plate;15 reps   Over Head Lace 4 minutes    "W" Arms 10 times with mod verbal guidance for technique   X to V Arms 10 times with min verbal guidance for technique   Proximal Shoulder Strengthening, Supine 10X with 1#   Other ROM/Strengthening Exercises simulated holding rifle for skeet shooting with 5# dowel with 2# weight wrapped around distal end.  Paitent held dowel as he would a rifle with left hand outstretched on distal end and proximal end resting against his right shoulder.  Held this position 10 seconds X 5 - patietn reports increased soreness and max difficulty maintaining position.   Manual Therapy   Manual Therapy Myofascial release   Myofascial Release Myofascial release to left upper arm, trapezius, and scapularis region to decrease fascial restrictions and increase joint mobility in a pain free zone.  MFR to left elbow region to decrease elbow joint  stiffness and pain.                   OT Short Term Goals - 01/19/15 0916    OT SHORT TERM GOAL #1   Title Patient will be education and independent with HEP.   Status On-going   OT SHORT TERM GOAL #2   Title Patient will increase PROM to [facility] to increase ability to get dressed with less difficulty.   Status On-going   OT SHORT TERM GOAL #3  Title Patient will increase strength to 3/5 to increase ability to complete daily tasks below shoulder height.    OT SHORT TERM GOAL #4   Title Patient will decrease fascial restrictions from max to min amount.   OT SHORT TERM GOAL #5   Title Patient will decrease pain during daily tasks to 4/10.           OT Long Term Goals - 01/17/15 0944    OT LONG TERM GOAL #1   Title Patient will return to highest level of independence with all daily tasks using LUE.   Status On-going   OT LONG TERM GOAL #2   Title Patient will increase AROM to WNL to increase ability to complete daily tasks above shoulder height with less difficulty.    Status On-going   OT LONG TERM GOAL #3   Title Patient will increase LUE strength to 4/5 to increase ability to hold grandchildren.   Status On-going   OT LONG TERM GOAL #4   Title Patient will decrease fascial restrictions to trace amount.   Status On-going   OT LONG TERM GOAL #5   Title Patient will decrease pain level to 2/10 or less when completing daily tasks.   Status On-going               Plan - 02/02/15 0915    Clinical Impression Statement A:  Patient able to complete full available range of flexion with 1# resistance this date, demonstrating increased strength needed for reaching overhead.  Simulated holding rifle for skeet shooting with 7# dowel, patient able to maintain position for 10 seconds with moderate difficulty and increased soreness.   Plan P:  Patient will be able to hold simulated rifle in position for 20 seconds with min difficulty.        Problem List Patient Active  Problem List   Diagnosis Date Noted  . Arthritis of knee, left 06/16/2013  . S/P total knee replacement using cement 06/14/2013  . Osteoarthritis of left knee 03/21/2013  . BPH (benign prostatic hyperplasia)   . GERD (gastroesophageal reflux disease)   . CAD (coronary artery disease)   . Fever 03/20/2013  . Altered mental state 03/20/2013  . Postoperative fever 03/20/2013  . PNA (pneumonia) 03/20/2013  . Preop cardiovascular exam 11/24/2012  . Peroneal tendonitis 10/05/2012  . Arthritis of foot, degenerative 10/05/2012  . HTN (hypertension) 09/25/2012  . Hyperlipidemia 09/25/2012  . Atrial fibrillation ([place]) 06/15/2012  . Long term (current) use of anticoagulants 06/15/2012  . Rotator cuff strain 02/04/2012  . Shoulder pain 02/04/2012  . Personal history of colonic polyps 01/29/2011  . High risk medication use 01/29/2011    Vangie Bicker, OTR/L [phone]  02/02/2015, 9:18 AM  [place], [place], [address] Phone: [phone]   Fax:  [phone]  Name: DONNEY CARAVEO MRN: [id number] Date of Birth: 05-07-1931

## 2015-02-03 ENCOUNTER — Other Ambulatory Visit: Payer: Self-pay | Admitting: Cardiovascular Disease

## 2015-02-06 ENCOUNTER — Ambulatory Visit (HOSPITAL_COMMUNITY): Payer: Medicare Other | Attending: Orthopaedic Surgery | Admitting: Specialist

## 2015-02-06 DIAGNOSIS — R29898 Other symptoms and signs involving the musculoskeletal system: Secondary | ICD-10-CM

## 2015-02-06 DIAGNOSIS — M629 Disorder of muscle, unspecified: Secondary | ICD-10-CM | POA: Diagnosis not present

## 2015-02-06 DIAGNOSIS — M25512 Pain in left shoulder: Secondary | ICD-10-CM | POA: Diagnosis not present

## 2015-02-06 DIAGNOSIS — M25612 Stiffness of left shoulder, not elsewhere classified: Secondary | ICD-10-CM | POA: Insufficient documentation

## 2015-02-06 NOTE — Therapy (Signed)
[place], [place], [address] Phone: [phone]   Fax:  [phone]  Occupational Therapy Treatment  Patient Details  Name: Jerry Cain MRN: [id number] Date of Birth: 05/28/1931 Referring Provider: Dr. Durward Fortes  Encounter Date: 02/06/2015      OT End of Session - 02/06/15 1327    Visit Number 32   Number of Visits 36   Date for OT Re-Evaluation 03/30/15  mini reassess on 11/23   Authorization Type Medicare   Authorization Time Period before 39th visit - add kx modifier to charges going forward   Authorization - Visit Number 32   Authorization - Number of Visits 77   OT Start Time [phone]   OT Stop Time 1025   OT Time Calculation (min) 46 min   Activity Tolerance Patient tolerated treatment well   Behavior During Therapy [facility] for tasks assessed/performed      Past Medical History  Diagnosis Date  . Hypercholesterolemia     takes Atorvastatin daily  . Gout   . BPH (benign prostatic hyperplasia)     takes Proscar daily  . CAD (coronary artery disease) 2009    cardiac cath and PTCA by Dr. Tami Ribas  . Tubular adenoma 2001  . Diverticulosis 2007    tcs by Dr. Laural Golden  . GERD (gastroesophageal reflux disease)   . Adenomatous colon polyp 2001  . Osteoarthritis     left knee  . Complication of anesthesia     hard to wake up  . Hypertension     takes Imdur,Coreg,and Lisinopril daily  . Atrial fibrillation ([place])     takes Coumadin daily  . Pneumonia     many,many yrs ago  . Numbness     right hand pointer and middle finger  . Carpal tunnel syndrome     right  . Joint pain   . Joint swelling   . Nocturia   . Hypothyroidism     takes SYnthroid daily  . Anxiety     Past Surgical History  Procedure Laterality Date  . Hernia repair  2009    left inguinal  . Cataract extraction      bilateral  . Colonoscopy  2007    Dr. Laural Golden- L sided diverticulosis. Next TCS 2012 due to h/o tubular  adenoma.  . Colonoscopy  02/26/2011    Procedure: COLONOSCOPY;  Surgeon: Daneil Dolin, MD;  [facility];  Service: Endoscopy;  Laterality: N/A;  11:05  . Tee without cardioversion  12/12/2011    Procedure: TRANSESOPHAGEAL ECHOCARDIOGRAM (TEE);  Surgeon: Sanda Klein, MD;  Location: [facility] ENDOSCOPY;  Service: Cardiovascular;  Laterality: N/A;   successful/sinus rhythm  . Cardioversion  12/12/2011    Procedure: CARDIOVERSION;  Surgeon: Sanda Klein, MD;  Location: [facility];  Service: Cardiovascular;  Laterality: N/A;  . Bunionectomy Right 03/17/2013    Procedure: Lillard Anes, Arthroplasty 2nd toe right foot;  Surgeon: Marcheta Grammes, DPM;  [facility];  Service: Orthopedics;  Laterality: Right;  . Aiken osteotomy Right 03/17/2013    Procedure: Barbie Banner OSTEOTOMY;  Surgeon: Marcheta Grammes, DPM;  Location: [facility];  Service: Orthopedics;  Laterality: Right;  . Flexor tenotomy Left 03/17/2013    Procedure: PERCUTANEOUS FLEXOR TENOTOMY 3RD TOE LEFT FOOT;  Surgeon: Marcheta Grammes, DPM;  Location[facility];  Service: Orthopedics;  Laterality: Left;  . Cardiac catheterization  06/11/2007    PTCA X2 in 06/2007:  2 overlapping Cypher stents to LAD (2.5x13 and  3.0x23)  . Cardiac catheterization  06/21/2007    Cypher stent 2.5 x 13  to OM branch  . Coronary angioplasty      x 3   . Total knee arthroplasty Left 06/14/2013    Procedure: TOTAL KNEE ARTHROPLASTY;  Surgeon: Garald Balding, MD;  Location: [facility];  Service: Orthopedics;  Laterality: Left;  . Rot Left     There were no vitals filed for this visit.  Visit Diagnosis:  Shoulder weakness  Shoulder stiffness, left          [facility] OT Assessment - 02/06/15 0001    Assessment   Diagnosis Left Shoulder RCR, SAD, DCR   Precautions   Precautions Shoulder   Type of Shoulder Precautions PROM for 4 weeks (until 12/07/14) then progress to AAROM (until 01/04/15). Progress to AROM and progress as tolerated.    Restrictions   Weight Bearing Restrictions No                  OT Treatments/Exercises (OP) - 02/06/15 0001    Shoulder Exercises: Supine   Protraction PROM;5 reps;Strengthening;10 reps   Protraction Weight (lbs) 2   Horizontal ABduction PROM;5 reps;Strengthening;10 reps   Horizontal ABduction Weight (lbs) 2   External Rotation PROM;5 reps;Strengthening;10 reps   External Rotation Weight (lbs) 2   Internal Rotation PROM;5 reps;Strengthening;10 reps   Internal Rotation Weight (lbs) 2   Flexion PROM;5 reps;Strengthening;12 reps   Shoulder Flexion Weight (lbs) 1   ABduction PROM;5 reps;Strengthening;10 reps   Shoulder ABduction Weight (lbs) 2   Shoulder Exercises: Seated   Protraction Strengthening;10 reps   Protraction Weight (lbs) 1   Horizontal ABduction Strengthening;10 reps   Horizontal ABduction Weight (lbs) 1   External Rotation Strengthening;10 reps   External Rotation Weight (lbs) 1   Internal Rotation Strengthening;10 reps   Internal Rotation Weight (lbs) 1   Flexion Strengthening;10 reps   Flexion Weight (lbs) 1   Shoulder Exercises: Standing   Other Standing Exercises simulated holding rifle for skeet shooting with 5# dowel with 2# weight wrapped around distal end. Paitent held dowel as he would a rifle with left hand outstretched on distal end and proximal end resting against his right shoulder. Held this position 10 seconds X 3 - patient reports increased soreness and max difficulty maintaining position.completed 3 repetitions vs 5 at previsous session secondary to more fatigued at end of session   Shoulder Exercises: ROM/Strengthening   Cybex Press 1.5 plate;15 reps   Cybex Row 1.5 plate;15 reps   Over Head Lace 2' with 1# weight on arm   Proximal Shoulder Strengthening, Supine 10X with 1#   Other ROM/Strengthening Exercises graduated pinch tree able to reach to approximately 7" from the top of the vertical rod   Other ROM/Strengthening Exercises sliding  hand up and down pvc pipe positioned at 45 degrees to facilitate overhead reach needed for functional activities at home  therapist facilitated scapular rotation for improved shoulder movement completed 10 repetitions X 2 completed again for abduction 10 X 2 sets    Manual Therapy   Manual Therapy Myofascial release   Myofascial Release Myofascial release to left upper arm, trapezius, and scapularis region to decrease fascial restrictions and increase joint mobility in a pain free zone. MFR to left elbow region to decrease elbow joint stiffness and pain.                   OT Short Term Goals - 01/19/15 0916    OT  SHORT TERM GOAL #1   Title Patient will be education and independent with HEP.   Status On-going   OT SHORT TERM GOAL #2   Title Patient will increase PROM to [facility] to increase ability to get dressed with less difficulty.   Status On-going   OT SHORT TERM GOAL #3   Title Patient will increase strength to 3/5 to increase ability to complete daily tasks below shoulder height.    OT SHORT TERM GOAL #4   Title Patient will decrease fascial restrictions from max to min amount.   OT SHORT TERM GOAL #5   Title Patient will decrease pain during daily tasks to 4/10.           OT Long Term Goals - 01/17/15 0944    OT LONG TERM GOAL #1   Title Patient will return to highest level of independence with all daily tasks using LUE.   Status On-going   OT LONG TERM GOAL #2   Title Patient will increase AROM to WNL to increase ability to complete daily tasks above shoulder height with less difficulty.    Status On-going   OT LONG TERM GOAL #3   Title Patient will increase LUE strength to 4/5 to increase ability to hold grandchildren.   Status On-going   OT LONG TERM GOAL #4   Title Patient will decrease fascial restrictions to trace amount.   Status On-going   OT LONG TERM GOAL #5   Title Patient will decrease pain level to 2/10 or less when completing daily tasks.   Status  On-going               Plan - 02/06/15 1328    Clinical Impression Statement A:  Patient continues to be most limited in A/ROM and P/ROM into flexion which is affecting his ability to complete ADLs that involve reaching overhead.  Patient fatigue at a quicker rate with skeet shooting simulation - perhaps due to completing at the end vs the beginning of the session.     Plan P: Patient will be able to hold simulated rifle in position for 20 seconds with min difficulty.  Patient will be able to reach to 5" or less from top of graduated pinch tree, as evidence he is improving his functional ability to reach overhead and gaining A/ROM flexion.        Problem List Patient Active Problem List   Diagnosis Date Noted  . Arthritis of knee, left 06/16/2013  . S/P total knee replacement using cement 06/14/2013  . Osteoarthritis of left knee 03/21/2013  . BPH (benign prostatic hyperplasia)   . GERD (gastroesophageal reflux disease)   . CAD (coronary artery disease)   . Fever 03/20/2013  . Altered mental state 03/20/2013  . Postoperative fever 03/20/2013  . PNA (pneumonia) 03/20/2013  . Preop cardiovascular exam 11/24/2012  . Peroneal tendonitis 10/05/2012  . Arthritis of foot, degenerative 10/05/2012  . HTN (hypertension) 09/25/2012  . Hyperlipidemia 09/25/2012  . Atrial fibrillation ([place]) 06/15/2012  . Long term (current) use of anticoagulants 06/15/2012  . Rotator cuff strain 02/04/2012  . Shoulder pain 02/04/2012  . Personal history of colonic polyps 01/29/2011  . High risk medication use 01/29/2011    Vangie Bicker, OTR/L [phone]  02/06/2015, 1:31 PM  [place] [address] [place], [place], [address] Phone: [phone]   Fax:  [phone]  Name: HYMAN CROSSAN MRN: [id number] Date of Birth: [birth year]

## 2015-02-07 ENCOUNTER — Ambulatory Visit (INDEPENDENT_AMBULATORY_CARE_PROVIDER_SITE_OTHER): Payer: Medicare Other | Admitting: *Deleted

## 2015-02-07 DIAGNOSIS — Z7901 Long term (current) use of anticoagulants: Secondary | ICD-10-CM | POA: Diagnosis not present

## 2015-02-07 DIAGNOSIS — I4891 Unspecified atrial fibrillation: Secondary | ICD-10-CM

## 2015-02-07 LAB — POCT INR: INR: 2.2

## 2015-02-08 ENCOUNTER — Encounter (HOSPITAL_COMMUNITY): Payer: Self-pay

## 2015-02-08 ENCOUNTER — Ambulatory Visit (HOSPITAL_COMMUNITY): Payer: Medicare Other

## 2015-02-08 DIAGNOSIS — M629 Disorder of muscle, unspecified: Secondary | ICD-10-CM

## 2015-02-08 DIAGNOSIS — M25512 Pain in left shoulder: Secondary | ICD-10-CM

## 2015-02-08 DIAGNOSIS — M25612 Stiffness of left shoulder, not elsewhere classified: Secondary | ICD-10-CM | POA: Diagnosis not present

## 2015-02-08 DIAGNOSIS — M6289 Other specified disorders of muscle: Secondary | ICD-10-CM

## 2015-02-08 DIAGNOSIS — R29898 Other symptoms and signs involving the musculoskeletal system: Secondary | ICD-10-CM | POA: Diagnosis not present

## 2015-02-08 NOTE — Therapy (Signed)
[place], [place], [address] Phone: [phone]   Fax:  [phone]  Occupational Therapy Treatment  Patient Details  Name: Jerry Cain MRN: [id number] Date of Birth: September 14, 1931 Referring Provider: Dr. Durward Fortes  Encounter Date: 02/08/2015      OT End of Session - 02/08/15 0908    Visit Number 33   Number of Visits 36   Date for OT Re-Evaluation 03/30/15  mini reassess on 11/23   Authorization Type Medicare   Authorization Time Period before 39th visit - add kx modifier to charges going forward   Authorization - Visit Number 39   Authorization - Number of Visits 62   OT Start Time 0800   OT Stop Time 0845   OT Time Calculation (min) 45 min   Activity Tolerance Patient tolerated treatment well   Behavior During Therapy [facility] for tasks assessed/performed      Past Medical History  Diagnosis Date  . Hypercholesterolemia     takes Atorvastatin daily  . Gout   . BPH (benign prostatic hyperplasia)     takes Proscar daily  . CAD (coronary artery disease) 2009    cardiac cath and PTCA by Dr. Tami Ribas  . Tubular adenoma 2001  . Diverticulosis 2007    tcs by Dr. Laural Golden  . GERD (gastroesophageal reflux disease)   . Adenomatous colon polyp 2001  . Osteoarthritis     left knee  . Complication of anesthesia     hard to wake up  . Hypertension     takes Imdur,Coreg,and Lisinopril daily  . Atrial fibrillation ([place])     takes Coumadin daily  . Pneumonia     many,many yrs ago  . Numbness     right hand pointer and middle finger  . Carpal tunnel syndrome     right  . Joint pain   . Joint swelling   . Nocturia   . Hypothyroidism     takes SYnthroid daily  . Anxiety     Past Surgical History  Procedure Laterality Date  . Hernia repair  2009    left inguinal  . Cataract extraction      bilateral  . Colonoscopy  2007    Dr. Laural Golden- L sided diverticulosis. Next TCS 2012 due to h/o tubular  adenoma.  . Colonoscopy  02/26/2011    Procedure: COLONOSCOPY;  Surgeon: Daneil Dolin, MD;  [facility];  Service: Endoscopy;  Laterality: N/A;  11:05  . Tee without cardioversion  12/12/2011    Procedure: TRANSESOPHAGEAL ECHOCARDIOGRAM (TEE);  Surgeon: Sanda Klein, MD;  Location: [facility] ENDOSCOPY;  Service: Cardiovascular;  Laterality: N/A;   successful/sinus rhythm  . Cardioversion  12/12/2011    Procedure: CARDIOVERSION;  Surgeon: Sanda Klein, MD;  Location: [place];  Service: Cardiovascular;  Laterality: N/A;  . Bunionectomy Right 03/17/2013    Procedure: Lillard Anes, Arthroplasty 2nd toe right foot;  Surgeon: Marcheta Grammes, DPM;  Location: [facility];  Service: Orthopedics;  Laterality: Right;  . Aiken osteotomy Right 03/17/2013    Procedure: Barbie Banner OSTEOTOMY;  Surgeon: Marcheta Grammes, DPM;  Location: [facility];  Service: Orthopedics;  Laterality: Right;  . Flexor tenotomy Left 03/17/2013    Procedure: PERCUTANEOUS FLEXOR TENOTOMY 3RD TOE LEFT FOOT;  Surgeon: Marcheta Grammes, DPM;  Location: [facility];  Service: Orthopedics;  Laterality: Left;  . Cardiac catheterization  06/11/2007    PTCA X2 in 06/2007:  2 overlapping Cypher stents to LAD (2.5x13 and  3.0x23)  . Cardiac catheterization  06/21/2007    Cypher stent 2.5 x 13  to OM branch  . Coronary angioplasty      x 3   . Total knee arthroplasty Left 06/14/2013    Procedure: TOTAL KNEE ARTHROPLASTY;  Surgeon: Garald Balding, MD;  Location: [place];  Service: Orthopedics;  Laterality: Left;  . Rot Left     There were no vitals filed for this visit.  Visit Diagnosis:  Shoulder weakness  Shoulder stiffness, left  Pain in joint, shoulder region, left  Tight fascia      Subjective Assessment - 02/08/15 0834    Subjective  S: There are only two exercises that I do that I have trouble with.   Currently in Pain? Yes   Pain Score 2    Pain Location Shoulder   Pain Orientation Left   Pain  Descriptors / Indicators Sore   Pain Type Acute pain                      OT Treatments/Exercises (OP) - 02/08/15 0835    Exercises   Exercises Shoulder   Shoulder Exercises: Supine   Protraction PROM;5 reps;Strengthening;10 reps   Protraction Weight (lbs) 2   Horizontal ABduction PROM;5 reps;Strengthening;10 reps   Horizontal ABduction Weight (lbs) 2   External Rotation PROM;5 reps;Strengthening;10 reps   External Rotation Weight (lbs) 2   Internal Rotation PROM;5 reps;Strengthening;10 reps   Internal Rotation Weight (lbs) 2   Flexion PROM;5 reps;Strengthening;10 reps   Shoulder Flexion Weight (lbs) 2   ABduction PROM;5 reps;Strengthening;10 reps   Shoulder ABduction Weight (lbs) 2   Shoulder Exercises: ROM/Strengthening   Proximal Shoulder Strengthening, Supine 10X with 2#   Shoulder Exercises: Stretch   Wall Stretch - Flexion 3 reps;10 seconds   Wall Stretch - ABduction 3 reps;10 seconds   Manual Therapy   Manual Therapy Myofascial release;Muscle Energy Technique   Myofascial Release Myofascial release to left upper arm, trapezius, and scapularis region to decrease fascial restrictions and increase joint mobility in a pain free zone. MFR to left elbow region to decrease elbow joint stiffness and pain.    Muscle Energy Technique Muscle Energy technique completed to left anterior and medial deltoid to decrease muscle spasm and increase ROM.                 OT Education - 02/08/15 0908    Education provided Yes   Education Details Shoulder stretches   Person(s) Educated Patient   Methods Explanation;Demonstration;Handout   Comprehension Returned demonstration;Verbalized understanding          OT Short Term Goals - 01/19/15 0916    OT SHORT TERM GOAL #1   Title Patient will be education and independent with HEP.   Status On-going   OT SHORT TERM GOAL #2   Title Patient will increase PROM to [facility] to increase ability to get dressed with less  difficulty.   Status On-going   OT SHORT TERM GOAL #3   Title Patient will increase strength to 3/5 to increase ability to complete daily tasks below shoulder height.    OT SHORT TERM GOAL #4   Title Patient will decrease fascial restrictions from max to min amount.   OT SHORT TERM GOAL #5   Title Patient will decrease pain during daily tasks to 4/10.           OT Long Term Goals - 01/17/15 0944    OT LONG TERM GOAL #  1   Title Patient will return to highest level of independence with all daily tasks using LUE.   Status On-going   OT LONG TERM GOAL #2   Title Patient will increase AROM to WNL to increase ability to complete daily tasks above shoulder height with less difficulty.    Status On-going   OT LONG TERM GOAL #3   Title Patient will increase LUE strength to 4/5 to increase ability to hold grandchildren.   Status On-going   OT LONG TERM GOAL #4   Title Patient will decrease fascial restrictions to trace amount.   Status On-going   OT LONG TERM GOAL #5   Title Patient will decrease pain level to 2/10 or less when completing daily tasks.   Status On-going               Plan - 02/08/15 0908    Clinical Impression Statement A: Pt had great response to muscle energy technique this session with his P/ROM increasing.   Plan P: Patient will be able to hold simulated rifle in position for 20 seconds with min difficulty. Patient will be able to reach to 5" or less from top of graduated pinch tree, as evidence he is improving his functional ability to reach overhead and gaining A/ROM flexion.        Problem List Patient Active Problem List   Diagnosis Date Noted  . Arthritis of knee, left 06/16/2013  . S/P total knee replacement using cement 06/14/2013  . Osteoarthritis of left knee 03/21/2013  . BPH (benign prostatic hyperplasia)   . GERD (gastroesophageal reflux disease)   . CAD (coronary artery disease)   . Fever 03/20/2013  . Altered mental state 03/20/2013   . Postoperative fever 03/20/2013  . PNA (pneumonia) 03/20/2013  . Preop cardiovascular exam 11/24/2012  . Peroneal tendonitis 10/05/2012  . Arthritis of foot, degenerative 10/05/2012  . HTN (hypertension) 09/25/2012  . Hyperlipidemia 09/25/2012  . Atrial fibrillation ([place]) 06/15/2012  . Long term (current) use of anticoagulants 06/15/2012  . Rotator cuff strain 02/04/2012  . Shoulder pain 02/04/2012  . Personal history of colonic polyps 01/29/2011  . High risk medication use 01/29/2011    Ailene Ravel, OTR/L,CBIS  [phone]  02/08/2015, 9:18 AM  [facility] [address] [place], [place], [address] Phone: [phone]   Fax:  [phone]  Name: SOHUM DELILLO MRN: [id number] Date of Birth: 06-10-1931

## 2015-02-08 NOTE — Patient Instructions (Signed)
SHOULDER: Flexion At Wall    Slide both arms up wall while leaning gently into wall. Maintain upright posture and tuck in stomach. Hold _10__ seconds. 3___ reps per set, __3_ sets per day, __7_ days per week  Copyright  VHI. All rights reserved.   Closed Chain: Shoulder Abduction / Adduction - on Wall    One hand on wall, step to side and return. Stepping causes shoulder to abduct and adduct. Hold 10 seconds. Repeat 3 times. Complete 3 times a day.   http://ss.exer.us/266   Copyright  VHI. All rights reserved.

## 2015-02-09 ENCOUNTER — Other Ambulatory Visit: Payer: Self-pay | Admitting: Cardiovascular Disease

## 2015-02-12 ENCOUNTER — Ambulatory Visit (HOSPITAL_COMMUNITY): Payer: Medicare Other | Admitting: Specialist

## 2015-02-12 DIAGNOSIS — R29898 Other symptoms and signs involving the musculoskeletal system: Secondary | ICD-10-CM | POA: Diagnosis not present

## 2015-02-12 DIAGNOSIS — M629 Disorder of muscle, unspecified: Secondary | ICD-10-CM | POA: Diagnosis not present

## 2015-02-12 DIAGNOSIS — M25612 Stiffness of left shoulder, not elsewhere classified: Secondary | ICD-10-CM | POA: Diagnosis not present

## 2015-02-12 DIAGNOSIS — M25512 Pain in left shoulder: Secondary | ICD-10-CM | POA: Diagnosis not present

## 2015-02-12 NOTE — Therapy (Signed)
[place], [place], [address] Phone: [phone]   Fax:  [phone]  Occupational Therapy Treatment  Patient Details  Name: Jerry Cain MRN: [id number] Date of Birth: [birth year] Referring Provider: Dr. Durward Fortes  Encounter Date: 02/12/2015      OT End of Session - 02/12/15 1004    Visit Number 34   Number of Visits 36   Date for OT Re-Evaluation 03/30/15  mini reassess on 02/28/15   Authorization Type Medicare   Authorization Time Period before 39th visit - add kx modifier to charges going forward   Authorization - Visit Number 1   Authorization - Number of Visits 47   OT Start Time 0853   OT Stop Time 0936   OT Time Calculation (min) 43 min   Activity Tolerance Patient tolerated treatment well   Behavior During Therapy [facility] for tasks assessed/performed      Past Medical History  Diagnosis Date  . Hypercholesterolemia     takes Atorvastatin daily  . Gout   . BPH (benign prostatic hyperplasia)     takes Proscar daily  . CAD (coronary artery disease) 2009    cardiac cath and PTCA by Dr. Tami Ribas  . Tubular adenoma 2001  . Diverticulosis 2007    tcs by Dr. Laural Golden  . GERD (gastroesophageal reflux disease)   . Adenomatous colon polyp 2001  . Osteoarthritis     left knee  . Complication of anesthesia     hard to wake up  . Hypertension     takes Imdur,Coreg,and Lisinopril daily  . Atrial fibrillation ([place])     takes Coumadin daily  . Pneumonia     many,many yrs ago  . Numbness     right hand pointer and middle finger  . Carpal tunnel syndrome     right  . Joint pain   . Joint swelling   . Nocturia   . Hypothyroidism     takes SYnthroid daily  . Anxiety     Past Surgical History  Procedure Laterality Date  . Hernia repair  2009    left inguinal  . Cataract extraction      bilateral  . Colonoscopy  2007    Dr. Laural Golden- L sided diverticulosis. Next TCS 2012 due to h/o tubular  adenoma.  . Colonoscopy  02/26/2011    Procedure: COLONOSCOPY;  Surgeon: Daneil Dolin, MD;  [facility];  Service: Endoscopy;  Laterality: N/A;  11:05  . Tee without cardioversion  12/12/2011    Procedure: TRANSESOPHAGEAL ECHOCARDIOGRAM (TEE);  Surgeon: Sanda Klein, MD;  Location: [facility] ENDOSCOPY;  Service: Cardiovascular;  Laterality: N/A;   successful/sinus rhythm  . Cardioversion  12/12/2011    Procedure: CARDIOVERSION;  Surgeon: Sanda Klein, MD;  Location: [place];  Service: Cardiovascular;  Laterality: N/A;  . Bunionectomy Right 03/17/2013    Procedure: Lillard Anes, Arthroplasty 2nd toe right foot;  Surgeon: Marcheta Grammes, DPM;  [facility];  Service: Orthopedics;  Laterality: Right;  . Aiken osteotomy Right 03/17/2013    Procedure: Barbie Banner OSTEOTOMY;  Surgeon: Marcheta Grammes, DPM;  [facility];  Service: Orthopedics;  Laterality: Right;  . Flexor tenotomy Left 03/17/2013    Procedure: PERCUTANEOUS FLEXOR TENOTOMY 3RD TOE LEFT FOOT;  Surgeon: Marcheta Grammes, DPM;  [facility];  Service: Orthopedics;  Laterality: Left;  . Cardiac catheterization  06/11/2007    PTCA X2 in 06/2007:  2 overlapping Cypher stents to LAD (2.5x13 and  3.0x23)  . Cardiac catheterization  06/21/2007    Cypher stent 2.5 x 13  to OM branch  . Coronary angioplasty      x 3   . Total knee arthroplasty Left 06/14/2013    Procedure: TOTAL KNEE ARTHROPLASTY;  Surgeon: Garald Balding, MD;  Location: [place];  Service: Orthopedics;  Laterality: Left;  . Rot Left     There were no vitals filed for this visit.  Visit Diagnosis:  Shoulder weakness  Pain in joint, shoulder region, left  Shoulder stiffness, left      Subjective Assessment - 02/12/15 0856    Subjective  S:  The two exercises she gave me Friday are difficult   Currently in Pain? No/denies            [facility] OT Assessment - 02/12/15 0001    Assessment   Diagnosis Left Shoulder RCR, SAD, DCR    Precautions   Precautions Shoulder   Type of Shoulder Precautions PROM for 4 weeks (until 12/07/14) then progress to AAROM (until 01/04/15). Progress to AROM and progress as tolerated.                  OT Treatments/Exercises (OP) - 02/12/15 0001    Exercises   Exercises Shoulder   Shoulder Exercises: Supine   Protraction PROM;5 reps;Strengthening;15 reps   Protraction Weight (lbs) 2   Horizontal ABduction PROM;5 reps;Strengthening;15 reps   Horizontal ABduction Weight (lbs) 2   External Rotation PROM;5 reps;Strengthening;15 reps   External Rotation Weight (lbs) 2   Internal Rotation PROM;5 reps;Strengthening;15 reps   Internal Rotation Weight (lbs) 2   Flexion PROM;5 reps;Strengthening;15 reps   Shoulder Flexion Weight (lbs) 2   ABduction PROM;5 reps;Strengthening;15 reps   Shoulder ABduction Weight (lbs) 2   Shoulder Exercises: ROM/Strengthening   UBE (Upper Arm Bike) 3' forward and 3' reverse at 1.0    Cybex Press 2.5 plate;15 reps   Cybex Row 1.5 plate;15 reps   Wall Pushups 10 reps   Proximal Shoulder Strengthening, Supine 10X with 2#   Ball on Wall 1 minute with arm flexed and 1 min with arm abducted    Other ROM/Strengthening Exercises graduated pinch tree able to reach to approximately 4.5" from the top of the vertical rod with a visual goal used.   Manual Therapy   Manual Therapy Myofascial release   Myofascial Release Myofascial release to left upper arm, trapezius, and scapularis region to decrease fascial restrictions and increase joint mobility in a pain free zone. MFR to left elbow region to decrease elbow joint stiffness and pain.    Muscle Energy Technique Muscle Energy technique completed to left anterior and medial deltoid to decrease muscle spasm and increase ROM.                   OT Short Term Goals - 01/19/15 0916    OT SHORT TERM GOAL #1   Title Patient will be education and independent with HEP.   Status On-going   OT SHORT TERM  GOAL #2   Title Patient will increase PROM to [facility] to increase ability to get dressed with less difficulty.   Status On-going   OT SHORT TERM GOAL #3   Title Patient will increase strength to 3/5 to increase ability to complete daily tasks below shoulder height.    OT SHORT TERM GOAL #4   Title Patient will decrease fascial restrictions from max to min amount.   OT SHORT TERM GOAL #5  Title Patient will decrease pain during daily tasks to 4/10.           OT Long Term Goals - 01/17/15 0944    OT LONG TERM GOAL #1   Title Patient will return to highest level of independence with all daily tasks using LUE.   Status On-going   OT LONG TERM GOAL #2   Title Patient will increase AROM to WNL to increase ability to complete daily tasks above shoulder height with less difficulty.    Status On-going   OT LONG TERM GOAL #3   Title Patient will increase LUE strength to 4/5 to increase ability to hold grandchildren.   Status On-going   OT LONG TERM GOAL #4   Title Patient will decrease fascial restrictions to trace amount.   Status On-going   OT LONG TERM GOAL #5   Title Patient will decrease pain level to 2/10 or less when completing daily tasks.   Status On-going               Plan - 02/12/15 1013    Clinical Impression Statement A:  Completed muscle enegry technique with flexion this date with signficant improvment in P/ROM.  After MFR and MET able to reach to 4.5" from top of graduated pinch tree.   Plan P:  Continue to complete MET for improved P/ROM and A/ROM flexion in order to reach overhead with functional activiites.  Resume rifle simulation.        Problem List Patient Active Problem List   Diagnosis Date Noted  . Arthritis of knee, left 06/16/2013  . S/P total knee replacement using cement 06/14/2013  . Osteoarthritis of left knee 03/21/2013  . BPH (benign prostatic hyperplasia)   . GERD (gastroesophageal reflux disease)   . CAD (coronary artery disease)   .  Fever 03/20/2013  . Altered mental state 03/20/2013  . Postoperative fever 03/20/2013  . PNA (pneumonia) 03/20/2013  . Preop cardiovascular exam 11/24/2012  . Peroneal tendonitis 10/05/2012  . Arthritis of foot, degenerative 10/05/2012  . HTN (hypertension) 09/25/2012  . Hyperlipidemia 09/25/2012  . Atrial fibrillation ([place]) 06/15/2012  . Long term (current) use of anticoagulants 06/15/2012  . Rotator cuff strain 02/04/2012  . Shoulder pain 02/04/2012  . Personal history of colonic polyps 01/29/2011  . High risk medication use 01/29/2011    Vangie Bicker, OTR/L [phone]  02/12/2015, 10:18 AM  [facility] [place], [place], [address] Phone: [phone]   Fax:  [phone]  Name: Jerry Cain MRN: [id number] Date of Birth: Jul 26, 1931

## 2015-02-14 ENCOUNTER — Ambulatory Visit (HOSPITAL_COMMUNITY): Payer: Medicare Other

## 2015-02-14 ENCOUNTER — Encounter (HOSPITAL_COMMUNITY): Payer: Self-pay

## 2015-02-14 DIAGNOSIS — M629 Disorder of muscle, unspecified: Secondary | ICD-10-CM | POA: Diagnosis not present

## 2015-02-14 DIAGNOSIS — R29898 Other symptoms and signs involving the musculoskeletal system: Secondary | ICD-10-CM | POA: Diagnosis not present

## 2015-02-14 DIAGNOSIS — M25512 Pain in left shoulder: Secondary | ICD-10-CM | POA: Diagnosis not present

## 2015-02-14 DIAGNOSIS — M6289 Other specified disorders of muscle: Secondary | ICD-10-CM

## 2015-02-14 DIAGNOSIS — M25612 Stiffness of left shoulder, not elsewhere classified: Secondary | ICD-10-CM

## 2015-02-14 NOTE — Therapy (Signed)
[place], [place], [address] Phone: [phone]   Fax:  [phone]  Occupational Therapy Treatment  Patient Details  Name: Jerry Cain MRN: [id number] Date of Birth: 26-Nov-1931 Referring Provider: Dr. Durward Fortes  Encounter Date: 02/14/2015      OT End of Session - 02/14/15 0845    Visit Number 35   Number of Visits 36   Date for OT Re-Evaluation 03/30/15  mini reassess on 02/28/15   Authorization Type Medicare   Authorization Time Period before 39th visit - add kx modifier to charges going forward   Authorization - Visit Number 2   Authorization - Number of Visits 22   OT Start Time 0800   OT Stop Time 0845   OT Time Calculation (min) 45 min   Activity Tolerance Patient tolerated treatment well   Behavior During Therapy [facility] for tasks assessed/performed      Past Medical History  Diagnosis Date  . Hypercholesterolemia     takes Atorvastatin daily  . Gout   . BPH (benign prostatic hyperplasia)     takes Proscar daily  . CAD (coronary artery disease) 2009    cardiac cath and PTCA by Dr. Tami Ribas  . Tubular adenoma 2001  . Diverticulosis 2007    tcs by Dr. Laural Golden  . GERD (gastroesophageal reflux disease)   . Adenomatous colon polyp 2001  . Osteoarthritis     left knee  . Complication of anesthesia     hard to wake up  . Hypertension     takes Imdur,Coreg,and Lisinopril daily  . Atrial fibrillation ([place])     takes Coumadin daily  . Pneumonia     many,many yrs ago  . Numbness     right hand pointer and middle finger  . Carpal tunnel syndrome     right  . Joint pain   . Joint swelling   . Nocturia   . Hypothyroidism     takes SYnthroid daily  . Anxiety     Past Surgical History  Procedure Laterality Date  . Hernia repair  2009    left inguinal  . Cataract extraction      bilateral  . Colonoscopy  2007    Dr. Laural Golden- L sided diverticulosis. Next TCS 2012 due to h/o tubular  adenoma.  . Colonoscopy  02/26/2011    Procedure: COLONOSCOPY;  Surgeon: Daneil Dolin, MD;  [facility];  Service: Endoscopy;  Laterality: N/A;  11:05  . Tee without cardioversion  12/12/2011    Procedure: TRANSESOPHAGEAL ECHOCARDIOGRAM (TEE);  Surgeon: Sanda Klein, MD;  Location: [facility];  Service: Cardiovascular;  Laterality: N/A;   successful/sinus rhythm  . Cardioversion  12/12/2011    Procedure: CARDIOVERSION;  Surgeon: Sanda Klein, MD;  Location: [place];  Service: Cardiovascular;  Laterality: N/A;  . Bunionectomy Right 03/17/2013    Procedure: Lillard Anes, Arthroplasty 2nd toe right foot;  Surgeon: Marcheta Grammes, DPM;  Location: [facility];  Service: Orthopedics;  Laterality: Right;  . Aiken osteotomy Right 03/17/2013    Procedure: Barbie Banner OSTEOTOMY;  Surgeon: Marcheta Grammes, DPM;  Location: [facility];  Service: Orthopedics;  Laterality: Right;  . Flexor tenotomy Left 03/17/2013    Procedure: PERCUTANEOUS FLEXOR TENOTOMY 3RD TOE LEFT FOOT;  Surgeon: Marcheta Grammes, DPM;  Location: [facility];  Service: Orthopedics;  Laterality: Left;  . Cardiac catheterization  06/11/2007    PTCA X2 in 06/2007:  2 overlapping Cypher stents to LAD (2.5x13 and  3.0x23)  . Cardiac catheterization  06/21/2007    Cypher stent 2.5 x 13  to OM branch  . Coronary angioplasty      x 3   . Total knee arthroplasty Left 06/14/2013    Procedure: TOTAL KNEE ARTHROPLASTY;  Surgeon: Garald Balding, MD;  Location: [place];  Service: Orthopedics;  Laterality: Left;  . Rot Left     There were no vitals filed for this visit.  Visit Diagnosis:  Tight fascia  Shoulder weakness  Shoulder stiffness, left      Subjective Assessment - 02/14/15 0820    Subjective  S: If my elbow would not hurt I'd be ok.    Currently in Pain? No/denies            [facility] OT Assessment - 02/14/15 0815    Assessment   Diagnosis Left Shoulder RCR, SAD, DCR   Precautions   Precautions  Shoulder   Type of Shoulder Precautions PROM for 4 weeks (until 12/07/14) then progress to AAROM (until 01/04/15). Progress to AROM and progress as tolerated.                  OT Treatments/Exercises (OP) - 02/14/15 0816    Exercises   Exercises Shoulder   Shoulder Exercises: Supine   Protraction PROM;5 reps;Strengthening;15 reps   Protraction Weight (lbs) 2   Horizontal ABduction PROM;5 reps;Strengthening;15 reps   Horizontal ABduction Weight (lbs) 2   External Rotation PROM;5 reps;Strengthening;15 reps   External Rotation Weight (lbs) 2   Internal Rotation PROM;5 reps;Strengthening;15 reps   Internal Rotation Weight (lbs) 2   Flexion PROM;5 reps;Strengthening;15 reps   Shoulder Flexion Weight (lbs) 2   ABduction PROM;5 reps;Strengthening;15 reps   Shoulder ABduction Weight (lbs) 2   Shoulder Exercises: Seated   Protraction Strengthening;10 reps   Protraction Weight (lbs) 2   Horizontal ABduction Strengthening;10 reps   Horizontal ABduction Weight (lbs) 2   External Rotation Strengthening;10 reps   External Rotation Weight (lbs) 2   Internal Rotation Strengthening;10 reps   Internal Rotation Weight (lbs) 2   Flexion Strengthening;10 reps   Flexion Weight (lbs) 2   Abduction AROM;15 reps   Shoulder Exercises: ROM/Strengthening   UBE (Upper Arm Bike) 3' forward and 3' reverse at 1.0    Cybex Press 2.5 plate;15 reps   Cybex Row 15 reps;2.5 plate   Wall Pushups 10 reps   Proximal Shoulder Strengthening, Supine 1X with 2#   Proximal Shoulder Strengthening, Seated 15X no rest break   Ball on Wall 1 minute with arm flexed and 1 min with arm abducted    Other ROM/Strengthening Exercises Simulated rifle loading using green theraband; 20X   Manual Therapy   Manual Therapy Myofascial release;Muscle Energy Technique   Myofascial Release Myofascial release to left upper arm, trapezius, and scapularis region to decrease fascial restrictions and increase joint mobility in a pain  free zone.    Muscle Energy Technique Muscle Energy technique completed to left anterior and medial deltoid to decrease muscle spasm and increase ROM.                   OT Short Term Goals - 01/19/15 0916    OT SHORT TERM GOAL #1   Title Patient will be education and independent with HEP.   Status On-going   OT SHORT TERM GOAL #2   Title Patient will increase PROM to [facility] to increase ability to get dressed with less difficulty.   Status On-going  OT SHORT TERM GOAL #3   Title Patient will increase strength to 3/5 to increase ability to complete daily tasks below shoulder height.    OT SHORT TERM GOAL #4   Title Patient will decrease fascial restrictions from max to min amount.   OT SHORT TERM GOAL #5   Title Patient will decrease pain during daily tasks to 4/10.           OT Long Term Goals - 01/17/15 0944    OT LONG TERM GOAL #1   Title Patient will return to highest level of independence with all daily tasks using LUE.   Status On-going   OT LONG TERM GOAL #2   Title Patient will increase AROM to WNL to increase ability to complete daily tasks above shoulder height with less difficulty.    Status On-going   OT LONG TERM GOAL #3   Title Patient will increase LUE strength to 4/5 to increase ability to hold grandchildren.   Status On-going   OT LONG TERM GOAL #4   Title Patient will decrease fascial restrictions to trace amount.   Status On-going   OT LONG TERM GOAL #5   Title Patient will decrease pain level to 2/10 or less when completing daily tasks.   Status On-going               Plan - 02/14/15 0845    Clinical Impression Statement A: Patient continues to show great results with muscle energy technique although will continue to be limited with shoulder flexion due to requiring the need to keep elbow flexed during ROM due to arthritis pain.    Plan P: Cont with muscle energy technique and overhead heading activities.        Problem List Patient  Active Problem List   Diagnosis Date Noted  . Arthritis of knee, left 06/16/2013  . S/P total knee replacement using cement 06/14/2013  . Osteoarthritis of left knee 03/21/2013  . BPH (benign prostatic hyperplasia)   . GERD (gastroesophageal reflux disease)   . CAD (coronary artery disease)   . Fever 03/20/2013  . Altered mental state 03/20/2013  . Postoperative fever 03/20/2013  . PNA (pneumonia) 03/20/2013  . Preop cardiovascular exam 11/24/2012  . Peroneal tendonitis 10/05/2012  . Arthritis of foot, degenerative 10/05/2012  . HTN (hypertension) 09/25/2012  . Hyperlipidemia 09/25/2012  . Atrial fibrillation ([place]) 06/15/2012  . Long term (current) use of anticoagulants 06/15/2012  . Rotator cuff strain 02/04/2012  . Shoulder pain 02/04/2012  . Personal history of colonic polyps 01/29/2011  . High risk medication use 01/29/2011   Ailene Ravel, OTR/L,CBIS  [phone]  02/14/2015, 8:47 AM  [facility] [address] [place], [place], [address] Phone: [phone]   Fax:  [phone]  Name: QUEST TAVENNER MRN: [id number] Date of Birth: 08-23-1931

## 2015-02-15 ENCOUNTER — Ambulatory Visit (HOSPITAL_COMMUNITY): Payer: Medicare Other

## 2015-02-15 ENCOUNTER — Encounter (HOSPITAL_COMMUNITY): Payer: Self-pay

## 2015-02-15 DIAGNOSIS — M25512 Pain in left shoulder: Secondary | ICD-10-CM | POA: Diagnosis not present

## 2015-02-15 DIAGNOSIS — R29898 Other symptoms and signs involving the musculoskeletal system: Secondary | ICD-10-CM

## 2015-02-15 DIAGNOSIS — M6289 Other specified disorders of muscle: Secondary | ICD-10-CM

## 2015-02-15 DIAGNOSIS — M25612 Stiffness of left shoulder, not elsewhere classified: Secondary | ICD-10-CM

## 2015-02-15 DIAGNOSIS — M629 Disorder of muscle, unspecified: Secondary | ICD-10-CM

## 2015-02-15 NOTE — Therapy (Signed)
[place], [place], [address] Phone: [phone]   Fax:  [phone]  Occupational Therapy Treatment  Patient Details  Name: Jerry Cain MRN: [id number] Date of Birth: 05/22/1931 Referring Provider: Dr. Durward Fortes  Encounter Date: 02/15/2015      OT End of Session - 02/15/15 1013    Visit Number 36   Number of Visits 40   Date for OT Re-Evaluation 03/30/15  mini reassess on 02/28/15   Authorization Type Medicare   Authorization Time Period before 39th visit - add kx modifier to charges going forward   Authorization - Visit Number 30   Authorization - Number of Visits 44   OT Start Time 0800   OT Stop Time 0845   OT Time Calculation (min) 45 min   Activity Tolerance Patient tolerated treatment well   Behavior During Therapy [facility] for tasks assessed/performed      Past Medical History  Diagnosis Date  . Hypercholesterolemia     takes Atorvastatin daily  . Gout   . BPH (benign prostatic hyperplasia)     takes Proscar daily  . CAD (coronary artery disease) 2009    cardiac cath and PTCA by Dr. Tami Ribas  . Tubular adenoma 2001  . Diverticulosis 2007    tcs by Dr. Laural Golden  . GERD (gastroesophageal reflux disease)   . Adenomatous colon polyp 2001  . Osteoarthritis     left knee  . Complication of anesthesia     hard to wake up  . Hypertension     takes Imdur,Coreg,and Lisinopril daily  . Atrial fibrillation ([place])     takes Coumadin daily  . Pneumonia     many,many yrs ago  . Numbness     right hand pointer and middle finger  . Carpal tunnel syndrome     right  . Joint pain   . Joint swelling   . Nocturia   . Hypothyroidism     takes SYnthroid daily  . Anxiety     Past Surgical History  Procedure Laterality Date  . Hernia repair  2009    left inguinal  . Cataract extraction      bilateral  . Colonoscopy  2007    Dr. Laural Golden- L sided diverticulosis. Next TCS 2012 due to h/o tubular  adenoma.  . Colonoscopy  02/26/2011    Procedure: COLONOSCOPY;  Surgeon: Daneil Dolin, MD;  [facility];  Service: Endoscopy;  Laterality: N/A;  11:05  . Tee without cardioversion  12/12/2011    Procedure: TRANSESOPHAGEAL ECHOCARDIOGRAM (TEE);  Surgeon: Sanda Klein, MD;  Location: [facility] ENDOSCOPY;  Service: Cardiovascular;  Laterality: N/A;   successful/sinus rhythm  . Cardioversion  12/12/2011    Procedure: CARDIOVERSION;  Surgeon: Sanda Klein, MD;  Location: [place];  Service: Cardiovascular;  Laterality: N/A;  . Bunionectomy Right 03/17/2013    Procedure: Lillard Anes, Arthroplasty 2nd toe right foot;  Surgeon: Marcheta Grammes, DPM;  [facility];  Service: Orthopedics;  Laterality: Right;  . Aiken osteotomy Right 03/17/2013    Procedure: Barbie Banner OSTEOTOMY;  Surgeon: Marcheta Grammes, DPM;  Location: [facility];  Service: Orthopedics;  Laterality: Right;  . Flexor tenotomy Left 03/17/2013    Procedure: PERCUTANEOUS FLEXOR TENOTOMY 3RD TOE LEFT FOOT;  Surgeon: Marcheta Grammes, DPM;  Location: [facility];  Service: Orthopedics;  Laterality: Left;  . Cardiac catheterization  06/11/2007    PTCA X2 in 06/2007:  2 overlapping Cypher stents to LAD (2.5x13 and  3.0x23)  . Cardiac catheterization  06/21/2007    Cypher stent 2.5 x 13  to OM branch  . Coronary angioplasty      x 3   . Total knee arthroplasty Left 06/14/2013    Procedure: TOTAL KNEE ARTHROPLASTY;  Surgeon: Garald Balding, MD;  Location: [place];  Service: Orthopedics;  Laterality: Left;  . Rot Left     There were no vitals filed for this visit.  Visit Diagnosis:  Shoulder weakness  Shoulder stiffness, left  Tight fascia  Pain in joint, shoulder region, left      Subjective Assessment - 02/15/15 0826    Subjective  S: I'm a little sore today from yesterday.   Currently in Pain? Yes   Pain Score 2    Pain Location Shoulder   Pain Orientation Left   Pain Descriptors / Indicators Sore             [facility] OT Assessment - 02/15/15 0827    Assessment   Diagnosis Left Shoulder RCR, SAD, DCR   Precautions   Precautions Shoulder   Type of Shoulder Precautions PROM for 4 weeks (until 12/07/14) then progress to AAROM (until 01/04/15). Progress to AROM and progress as tolerated.                  OT Treatments/Exercises (OP) - 02/15/15 0827    Exercises   Exercises Shoulder   Shoulder Exercises: Supine   Protraction PROM;5 reps;Strengthening;15 reps   Protraction Weight (lbs) 2   Horizontal ABduction PROM;5 reps;Strengthening;15 reps   Horizontal ABduction Weight (lbs) 2   External Rotation PROM;5 reps;Strengthening;15 reps   External Rotation Weight (lbs) 2   Internal Rotation PROM;5 reps;Strengthening;15 reps   Internal Rotation Weight (lbs) 2   Flexion PROM;5 reps;Strengthening;15 reps   Shoulder Flexion Weight (lbs) 2   ABduction PROM;5 reps;Strengthening;15 reps   Shoulder ABduction Weight (lbs) 2   Shoulder Exercises: Seated   Protraction Strengthening;10 reps   Protraction Weight (lbs) 2   Horizontal ABduction Strengthening;10 reps   Horizontal ABduction Weight (lbs) 2   External Rotation Strengthening;10 reps   External Rotation Weight (lbs) 2   Internal Rotation Strengthening;10 reps   Internal Rotation Weight (lbs) 2   Flexion Strengthening;10 reps   Flexion Weight (lbs) 2   Abduction Strengthening;10 reps   ABduction Weight (lbs) 2   Shoulder Exercises: ROM/Strengthening   UBE (Upper Arm Bike) 3' forward and 3' reverse at 1.0    Cybex Press 2.5 plate;15 reps   Cybex Row 15 reps;2.5 plate   X to V Arms [id number]   Proximal Shoulder Strengthening, Supine 15X with 2#   Proximal Shoulder Strengthening, Seated 10X with 13   Manual Therapy   Manual Therapy Myofascial release;Muscle Energy Technique   Myofascial Release Myofascial release to left upper arm, trapezius, and scapularis region to decrease fascial restrictions and increase joint mobility in a  pain free zone.    Muscle Energy Technique Muscle Energy technique completed to left anterior and medial deltoid to decrease muscle spasm and increase ROM.                   OT Short Term Goals - 01/19/15 0916    OT SHORT TERM GOAL #1   Title Patient will be education and independent with HEP.   Status On-going   OT SHORT TERM GOAL #2   Title Patient will increase PROM to [facility] to increase ability to get dressed with less difficulty.  Status On-going   OT SHORT TERM GOAL #3   Title Patient will increase strength to 3/5 to increase ability to complete daily tasks below shoulder height.    OT SHORT TERM GOAL #4   Title Patient will decrease fascial restrictions from max to min amount.   OT SHORT TERM GOAL #5   Title Patient will decrease pain during daily tasks to 4/10.           OT Long Term Goals - 01/17/15 0944    OT LONG TERM GOAL #1   Title Patient will return to highest level of independence with all daily tasks using LUE.   Status On-going   OT LONG TERM GOAL #2   Title Patient will increase AROM to WNL to increase ability to complete daily tasks above shoulder height with less difficulty.    Status On-going   OT LONG TERM GOAL #3   Title Patient will increase LUE strength to 4/5 to increase ability to hold grandchildren.   Status On-going   OT LONG TERM GOAL #4   Title Patient will decrease fascial restrictions to trace amount.   Status On-going   OT LONG TERM GOAL #5   Title Patient will decrease pain level to 2/10 or less when completing daily tasks.   Status On-going               Plan - 02/15/15 1013    Clinical Impression Statement A: Pt reports feeling sore from therapy session yesterday. Pt did complete all exercises with min VC for form and technique. patient was able to complete seated abduction with weight for the first time today.   Plan P: Cont with muscle energy technique and overhead heading activities        Problem List Patient  Active Problem List   Diagnosis Date Noted  . Arthritis of knee, left 06/16/2013  . S/P total knee replacement using cement 06/14/2013  . Osteoarthritis of left knee 03/21/2013  . BPH (benign prostatic hyperplasia)   . GERD (gastroesophageal reflux disease)   . CAD (coronary artery disease)   . Fever 03/20/2013  . Altered mental state 03/20/2013  . Postoperative fever 03/20/2013  . PNA (pneumonia) 03/20/2013  . Preop cardiovascular exam 11/24/2012  . Peroneal tendonitis 10/05/2012  . Arthritis of foot, degenerative 10/05/2012  . HTN (hypertension) 09/25/2012  . Hyperlipidemia 09/25/2012  . Atrial fibrillation ([place]) 06/15/2012  . Long term (current) use of anticoagulants 06/15/2012  . Rotator cuff strain 02/04/2012  . Shoulder pain 02/04/2012  . Personal history of colonic polyps 01/29/2011  . High risk medication use 01/29/2011    Ailene Ravel, OTR/L,CBIS  [phone]  02/15/2015, 10:16 AM  [facility] [address] [place], [place], [address] Phone: [phone]   Fax:  [phone]  Name: Jerry Cain MRN: [id number] Date of Birth: 13-Aug-1931

## 2015-02-17 ENCOUNTER — Other Ambulatory Visit: Payer: Self-pay | Admitting: Internal Medicine

## 2015-02-19 ENCOUNTER — Ambulatory Visit (HOSPITAL_COMMUNITY): Payer: Medicare Other | Admitting: Specialist

## 2015-02-19 DIAGNOSIS — R29898 Other symptoms and signs involving the musculoskeletal system: Secondary | ICD-10-CM

## 2015-02-19 DIAGNOSIS — M6289 Other specified disorders of muscle: Secondary | ICD-10-CM

## 2015-02-19 DIAGNOSIS — M25612 Stiffness of left shoulder, not elsewhere classified: Secondary | ICD-10-CM

## 2015-02-19 DIAGNOSIS — M25512 Pain in left shoulder: Secondary | ICD-10-CM

## 2015-02-19 DIAGNOSIS — M629 Disorder of muscle, unspecified: Secondary | ICD-10-CM | POA: Diagnosis not present

## 2015-02-19 NOTE — Therapy (Signed)
[place], [place], [address] Phone: [phone]   Fax:  [phone]  Occupational Therapy Treatment  Patient Details  Name: Jerry Cain MRN: [id number] Date of Birth: 1932/02/25 Referring Provider: Dr. Durward Fortes  Encounter Date: 02/19/2015      OT End of Session - 02/19/15 0957    Visit Number 37   Number of Visits 40   Date for OT Re-Evaluation 03/30/15  mini reassess on 11/23   Authorization Type Medicare   Authorization Time Period before 39th visit - add kx modifier to charges going forward   Authorization - Visit Number 53   Authorization - Number of Visits 64   OT Start Time 0854   OT Stop Time 0936   OT Time Calculation (min) 42 min   Activity Tolerance Patient tolerated treatment well   Behavior During Therapy [facility] for tasks assessed/performed      Past Medical History  Diagnosis Date  . Hypercholesterolemia     takes Atorvastatin daily  . Gout   . BPH (benign prostatic hyperplasia)     takes Proscar daily  . CAD (coronary artery disease) 2009    cardiac cath and PTCA by Dr. Tami Ribas  . Tubular adenoma 2001  . Diverticulosis 2007    tcs by Dr. Laural Golden  . GERD (gastroesophageal reflux disease)   . Adenomatous colon polyp 2001  . Osteoarthritis     left knee  . Complication of anesthesia     hard to wake up  . Hypertension     takes Imdur,Coreg,and Lisinopril daily  . Atrial fibrillation ([place])     takes Coumadin daily  . Pneumonia     many,many yrs ago  . Numbness     right hand pointer and middle finger  . Carpal tunnel syndrome     right  . Joint pain   . Joint swelling   . Nocturia   . Hypothyroidism     takes SYnthroid daily  . Anxiety     Past Surgical History  Procedure Laterality Date  . Hernia repair  2009    left inguinal  . Cataract extraction      bilateral  . Colonoscopy  2007    Dr. Laural Golden- L sided diverticulosis. Next TCS 2012 due to h/o tubular  adenoma.  . Colonoscopy  02/26/2011    Procedure: COLONOSCOPY;  Surgeon: Daneil Dolin, MD;  [facility];  Service: Endoscopy;  Laterality: N/A;  11:05  . Tee without cardioversion  12/12/2011    Procedure: TRANSESOPHAGEAL ECHOCARDIOGRAM (TEE);  Surgeon: Sanda Klein, MD;  Location: [facility];  Service: Cardiovascular;  Laterality: N/A;   successful/sinus rhythm  . Cardioversion  12/12/2011    Procedure: CARDIOVERSION;  Surgeon: Sanda Klein, MD;  Location: [place];  Service: Cardiovascular;  Laterality: N/A;  . Bunionectomy Right 03/17/2013    Procedure: Lillard Anes, Arthroplasty 2nd toe right foot;  Surgeon: Marcheta Grammes, DPM;  Location: [facility];  Service: Orthopedics;  Laterality: Right;  . Aiken osteotomy Right 03/17/2013    Procedure: Barbie Banner OSTEOTOMY;  Surgeon: Marcheta Grammes, DPM;  [facility]: [facility];  Service: Orthopedics;  Laterality: Right;  . Flexor tenotomy Left 03/17/2013    Procedure: PERCUTANEOUS FLEXOR TENOTOMY 3RD TOE LEFT FOOT;  Surgeon: Marcheta Grammes, DPM;  [facility];  Service: Orthopedics;  Laterality: Left;  . Cardiac catheterization  06/11/2007    PTCA X2 in 06/2007:  2 overlapping Cypher stents to LAD (2.5x13 and  3.0x23)  . Cardiac catheterization  06/21/2007    Cypher stent 2.5 x 13  to OM branch  . Coronary angioplasty      x 3   . Total knee arthroplasty Left 06/14/2013    Procedure: TOTAL KNEE ARTHROPLASTY;  Surgeon: Garald Balding, MD;  Location: [place];  Service: Orthopedics;  Laterality: Left;  . Rot Left     There were no vitals filed for this visit.  Visit Diagnosis:  Shoulder weakness  Shoulder stiffness, left  Tight fascia  Pain in joint, shoulder region, left      Subjective Assessment - 02/19/15 0856    Subjective  S:  I go to the MD Wednesday.  I want to be able to reach over my head better than I do now.   Currently in Pain? No/denies            [facility] OT Assessment - 02/19/15 0001     Assessment   Diagnosis Left Shoulder RCR, SAD, DCR   Precautions   Precautions Shoulder   Type of Shoulder Precautions PROM for 4 weeks (until 12/07/14) then progress to AAROM (until 01/04/15). Progress to AROM and progress as tolerated.                  OT Treatments/Exercises (OP) - 02/19/15 0001    Exercises   Exercises Shoulder   Shoulder Exercises: Supine   Protraction PROM;5 reps;Strengthening;10 reps   Protraction Weight (lbs) 3   Horizontal ABduction PROM;5 reps;Strengthening;10 reps   Horizontal ABduction Weight (lbs) 3   External Rotation PROM;5 reps;Strengthening;10 reps   External Rotation Weight (lbs) 3   Internal Rotation PROM;5 reps;Strengthening;10 reps   Internal Rotation Weight (lbs) 3   Flexion PROM;5 reps;Strengthening;10 reps   Shoulder Flexion Weight (lbs) 3   ABduction PROM;5 reps;Strengthening;10 reps   Shoulder ABduction Weight (lbs) 3   Other Supine Exercises serratus anterior punch with 3# 10 times    Shoulder Exercises: Seated   Protraction Strengthening;10 reps   Protraction Weight (lbs) 2   Horizontal ABduction Strengthening;10 reps   Horizontal ABduction Weight (lbs) 2   External Rotation Strengthening;10 reps   External Rotation Weight (lbs) 2   Internal Rotation Strengthening;10 reps   Internal Rotation Weight (lbs) 2   Flexion Strengthening;10 reps   Flexion Weight (lbs) 2   Abduction Strengthening;10 reps   ABduction Weight (lbs) 2   Shoulder Exercises: ROM/Strengthening   UBE (Upper Arm Bike) 3' forward and 3' reverse at 2.0   Cybex Press 2.5 plate;10 reps   Cybex Row 2.5 plate;15 reps   Over Head Lace 2' with out weight to focus on overhead reach   "W" Arms 10 times at 50% range   X to V Arms 10 times with 2# through 50% range   Proximal Shoulder Strengthening, Supine 10 X with 3#   Manual Therapy   Manual Therapy Myofascial release;Muscle Energy Technique   Myofascial Release Myofascial release to left upper arm,  trapezius, and scapularis region to decrease fascial restrictions and increase joint mobility in a pain free zone.    Muscle Energy Technique Muscle Energy technique completed to left anterior and medial deltoid to decrease muscle spasm and increase ROM.                   OT Short Term Goals - 01/19/15 0916    OT SHORT TERM GOAL #1   Title Patient will be education and independent with HEP.   Status On-going  OT SHORT TERM GOAL #2   Title Patient will increase PROM to [facility] to increase ability to get dressed with less difficulty.   Status On-going   OT SHORT TERM GOAL #3   Title Patient will increase strength to 3/5 to increase ability to complete daily tasks below shoulder height.    OT SHORT TERM GOAL #4   Title Patient will decrease fascial restrictions from max to min amount.   OT SHORT TERM GOAL #5   Title Patient will decrease pain during daily tasks to 4/10.           OT Long Term Goals - 01/17/15 0944    OT LONG TERM GOAL #1   Title Patient will return to highest level of independence with all daily tasks using LUE.   Status On-going   OT LONG TERM GOAL #2   Title Patient will increase AROM to WNL to increase ability to complete daily tasks above shoulder height with less difficulty.    Status On-going   OT LONG TERM GOAL #3   Title Patient will increase LUE strength to 4/5 to increase ability to hold grandchildren.   Status On-going   OT LONG TERM GOAL #4   Title Patient will decrease fascial restrictions to trace amount.   Status On-going   OT LONG TERM GOAL #5   Title Patient will decrease pain level to 2/10 or less when completing daily tasks.   Status On-going               Plan - 02/19/15 0958    Clinical Impression Statement A:  Patient achieved greater P/ROM with muscle energy technique today.  Increased to 3# with supine strengthening which was a significant challenge for patient.  focused on proximal strengthening and overhead reach  activities.    Plan P:  Improve scapular stabilty and strength to good for increased ability to reach overhead into cabinet. reassess for MD visit on 11/16, update g code send 10 visit progress note        Problem List Patient Active Problem List   Diagnosis Date Noted  . Arthritis of knee, left 06/16/2013  . S/P total knee replacement using cement 06/14/2013  . Osteoarthritis of left knee 03/21/2013  . BPH (benign prostatic hyperplasia)   . GERD (gastroesophageal reflux disease)   . CAD (coronary artery disease)   . Fever 03/20/2013  . Altered mental state 03/20/2013  . Postoperative fever 03/20/2013  . PNA (pneumonia) 03/20/2013  . Preop cardiovascular exam 11/24/2012  . Peroneal tendonitis 10/05/2012  . Arthritis of foot, degenerative 10/05/2012  . HTN (hypertension) 09/25/2012  . Hyperlipidemia 09/25/2012  . Atrial fibrillation ([place]) 06/15/2012  . Long term (current) use of anticoagulants 06/15/2012  . Rotator cuff strain 02/04/2012  . Shoulder pain 02/04/2012  . Personal history of colonic polyps 01/29/2011  . High risk medication use 01/29/2011    Vangie Bicker, OTR/L [phone]  02/19/2015, 10:01 AM  [place] [address] [place], [place], [address] Phone: [phone]   Fax:  [phone]  Name: Jerry Cain MRN: [id number] Date of Birth: 22-Sep-1931

## 2015-02-21 ENCOUNTER — Encounter (HOSPITAL_COMMUNITY): Payer: Self-pay

## 2015-02-21 ENCOUNTER — Ambulatory Visit (HOSPITAL_COMMUNITY): Payer: Medicare Other

## 2015-02-21 DIAGNOSIS — R29898 Other symptoms and signs involving the musculoskeletal system: Secondary | ICD-10-CM | POA: Diagnosis not present

## 2015-02-21 DIAGNOSIS — M25612 Stiffness of left shoulder, not elsewhere classified: Secondary | ICD-10-CM | POA: Diagnosis not present

## 2015-02-21 DIAGNOSIS — M629 Disorder of muscle, unspecified: Secondary | ICD-10-CM

## 2015-02-21 DIAGNOSIS — M75122 Complete rotator cuff tear or rupture of left shoulder, not specified as traumatic: Secondary | ICD-10-CM | POA: Diagnosis not present

## 2015-02-21 DIAGNOSIS — M19022 Primary osteoarthritis, left elbow: Secondary | ICD-10-CM | POA: Diagnosis not present

## 2015-02-21 DIAGNOSIS — M25512 Pain in left shoulder: Secondary | ICD-10-CM | POA: Diagnosis not present

## 2015-02-21 DIAGNOSIS — M6289 Other specified disorders of muscle: Secondary | ICD-10-CM

## 2015-02-21 DIAGNOSIS — G5601 Carpal tunnel syndrome, right upper limb: Secondary | ICD-10-CM | POA: Diagnosis not present

## 2015-02-21 NOTE — Patient Instructions (Signed)
Strengthening: Chest Pull - Resisted   Hold Theraband in front of body with hands about shoulder width a part. Pull band a part and back together slowly. Repeat ____ times. Complete ____ set(s) per session.. Repeat ____ session(s) per day.  http://orth.exer.us/926   Copyright  VHI. All rights reserved.   PNF Strengthening: Resisted   Standing with resistive band around each hand, bring right arm up and away, thumb back. Repeat ____ times per set. Do ____ sets per session. Do ____ sessions per day.      Resisted External Rotation: in Neutral - Bilateral   Sit or stand, tubing in both hands, elbows at sides, bent to 90, forearms forward. Pinch shoulder blades together and rotate forearms out. Keep elbows at sides. Repeat ____ times per set. Do ____ sets per session. Do ____ sessions per day.  http://orth.exer.us/966   Copyright  VHI. All rights reserved.   PNF Strengthening: Resisted

## 2015-02-21 NOTE — Therapy (Signed)
[place], [place], [address] Phone: [phone]   Fax:  [phone]  Occupational Therapy Treatment and Mini reassessment  Patient Details  Name: Jerry Cain MRN: [id number] Date of Birth: 07/10/1931 Referring Provider: Dr. Durward Fortes  Encounter Date: 02/21/2015      OT End of Session - 02/21/15 1053    Visit Number 38   Number of Visits 32   Date for OT Re-Evaluation 03/30/15   Authorization Type Medicare   Authorization Time Period before 38th visit - add kx modifier to charges going forward   Authorization - Visit Number 56   Authorization - Number of Visits 26   OT Start Time 0800   OT Stop Time 0849   OT Time Calculation (min) 49 min   Activity Tolerance Patient tolerated treatment well   Behavior During Therapy [facility] for tasks assessed/performed      Past Medical History  Diagnosis Date  . Hypercholesterolemia     takes Atorvastatin daily  . Gout   . BPH (benign prostatic hyperplasia)     takes Proscar daily  . CAD (coronary artery disease) 2009    cardiac cath and PTCA by Dr. Tami Ribas  . Tubular adenoma 2001  . Diverticulosis 2007    tcs by Dr. Laural Golden  . GERD (gastroesophageal reflux disease)   . Adenomatous colon polyp 2001  . Osteoarthritis     left knee  . Complication of anesthesia     hard to wake up  . Hypertension     takes Imdur,Coreg,and Lisinopril daily  . Atrial fibrillation ([place])     takes Coumadin daily  . Pneumonia     many,many yrs ago  . Numbness     right hand pointer and middle finger  . Carpal tunnel syndrome     right  . Joint pain   . Joint swelling   . Nocturia   . Hypothyroidism     takes SYnthroid daily  . Anxiety     Past Surgical History  Procedure Laterality Date  . Hernia repair  2009    left inguinal  . Cataract extraction      bilateral  . Colonoscopy  2007    Dr. Laural Golden- L sided diverticulosis. Next TCS 2012 due to h/o tubular  adenoma.  . Colonoscopy  02/26/2011    Procedure: COLONOSCOPY;  Surgeon: Daneil Dolin, MD;  [facility];  Service: Endoscopy;  Laterality: N/A;  11:05  . Tee without cardioversion  12/12/2011    Procedure: TRANSESOPHAGEAL ECHOCARDIOGRAM (TEE);  Surgeon: Sanda Klein, MD;  [facility];  Service: Cardiovascular;  Laterality: N/A;   successful/sinus rhythm  . Cardioversion  12/12/2011    Procedure: CARDIOVERSION;  Surgeon: Sanda Klein, MD;  Location: [place];  Service: Cardiovascular;  Laterality: N/A;  . Bunionectomy Right 03/17/2013    Procedure: Lillard Anes, Arthroplasty 2nd toe right foot;  Surgeon: Marcheta Grammes, DPM;  [facility];  Service: Orthopedics;  Laterality: Right;  . Aiken osteotomy Right 03/17/2013    Procedure: Barbie Banner OSTEOTOMY;  Surgeon: Marcheta Grammes, DPM;  [facility];  Service: Orthopedics;  Laterality: Right;  . Flexor tenotomy Left 03/17/2013    Procedure: PERCUTANEOUS FLEXOR TENOTOMY 3RD TOE LEFT FOOT;  Surgeon: Marcheta Grammes, DPM;  [facility];  Service: Orthopedics;  Laterality: Left;  . Cardiac catheterization  06/11/2007    PTCA X2 in 06/2007:  2 overlapping Cypher stents to LAD (2.5x13 and 3.0x23)  .  Cardiac catheterization  06/21/2007    Cypher stent 2.5 x 13  to OM branch  . Coronary angioplasty      x 3   . Total knee arthroplasty Left 06/14/2013    Procedure: TOTAL KNEE ARTHROPLASTY;  Surgeon: Garald Balding, MD;  Location: [place];  Service: Orthopedics;  Laterality: Left;  . Rot Left     There were no vitals filed for this visit.  Visit Diagnosis:  Shoulder weakness  Shoulder stiffness, left  Tight fascia      Subjective Assessment - 02/21/15 1051    Subjective  S: My shoulder is doing great but my elbow and right wrist are just giving me a fit.   Special Tests FOTO score: 71/100   Currently in Pain? No/denies            [facility] OT Assessment - 02/21/15 0815     Assessment   Diagnosis Left Shoulder RCR, SAD, DCR   Precautions   Precautions Shoulder   Type of Shoulder Precautions PROM for 4 weeks (until 12/07/14) then progress to AAROM (until 01/04/15). Progress to AROM and progress as tolerated.   AROM   Overall AROM Comments Assessed seated. IR/er adducted.   AROM Assessment Site Shoulder   Right/Left Shoulder Left   Left Shoulder Flexion 118 Degrees  previous: 100   Left Shoulder ABduction 102 Degrees  previous: 75   Left Shoulder Internal Rotation 90 Degrees  previous: 85   Left Shoulder External Rotation 70 Degrees  previous: 65   PROM   Overall PROM Comments Assessed supine. IR/er adducted.   PROM Assessment Site Shoulder   Right/Left Shoulder Left   Left Shoulder Flexion 140 Degrees  previous: 150   Left Shoulder ABduction 180 Degrees  previous: 180   Left Shoulder Internal Rotation 90 Degrees  previous: 90   Left Shoulder External Rotation 90 Degrees  previoius: 80   Strength   Overall Strength Comments Assessed seated, er/IR adducted   Strength Assessment Site Shoulder   Right/Left Shoulder Left   Left Shoulder Flexion 4+/5  previous: 3+/5   Left Shoulder ABduction 4-/5  previous: 3/5   Left Shoulder Internal Rotation 4/5  Previous: 4-/5   Left Shoulder External Rotation 3+/5  previous: 3+/5                  OT Treatments/Exercises (OP) - 02/21/15 0831    Exercises   Exercises Shoulder   Shoulder Exercises: Seated   Horizontal ABduction Theraband;10 reps   Theraband Level (Shoulder Horizontal ABduction) Level 2 (Red)   External Rotation Theraband;10 reps   Theraband Level (Shoulder External Rotation) Level 2 (Red)   Internal Rotation Theraband;10 reps   Theraband Level (Shoulder Internal Rotation) Level 2 (Red)   Abduction Theraband;10 reps   Theraband Level (Shoulder ABduction) Level 2 (Red)   Shoulder Exercises: ROM/Strengthening   UBE (Upper Arm Bike) 3' forward and 3' reverse at 2.0   Manual  Therapy   Manual Therapy Myofascial release;Muscle Energy Technique   Manual therapy comments Manual therapy completed prior to ROM measurements and treatment exercises.   Myofascial Release Myofascial release to left upper arm, trapezius, and scapularis region to decrease fascial restrictions and increase joint mobility in a pain free zone.    Muscle Energy Technique Muscle Energy technique completed to left anterior and medial deltoid to decrease muscle spasm and increase ROM.                 OT Education - 02/21/15  1052    Education provided Yes   Education Details red theraband strengthening exercises   Person(s) Educated Patient   Methods Explanation;Demonstration;Handout   Comprehension Returned demonstration;Verbalized understanding          OT Short Term Goals - 2015-03-01 0838    OT SHORT TERM GOAL #1   Title Patient will be education and independent with HEP.   Status Achieved   OT SHORT TERM GOAL #2   Title Patient will increase PROM to [facility] to increase ability to get dressed with less difficulty.   Status Achieved   OT SHORT TERM GOAL #3   Title Patient will increase strength to 3/5 to increase ability to complete daily tasks below shoulder height.    OT SHORT TERM GOAL #4   Title Patient will decrease fascial restrictions from max to min amount.   OT SHORT TERM GOAL #5   Title Patient will decrease pain during daily tasks to 4/10.           OT Long Term Goals - Mar 01, 2015 3009    OT LONG TERM GOAL #1   Title Patient will return to highest level of independence with all daily tasks using LUE.   Status On-going   OT LONG TERM GOAL #2   Title Patient will increase AROM to WNL to increase ability to complete daily tasks above shoulder height with less difficulty.    Status On-going   OT LONG TERM GOAL #3   Title Patient will increase LUE strength to 4/5 to increase ability to hold grandchildren.   Status Partially Met   OT LONG TERM GOAL #4   Title Patient  will decrease fascial restrictions to trace amount.   Status On-going   OT LONG TERM GOAL #5   Title Patient will decrease pain level to 2/10 or less when completing daily tasks.   Status Achieved               Plan - 2015/03/01 1055    Clinical Impression Statement A: Mini reassessment completed this date. patient has made progress with ROM measurements with the exception of P/ROM shoulder flexion. Patient has met all short term goals and 1/5 LTGs. pt reports that he is using his arm more for daily activities and he is trying to get his shoulder stronger since he is unable to use his right wrist for much due to his carpal tunnel   Plan P: Continue therapy 2X a week for 4 more weeks to continue working on ROM of Left shoulder and strengthening and scapular stability.           G-Codes - 03-01-2015 1058    Functional Assessment Tool Used FOTO score: 71/100 (29% impaired)   Functional Limitation Carrying, moving and handling objects   Carrying, Moving and Handling Objects Current Status (Q3300) At least 20 percent but less than 40 percent impaired, limited or restricted   Carrying, Moving and Handling Objects Goal Status (T6226) At least 1 percent but less than 20 percent impaired, limited or restricted      Problem List Patient Active Problem List   Diagnosis Date Noted  . Arthritis of knee, left 06/16/2013  . S/P total knee replacement using cement 06/14/2013  . Osteoarthritis of left knee 03/21/2013  . BPH (benign prostatic hyperplasia)   . GERD (gastroesophageal reflux disease)   . CAD (coronary artery disease)   . Fever 03/20/2013  . Altered mental state 03/20/2013  . Postoperative fever 03/20/2013  . PNA (pneumonia) 03/20/2013  .  Preop cardiovascular exam 11/24/2012  . Peroneal tendonitis 10/05/2012  . Arthritis of foot, degenerative 10/05/2012  . HTN (hypertension) 09/25/2012  . Hyperlipidemia 09/25/2012  . Atrial fibrillation ([place]) 06/15/2012  . Long term (current)  use of anticoagulants 06/15/2012  . Rotator cuff strain 02/04/2012  . Shoulder pain 02/04/2012  . Personal history of colonic polyps 01/29/2011  . High risk medication use 01/29/2011  Occupational Therapy Progress Note  Dates of Reporting Period: 01/24/15 to 02/21/15  Objective Reports of Subjective Statement: See above  Objective Measurements: See above  Goal Update: See above  Plan: See above  Reason Skilled Services are Required: Skilled OT services required to continue increasing ROM and strength needed to complete daily tasks    Ailene Ravel, OTR/L,CBIS  [phone]  02/21/2015, 11:31 AM  [place], [place], [address] Phone: [phone]   Fax:  [phone]  Name: Jerry Cain MRN: [id number] Date of Birth: 11-16-1931

## 2015-02-22 DIAGNOSIS — L309 Dermatitis, unspecified: Secondary | ICD-10-CM | POA: Diagnosis not present

## 2015-02-23 ENCOUNTER — Ambulatory Visit (HOSPITAL_COMMUNITY): Payer: Medicare Other

## 2015-02-23 ENCOUNTER — Encounter (HOSPITAL_COMMUNITY): Payer: Self-pay

## 2015-02-23 DIAGNOSIS — M25612 Stiffness of left shoulder, not elsewhere classified: Secondary | ICD-10-CM | POA: Diagnosis not present

## 2015-02-23 DIAGNOSIS — M25512 Pain in left shoulder: Secondary | ICD-10-CM | POA: Diagnosis not present

## 2015-02-23 DIAGNOSIS — M6289 Other specified disorders of muscle: Secondary | ICD-10-CM

## 2015-02-23 DIAGNOSIS — M629 Disorder of muscle, unspecified: Secondary | ICD-10-CM

## 2015-02-23 DIAGNOSIS — R29898 Other symptoms and signs involving the musculoskeletal system: Secondary | ICD-10-CM | POA: Diagnosis not present

## 2015-02-23 NOTE — Therapy (Signed)
[place], [place], [address] Phone: [phone]   Fax:  [phone]  Occupational Therapy Treatment  Patient Details  Name: Jerry Cain MRN: [id number] Date of Birth: [birth year] Referring Provider: Dr. Durward Fortes  Encounter Date: 02/23/2015      OT End of Session - 02/23/15 0919    Visit Number 39   Number of Visits 15   Date for OT Re-Evaluation 03/30/15   Authorization Type Medicare   Authorization Time Period before 48th visit - add kx modifier to charges going forward   Authorization - Visit Number 70   Authorization - Number of Visits 71   OT Start Time 0803   OT Stop Time 0845   OT Time Calculation (min) 42 min   Activity Tolerance Patient tolerated treatment well   Behavior During Therapy [facility] for tasks assessed/performed      Past Medical History  Diagnosis Date  . Hypercholesterolemia     takes Atorvastatin daily  . Gout   . BPH (benign prostatic hyperplasia)     takes Proscar daily  . CAD (coronary artery disease) 2009    cardiac cath and PTCA by Dr. Tami Ribas  . Tubular adenoma 2001  . Diverticulosis 2007    tcs by Dr. Laural Golden  . GERD (gastroesophageal reflux disease)   . Adenomatous colon polyp 2001  . Osteoarthritis     left knee  . Complication of anesthesia     hard to wake up  . Hypertension     takes Imdur,Coreg,and Lisinopril daily  . Atrial fibrillation ([place])     takes Coumadin daily  . Pneumonia     many,many yrs ago  . Numbness     right hand pointer and middle finger  . Carpal tunnel syndrome     right  . Joint pain   . Joint swelling   . Nocturia   . Hypothyroidism     takes SYnthroid daily  . Anxiety     Past Surgical History  Procedure Laterality Date  . Hernia repair  2009    left inguinal  . Cataract extraction      bilateral  . Colonoscopy  2007    Dr. Laural Golden- L sided diverticulosis. Next TCS 2012 due to h/o tubular adenoma.  . Colonoscopy   02/26/2011    Procedure: COLONOSCOPY;  Surgeon: Daneil Dolin, MD;  [facility];  Service: Endoscopy;  Laterality: N/A;  11:05  . Tee without cardioversion  12/12/2011    Procedure: TRANSESOPHAGEAL ECHOCARDIOGRAM (TEE);  Surgeon: Sanda Klein, MD;  [facility]: [facility] ENDOSCOPY;  Service: Cardiovascular;  Laterality: N/A;   successful/sinus rhythm  . Cardioversion  12/12/2011    Procedure: CARDIOVERSION;  Surgeon: Sanda Klein, MD;  Location: [place];  Service: Cardiovascular;  Laterality: N/A;  . Bunionectomy Right 03/17/2013    Procedure: Lillard Anes, Arthroplasty 2nd toe right foot;  Surgeon: Marcheta Grammes, DPM;  Location: [facility];  Service: Orthopedics;  Laterality: Right;  . Aiken osteotomy Right 03/17/2013    Procedure: Barbie Banner OSTEOTOMY;  Surgeon: Marcheta Grammes, DPM;  Location: [facility];  Service: Orthopedics;  Laterality: Right;  . Flexor tenotomy Left 03/17/2013    Procedure: PERCUTANEOUS FLEXOR TENOTOMY 3RD TOE LEFT FOOT;  Surgeon: Marcheta Grammes, DPM;  Location: [facility];  Service: Orthopedics;  Laterality: Left;  . Cardiac catheterization  06/11/2007    PTCA X2 in 06/2007:  2 overlapping Cypher stents to LAD (2.5x13 and 3.0x23)  . Cardiac catheterization  06/21/2007    Cypher stent 2.5 x 13  to OM branch  . Coronary angioplasty      x 3   . Total knee arthroplasty Left 06/14/2013    Procedure: TOTAL KNEE ARTHROPLASTY;  Surgeon: Garald Balding, MD;  Location: [facility];  Service: Orthopedics;  Laterality: Left;  . Rot Left     There were no vitals filed for this visit.  Visit Diagnosis:  Shoulder weakness  Shoulder stiffness, left  Tight fascia  Pain in joint, shoulder region, left      Subjective Assessment - 02/23/15 0822    Subjective  S: The doctor said that the strength in my shoulder is going to take a while to come back.    Currently in Pain? Yes   Pain Score 4    Pain Location Shoulder   Pain Orientation Left   Pain Descriptors  / Indicators Sore   Pain Type Acute pain            [facility] OT Assessment - 02/23/15 0823    Assessment   Diagnosis Left Shoulder RCR, SAD, DCR   Precautions   Precautions Shoulder   Type of Shoulder Precautions PROM for 4 weeks (until 12/07/14) then progress to AAROM (until 01/04/15). Progress to AROM and progress as tolerated.                  OT Treatments/Exercises (OP) - 02/23/15 0823    Exercises   Exercises Shoulder   Shoulder Exercises: Supine   Protraction PROM;5 reps;Strengthening;12 reps   Protraction Weight (lbs) 3   Horizontal ABduction PROM;5 reps;Strengthening;12 reps   Horizontal ABduction Weight (lbs) 3   External Rotation PROM;5 reps;Strengthening;12 reps   External Rotation Weight (lbs) 3   Internal Rotation PROM;5 reps;Strengthening;12 reps   Internal Rotation Weight (lbs) 3   Flexion PROM;5 reps;Strengthening;12 reps   Shoulder Flexion Weight (lbs) 3   ABduction PROM;5 reps;Strengthening;12 reps   Shoulder ABduction Weight (lbs) 3   Shoulder Exercises: Seated   Protraction Strengthening;10 reps   Protraction Weight (lbs) 2   Horizontal ABduction Strengthening;10 reps   Horizontal ABduction Weight (lbs) 2   External Rotation Strengthening;10 reps   External Rotation Weight (lbs) 2   Internal Rotation Strengthening;10 reps   Internal Rotation Weight (lbs) 2   Flexion Strengthening;10 reps   Flexion Weight (lbs) 2   Abduction Strengthening;10 reps   ABduction Weight (lbs) 2   Shoulder Exercises: ROM/Strengthening   UBE (Upper Arm Bike) 3' forward and 3' reverse at 2.0   Cybex Press 2 plate;20 reps   Cybex Row 2.5 plate;20 reps   Over Head Lace 2' with out weight to focus on overhead reach   X to V Arms 10 times with 2# through 50% range   Proximal Shoulder Strengthening, Supine 12X with 3#   Proximal Shoulder Strengthening, Seated 10X with 2#   Manual Therapy   Manual Therapy Myofascial release;Muscle Energy Technique   Manual therapy  comments Manual therapy completed prior to ROM measurements and treatment exercises.   Myofascial Release Myofascial release to left upper arm, trapezius, and scapularis region to decrease fascial restrictions and increase joint mobility in a pain free zone.    Muscle Energy Technique Muscle Energy technique completed to left anterior and medial deltoid to decrease muscle spasm and increase ROM.                   OT Short Term Goals - 02/23/15 0921    OT  SHORT TERM GOAL #1   Title Patient will be education and independent with HEP.   OT SHORT TERM GOAL #2   Title Patient will increase PROM to [facility] to increase ability to get dressed with less difficulty.   OT SHORT TERM GOAL #3   Title Patient will increase strength to 3/5 to increase ability to complete daily tasks below shoulder height.    OT SHORT TERM GOAL #4   Title Patient will decrease fascial restrictions from max to min amount.   OT SHORT TERM GOAL #5   Title Patient will decrease pain during daily tasks to 4/10.           OT Long Term Goals - 02/23/15 5366    OT LONG TERM GOAL #1   Title Patient will return to highest level of independence with all daily tasks using LUE.   Status On-going   OT LONG TERM GOAL #2   Title Patient will increase AROM to WNL to increase ability to complete daily tasks above shoulder height with less difficulty.    Status On-going   OT LONG TERM GOAL #3   Title Patient will increase LUE strength to 4/5 to increase ability to hold grandchildren.   Status Partially Met   OT LONG TERM GOAL #4   Title Patient will decrease fascial restrictions to trace amount.   Status On-going   OT LONG TERM GOAL #5   Title Patient will decrease pain level to 2/10 or less when completing daily tasks.               Plan - 02/23/15 0919    Clinical Impression Statement A: Pt had milld to moderate complaints of pain in left elbow during exercises. No complaints of pain in left shoulder. min VC  for form and technique.    Plan P: Continue to work on increasing ROM during passive stretching, Continue with functional reaching tasks.         Problem List Patient Active Problem List   Diagnosis Date Noted  . Arthritis of knee, left 06/16/2013  . S/P total knee replacement using cement 06/14/2013  . Osteoarthritis of left knee 03/21/2013  . BPH (benign prostatic hyperplasia)   . GERD (gastroesophageal reflux disease)   . CAD (coronary artery disease)   . Fever 03/20/2013  . Altered mental state 03/20/2013  . Postoperative fever 03/20/2013  . PNA (pneumonia) 03/20/2013  . Preop cardiovascular exam 11/24/2012  . Peroneal tendonitis 10/05/2012  . Arthritis of foot, degenerative 10/05/2012  . HTN (hypertension) 09/25/2012  . Hyperlipidemia 09/25/2012  . Atrial fibrillation ([place]) 06/15/2012  . Long term (current) use of anticoagulants 06/15/2012  . Rotator cuff strain 02/04/2012  . Shoulder pain 02/04/2012  . Personal history of colonic polyps 01/29/2011  . High risk medication use 01/29/2011    Ailene Ravel, OTR/L,CBIS  [phone]  02/23/2015, 9:24 AM  [place] [address] [place], [place], [address] Phone: [phone]   Fax:  [phone]  Name: Jerry Cain MRN: [id number] Date of Birth: Aug 12, 1931

## 2015-02-26 ENCOUNTER — Telehealth (HOSPITAL_COMMUNITY): Payer: Self-pay | Admitting: Specialist

## 2015-02-26 ENCOUNTER — Ambulatory Visit (HOSPITAL_COMMUNITY): Payer: Medicare Other | Admitting: Specialist

## 2015-02-26 NOTE — Telephone Encounter (Signed)
Family has had a virus over the weekend and he doesn't want to share it.

## 2015-02-28 ENCOUNTER — Ambulatory Visit (HOSPITAL_COMMUNITY): Payer: Medicare Other | Admitting: Specialist

## 2015-02-28 DIAGNOSIS — M25612 Stiffness of left shoulder, not elsewhere classified: Secondary | ICD-10-CM

## 2015-02-28 DIAGNOSIS — R29898 Other symptoms and signs involving the musculoskeletal system: Secondary | ICD-10-CM | POA: Diagnosis not present

## 2015-02-28 DIAGNOSIS — M629 Disorder of muscle, unspecified: Secondary | ICD-10-CM | POA: Diagnosis not present

## 2015-02-28 DIAGNOSIS — M25512 Pain in left shoulder: Secondary | ICD-10-CM | POA: Diagnosis not present

## 2015-02-28 NOTE — Therapy (Signed)
[place], [place], [address] Phone: [phone]   Fax:  [phone]  Occupational Therapy Treatment  Patient Details  Name: Jerry Cain MRN: [id number] Date of Birth: Jul 20, 1931 Referring Provider: Dr. Durward Fortes  Encounter Date: 02/28/2015      OT End of Session - 02/28/15 1022    Visit Number 40   Number of Visits 25   Date for OT Re-Evaluation 03/30/15   Authorization Type Medicare   Authorization Time Period before 48th visit - add kx modifier to charges going forward   Authorization - Visit Number 54   Authorization - Number of Visits 44   OT Start Time 0856   OT Stop Time 0939   OT Time Calculation (min) 43 min   Activity Tolerance Patient tolerated treatment well   Behavior During Therapy [facility] for tasks assessed/performed      Past Medical History  Diagnosis Date  . Hypercholesterolemia     takes Atorvastatin daily  . Gout   . BPH (benign prostatic hyperplasia)     takes Proscar daily  . CAD (coronary artery disease) 2009    cardiac cath and PTCA by Dr. Tami Ribas  . Tubular adenoma 2001  . Diverticulosis 2007    tcs by Dr. Laural Golden  . GERD (gastroesophageal reflux disease)   . Adenomatous colon polyp 2001  . Osteoarthritis     left knee  . Complication of anesthesia     hard to wake up  . Hypertension     takes Imdur,Coreg,and Lisinopril daily  . Atrial fibrillation ([place])     takes Coumadin daily  . Pneumonia     many,many yrs ago  . Numbness     right hand pointer and middle finger  . Carpal tunnel syndrome     right  . Joint pain   . Joint swelling   . Nocturia   . Hypothyroidism     takes SYnthroid daily  . Anxiety     Past Surgical History  Procedure Laterality Date  . Hernia repair  2009    left inguinal  . Cataract extraction      bilateral  . Colonoscopy  2007    Dr. Laural Golden- L sided diverticulosis. Next TCS 2012 due to h/o tubular adenoma.  . Colonoscopy   02/26/2011    Procedure: COLONOSCOPY;  Surgeon: Daneil Dolin, MD;  [facility];  Service: Endoscopy;  Laterality: N/A;  11:05  . Tee without cardioversion  12/12/2011    Procedure: TRANSESOPHAGEAL ECHOCARDIOGRAM (TEE);  Surgeon: Sanda Klein, MD;  Location: [facility] ENDOSCOPY;  Service: Cardiovascular;  Laterality: N/A;   successful/sinus rhythm  . Cardioversion  12/12/2011    Procedure: CARDIOVERSION;  Surgeon: Sanda Klein, MD;  Location: [facility];  Service: Cardiovascular;  Laterality: N/A;  . Bunionectomy Right 03/17/2013    Procedure: Lillard Anes, Arthroplasty 2nd toe right foot;  Surgeon: Marcheta Grammes, DPM;  [facility];  Service: Orthopedics;  Laterality: Right;  . Aiken osteotomy Right 03/17/2013    Procedure: Barbie Banner OSTEOTOMY;  Surgeon: Marcheta Grammes, DPM;  Location: [facility];  Service: Orthopedics;  Laterality: Right;  . Flexor tenotomy Left 03/17/2013    Procedure: PERCUTANEOUS FLEXOR TENOTOMY 3RD TOE LEFT FOOT;  Surgeon: Marcheta Grammes, DPM;  Location: [facility];  Service: Orthopedics;  Laterality: Left;  . Cardiac catheterization  06/11/2007    PTCA X2 in 06/2007:  2 overlapping Cypher stents to LAD (2.5x13 and 3.0x23)  . Cardiac catheterization  06/21/2007    Cypher stent 2.5 x 13  to OM branch  . Coronary angioplasty      x 3   . Total knee arthroplasty Left 06/14/2013    Procedure: TOTAL KNEE ARTHROPLASTY;  Surgeon: Garald Balding, MD;  Location: [place];  Service: Orthopedics;  Laterality: Left;  . Rot Left     There were no vitals filed for this visit.  Visit Diagnosis:  Shoulder weakness  Pain in joint, shoulder region, left  Shoulder stiffness, left      Subjective Assessment - 02/28/15 1021    Subjective  S:  I can tell I am getting stronger.  My goal is still to be able to reach overhead.    Currently in Pain? No/denies   Pain Score 0-No pain            [facility] OT Assessment - 02/28/15 0001    Assessment    Diagnosis Left Shoulder RCR, SAD, DCR   Precautions   Precautions Shoulder   Type of Shoulder Precautions PROM for 4 weeks (until 12/07/14) then progress to AAROM (until 01/04/15). Progress to AROM and progress as tolerated.                  OT Treatments/Exercises (OP) - 02/28/15 0001    Exercises   Exercises Shoulder   Shoulder Exercises: Supine   Protraction PROM;5 reps;Strengthening;12 reps   Protraction Weight (lbs) 3   Horizontal ABduction PROM;5 reps;Strengthening;12 reps   Horizontal ABduction Weight (lbs) 3   External Rotation PROM;5 reps;Strengthening;12 reps   External Rotation Weight (lbs) 3   Internal Rotation PROM;5 reps;Strengthening;12 reps   Internal Rotation Weight (lbs) 3   Flexion PROM;5 reps;Strengthening;12 reps   Shoulder Flexion Weight (lbs) 3   ABduction PROM;5 reps;Strengthening;12 reps   Shoulder ABduction Weight (lbs) 3   Shoulder Exercises: Sidelying   External Rotation Strengthening;15 reps   External Rotation Weight (lbs) 2   Internal Rotation Strengthening;10 reps   Internal Rotation Weight (lbs) 2   Flexion Strengthening;10 reps   Flexion Weight (lbs) 2   ABduction Strengthening;10 reps   ABduction Weight (lbs) 2   Shoulder Exercises: ROM/Strengthening   UBE (Upper Arm Bike) 3' forward and 3' reverse at 3.0   Ball on Wall 1 minute with arm flexed to 90 and abducted to 90   Other ROM/Strengthening Exercises patient retrieved 15 items from 2nd overhead shelf in kitchen and then replaced with 2# weight on his wrist patient had mod difficulty with the task    Manual Therapy   Manual Therapy Myofascial release;Muscle Energy Technique   Manual therapy comments Manual therapy completed prior to ROM measurements and treatment exercises.   Myofascial Release Myofascial release to left upper arm, trapezius, and scapularis region to decrease fascial restrictions and increase joint mobility in a pain free zone.    Muscle Energy Technique Muscle  Energy technique completed to left anterior and medial deltoid to decrease muscle spasm and increase ROM.                   OT Short Term Goals - 02/23/15 0921    OT SHORT TERM GOAL #1   Title Patient will be education and independent with HEP.   OT SHORT TERM GOAL #2   Title Patient will increase PROM to [facility] to increase ability to get dressed with less difficulty.   OT SHORT TERM GOAL #3   Title Patient will increase strength to 3/5 to increase ability to  complete daily tasks below shoulder height.    OT SHORT TERM GOAL #4   Title Patient will decrease fascial restrictions from max to min amount.   OT SHORT TERM GOAL #5   Title Patient will decrease pain during daily tasks to 4/10.           OT Long Term Goals - 02/23/15 9024    OT LONG TERM GOAL #1   Title Patient will return to highest level of independence with all daily tasks using LUE.   Status On-going   OT LONG TERM GOAL #2   Title Patient will increase AROM to WNL to increase ability to complete daily tasks above shoulder height with less difficulty.    Status On-going   OT LONG TERM GOAL #3   Title Patient will increase LUE strength to 4/5 to increase ability to hold grandchildren.   Status Partially Met   OT LONG TERM GOAL #4   Title Patient will decrease fascial restrictions to trace amount.   Status On-going   OT LONG TERM GOAL #5   Title Patient will decrease pain level to 2/10 or less when completing daily tasks.               Plan - 02/28/15 1022    Clinical Impression Statement A:  Patient completed strengthening in sidelying for greater range and allowed therapist to give stretch and facilitate increased range at end range.  Patient able to maintain position of ball during scapular ball on wall activity with much improved accuracy.    Plan P:  add theraband flexion, abduction to HEP.  Attempt functional reaching overhead activity that lasts 5 minutes.         Problem List Patient  Active Problem List   Diagnosis Date Noted  . Arthritis of knee, left 06/16/2013  . S/P total knee replacement using cement 06/14/2013  . Osteoarthritis of left knee 03/21/2013  . BPH (benign prostatic hyperplasia)   . GERD (gastroesophageal reflux disease)   . CAD (coronary artery disease)   . Fever 03/20/2013  . Altered mental state 03/20/2013  . Postoperative fever 03/20/2013  . PNA (pneumonia) 03/20/2013  . Preop cardiovascular exam 11/24/2012  . Peroneal tendonitis 10/05/2012  . Arthritis of foot, degenerative 10/05/2012  . HTN (hypertension) 09/25/2012  . Hyperlipidemia 09/25/2012  . Atrial fibrillation ([place]) 06/15/2012  . Long term (current) use of anticoagulants 06/15/2012  . Rotator cuff strain 02/04/2012  . Shoulder pain 02/04/2012  . Personal history of colonic polyps 01/29/2011  . High risk medication use 01/29/2011    Vangie Bicker, OTR/L [phone]  02/28/2015, 10:25 AM  [facility] [address] [place], [place], [address] Phone: [phone]   Fax:  [phone]  Name: MERRIC YOST MRN: [id number] Date of Birth: 20-Jul-1931

## 2015-03-05 ENCOUNTER — Ambulatory Visit (HOSPITAL_COMMUNITY): Payer: Medicare Other | Admitting: Specialist

## 2015-03-05 DIAGNOSIS — M629 Disorder of muscle, unspecified: Secondary | ICD-10-CM | POA: Diagnosis not present

## 2015-03-05 DIAGNOSIS — R29898 Other symptoms and signs involving the musculoskeletal system: Secondary | ICD-10-CM

## 2015-03-05 DIAGNOSIS — M25612 Stiffness of left shoulder, not elsewhere classified: Secondary | ICD-10-CM

## 2015-03-05 DIAGNOSIS — M25512 Pain in left shoulder: Secondary | ICD-10-CM | POA: Diagnosis not present

## 2015-03-05 NOTE — Therapy (Signed)
[place], [place], [address] Phone: [phone]   Fax:  [phone]  Occupational Therapy Treatment  Patient Details  Name: Jerry Cain MRN: [id number] Date of Birth: [birth year] Referring Provider: Dr. Durward Fortes  Encounter Date: 03/05/2015      OT End of Session - 03/05/15 0943    Visit Number 41   Number of Visits 10   Date for OT Re-Evaluation 03/30/15   Authorization Type Medicare   Authorization Time Period before 48th visit - add kx modifier to charges going forward   Authorization - Visit Number 68   Authorization - Number of Visits 4   OT Start Time 0850   OT Stop Time 0940   OT Time Calculation (min) 50 min   Activity Tolerance Patient tolerated treatment well   Behavior During Therapy [facility] for tasks assessed/performed      Past Medical History  Diagnosis Date  . Hypercholesterolemia     takes Atorvastatin daily  . Gout   . BPH (benign prostatic hyperplasia)     takes Proscar daily  . CAD (coronary artery disease) 2009    cardiac cath and PTCA by Dr. Tami Ribas  . Tubular adenoma 2001  . Diverticulosis 2007    tcs by Dr. Laural Golden  . GERD (gastroesophageal reflux disease)   . Adenomatous colon polyp 2001  . Osteoarthritis     left knee  . Complication of anesthesia     hard to wake up  . Hypertension     takes Imdur,Coreg,and Lisinopril daily  . Atrial fibrillation ([place])     takes Coumadin daily  . Pneumonia     many,many yrs ago  . Numbness     right hand pointer and middle finger  . Carpal tunnel syndrome     right  . Joint pain   . Joint swelling   . Nocturia   . Hypothyroidism     takes SYnthroid daily  . Anxiety     Past Surgical History  Procedure Laterality Date  . Hernia repair  2009    left inguinal  . Cataract extraction      bilateral  . Colonoscopy  2007    Dr. Laural Golden- L sided diverticulosis. Next TCS 2012 due to h/o tubular adenoma.  . Colonoscopy   02/26/2011    Procedure: COLONOSCOPY;  Surgeon: Daneil Dolin, MD;  [facility];  Service: Endoscopy;  Laterality: N/A;  11:05  . Tee without cardioversion  12/12/2011    Procedure: TRANSESOPHAGEAL ECHOCARDIOGRAM (TEE);  Surgeon: Sanda Klein, MD;  Location: [facility] END[facility]COPY;  Service: Cardiovascular;  Laterality: N/A;   successful/sinus rhythm  . Cardioversion  12/12/2011    Procedure: CARDIOVERSION;  Surgeon: Sanda Klein, MD;  Location: [place];  Service: Cardiovascular;  Laterality: N/A;  . Bunionectomy Right 03/17/2013    Procedure: Lillard Anes, Arthroplasty 2nd toe right foot;  Surgeon: Marcheta Grammes, DPM;  Location: [facility];  Service: Orthopedics;  Laterality: Right;  . Aiken osteotomy Right 03/17/2013    Procedure: Barbie Banner OSTEOTOMY;  Surgeon: Marcheta Grammes, DPM;  Location: [facility];  Service: Orthopedics;  Laterality: Right;  . Flexor tenotomy Left 03/17/2013    Procedure: PERCUTANEOUS FLEXOR TENOTOMY 3RD TOE LEFT FOOT;  Surgeon: Marcheta Grammes, DPM;  Location: [facility];  Service: Orthopedics;  Laterality: Left;  . Cardiac catheterization  06/11/2007    PTCA X2 in 06/2007:  2 overlapping Cypher stents to LAD (2.5x13 and 3.0x23)  . Cardiac catheterization  06/21/2007    Cypher stent 2.5 x 13  to OM branch  . Coronary angioplasty      x 3   . Total knee arthroplasty Left 06/14/2013    Procedure: TOTAL KNEE ARTHROPLASTY;  Surgeon: Garald Balding, MD;  Location: [place];  Service: Orthopedics;  Laterality: Left;  . Rot Left     There were no vitals filed for this visit.  Visit Diagnosis:  Shoulder weakness  Pain in joint, shoulder region, left  Shoulder stiffness, left      Subjective Assessment - 03/05/15 0855    Subjective  S:  It is a little sore.   Currently in Pain? Yes   Pain Score 1    Pain Location Shoulder   Pain Orientation Left   Pain Descriptors / Indicators Sore            [facility] OT Assessment - 03/05/15 0001     Assessment   Diagnosis Left Shoulder RCR, SAD, DCR   Precautions   Precautions Shoulder   Type of Shoulder Precautions PROM for 4 weeks (until 12/07/14) then progress to AAROM (until 01/04/15). Progress to AROM and progress as tolerated.                  OT Treatments/Exercises (OP) - 03/05/15 0001    Exercises   Exercises Shoulder   Shoulder Exercises: Supine   Protraction PROM;5 reps;Strengthening;12 reps   Protraction Weight (lbs) 3   Horizontal ABduction PROM;5 reps;Strengthening;12 reps   Horizontal ABduction Weight (lbs) 3   External Rotation PROM;5 reps;Strengthening;15 reps   External Rotation Weight (lbs) 3   Internal Rotation PROM;5 reps;Strengthening;15 reps   Internal Rotation Weight (lbs) 3   Flexion PROM;5 reps;Strengthening;15 reps   Shoulder Flexion Weight (lbs) 3   ABduction PROM;5 reps;Strengthening;15 reps   Shoulder ABduction Weight (lbs) 3   Shoulder Exercises: Standing   Flexion Theraband;10 reps   Theraband Level (Shoulder Flexion) Level 2 (Red)   ABduction Theraband;10 reps   Theraband Level (Shoulder ABduction) Level 2 (Red)   Shoulder Exercises: ROM/Strengthening   UBE (Upper Arm Bike) 3' forward and 3' reverse at 3.0   Over Head Lace 2' with 2#   Proximal Shoulder Strengthening, Supine 12X with 3#   Ball on Wall 1 minute with arm flexed to 90 and abducted to 90   Graduated Retraction with Theraband 5 times each with green tband at 120, 90, 45   Sustained Retraction with Theraband 5 times with green tband    Manual Therapy   Manual Therapy Myofascial release   Manual therapy comments Manual therapy completed prior to ROM measurements and treatment exercises.   Myofascial Release Myofascial release to left upper arm, trapezius, and scapularis region to decrease fascial restrictions and increase joint mobility in a pain free zone.    Muscle Energy Technique h                OT Education - 03/05/15 0943    Education provided Yes    Education Details red tband flexion and abduction   Person(s) Educated Patient   Methods Explanation;Demonstration;Handout   Comprehension Verbalized understanding;Returned demonstration          OT Short Term Goals - 02/23/15 0921    OT SHORT TERM GOAL #1   Title Patient will be education and independent with HEP.   OT SHORT TERM GOAL #2   Title Patient will increase PROM to [facility] to increase ability to get dressed with less difficulty.  OT SHORT TERM GOAL #3   Title Patient will increase strength to 3/5 to increase ability to complete daily tasks below shoulder height.    OT SHORT TERM GOAL #4   Title Patient will decrease fascial restrictions from max to min amount.   OT SHORT TERM GOAL #5   Title Patient will decrease pain during daily tasks to 4/10.           OT Long Term Goals - 02/23/15 4584    OT LONG TERM GOAL #1   Title Patient will return to highest level of independence with all daily tasks using LUE.   Status On-going   OT LONG TERM GOAL #2   Title Patient will increase AROM to WNL to increase ability to complete daily tasks above shoulder height with less difficulty.    Status On-going   OT LONG TERM GOAL #3   Title Patient will increase LUE strength to 4/5 to increase ability to hold grandchildren.   Status Partially Met   OT LONG TERM GOAL #4   Title Patient will decrease fascial restrictions to trace amount.   Status On-going   OT LONG TERM GOAL #5   Title Patient will decrease pain level to 2/10 or less when completing daily tasks.               Plan - 03/05/15 0947    Clinical Impression Statement A:  added flexion and abduction with theraband this date.  Patient has mod difficulty with both movements and required vg for correct form.   Plan P:  Increase repetitions with sustained theraband exercises add body blade exercises for improved sustained activity tolerance overhead.         Problem List Patient Active Problem List   Diagnosis  Date Noted  . Arthritis of knee, left 06/16/2013  . S/P total knee replacement using cement 06/14/2013  . Osteoarthritis of left knee 03/21/2013  . BPH (benign prostatic hyperplasia)   . GERD (gastroesophageal reflux disease)   . CAD (coronary artery disease)   . Fever 03/20/2013  . Altered mental state 03/20/2013  . Postoperative fever 03/20/2013  . PNA (pneumonia) 03/20/2013  . Preop cardiovascular exam 11/24/2012  . Peroneal tendonitis 10/05/2012  . Arthritis of foot, degenerative 10/05/2012  . HTN (hypertension) 09/25/2012  . Hyperlipidemia 09/25/2012  . Atrial fibrillation ([place]) 06/15/2012  . Long term (current) use of anticoagulants 06/15/2012  . Rotator cuff strain 02/04/2012  . Shoulder pain 02/04/2012  . Personal history of colonic polyps 01/29/2011  . High risk medication use 01/29/2011    Vangie Bicker, OTR/L [phone]  03/05/2015, 9:51 AM  [facility] [address] [place], [place], [address] Phone: [phone]   Fax:  [phone]  Name: Jerry Cain MRN: [id number] Date of Birth: 09/15/1931

## 2015-03-05 NOTE — Patient Instructions (Signed)
Resisted Shoulder Flexion: Bilateral    Face anchor, tubing end in each hand. With arms straight out in front, pinch shoulder blades together and raise arms over head. Repeat ____ times per set. Do ____ sets per session. Do ____ sessions per day.  http://orth.exer.us/964   Copyright  VHI. All rights reserved.  Strengthening: Resisted Abduction    Hold tubing with right arm across body. Pull up and away from side. Move through pain-free range of motion. Repeat ____ times per set. Do ____ sets per session. Do ____ sessions per day.  http://orth.exer.us/826   Copyright  VHI. All rights reserved.

## 2015-03-07 ENCOUNTER — Encounter (HOSPITAL_COMMUNITY): Payer: Medicare Other | Admitting: Specialist

## 2015-03-07 DIAGNOSIS — Z9842 Cataract extraction status, left eye: Secondary | ICD-10-CM | POA: Diagnosis not present

## 2015-03-07 DIAGNOSIS — H26491 Other secondary cataract, right eye: Secondary | ICD-10-CM | POA: Diagnosis not present

## 2015-03-07 DIAGNOSIS — H40003 Preglaucoma, unspecified, bilateral: Secondary | ICD-10-CM | POA: Diagnosis not present

## 2015-03-07 DIAGNOSIS — Z961 Presence of intraocular lens: Secondary | ICD-10-CM | POA: Diagnosis not present

## 2015-03-07 DIAGNOSIS — Z23 Encounter for immunization: Secondary | ICD-10-CM | POA: Diagnosis not present

## 2015-03-08 ENCOUNTER — Ambulatory Visit (HOSPITAL_COMMUNITY): Payer: Medicare Other | Attending: Orthopaedic Surgery

## 2015-03-08 ENCOUNTER — Encounter (HOSPITAL_COMMUNITY): Payer: Self-pay

## 2015-03-08 DIAGNOSIS — M25631 Stiffness of right wrist, not elsewhere classified: Secondary | ICD-10-CM | POA: Insufficient documentation

## 2015-03-08 DIAGNOSIS — M25512 Pain in left shoulder: Secondary | ICD-10-CM | POA: Insufficient documentation

## 2015-03-08 DIAGNOSIS — M25641 Stiffness of right hand, not elsewhere classified: Secondary | ICD-10-CM | POA: Insufficient documentation

## 2015-03-08 DIAGNOSIS — Z9889 Other specified postprocedural states: Secondary | ICD-10-CM | POA: Insufficient documentation

## 2015-03-08 DIAGNOSIS — R29898 Other symptoms and signs involving the musculoskeletal system: Secondary | ICD-10-CM | POA: Insufficient documentation

## 2015-03-08 DIAGNOSIS — R279 Unspecified lack of coordination: Secondary | ICD-10-CM | POA: Diagnosis not present

## 2015-03-08 DIAGNOSIS — M25612 Stiffness of left shoulder, not elsewhere classified: Secondary | ICD-10-CM | POA: Insufficient documentation

## 2015-03-08 DIAGNOSIS — M25531 Pain in right wrist: Secondary | ICD-10-CM | POA: Insufficient documentation

## 2015-03-08 DIAGNOSIS — M6281 Muscle weakness (generalized): Secondary | ICD-10-CM | POA: Insufficient documentation

## 2015-03-08 DIAGNOSIS — M6289 Other specified disorders of muscle: Secondary | ICD-10-CM

## 2015-03-08 DIAGNOSIS — M79641 Pain in right hand: Secondary | ICD-10-CM | POA: Diagnosis not present

## 2015-03-08 DIAGNOSIS — M629 Disorder of muscle, unspecified: Secondary | ICD-10-CM | POA: Insufficient documentation

## 2015-03-08 NOTE — Therapy (Signed)
[place], [place], [address] Phone: [phone]   Fax:  [phone]  Occupational Therapy Treatment  Patient Details  Name: Jerry Cain MRN: [id number] Date of Birth: [birth year] Referring Provider: Dr. Durward Fortes  Encounter Date: 03/08/2015      OT End of Session - 03/08/15 1126    Visit Number 42   Number of Visits 52   Date for OT Re-Evaluation 03/30/15   Authorization Type Medicare   Authorization Time Period before 48th visit - add kx modifier to charges going forward   Authorization - Visit Number 81   Authorization - Number of Visits 21   OT Start Time 0850   OT Stop Time 0930   OT Time Calculation (min) 40 min   Activity Tolerance Patient tolerated treatment well   Behavior During Therapy [facility] for tasks assessed/performed      Past Medical History  Diagnosis Date  . Hypercholesterolemia     takes Atorvastatin daily  . Gout   . BPH (benign prostatic hyperplasia)     takes Proscar daily  . CAD (coronary artery disease) 2009    cardiac cath and PTCA by Dr. Tami Ribas  . Tubular adenoma 2001  . Diverticulosis 2007    tcs by Dr. Laural Golden  . GERD (gastroesophageal reflux disease)   . Adenomatous colon polyp 2001  . Osteoarthritis     left knee  . Complication of anesthesia     hard to wake up  . Hypertension     takes Imdur,Coreg,and Lisinopril daily  . Atrial fibrillation ([place])     takes Coumadin daily  . Pneumonia     many,many yrs ago  . Numbness     right hand pointer and middle finger  . Carpal tunnel syndrome     right  . Joint pain   . Joint swelling   . Nocturia   . Hypothyroidism     takes SYnthroid daily  . Anxiety     Past Surgical History  Procedure Laterality Date  . Hernia repair  2009    left inguinal  . Cataract extraction      bilateral  . Colonoscopy  2007    Dr. Laural Golden- L sided diverticulosis. Next TCS 2012 due to h/o tubular adenoma.  . Colonoscopy   02/26/2011    Procedure: COLONOSCOPY;  Surgeon: Daneil Dolin, MD;  [facility];  Service: Endoscopy;  Laterality: N/A;  11:05  . Tee without cardioversion  12/12/2011    Procedure: TRANSESOPHAGEAL ECHOCARDIOGRAM (TEE);  Surgeon: Sanda Klein, MD;  Location: [facility] ENDOSCOPY;  Service: Cardiovascular;  Laterality: N/A;   successful/sinus rhythm  . Cardioversion  12/12/2011    Procedure: CARDIOVERSION;  Surgeon: Sanda Klein, MD;  Location: [place];  Service: Cardiovascular;  Laterality: N/A;  . Bunionectomy Right 03/17/2013    Procedure: Lillard Anes, Arthroplasty 2nd toe right foot;  Surgeon: Marcheta Grammes, DPM;  [facility];  Service: Orthopedics;  Laterality: Right;  . Aiken osteotomy Right 03/17/2013    Procedure: Barbie Banner OSTEOTOMY;  Surgeon: Marcheta Grammes, DPM;  Location[facility];  Service: Orthopedics;  Laterality: Right;  . Flexor tenotomy Left 03/17/2013    Procedure: PERCUTANEOUS FLEXOR TENOTOMY 3RD TOE LEFT FOOT;  Surgeon: Marcheta Grammes, DPM;  [facility];  Service: Orthopedics;  Laterality: Left;  . Cardiac catheterization  06/11/2007    PTCA X2 in 06/2007:  2 overlapping Cypher stents to LAD (2.5x13 and 3.0x23)  . Cardiac catheterization  06/21/2007    Cypher stent 2.5 x 13  to OM branch  . Coronary angioplasty      x 3   . Total knee arthroplasty Left 06/14/2013    Procedure: TOTAL KNEE ARTHROPLASTY;  Surgeon: Garald Balding, MD;  Location: [place];  Service: Orthopedics;  Laterality: Left;  . Rot Left     There were no vitals filed for this visit.  Visit Diagnosis:  Shoulder weakness  Shoulder stiffness, left  Tight fascia      Subjective Assessment - 03/08/15 0922    Subjective  S: That ball on the wall isn't has hard has it used to be.    Currently in Pain? No/denies                      OT Treatments/Exercises (OP) - 03/08/15 0001    Exercises   Exercises Shoulder   Shoulder Exercises: Supine    Protraction PROM;5 reps;Strengthening;15 reps   Protraction Weight (lbs) 3   Horizontal ABduction PROM;5 reps;Strengthening;15 reps   Horizontal ABduction Weight (lbs) 3   External Rotation PROM;5 reps;Strengthening;15 reps   External Rotation Weight (lbs) 3   Internal Rotation PROM;5 reps;Strengthening;15 reps   Internal Rotation Weight (lbs) 3   Flexion PROM;5 reps;Strengthening;15 reps   Shoulder Flexion Weight (lbs) 3   ABduction PROM;5 reps;Strengthening;15 reps   Shoulder ABduction Weight (lbs) 3   Shoulder Exercises: ROM/Strengthening   UBE (Upper Arm Bike) 3' forward and 3' reverse at 3.0   Ball on Wall 1 minute with arm flexed to 90 and abducted to 90   Graduated Retraction with Theraband 10 times each with red tband at 120, 90, 45   Manual Therapy   Manual Therapy Myofascial release   Manual therapy comments Manual therapy completed prior to ROM measurements and treatment exercises.   Myofascial Release Myofascial release to left upper arm, trapezius, and scapularis region to decrease fascial restrictions and increase joint mobility in a pain free zone.    Muscle Energy Technique Muscle energy technique to left upper arm to decrease muscle spam and increase joint ROM.                   OT Short Term Goals - 02/23/15 0921    OT SHORT TERM GOAL #1   Title Patient will be education and independent with HEP.   OT SHORT TERM GOAL #2   Title Patient will increase PROM to [facility] to increase ability to get dressed with less difficulty.   OT SHORT TERM GOAL #3   Title Patient will increase strength to 3/5 to increase ability to complete daily tasks below shoulder height.    OT SHORT TERM GOAL #4   Title Patient will decrease fascial restrictions from max to min amount.   OT SHORT TERM GOAL #5   Title Patient will decrease pain during daily tasks to 4/10.           OT Long Term Goals - 02/23/15 5027    OT LONG TERM GOAL #1   Title Patient will return to highest level  of independence with all daily tasks using LUE.   Status On-going   OT LONG TERM GOAL #2   Title Patient will increase AROM to WNL to increase ability to complete daily tasks above shoulder height with less difficulty.    Status On-going   OT LONG TERM GOAL #3   Title Patient will increase LUE strength to 4/5 to increase ability  to hold grandchildren.   Status Partially Met   OT LONG TERM GOAL #4   Title Patient will decrease fascial restrictions to trace amount.   Status On-going   OT LONG TERM GOAL #5   Title Patient will decrease pain level to 2/10 or less when completing daily tasks.               Plan - 03/08/15 1126    Clinical Impression Statement A: Increased repetitions with sustained theraband exercises. patient had increased difficulty with shoulder retraction at 120 degrees.Verbal and physical cues for form and technique. Did not complete body blade due to time constraint.    Plan P: Add body blade for exercises for improved sustained activity tolerance overhead.         Problem List Patient Active Problem List   Diagnosis Date Noted  . Arthritis of knee, left 06/16/2013  . S/P total knee replacement using cement 06/14/2013  . Osteoarthritis of left knee 03/21/2013  . BPH (benign prostatic hyperplasia)   . GERD (gastroesophageal reflux disease)   . CAD (coronary artery disease)   . Fever 03/20/2013  . Altered mental state 03/20/2013  . Postoperative fever 03/20/2013  . PNA (pneumonia) 03/20/2013  . Preop cardiovascular exam 11/24/2012  . Peroneal tendonitis 10/05/2012  . Arthritis of foot, degenerative 10/05/2012  . HTN (hypertension) 09/25/2012  . Hyperlipidemia 09/25/2012  . Atrial fibrillation ([place]) 06/15/2012  . Long term (current) use of anticoagulants 06/15/2012  . Rotator cuff strain 02/04/2012  . Shoulder pain 02/04/2012  . Personal history of colonic polyps 01/29/2011  . High risk medication use 01/29/2011    Ailene Ravel, OTR/L,CBIS   [phone]  03/08/2015, 11:29 AM  [facility] [address] [place], [place], [address] Phone: [phone]   Fax:  [phone]  Name: Jerry Cain MRN: [id number] Date of Birth: [birth year]

## 2015-03-09 ENCOUNTER — Encounter (HOSPITAL_COMMUNITY): Payer: Self-pay

## 2015-03-09 ENCOUNTER — Ambulatory Visit (HOSPITAL_COMMUNITY): Payer: Medicare Other

## 2015-03-09 DIAGNOSIS — M6289 Other specified disorders of muscle: Secondary | ICD-10-CM

## 2015-03-09 DIAGNOSIS — M629 Disorder of muscle, unspecified: Secondary | ICD-10-CM | POA: Diagnosis not present

## 2015-03-09 DIAGNOSIS — M25612 Stiffness of left shoulder, not elsewhere classified: Secondary | ICD-10-CM

## 2015-03-09 DIAGNOSIS — M25641 Stiffness of right hand, not elsewhere classified: Secondary | ICD-10-CM | POA: Diagnosis not present

## 2015-03-09 DIAGNOSIS — R29898 Other symptoms and signs involving the musculoskeletal system: Secondary | ICD-10-CM

## 2015-03-09 DIAGNOSIS — M6281 Muscle weakness (generalized): Secondary | ICD-10-CM | POA: Diagnosis not present

## 2015-03-09 DIAGNOSIS — M25631 Stiffness of right wrist, not elsewhere classified: Secondary | ICD-10-CM | POA: Diagnosis not present

## 2015-03-09 NOTE — Therapy (Signed)
[place], [place], [address] Phone: [phone]   Fax:  [phone]  Occupational Therapy Treatment  Patient Details  Name: Jerry Cain MRN: [id number] Date of Birth: 10-19-1931 Referring Provider: Dr. Durward Fortes  Encounter Date: 03/09/2015      OT End of Session - 03/09/15 0930    Visit Number 43   Number of Visits 3   Date for OT Re-Evaluation 03/30/15   Authorization Type Medicare   Authorization Time Period before 48th visit - add kx modifier to charges going forward   Authorization - Visit Number 48   Authorization - Number of Visits 50   OT Start Time 0850   OT Stop Time 0930   OT Time Calculation (min) 40 min   Activity Tolerance Patient tolerated treatment well   Behavior During Therapy [facility] for tasks assessed/performed      Past Medical History  Diagnosis Date  . Hypercholesterolemia     takes Atorvastatin daily  . Gout   . BPH (benign prostatic hyperplasia)     takes Proscar daily  . CAD (coronary artery disease) 2009    cardiac cath and PTCA by Dr. Tami Ribas  . Tubular adenoma 2001  . Diverticulosis 2007    tcs by Dr. Laural Golden  . GERD (gastroesophageal reflux disease)   . Adenomatous colon polyp 2001  . Osteoarthritis     left knee  . Complication of anesthesia     hard to wake up  . Hypertension     takes Imdur,Coreg,and Lisinopril daily  . Atrial fibrillation ([place])     takes Coumadin daily  . Pneumonia     many,many yrs ago  . Numbness     right hand pointer and middle finger  . Carpal tunnel syndrome     right  . Joint pain   . Joint swelling   . Nocturia   . Hypothyroidism     takes SYnthroid daily  . Anxiety     Past Surgical History  Procedure Laterality Date  . Hernia repair  2009    left inguinal  . Cataract extraction      bilateral  . Colonoscopy  2007    Dr. Laural Golden- L sided diverticulosis. Next TCS 2012 due to h/o tubular adenoma.  . Colonoscopy   02/26/2011    Procedure: COLONOSCOPY;  Surgeon: Daneil Dolin, MD;  [facility];  Service: Endoscopy;  Laterality: N/A;  11:05  . Tee without cardioversion  12/12/2011    Procedure: TRANSESOPHAGEAL ECHOCARDIOGRAM (TEE);  Surgeon: Sanda Klein, MD;  Location: [facility] ENDOSCOPY;  Service: Cardiovascular;  Laterality: N/A;   successful/sinus rhythm  . Cardioversion  12/12/2011    Procedure: CARDIOVERSION;  Surgeon: Sanda Klein, MD;  Location: [place];  Service: Cardiovascular;  Laterality: N/A;  . Bunionectomy Right 03/17/2013    Procedure: Lillard Anes, Arthroplasty 2nd toe right foot;  Surgeon: Marcheta Grammes, DPM;  Location: [facility];  Service: Orthopedics;  Laterality: Right;  . Aiken osteotomy Right 03/17/2013    Procedure: Barbie Banner OSTEOTOMY;  Surgeon: Marcheta Grammes, DPM;  Location: [facility];  Service: Orthopedics;  Laterality: Right;  . Flexor tenotomy Left 03/17/2013    Procedure: PERCUTANEOUS FLEXOR TENOTOMY 3RD TOE LEFT FOOT;  Surgeon: Marcheta Grammes, DPM;  Location: [facility];  Service: Orthopedics;  Laterality: Left;  . Cardiac catheterization  06/11/2007    PTCA X2 in 06/2007:  2 overlapping Cypher stents to LAD (2.5x13 and 3.0x23)  . Cardiac catheterization  06/21/2007    Cypher stent 2.5 x 13  to OM branch  . Coronary angioplasty      x 3   . Total knee arthroplasty Left 06/14/2013    Procedure: TOTAL KNEE ARTHROPLASTY;  Surgeon: Garald Balding, MD;  Location: [place];  Service: Orthopedics;  Laterality: Left;  . Rot Left     There were no vitals filed for this visit.  Visit Diagnosis:  Shoulder weakness  Shoulder stiffness, left  Tight fascia      Subjective Assessment - 03/09/15 0909    Subjective  S: I do better going out the side. I think it's because I do that more than straight up.   Currently in Pain? No/denies                      OT Treatments/Exercises (OP) - 03/09/15 0915    Exercises   Exercises Shoulder    Shoulder Exercises: Supine   Protraction PROM;5 reps;Strengthening;15 reps   Protraction Weight (lbs) 3   Horizontal ABduction PROM;5 reps;Strengthening;15 reps   Horizontal ABduction Weight (lbs) 3   External Rotation PROM;5 reps;Strengthening;15 reps   External Rotation Weight (lbs) 3   Internal Rotation PROM;5 reps;Strengthening;15 reps   Internal Rotation Weight (lbs) 3   Flexion PROM;5 reps;Strengthening;15 reps   Shoulder Flexion Weight (lbs) 3   ABduction PROM;5 reps;Strengthening;15 reps   Shoulder ABduction Weight (lbs) 3   Shoulder Exercises: ROM/Strengthening   UBE (Upper Arm Bike) 3' forward and 3' reverse at 3.0   Cybex Press 2 plate;20 reps   Cybex Row 2.5 plate;20 reps   Proximal Shoulder Strengthening, Supine 15X with 3#   Shoulder Exercises: Body Blade   Flexion 30 seconds;1 rep   ABduction 30 seconds;1 rep   Manual Therapy   Manual Therapy Myofascial release;Muscle Energy Technique   Manual therapy comments Manual therapy completed prior to ROM measurements and treatment exercises.   Myofascial Release Myofascial release to left upper arm, trapezius, and scapularis region to decrease fascial restrictions and increase joint mobility in a pain free zone.    Muscle Energy Technique Muscle energy technique to left upper arm to decrease muscle spam and increase joint ROM.                   OT Short Term Goals - 02/23/15 0921    OT SHORT TERM GOAL #1   Title Patient will be education and independent with HEP.   OT SHORT TERM GOAL #2   Title Patient will increase PROM to [facility] to increase ability to get dressed with less difficulty.   OT SHORT TERM GOAL #3   Title Patient will increase strength to 3/5 to increase ability to complete daily tasks below shoulder height.    OT SHORT TERM GOAL #4   Title Patient will decrease fascial restrictions from max to min amount.   OT SHORT TERM GOAL #5   Title Patient will decrease pain during daily tasks to 4/10.            OT Long Term Goals - 02/23/15 0737    OT LONG TERM GOAL #1   Title Patient will return to highest level of independence with all daily tasks using LUE.   Status On-going   OT LONG TERM GOAL #2   Title Patient will increase AROM to WNL to increase ability to complete daily tasks above shoulder height with less difficulty.    Status On-going   OT LONG TERM  GOAL #3   Title Patient will increase LUE strength to 4/5 to increase ability to hold grandchildren.   Status Partially Met   OT LONG TERM GOAL #4   Title Patient will decrease fascial restrictions to trace amount.   Status On-going   OT LONG TERM GOAL #5   Title Patient will decrease pain level to 2/10 or less when completing daily tasks.               Plan - 03/09/15 0931    Clinical Impression Statement A: Added the body blade this session although patient was unable to achieve required force to complete required technique.    Plan P: Continue to work on increasing scapular stability needed to compelte overhead activities.        Problem List Patient Active Problem List   Diagnosis Date Noted  . Arthritis of knee, left 06/16/2013  . S/P total knee replacement using cement 06/14/2013  . Osteoarthritis of left knee 03/21/2013  . BPH (benign prostatic hyperplasia)   . GERD (gastroesophageal reflux disease)   . CAD (coronary artery disease)   . Fever 03/20/2013  . Altered mental state 03/20/2013  . Postoperative fever 03/20/2013  . PNA (pneumonia) 03/20/2013  . Preop cardiovascular exam 11/24/2012  . Peroneal tendonitis 10/05/2012  . Arthritis of foot, degenerative 10/05/2012  . HTN (hypertension) 09/25/2012  . Hyperlipidemia 09/25/2012  . Atrial fibrillation ([place]) 06/15/2012  . Long term (current) use of anticoagulants 06/15/2012  . Rotator cuff strain 02/04/2012  . Shoulder pain 02/04/2012  . Personal history of colonic polyps 01/29/2011  . High risk medication use 01/29/2011    Ailene Ravel, OTR/L,CBIS  [phone]  03/09/2015, 10:01 AM  [place] [address] [place], [place], [address] Phone: [phone]   Fax:  [phone]  Name: Jerry Cain MRN: [id number] Date of Birth: 03-20-1932

## 2015-03-12 ENCOUNTER — Ambulatory Visit (HOSPITAL_COMMUNITY): Payer: Medicare Other | Admitting: Specialist

## 2015-03-12 DIAGNOSIS — M25641 Stiffness of right hand, not elsewhere classified: Secondary | ICD-10-CM | POA: Diagnosis not present

## 2015-03-12 DIAGNOSIS — M25631 Stiffness of right wrist, not elsewhere classified: Secondary | ICD-10-CM | POA: Diagnosis not present

## 2015-03-12 DIAGNOSIS — R29898 Other symptoms and signs involving the musculoskeletal system: Secondary | ICD-10-CM

## 2015-03-12 DIAGNOSIS — M25612 Stiffness of left shoulder, not elsewhere classified: Secondary | ICD-10-CM | POA: Diagnosis not present

## 2015-03-12 DIAGNOSIS — M25512 Pain in left shoulder: Secondary | ICD-10-CM

## 2015-03-12 DIAGNOSIS — M6281 Muscle weakness (generalized): Secondary | ICD-10-CM | POA: Diagnosis not present

## 2015-03-12 DIAGNOSIS — M629 Disorder of muscle, unspecified: Secondary | ICD-10-CM | POA: Diagnosis not present

## 2015-03-12 NOTE — Therapy (Signed)
[place], [place], [address] Phone: [phone]   Fax:  [phone]  Occupational Therapy Treatment  Patient Details  Name: Jerry Cain MRN: [id number] Date of Birth: 08/04/1931 Referring Provider: Dr. Durward Fortes  Encounter Date: 03/12/2015      OT End of Session - 03/12/15 1100    Visit Number 74   Number of Visits 51   Date for OT Re-Evaluation 03/30/15   Authorization Type Medicare   Authorization Time Period before 48th visit - add kx modifier to charges going forward   Authorization - Visit Number 5   Authorization - Number of Visits 48   OT Start Time 1023   OT Stop Time 1102   OT Time Calculation (min) 39 min   Activity Tolerance Patient tolerated treatment well   Behavior During Therapy [facility] for tasks assessed/performed      Past Medical History  Diagnosis Date  . Hypercholesterolemia     takes Atorvastatin daily  . Gout   . BPH (benign prostatic hyperplasia)     takes Proscar daily  . CAD (coronary artery disease) 2009    cardiac cath and PTCA by Dr. Tami Ribas  . Tubular adenoma 2001  . Diverticulosis 2007    tcs by Dr. Laural Golden  . GERD (gastroesophageal reflux disease)   . Adenomatous colon polyp 2001  . Osteoarthritis     left knee  . Complication of anesthesia     hard to wake up  . Hypertension     takes Imdur,Coreg,and Lisinopril daily  . Atrial fibrillation ([place])     takes Coumadin daily  . Pneumonia     many,many yrs ago  . Numbness     right hand pointer and middle finger  . Carpal tunnel syndrome     right  . Joint pain   . Joint swelling   . Nocturia   . Hypothyroidism     takes SYnthroid daily  . Anxiety     Past Surgical History  Procedure Laterality Date  . Hernia repair  2009    left inguinal  . Cataract extraction      bilateral  . Colonoscopy  2007    Dr. Laural Golden- L sided diverticulosis. Next TCS 2012 due to h/o tubular adenoma.  . Colonoscopy   02/26/2011    Procedure: COLONOSCOPY;  Surgeon: Daneil Dolin, MD;  [facility];  Service: Endoscopy;  Laterality: N/A;  11:05  . Tee without cardioversion  12/12/2011    Procedure: TRANSESOPHAGEAL ECHOCARDIOGRAM (TEE);  Surgeon: Sanda Klein, MD;  Location: [facility] ENDOSCOPY;  Service: Cardiovascular;  Laterality: N/A;   successful/sinus rhythm  . Cardioversion  12/12/2011    Procedure: CARDIOVERSION;  Surgeon: Sanda Klein, MD;  Location: [place];  Service: Cardiovascular;  Laterality: N/A;  . Bunionectomy Right 03/17/2013    Procedure: Lillard Anes, Arthroplasty 2nd toe right foot;  Surgeon: Marcheta Grammes, DPM;  [facility];  Service: Orthopedics;  Laterality: Right;  . Aiken osteotomy Right 03/17/2013    Procedure: Barbie Banner OSTEOTOMY;  Surgeon: Marcheta Grammes, DPM;  [facility];  Service: Orthopedics;  Laterality: Right;  . Flexor tenotomy Left 03/17/2013    Procedure: PERCUTANEOUS FLEXOR TENOTOMY 3RD TOE LEFT FOOT;  Surgeon: Marcheta Grammes, DPM;  [facility];  Service: Orthopedics;  Laterality: Left;  . Cardiac catheterization  06/11/2007    PTCA X2 in 06/2007:  2 overlapping Cypher stents to LAD (2.5x13 and 3.0x23)  . Cardiac catheterization  06/21/2007    Cypher stent 2.5 x 13  to OM branch  . Coronary angioplasty      x 3   . Total knee arthroplasty Left 06/14/2013    Procedure: TOTAL KNEE ARTHROPLASTY;  Surgeon: Garald Balding, MD;  Location: [place];  Service: Orthopedics;  Laterality: Left;  . Rot Left     There were no vitals filed for this visit.  Visit Diagnosis:  Shoulder weakness  Shoulder stiffness, left  Pain in joint, shoulder region, left      Subjective Assessment - 03/12/15 1059    Subjective  S:  I feel a bit stiffer today than I have been.    Pain Score 1    Pain Location Shoulder   Pain Orientation Left   Pain Descriptors / Indicators Aching   Pain Type Acute pain   Pain Onset Today             [facility] OT Assessment - 03/12/15 0001    Assessment   Diagnosis Left Shoulder RCR, SAD, DCR   Precautions   Precautions Shoulder   Type of Shoulder Precautions PROM for 4 weeks (until 12/07/14) then progress to AAROM (until 01/04/15). Progress to AROM and progress as tolerated.                  OT Treatments/Exercises (OP) - 03/12/15 0001    Exercises   Exercises Shoulder   Shoulder Exercises: Supine   Protraction PROM;5 reps;Strengthening;15 reps   Protraction Weight (lbs) 3   Horizontal ABduction PROM;5 reps;Strengthening;15 reps   Horizontal ABduction Weight (lbs) 3   External Rotation PROM;5 reps;Strengthening;15 reps   External Rotation Weight (lbs) 3   Internal Rotation PROM;5 reps;Strengthening;15 reps   Internal Rotation Weight (lbs) 3   Flexion PROM;5 reps;Strengthening;15 reps   Shoulder Flexion Weight (lbs) 3   ABduction PROM;5 reps;Strengthening;15 reps   Shoulder ABduction Weight (lbs) 3   Shoulder Exercises: Sidelying   External Rotation Strengthening;15 reps   External Rotation Weight (lbs) 3   Internal Rotation Strengthening;10 reps   Internal Rotation Weight (lbs) 3   Flexion Strengthening;10 reps   Flexion Weight (lbs) 3   ABduction Strengthening;10 reps   ABduction Weight (lbs) 3   Shoulder Exercises: ROM/Strengthening   UBE (Upper Arm Bike) 3' forward and 3' reverse at 3.0   "W" Arms 10 times in modified position with min facilitation from OT for proper form   X to V Arms 15 times with 3# weight with min facilitation from OT for end range achievement   Ball on Wall 1 minute with arm flexed to 90 and abducted to 90   Graduated Retraction with Theraband 10 times each with red tband at 120, 90, 45   Sustained Retraction with Theraband 5 times with green tband    Manual Therapy   Manual Therapy Myofascial release   Manual therapy comments Manual therapy completed prior to ROM measurements and treatment exercises.   Myofascial Release Myofascial  release to left upper arm, trapezius, and scapularis region to decrease fascial restrictions and increase joint mobility in a pain free zone.                   OT Short Term Goals - 02/23/15 0921    OT SHORT TERM GOAL #1   Title Patient will be education and independent with HEP.   OT SHORT TERM GOAL #2   Title Patient will increase PROM to [facility] to increase ability to get dressed with  less difficulty.   OT SHORT TERM GOAL #3   Title Patient will increase strength to 3/5 to increase ability to complete daily tasks below shoulder height.    OT SHORT TERM GOAL #4   Title Patient will decrease fascial restrictions from max to min amount.   OT SHORT TERM GOAL #5   Title Patient will decrease pain during daily tasks to 4/10.           OT Long Term Goals - 02/23/15 7353    OT LONG TERM GOAL #1   Title Patient will return to highest level of independence with all daily tasks using LUE.   Status On-going   OT LONG TERM GOAL #2   Title Patient will increase AROM to WNL to increase ability to complete daily tasks above shoulder height with less difficulty.    Status On-going   OT LONG TERM GOAL #3   Title Patient will increase LUE strength to 4/5 to increase ability to hold grandchildren.   Status Partially Met   OT LONG TERM GOAL #4   Title Patient will decrease fascial restrictions to trace amount.   Status On-going   OT LONG TERM GOAL #5   Title Patient will decrease pain level to 2/10 or less when completing daily tasks.               Plan - 03/12/15 1100    Clinical Impression Statement A:  Required facilitation with all scapular stability exercises for proper technique and to achieve end range of each movement.     Plan P:  Review and update HEP, issue [employer] flyer, DC.        Problem List Patient Active Problem List   Diagnosis Date Noted  . Arthritis of knee, left 06/16/2013  . S/P total knee replacement using cement 06/14/2013  . Osteoarthritis of left  knee 03/21/2013  . BPH (benign prostatic hyperplasia)   . GERD (gastroesophageal reflux disease)   . CAD (coronary artery disease)   . Fever 03/20/2013  . Altered mental state 03/20/2013  . Postoperative fever 03/20/2013  . PNA (pneumonia) 03/20/2013  . Preop cardiovascular exam 11/24/2012  . Peroneal tendonitis 10/05/2012  . Arthritis of foot, degenerative 10/05/2012  . HTN (hypertension) 09/25/2012  . Hyperlipidemia 09/25/2012  . Atrial fibrillation ([place]) 06/15/2012  . Long term (current) use of anticoagulants 06/15/2012  . Rotator cuff strain 02/04/2012  . Shoulder pain 02/04/2012  . Personal history of colonic polyps 01/29/2011  . High risk medication use 01/29/2011    Vangie Bicker, OTR/L [phone]  03/12/2015, 11:02 AM  [place], [place], [address] Phone: [phone]   Fax:  [phone]  Name: Jerry Cain MRN: [id number] Date of Birth: 09-03-1931

## 2015-03-14 ENCOUNTER — Ambulatory Visit (HOSPITAL_COMMUNITY): Payer: Medicare Other

## 2015-03-14 ENCOUNTER — Encounter (HOSPITAL_COMMUNITY): Payer: Self-pay

## 2015-03-14 DIAGNOSIS — R29898 Other symptoms and signs involving the musculoskeletal system: Secondary | ICD-10-CM

## 2015-03-14 DIAGNOSIS — M25612 Stiffness of left shoulder, not elsewhere classified: Secondary | ICD-10-CM

## 2015-03-14 DIAGNOSIS — M629 Disorder of muscle, unspecified: Secondary | ICD-10-CM | POA: Diagnosis not present

## 2015-03-14 DIAGNOSIS — M25631 Stiffness of right wrist, not elsewhere classified: Secondary | ICD-10-CM | POA: Diagnosis not present

## 2015-03-14 DIAGNOSIS — M6289 Other specified disorders of muscle: Secondary | ICD-10-CM

## 2015-03-14 DIAGNOSIS — M6281 Muscle weakness (generalized): Secondary | ICD-10-CM | POA: Diagnosis not present

## 2015-03-14 DIAGNOSIS — M25641 Stiffness of right hand, not elsewhere classified: Secondary | ICD-10-CM | POA: Diagnosis not present

## 2015-03-14 NOTE — Therapy (Signed)
[facility] [place], [place], [address] Phone: [phone]   Fax:  [phone]  Occupational Therapy Treatment and reassessment  Patient Details  Name: Jerry Cain MRN: [id number] Date of Birth: [birth year] Referring Provider: Dr. Durward Fortes  Encounter Date: 03/14/2015      OT End of Session - 03/14/15 1236    Visit Number 79   Number of Visits 35   Authorization Type Medicare   Authorization Time Period before 48th visit - add kx modifier to charges going forward   Authorization - Visit Number 107   Authorization - Number of Visits 48   OT Start Time 1015   OT Stop Time 1100   OT Time Calculation (min) 45 min   Activity Tolerance Patient tolerated treatment well   Behavior During Therapy [facility] for tasks assessed/performed      Past Medical History  Diagnosis Date  . Hypercholesterolemia     takes Atorvastatin daily  . Gout   . BPH (benign prostatic hyperplasia)     takes Proscar daily  . CAD (coronary artery disease) 2009    cardiac cath and PTCA by Dr. Tami Ribas  . Tubular adenoma 2001  . Diverticulosis 2007    tcs by Dr. Laural Golden  . GERD (gastroesophageal reflux disease)   . Adenomatous colon polyp 2001  . Osteoarthritis     left knee  . Complication of anesthesia     hard to wake up  . Hypertension     takes Imdur,Coreg,and Lisinopril daily  . Atrial fibrillation ([place])     takes Coumadin daily  . Pneumonia     many,many yrs ago  . Numbness     right hand pointer and middle finger  . Carpal tunnel syndrome     right  . Joint pain   . Joint swelling   . Nocturia   . Hypothyroidism     takes SYnthroid daily  . Anxiety     Past Surgical History  Procedure Laterality Date  . Hernia repair  2009    left inguinal  . Cataract extraction      bilateral  . Colonoscopy  2007    Dr. Laural Golden- L sided diverticulosis. Next TCS 2012 due to h/o tubular adenoma.  . Colonoscopy  02/26/2011    Procedure:  COLONOSCOPY;  Surgeon: Daneil Dolin, MD;  [facility];  Service: Endoscopy;  Laterality: N/A;  11:05  . Tee without cardioversion  12/12/2011    Procedure: TRANSESOPHAGEAL ECHOCARDIOGRAM (TEE);  Surgeon: Sanda Klein, MD;  Location: [facility];  Service: Cardiovascular;  Laterality: N/A;   successful/sinus rhythm  . Cardioversion  12/12/2011    Procedure: CARDIOVERSION;  Surgeon: Sanda Klein, MD;  Location: [place];  Service: Cardiovascular;  Laterality: N/A;  . Bunionectomy Right 03/17/2013    Procedure: Lillard Anes, Arthroplasty 2nd toe right foot;  Surgeon: Marcheta Grammes, DPM;  Location: [facility];  Service: Orthopedics;  Laterality: Right;  . Aiken osteotomy Right 03/17/2013    Procedure: Barbie Banner OSTEOTOMY;  Surgeon: Marcheta Grammes, DPM;  Location: [facility];  Service: Orthopedics;  Laterality: Right;  . Flexor tenotomy Left 03/17/2013    Procedure: PERCUTANEOUS FLEXOR TENOTOMY 3RD TOE LEFT FOOT;  Surgeon: Marcheta Grammes, DPM;  Location: [facility];  Service: Orthopedics;  Laterality: Left;  . Cardiac catheterization  06/11/2007    PTCA X2 in 06/2007:  2 overlapping Cypher stents to LAD (2.5x13 and 3.0x23)  . Cardiac catheterization  06/21/2007  Cypher stent 2.5 x 13  to OM branch  . Coronary angioplasty      x 3   . Total knee arthroplasty Left 06/14/2013    Procedure: TOTAL KNEE ARTHROPLASTY;  Surgeon: Garald Balding, MD;  Location: [place];  Service: Orthopedics;  Laterality: Left;  . Rot Left     There were no vitals filed for this visit.  Visit Diagnosis:  Shoulder weakness  Shoulder stiffness, left  Tight fascia      Subjective Assessment - 03/14/15 1020    Subjective  S: My shoulder has become a whole lot better.    Special Tests FOTO score: 73/100   Currently in Pain? No/denies            [facility] OT Assessment - 03/14/15 1031    Assessment   Diagnosis Left Shoulder RCR, SAD, DCR   Precautions   Precautions Shoulder   Type  of Shoulder Precautions PROM for 4 weeks (until 12/07/14) then progress to AAROM (until 01/04/15). Progress to AROM and progress as tolerated.   AROM   Overall AROM Comments Assessed seated. IR/er adducted.   AROM Assessment Site Shoulder   Right/Left Shoulder Left   Left Shoulder Flexion 120 Degrees  previous: 118   Left Shoulder ABduction 125 Degrees  previous: 102   Left Shoulder Internal Rotation 90 Degrees  previous: 90   Left Shoulder External Rotation 75 Degrees  previous: 70   PROM   Overall PROM Comments Assessed supine. IR/er adducted.   PROM Assessment Site Shoulder   Right/Left Shoulder Left   Left Shoulder Flexion 154 Degrees  previous: 140   Left Shoulder ABduction 180 Degrees  previous: 180   Left Shoulder Internal Rotation 90 Degrees  previous: 90   Left Shoulder External Rotation 90 Degrees  previous: 90   Strength   Overall Strength Comments Assessed seated, er/IR adducted   Strength Assessment Site Shoulder   Right/Left Shoulder Left   Left Shoulder Flexion 5/5  previous: 4+/5   Left Shoulder ABduction 4/5  previous: 4-/5   Left Shoulder Internal Rotation 5/5  previous: 4/5   Left Shoulder External Rotation 3+/5  previous: 3+/5                  OT Treatments/Exercises (OP) - 03/14/15 0001    Exercises   Exercises Shoulder   Shoulder Exercises: ROM/Strengthening   UBE (Upper Arm Bike) 3' forward and 3' reverse at 4.0   Graduated Retraction with Theraband 10 times each with red tband at 120, 90, 45   Sustained Retraction with Theraband 5 times with green tband    Manual Therapy   Manual Therapy Myofascial release   Manual therapy comments Manual therapy completed prior to ROM measurements and treatment exercises.   Myofascial Release Myofascial release to left upper arm, trapezius, and scapularis region to decrease fascial restrictions and increase joint mobility in a pain free zone.                 OT Education - 03/14/15 1235     Education provided Yes   Education Details Reviewed [facility]P and recommended appropriate exercises to continue.    Person(s) Educated Patient   Methods Explanation;Demonstration;Handout   Comprehension Returned demonstration;Verbalized understanding          OT Short Term Goals - 02/23/15 0921    OT SHORT TERM GOAL #1   Title Patient will be education and independent with HEP.   OT SHORT TERM GOAL #2  Title Patient will increase PROM to [facility] to increase ability to get dressed with less difficulty.   OT SHORT TERM GOAL #3   Title Patient will increase strength to 3/5 to increase ability to complete daily tasks below shoulder height.    OT SHORT TERM GOAL #4   Title Patient will decrease fascial restrictions from max to min amount.   OT SHORT TERM GOAL #5   Title Patient will decrease pain during daily tasks to 4/10.           OT Long Term Goals - 21-Mar-2015 1237    OT LONG TERM GOAL #1   Title Patient will return to highest level of independence with all daily tasks using LUE.   Status Achieved   OT LONG TERM GOAL #2   Title Patient will increase AROM to WNL to increase ability to complete daily tasks above shoulder height with less difficulty.    Status Not Met   OT LONG TERM GOAL #3   Title Patient will increase LUE strength to 4/5 to increase ability to hold grandchildren.   Status Partially Met   OT LONG TERM GOAL #4   Title Patient will decrease fascial restrictions to trace amount.   Status Not Met   OT LONG TERM GOAL #5   Title Patient will decrease pain level to 2/10 or less when completing daily tasks.               Plan - 03/21/2015 1238    Clinical Impression Statement A: Reassessment and discharge completed this date. Patient has met all short term goals and 2/5 LTGs with 1 partially met. patient reports that his biggest obstacle is completing shoulder flexion and External rotation strength. Patient was given 2 week trail to [facility].  Patient is in agreement with  discharge.    Plan P: D/C with HEP.          G-Codes - March 21, 2015 1239    Functional Assessment Tool Used FOTO score: 73/100 (27% impaired)   Functional Limitation Carrying, moving and handling objects   Carrying, Moving and Handling Objects Goal Status ([id number]) At least 1 percent but less than 20 percent impaired, limited or restricted   Carrying, Moving and Handling Objects Discharge Status [phone]) At least 20 percent but less than 40 percent impaired, limited or restricted      Problem List Patient Active Problem List   Diagnosis Date Noted  . Arthritis of knee, left 06/16/2013  . S/P total knee replacement using cement 06/14/2013  . Osteoarthritis of left knee 03/21/2013  . BPH (benign prostatic hyperplasia)   . GERD (gastroesophageal reflux disease)   . CAD (coronary artery disease)   . Fever 03/20/2013  . Altered mental state 03/20/2013  . Postoperative fever 03/20/2013  . PNA (pneumonia) 03/20/2013  . Preop cardiovascular exam 11/24/2012  . Peroneal tendonitis 10/05/2012  . Arthritis of foot, degenerative 10/05/2012  . HTN (hypertension) 09/25/2012  . Hyperlipidemia 09/25/2012  . Atrial fibrillation ([place]) 06/15/2012  . Long term (current) use of anticoagulants 06/15/2012  . Rotator cuff strain 02/04/2012  . Shoulder pain 02/04/2012  . Personal history of colonic polyps 01/29/2011  . High risk medication use 01/29/2011  OCCUPATIONAL THERAPY DISCHARGE SUMMARY  Visits from Start of Care: 45  Current functional level related to goals / functional outcomes: See above   Remaining deficits: See above   Education / Equipment: Shoulder strengthening and stretches Plan: Patient agrees to discharge.  Patient goals were partially met. Patient is  being discharged due to meeting the stated rehab goals.  ?????        Ailene Ravel, OTR/L,CBIS  [phone]  03/14/2015, 12:50 PM  [facility] [place], [place], [address] Phone: [phone]   Fax:  [phone]  Name: MCCALL LOMAX MRN: [id number] Date of Birth: 09-12-1931

## 2015-03-15 DIAGNOSIS — G5601 Carpal tunnel syndrome, right upper limb: Secondary | ICD-10-CM | POA: Diagnosis not present

## 2015-03-15 DIAGNOSIS — M71331 Other bursal cyst, right wrist: Secondary | ICD-10-CM | POA: Diagnosis not present

## 2015-03-19 ENCOUNTER — Other Ambulatory Visit: Payer: Self-pay | Admitting: Internal Medicine

## 2015-03-19 ENCOUNTER — Other Ambulatory Visit: Payer: Self-pay | Admitting: Cardiovascular Disease

## 2015-03-19 NOTE — Telephone Encounter (Signed)
REFILL 

## 2015-03-26 ENCOUNTER — Ambulatory Visit (INDEPENDENT_AMBULATORY_CARE_PROVIDER_SITE_OTHER): Payer: Medicare Other | Admitting: *Deleted

## 2015-03-26 DIAGNOSIS — I4891 Unspecified atrial fibrillation: Secondary | ICD-10-CM | POA: Diagnosis not present

## 2015-03-26 DIAGNOSIS — I480 Paroxysmal atrial fibrillation: Secondary | ICD-10-CM

## 2015-03-26 DIAGNOSIS — Z7901 Long term (current) use of anticoagulants: Secondary | ICD-10-CM | POA: Diagnosis not present

## 2015-03-26 LAB — POCT INR: INR: 2

## 2015-03-30 ENCOUNTER — Ambulatory Visit (HOSPITAL_COMMUNITY): Payer: Medicare Other | Admitting: Specialist

## 2015-03-30 DIAGNOSIS — M25612 Stiffness of left shoulder, not elsewhere classified: Secondary | ICD-10-CM | POA: Diagnosis not present

## 2015-03-30 DIAGNOSIS — M6281 Muscle weakness (generalized): Secondary | ICD-10-CM | POA: Diagnosis not present

## 2015-03-30 DIAGNOSIS — M25641 Stiffness of right hand, not elsewhere classified: Secondary | ICD-10-CM | POA: Diagnosis not present

## 2015-03-30 DIAGNOSIS — R279 Unspecified lack of coordination: Secondary | ICD-10-CM

## 2015-03-30 DIAGNOSIS — Z9889 Other specified postprocedural states: Secondary | ICD-10-CM

## 2015-03-30 DIAGNOSIS — M25631 Stiffness of right wrist, not elsewhere classified: Secondary | ICD-10-CM | POA: Diagnosis not present

## 2015-03-30 DIAGNOSIS — M629 Disorder of muscle, unspecified: Secondary | ICD-10-CM | POA: Diagnosis not present

## 2015-03-30 DIAGNOSIS — R29898 Other symptoms and signs involving the musculoskeletal system: Secondary | ICD-10-CM | POA: Diagnosis not present

## 2015-03-30 DIAGNOSIS — M79641 Pain in right hand: Secondary | ICD-10-CM

## 2015-03-30 DIAGNOSIS — M25531 Pain in right wrist: Secondary | ICD-10-CM

## 2015-03-30 NOTE — Therapy (Signed)
[place], [place], [address] Phone: [phone]   Fax:  [phone]  Occupational Therapy Evaluation  Patient Details  Name: Jerry Cain MRN: [id number] Date of Birth: [birth year] Referring Provider: Dr. Durward Fortes  Encounter Date: 03/30/2015      OT End of Session - 03/30/15 1000    Visit Number 1   Number of Visits 16   Date for OT Re-Evaluation 05/29/15  mini reassess on 04/27/14   Authorization Type Medicare   Authorization Time Period before the 10th visit, kx modifier until 04/08/15   Authorization - Visit Number 1   Authorization - Number of Visits 10   OT Start Time 0845   OT Stop Time 0930   OT Time Calculation (min) 45 min   Activity Tolerance Patient tolerated treatment well   Behavior During Therapy [facility] for tasks assessed/performed      Past Medical History  Diagnosis Date  . Hypercholesterolemia     takes Atorvastatin daily  . Gout   . BPH (benign prostatic hyperplasia)     takes Proscar daily  . CAD (coronary artery disease) 2009    cardiac cath and PTCA by Dr. Tami Ribas  . Tubular adenoma 2001  . Diverticulosis 2007    tcs by Dr. Laural Golden  . GERD (gastroesophageal reflux disease)   . Adenomatous colon polyp 2001  . Osteoarthritis     left knee  . Complication of anesthesia     hard to wake up  . Hypertension     takes Imdur,Coreg,and Lisinopril daily  . Atrial fibrillation ([place])     takes Coumadin daily  . Pneumonia     many,many yrs ago  . Numbness     right hand pointer and middle finger  . Carpal tunnel syndrome     right  . Joint pain   . Joint swelling   . Nocturia   . Hypothyroidism     takes SYnthroid daily  . Anxiety     Past Surgical History  Procedure Laterality Date  . Hernia repair  2009    left inguinal  . Cataract extraction      bilateral  . Colonoscopy  2007    Dr. Laural Golden- L sided diverticulosis. Next TCS 2012 due to h/o tubular adenoma.  .  Colonoscopy  02/26/2011    Procedure: COLONOSCOPY;  Surgeon: Daneil Dolin, MD;  [facility];  Service: Endoscopy;  Laterality: N/A;  11:05  . Tee without cardioversion  12/12/2011    Procedure: TRANSESOPHAGEAL ECHOCARDIOGRAM (TEE);  Surgeon: Sanda Klein, MD;  Location: [facility] ENDOSCOPY;  Service: Cardiovascular;  Laterality: N/A;   successful/sinus rhythm  . Cardioversion  12/12/2011    Procedure: CARDIOVERSION;  Surgeon: Sanda Klein, MD;  Location: [place];  Service: Cardiovascular;  Laterality: N/A;  . Bunionectomy Right 03/17/2013    Procedure: Lillard Anes, Arthroplasty 2nd toe right foot;  Surgeon: Marcheta Grammes, DPM;  Location: [facility];  Service: Orthopedics;  Laterality: Right;  . Aiken osteotomy Right 03/17/2013    Procedure: Barbie Banner OSTEOTOMY;  Surgeon: Marcheta Grammes, DPM;  Location: [facility];  Service: Orthopedics;  Laterality: Right;  . Flexor tenotomy Left 03/17/2013    Procedure: PERCUTANEOUS FLEXOR TENOTOMY 3RD TOE LEFT FOOT;  Surgeon: Marcheta Grammes, DPM;  Location: [facility];  Service: Orthopedics;  Laterality: Left;  . Cardiac catheterization  06/11/2007    PTCA X2 in 06/2007:  2 overlapping Cypher stents to LAD (2.5x13 and 3.0x23)  .  Cardiac catheterization  06/21/2007    Cypher stent 2.5 x 13  to OM branch  . Coronary angioplasty      x 3   . Total knee arthroplasty Left 06/14/2013    Procedure: TOTAL KNEE ARTHROPLASTY;  Surgeon: Garald Balding, MD;  Location: [place];  Service: Orthopedics;  Laterality: Left;  . Rot Left     There were no vitals filed for this visit.  Visit Diagnosis:  Hand pain, right - Plan: Ot plan of care cert/re-cert  Right wrist pain - Plan: Ot plan of care cert/re-cert  Wrist stiffness, right - Plan: Ot plan of care cert/re-cert  Stiffness of joint, hand, right - Plan: Ot plan of care cert/re-cert  S/P carpal tunnel release - Plan: Ot plan of care cert/re-cert  Muscle weakness - Plan: Ot plan of care  cert/re-cert  Lack of coordination - Plan: Ot plan of care cert/re-cert      Subjective Assessment - 03/30/15 0939    Subjective  S:  I had surgery on the 8th.  My hand is really swollen.   Pertinent History Patient is an 81 year old male with past medical history that includes total knee replacement, left rotator cuff repair, heart catheritization, hypertension, high cholesterol, and GERD.  He had right carpal tunnel release surgery on 03/15/15.  He presents this date for occupational therapy evaluation and treatment.  He has steri strips in place, and splint use was discontinued by this surgeon on 03/28/15.     Special Tests grip strength, pinch strength   Currently in Pain? Yes   Pain Score 3    Pain Location Hand   Pain Orientation Right   Pain Descriptors / Indicators Aching   Pain Type Acute pain   Pain Onset 1 to 4 weeks ago   Pain Frequency Constant   Aggravating Factors  movement of wrist or digits   Pain Relieving Factors rest   Effect of Pain on Daily Activities unable to use dominant hand with functional activities            [facility] OT Assessment - 03/30/15 0001    Assessment   Diagnosis S/P right carpal tunnel release   Referring Provider Dr. 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   Leisure Patient enjoys hunting, skeet shooting, and playing with his grandchildren   ADL   ADL comments Patient is unable to use his  dominant right hand to handle household objects, unable to grasp items or open containers, unable to don his coat   Vision - History   Baseline Vision No visual deficits   Cognition   Overall Cognitive Status Within Functional Limits for tasks assessed   Observation/Other Assessments   Focus on Therapeutic Outcomes (FOTO)  assess at next visit   Sensation   Light Touch Appears Intact   Coordination   Gross Motor Movements are Fluid and Coordinated Yes  9 Hole Peg Test --  complete at next visit   Edema   Edema MCPJs:  right 24.0 cm Left 22.5 cm, wrist crease:  right 21.3 cm left 18.5 cm   ROM / Strength   AROM / PROM / Strength AROM;PROM;Strength   Palpation   Palpation comment mod fascial restrictions in right flexor/extensor forearm and hand.  healing surgical incision approx 3 inches in length over carpal tunnel region of right hand    AROM   Overall AROM Comments assessed in seated   AROM Assessment Site Wrist;Forearm;Finger   Right/Left Shoulder Right   Right/Left Forearm Right   Right Forearm Pronation 88 Degrees   Right Forearm Supination 58 Degrees   Right/Left Wrist Right   Right Wrist Extension 20 Degrees   Right Wrist Flexion 64 Degrees   Right Wrist Radial Deviation 10 Degrees   Right Wrist Ulnar Deviation 28 Degrees   PROM   Overall PROM Comments assessed in seated and WFL   Right/Left Wrist Right   Strength   Overall Strength Comments not assessed due to recent surgery   Right Hand PROM   R Thumb MCP 0-60 50 Degrees   R Thumb IP 0-80 32 Degrees   R Index  MCP 0-90 38 Degrees   R Index PIP 0-100 70 Degrees   R Index DIP 0-70 30 Degrees   R Long  MCP 0-90 54 Degrees   R Long PIP 0-100 80 Degrees   R Long DIP 0-70 40 Degrees   R Ring  MCP 0-90 54 Degrees   R Ring PIP 0-100 80 Degrees   R Ring DIP 0-70 28 Degrees   R Little  MCP 0-90 60 Degrees   R Little PIP 0-100 0 Degrees   R Little DIP 0-70 41 Degrees   Hand Function   Right Hand Grip (lbs) 15    Right Hand Lateral Pinch 8 lbs   Right Hand 3 Point Pinch 1 lbs   Left Hand Grip (lbs) 50   Left Hand Lateral Pinch 14 lbs   Left 3 point pinch 14 lbs                  OT Treatments/Exercises (OP) - 03/30/15 0001    Manual Therapy   Manual Therapy Myofascial release   Manual therapy comments Manual therapy completed prior to ROM measurements and treatment exercises.   Myofascial Release myofascial release to right flexor and extensor forearm, hand, and digits with P/ROM to decrease pain and improve mobility in right hand needed to return to priorl level of independence               OT Education - 03/30/15 0953    Education provided Yes   Education Details A/ROM supination, pronation, wrist flexion, extension, radial deviation, tendon glides, use of edema glove for edema management and digit opposition   Person(s) Educated Patient   Methods Explanation;Demonstration;Handout   Comprehension Verbalized understanding;Returned demonstration          OT Short Term Goals - 03/30/15 1005    OT SHORT TERM GOAL #1   Title Patient will be education and independent with HEP for improved functional use of right hand.   Time 4   Period Weeks   Status New   OT SHORT TERM GOAL #2   Title Patient will increase right wrist, forearm, and hand P/ROM to [facility] to increase ability to get dressed with less difficulty.   Time 4   Period Weeks  Status New   OT SHORT TERM GOAL #3   Title Patient will increase right strength to 3+/5 to increase ability to pick up and stabalize household items.   Time 4   Period Weeks   Status New   OT SHORT TERM GOAL #4   Title Patient will improve right grip strength by 10 pounds and pinch strength by 4# for improved ability to open jars.   Time 4   Period Weeks   Status New   OT SHORT TERM GOAL #5   Title Patient will improve fine motor coordination by decreasing completion time on nine hole peg test by 25% for improved ability to fasten  buttons and tie his shoes.   Time 4   Period Weeks   Additional Short Term Goals   Additional Short Term Goals Yes   OT SHORT TERM GOAL #6   Title Patient will decrease edema in his right hand by 1.0 cm for increased use of his right hand with functional tasks.    Time 4   Period Weeks   Status New   OT SHORT TERM GOAL #7   Title Patient will decrease pain in his right hand and wrist to 2/10 or better for improved use of his right hand with functional tasks.            OT Long Term Goals - 03/30/15 1009    OT LONG TERM GOAL #1   Title Patient will return to highest level of independence with all daily tasks usingRUE.   Time 8   Period Weeks   Status New   OT LONG TERM GOAL #2   Title Patient will increase AROM in right wrist and hand  to WNL to increase ability to complete daily tasks and manipulate household objects.    Time 8   Period Weeks   Status New   OT LONG TERM GOAL #3   Title Patient will increase LUE forearm and wrist strength to 5/5 to increase ability to hold grandchildren.   Time 8   Period Weeks   Status New   OT LONG TERM GOAL #4   Title Patient will decrease fascial restrictions to trace amount in his right hand and wrist.   Time 8   Period Weeks   Status New   OT LONG TERM GOAL #5   Title Patient will decrease pain level to 1/10 in his right wrist and hand  when completing daily tasks.   Time 8   Period Weeks   Status New   Long Term Additional Goals   Additional Long Term Goals Yes   OT LONG TERM GOAL #6   Title Patient will improve right hand grip strength to 45# or more and pinch strength to 12# or better for improved ability to grip his rifle and pull trigger.   Time 8   Period Weeks   Status New   OT LONG TERM GOAL #7   Title Patient will decrease edema in his right hand by 1.5 cm or more for improved functional use of his right hand.    Time 8   Period Weeks   Status New   OT LONG TERM GOAL #8   Title Patient will have WNL fine motor  coordination in his right hand for increased independence fastening buttons.    Time 8   Period Weeks   Status New               Plan - 03/30/15 1001  Clinical Impression Statement A: Jerry Cain is an 81 year old male with medical history that includes knee replacement, left rotator cuff repair, heart surgery, hypertension, GERD.  Mr. Carico had right carpal tunnel release surgery on 03/15/15 and has experienced significant edema and stiffness in his right hand since his surgery.  He is unable to use his dominant right hand with any functional activities due to the following deficits:   Pt will benefit from skilled therapeutic intervention in order to improve on the following deficits (Retired) Decreased coordination;Decreased range of motion;Decreased skin integrity;Decreased scar mobility;Decreased strength;Increased fascial restricitons;Increased edema;Impaired flexibility;Pain   Rehab Potential Good   OT Frequency 2x / week   OT Duration 8 weeks   OT Treatment/Interventions Self-care/ADL training;Moist Heat;Contrast Bath;Parrafin;Ultrasound;Therapeutic exercise;Neuromuscular education;Passive range of motion;Scar mobilization;Manual Therapy;Splinting;Therapeutic exercises;Therapeutic activities;Patient/family education   Plan P:  Skilled OT intervention to return to prior level of independence with all B/IADLs, work and leisure activities, using right hand as dominant.  Next session:  complete nine hole peg test, review treatment plan, manual therapy, A/P/ROM and progress as tolerated.    Consulted and Agree with Plan of Care Patient          G-Codes - 2015-04-21 1014    Functional Assessment Tool Used grip strength right vs left 15# vs 50# 30% independnt 70% limitied    Functional Limitation Carrying, moving and handling objects   Carrying, Moving and Handling Objects Current Status [id number]) At least 60 percent but less than 80 percent impaired, limited or restricted   Carrying,  Moving and Handling Objects Goal Status [id number]) At least 1 percent but less than 20 percent impaired, limited or restricted      Problem List Patient Active Problem List   Diagnosis Date Noted  . Arthritis of knee, left 06/16/2013  . S/P total knee replacement using cement 06/14/2013  . Osteoarthritis of left knee 03/21/2013  . BPH (benign prostatic hyperplasia)   . GERD (gastroesophageal reflux disease)   . CAD (coronary artery disease)   . Fever 03/20/2013  . Altered mental state 03/20/2013  . Postoperative fever 03/20/2013  . PNA (pneumonia) 03/20/2013  . Preop cardiovascular exam 11/24/2012  . Peroneal tendonitis 10/05/2012  . Arthritis of foot, degenerative 10/05/2012  . HTN (hypertension) 09/25/2012  . Hyperlipidemia 09/25/2012  . Atrial fibrillation ([place]) 06/15/2012  . Long term (current) use of anticoagulants 06/15/2012  . Rotator cuff strain 02/04/2012  . Shoulder pain 02/04/2012  . Personal history of colonic polyps 01/29/2011  . High risk medication use 01/29/2011    Vangie Bicker, OTR/L [phone]  04-21-2015, 10:17 AM  [place] [address] [place], [place], [address] Phone: [phone]   Fax:  [phone]  Name: Jerry Cain MRN: [id number] Date of Birth: 05/13/1931

## 2015-03-30 NOTE — Patient Instructions (Signed)
COMPLETE EACH EXERCISE 10 TIMES 2-3 TIMES PER DAY  AROM: Wrist Flexion / Extension  Actively bend right wrist forward then back as far as possible. Repeat ____ times per set. Do ____ sets per session. Do ____ sessions per day.  Copyright  VHI. All rights reserved.  AROM: Wrist Radial / Ulnar Deviation   Gently bend left wrist from side to side as far as possible. Repeat ____ times per set. Do ____ sets per session. Do ____ sessions per day.  Copyright  VHI. All rights reserved.  AROM: Forearm Pronation / Supination   With right arm in handshake position, slowly rotate palm down until stretch is felt. Relax. Then rotate palm up until stretch is felt. Repeat ____ times per set. Do ____ sets per session. Do ____ sessions per day.  Copyright  VHI. All rights reserved.

## 2015-04-04 ENCOUNTER — Ambulatory Visit (HOSPITAL_COMMUNITY): Payer: Medicare Other

## 2015-04-04 ENCOUNTER — Encounter (HOSPITAL_COMMUNITY): Payer: Self-pay

## 2015-04-04 DIAGNOSIS — M25641 Stiffness of right hand, not elsewhere classified: Secondary | ICD-10-CM | POA: Diagnosis not present

## 2015-04-04 DIAGNOSIS — M25631 Stiffness of right wrist, not elsewhere classified: Secondary | ICD-10-CM | POA: Diagnosis not present

## 2015-04-04 DIAGNOSIS — M629 Disorder of muscle, unspecified: Secondary | ICD-10-CM | POA: Diagnosis not present

## 2015-04-04 DIAGNOSIS — M25612 Stiffness of left shoulder, not elsewhere classified: Secondary | ICD-10-CM | POA: Diagnosis not present

## 2015-04-04 DIAGNOSIS — M6289 Other specified disorders of muscle: Secondary | ICD-10-CM

## 2015-04-04 DIAGNOSIS — R279 Unspecified lack of coordination: Secondary | ICD-10-CM

## 2015-04-04 DIAGNOSIS — R29898 Other symptoms and signs involving the musculoskeletal system: Secondary | ICD-10-CM | POA: Diagnosis not present

## 2015-04-04 DIAGNOSIS — M6281 Muscle weakness (generalized): Secondary | ICD-10-CM | POA: Diagnosis not present

## 2015-04-04 NOTE — Therapy (Signed)
[place], [place], [address] Phone: [phone]   Fax:  [phone]  Occupational Therapy Treatment  Patient Details  Name: Jerry Cain MRN: [id number] Date of Birth: Sep 23, 1931 Referring Provider: Dr. Durward Fortes  Encounter Date: 04/04/2015      OT End of Session - 04/04/15 1156    Visit Number 2   Number of Visits 16   Date for OT Re-Evaluation 05/29/15  mini reassess on 04/27/14   Authorization Type Medicare   Authorization Time Period before the 10th visit, kx modifier until 04/08/15   Authorization - Visit Number 2   Authorization - Number of Visits 10   OT Start Time 1020   OT Stop Time 1105   OT Time Calculation (min) 45 min   Activity Tolerance Patient tolerated treatment well   Behavior During Therapy [facility] for tasks assessed/performed      Past Medical History  Diagnosis Date  . Hypercholesterolemia     takes Atorvastatin daily  . Gout   . BPH (benign prostatic hyperplasia)     takes Proscar daily  . CAD (coronary artery disease) 2009    cardiac cath and PTCA by Dr. Tami Ribas  . Tubular adenoma 2001  . Diverticulosis 2007    tcs by Dr. Laural Golden  . GERD (gastroesophageal reflux disease)   . Adenomatous colon polyp 2001  . Osteoarthritis     left knee  . Complication of anesthesia     hard to wake up  . Hypertension     takes Imdur,Coreg,and Lisinopril daily  . Atrial fibrillation ([place])     takes Coumadin daily  . Pneumonia     many,many yrs ago  . Numbness     right hand pointer and middle finger  . Carpal tunnel syndrome     right  . Joint pain   . Joint swelling   . Nocturia   . Hypothyroidism     takes SYnthroid daily  . Anxiety     Past Surgical History  Procedure Laterality Date  . Hernia repair  2009    left inguinal  . Cataract extraction      bilateral  . Colonoscopy  2007    Dr. Laural Golden- L sided diverticulosis. Next TCS 2012 due to h/o tubular adenoma.  .  Colonoscopy  02/26/2011    Procedure: COLONOSCOPY;  Surgeon: Daneil Dolin, MD;  [facility];  Service: Endoscopy;  Laterality: N/A;  11:05  . Tee without cardioversion  12/12/2011    Procedure: TRANSESOPHAGEAL ECHOCARDIOGRAM (TEE);  Surgeon: Sanda Klein, MD;  Location: [facility] ENDOSCOPY;  Service: Cardiovascular;  Laterality: N/A;   successful/sinus rhythm  . Cardioversion  12/12/2011    Procedure: CARDIOVERSION;  Surgeon: Sanda Klein, MD;  Location: [facility];  Service: Cardiovascular;  Laterality: N/A;  . Bunionectomy Right 03/17/2013    Procedure: Lillard Anes, Arthroplasty 2nd toe right foot;  Surgeon: Marcheta Grammes, DPM;  Location: [facility];  Service: Orthopedics;  Laterality: Right;  . Aiken osteotomy Right 03/17/2013    Procedure: Barbie Banner OSTEOTOMY;  Surgeon: Marcheta Grammes, DPM;  Location: [facility];  Service: Orthopedics;  Laterality: Right;  . Flexor tenotomy Left 03/17/2013    Procedure: PERCUTANEOUS FLEXOR TENOTOMY 3RD TOE LEFT FOOT;  Surgeon: Marcheta Grammes, DPM;  [facility]: [facility];  Service: Orthopedics;  Laterality: Left;  . Cardiac catheterization  06/11/2007    PTCA X2 in 06/2007:  2 overlapping Cypher stents to LAD (2.5x13 and 3.0x23)  .  Cardiac catheterization  06/21/2007    Cypher stent 2.5 x 13  to OM branch  . Coronary angioplasty      x 3   . Total knee arthroplasty Left 06/14/2013    Procedure: TOTAL KNEE ARTHROPLASTY;  Surgeon: Garald Balding, MD;  Location: [facility];  Service: Orthopedics;  Laterality: Left;  . Rot Left     There were no vitals filed for this visit.  Visit Diagnosis:  Wrist stiffness, right  Stiffness of joint, hand, right  Muscle weakness  Lack of coordination  Tight fascia      Subjective Assessment - 04/04/15 1047    Subjective  S: I did my exercises every day up until Christmas and that is when my hand really swelled up and I coudn't do anything with it.    Special Tests FOTO score: 50/100   Currently  in Pain? No/denies            [facility] OT Assessment - 04/04/15 1026    Assessment   Diagnosis S/P right carpal tunnel release   Precautions   Precautions None   Type of Shoulder Precautions n/a   Coordination   9 Hole Peg Test Right;Left   Right 9 Hole Peg Test 34.5"   Left 9 Hole Peg Test 27.8"                  OT Treatments/Exercises (OP) - 04/04/15 1049    Exercises   Exercises Hand;Wrist;Elbow   Elbow Exercises   Forearm Supination AROM;10 reps   Forearm Pronation AROM;10 reps   Wrist Flexion AROM;10 reps   Wrist Extension AROM;10 reps   Wrist Exercises   Wrist Radial Deviation AROM;10 reps   Wrist Ulnar Deviation AROM;10 reps   Additional Wrist Exercises   Sponges 6,7   Hand Exercises   MCPJ Flexion AROM;10 reps   Opposition AROM;5 reps   Digit Abduction/Adduction 10X   Other Hand Exercises 10 gross grasp squeezes with towel roll   Other Hand Exercises Washcloth crumple 10X   Fine Motor Coordination   Other Fine Motor Exercises Resistive Clothespins yellow, red, green and 8/11 blue placed using 3 point pinch and removed with lateral pinch. >avg time to complete. min-mod difficulty.    Manual Therapy   Manual Therapy Myofascial release   Manual therapy comments Manual therapy completed prior to ROM measurements and treatment exercises.   Myofascial Release Myofascial release, edema management and manual stretching completed to right hand and wrist to decrease fascial restrictions and increase joint mobility in a pain free zone.                 OT Education - 04/04/15 1154    Education provided Yes   Education Details Patient given print out of OT evaluation and goals reviewed.    Person(s) Educated Patient   Methods Explanation;Demonstration;Verbal cues;Handout   Comprehension Verbalized understanding;Returned demonstration          OT Short Term Goals - 04/04/15 1200    OT SHORT TERM GOAL #1   Title Patient will be education and  independent with HEP for improved functional use of right hand.   Time 4   Period Weeks   Status On-going   OT SHORT TERM GOAL #2   Title Patient will increase right wrist, forearm, and hand P/ROM to [facility] to increase ability to get dressed with less difficulty.   Time 4   Period Weeks   Status On-going   OT SHORT TERM GOAL #  3   Title Patient will increase right strength to 3+/5 to increase ability to pick up and stabalize household items.   Time 4   Period Weeks   Status On-going   OT SHORT TERM GOAL #4   Title Patient will improve right grip strength by 10 pounds and pinch strength by 4# for improved ability to open jars.   Time 4   Period Weeks   Status On-going   OT SHORT TERM GOAL #5   Title Patient will improve fine motor coordination by decreasing completion time on nine hole peg test by 25% for improved ability to fasten buttons and tie his shoes.   Time 4   Period Weeks   Status On-going   OT SHORT TERM GOAL #6   Title Patient will decrease edema in his right hand by 1.0 cm for increased use of his right hand with functional tasks.    Time 4   Period Weeks   Status On-going   OT SHORT TERM GOAL #7   Title Patient will decrease pain in his right hand and wrist to 2/10 or better for improved use of his right hand with functional tasks.    Status On-going           OT Long Term Goals - 04/04/15 1200    OT LONG TERM GOAL #1   Title Patient will return to highest level of independence with all daily tasks usingRUE.   Time 8   Period Weeks   Status On-going   OT LONG TERM GOAL #2   Title Patient will increase AROM in right wrist and hand  to WNL to increase ability to complete daily tasks and manipulate household objects.    Time 8   Period Weeks   Status On-going   OT LONG TERM GOAL #3   Title Patient will increase RUE forearm and wrist strength to 5/5 to increase ability to hold grandchildren.   Time 8   Period Weeks   Status On-going   OT LONG TERM GOAL #4    Title Patient will decrease fascial restrictions to trace amount in his right hand and wrist.   Time 8   Period Weeks   Status On-going   OT LONG TERM GOAL #5   Title Patient will decrease pain level to 1/10 in his right wrist and hand  when completing daily tasks.   Time 8   Period Weeks   Status On-going   OT LONG TERM GOAL #6   Title Patient will improve right hand grip strength to 45# or more and pinch strength to 12# or better for improved ability to grip his rifle and pull trigger.   Time 8   Period Weeks   Status On-going   OT LONG TERM GOAL #7   Title Patient will decrease edema in his right hand by 1.5 cm or more for improved functional use of his right hand.    Time 8   Period Weeks   Status On-going   OT LONG TERM GOAL #8   Title Patient will have WNL fine motor coordination in his right hand for increased independence fastening buttons.    Time 8   Period Weeks   Status On-going               Plan - 04/04/15 1157    Clinical Impression Statement A: Initiated myofascial release, manual stretching, and A/ROM exercises for right hand and wrist. Patient tolerated well with VC for form and  techinque. Did complain of mild soreness during active ROM.    Plan P: Complete grooved pegboard. Use theraputty once incision is completely healed and steri strips are off.         Problem List Patient Active Problem List   Diagnosis Date Noted  . Arthritis of knee, left 06/16/2013  . S/P total knee replacement using cement 06/14/2013  . Osteoarthritis of left knee 03/21/2013  . BPH (benign prostatic hyperplasia)   . GERD (gastroesophageal reflux disease)   . CAD (coronary artery disease)   . Fever 03/20/2013  . Altered mental state 03/20/2013  . Postoperative fever 03/20/2013  . PNA (pneumonia) 03/20/2013  . Preop cardiovascular exam 11/24/2012  . Peroneal tendonitis 10/05/2012  . Arthritis of foot, degenerative 10/05/2012  . HTN (hypertension) 09/25/2012  .  Hyperlipidemia 09/25/2012  . Atrial fibrillation ([place]) 06/15/2012  . Long term (current) use of anticoagulants 06/15/2012  . Rotator cuff strain 02/04/2012  . Shoulder pain 02/04/2012  . Personal history of colonic polyps 01/29/2011  . High risk medication use 01/29/2011    Ailene Ravel, OTR/L,CBIS  [phone]  04/04/2015, 12:01 PM  [facility] [address] [place], [place], [address] Phone: [phone]   Fax:  [phone]  Name: DONA TOWSEND MRN: [id number] Date of Birth: 29-Sep-1931

## 2015-04-05 ENCOUNTER — Other Ambulatory Visit: Payer: Self-pay | Admitting: Cardiovascular Disease

## 2015-04-05 DIAGNOSIS — R972 Elevated prostate specific antigen [PSA]: Secondary | ICD-10-CM | POA: Diagnosis not present

## 2015-04-06 ENCOUNTER — Ambulatory Visit (HOSPITAL_COMMUNITY): Payer: Medicare Other | Admitting: Occupational Therapy

## 2015-04-06 ENCOUNTER — Encounter (HOSPITAL_COMMUNITY): Payer: Self-pay | Admitting: Occupational Therapy

## 2015-04-06 ENCOUNTER — Other Ambulatory Visit: Payer: Self-pay | Admitting: Cardiovascular Disease

## 2015-04-06 DIAGNOSIS — M6281 Muscle weakness (generalized): Secondary | ICD-10-CM

## 2015-04-06 DIAGNOSIS — R29898 Other symptoms and signs involving the musculoskeletal system: Secondary | ICD-10-CM | POA: Diagnosis not present

## 2015-04-06 DIAGNOSIS — M629 Disorder of muscle, unspecified: Secondary | ICD-10-CM | POA: Diagnosis not present

## 2015-04-06 DIAGNOSIS — M25631 Stiffness of right wrist, not elsewhere classified: Secondary | ICD-10-CM

## 2015-04-06 DIAGNOSIS — M25612 Stiffness of left shoulder, not elsewhere classified: Secondary | ICD-10-CM | POA: Diagnosis not present

## 2015-04-06 DIAGNOSIS — M25531 Pain in right wrist: Secondary | ICD-10-CM

## 2015-04-06 DIAGNOSIS — M25641 Stiffness of right hand, not elsewhere classified: Secondary | ICD-10-CM | POA: Diagnosis not present

## 2015-04-06 DIAGNOSIS — R279 Unspecified lack of coordination: Secondary | ICD-10-CM

## 2015-04-06 DIAGNOSIS — M6289 Other specified disorders of muscle: Secondary | ICD-10-CM

## 2015-04-06 DIAGNOSIS — M79641 Pain in right hand: Secondary | ICD-10-CM

## 2015-04-06 MED ORDER — ROSUVASTATIN CALCIUM 10 MG PO TABS
ORAL_TABLET | ORAL | Status: DC
Start: 2015-04-06 — End: 2015-05-05

## 2015-04-06 MED ORDER — CARVEDILOL 6.25 MG PO TABS: 6.2500 mg | ORAL_TABLET | Freq: Two times a day (BID) | ORAL | Status: DC

## 2015-04-06 NOTE — Telephone Encounter (Signed)
E SENT TO PHARMACY 

## 2015-04-06 NOTE — Therapy (Signed)
[place], [place], [address] Phone: [phone]   Fax:  [phone]  Occupational Therapy Treatment  Patient Details  Name: Jerry Cain MRN: [id number] Date of Birth: 1931-06-11 Referring Provider: Dr. Durward Fortes  Encounter Date: 04/06/2015      OT End of Session - 04/06/15 1603    Visit Number 3   Number of Visits 16   Date for OT Re-Evaluation 05/29/15  mini reassess on 04/27/14   Authorization Type Medicare   Authorization Time Period before the 10th visit, kx modifier until 04/08/15   Authorization - Visit Number 3   Authorization - Number of Visits 10   OT Start Time [id number]   OT Stop Time 1555   OT Time Calculation (min) 38 min   Activity Tolerance Patient tolerated treatment well   Behavior During Therapy [facility] for tasks assessed/performed      Past Medical History  Diagnosis Date  . Hypercholesterolemia     takes Atorvastatin daily  . Gout   . BPH (benign prostatic hyperplasia)     takes Proscar daily  . CAD (coronary artery disease) 2009    cardiac cath and PTCA by Dr. Tami Ribas  . Tubular adenoma 2001  . Diverticulosis 2007    tcs by Dr. Laural Golden  . GERD (gastroesophageal reflux disease)   . Adenomatous colon polyp 2001  . Osteoarthritis     left knee  . Complication of anesthesia     hard to wake up  . Hypertension     takes Imdur,Coreg,and Lisinopril daily  . Atrial fibrillation ([place])     takes Coumadin daily  . Pneumonia     many,many yrs ago  . Numbness     right hand pointer and middle finger  . Carpal tunnel syndrome     right  . Joint pain   . Joint swelling   . Nocturia   . Hypothyroidism     takes SYnthroid daily  . Anxiety     Past Surgical History  Procedure Laterality Date  . Hernia repair  2009    left inguinal  . Cataract extraction      bilateral  . Colonoscopy  2007    Dr. Laural Golden- L sided diverticulosis. Next TCS 2012 due to h/o tubular adenoma.  .  Colonoscopy  02/26/2011    Procedure: COLONOSCOPY;  Surgeon: Daneil Dolin, MD;  [facility];  Service: Endoscopy;  Laterality: N/A;  11:05  . Tee without cardioversion  12/12/2011    Procedure: TRANSESOPHAGEAL ECHOCARDIOGRAM (TEE);  Surgeon: Sanda Klein, MD;  Location: [facility] ENDOSCOPY;  Service: Cardiovascular;  Laterality: N/A;   successful/sinus rhythm  . Cardioversion  12/12/2011    Procedure: CARDIOVERSION;  Surgeon: Sanda Klein, MD;  Location: [facility];  Service: Cardiovascular;  Laterality: N/A;  . Bunionectomy Right 03/17/2013    Procedure: Lillard Anes, Arthroplasty 2nd toe right foot;  Surgeon: Marcheta Grammes, DPM;  [facility];  Service: Orthopedics;  Laterality: Right;  . Aiken osteotomy Right 03/17/2013    Procedure: Barbie Banner OSTEOTOMY;  Surgeon: Marcheta Grammes, DPM;  Location: [facility];  Service: Orthopedics;  Laterality: Right;  . Flexor tenotomy Left 03/17/2013    Procedure: PERCUTANEOUS FLEXOR TENOTOMY 3RD TOE LEFT FOOT;  Surgeon: Marcheta Grammes, DPM;  Location[facility];  Service: Orthopedics;  Laterality: Left;  . Cardiac catheterization  06/11/2007    PTCA X2 in 06/2007:  2 overlapping Cypher stents to LAD (2.5x13 and 3.0x23)  .  Cardiac catheterization  06/21/2007    Cypher stent 2.5 x 13  to OM branch  . Coronary angioplasty      x 3   . Total knee arthroplasty Left 06/14/2013    Procedure: TOTAL KNEE ARTHROPLASTY;  Surgeon: Garald Balding, MD;  Location: [facility];  Service: Orthopedics;  Laterality: Left;  . Rot Left     There were no vitals filed for this visit.  Visit Diagnosis:  Wrist stiffness, right  Stiffness of joint, hand, right  Muscle weakness  Lack of coordination  Tight fascia  Hand pain, right  Right wrist pain      Subjective Assessment - 04/06/15 1519    Subjective  S: It hasn't been swollen today like it has been being.    Currently in Pain? No/denies            [facility] OT Assessment - 04/06/15  1515    Assessment   Diagnosis S/P right carpal tunnel release   Precautions   Precautions None   Type of Shoulder Precautions n/a                  OT Treatments/Exercises (OP) - 04/06/15 1520    Exercises   Exercises Hand;Wrist;Elbow   Elbow Exercises   Forearm Supination AROM;10 reps   Forearm Pronation AROM;10 reps   Wrist Flexion AROM;10 reps   Wrist Extension AROM;10 reps   Wrist Exercises   Wrist Radial Deviation AROM;10 reps   Wrist Ulnar Deviation AROM;10 reps   Additional Wrist Exercises   Sponges 8, 9   Hand Exercises   MCPJ Flexion AROM;15 reps   Opposition AROM;5 reps   Digit Abduction/Adduction 10X   Other Hand Exercises 10 gross grasp squeezes with towel roll   Other Hand Exercises Washcloth crumple 10X   Fine Motor Coordination   Fine Motor Coordination Grooved pegs;Small Pegboard   Small Pegboard Attempted small pegboard, pt able to place 3 pegs before sharp "shocking pain" began in wrist   Grooved pegs Attempted grooved pegs, pt able to place 2 pegs before sharp "shocking" pain began in wrist   Manual Therapy   Manual Therapy Myofascial release   Manual therapy comments Manual therapy completed prior to ROM measurements and treatment exercises.   Myofascial Release Myofascial release, edema management and manual stretching completed to right hand and wrist to decrease fascial restrictions and increase joint mobility in a pain free zone.                   OT Short Term Goals - 04/04/15 1200    OT SHORT TERM GOAL #1   Title Patient will be education and independent with HEP for improved functional use of right hand.   Time 4   Period Weeks   Status On-going   OT SHORT TERM GOAL #2   Title Patient will increase right wrist, forearm, and hand P/ROM to [facility] to increase ability to get dressed with less difficulty.   Time 4   Period Weeks   Status On-going   OT SHORT TERM GOAL #3   Title Patient will increase right strength to 3+/5 to  increase ability to pick up and stabalize household items.   Time 4   Period Weeks   Status On-going   OT SHORT TERM GOAL #4   Title Patient will improve right grip strength by 10 pounds and pinch strength by 4# for improved ability to open jars.   Time 4   Period Weeks  Status On-going   OT SHORT TERM GOAL #5   Title Patient will improve fine motor coordination by decreasing completion time on nine hole peg test by 25% for improved ability to fasten buttons and tie his shoes.   Time 4   Period Weeks   Status On-going   OT SHORT TERM GOAL #6   Title Patient will decrease edema in his right hand by 1.0 cm for increased use of his right hand with functional tasks.    Time 4   Period Weeks   Status On-going   OT SHORT TERM GOAL #7   Title Patient will decrease pain in his right hand and wrist to 2/10 or better for improved use of his right hand with functional tasks.    Status On-going           OT Long Term Goals - 04/04/15 1200    OT LONG TERM GOAL #1   Title Patient will return to highest level of independence with all daily tasks usingRUE.   Time 8   Period Weeks   Status On-going   OT LONG TERM GOAL #2   Title Patient will increase AROM in right wrist and hand  to WNL to increase ability to complete daily tasks and manipulate household objects.    Time 8   Period Weeks   Status On-going   OT LONG TERM GOAL #3   Title Patient will increase RUE forearm and wrist strength to 5/5 to increase ability to hold grandchildren.   Time 8   Period Weeks   Status On-going   OT LONG TERM GOAL #4   Title Patient will decrease fascial restrictions to trace amount in his right hand and wrist.   Time 8   Period Weeks   Status On-going   OT LONG TERM GOAL #5   Title Patient will decrease pain level to 1/10 in his right wrist and hand  when completing daily tasks.   Time 8   Period Weeks   Status On-going   OT LONG TERM GOAL #6   Title Patient will improve right hand grip  strength to 45# or more and pinch strength to 12# or better for improved ability to grip his rifle and pull trigger.   Time 8   Period Weeks   Status On-going   OT LONG TERM GOAL #7   Title Patient will decrease edema in his right hand by 1.5 cm or more for improved functional use of his right hand.    Time 8   Period Weeks   Status On-going   OT LONG TERM GOAL #8   Title Patient will have WNL fine motor coordination in his right hand for increased independence fastening buttons.    Time 8   Period Weeks   Status On-going               Plan - 04/06/15 1604    Clinical Impression Statement A: Pt completed A/ROM exercises with verbal cuing for form and speed. Pt complains of arthritis pain in dorsal wrist during A/ROM exercises. Attempted grooved and small pegboards, however pt unable to complete due to "shocking" pain in surgical tunnel.  Additional manual therapy completed at end of session, pt reports no pain after session.    Plan P: Attempt to grasp sponges with resistive clothespins.         Problem List Patient Active Problem List   Diagnosis Date Noted  . Arthritis of knee, left 06/16/2013  .  S/P total knee replacement using cement 06/14/2013  . Osteoarthritis of left knee 03/21/2013  . BPH (benign prostatic hyperplasia)   . GERD (gastroesophageal reflux disease)   . CAD (coronary artery disease)   . Fever 03/20/2013  . Altered mental state 03/20/2013  . Postoperative fever 03/20/2013  . PNA (pneumonia) 03/20/2013  . Preop cardiovascular exam 11/24/2012  . Peroneal tendonitis 10/05/2012  . Arthritis of foot, degenerative 10/05/2012  . HTN (hypertension) 09/25/2012  . Hyperlipidemia 09/25/2012  . Atrial fibrillation ([place]) 06/15/2012  . Long term (current) use of anticoagulants 06/15/2012  . Rotator cuff strain 02/04/2012  . Shoulder pain 02/04/2012  . Personal history of colonic polyps 01/29/2011  . High risk medication use 01/29/2011   Guadelupe Sabin,  OTR/L  [phone]  04/06/2015, 4:11 PM  [facility] [address] [place], [place], [address] Phone: [phone]   Fax:  [phone]  Name: Jerry Cain MRN: [id number] Date of Birth: Jul 07, 1931

## 2015-04-06 NOTE — Telephone Encounter (Signed)
°*  STAT* If patient is at the pharmacy, call can be transferred to refill team.   1. Which medications need to be refilled? (please list name of each medication and dose if known) Carvedilol and Rosuvastatin-pharmacist said he needs an appointment,he has appointment 05-07-15 2. Which pharmacy/location (including street and city if local pharmacy) is medication to be sent to?[phone]  3. Do they need a 30 day or 90 day supply? 4 for each medicine and refills

## 2015-04-11 ENCOUNTER — Ambulatory Visit (HOSPITAL_COMMUNITY): Payer: PPO | Attending: Orthopaedic Surgery

## 2015-04-11 ENCOUNTER — Encounter (HOSPITAL_COMMUNITY): Payer: Self-pay

## 2015-04-11 DIAGNOSIS — M6281 Muscle weakness (generalized): Secondary | ICD-10-CM | POA: Insufficient documentation

## 2015-04-11 DIAGNOSIS — M25531 Pain in right wrist: Secondary | ICD-10-CM | POA: Insufficient documentation

## 2015-04-11 DIAGNOSIS — R279 Unspecified lack of coordination: Secondary | ICD-10-CM | POA: Insufficient documentation

## 2015-04-11 DIAGNOSIS — M25631 Stiffness of right wrist, not elsewhere classified: Secondary | ICD-10-CM | POA: Diagnosis not present

## 2015-04-11 DIAGNOSIS — M25641 Stiffness of right hand, not elsewhere classified: Secondary | ICD-10-CM | POA: Diagnosis not present

## 2015-04-11 DIAGNOSIS — M629 Disorder of muscle, unspecified: Secondary | ICD-10-CM | POA: Diagnosis not present

## 2015-04-11 DIAGNOSIS — M6289 Other specified disorders of muscle: Secondary | ICD-10-CM

## 2015-04-11 NOTE — Therapy (Signed)
[place], [place], [address] Phone: [phone]   Fax:  [phone]  Occupational Therapy Treatment  Patient Details  Name: Jerry Cain MRN: [id number] Date of Birth: 1931-11-14 Referring Provider: Dr. Durward Fortes  Encounter Date: 04/11/2015      OT End of Session - 04/11/15 1039    Visit Number 4   Number of Visits 16   Date for OT Re-Evaluation 05/29/15  mini reassess on 04/27/14   Authorization Type Medicare   Authorization Time Period before the 10th visit,    Authorization - Visit Number 4   Authorization - Number of Visits 10   OT Start Time [phone]   OT Stop Time 0930   OT Time Calculation (min) 40 min   Activity Tolerance Patient tolerated treatment well   Behavior During Therapy [facility] for tasks assessed/performed      Past Medical History  Diagnosis Date  . Hypercholesterolemia     takes Atorvastatin daily  . Gout   . BPH (benign prostatic hyperplasia)     takes Proscar daily  . CAD (coronary artery disease) 2009    cardiac cath and PTCA by Dr. Tami Ribas  . Tubular adenoma 2001  . Diverticulosis 2007    tcs by Dr. Laural Golden  . GERD (gastroesophageal reflux disease)   . Adenomatous colon polyp 2001  . Osteoarthritis     left knee  . Complication of anesthesia     hard to wake up  . Hypertension     takes Imdur,Coreg,and Lisinopril daily  . Atrial fibrillation ([place])     takes Coumadin daily  . Pneumonia     many,many yrs ago  . Numbness     right hand pointer and middle finger  . Carpal tunnel syndrome     right  . Joint pain   . Joint swelling   . Nocturia   . Hypothyroidism     takes SYnthroid daily  . Anxiety     Past Surgical History  Procedure Laterality Date  . Hernia repair  2009    left inguinal  . Cataract extraction      bilateral  . Colonoscopy  2007    Dr. Laural Golden- L sided diverticulosis. Next TCS 2012 due to h/o tubular adenoma.  . Colonoscopy  02/26/2011   Procedure: COLONOSCOPY;  Surgeon: Daneil Dolin, MD;  [facility];  Service: Endoscopy;  Laterality: N/A;  11:05  . Tee without cardioversion  12/12/2011    Procedure: TRANSESOPHAGEAL ECHOCARDIOGRAM (TEE);  Surgeon: Sanda Klein, MD;  Location: [facility] ENDOSCOPY;  Service: Cardiovascular;  Laterality: N/A;   successful/sinus rhythm  . Cardioversion  12/12/2011    Procedure: CARDIOVERSION;  Surgeon: Sanda Klein, MD;  Location: [place];  Service: Cardiovascular;  Laterality: N/A;  . Bunionectomy Right 03/17/2013    Procedure: Lillard Anes, Arthroplasty 2nd toe right foot;  Surgeon: Marcheta Grammes, DPM;  Location: [facility];  Service: Orthopedics;  Laterality: Right;  . Aiken osteotomy Right 03/17/2013    Procedure: Barbie Banner OSTEOTOMY;  Surgeon: Marcheta Grammes, DPM;  [facility]: [facility];  Service: Orthopedics;  Laterality: Right;  . Flexor tenotomy Left 03/17/2013    Procedure: PERCUTANEOUS FLEXOR TENOTOMY 3RD TOE LEFT FOOT;  Surgeon: Marcheta Grammes, DPM;  [facility]: [facility];  Service: Orthopedics;  Laterality: Left;  . Cardiac catheterization  06/11/2007    PTCA X2 in 06/2007:  2 overlapping Cypher stents to LAD (2.5x13 and 3.0x23)  . Cardiac catheterization  06/21/2007  Cypher stent 2.5 x 13  to OM branch  . Coronary angioplasty      x 3   . Total knee arthroplasty Left 06/14/2013    Procedure: TOTAL KNEE ARTHROPLASTY;  Surgeon: Garald Balding, MD;  Location: [place];  Service: Orthopedics;  Laterality: Left;  . Rot Left     There were no vitals filed for this visit.  Visit Diagnosis:  Wrist stiffness, right  Stiffness of joint, hand, right  Muscle weakness  Lack of coordination  Tight fascia  Right wrist pain      Subjective Assessment - 04/11/15 0907    Subjective  S: I haven't had to wear that glove lately. The swelling has really gone down.   Currently in Pain? Yes   Pain Score 1    Pain Location Wrist   Pain Orientation Right   Pain  Descriptors / Indicators Aching   Pain Type Acute pain            [facility] OT Assessment - 04/11/15 0908    Assessment   Diagnosis S/P right carpal tunnel release   Precautions   Precautions None   Type of Shoulder Precautions n/a                  OT Treatments/Exercises (OP) - 04/11/15 0909    Exercises   Exercises Hand;Wrist;Elbow   Elbow Exercises   Forearm Supination AROM;10 reps   Forearm Pronation AROM;10 reps   Wrist Flexion AROM;10 reps   Wrist Extension AROM;10 reps   Wrist Exercises   Wrist Radial Deviation AROM;10 reps   Wrist Ulnar Deviation AROM;10 reps   Fine Motor Coordination   In Hand Manipulation Training Pt utilized coins and bank to work on finger tip to palm and palm to finger tip transition. Max difficulty due to inability to complete full fist.   Hand Exercises   MCPJ Flexion AROM;15 reps   Opposition AROM;5 reps   Digit Abduction/Adduction 10X   Other Hand Exercises Washcloth crumple 10X   Fine Motor Coordination   Fine Motor Coordination Grooved pegs   Grooved pegs Pt completed grooved pegboard successfully this date with no report of pain.    Manual Therapy   Manual Therapy Myofascial release   Manual therapy comments Manual therapy completed prior to ROM measurements and treatment exercises.   Myofascial Release Myofascial release, edema management and manual stretching completed to right hand and wrist to decrease fascial restrictions and increase joint mobility in a pain free zone.                 OT Education - 04/11/15 1027    Education provided Yes   Education Details Myriam Jacobson, PTA provided education regarding wound care. Recommended Hydrogel to moisterize incision and vasciline along edges. Remain covered.   Person(s) Educated Patient   Methods Explanation   Comprehension Verbalized understanding          OT Short Term Goals - 04/04/15 1200    OT SHORT TERM GOAL #1   Title Patient will be education and independent  with HEP for improved functional use of right hand.   Time 4   Period Weeks   Status On-going   OT SHORT TERM GOAL #2   Title Patient will increase right wrist, forearm, and hand P/ROM to [facility] to increase ability to get dressed with less difficulty.   Time 4   Period Weeks   Status On-going   OT SHORT TERM GOAL #3   Title  Patient will increase right strength to 3+/5 to increase ability to pick up and stabalize household items.   Time 4   Period Weeks   Status On-going   OT SHORT TERM GOAL #4   Title Patient will improve right grip strength by 10 pounds and pinch strength by 4# for improved ability to open jars.   Time 4   Period Weeks   Status On-going   OT SHORT TERM GOAL #5   Title Patient will improve fine motor coordination by decreasing completion time on nine hole peg test by 25% for improved ability to fasten buttons and tie his shoes.   Time 4   Period Weeks   Status On-going   OT SHORT TERM GOAL #6   Title Patient will decrease edema in his right hand by 1.0 cm for increased use of his right hand with functional tasks.    Time 4   Period Weeks   Status On-going   OT SHORT TERM GOAL #7   Title Patient will decrease pain in his right hand and wrist to 2/10 or better for improved use of his right hand with functional tasks.    Status On-going           OT Long Term Goals - 04/04/15 1200    OT LONG TERM GOAL #1   Title Patient will return to highest level of independence with all daily tasks usingRUE.   Time 8   Period Weeks   Status On-going   OT LONG TERM GOAL #2   Title Patient will increase AROM in right wrist and hand  to WNL to increase ability to complete daily tasks and manipulate household objects.    Time 8   Period Weeks   Status On-going   OT LONG TERM GOAL #3   Title Patient will increase RUE forearm and wrist strength to 5/5 to increase ability to hold grandchildren.   Time 8   Period Weeks   Status On-going   OT LONG TERM GOAL #4   Title  Patient will decrease fascial restrictions to trace amount in his right hand and wrist.   Time 8   Period Weeks   Status On-going   OT LONG TERM GOAL #5   Title Patient will decrease pain level to 1/10 in his right wrist and hand  when completing daily tasks.   Time 8   Period Weeks   Status On-going   OT LONG TERM GOAL #6   Title Patient will improve right hand grip strength to 45# or more and pinch strength to 12# or better for improved ability to grip his rifle and pull trigger.   Time 8   Period Weeks   Status On-going   OT LONG TERM GOAL #7   Title Patient will decrease edema in his right hand by 1.5 cm or more for improved functional use of his right hand.    Time 8   Period Weeks   Status On-going   OT LONG TERM GOAL #8   Title Patient will have WNL fine motor coordination in his right hand for increased independence fastening buttons.    Time 8   Period Weeks   Status On-going               Plan - 04/11/15 1121    Clinical Impression Statement A: Patient able to touch his thumb to small finger this session for the first time. Significant less amount of edema present. Edema localized in right  thenar eminence presently. Pt was able to complete grooved pegboard for the first time this session with no complaints of pain.   Plan P: Continue with grip and pinch strengthening.         Problem List Patient Active Problem List   Diagnosis Date Noted  . Arthritis of knee, left 06/16/2013  . S/P total knee replacement using cement 06/14/2013  . Osteoarthritis of left knee 03/21/2013  . BPH (benign prostatic hyperplasia)   . GERD (gastroesophageal reflux disease)   . CAD (coronary artery disease)   . Fever 03/20/2013  . Altered mental state 03/20/2013  . Postoperative fever 03/20/2013  . PNA (pneumonia) 03/20/2013  . Preop cardiovascular exam 11/24/2012  . Peroneal tendonitis 10/05/2012  . Arthritis of foot, degenerative 10/05/2012  . HTN (hypertension)  09/25/2012  . Hyperlipidemia 09/25/2012  . Atrial fibrillation ([place]) 06/15/2012  . Long term (current) use of anticoagulants 06/15/2012  . Rotator cuff strain 02/04/2012  . Shoulder pain 02/04/2012  . Personal history of colonic polyps 01/29/2011  . High risk medication use 01/29/2011    Ailene Ravel, OTR/L,CBIS  [phone]  04/11/2015, 11:25 AM  [place] [address] [place], [place], [address] Phone: [phone]   Fax:  [phone]  Name: Jerry Cain MRN: [id number] Date of Birth: [birth year]

## 2015-04-13 ENCOUNTER — Encounter (HOSPITAL_COMMUNITY): Payer: Self-pay | Admitting: Occupational Therapy

## 2015-04-13 ENCOUNTER — Ambulatory Visit (HOSPITAL_COMMUNITY): Payer: PPO | Admitting: Occupational Therapy

## 2015-04-13 DIAGNOSIS — M25531 Pain in right wrist: Secondary | ICD-10-CM

## 2015-04-13 DIAGNOSIS — M25631 Stiffness of right wrist, not elsewhere classified: Secondary | ICD-10-CM | POA: Diagnosis not present

## 2015-04-13 DIAGNOSIS — R279 Unspecified lack of coordination: Secondary | ICD-10-CM

## 2015-04-13 DIAGNOSIS — M25641 Stiffness of right hand, not elsewhere classified: Secondary | ICD-10-CM

## 2015-04-13 DIAGNOSIS — M629 Disorder of muscle, unspecified: Secondary | ICD-10-CM

## 2015-04-13 DIAGNOSIS — M6281 Muscle weakness (generalized): Secondary | ICD-10-CM

## 2015-04-13 DIAGNOSIS — M6289 Other specified disorders of muscle: Secondary | ICD-10-CM

## 2015-04-13 NOTE — Therapy (Signed)
[place], [place], [address] Phone: [phone]   Fax:  [phone]  Occupational Therapy Treatment  Patient Details  Name: Jerry Cain MRN: [id number] Date of Birth: 27-Jun-1931 Referring Provider: Dr. Durward Fortes  Encounter Date: 04/13/2015      OT End of Session - 04/13/15 1200    Visit Number 5   Number of Visits 16   Date for OT Re-Evaluation 05/29/15  mini reassess on 04/27/14   Authorization Type Medicare   Authorization Time Period before the 10th visit,    Authorization - Visit Number 5   Authorization - Number of Visits 10   OT Start Time 1102   OT Stop Time 1145   OT Time Calculation (min) 43 min   Activity Tolerance Patient tolerated treatment well   Behavior During Therapy [facility] for tasks assessed/performed      Past Medical History  Diagnosis Date  . Hypercholesterolemia     takes Atorvastatin daily  . Gout   . BPH (benign prostatic hyperplasia)     takes Proscar daily  . CAD (coronary artery disease) 2009    cardiac cath and PTCA by Dr. Tami Ribas  . Tubular adenoma 2001  . Diverticulosis 2007    tcs by Dr. Laural Golden  . GERD (gastroesophageal reflux disease)   . Adenomatous colon polyp 2001  . Osteoarthritis     left knee  . Complication of anesthesia     hard to wake up  . Hypertension     takes Imdur,Coreg,and Lisinopril daily  . Atrial fibrillation ([place])     takes Coumadin daily  . Pneumonia     many,many yrs ago  . Numbness     right hand pointer and middle finger  . Carpal tunnel syndrome     right  . Joint pain   . Joint swelling   . Nocturia   . Hypothyroidism     takes SYnthroid daily  . Anxiety     Past Surgical History  Procedure Laterality Date  . Hernia repair  2009    left inguinal  . Cataract extraction      bilateral  . Colonoscopy  2007    Dr. Laural Golden- L sided diverticulosis. Next TCS 2012 due to h/o tubular adenoma.  . Colonoscopy  02/26/2011     Procedure: COLONOSCOPY;  Surgeon: Daneil Dolin, MD;  [facility];  Service: Endoscopy;  Laterality: N/A;  11:05  . Tee without cardioversion  12/12/2011    Procedure: TRANSESOPHAGEAL ECHOCARDIOGRAM (TEE);  Surgeon: Sanda Klein, MD;  Location: [facility] ENDOSCOPY;  Service: Cardiovascular;  Laterality: N/A;   successful/sinus rhythm  . Cardioversion  12/12/2011    Procedure: CARDIOVERSION;  Surgeon: Sanda Klein, MD;  Location: [place];  Service: Cardiovascular;  Laterality: N/A;  . Bunionectomy Right 03/17/2013    Procedure: Lillard Anes, Arthroplasty 2nd toe right foot;  Surgeon: Marcheta Grammes, DPM;  [facility];  Service: Orthopedics;  Laterality: Right;  . Aiken osteotomy Right 03/17/2013    Procedure: Barbie Banner OSTEOTOMY;  Surgeon: Marcheta Grammes, DPM;  [facility];  Service: Orthopedics;  Laterality: Right;  . Flexor tenotomy Left 03/17/2013    Procedure: PERCUTANEOUS FLEXOR TENOTOMY 3RD TOE LEFT FOOT;  Surgeon: Marcheta Grammes, DPM;  Location: [facility];  Service: Orthopedics;  Laterality: Left;  . Cardiac catheterization  06/11/2007    PTCA X2 in 06/2007:  2 overlapping Cypher stents to LAD (2.5x13 and 3.0x23)  . Cardiac catheterization  06/21/2007    Cypher stent 2.5 x 13  to OM branch  . Coronary angioplasty      x 3   . Total knee arthroplasty Left 06/14/2013    Procedure: TOTAL KNEE ARTHROPLASTY;  Surgeon: Garald Balding, MD;  Location: [place];  Service: Orthopedics;  Laterality: Left;  . Rot Left     There were no vitals filed for this visit.  Visit Diagnosis:  Wrist stiffness, right  Stiffness of joint, hand, right  Muscle weakness  Lack of coordination  Tight fascia  Right wrist pain      Subjective Assessment - 04/13/15 1103    Subjective  S: My wrist is in worse shape today than yesterday. I lifted my grandaughter up a lot yesterday.    Currently in Pain? Yes   Pain Score 2    Pain Location Wrist   Pain Orientation  Right   Pain Descriptors / Indicators Aching   Pain Type Acute pain            [facility] OT Assessment - 04/13/15 1158    Assessment   Diagnosis S/P right carpal tunnel release   Precautions   Precautions None   Type of Shoulder Precautions n/a                  OT Treatments/Exercises (OP) - 04/13/15 1105    Exercises   Exercises Hand;Wrist;Elbow   Elbow Exercises   Forearm Supination AROM;15 reps   Forearm Pronation AROM;15 reps   Wrist Flexion AROM;15 reps   Wrist Extension AROM;15 reps   Wrist Exercises   Wrist Radial Deviation AROM;15 reps   Wrist Ulnar Deviation AROM;15 reps   Additional Wrist Exercises   Sponges 9, 9   Hand Exercises   MCPJ Flexion AROM;15 reps   Opposition AROM;10 reps   Digit Abduction/Adduction 15X   Other Hand Exercises Washcloth crumple 10X   Fine Motor Coordination   Other Fine Motor Exercises Pt completed resistive clothespin task with green clothespin, using right hand with lateral and 3 pt pinch to grasp and place 25 high resistance sponges into bucket. Mod difficulty with 3 pt pinch, min with lateral pinch.    Manual Therapy   Manual Therapy Myofascial release   Manual therapy comments Manual therapy completed prior to ROM measurements and treatment exercises.   Myofascial Release Myofascial release, edema management and manual stretching completed to right hand and wrist to decrease fascial restrictions and increase joint mobility in a pain free zone.                   OT Short Term Goals - 04/04/15 1200    OT SHORT TERM GOAL #1   Title Patient will be education and independent with HEP for improved functional use of right hand.   Time 4   Period Weeks   Status On-going   OT SHORT TERM GOAL #2   Title Patient will increase right wrist, forearm, and hand P/ROM to [facility] to increase ability to get dressed with less difficulty.   Time 4   Period Weeks   Status On-going   OT SHORT TERM GOAL #3   Title Patient will  increase right strength to 3+/5 to increase ability to pick up and stabalize household items.   Time 4   Period Weeks   Status On-going   OT SHORT TERM GOAL #4   Title Patient will improve right grip strength by 10 pounds and pinch strength by 4# for  improved ability to open jars.   Time 4   Period Weeks   Status On-going   OT SHORT TERM GOAL #5   Title Patient will improve fine motor coordination by decreasing completion time on nine hole peg test by 25% for improved ability to fasten buttons and tie his shoes.   Time 4   Period Weeks   Status On-going   OT SHORT TERM GOAL #6   Title Patient will decrease edema in his right hand by 1.0 cm for increased use of his right hand with functional tasks.    Time 4   Period Weeks   Status On-going   OT SHORT TERM GOAL #7   Title Patient will decrease pain in his right hand and wrist to 2/10 or better for improved use of his right hand with functional tasks.    Status On-going           OT Long Term Goals - 04/04/15 1200    OT LONG TERM GOAL #1   Title Patient will return to highest level of independence with all daily tasks usingRUE.   Time 8   Period Weeks   Status On-going   OT LONG TERM GOAL #2   Title Patient will increase AROM in right wrist and hand  to WNL to increase ability to complete daily tasks and manipulate household objects.    Time 8   Period Weeks   Status On-going   OT LONG TERM GOAL #3   Title Patient will increase RUE forearm and wrist strength to 5/5 to increase ability to hold grandchildren.   Time 8   Period Weeks   Status On-going   OT LONG TERM GOAL #4   Title Patient will decrease fascial restrictions to trace amount in his right hand and wrist.   Time 8   Period Weeks   Status On-going   OT LONG TERM GOAL #5   Title Patient will decrease pain level to 1/10 in his right wrist and hand  when completing daily tasks.   Time 8   Period Weeks   Status On-going   OT LONG TERM GOAL #6   Title Patient  will improve right hand grip strength to 45# or more and pinch strength to 12# or better for improved ability to grip his rifle and pull trigger.   Time 8   Period Weeks   Status On-going   OT LONG TERM GOAL #7   Title Patient will decrease edema in his right hand by 1.5 cm or more for improved functional use of his right hand.    Time 8   Period Weeks   Status On-going   OT LONG TERM GOAL #8   Title Patient will have WNL fine motor coordination in his right hand for increased independence fastening buttons.    Time 8   Period Weeks   Status On-going               Plan - 04/13/15 1201    Clinical Impression Statement A: Pt completed A/ROM exericses this session, added pinch strengthening exercises with lateral and 3 pt pinch. Pt reports moderate fatigue after 3 pt pinch task.    Plan P: Update HEP for current wrist A/ROM exercises; fine motor coordination activity        Problem List Patient Active Problem List   Diagnosis Date Noted  . Arthritis of knee, left 06/16/2013  . S/P total knee replacement using cement 06/14/2013  . Osteoarthritis of  left knee 03/21/2013  . BPH (benign prostatic hyperplasia)   . GERD (gastroesophageal reflux disease)   . CAD (coronary artery disease)   . Fever 03/20/2013  . Altered mental state 03/20/2013  . Postoperative fever 03/20/2013  . PNA (pneumonia) 03/20/2013  . Preop cardiovascular exam 11/24/2012  . Peroneal tendonitis 10/05/2012  . Arthritis of foot, degenerative 10/05/2012  . HTN (hypertension) 09/25/2012  . Hyperlipidemia 09/25/2012  . Atrial fibrillation ([place]) 06/15/2012  . Long term (current) use of anticoagulants 06/15/2012  . Rotator cuff strain 02/04/2012  . Shoulder pain 02/04/2012  . Personal history of colonic polyps 01/29/2011  . High risk medication use 01/29/2011    Guadelupe Sabin, OTR/L  [phone]  04/13/2015, 12:04 PM  [place], [place], [address] Phone: [phone]   Fax:  [phone]  Name: Jerry Cain MRN: [id number] Date of Birth: October 08, 1931

## 2015-04-18 ENCOUNTER — Ambulatory Visit (HOSPITAL_COMMUNITY): Payer: PPO | Admitting: Specialist

## 2015-04-18 DIAGNOSIS — M25631 Stiffness of right wrist, not elsewhere classified: Secondary | ICD-10-CM

## 2015-04-18 DIAGNOSIS — M25641 Stiffness of right hand, not elsewhere classified: Secondary | ICD-10-CM

## 2015-04-18 DIAGNOSIS — R279 Unspecified lack of coordination: Secondary | ICD-10-CM

## 2015-04-18 DIAGNOSIS — M6281 Muscle weakness (generalized): Secondary | ICD-10-CM

## 2015-04-18 NOTE — Patient Instructions (Signed)
Home Exercises Program Theraputty Exercises  Do the following exercises 3 times a day using your affected hand.  1. Roll putty into a ball.  2. Make into a pancake.  3. Roll putty into a roll.  4. Pinch along log with first finger and thumb.   5. Make into a ball.  6. Roll it back into a log.   7. Pinch using thumb and side of first finger.  8. Roll into a ball, then flatten into a pancake.  9. Using your fingers, make putty into a mountain.

## 2015-04-18 NOTE — Therapy (Signed)
[place], [place], [address] Phone: [phone]   Fax:  [phone]  Occupational Therapy Treatment  Patient Details  Name: Jerry Cain MRN: [id number] Date of Birth: 1931/12/23 Referring Provider: Dr. Durward Fortes  Encounter Date: 04/18/2015      OT End of Session - 04/18/15 1000    Visit Number 6   Number of Visits 16   Date for OT Re-Evaluation 05/29/15  mini reassess on 04/28/15   Authorization Type Medicare   Authorization Time Period before the 10th visit,    Authorization - Visit Number 6   Authorization - Number of Visits 10   OT Start Time [phone]   OT Stop Time 0940   OT Time Calculation (min) 49 min   Activity Tolerance Patient tolerated treatment well   Behavior During Therapy [facility] for tasks assessed/performed      Past Medical History  Diagnosis Date  . Hypercholesterolemia     takes Atorvastatin daily  . Gout   . BPH (benign prostatic hyperplasia)     takes Proscar daily  . CAD (coronary artery disease) 2009    cardiac cath and PTCA by Dr. Tami Ribas  . Tubular adenoma 2001  . Diverticulosis 2007    tcs by Dr. Laural Golden  . GERD (gastroesophageal reflux disease)   . Adenomatous colon polyp 2001  . Osteoarthritis     left knee  . Complication of anesthesia     hard to wake up  . Hypertension     takes Imdur,Coreg,and Lisinopril daily  . Atrial fibrillation ([place])     takes Coumadin daily  . Pneumonia     many,many yrs ago  . Numbness     right hand pointer and middle finger  . Carpal tunnel syndrome     right  . Joint pain   . Joint swelling   . Nocturia   . Hypothyroidism     takes SYnthroid daily  . Anxiety     Past Surgical History  Procedure Laterality Date  . Hernia repair  2009    left inguinal  . Cataract extraction      bilateral  . Colonoscopy  2007    Dr. Laural Golden- L sided diverticulosis. Next TCS 2012 due to h/o tubular adenoma.  . Colonoscopy  02/26/2011   Procedure: COLONOSCOPY;  Surgeon: Daneil Dolin, MD;  [facility];  Service: Endoscopy;  Laterality: N/A;  11:05  . Tee without cardioversion  12/12/2011    Procedure: TRANSESOPHAGEAL ECHOCARDIOGRAM (TEE);  Surgeon: Sanda Klein, MD;  Location: [facility] ENDOSCOPY;  Service: Cardiovascular;  Laterality: N/A;   successful/sinus rhythm  . Cardioversion  12/12/2011    Procedure: CARDIOVERSION;  Surgeon: Sanda Klein, MD;  Location: [place];  Service: Cardiovascular;  Laterality: N/A;  . Bunionectomy Right 03/17/2013    Procedure: Lillard Anes, Arthroplasty 2nd toe right foot;  Surgeon: Marcheta Grammes, DPM;  Location: [facility];  Service: Orthopedics;  Laterality: Right;  . Aiken osteotomy Right 03/17/2013    Procedure: Barbie Banner OSTEOTOMY;  Surgeon: Marcheta Grammes, DPM;  [facility]: [facility];  Service: Orthopedics;  Laterality: Right;  . Flexor tenotomy Left 03/17/2013    Procedure: PERCUTANEOUS FLEXOR TENOTOMY 3RD TOE LEFT FOOT;  Surgeon: Marcheta Grammes, DPM;  [facility]: [facility];  Service: Orthopedics;  Laterality: Left;  . Cardiac catheterization  06/11/2007    PTCA X2 in 06/2007:  2 overlapping Cypher stents to LAD (2.5x13 and 3.0x23)  . Cardiac catheterization  06/21/2007  Cypher stent 2.5 x 13  to OM branch  . Coronary angioplasty      x 3   . Total knee arthroplasty Left 06/14/2013    Procedure: TOTAL KNEE ARTHROPLASTY;  Surgeon: Garald Balding, MD;  Location: [place];  Service: Orthopedics;  Laterality: Left;  . Rot Left     There were no vitals filed for this visit.  Visit Diagnosis:  Wrist stiffness, right  Stiffness of joint, hand, right  Muscle weakness  Lack of coordination          [facility] OT Assessment - 04/18/15 0001    Assessment   Diagnosis S/P right carpal tunnel release   Precautions   Precautions None   Type of Shoulder Precautions n/a                  OT Treatments/Exercises (OP) - 04/18/15 0001    Exercises   Exercises  Hand;Wrist;Elbow   Elbow Exercises   Forearm Supination AROM;15 reps   Forearm Pronation AROM;15 reps   Wrist Flexion AROM;15 reps   Wrist Extension AROM;15 reps   Weighted Stretch Over Towel Roll   Supination - Weighted Stretch 2 pounds;60 seconds   Wrist Flexion - Weighted Stretch 2 pounds;60 seconds   Wrist Extension - Weighted Stretch 2 pounds;60 seconds   Wrist Exercises   Wrist Radial Deviation AROM;15 reps   Wrist Ulnar Deviation AROM;15 reps   Additional Wrist Exercises   Theraputty Flatten;Roll;Grip;Locate Pegs  red putty, flatten in standing, grip sup/pro   Manual Therapy   Manual Therapy Myofascial release   Manual therapy comments Manual therapy completed prior to ROM measurements and treatment exercises.   Myofascial Release Myofascial release, edema management and manual stretching completed to right hand and wrist to decrease fascial restrictions and increase joint mobility in a pain free zone.                 OT Education - 04/18/15 0959    Education provided Yes   Education Details theraputty for wrist and hand A/ROM and strengthening   Person(s) Educated Patient   Methods Explanation;Demonstration   Comprehension Verbalized understanding;Returned demonstration          OT Short Term Goals - 04/04/15 1200    OT SHORT TERM GOAL #1   Title Patient will be education and independent with HEP for improved functional use of right hand.   Time 4   Period Weeks   Status On-going   OT SHORT TERM GOAL #2   Title Patient will increase right wrist, forearm, and hand P/ROM to [facility] to increase ability to get dressed with less difficulty.   Time 4   Period Weeks   Status On-going   OT SHORT TERM GOAL #3   Title Patient will increase right strength to 3+/5 to increase ability to pick up and stabalize household items.   Time 4   Period Weeks   Status On-going   OT SHORT TERM GOAL #4   Title Patient will improve right grip strength by 10 pounds and pinch  strength by 4# for improved ability to open jars.   Time 4   Period Weeks   Status On-going   OT SHORT TERM GOAL #5   Title Patient will improve fine motor coordination by decreasing completion time on nine hole peg test by 25% for improved ability to fasten buttons and tie his shoes.   Time 4   Period Weeks   Status On-going   OT SHORT  TERM GOAL #6   Title Patient will decrease edema in his right hand by 1.0 cm for increased use of his right hand with functional tasks.    Time 4   Period Weeks   Status On-going   OT SHORT TERM GOAL #7   Title Patient will decrease pain in his right hand and wrist to 2/10 or better for improved use of his right hand with functional tasks.    Status On-going           OT Long Term Goals - 04/04/15 1200    OT LONG TERM GOAL #1   Title Patient will return to highest level of independence with all daily tasks usingRUE.   Time 8   Period Weeks   Status On-going   OT LONG TERM GOAL #2   Title Patient will increase AROM in right wrist and hand  to WNL to increase ability to complete daily tasks and manipulate household objects.    Time 8   Period Weeks   Status On-going   OT LONG TERM GOAL #3   Title Patient will increase RUE forearm and wrist strength to 5/5 to increase ability to hold grandchildren.   Time 8   Period Weeks   Status On-going   OT LONG TERM GOAL #4   Title Patient will decrease fascial restrictions to trace amount in his right hand and wrist.   Time 8   Period Weeks   Status On-going   OT LONG TERM GOAL #5   Title Patient will decrease pain level to 1/10 in his right wrist and hand  when completing daily tasks.   Time 8   Period Weeks   Status On-going   OT LONG TERM GOAL #6   Title Patient will improve right hand grip strength to 45# or more and pinch strength to 12# or better for improved ability to grip his rifle and pull trigger.   Time 8   Period Weeks   Status On-going   OT LONG TERM GOAL #7   Title Patient will  decrease edema in his right hand by 1.5 cm or more for improved functional use of his right hand.    Time 8   Period Weeks   Status On-going   OT LONG TERM GOAL #8   Title Patient will have WNL fine motor coordination in his right hand for increased independence fastening buttons.    Time 8   Period Weeks   Status On-going               Plan - 04/18/15 1000    Clinical Impression Statement A:  Added weighted wrist stretch this date.  Patient felt release with flexion stretch which allowed for greater flexion and extension of wrist.  Patient had significant decrase in edema after MFR and edema massage this date.  Added red theraputty for wrist extension and grip strengthening this date.    Plan P:  improve ability to complete flattening activity with theraputty increase stretch duration with weight, add fine motor task.         Problem List Patient Active Problem List   Diagnosis Date Noted  . Arthritis of knee, left 06/16/2013  . S/P total knee replacement using cement 06/14/2013  . Osteoarthritis of left knee 03/21/2013  . BPH (benign prostatic hyperplasia)   . GERD (gastroesophageal reflux disease)   . CAD (coronary artery disease)   . Fever 03/20/2013  . Altered mental state 03/20/2013  . Postoperative fever  03/20/2013  . PNA (pneumonia) 03/20/2013  . Preop cardiovascular exam 11/24/2012  . Peroneal tendonitis 10/05/2012  . Arthritis of foot, degenerative 10/05/2012  . HTN (hypertension) 09/25/2012  . Hyperlipidemia 09/25/2012  . Atrial fibrillation ([place]) 06/15/2012  . Long term (current) use of anticoagulants 06/15/2012  . Rotator cuff strain 02/04/2012  . Shoulder pain 02/04/2012  . Personal history of colonic polyps 01/29/2011  . High risk medication use 01/29/2011    Vangie Bicker, OTR/L [phone]  04/18/2015, 10:03 AM  [place] [address] [place], [place], [address] Phone: [phone]   Fax:   [phone]  Name: Jerry Cain MRN: [id number] Date of Birth: 11-22-1931

## 2015-04-19 ENCOUNTER — Encounter (HOSPITAL_COMMUNITY): Payer: Medicare Other | Admitting: Specialist

## 2015-04-19 DIAGNOSIS — I10 Essential (primary) hypertension: Secondary | ICD-10-CM | POA: Diagnosis not present

## 2015-04-19 DIAGNOSIS — E785 Hyperlipidemia, unspecified: Secondary | ICD-10-CM | POA: Diagnosis not present

## 2015-04-19 DIAGNOSIS — Z79899 Other long term (current) drug therapy: Secondary | ICD-10-CM | POA: Diagnosis not present

## 2015-04-19 DIAGNOSIS — E039 Hypothyroidism, unspecified: Secondary | ICD-10-CM | POA: Diagnosis not present

## 2015-04-20 ENCOUNTER — Encounter (HOSPITAL_COMMUNITY): Payer: Medicare Other | Admitting: Specialist

## 2015-04-20 ENCOUNTER — Ambulatory Visit (INDEPENDENT_AMBULATORY_CARE_PROVIDER_SITE_OTHER): Payer: PPO | Admitting: Urology

## 2015-04-20 DIAGNOSIS — R351 Nocturia: Secondary | ICD-10-CM

## 2015-04-20 DIAGNOSIS — N138 Other obstructive and reflux uropathy: Secondary | ICD-10-CM | POA: Diagnosis not present

## 2015-04-20 DIAGNOSIS — N401 Enlarged prostate with lower urinary tract symptoms: Secondary | ICD-10-CM | POA: Diagnosis not present

## 2015-04-21 ENCOUNTER — Other Ambulatory Visit: Payer: Self-pay | Admitting: Cardiovascular Disease

## 2015-04-21 ENCOUNTER — Other Ambulatory Visit: Payer: Self-pay | Admitting: Internal Medicine

## 2015-04-23 ENCOUNTER — Ambulatory Visit (HOSPITAL_COMMUNITY): Payer: PPO | Admitting: Specialist

## 2015-04-23 DIAGNOSIS — M25641 Stiffness of right hand, not elsewhere classified: Secondary | ICD-10-CM

## 2015-04-23 DIAGNOSIS — M6281 Muscle weakness (generalized): Secondary | ICD-10-CM

## 2015-04-23 DIAGNOSIS — M25631 Stiffness of right wrist, not elsewhere classified: Secondary | ICD-10-CM | POA: Diagnosis not present

## 2015-04-23 DIAGNOSIS — R279 Unspecified lack of coordination: Secondary | ICD-10-CM

## 2015-04-23 NOTE — Therapy (Signed)
[place], [place], [address] Phone: [phone]   Fax:  [phone]  Occupational Therapy Treatment  Patient Details  Name: Jerry Cain MRN: [id number] Date of Birth: [birth year] Referring Provider: Dr. Durward Fortes  Encounter Date: 04/23/2015      OT End of Session - 04/23/15 1204    Visit Number 7   Number of Visits 16   Date for OT Re-Evaluation 05/29/15  mini reassess on 04/28/15   Authorization Type Medicare   Authorization Time Period before the 10th visit,    Authorization - Visit Number 7   Authorization - Number of Visits 10   OT Start Time 1020   OT Stop Time 1102   OT Time Calculation (min) 42 min   Activity Tolerance Patient tolerated treatment well   Behavior During Therapy [facility] for tasks assessed/performed      Past Medical History  Diagnosis Date  . Hypercholesterolemia     takes Atorvastatin daily  . Gout   . BPH (benign prostatic hyperplasia)     takes Proscar daily  . CAD (coronary artery disease) 2009    cardiac cath and PTCA by Dr. Tami Ribas  . Tubular adenoma 2001  . Diverticulosis 2007    tcs by Dr. Laural Golden  . GERD (gastroesophageal reflux disease)   . Adenomatous colon polyp 2001  . Osteoarthritis     left knee  . Complication of anesthesia     hard to wake up  . Hypertension     takes Imdur,Coreg,and Lisinopril daily  . Atrial fibrillation ([place])     takes Coumadin daily  . Pneumonia     many,many yrs ago  . Numbness     right hand pointer and middle finger  . Carpal tunnel syndrome     right  . Joint pain   . Joint swelling   . Nocturia   . Hypothyroidism     takes SYnthroid daily  . Anxiety     Past Surgical History  Procedure Laterality Date  . Hernia repair  2009    left inguinal  . Cataract extraction      bilateral  . Colonoscopy  2007    Dr. Laural Golden- L sided diverticulosis. Next TCS 2012 due to h/o tubular adenoma.  . Colonoscopy  02/26/2011   Procedure: COLONOSCOPY;  Surgeon: Daneil Dolin, MD;  [facility];  Service: Endoscopy;  Laterality: N/A;  11:05  . Tee without cardioversion  12/12/2011    Procedure: TRANSESOPHAGEAL ECHOCARDIOGRAM (TEE);  Surgeon: Sanda Klein, MD;  Location: [facility] ENDOSCOPY;  Service: Cardiovascular;  Laterality: N/A;   successful/sinus rhythm  . Cardioversion  12/12/2011    Procedure: CARDIOVERSION;  Surgeon: Sanda Klein, MD;  Location: [place];  Service: Cardiovascular;  Laterality: N/A;  . Bunionectomy Right 03/17/2013    Procedure: Lillard Anes, Arthroplasty 2nd toe right foot;  Surgeon: Marcheta Grammes, DPM;  [facility];  Service: Orthopedics;  Laterality: Right;  . Aiken osteotomy Right 03/17/2013    Procedure: Barbie Banner OSTEOTOMY;  Surgeon: Marcheta Grammes, DPM;  [facility];  Service: Orthopedics;  Laterality: Right;  . Flexor tenotomy Left 03/17/2013    Procedure: PERCUTANEOUS FLEXOR TENOTOMY 3RD TOE LEFT FOOT;  Surgeon: Marcheta Grammes, DPM;  [facility];  Service: Orthopedics;  Laterality: Left;  . Cardiac catheterization  06/11/2007    PTCA X2 in 06/2007:  2 overlapping Cypher stents to LAD (2.5x13 and 3.0x23)  . Cardiac catheterization  06/21/2007  Cypher stent 2.5 x 13  to OM branch  . Coronary angioplasty      x 3   . Total knee arthroplasty Left 06/14/2013    Procedure: TOTAL KNEE ARTHROPLASTY;  Surgeon: Garald Balding, MD;  Location: [place];  Service: Orthopedics;  Laterality: Left;  . Rot Left     There were no vitals filed for this visit.  Visit Diagnosis:  Wrist stiffness, right  Stiffness of joint, hand, right  Muscle weakness  Lack of coordination          [facility] OT Assessment - 04/23/15 0001    Assessment   Diagnosis S/P right carpal tunnel release   Precautions   Precautions None                  OT Treatments/Exercises (OP) - 04/23/15 0001    Exercises   Exercises Hand;Wrist;Elbow   Elbow Exercises    Forearm Supination AROM;15 reps   Forearm Pronation AROM;15 reps   Wrist Flexion AROM;15 reps   Wrist Extension AROM;15 reps   Additional Elbow Exercises   Theraputty Flatten;Roll;Grip;Locate Pegs   Theraputty - Flatten red in standing   Theraputty - Roll red  locate 10 beads with minimal difficulty   Theraputty - Grip supinated and pronated grip   Weighted Stretch Over Towel Roll   Supination - Weighted Stretch 2 pounds;2 minutes   Wrist Flexion - Weighted Stretch 2 pounds;2 minutes   Wrist Extension - Weighted Stretch 2 pounds;2 minutes   Wrist Exercises   Wrist Radial Deviation AROM;15 reps   Wrist Ulnar Deviation AROM;15 reps   Hand Exercises   Opposition AROM;10 reps  able to oppose to small finger for the first time today   Fine Motor Coordination   Tendon Glides 10 X   Manual Therapy   Manual Therapy Myofascial release   Manual therapy comments Manual therapy completed prior to ROM measurements and treatment exercises.   Myofascial Release Myofascial release, edema management and manual stretching completed to right hand and wrist to decrease fascial restrictions and increase joint mobility in a pain free zone.                   OT Short Term Goals - 04/04/15 1200    OT SHORT TERM GOAL #1   Title Patient will be education and independent with HEP for improved functional use of right hand.   Time 4   Period Weeks   Status On-going   OT SHORT TERM GOAL #2   Title Patient will increase right wrist, forearm, and hand P/ROM to [facility] to increase ability to get dressed with less difficulty.   Time 4   Period Weeks   Status On-going   OT SHORT TERM GOAL #3   Title Patient will increase right strength to 3+/5 to increase ability to pick up and stabalize household items.   Time 4   Period Weeks   Status On-going   OT SHORT TERM GOAL #4   Title Patient will improve right grip strength by 10 pounds and pinch strength by 4# for improved ability to open jars.   Time  4   Period Weeks   Status On-going   OT SHORT TERM GOAL #5   Title Patient will improve fine motor coordination by decreasing completion time on nine hole peg test by 25% for improved ability to fasten buttons and tie his shoes.   Time 4   Period Weeks   Status On-going  OT SHORT TERM GOAL #6   Title Patient will decrease edema in his right hand by 1.0 cm for increased use of his right hand with functional tasks.    Time 4   Period Weeks   Status On-going   OT SHORT TERM GOAL #7   Title Patient will decrease pain in his right hand and wrist to 2/10 or better for improved use of his right hand with functional tasks.    Status On-going           OT Long Term Goals - 04/04/15 1200    OT LONG TERM GOAL #1   Title Patient will return to highest level of independence with all daily tasks usingRUE.   Time 8   Period Weeks   Status On-going   OT LONG TERM GOAL #2   Title Patient will increase AROM in right wrist and hand  to WNL to increase ability to complete daily tasks and manipulate household objects.    Time 8   Period Weeks   Status On-going   OT LONG TERM GOAL #3   Title Patient will increase RUE forearm and wrist strength to 5/5 to increase ability to hold grandchildren.   Time 8   Period Weeks   Status On-going   OT LONG TERM GOAL #4   Title Patient will decrease fascial restrictions to trace amount in his right hand and wrist.   Time 8   Period Weeks   Status On-going   OT LONG TERM GOAL #5   Title Patient will decrease pain level to 1/10 in his right wrist and hand  when completing daily tasks.   Time 8   Period Weeks   Status On-going   OT LONG TERM GOAL #6   Title Patient will improve right hand grip strength to 45# or more and pinch strength to 12# or better for improved ability to grip his rifle and pull trigger.   Time 8   Period Weeks   Status On-going   OT LONG TERM GOAL #7   Title Patient will decrease edema in his right hand by 1.5 cm or more for  improved functional use of his right hand.    Time 8   Period Weeks   Status On-going   OT LONG TERM GOAL #8   Title Patient will have WNL fine motor coordination in his right hand for increased independence fastening buttons.    Time 8   Period Weeks   Status On-going               Plan - 04/23/15 1207    Clinical Impression Statement A:  increased to 2' with weighted wrist stretch this date to improve functional wrist use.  Patient able to oppose to small digit for the first time this date.     Plan P:  add fine motor task, add sponges activity for improved grasp and improved in hand translation.         Problem List Patient Active Problem List   Diagnosis Date Noted  . Arthritis of knee, left 06/16/2013  . S/P total knee replacement using cement 06/14/2013  . Osteoarthritis of left knee 03/21/2013  . BPH (benign prostatic hyperplasia)   . GERD (gastroesophageal reflux disease)   . CAD (coronary artery disease)   . Fever 03/20/2013  . Altered mental state 03/20/2013  . Postoperative fever 03/20/2013  . PNA (pneumonia) 03/20/2013  . Preop cardiovascular exam 11/24/2012  . Peroneal tendonitis 10/05/2012  .  Arthritis of foot, degenerative 10/05/2012  . HTN (hypertension) 09/25/2012  . Hyperlipidemia 09/25/2012  . Atrial fibrillation ([place]) 06/15/2012  . Long term (current) use of anticoagulants 06/15/2012  . Rotator cuff strain 02/04/2012  . Shoulder pain 02/04/2012  . Personal history of colonic polyps 01/29/2011  . High risk medication use 01/29/2011    Vangie Bicker, OTR/L [phone]  04/23/2015, 12:12 PM  [place] [address] [place], [place], [address] Phone: [phone]   Fax:  [phone]  Name: Jerry Cain MRN: [id number] Date of Birth: 01-Jul-1931

## 2015-04-23 NOTE — Telephone Encounter (Signed)
Rx(s) sent to pharmacy electronically.  

## 2015-04-25 ENCOUNTER — Encounter (HOSPITAL_COMMUNITY): Payer: Medicare Other | Admitting: Specialist

## 2015-04-27 ENCOUNTER — Ambulatory Visit (HOSPITAL_COMMUNITY): Payer: PPO

## 2015-04-27 ENCOUNTER — Encounter (HOSPITAL_COMMUNITY): Payer: Self-pay

## 2015-04-27 DIAGNOSIS — M25631 Stiffness of right wrist, not elsewhere classified: Secondary | ICD-10-CM | POA: Diagnosis not present

## 2015-04-27 DIAGNOSIS — M25641 Stiffness of right hand, not elsewhere classified: Secondary | ICD-10-CM

## 2015-04-27 DIAGNOSIS — M6281 Muscle weakness (generalized): Secondary | ICD-10-CM

## 2015-04-27 DIAGNOSIS — R279 Unspecified lack of coordination: Secondary | ICD-10-CM

## 2015-04-27 NOTE — Therapy (Signed)
[facility] [address] [place], [place], [address] Phone: [phone]   Fax:  [phone]  Occupational Therapy Treatment and mini reassessment  Patient Details  Name: Jerry Cain MRN: [id number] Date of Birth: April 30, 1931 Referring Provider: Dr. Durward Fortes  Encounter Date: 04/27/2015      OT End of Session - 04/27/15 1049    Visit Number 8   Number of Visits 16   Date for OT Re-Evaluation 05/29/15   Authorization Type Medicare   Authorization Time Period before the 10th visit,    Authorization - Visit Number 8   Authorization - Number of Visits 10   OT Start Time 0845  mini reassessment   OT Stop Time 0932   OT Time Calculation (min) 47 min   Activity Tolerance Patient tolerated treatment well   Behavior During Therapy [facility] for tasks assessed/performed      Past Medical History  Diagnosis Date  . Hypercholesterolemia     takes Atorvastatin daily  . Gout   . BPH (benign prostatic hyperplasia)     takes Proscar daily  . CAD (coronary artery disease) 2009    cardiac cath and PTCA by Dr. Tami Ribas  . Tubular adenoma 2001  . Diverticulosis 2007    tcs by Dr. Laural Golden  . GERD (gastroesophageal reflux disease)   . Adenomatous colon polyp 2001  . Osteoarthritis     left knee  . Complication of anesthesia     hard to wake up  . Hypertension     takes Imdur,Coreg,and Lisinopril daily  . Atrial fibrillation ([place])     takes Coumadin daily  . Pneumonia     many,many yrs ago  . Numbness     right hand pointer and middle finger  . Carpal tunnel syndrome     right  . Joint pain   . Joint swelling   . Nocturia   . Hypothyroidism     takes SYnthroid daily  . Anxiety     Past Surgical History  Procedure Laterality Date  . Hernia repair  2009    left inguinal  . Cataract extraction      bilateral  . Colonoscopy  2007    Dr. Laural Golden- L sided diverticulosis. Next TCS 2012 due to h/o tubular adenoma.  . Colonoscopy   02/26/2011    Procedure: COLONOSCOPY;  Surgeon: Daneil Dolin, MD;  [facility];  Service: Endoscopy;  Laterality: N/A;  11:05  . Tee without cardioversion  12/12/2011    Procedure: TRANSESOPHAGEAL ECHOCARDIOGRAM (TEE);  Surgeon: Sanda Klein, MD;  Location: [facility] END[facility];  Service: Cardiovascular;  Laterality: N/A;   successful/sinus rhythm  . Cardioversion  12/12/2011    Procedure: CARDIOVERSION;  Surgeon: Sanda Klein, MD;  Location: [facility];  Service: Cardiovascular;  Laterality: N/A;  . Bunionectomy Right 03/17/2013    Procedure: Lillard Anes, Arthroplasty 2nd toe right foot;  Surgeon: Marcheta Grammes, DPM;  Location: [facility];  Service: Orthopedics;  Laterality: Right;  . Aiken osteotomy Right 03/17/2013    Procedure: Barbie Banner OSTEOTOMY;  Surgeon: Marcheta Grammes, DPM;  [facility];  Service: Orthopedics;  Laterality: Right;  . Flexor tenotomy Left 03/17/2013    Procedure: PERCUTANEOUS FLEXOR TENOTOMY 3RD TOE LEFT FOOT;  Surgeon: Marcheta Grammes, DPM;  [facility];  Service: Orthopedics;  Laterality: Left;  . Cardiac catheterization  06/11/2007    PTCA X2 in 06/2007:  2 overlapping Cypher stents to LAD (2.5x13 and 3.0x23)  . Cardiac catheterization  06/21/2007    Cypher stent 2.5 x 13  to OM branch  . Coronary angioplasty      x 3   . Total knee arthroplasty Left 06/14/2013    Procedure: TOTAL KNEE ARTHROPLASTY;  Surgeon: Garald Balding, MD;  Location: [place];  Service: Orthopedics;  Laterality: Left;  . Rot Left     There were no vitals filed for this visit.  Visit Diagnosis:  Wrist stiffness, right  Stiffness of joint, hand, right  Muscle weakness  Lack of coordination      Subjective Assessment - 04/27/15 1047    Subjective  S: If I didn't have this calcium deposit or whatever it is I think it would be a lot better than it is.    Currently in Pain? Yes   Pain Score 2    Pain Location Wrist   Pain Orientation Right   Pain  Descriptors / Indicators Aching   Pain Type Acute pain            [facility] OT Assessment - 04/27/15 0852    Assessment   Diagnosis S/P right carpal tunnel release   Precautions   Precautions None   Type of Shoulder Precautions n/a   Coordination   9 Hole Peg Test Right   Right 9 Hole Peg Test 27.6"  previous: 34.5"   Edema   Edema Edema is the same as evaluation. MCPJs: Right: 24.0 cm Wrist crease: 21.3 cm   AROM   Right/Left Forearm Right   Right Forearm Pronation 90 Degrees  previous: 88   Right Forearm Supination 70 Degrees  previous: 58   Right Wrist Extension 42 Degrees  previous: 20   Right Wrist Flexion 70 Degrees  previous: 64   Right Wrist Radial Deviation 10 Degrees  previous: same   Right Wrist Ulnar Deviation 32 Degrees  previous: 28   Strength   Strength Assessment Site Wrist;Forearm   Right/Left Shoulder Right   Right/Left Forearm Right   Right Forearm Pronation 5/5   Right Forearm Supination 5/5   Right/Left Wrist Right   Right Wrist Flexion 5/5   Right Wrist Extension 4/5   Hand Function   Right Hand Grip (lbs) 45  previous: 15   Right Hand Lateral Pinch 17 lbs  previous: 8   Right Hand 3 Point Pinch 12 lbs  previous: 1                  OT Treatments/Exercises (OP) - 04/27/15 0919    Exercises   Exercises Hand;Wrist;Elbow   Additional Elbow Exercises   Hand Gripper with Large Beads 6/6 beads with gripp set at 25#   Hand Gripper with Medium Beads 13/13 beads wirh gripper set at 25#   Hand Gripper with Small Beads 14/14 beads with gripper set at 25#   Additional Wrist Exercises   Sponges 14,17                OT Education - 04/27/15 1048    Education provided Yes   Education Details Pt given green theraputty this session. Instructed to upgrade from red when able.    Person(s) Educated Patient   Methods Explanation   Comprehension Verbalized understanding          OT Short Term Goals - 04/27/15 0905    OT SHORT  TERM GOAL #1   Title Patient will be education and independent with HEP for improved functional use of right hand.   Time 4   Period  Weeks   Status Achieved   OT SHORT TERM GOAL #2   Title Patient will increase right wrist, forearm, and hand P/ROM to [facility] to increase ability to get dressed with less difficulty.   Time 4   Period Weeks   Status Achieved   OT SHORT TERM GOAL #3   Title Patient will increase right strength to 3+/5 to increase ability to pick up and stabalize household items.   Time 4   Period Weeks   Status Achieved   OT SHORT TERM GOAL #4   Title Patient will improve right grip strength by 10 pounds and pinch strength by 4# for improved ability to open jars.   Time 4   Period Weeks   Status Achieved   OT SHORT TERM GOAL #5   Title Patient will improve fine motor coordination by decreasing completion time on nine hole peg test by 25% for improved ability to fasten buttons and tie his shoes.   Time 4   Period Weeks   Status Achieved   OT SHORT TERM GOAL #6   Title Patient will decrease edema in his right hand by 1.0 cm for increased use of his right hand with functional tasks.    Time 4   Period Weeks   Status On-going   OT SHORT TERM GOAL #7   Title Patient will decrease pain in his right hand and wrist to 2/10 or better for improved use of his right hand with functional tasks.    Status Achieved           OT Long Term Goals - 04/27/15 0914    OT LONG TERM GOAL #1   Title Patient will return to highest level of independence with all daily tasks usingRUE.   Time 8   Period Weeks   Status On-going   OT LONG TERM GOAL #2   Title Patient will increase AROM in right wrist and hand  to WNL to increase ability to complete daily tasks and manipulate household objects.    Time 8   Period Weeks   Status On-going   OT LONG TERM GOAL #3   Title Patient will increase RUE forearm and wrist strength to 5/5 to increase ability to hold grandchildren.   Time 8   Period  Weeks   Status On-going   OT LONG TERM GOAL #4   Title Patient will decrease fascial restrictions to trace amount in his right hand and wrist.   Time 8   Period Weeks   Status On-going   OT LONG TERM GOAL #5   Title Patient will decrease pain level to 1/10 in his right wrist and hand  when completing daily tasks.   Time 8   Period Weeks   Status Achieved   OT LONG TERM GOAL #6   Title UPGRADED: Patient will improve right hand grip strength to 50# or more and pinch strength to 15# or better for improved ability to grip his rifle and pull trigger.   Time 8   Period Weeks   Status Revised   OT LONG TERM GOAL #7   Title Patient will decrease edema in his right hand by 1.5 cm or more for improved functional use of his right hand.    Time 8   Period Weeks   Status On-going   OT LONG TERM GOAL #8   Title Patient will have WNL fine motor coordination in his right hand for increased independence fastening buttons.    Time 8  Period Weeks   Status On-going               Plan - 2015/05/02 1233    Clinical Impression Statement A: Mini reassessment completed this session. Patient has met 6/7 short term goals and 2/8 long term goals. Pt is making great progress with strength, ROM and coordination. Pt reports that his greatest defcits continue to be sustained grip strength, ROM, and pain level. Pt complains of a spot on his wrist that he believes is a calcium deposit.    Plan P: Increase handgripper strength.          G-Codes - May 02, 2015 1242    Functional Assessment Tool Used grip strength right (45#) vs. left (50#) 90% independent 10% impaired    Functional Limitation Carrying, moving and handling objects   Carrying, Moving and Handling Objects Current Status (V3748) At least 1 percent but less than 20 percent impaired, limited or restricted   Carrying, Moving and Handling Objects Goal Status (O7078) At least 1 percent but less than 20 percent impaired, limited or restricted       Problem List Patient Active Problem List   Diagnosis Date Noted  . Arthritis of knee, left 06/16/2013  . S/P total knee replacement using cement 06/14/2013  . Osteoarthritis of left knee 03/21/2013  . BPH (benign prostatic hyperplasia)   . GERD (gastroesophageal reflux disease)   . CAD (coronary artery disease)   . Fever 03/20/2013  . Altered mental state 03/20/2013  . Postoperative fever 03/20/2013  . PNA (pneumonia) 03/20/2013  . Preop cardiovascular exam 11/24/2012  . Peroneal tendonitis 10/05/2012  . Arthritis of foot, degenerative 10/05/2012  . HTN (hypertension) 09/25/2012  . Hyperlipidemia 09/25/2012  . Atrial fibrillation ([place]) 06/15/2012  . Long term (current) use of anticoagulants 06/15/2012  . Rotator cuff strain 02/04/2012  . Shoulder pain 02/04/2012  . Personal history of colonic polyps 01/29/2011  . High risk medication use 01/29/2011  Occupational Therapy Progress Note  Dates of Reporting Period: 03/30/15  to 05-02-2015  Objective Reports of Subjective Statement: See above  Objective Measurements: See above  Goal Update: See above  Plan: See above  Reason Skilled Services are Required: Skilled OT services required to increase functional performance during daily tasks using RUE as dominant extremity.    Ailene Ravel, OTR/L,CBIS  [phone]  2015-05-02, 12:46 PM  [facility] [address] [place], [place], [address] Phone: [phone]   Fax:  [phone]  Name: Jerry Cain MRN: [id number] Date of Birth: 01-12-1932

## 2015-05-02 ENCOUNTER — Encounter (HOSPITAL_COMMUNITY): Payer: Self-pay

## 2015-05-02 ENCOUNTER — Ambulatory Visit (HOSPITAL_COMMUNITY): Payer: PPO

## 2015-05-02 DIAGNOSIS — M25631 Stiffness of right wrist, not elsewhere classified: Secondary | ICD-10-CM | POA: Diagnosis not present

## 2015-05-02 DIAGNOSIS — M6281 Muscle weakness (generalized): Secondary | ICD-10-CM

## 2015-05-02 DIAGNOSIS — R279 Unspecified lack of coordination: Secondary | ICD-10-CM

## 2015-05-02 DIAGNOSIS — M25641 Stiffness of right hand, not elsewhere classified: Secondary | ICD-10-CM

## 2015-05-02 NOTE — Therapy (Addendum)
[place], [place], [address] Phone: [phone]   Fax:  [phone]  Occupational Therapy Treatment  Patient Details  Name: Jerry Cain MRN: [id number] Date of Birth: 08-13-1931 Referring Provider: Dr. Durward Fortes  Encounter Date: 05/02/2015     05/08/15 1016  OT Visits / Re-Eval  Visit Number 9  Number of Visits 16  Date for OT Re-Evaluation 05/29/15  Authorization  Authorization Type Medicare  Authorization Time Period before the 18th visit,   Authorization - Visit Number 9  Authorization - Number of Visits 18  OT Time Calculation  OT Start Time 0845  OT Stop Time 0930  OT Time Calculation (min) 45 min  End of Session  Activity Tolerance Patient tolerated treatment well  Behavior During Therapy [facility] for tasks assessed/performed    Past Medical History  Diagnosis Date  . Hypercholesterolemia     takes Atorvastatin daily  . Gout   . BPH (benign prostatic hyperplasia)     takes Proscar daily  . CAD (coronary artery disease) 2009    cardiac cath and PTCA by Dr. Tami Ribas  . Tubular adenoma 2001  . Diverticulosis 2007    tcs by Dr. Laural Golden  . GERD (gastroesophageal reflux disease)   . Adenomatous colon polyp 2001  . Osteoarthritis     left knee  . Complication of anesthesia     hard to wake up  . Hypertension     takes Imdur,Coreg,and Lisinopril daily  . Atrial fibrillation ([place])     takes Coumadin daily  . Pneumonia     many,many yrs ago  . Numbness     right hand pointer and middle finger  . Carpal tunnel syndrome     right  . Joint pain   . Joint swelling   . Nocturia   . Hypothyroidism     takes SYnthroid daily  . Anxiety     Past Surgical History  Procedure Laterality Date  . Hernia repair  2009    left inguinal  . Cataract extraction      bilateral  . Colonoscopy  2007    Dr. Laural Golden- L sided diverticulosis. Next TCS 2012 due to h/o tubular adenoma.  . Colonoscopy   02/26/2011    Procedure: COLONOSCOPY;  Surgeon: Daneil Dolin, MD;  [facility];  Service: Endoscopy;  Laterality: N/A;  11:05  . Tee without cardioversion  12/12/2011    Procedure: TRANSESOPHAGEAL ECHOCARDIOGRAM (TEE);  Surgeon: Sanda Klein, MD;  Location: [facility] ENDOSCOPY;  Service: Cardiovascular;  Laterality: N/A;   successful/sinus rhythm  . Cardioversion  12/12/2011    Procedure: CARDIOVERSION;  Surgeon: Sanda Klein, MD;  Location: [place];  Service: Cardiovascular;  Laterality: N/A;  . Bunionectomy Right 03/17/2013    Procedure: Lillard Anes, Arthroplasty 2nd toe right foot;  Surgeon: Marcheta Grammes, DPM;  [facility];  Service: Orthopedics;  Laterality: Right;  . Aiken osteotomy Right 03/17/2013    Procedure: Barbie Banner OSTEOTOMY;  Surgeon: Marcheta Grammes, DPM;  [facility];  Service: Orthopedics;  Laterality: Right;  . Flexor tenotomy Left 03/17/2013    Procedure: PERCUTANEOUS FLEXOR TENOTOMY 3RD TOE LEFT FOOT;  Surgeon: Marcheta Grammes, DPM;  [facility];  Service: Orthopedics;  Laterality: Left;  . Cardiac catheterization  06/11/2007    PTCA X2 in 06/2007:  2 overlapping Cypher stents to LAD (2.5x13 and 3.0x23)  . Cardiac catheterization  06/21/2007    Cypher stent 2.5 x 13  to  OM branch  . Coronary angioplasty      x 3   . Total knee arthroplasty Left 06/14/2013    Procedure: TOTAL KNEE ARTHROPLASTY;  Surgeon: Garald Balding, MD;  Location: [place];  Service: Orthopedics;  Laterality: Left;  . Rot Left     There were no vitals filed for this visit.  Visit Diagnosis:  Wrist stiffness, right  Stiffness of joint, hand, right  Muscle weakness  Lack of coordination      Subjective Assessment - 05/02/15 0926    Subjective  S: the green putty hurts my wrist. It is harder than the red. The red is easy.   Currently in Pain? No/denies            [facility] OT Assessment - 05/02/15 0850    Assessment   Diagnosis S/P right  carpal tunnel release   Precautions   Precautions None   Type of Shoulder Precautions n/a                  OT Treatments/Exercises (OP) - 05/02/15 0850    Exercises   Exercises Hand;Wrist;Elbow   Additional Elbow Exercises   Theraputty Flatten;Roll;Grip;Locate Pegs   Theraputty - Flatten green siiting   Theraputty - Roll green   Theraputty - Grip green - supinated and pronated   Fine Motor Coordination   In Hand Manipulation Training Pt focused on in hand manipulation by picking up 5 coins one at a time and placing them in the palm of his hand. Pt then completed palm to finger tip transitioned. Max difficulty with last coin each time. Good thumb mobility this session.   Additional Wrist Exercises   Theraputty Flatten;Roll;Grip;Locate Pegs   Theraputty - Pinch green - 3 point and lateral                  OT Short Term Goals - 05/02/15 0913    OT SHORT TERM GOAL #1   Title Patient will be education and independent with HEP for improved functional use of right hand.   Time 4   Period Weeks   OT SHORT TERM GOAL #2   Title Patient will increase right wrist, forearm, and hand P/ROM to [facility] to increase ability to get dressed with less difficulty.   Time 4   Period Weeks   OT SHORT TERM GOAL #3   Title Patient will increase right strength to 3+/5 to increase ability to pick up and stabalize household items.   Time 4   Period Weeks   OT SHORT TERM GOAL #4   Title Patient will improve right grip strength by 10 pounds and pinch strength by 4# for improved ability to open jars.   Time 4   Period Weeks   OT SHORT TERM GOAL #5   Title Patient will improve fine motor coordination by decreasing completion time on nine hole peg test by 25% for improved ability to fasten buttons and tie his shoes.   Time 4   Period Weeks   OT SHORT TERM GOAL #6   Title Patient will decrease edema in his right hand by 1.0 cm for increased use of his right hand with functional tasks.     Time 4   Period Weeks   Status On-going   OT SHORT TERM GOAL #7   Title Patient will decrease pain in his right hand and wrist to 2/10 or better for improved use of his right hand with functional tasks.  OT Long Term Goals - 05/02/15 [id number]    OT LONG TERM GOAL #1   Title Patient will return to highest level of independence with all daily tasks usingRUE.   Time 8   Period Weeks   Status On-going   OT LONG TERM GOAL #2   Title Patient will increase AROM in right wrist and hand  to WNL to increase ability to complete daily tasks and manipulate household objects.    Time 8   Period Weeks   Status On-going   OT LONG TERM GOAL #3   Title Patient will increase RUE forearm and wrist strength to 5/5 to increase ability to hold grandchildren.   Time 8   Period Weeks   Status On-going   OT LONG TERM GOAL #4   Title Patient will decrease fascial restrictions to trace amount in his right hand and wrist.   Time 8   Period Weeks   Status On-going   OT LONG TERM GOAL #5   Title Patient will decrease pain level to 1/10 in his right wrist and hand  when completing daily tasks.   Time 8   Period Weeks   OT LONG TERM GOAL #6   Title Patient will improve right hand grip strength to 50# or more and pinch strength to 15# or better for improved ability to grip his rifle and pull trigger.   Time 8   Period Weeks   Status On-going   OT LONG TERM GOAL #7   Title Patient will decrease edema in his right hand by 1.5 cm or more for improved functional use of his right hand.    Time 8   Period Weeks   Status On-going   OT LONG TERM GOAL #8   Title Patient will have WNL fine motor coordination in his right hand for increased independence fastening buttons.    Time 8   Period Weeks   Status On-going         05/08/15 1019  OT Assessment and Plan  Clinical Impression Statement A: Pt had mod difficulty with coint activity. VC for form and technique.   Plan P: Continue with in hand  manipulation activity.            Problem List Patient Active Problem List   Diagnosis Date Noted  . Arthritis of knee, left 06/16/2013  . S/P total knee replacement using cement 06/14/2013  . Osteoarthritis of left knee 03/21/2013  . BPH (benign prostatic hyperplasia)   . GERD (gastroesophageal reflux disease)   . CAD (coronary artery disease)   . Fever 03/20/2013  . Altered mental state 03/20/2013  . Postoperative fever 03/20/2013  . PNA (pneumonia) 03/20/2013  . Preop cardiovascular exam 11/24/2012  . Peroneal tendonitis 10/05/2012  . Arthritis of foot, degenerative 10/05/2012  . HTN (hypertension) 09/25/2012  . Hyperlipidemia 09/25/2012  . Atrial fibrillation ([place]) 06/15/2012  . Long term (current) use of anticoagulants 06/15/2012  . Rotator cuff strain 02/04/2012  . Shoulder pain 02/04/2012  . Personal history of colonic polyps 01/29/2011  . High risk medication use 01/29/2011    Ailene Ravel, OTR/L,CBIS  [phone]  05/02/2015, 9:27 AM  [facility] [address] [place], [place], [address] Phone: [phone]   Fax:  [phone]  Name: DAX VOLD MRN: [id number] Date of Birth: Jun 09, 1931

## 2015-05-04 ENCOUNTER — Ambulatory Visit (HOSPITAL_COMMUNITY): Payer: PPO

## 2015-05-05 ENCOUNTER — Other Ambulatory Visit: Payer: Self-pay | Admitting: Cardiovascular Disease

## 2015-05-06 NOTE — Progress Notes (Signed)
[name], male   DOB: 1931/12/22, 82 y.o.   MRN: [id number]    Cardiology Office Note    Date:  05/07/2015   ID:  Jerry Cain, DOB Jul 24, 1931, MRN [id number]  PCP:  Octavio Graves, DO  Cardiologist:   Sanda Klein, MD   Chief Complaint  Patient presents with  . Annual Exam    overdue//AFIB//pt c/o indigestion, no other Sx. or concerns.    History of Present Illness:  WALDON Cain is a 82 y.o. male with CAD and paroxysmal atrial fibrillation, here for follow up. He has been undergoing rehab for his shoulder. He is on warfarin anticoagulation.  After undergoing rotator cuff surgery and carpal tunnel surgery he has not been able to return to swimming, but he exercises at the [facility] on the upright ergometer. He has also started walking. He has noticed that if he "walks in a big hurry" she develops some chest tightness consistent with exertional angina pectoris. This resolves promptly when he rests and does not occur during other types of exercise or daily activities. He denies shortness of breath, dizziness, palpitations, near-syncope or syncope. He has not had any bleeding problems on treatment with warfarin. His INR today is 2.5. He denies any focal neurological symptoms..  In 2009, he received drug eluting Cypher stents to the LAD and the circumflex coronary arteries. Comorbidity includes systemic hypertension and hyperlipidemia both of which are adequately treated. By echocardiography in 2013 he has preserved left ventricular systolic function and no valvular abnormalities. He had a normal stress nuclear myocardial scan in September of 2013.     Past Medical History  Diagnosis Date  . Hypercholesterolemia     takes Atorvastatin daily  . Gout   . BPH (benign prostatic hyperplasia)     takes Proscar daily  . CAD (coronary artery disease) 2009    cardiac cath and PTCA by Dr. Tami Ribas  . Tubular adenoma 2001  . Diverticulosis 2007    tcs by Dr.  Laural Golden  . GERD (gastroesophageal reflux disease)   . Adenomatous colon polyp 2001  . Osteoarthritis     left knee  . Complication of anesthesia     hard to wake up  . Hypertension     takes Imdur,Coreg,and Lisinopril daily  . Atrial fibrillation ([place])     takes Coumadin daily  . Pneumonia     many,many yrs ago  . Numbness     right hand pointer and middle finger  . Carpal tunnel syndrome     right  . Joint pain   . Joint swelling   . Nocturia   . Hypothyroidism     takes SYnthroid daily  . Anxiety     Past Surgical History  Procedure Laterality Date  . Hernia repair  2009    left inguinal  . Cataract extraction      bilateral  . Colonoscopy  2007    Dr. Laural Golden- L sided diverticulosis. Next TCS 2012 due to h/o tubular adenoma.  . Colonoscopy  02/26/2011    Procedure: COLONOSCOPY;  Surgeon: Daneil Dolin, MD;  [facility];  Service: Endoscopy;  Laterality: N/A;  11:05  . Tee without cardioversion  12/12/2011    Procedure: TRANSESOPHAGEAL ECHOCARDIOGRAM (TEE);  Surgeon: Sanda Klein, MD;  Location: [facility] ENDOSCOPY;  Service: Cardiovascular;  Laterality: N/A;   successful/sinus rhythm  . Cardioversion  12/12/2011    Procedure: CARDIOVERSION;  Surgeon: Sanda Klein, MD;  Location: [facility];  Service: Cardiovascular;  Laterality: N/A;  . Bunionectomy Right 03/17/2013    Procedure: Lillard Anes, Arthroplasty 2nd toe right foot;  Surgeon: Marcheta Grammes, DPM;  [facility];  Service: Orthopedics;  Laterality: Right;  . Aiken osteotomy Right 03/17/2013    Procedure: Barbie Banner OSTEOTOMY;  Surgeon: Marcheta Grammes, DPM;  [facility];  Service: Orthopedics;  Laterality: Right;  . Flexor tenotomy Left 03/17/2013    Procedure: PERCUTANEOUS FLEXOR TENOTOMY 3RD TOE LEFT FOOT;  Surgeon: Marcheta Grammes, DPM;  [facility];  Service: Orthopedics;  Laterality: Left;  . Cardiac catheterization  06/11/2007    PTCA X2 in 06/2007:  2 overlapping  Cypher stents to LAD (2.5x13 and 3.0x23)  . Cardiac catheterization  06/21/2007    Cypher stent 2.5 x 13  to OM branch  . Coronary angioplasty      x 3   . Total knee arthroplasty Left 06/14/2013    Procedure: TOTAL KNEE ARTHROPLASTY;  Surgeon: Garald Balding, MD;  Location: [facility];  Service: Orthopedics;  Laterality: Left;  . Rot Left     Outpatient Prescriptions Prior to Visit  Medication Sig Dispense Refill  . aspirin 81 MG tablet Take 81 mg by mouth every morning.     . Calcium Carbonate-Vitamin D (CALCIUM + D) 600-200 MG-UNIT TABS Take 1 tablet by mouth 2 (two) times daily.     . carvedilol (COREG) 6.25 MG tablet Take 1 tablet (6.25 mg total) by mouth 2 (two) times daily. 60 tablet 0  . finasteride (PROSCAR) 5 MG tablet Take 5 mg by mouth every morning.     . Glucosamine 750 MG TABS Take 750 mg by mouth 2 (two) times daily.    Marland Kitchen levothyroxine (SYNTHROID, LEVOTHROID) 125 MCG tablet Take 125 mcg by mouth daily before breakfast.     . lisinopril (PRINIVIL,ZESTRIL) 20 MG tablet Take 1 tablet (20 mg total) by mouth at bedtime. MUST KEEP APPOINTMENT 05/07/2015 WITH DR Siena Poehler FOR FUTURE REFILLS 30 tablet 0  . Multiple Vitamin (MULTIVITAMIN) capsule Take 1 capsule by mouth every morning.     . nitroGLYCERIN (NITROSTAT) 0.4 MG SL tablet Place 1 tablet (0.4 mg total) under the tongue every 5 (five) minutes as needed for chest pain. 25 tablet 3  . rosuvastatin (CRESTOR) 10 MG tablet TAKE (1) TABLET BY MOUTH ONCE A WEEK. 4 tablet 0  . traMADol (ULTRAM) 50 MG tablet Take 50 mg by mouth every 6 (six) hours as needed.     . warfarin (COUMADIN) 5 MG tablet Take 1 tablet by mouth daily or as directed by coumadin clinic 90 tablet 1  . isosorbide mononitrate (IMDUR) 30 MG 24 hr tablet TAKE ONE TABLET ONCE DAILY IN THE MORNING. 30 tablet 3   No facility-administered medications prior to visit.     Allergies:   Carbapenems; Cephalosporins; and Penicillins   Social History   Social History  .  Marital Status: Married    Spouse Name: N/A  . Number of Children: 4  . Years of Education: N/A   Occupational History  . retired    Social History Main Topics  . Smoking status: Never Smoker   . Smokeless tobacco: None  . Alcohol Use: No  . Drug Use: No  . Sexual Activity: Not Asked   Other Topics Concern  . None   Social History Narrative     Family History:  The patient's family history includes Heart disease in his brother; Pneumonia in his father. There is no history of Colon  cancer.   ROS:   Please see the history of present illness.    ROS All other systems reviewed and are negative.   PHYSICAL EXAM:   VS:  BP 148/84 mmHg  Pulse 59  Ht 6' (1.829 m)  Wt 89.132 kg (196 lb 8 oz)  BMI 26.64 kg/m2   GEN: Well nourished, well developed, in no acute distress HEENT: normal Neck: no JVD, carotid bruits, or masses Cardiac: RRR; widely split second heart sound, no murmurs, rubs, or gallops,no edema  Respiratory:  clear to auscultation bilaterally, normal work of breathing GI: soft, nontender, nondistended, + BS MS: no deformity or atrophy Skin: warm and dry, no rash Neuro:  Alert and Oriented x 3, Strength and sensation are intact Psych: euthymic mood, full affect  Wt Readings from Last 3 Encounters:  05/07/15 89.132 kg (196 lb 8 oz)  11/23/14 83.915 kg (185 lb)  09/25/14 86.183 kg (190 lb)      Studies/Labs Reviewed:   EKG:  EKG is ordered today.  The ekg ordered today demonstrates sinus rhythm with premature atrial contractions, first-degree AV block, right bundle branch block, left anterior fascicular block (trifascicular block)  Recent Labs: 11/23/2014: BUN 20; Creatinine, Ser 0.91; Hemoglobin 11.7*; Platelets 263; Potassium 3.8; Sodium 134*   Lipid Panel    Component Value Date/Time   CHOL  06/19/2007 0223    104        ATP III CLASSIFICATION:  <200     mg/dL   Desirable  200-239  mg/dL   Borderline High  >=240    mg/dL   High   TRIG 102 06/19/2007  0223   HDL 19* 06/19/2007 0223   CHOLHDL 5.5 06/19/2007 0223   VLDL 20 06/19/2007 0223   LDLCALC  06/19/2007 0223    65        Total Cholesterol/HDL:CHD Risk Coronary Heart Disease Risk Table                     Men   Women  1/2 Average Risk   3.4   3.3       ASSESSMENT:    1. Coronary artery disease involving native coronary artery of native heart with angina pectoris ([place])   2. Essential hypertension   3. Paroxysmal atrial fibrillation ([facility])   4. Long term (current) use of anticoagulants   5. Trifascicular block      PLAN:  In order of problems listed above:  1. CAD: He is describing CCS class II exertional angina pectoris. Increase isosorbide to 60 mg daily. Discussed the difference obtained stable angina and unstable angina. Pointed out that he needs to seek immediate attention if he develops unstable symptoms. 2. HTN: Borderline blood pressure control. He states this is typical when he comes to the office. At home his typical systolic blood pressure has been in the 120s. 3. PAF: No clinically obvious events in the last couple of months. He is tolerating warfarin without problems. 4. Warfarin anticoagulation: Therapeutic INR today. He is also taking aspirin which increases the risk of bleeding, but I think this should be continued until we clarify which way his anginal symptoms are going. 5. Trifascicular block: No signs or symptoms of high-grade AV block. Discussed symptoms of AV block that he should be aware of. We might need to reduce his dose of carvedilol if he develops bradycardia or evidence of high-grade AV block.    Medication Adjustments/Labs and Tests Ordered: Current medicines are reviewed at length with  the patient today.  Concerns regarding medicines are outlined above.  Medication changes, Labs and Tests ordered today are listed in the Patient Instructions below. Patient Instructions  Your physician has recommended you make the following change in your  medication: INCREASE ISOSORBIDE TO 60 MG DAILY.  A NEW RX HAS BEEN SENT TO YOUR PHARMACY FOR THE 60 MG TABLETS.  Dr. Sallyanne Kuster recommends that you schedule a follow-up appointment in: ONE YEAR     SignedSanda Klein, MD  05/07/2015 9:27 AM    [place], [place], [place]  [address] Phone: [phone]; Fax: [phone]

## 2015-05-07 ENCOUNTER — Encounter: Payer: Self-pay | Admitting: Cardiovascular Disease

## 2015-05-07 ENCOUNTER — Ambulatory Visit (INDEPENDENT_AMBULATORY_CARE_PROVIDER_SITE_OTHER): Payer: PPO | Admitting: Pharmacist Clinician (PhC)/ Clinical Pharmacy Specialist

## 2015-05-07 ENCOUNTER — Ambulatory Visit (INDEPENDENT_AMBULATORY_CARE_PROVIDER_SITE_OTHER): Payer: PPO | Admitting: Cardiovascular Disease

## 2015-05-07 VITALS — BP 148/84 | HR 59 | Ht 72.0 in | Wt 196.5 lb

## 2015-05-07 DIAGNOSIS — Z7901 Long term (current) use of anticoagulants: Secondary | ICD-10-CM | POA: Diagnosis not present

## 2015-05-07 DIAGNOSIS — I4891 Unspecified atrial fibrillation: Secondary | ICD-10-CM

## 2015-05-07 DIAGNOSIS — I25119 Atherosclerotic heart disease of native coronary artery with unspecified angina pectoris: Secondary | ICD-10-CM

## 2015-05-07 DIAGNOSIS — I480 Paroxysmal atrial fibrillation: Secondary | ICD-10-CM

## 2015-05-07 DIAGNOSIS — I10 Essential (primary) hypertension: Secondary | ICD-10-CM | POA: Diagnosis not present

## 2015-05-07 DIAGNOSIS — I453 Trifascicular block: Secondary | ICD-10-CM | POA: Insufficient documentation

## 2015-05-07 LAB — POCT INR: INR: 2.5

## 2015-05-07 MED ORDER — ISOSORBIDE MONONITRATE ER 60 MG PO TB24: 60.0000 mg | ORAL_TABLET | Freq: Every day | ORAL | Status: DC

## 2015-05-07 NOTE — Patient Instructions (Signed)
Your physician has recommended you make the following change in your medication: INCREASE ISOSORBIDE TO 60 MG DAILY.  A NEW RX HAS BEEN SENT TO YOUR PHARMACY FOR THE 60 MG TABLETS.  Dr. Sallyanne Kuster recommends that you schedule a follow-up appointment in: [place]

## 2015-05-07 NOTE — Telephone Encounter (Signed)
Rx request sent to pharmacy.  

## 2015-05-08 ENCOUNTER — Ambulatory Visit (HOSPITAL_COMMUNITY): Payer: PPO | Admitting: Occupational Therapy

## 2015-05-08 ENCOUNTER — Encounter (HOSPITAL_COMMUNITY): Payer: Self-pay | Admitting: Occupational Therapy

## 2015-05-08 DIAGNOSIS — M25531 Pain in right wrist: Secondary | ICD-10-CM

## 2015-05-08 DIAGNOSIS — M6281 Muscle weakness (generalized): Secondary | ICD-10-CM

## 2015-05-08 DIAGNOSIS — M25631 Stiffness of right wrist, not elsewhere classified: Secondary | ICD-10-CM

## 2015-05-08 DIAGNOSIS — R279 Unspecified lack of coordination: Secondary | ICD-10-CM

## 2015-05-08 DIAGNOSIS — M25641 Stiffness of right hand, not elsewhere classified: Secondary | ICD-10-CM

## 2015-05-08 NOTE — Therapy (Signed)
[facility] [place], [place], [address] Phone: [phone]   Fax:  [phone]  Occupational Therapy Treatment  Patient Details  Name: Jerry Cain MRN: [id number] Date of Birth: [birth year] Referring Provider: Dr. Durward Fortes  Encounter Date: 05/08/2015      OT End of Session - 05/08/15 1154    Visit Number 10   Number of Visits 16   Date for OT Re-Evaluation 05/29/15   Authorization Type Medicare   Authorization Time Period before the 18th visit,    Authorization - Visit Number 10   Authorization - Number of Visits 18   OT Start Time 1019   OT Stop Time 1101   OT Time Calculation (min) 42 min   Activity Tolerance Patient tolerated treatment well   Behavior During Therapy [facility] for tasks assessed/performed      Past Medical History  Diagnosis Date  . Hypercholesterolemia     takes Atorvastatin daily  . Gout   . BPH (benign prostatic hyperplasia)     takes Proscar daily  . CAD (coronary artery disease) 2009    cardiac cath and PTCA by Dr. Tami Ribas  . Tubular adenoma 2001  . Diverticulosis 2007    tcs by Dr. Laural Golden  . GERD (gastroesophageal reflux disease)   . Adenomatous colon polyp 2001  . Osteoarthritis     left knee  . Complication of anesthesia     hard to wake up  . Hypertension     takes Imdur,Coreg,and Lisinopril daily  . Atrial fibrillation ([place])     takes Coumadin daily  . Pneumonia     many,many yrs ago  . Numbness     right hand pointer and middle finger  . Carpal tunnel syndrome     right  . Joint pain   . Joint swelling   . Nocturia   . Hypothyroidism     takes SYnthroid daily  . Anxiety     Past Surgical History  Procedure Laterality Date  . Hernia repair  2009    left inguinal  . Cataract extraction      bilateral  . Colonoscopy  2007    Dr. Laural Golden- L sided diverticulosis. Next TCS 2012 due to h/o tubular adenoma.  . Colonoscopy  02/26/2011    Procedure: COLONOSCOPY;   Surgeon: Daneil Dolin, MD;  [facility];  Service: Endoscopy;  Laterality: N/A;  11:05  . Tee without cardioversion  12/12/2011    Procedure: TRANSESOPHAGEAL ECHOCARDIOGRAM (TEE);  Surgeon: Sanda Klein, MD;  Location: [facility] ENDOSCOPY;  Service: Cardiovascular;  Laterality: N/A;   successful/sinus rhythm  . Cardioversion  12/12/2011    Procedure: CARDIOVERSION;  Surgeon: Sanda Klein, MD;  Location: [facility];  Service: Cardiovascular;  Laterality: N/A;  . Bunionectomy Right 03/17/2013    Procedure: Lillard Anes, Arthroplasty 2nd toe right foot;  Surgeon: Marcheta Grammes, DPM;  [facility];  Service: Orthopedics;  Laterality: Right;  . Aiken osteotomy Right 03/17/2013    Procedure: Barbie Banner OSTEOTOMY;  Surgeon: Marcheta Grammes, DPM;  [facility];  Service: Orthopedics;  Laterality: Right;  . Flexor tenotomy Left 03/17/2013    Procedure: PERCUTANEOUS FLEXOR TENOTOMY 3RD TOE LEFT FOOT;  Surgeon: Marcheta Grammes, DPM;  [facility];  Service: Orthopedics;  Laterality: Left;  . Cardiac catheterization  06/11/2007    PTCA X2 in 06/2007:  2 overlapping Cypher stents to LAD (2.5x13 and 3.0x23)  . Cardiac catheterization  06/21/2007    Cypher  stent 2.5 x 13  to OM branch  . Coronary angioplasty      x 3   . Total knee arthroplasty Left 06/14/2013    Procedure: TOTAL KNEE ARTHROPLASTY;  Surgeon: Garald Balding, MD;  Location: [facility];  Service: Orthopedics;  Laterality: Left;  . Rot Left     There were no vitals filed for this visit.  Visit Diagnosis:  Wrist stiffness, right  Stiffness of joint, hand, right  Muscle weakness  Right wrist pain  Lack of coordination      Subjective Assessment - 05/08/15 1020    Subjective  S: That green putty makes my hand sore.    Currently in Pain? No/denies            [facility] OT Assessment - 05/08/15 1152    Assessment   Diagnosis S/P right carpal tunnel release   Precautions   Precautions None    Type of Shoulder Precautions n/a                  OT Treatments/Exercises (OP) - 05/08/15 1042    Exercises   Exercises Hand;Wrist;Elbow   Additional Elbow Exercises   Theraputty Flatten;Roll;Grip;Pinch   Theraputty - Flatten green sitting   Theraputty - Grip green - supinated and pronated   Hand Gripper with Large Beads 6/6 beads with gripp set at 29#   Hand Gripper with Medium Beads 13/13 beads wirh gripper set at 29#   Hand Gripper with Small Beads 17/17 beads with gripper set at 29#   Fine Motor Coordination   In Hand Manipulation Training Pt focused on in hand manipulation by picking up 5 to 8 coins one at a time from tabletop and placing them in the palm of his hand. Pt then completed palm to finger tip transitioned. Max difficulty with last coin each time. Good thumb mobility this session.   Additional Wrist Exercises   Sponges 20, 18   Theraputty - Pinch green - 3 point and lateral                  OT Short Term Goals - 05/02/15 0913    OT SHORT TERM GOAL #1   Title Patient will be education and independent with HEP for improved functional use of right hand.   Time 4   Period Weeks   OT SHORT TERM GOAL #2   Title Patient will increase right wrist, forearm, and hand P/ROM to [facility] to increase ability to get dressed with less difficulty.   Time 4   Period Weeks   OT SHORT TERM GOAL #3   Title Patient will increase right strength to 3+/5 to increase ability to pick up and stabalize household items.   Time 4   Period Weeks   OT SHORT TERM GOAL #4   Title Patient will improve right grip strength by 10 pounds and pinch strength by 4# for improved ability to open jars.   Time 4   Period Weeks   OT SHORT TERM GOAL #5   Title Patient will improve fine motor coordination by decreasing completion time on nine hole peg test by 25% for improved ability to fasten buttons and tie his shoes.   Time 4   Period Weeks   OT SHORT TERM GOAL #6   Title Patient will  decrease edema in his right hand by 1.0 cm for increased use of his right hand with functional tasks.    Time 4   Period Weeks  Status On-going   OT SHORT TERM GOAL #7   Title Patient will decrease pain in his right hand and wrist to 2/10 or better for improved use of his right hand with functional tasks.            OT Long Term Goals - 05/02/15 [id number]    OT LONG TERM GOAL #1   Title Patient will return to highest level of independence with all daily tasks usingRUE.   Time 8   Period Weeks   Status On-going   OT LONG TERM GOAL #2   Title Patient will increase AROM in right wrist and hand  to WNL to increase ability to complete daily tasks and manipulate household objects.    Time 8   Period Weeks   Status On-going   OT LONG TERM GOAL #3   Title Patient will increase RUE forearm and wrist strength to 5/5 to increase ability to hold grandchildren.   Time 8   Period Weeks   Status On-going   OT LONG TERM GOAL #4   Title Patient will decrease fascial restrictions to trace amount in his right hand and wrist.   Time 8   Period Weeks   Status On-going   OT LONG TERM GOAL #5   Title Patient will decrease pain level to 1/10 in his right wrist and hand  when completing daily tasks.   Time 8   Period Weeks   OT LONG TERM GOAL #6   Title Patient will improve right hand grip strength to 50# or more and pinch strength to 15# or better for improved ability to grip his rifle and pull trigger.   Time 8   Period Weeks   Status On-going   OT LONG TERM GOAL #7   Title Patient will decrease edema in his right hand by 1.5 cm or more for improved functional use of his right hand.    Time 8   Period Weeks   Status On-going   OT LONG TERM GOAL #8   Title Patient will have WNL fine motor coordination in his right hand for increased independence fastening buttons.    Time 8   Period Weeks   Status On-going               Plan - 05/08/15 1155    Clinical Impression Statement A: Pt  focus on grip strength and in-hand manipulation this session. Pt with mod difficulty during coin task, reports increased soreness along dorsal wrist during grip tasks.    Plan P: Continue working on grip and pinch strengthening, as well as in-hand manipulation tasks.         Problem List Patient Active Problem List   Diagnosis Date Noted  . 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 Arthritis of knee, left 06/16/2013  . S/P total knee replacement using cement 06/14/2013  . Osteoarthritis of left knee 03/21/2013  . BPH (benign prostatic hyperplasia)   . GERD (gastroesophageal reflux disease)   . CAD (coronary artery disease)   . Fever 03/20/2013  . Altered mental state 03/20/2013  . Postoperative fever 03/20/2013  . PNA (pneumonia) 03/20/2013  . Preop cardiovascular exam 11/24/2012  . Peroneal tendonitis 10/05/2012  . Arthritis of foot, degenerative 10/05/2012  . HTN (hypertension) 09/25/2012  . Hyperlipidemia 09/25/2012  . Atrial fibrillation ([place]) 06/15/2012  . Long term (current) use of anticoagulants 06/15/2012  . Rotator cuff strain 02/04/2012  . Shoulder pain 02/04/2012  . Personal history of colonic polyps 01/29/2011  .  High risk medication use 01/29/2011    Guadelupe Sabin, OTR/L  [phone]  05/08/2015, 11:57 AM  [facility] [address] [place], [place], [address] Phone: [phone]   Fax:  [phone]  Name: Jerry Cain MRN: [id number] Date of Birth: 01/30/1932

## 2015-05-10 ENCOUNTER — Telehealth (HOSPITAL_COMMUNITY): Payer: Self-pay

## 2015-05-10 NOTE — Telephone Encounter (Signed)
Pateint called to say: Dr Durward Fortes was very pleased with out services and that he and the pt think we did an excellent job.NF 05/10/15 Please Dishcarge.

## 2015-05-11 ENCOUNTER — Ambulatory Visit (HOSPITAL_COMMUNITY): Payer: PPO

## 2015-05-11 ENCOUNTER — Encounter (HOSPITAL_COMMUNITY): Payer: Self-pay

## 2015-05-11 NOTE — Therapy (Unsigned)
[facility] [address] [place], [place], [address] Phone: [phone]   Fax:  [phone]  Patient Details  Name: Jerry Cain MRN: [id number] Date of Birth: 06/19/1931 Referring Provider:  No ref. provider found  Encounter Date: 05/11/2015 OCCUPATIONAL THERAPY DISCHARGE SUMMARY  Visits from Start of Care: 10  Current functional level related to goals / functional outcomes: OT LONG TERM GOAL #1    Title Patient will return to highest level of independence with all daily tasks usingRUE.   Time 8   Period Weeks   Status On-going   OT LONG TERM GOAL #2   Title Patient will increase AROM in right wrist and hand to WNL to increase ability to complete daily tasks and manipulate household objects.    Time 8   Period Weeks   Status On-going   OT LONG TERM GOAL #3   Title Patient will increase RUE forearm and wrist strength to 5/5 to increase ability to hold grandchildren.   Time 8   Period Weeks   Status On-going   OT LONG TERM GOAL #4   Title Patient will decrease fascial restrictions to trace amount in his right hand and wrist.   Time 8   Period Weeks   Status On-going   OT LONG TERM GOAL #5   Title Patient will decrease pain level to 1/10 in his right wrist and hand when completing daily tasks.   Time 8   Period Weeks   OT LONG TERM GOAL #6   Title Patient will improve right hand grip strength to 50# or more and pinch strength to 15# or better for improved ability to grip his rifle and pull trigger.   Time 8   Period Weeks   Status On-going   OT LONG TERM GOAL #7   Title Patient will decrease edema in his right hand by 1.5 cm or more for improved functional use of his right hand.    Time 8   Period Weeks   Status On-going   OT LONG TERM GOAL #8   Title Patient will have WNL fine motor coordination in his right hand for  increased independence fastening buttons.    Time 8   Period Weeks   Status On-going          Remaining deficits: No long term goals have been met. Pt continues to show deficits in coordination, strength, edema, and ROM. Patient is unable to complete all daily tasks using his right hand as dominant.    Education / Equipment: Wrist and hand strengthening exercises.  Plan: Patient agrees to discharge.  Patient goals were not met. Patient is being discharged due to the physician's request.  ?????       Laura Essenmacher, OTR/L,CBIS  [phone]  05/11/2015, 8:08 AM  [facility] [address] [place], [place], [address] Phone: [phone]   Fax:  [phone] 

## 2015-05-15 ENCOUNTER — Encounter (HOSPITAL_COMMUNITY): Payer: PPO

## 2015-05-18 ENCOUNTER — Encounter (HOSPITAL_COMMUNITY): Payer: PPO | Admitting: Specialist

## 2015-05-19 ENCOUNTER — Other Ambulatory Visit: Payer: Self-pay | Admitting: Cardiovascular Disease

## 2015-05-21 NOTE — Telephone Encounter (Signed)
Rx request sent to pharmacy.  

## 2015-06-04 ENCOUNTER — Ambulatory Visit (INDEPENDENT_AMBULATORY_CARE_PROVIDER_SITE_OTHER): Payer: PPO | Admitting: *Deleted

## 2015-06-04 DIAGNOSIS — I4891 Unspecified atrial fibrillation: Secondary | ICD-10-CM | POA: Diagnosis not present

## 2015-06-04 DIAGNOSIS — Z7901 Long term (current) use of anticoagulants: Secondary | ICD-10-CM

## 2015-06-04 LAB — POCT INR: INR: 2.5

## 2015-06-11 ENCOUNTER — Encounter (HOSPITAL_COMMUNITY): Payer: Self-pay

## 2015-06-13 DIAGNOSIS — E039 Hypothyroidism, unspecified: Secondary | ICD-10-CM | POA: Diagnosis not present

## 2015-06-13 DIAGNOSIS — B351 Tinea unguium: Secondary | ICD-10-CM | POA: Diagnosis not present

## 2015-06-16 ENCOUNTER — Other Ambulatory Visit: Payer: Self-pay | Admitting: Cardiovascular Disease

## 2015-07-16 ENCOUNTER — Ambulatory Visit (INDEPENDENT_AMBULATORY_CARE_PROVIDER_SITE_OTHER): Payer: PPO | Admitting: *Deleted

## 2015-07-16 DIAGNOSIS — Z7901 Long term (current) use of anticoagulants: Secondary | ICD-10-CM | POA: Diagnosis not present

## 2015-07-16 DIAGNOSIS — I4891 Unspecified atrial fibrillation: Secondary | ICD-10-CM | POA: Diagnosis not present

## 2015-07-16 LAB — POCT INR: INR: 2.3

## 2015-07-25 DIAGNOSIS — B351 Tinea unguium: Secondary | ICD-10-CM | POA: Diagnosis not present

## 2015-07-25 DIAGNOSIS — E039 Hypothyroidism, unspecified: Secondary | ICD-10-CM | POA: Diagnosis not present

## 2015-07-25 DIAGNOSIS — I482 Chronic atrial fibrillation: Secondary | ICD-10-CM | POA: Diagnosis not present

## 2015-07-25 DIAGNOSIS — E785 Hyperlipidemia, unspecified: Secondary | ICD-10-CM | POA: Diagnosis not present

## 2015-07-30 ENCOUNTER — Telehealth: Payer: Self-pay | Admitting: Cardiovascular Disease

## 2015-07-30 NOTE — Telephone Encounter (Signed)
Line busy when dialed. 

## 2015-07-30 NOTE — Telephone Encounter (Signed)
Pt called in stating that his heart has been out of rhythm and he would like to be advised on what to do. Please f/u with him  Thanks

## 2015-07-30 NOTE — Telephone Encounter (Signed)
2nd attempt. Line busy when dialed.

## 2015-07-31 NOTE — Telephone Encounter (Signed)
Work Psychologist, occupational busy. Attempted to reach on home line, got VM message.  Left msg for patient to call.

## 2015-08-01 NOTE — Telephone Encounter (Signed)
Follow up   Pt verbalized that he needs to speak with someone he called about his heart going out of rhythm   Patient c/o Palpitations:  High priority if patient c/o lightheadedness and shortness of breath.  1. How long have you been having palpitations? SINCE MONDAY  2. Are you currently experiencing lightheadedness and shortness of breath? NO 3. Have you checked your BP and heart rate? (document readings) 81    123/129 4. Are you experiencing any other symptoms? NO

## 2015-08-01 NOTE — Telephone Encounter (Signed)
No answer. Left message to call back.   

## 2015-08-01 NOTE — Telephone Encounter (Signed)
Returned call to pt. He is concerned that his BP is running too high or to low at times.  The past couple days he has taken his BP multiple times. He takes his readings in the morning before taking his BP pills and then sporadically throughout the day several times a day. Denies CP, SOB, dizziness or palpitations. He says "I feel fine."   He had a one -time BP of 167/98, HR 123, two days ago. His recheck that time it as 116/78, HR 87. Today BP was 127/100, HR 109, then on recheck 20 min later it was 127/74, HR 78. He had taken his morning BP meds in between that time. He did report a low BP of 98/62, HR 75 the previous day.  Pt denied having dizziness, CP, SOB, or palpitations with any of his BP readings he reported.  Encouraged pt to keep a BP log to bring to him to his next appt which was just scheduled for 08/16/15.  Encouraged pt that he only needed to take his BP two times a day unless he had symptoms such as CP, SOB, palpitations, or dizziness.  Told pt to call with readings if they are too high or too low and if he has any questions. Pt verbalized understanding to go to the emergency room or call 911 if he develops CP, SOB, palpitations, nausea, vomiting, or sweating.  Will route to Dr Sallyanne Kuster for review.

## 2015-08-01 NOTE — Telephone Encounter (Signed)
Automatic BP monitors are often erroneous and give confusing readings when the rhythm is irregular (such as atrial fibrillation). Please check 3 times in a row and try to get an "average" BP reading when the rhythm is irregular.

## 2015-08-16 ENCOUNTER — Ambulatory Visit (INDEPENDENT_AMBULATORY_CARE_PROVIDER_SITE_OTHER): Payer: PPO | Admitting: Cardiovascular Disease

## 2015-08-16 ENCOUNTER — Encounter: Payer: Self-pay | Admitting: Cardiovascular Disease

## 2015-08-16 VITALS — BP 118/72 | HR 90 | Ht 70.0 in | Wt 204.2 lb

## 2015-08-16 DIAGNOSIS — I25119 Atherosclerotic heart disease of native coronary artery with unspecified angina pectoris: Secondary | ICD-10-CM

## 2015-08-16 DIAGNOSIS — I10 Essential (primary) hypertension: Secondary | ICD-10-CM | POA: Diagnosis not present

## 2015-08-16 DIAGNOSIS — I4891 Unspecified atrial fibrillation: Secondary | ICD-10-CM | POA: Diagnosis not present

## 2015-08-16 DIAGNOSIS — I453 Trifascicular block: Secondary | ICD-10-CM | POA: Diagnosis not present

## 2015-08-16 DIAGNOSIS — Z7901 Long term (current) use of anticoagulants: Secondary | ICD-10-CM

## 2015-08-16 MED ORDER — CARVEDILOL 12.5 MG PO TABS: 12.5000 mg | ORAL_TABLET | Freq: Two times a day (BID) | ORAL | Status: DC

## 2015-08-16 NOTE — Progress Notes (Signed)
[name], male   DOB: 01-15-1932, 82 y.o.   MRN: [id number] [name], male   DOB: 08/13/1931, 82 y.o.   MRN: [id number]    Cardiology Office Note    Date:  08/16/2015   ID:  Jerry Cain, DOB 18-Oct-1931, MRN [id number]  PCP:  Jerry Graves, DO  Cardiologist:   Jerry Klein, MD   Chief Complaint  Patient presents with  . Follow-up    patient reports his heart is racing - "out of rhythm," associated with shortness of breath    History of Present Illness:  Jerry Cain is a 82 y.o. male with CAD and paroxysmal atrial fibrillation, here for follow up. He has been undergoing rehab for his shoulder. He is on warfarin anticoagulation.  He is helping raise his 47-year-old granddaughter and has not had time to return to the swimming pool. He still notices that if he "walks in a big hurry" he develops some chest tightness consistent with exertional angina pectoris. This resolves promptly when he rests and does not occur during other types of exercise or daily activities. He denies shortness of breath, dizziness, palpitations, near-syncope or syncope. He has not had any bleeding problems on treatment with warfarin. His INR today is 2.5. He denies any focal neurological symptoms. He has kept a very detailed log of his blood pressure and heart rate. It seems that he has been in atrial fibrillation with average ventricular rates in the high 80s and low 90s for the last month. Occasionally his resting heart rate is over 100 bpm. His typical blood pressure is in the 110s-130s/80s, with the lowest documented blood pressure of 99/80.  In 2009, he received drug eluting Cypher stents to the LAD and the circumflex coronary arteries. Comorbidity includes systemic hypertension and hyperlipidemia both of which are adequately treated. By echocardiography in 2013 he has preserved left ventricular systolic function and no valvular abnormalities. He had a normal  stress nuclear myocardial scan in September of 2013.   Past Medical History  Diagnosis Date  . Hypercholesterolemia     takes Atorvastatin daily  . Gout   . BPH (benign prostatic hyperplasia)     takes Proscar daily  . CAD (coronary artery disease) 2009    cardiac cath and PTCA by Dr. Tami Cain  . Tubular adenoma 2001  . Diverticulosis 2007    tcs by Dr. Laural Golden  . GERD (gastroesophageal reflux disease)   . Adenomatous colon polyp 2001  . Osteoarthritis     left knee  . Complication of anesthesia     hard to wake up  . Hypertension     takes Imdur,Coreg,and Lisinopril daily  . Atrial fibrillation ([place])     takes Coumadin daily  . Pneumonia     many,many yrs ago  . Numbness     right hand pointer and middle finger  . Carpal tunnel syndrome     right  . Joint pain   . Joint swelling   . Nocturia   . Hypothyroidism     takes SYnthroid daily  . Anxiety     Past Surgical History  Procedure Laterality Date  . Hernia repair  2009    left inguinal  . Cataract extraction      bilateral  . Colonoscopy  2007    Dr. Laural Golden- L sided diverticulosis. Next TCS 2012 due to h/o tubular adenoma.  . Colonoscopy  02/26/2011    Procedure: COLONOSCOPY;  Surgeon: Jerry Dolin, MD;  Location: [facility];  Service: Endoscopy;  Laterality: N/A;  11:05  . Tee without cardioversion  12/12/2011    Procedure: TRANSESOPHAGEAL ECHOCARDIOGRAM (TEE);  Surgeon: Jerry Klein, MD;  Location: [facility] END[facility]COPY;  Service: Cardiovascular;  Laterality: N/A;   successful/sinus rhythm  . Cardioversion  12/12/2011    Procedure: CARDIOVERSION;  Surgeon: Jerry Klein, MD;  Location: [place];  Service: Cardiovascular;  Laterality: N/A;  . Bunionectomy Right 03/17/2013    Procedure: Lillard Anes, Arthroplasty 2nd toe right foot;  Surgeon: Jerry Cain, DPM;  Location: [facility];  Service: Orthopedics;  Laterality: Right;  . Aiken osteotomy Right 03/17/2013    Procedure: Barbie Banner OSTEOTOMY;  Surgeon:  Jerry Cain, DPM;  Location: [facility];  Service: Orthopedics;  Laterality: Right;  . Flexor tenotomy Left 03/17/2013    Procedure: PERCUTANEOUS FLEXOR TENOTOMY 3RD TOE LEFT FOOT;  Surgeon: Jerry Cain, DPM;  Location: [facility];  Service: Orthopedics;  Laterality: Left;  . Cardiac catheterization  06/11/2007    PTCA X2 in 06/2007:  2 overlapping Cypher stents to LAD (2.5x13 and 3.0x23)  . Cardiac catheterization  06/21/2007    Cypher stent 2.5 x 13  to OM branch  . Coronary angioplasty      x 3   . Total knee arthroplasty Left 06/14/2013    Procedure: TOTAL KNEE ARTHROPLASTY;  Surgeon: Jerry Balding, MD;  Location: [facility];  Service: Orthopedics;  Laterality: Left;  . Rot Left     Outpatient Prescriptions Prior to Visit  Medication Sig Dispense Refill  . alfuzosin (UROXATRAL) 10 MG 24 hr tablet Take 10 mg by mouth daily with breakfast.    . aspirin 81 MG tablet Take 81 mg by mouth every morning.     . Calcium Carbonate-Vitamin D (CALCIUM + D) 600-200 MG-UNIT TABS Take 1 tablet by mouth 2 (two) times daily.     . finasteride (PROSCAR) 5 MG tablet Take 5 mg by mouth every morning.     . Glucosamine 750 MG TABS Take 750 mg by mouth 2 (two) times daily.    . isosorbide mononitrate (IMDUR) 60 MG 24 hr tablet Take 1 tablet (60 mg total) by mouth daily. 30 tablet 11  . levothyroxine (SYNTHROID, LEVOTHROID) 125 MCG tablet Take 125 mcg by mouth daily before breakfast.     . lisinopril (PRINIVIL,ZESTRIL) 20 MG tablet TAKE ONE TABLET BY MOUTH AT BEDTIME (8PM). 30 tablet 11  . Multiple Vitamin (MULTIVITAMIN) capsule Take 1 capsule by mouth every morning.     . nitroGLYCERIN (NITROSTAT) 0.4 MG SL tablet Place 1 tablet (0.4 mg total) under the tongue every 5 (five) minutes as needed for chest pain. 25 tablet 3  . rosuvastatin (CRESTOR) 10 MG tablet TAKE (1) TABLET BY MOUTH ONCE A WEEK. 4 tablet 11  . traMADol (ULTRAM) 50 MG tablet Take 50 mg by mouth every 6 (six) hours as needed.       . warfarin (COUMADIN) 5 MG tablet TAKE 1 TABLET BY MOUTH DAILY OR AS DIRECTED BY COUMADIN CLINIC. 90 tablet 1  . carvedilol (COREG) 6.25 MG tablet TAKE (1) TABLET BY MOUTH TWICE DAILY (8AM AND 8PM). 60 tablet 11   No facility-administered medications prior to visit.     Allergies:   Carbapenems; Cephalosporins; and Penicillins   Social History   Social History  . Marital Status: Married    Spouse Name: N/A  . Number of Children: 4  . Years of Education: N/A   Occupational History  . retired  Social History Main Topics  . Smoking status: Never Smoker   . Smokeless tobacco: None  . Alcohol Use: No  . Drug Use: No  . Sexual Activity: Not Asked   Other Topics Concern  . None   Social History Narrative     Family History:  The patient's family history includes Heart disease in his brother; Pneumonia in his father. There is no history of Colon cancer.   ROS:   Please see the history of present illness.    ROS All other systems reviewed and are negative.   PHYSICAL EXAM:   VS:  BP 118/72 mmHg  Pulse 90  Ht 5\' 10"  (1.778 m)  Wt 92.625 kg (204 lb 3.2 oz)  BMI 29.30 kg/m2   GEN: Well nourished, well developed, in no acute distress HEENT: normal Neck: no JVD, carotid bruits, or masses Cardiac: irregular; widely split second heart sound, no murmurs, rubs, or gallops,no edema  Respiratory:  clear to auscultation bilaterally, normal work of breathing GI: soft, nontender, nondistended, + BS MS: no deformity or atrophy Skin: warm and dry, no rash Neuro:  Alert and Oriented x 3, Strength and sensation are intact Psych: euthymic mood, full affect  Wt Readings from Last 3 Encounters:  08/16/15 92.625 kg (204 lb 3.2 oz)  05/07/15 89.132 kg (196 lb 8 oz)  11/23/14 83.915 kg (185 lb)      Studies/Labs Reviewed:   EKG:  EKG is ordered today.  The ekg ordered today demonstrates sinus rhythm with premature atrial contractions, first-degree AV block, right bundle branch  block, left anterior fascicular block (trifascicular block)  Recent Labs: 11/23/2014: BUN 20; Creatinine, Ser 0.91; Hemoglobin 11.7*; Platelets 263; Potassium 3.8; Sodium 134*   Lipid Panel    Component Value Date/Time   CHOL  06/19/2007 0223    104        ATP III CLASSIFICATION:  <200     mg/dL   Desirable  200-239  mg/dL   Borderline High  >=240    mg/dL   High   TRIG 102 06/19/2007 0223   HDL 19* 06/19/2007 0223   CHOLHDL 5.5 06/19/2007 0223   VLDL 20 06/19/2007 0223   LDLCALC  06/19/2007 0223    65        Total Cholesterol/HDL:CHD Risk Coronary Heart Disease Risk Table                     Men   Women  1/2 Average Risk   3.4   3.3       ASSESSMENT:    1. Coronary artery disease involving native coronary artery of native heart with angina pectoris ([place])   2. Essential hypertension   3. Atrial fibrillation, unspecified type ([employer])   4. Long term (current) use of anticoagulants   5. Trifascicular block      PLAN:  In order of problems listed above:  1. CAD: He is describing CCS class II exertional angina pectoris. He continues to have symptoms of stable angina, rather than unstable angina. This may be related to poor ventricular rate control with activity. Pointed out that he needs to seek immediate attention if he develops unstable symptoms. We'll continue to titrate antianginal medications, with a plan for coronary angiography if we cannot control his symptoms 2. HTN: Good blood pressure control. If his blood pressure drops on the higher dose of carvedilol, consider switching to metoprolol 3. PAF: It seems he has been in persistent atrial fibrillation for the last  month, with borderline ventricular rate control. He is tolerating warfarin without problems. CHADSVasc score is 4 (age 29, HTN, CAD). He has never had a stroke or TIA. 4. Warfarin anticoagulation: He is also taking aspirin which increases the risk of bleeding, but I think this should be continued until we clarify  which way his anginal symptoms are going. 5. Trifascicular block: No signs or symptoms of high-grade AV block. Discussed symptoms of AV block that he should be aware of. We might need to consider a dual-chamber pacemaker if he develops bradycardia or evidence of high-grade AV block. Or the same reason I would like to avoid true antiarrhythmic drugs which would probably precipitate the need for pacemaker therapy.    Medication Adjustments/Labs and Tests Ordered: Current medicines are reviewed at length with the patient today.  Concerns regarding medicines are outlined above.  Medication changes, Labs and Tests ordered today are listed in the Patient Instructions below. Patient Instructions  Dr Sallyanne Kuster has recommended making the following medication changes: 1. INCREASE Carvedilol to 12.5 mg  Please call the office in 1 weeks time and speak with a nurse. Let them know if you have had any shortness of breath with exertion or chest tightness  Dr Sallyanne Kuster recommends that you schedule a follow-up appointment in 3 months.  If you need a refill on your cardiac medications before your next appointment, please call your pharmacy.  Mikael Spray, MD  08/16/2015 8:57 AM    [facility] [address] [place], [place], [place]  [address] Phone: [phone]; Fax: [phone]

## 2015-08-16 NOTE — Patient Instructions (Signed)
Dr Sallyanne Kuster has recommended making the following medication changes: 1. INCREASE Carvedilol to 12.5 mg  Please call the office in 1 weeks time and speak with a nurse. Let them know if you have had any shortness of breath with exertion or chest tightness  Dr Sallyanne Kuster recommends that you schedule a follow-up appointment in 3 months.  If you need a refill on your cardiac medications before your next appointment, please call your pharmacy.

## 2015-08-22 ENCOUNTER — Telehealth: Payer: Self-pay | Admitting: Cardiovascular Disease

## 2015-08-22 NOTE — Telephone Encounter (Signed)
Pt saw Dr Loletha Grayer Last Wednesday,he told him to call back this week and give an update on his condition.

## 2015-08-22 NOTE — Telephone Encounter (Signed)
Patient phoned in with update since starting new medication Stated his shortness of breath and elevated heart rate are much better, not really having any Blood pressures 112-130/90's HR 77-95 with some skipped beats Yesterday am blood pressure 98/74 HR 89-90 and today around 1:00 pm 98/74 HR 90, stated he felt good no issues Just wanted to give Dr Sallyanne Kuster an update Will forward for review

## 2015-08-22 NOTE — Telephone Encounter (Signed)
Thank you :)

## 2015-08-27 ENCOUNTER — Ambulatory Visit (INDEPENDENT_AMBULATORY_CARE_PROVIDER_SITE_OTHER): Payer: PPO | Admitting: *Deleted

## 2015-08-27 DIAGNOSIS — I4891 Unspecified atrial fibrillation: Secondary | ICD-10-CM

## 2015-08-27 DIAGNOSIS — Z7901 Long term (current) use of anticoagulants: Secondary | ICD-10-CM | POA: Diagnosis not present

## 2015-08-27 LAB — POCT INR: INR: 3.2

## 2015-08-28 DIAGNOSIS — E039 Hypothyroidism, unspecified: Secondary | ICD-10-CM | POA: Diagnosis not present

## 2015-08-28 DIAGNOSIS — I10 Essential (primary) hypertension: Secondary | ICD-10-CM | POA: Diagnosis not present

## 2015-08-28 DIAGNOSIS — B351 Tinea unguium: Secondary | ICD-10-CM | POA: Diagnosis not present

## 2015-08-28 DIAGNOSIS — I482 Chronic atrial fibrillation: Secondary | ICD-10-CM | POA: Diagnosis not present

## 2015-10-01 ENCOUNTER — Ambulatory Visit (INDEPENDENT_AMBULATORY_CARE_PROVIDER_SITE_OTHER): Payer: PPO | Admitting: *Deleted

## 2015-10-01 DIAGNOSIS — I4891 Unspecified atrial fibrillation: Secondary | ICD-10-CM

## 2015-10-01 DIAGNOSIS — Z7901 Long term (current) use of anticoagulants: Secondary | ICD-10-CM

## 2015-10-01 LAB — POCT INR: INR: 2.3

## 2015-10-18 DIAGNOSIS — B351 Tinea unguium: Secondary | ICD-10-CM | POA: Diagnosis not present

## 2015-10-18 DIAGNOSIS — M7542 Impingement syndrome of left shoulder: Secondary | ICD-10-CM | POA: Diagnosis not present

## 2015-10-31 ENCOUNTER — Encounter (HOSPITAL_COMMUNITY): Payer: Self-pay | Admitting: Specialist

## 2015-10-31 ENCOUNTER — Ambulatory Visit (HOSPITAL_COMMUNITY): Payer: PPO | Attending: *Deleted | Admitting: Specialist

## 2015-10-31 DIAGNOSIS — M25612 Stiffness of left shoulder, not elsewhere classified: Secondary | ICD-10-CM

## 2015-10-31 DIAGNOSIS — R29898 Other symptoms and signs involving the musculoskeletal system: Secondary | ICD-10-CM | POA: Diagnosis not present

## 2015-10-31 DIAGNOSIS — M25512 Pain in left shoulder: Secondary | ICD-10-CM | POA: Diagnosis not present

## 2015-10-31 NOTE — Patient Instructions (Signed)
Doorway Stretch  Place each hand opposite each other on the doorway. (You can change where you feel the stretch by moving arms higher or lower.) Step through with one foot and bend front knee until a stretch is felt and hold. Step through with the opposite foot on the next rep. Hold for ___15__ seconds. Repeat _3___times.     Scapular Retraction (Standing)   With arms at sides, pinch shoulder blades together. Repeat __10__ times per set. Do ____ sets per session. Do __3__ sessions per day.  http://orth.exer.us/944   Copyright  VHI. All rights reserved.   Internal Rotation Across Back  Grab the end of a towel with your affected side, palm facing backwards. Grab the towel with your unaffected side and pull your affected hand across your back until you feel a stretch in the front of your shoulder. If you feel pain, pull just to the pain, do not pull through the pain. Hold. Return your affected arm to your side. Try to keep your hand/arm close to your body during the entire movement.            Posterior Capsule Stretch   Stand or sit, one arm across body so hand rests over opposite shoulder. Gently push on crossed elbow with other hand until stretch is felt in shoulder of crossed arm. Hold _15__ seconds.  Repeat _3__ times per session. Do __3 sessions per day.   Wall Flexion  Using a towel, slide your arm up the wall until a stretch is felt in your shoulder .

## 2015-10-31 NOTE — Therapy (Signed)
[place], [place], [address] Phone: [phone]   Fax:  [phone]  Occupational Therapy Evaluation  Patient Details  Name: Jerry Cain MRN: [id number] Date of Birth: [birth year] Referring Provider: Dr. Octavio Graves, DO  Encounter Date: 10/31/2015      OT End of Session - 10/31/15 1407    Visit Number 1   Number of Visits 8   Date for OT Re-Evaluation 12/30/15   Authorization Type [employer]   Authorization Time Period before 10th visit   Authorization - Visit Number 1   Authorization - Number of Visits 10   OT Start Time 0855   OT Stop Time 0940   OT Time Calculation (min) 45 min   Activity Tolerance Patient tolerated treatment well   Behavior During Therapy [facility] for tasks assessed/performed      Past Medical History:  Diagnosis Date  . Adenomatous colon polyp 2001  . Anxiety   . Atrial fibrillation ([place])    takes Coumadin daily  . BPH (benign prostatic hyperplasia)    takes Proscar daily  . CAD (coronary artery disease) 2009   cardiac cath and PTCA by Dr. Tami Ribas  . Carpal tunnel syndrome    right  . Complication of anesthesia    hard to wake up  . Diverticulosis 2007   tcs by Dr. Laural Golden  . GERD (gastroesophageal reflux disease)   . Gout   . Hypercholesterolemia    takes Atorvastatin daily  . Hypertension    takes Imdur,Coreg,and Lisinopril daily  . Hypothyroidism    takes SYnthroid daily  . Joint pain   . Joint swelling   . Nocturia   . Numbness    right hand pointer and middle finger  . Osteoarthritis    left knee  . Pneumonia    many,many yrs ago  . Tubular adenoma 2001    Past Surgical History:  Procedure Laterality Date  . Barbie Banner OSTEOTOMY Right 03/17/2013   Procedure: Barbie Banner OSTEOTOMY;  Surgeon: Marcheta Grammes, DPM;  [facility]: [facility];  Service: Orthopedics;  Laterality: Right;  . BUNIONECTOMY Right 03/17/2013   Procedure: BUNIONECTOMY,  Arthroplasty 2nd toe right foot;  Surgeon: Marcheta Grammes, DPM;  Location: [facility];  Service: Orthopedics;  Laterality: Right;  . CARDIAC CATHETERIZATION  06/11/2007   PTCA X2 in 06/2007:  2 overlapping Cypher stents to LAD (2.5x13 and 3.0x23)  . CARDIAC CATHETERIZATION  06/21/2007   Cypher stent 2.5 x 13  to OM branch  . CARDIOVERSION  12/12/2011   Procedure: CARDIOVERSION;  Surgeon: Sanda Klein, MD;  [facility];  Service: Cardiovascular;  Laterality: N/A;  . CATARACT EXTRACTION     bilateral  . COLONOSCOPY  2007   Dr. Laural Golden- L sided diverticulosis. Next TCS 2012 due to h/o tubular adenoma.  . COLONOSCOPY  02/26/2011   Procedure: COLONOSCOPY;  Surgeon: Daneil Dolin, MD;  [facility];  Service: Endoscopy;  Laterality: N/A;  11:05  . CORONARY ANGIOPLASTY     x 3   . FLEXOR TENOTOMY Left 03/17/2013   Procedure: PERCUTANEOUS FLEXOR TENOTOMY 3RD TOE LEFT FOOT;  Surgeon: Marcheta Grammes, DPM;  Location: [facility];  Service: Orthopedics;  Laterality: Left;  . HERNIA REPAIR  2009   left inguinal  . rot Left   . TEE WITHOUT CARDIOVERSION  12/12/2011   Procedure: TRANSESOPHAGEAL ECHOCARDIOGRAM (TEE);  Surgeon: Sanda Klein, MD;  Location: [place];  Service: Cardiovascular;  Laterality: N/A;  successful/sinus rhythm  . TOTAL KNEE ARTHROPLASTY Left 06/14/2013   Procedure: TOTAL KNEE ARTHROPLASTY;  Surgeon: Garald Balding, MD;  Location: [place];  Service: Orthopedics;  Laterality: Left;    There were no vitals filed for this visit.      Subjective Assessment - 10/31/15 1337    Subjective  S:  I have been having trouble drying my back and reaching to hang up my towel for the last few weeks.   Pertinent History [name] had left rotator cuff repair surgery in August 2016.  He successfully completed rehab and was using his left arm at 100% until a few months ago.  He has began to notice increased pain and decreased mobility in his left shoulder.   He consulted with his his primary care physician and has been diagnosed with rotator cuff impingement syndrome and referred to occupational therapy for evaluation and treatment.     Currently in Pain? Yes   Pain Score 4    Pain Location Shoulder   Pain Orientation Left   Pain Descriptors / Indicators Aching   Pain Type Acute pain   Pain Onset More than a month ago   Aggravating Factors  use, reaching above shoulder height   Pain Relieving Factors rest, heat   Effect of Pain on Daily Activities unable to use left arm fully with functional activities           [facility] OT Assessment - 10/31/15 0001      Assessment   Diagnosis Left Rotator Cuff Impingement Syndrome   Referring Provider Dr. Octavio Graves, DO   Onset Date 09/06/15  approximate   Prior Therapy occupational therapy s/p left rotator cuff repair in August 2016     Precautions   Precautions None     Restrictions   Weight Bearing Restrictions No     Balance Screen   Has the patient fallen in the past 6 months No   Has the patient had a decrease in activity level because of a fear of falling?  No   Is the patient reluctant to leave their home because of a fear of falling?  No     Home  Environment   Family/patient expects to be discharged to: Private residence   Living Arrangements Spouse/significant other   Lives With Jerry Cain  driving   Vocation Retired   [employer] part time   Leisure very active with his garden, skeet shooting, swimming, and spending time with his family     ADL   ADL comments unable to use his left arm with activities overhead, behind his head, or lifting anything heavy.  patient has difficulty getting comfortble to go to sleep at night.      Written Expression   Dominant Hand Right     Vision - History   Baseline Vision Wears glasses all the time     Cognition   Overall Cognitive Status Within Functional  Limits for tasks assessed     Observation/Other Assessments   Focus on Therapeutic Outcomes (FOTO)  complete next visit     Sensation   Light Touch Appears Intact     Coordination   Gross Motor Movements are Fluid and Coordinated Yes   Fine Motor Movements are Fluid and Coordinated Yes     ROM / Strength   AROM / PROM / Strength AROM;PROM;Strength     Palpation   Palpation comment mod-max  fascial restrictions noted in left shoulder and scapular region      AROM   Overall AROM Comments assessed in seated, External rotation and internal rotation with shoulder adducted   AROM Assessment Site Shoulder   Right/Left Shoulder Left   Left Shoulder Flexion 100 Degrees   Left Shoulder ABduction 100 Degrees   Left Shoulder Internal Rotation 84 Degrees   Left Shoulder External Rotation 50 Degrees     PROM   PROM Assessment Site Shoulder   Right/Left Shoulder Left   Left Shoulder Flexion 130 Degrees   Left Shoulder ABduction 115 Degrees   Left Shoulder Internal Rotation 84 Degrees   Left Shoulder External Rotation 60 Degrees     Strength   Strength Assessment Site Shoulder   Right/Left Shoulder Left   Left Shoulder Flexion 4/5   Left Shoulder ABduction 4/5   Left Shoulder Internal Rotation 4/5   Left Shoulder External Rotation 4/5                  OT Treatments/Exercises (OP) - 10/31/15 0001      Exercises   Exercises Shoulder     Shoulder Exercises: Supine   Protraction PROM;5 reps   Horizontal ABduction PROM;5 reps   External Rotation PROM;5 reps   Internal Rotation PROM;5 reps   Flexion PROM;5 reps   ABduction PROM;5 reps     Shoulder Exercises: Stretch   Cross Chest Stretch 1 rep;10 seconds   External Rotation Stretch 1 rep;10 seconds   Wall Stretch - Flexion 1 rep;10 seconds     Manual Therapy   Manual Therapy Myofascial release   Manual therapy comments Manual therapy completed prior to ROM measurements and treatment exercises.   Myofascial Release  Myofascial release and manual stretching to left upper arm, shoulder, scapular, and cervical regions to decrease restrictions and improve pain free mobility               OT Education - 10/31/15 1406    Education provided Yes   Education Details Patient was educated and completed shoulder stretches    Person(s) Educated Patient   Methods Explanation;Demonstration;Handout   Comprehension Verbalized understanding;Returned demonstration          OT Short Term Goals - 10/31/15 1412      OT SHORT TERM GOAL #1   Title Patient will be education and independent with HEP for improved functional use of left arm.   Time 4   Period Weeks   Status New     OT SHORT TERM GOAL #2   Title Patient will increase left shoulder P/ROM to [facility] to increase ability to dry his back and hang up his towel.   Time 4   Period Weeks   Status New     OT SHORT TERM GOAL #3   Title Patient will increase left shoulder strength to 4+/5 to increase ability to pick up and stabalize household items.   Time 4   Period Weeks   Status New     OT SHORT TERM GOAL #4   Title Patient will decrease pain in his left shoulder region to 3/10 or better when sleeping at night.    Time 4   Period Weeks   Status New     OT SHORT TERM GOAL #5   Title Patient will decrease left shoulder fascial restrictions to min-mod for improved mobility needed to reach overhead.   Period Weeks   Status New  OT Long Term Goals - 10/31/15 1420      OT LONG TERM GOAL #1   Title Patient will return to highest level of independence with all daily tasks using LUE.   Time 8   Period Weeks   Status New     OT LONG TERM GOAL #2   Title Patient will increase AROM in left shoulder  to WNL to increase ability to hang up his towel, reach overhead, and complete gardening and canning activities.    Time 8   Period Weeks   Status New     OT LONG TERM GOAL #3   Title Patient will increase left shoulder strength to 5/5 for  improved abilty to lift items onto overhead shelves and pick up gardening supplies.    Time 8   Period Weeks   Status New     OT LONG TERM GOAL #4   Title Patient will decrease fascial restrictions to trace amount in his left shoulder region in order to improve mobility needed to complete daily tasks.    Time 8   Period Weeks   Status New     OT LONG TERM GOAL #5   Title Patient will decrease pain level to 1/10 in his left shoulder  when completing daily tasks.   Time 8   Period Weeks   Status New               Plan - 10/31/15 1408    Clinical Impression Statement A:  Patient is an 82 year old male wit past medical history that includes left TKA, left CTR, hypertension, high cholesterol, multiple surgeries, heart trouble, and left rotator cuff repair in August 2016.  He began experiencing increased pain in his left shoulder a few months ago, and has also noticed a decrease in mobility and functional use of his left arm.  Patient was referred to occupational therapy for evaluation and treatment.     Rehab Potential Good   OT Frequency 1x / week   OT Duration 8 weeks   OT Treatment/Interventions Self-care/ADL training;Cryotherapy;Electrical Stimulation;Moist Heat;Iontophoresis;Ultrasound;Therapeutic exercise;Neuromuscular education;Energy conservation;DME and/or AE instruction;Passive range of motion;Manual Therapy;Therapeutic exercises;Therapeutic activities;Patient/family education   Plan P:  Skilled OT intervention to decrease pain and restrictions and improve pain free mobility in his left shoulder needed to use left arm fully with all functional activities.  Next visit:  review HEP and treatment plan, Manual therapy with P/AAROM, add AA/ROM to HEP, update HEP weekly, progress as tolerated.    Consulted and Agree with Plan of Care Patient      Patient will benefit from skilled therapeutic intervention in order to improve the following deficits and impairments:  Decreased range  of motion, Decreased strength, Increased fascial restricitons, Impaired UE functional use, Increased muscle spasms, Pain  Visit Diagnosis: Pain in left shoulder - Plan: Ot plan of care cert/re-cert  Stiffness of left shoulder, not elsewhere classified - Plan: Ot plan of care cert/re-cert  Other symptoms and signs involving the musculoskeletal system - Plan: Ot plan of care cert/re-cert      G-Codes - [id number]    Functional Assessment Tool Used clinical judgement based on A/ROM and strength, FOTO will be completed next visit.   Functional Limitation Carrying, moving and handling objects   Carrying, Moving and Handling Objects Current Status [phone]) At least 40 percent but less than 60 percent impaired, limited or restricted   Carrying, Moving and Handling Objects Goal Status [id number]) At least 1 percent but less  than 20 percent impaired, limited or restricted      Problem List Patient Active Problem List   Diagnosis Date Noted  . 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 Arthritis of knee, left 06/16/2013  . S/P total knee replacement using cement 06/14/2013  . Osteoarthritis of left knee 03/21/2013  . BPH (benign prostatic hyperplasia)   . GERD (gastroesophageal reflux disease)   . CAD (coronary artery disease)   . Fever 03/20/2013  . Altered mental state 03/20/2013  . Postoperative fever 03/20/2013  . PNA (pneumonia) 03/20/2013  . Preop cardiovascular exam 11/24/2012  . Peroneal tendonitis 10/05/2012  . Arthritis of foot, degenerative 10/05/2012  . HTN (hypertension) 09/25/2012  . Hyperlipidemia 09/25/2012  . Atrial fibrillation ([place]) 06/15/2012  . Long term (current) use of anticoagulants 06/15/2012  . Rotator cuff strain 02/04/2012  . Shoulder pain 02/04/2012  . Personal history of colonic polyps 01/29/2011  . High risk medication use 01/29/2011    Vangie Bicker, [place], OTR/L [phone]  10/31/2015, 2:27 PM  [place], [place], [address] Phone: [phone]   Fax:  [phone]  Name: JAKAI KIRKEBY MRN: [id number] Date of Birth: [birth year]

## 2015-11-05 ENCOUNTER — Ambulatory Visit (INDEPENDENT_AMBULATORY_CARE_PROVIDER_SITE_OTHER): Payer: PPO | Admitting: *Deleted

## 2015-11-05 DIAGNOSIS — I4891 Unspecified atrial fibrillation: Secondary | ICD-10-CM | POA: Diagnosis not present

## 2015-11-05 DIAGNOSIS — Z7901 Long term (current) use of anticoagulants: Secondary | ICD-10-CM | POA: Diagnosis not present

## 2015-11-05 LAB — POCT INR: INR: 4

## 2015-11-07 ENCOUNTER — Ambulatory Visit (HOSPITAL_COMMUNITY): Payer: PPO | Attending: *Deleted | Admitting: Specialist

## 2015-11-07 DIAGNOSIS — R29898 Other symptoms and signs involving the musculoskeletal system: Secondary | ICD-10-CM | POA: Insufficient documentation

## 2015-11-07 DIAGNOSIS — M25512 Pain in left shoulder: Secondary | ICD-10-CM | POA: Insufficient documentation

## 2015-11-07 DIAGNOSIS — M25612 Stiffness of left shoulder, not elsewhere classified: Secondary | ICD-10-CM

## 2015-11-07 NOTE — Therapy (Signed)
[place], [place], [address] Phone: [phone]   Fax:  [phone]  Occupational Therapy Treatment  Patient Details  Name: Jerry Cain MRN: [id number] Date of Birth: Jun 28, 1931 Referring Provider: Dr. Octavio Graves, DO  Encounter Date: 11/07/2015      OT End of Session - 11/07/15 1407    Visit Number 2   Number of Visits 8   Date for OT Re-Evaluation 12/30/15   Authorization Type [employer]   Authorization Time Period before 10th visit   Authorization - Visit Number 2   Authorization - Number of Visits 10   OT Start Time 1017   OT Stop Time 1345   OT Time Calculation (min) 40 min   Activity Tolerance Patient tolerated treatment well   Behavior During Therapy [facility] for tasks assessed/performed      Past Medical History:  Diagnosis Date  . Adenomatous colon polyp 2001  . Anxiety   . Atrial fibrillation ([place])    takes Coumadin daily  . BPH (benign prostatic hyperplasia)    takes Proscar daily  . CAD (coronary artery disease) 2009   cardiac cath and PTCA by Dr. Tami Ribas  . Carpal tunnel syndrome    right  . Complication of anesthesia    hard to wake up  . Diverticulosis 2007   tcs by Dr. Laural Golden  . GERD (gastroesophageal reflux disease)   . Gout   . Hypercholesterolemia    takes Atorvastatin daily  . Hypertension    takes Imdur,Coreg,and Lisinopril daily  . Hypothyroidism    takes SYnthroid daily  . Joint pain   . Joint swelling   . Nocturia   . Numbness    right hand pointer and middle finger  . Osteoarthritis    left knee  . Pneumonia    many,many yrs ago  . Tubular adenoma 2001    Past Surgical History:  Procedure Laterality Date  . Barbie Banner OSTEOTOMY Right 03/17/2013   Procedure: Barbie Banner OSTEOTOMY;  Surgeon: Marcheta Grammes, DPM;  [facility];  Service: Orthopedics;  Laterality: Right;  . BUNIONECTOMY Right 03/17/2013   Procedure: BUNIONECTOMY, Arthroplasty  2nd toe right foot;  Surgeon: Marcheta Grammes, DPM;  Location: [facility];  Service: Orthopedics;  Laterality: Right;  . CARDIAC CATHETERIZATION  06/11/2007   PTCA X2 in 06/2007:  2 overlapping Cypher stents to LAD (2.5x13 and 3.0x23)  . CARDIAC CATHETERIZATION  06/21/2007   Cypher stent 2.5 x 13  to OM branch  . CARDIOVERSION  12/12/2011   Procedure: CARDIOVERSION;  Surgeon: Sanda Klein, MD;  [facility];  Service: Cardiovascular;  Laterality: N/A;  . CATARACT EXTRACTION     bilateral  . COLONOSCOPY  2007   Dr. Laural Golden- L sided diverticulosis. Next TCS 2012 due to h/o tubular adenoma.  . COLONOSCOPY  02/26/2011   Procedure: COLONOSCOPY;  Surgeon: Daneil Dolin, MD;  [facility];  Service: Endoscopy;  Laterality: N/A;  11:05  . CORONARY ANGIOPLASTY     x 3   . FLEXOR TENOTOMY Left 03/17/2013   Procedure: PERCUTANEOUS FLEXOR TENOTOMY 3RD TOE LEFT FOOT;  Surgeon: Marcheta Grammes, DPM;  Location: [facility];  Service: Orthopedics;  Laterality: Left;  . HERNIA REPAIR  2009   left inguinal  . rot Left   . TEE WITHOUT CARDIOVERSION  12/12/2011   Procedure: TRANSESOPHAGEAL ECHOCARDIOGRAM (TEE);  Surgeon: Sanda Klein, MD;  Location: [place];  Service: Cardiovascular;  Laterality: N/A;  successful/sinus rhythm  . TOTAL KNEE ARTHROPLASTY Left 06/14/2013   Procedure: TOTAL KNEE ARTHROPLASTY;  Surgeon: Garald Balding, MD;  Location: [place];  Service: Orthopedics;  Laterality: Left;    There were no vitals filed for this visit.      Subjective Assessment - 11/07/15 1403    Subjective  S:  My arm has been really sore since last week.    Currently in Pain? Yes   Pain Score 5    Pain Location Shoulder   Pain Orientation Left   Pain Descriptors / Indicators Aching   Pain Type Acute pain   Pain Onset More than a month ago   Pain Frequency Intermittent   Aggravating Factors  use and reaching above shoulder height   Pain Relieving Factors rest, heat    Effect of Pain on Daily Activities unable to use left arm above shoulder height.             [facility] OT Assessment - 11/07/15 0001      Assessment   Diagnosis Left Rotator Cuff Impingement Syndrome     Precautions   Precautions None                  OT Treatments/Exercises (OP) - 11/07/15 0001      Exercises   Exercises Shoulder     Shoulder Exercises: Supine   Protraction PROM;5 reps;AAROM;10 reps   Horizontal ABduction PROM;5 reps;AAROM;10 reps   External Rotation PROM;5 reps;AAROM;10 reps   Internal Rotation AAROM;10 reps   Flexion PROM;AAROM;10 reps   ABduction PROM;5 reps;AAROM;10 reps     Shoulder Exercises: Standing   External Rotation Theraband;10 reps   Theraband Level (Shoulder External Rotation) Level 2 (Red)   Internal Rotation Theraband;10 reps   Theraband Level (Shoulder Internal Rotation) Level 2 (Red)   Extension Theraband;12 reps   Theraband Level (Shoulder Extension) Level 2 (Red)   Row Theraband;10 reps   Theraband Level (Shoulder Row) Level 2 (Red)   Retraction Theraband;10 reps   Theraband Level (Shoulder Retraction) Level 2 (Red)     Shoulder Exercises: Therapy Ball   Flexion 15 reps   ABduction 15 reps   Right/Left 5 reps;Limitations   Right/Left Limitations minimal facilitation with ball going left to right     Manual Therapy   Manual Therapy Myofascial release   Manual therapy comments Manual therapy completed prior to ROM measurements and treatment exercises.   Myofascial Release Myofascial release and manual stretching to left upper arm, shoulder, scapular, and cervical regions to decrease restrictions and improve pain free mobility                  OT Short Term Goals - 11/07/15 1409      OT SHORT TERM GOAL #1   Title Patient will be education and independent with HEP for improved functional use of left arm.   Time 4   Period Weeks   Status On-going     OT SHORT TERM GOAL #2   Title Patient will increase  left shoulder P/ROM to [facility] to increase ability to dry his back and hang up his towel.   Time 4   Period Weeks   Status On-going     OT SHORT TERM GOAL #3   Title Patient will increase left shoulder strength to 4+/5 to increase ability to pick up and stabalize household items.   Time 4   Period Weeks   Status On-going     OT SHORT TERM GOAL #4  Title Patient will decrease pain in his left shoulder region to 3/10 or better when sleeping at night.    Time 4   Period Weeks   Status On-going     OT SHORT TERM GOAL #5   Title Patient will decrease left shoulder fascial restrictions to min-mod for improved mobility needed to reach overhead.   Time 4   Period Weeks   Status New           OT Long Term Goals - 11/07/15 1410      OT LONG TERM GOAL #1   Title Patient will return to highest level of independence with all daily tasks using LUE.   Time 8   Period Weeks   Status On-going     OT LONG TERM GOAL #2   Title Patient will increase AROM in left shoulder  to WNL to increase ability to hang up his towel, reach overhead, and complete gardening and canning activities.    Time 8   Period Weeks   Status On-going     OT LONG TERM GOAL #3   Title Patient will increase left shoulder strength to 5/5 for improved abilty to lift items onto overhead shelves and pick up gardening supplies.    Time 8   Period Weeks   Status On-going     OT LONG TERM GOAL #4   Title Patient will decrease fascial restrictions to trace amount in his left shoulder region in order to improve mobility needed to complete daily tasks.    Time 8   Period Weeks   Status On-going     OT LONG TERM GOAL #5   Title Patient will decrease pain level to 1/10 in his left shoulder  when completing daily tasks.   Time 8   Period Weeks   Status On-going               Plan - 11/07/15 1408    Clinical Impression Statement A:  Patient continues to have significant pain with P/ROM or AA/ROM greater than  shoulder height.  Updated HEP with AA/ROM in supine for improved range in pain free range.    Plan P:  improve P/ROM by 25%, add wall wash, prot/ret/elev/dep and thumbtacks for greater scapular stability. Update HEP weekly.      Patient will benefit from skilled therapeutic intervention in order to improve the following deficits and impairments:  Decreased range of motion, Decreased strength, Increased fascial restricitons, Impaired UE functional use, Increased muscle spasms, Pain  Visit Diagnosis: Pain in left shoulder  Stiffness of left shoulder, not elsewhere classified  Other symptoms and signs involving the musculoskeletal system    Problem List Patient Active Problem List   Diagnosis Date Noted  . 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 Arthritis of knee, left 06/16/2013  . S/P total knee replacement using cement 06/14/2013  . Osteoarthritis of left knee 03/21/2013  . BPH (benign prostatic hyperplasia)   . GERD (gastroesophageal reflux disease)   . CAD (coronary artery disease)   . Fever 03/20/2013  . Altered mental state 03/20/2013  . Postoperative fever 03/20/2013  . PNA (pneumonia) 03/20/2013  . Preop cardiovascular exam 11/24/2012  . Peroneal tendonitis 10/05/2012  . Arthritis of foot, degenerative 10/05/2012  . HTN (hypertension) 09/25/2012  . Hyperlipidemia 09/25/2012  . Atrial fibrillation ([place]) 06/15/2012  . Long term (current) use of anticoagulants 06/15/2012  . Rotator cuff strain 02/04/2012  . Shoulder pain 02/04/2012  . Personal history of colonic polyps 01/29/2011  .  High risk medication use 01/29/2011    Vangie Bicker, [place], OTR/L [phone]  11/07/2015, 2:11 PM  [place], [place], [address] Phone: [phone]   Fax:  [phone]  Name: Jerry Cain MRN: [id number] Date of Birth: [birth year]

## 2015-11-07 NOTE — Patient Instructions (Signed)
Perform each exercise ____10____ reps. 2-3x days.   Protraction - STANDING  Start by holding a wand or cane at chest height.  Next, slowly push the wand outwards in front of your body so that your elbows become fully straightened. Then, return to the original position.     Shoulder FLEXION - STANDING - PALMS UP  In the standing position, hold a wand/cane with both arms, palms up on both sides. Raise up the wand/cane allowing your unaffected arm to perform most of the effort. Your affected arm should be partially relaxed.      Internal/External ROTATION - STANDING  In the standing position, hold a wand/cane with both hands keeping your elbows bent. Move your arms and wand/cane to one side.  Your affected arm should be partially relaxed while your unaffected arm performs most of the effort.       Shoulder ABDUCTION - STANDING  While holding a wand/cane palm face up on the injured side and palm face down on the uninjured side, slowly raise up your injured arm to the side.                     Horizontal Abduction/Adduction      Straight arms holding cane at shoulder height, bring cane to right, center, left. Repeat starting to left.   Copyright  V[employer]. All rights reserved.       

## 2015-11-14 ENCOUNTER — Ambulatory Visit (HOSPITAL_COMMUNITY): Payer: PPO

## 2015-11-15 ENCOUNTER — Telehealth (HOSPITAL_COMMUNITY): Payer: Self-pay

## 2015-11-15 NOTE — Telephone Encounter (Signed)
Called patient regarding no show on 11/14/15. Left message on voicemail and reminded patient of his next scheduled therapy appointment and to call if he is unable to make it.   Ailene Ravel, OTR/L,CBIS  [phone]

## 2015-11-19 ENCOUNTER — Ambulatory Visit (INDEPENDENT_AMBULATORY_CARE_PROVIDER_SITE_OTHER): Payer: PPO | Admitting: Cardiovascular Disease

## 2015-11-19 ENCOUNTER — Encounter: Payer: Self-pay | Admitting: Cardiovascular Disease

## 2015-11-19 VITALS — BP 142/76 | HR 90 | Ht 71.0 in | Wt 202.0 lb

## 2015-11-19 DIAGNOSIS — R0602 Shortness of breath: Secondary | ICD-10-CM | POA: Diagnosis not present

## 2015-11-19 MED ORDER — CARVEDILOL 25 MG PO TABS: 25.0000 mg | ORAL_TABLET | Freq: Two times a day (BID) | ORAL | 11 refills | Status: DC

## 2015-11-19 NOTE — Patient Instructions (Signed)
Medication Instructions: Dr Sallyanne Kuster has recommended making the following medication changes: 1. INCREASE Carvedilol to 25 mg twice daily  Labwork: NONE ORDERED  Testing/Procedures: 1. Echocardiogram - Your physician has requested that you have an echocardiogram. Echocardiography is a painless test that uses sound waves to create images of your heart. It provides your doctor with information about the size and shape of your heart and how well your heart's chambers and valves are working. This procedure takes approximately one hour. There are no restrictions for this procedure. This has been ordered to be performed at [facility]  Follow-up: Dr Sallyanne Kuster recommends that you schedule a follow-up appointment first available.  If you need a refill on your cardiac medications before your next appointment, please call your pharmacy.

## 2015-11-19 NOTE — Progress Notes (Signed)
[name], male   DOB: Jan 07, 1932, 82 y.o.   MRN: [id number] [name], male   DOB: [birth year], 82 y.o.   MRN: [id number]    Cardiology Office Note    Date:  11/20/2015   ID:  Jerry Cain, DOB 1931-08-12, MRN [id number]  PCP:  Octavio Graves, DO  Cardiologist:   Sanda Klein, MD   No chief complaint on file.   History of Present Illness:  Jerry Cain is a 82 y.o. male with CAD and paroxysmal atrial fibrillation, here for follow up. He is on warfarin anticoagulation.  His shoulder problems have been improving with physical therapy but he tripped over some wiring in his vegetable garden and fell last weekend. He now has discomfort in his shoulder and elbow again. She has not been swimming anymore due to the joint pain. He denies any injury to his head after his fall. He has not had any serious bleeding problems. He is walking just under 1 mile in about 20 minutes on a virtually daily basis. He denies angina pectoris, exertional dyspnea, palpitations or syncope. He has mild to moderate ankle edema, was worse when he was eating better on a regular basis. He denies any focal neurological symptoms. He keeps a very detailed log of his blood pressure and heart rate. His typical blood pressure is in the 130s/70-80s, typically with heart rates in the 90s-low 100s.  In 2009, he received drug eluting Cypher stents to the LAD and the circumflex coronary arteries. Comorbidity includes systemic hypertension and hyperlipidemia both of which are adequately treated. By echocardiography in 2013 he has preserved left ventricular systolic function and no valvular abnormalities. He had a normal stress nuclear myocardial scan in September of 2013.   Past Medical History:  Diagnosis Date  . Adenomatous colon polyp 2001  . Anxiety   . Atrial fibrillation ([place])    takes Coumadin daily  . BPH (benign prostatic hyperplasia)    takes Proscar daily  . CAD  (coronary artery disease) 2009   cardiac cath and PTCA by Dr. Tami Ribas  . Carpal tunnel syndrome    right  . Complication of anesthesia    hard to wake up  . Diverticulosis 2007   tcs by Dr. Laural Golden  . GERD (gastroesophageal reflux disease)   . Gout   . Hypercholesterolemia    takes Atorvastatin daily  . Hypertension    takes Imdur,Coreg,and Lisinopril daily  . Hypothyroidism    takes SYnthroid daily  . Joint pain   . Joint swelling   . Nocturia   . Numbness    right hand pointer and middle finger  . Osteoarthritis    left knee  . Pneumonia    many,many yrs ago  . Tubular adenoma 2001    Past Surgical History:  Procedure Laterality Date  . Barbie Banner OSTEOTOMY Right 03/17/2013   Procedure: Barbie Banner OSTEOTOMY;  Surgeon: Marcheta Grammes, DPM;  [facility]: [facility];  Service: Orthopedics;  Laterality: Right;  . BUNIONECTOMY Right 03/17/2013   Procedure: BUNIONECTOMY, Arthroplasty 2nd toe right foot;  Surgeon: Marcheta Grammes, DPM;  Location: [facility];  Service: Orthopedics;  Laterality: Right;  . CARDIAC CATHETERIZATION  06/11/2007   PTCA X2 in 06/2007:  2 overlapping Cypher stents to LAD (2.5x13 and 3.0x23)  . CARDIAC CATHETERIZATION  06/21/2007   Cypher stent 2.5 x 13  to OM branch  . CARDIOVERSION  12/12/2011   Procedure: CARDIOVERSION;  Surgeon: Sanda Klein, MD;  [facility];  Service: Cardiovascular;  Laterality: N/A;  . CATARACT EXTRACTION     bilateral  . COLONOSCOPY  2007   Dr. Laural Golden- L sided diverticulosis. Next TCS 2012 due to h/o tubular adenoma.  . COLONOSCOPY  02/26/2011   Procedure: COLONOSCOPY;  Surgeon: Daneil Dolin, MD;  [facility];  Service: Endoscopy;  Laterality: N/A;  11:05  . CORONARY ANGIOPLASTY     x 3   . FLEXOR TENOTOMY Left 03/17/2013   Procedure: PERCUTANEOUS FLEXOR TENOTOMY 3RD TOE LEFT FOOT;  Surgeon: Marcheta Grammes, DPM;  Location: [facility];  Service: Orthopedics;  Laterality: Left;  . HERNIA REPAIR  2009    left inguinal  . rot Left   . TEE WITHOUT CARDIOVERSION  12/12/2011   Procedure: TRANSESOPHAGEAL ECHOCARDIOGRAM (TEE);  Surgeon: Sanda Klein, MD;  Location: [facility];  Service: Cardiovascular;  Laterality: N/A;   successful/sinus rhythm  . TOTAL KNEE ARTHROPLASTY Left 06/14/2013   Procedure: TOTAL KNEE ARTHROPLASTY;  Surgeon: Garald Balding, MD;  Location: [place];  Service: Orthopedics;  Laterality: Left;    Outpatient Medications Prior to Visit  Medication Sig Dispense Refill  . alfuzosin (UROXATRAL) 10 MG 24 hr tablet Take 10 mg by mouth daily with breakfast.    . aspirin 81 MG tablet Take 81 mg by mouth every morning.     . Calcium Carbonate-Vitamin D (CALCIUM + D) 600-200 MG-UNIT TABS Take 1 tablet by mouth 2 (two) times daily.     . finasteride (PROSCAR) 5 MG tablet Take 5 mg by mouth every morning.     . Glucosamine 750 MG TABS Take 750 mg by mouth 2 (two) times daily.    . isosorbide mononitrate (IMDUR) 60 MG 24 hr tablet Take 1 tablet (60 mg total) by mouth daily. 30 tablet 11  . levothyroxine (SYNTHROID, LEVOTHROID) 125 MCG tablet Take 125 mcg by mouth daily before breakfast.     . lisinopril (PRINIVIL,ZESTRIL) 20 MG tablet TAKE ONE TABLET BY MOUTH AT BEDTIME (8PM). 30 tablet 11  . Multiple Vitamin (MULTIVITAMIN) capsule Take 1 capsule by mouth every morning.     . nitroGLYCERIN (NITROSTAT) 0.4 MG SL tablet Place 1 tablet (0.4 mg total) under the tongue every 5 (five) minutes as needed for chest pain. 25 tablet 3  . rosuvastatin (CRESTOR) 10 MG tablet TAKE (1) TABLET BY MOUTH ONCE A WEEK. 4 tablet 11  . traMADol (ULTRAM) 50 MG tablet Take 50 mg by mouth every 6 (six) hours as needed.     . warfarin (COUMADIN) 5 MG tablet TAKE 1 TABLET BY MOUTH DAILY OR AS DIRECTED BY COUMADIN CLINIC. 90 tablet 1  . carvedilol (COREG) 12.5 MG tablet Take 1 tablet (12.5 mg total) by mouth 2 (two) times daily with a meal. 60 tablet 11   No facility-administered medications prior to visit.       Allergies:   Carbapenems; Cephalosporins; and Penicillins   Social History   Social History  . Marital status: Married    Spouse name: N/A  . Number of children: 4  . Years of education: N/A   Occupational History  . retired    Social History Main Topics  . Smoking status: Never Smoker  . Smokeless tobacco: Never Used  . Alcohol use No  . Drug use: No  . Sexual activity: Not Asked   Other Topics Concern  . None   Social History Narrative  . None     Family History:  The patient's family  history includes Heart disease in his brother; Pneumonia in his father.   ROS:   Please see the history of present illness.    ROS All other systems reviewed and are negative.   PHYSICAL EXAM:   VS:  BP (!) 142/76   Pulse 90   Ht 5\' 11"  (1.803 m)   Wt 202 lb (91.6 kg)   BMI 28.17 kg/m    GEN: Well nourished, well developed, in no acute distress  HEENT: normal  Neck: no JVD, carotid bruits, or masses Cardiac: irregular rhythm; widely split second heart sound, no murmurs, rubs, or gallops, 1-2 plus symmetrical ankle and pedal edema  Respiratory:  clear to auscultation bilaterally, normal work of breathing GI: soft, nontender, nondistended, + BS MS: no deformity or atrophy  Skin: warm and dry, no rash Neuro:  Alert and Oriented x 3, Strength and sensation are intact Psych: euthymic mood, full affect  Wt Readings from Last 3 Encounters:  11/19/15 202 lb (91.6 kg)  08/16/15 204 lb 3.2 oz (92.6 kg)  05/07/15 196 lb 8 oz (89.1 kg)      Studies/Labs Reviewed:   EKG:  EKG is not ordered today.   Recent Labs: 11/23/2014: BUN 20; Creatinine, Ser 0.91; Hemoglobin 11.7; Platelets 263; Potassium 3.8; Sodium 134   Lipid Panel    Component Value Date/Time   CHOL  06/19/2007 0223    104        ATP III CLASSIFICATION:  <200     mg/dL   Desirable  200-239  mg/dL   Borderline High  >=240    mg/dL   High   TRIG 102 06/19/2007 0223   HDL 19 (L) 06/19/2007 0223   CHOLHDL 5.5  06/19/2007 0223   VLDL 20 06/19/2007 0223   LDLCALC  06/19/2007 0223    65        Total Cholesterol/HDL:CHD Risk Coronary Heart Disease Risk Table                     Men   Women  1/2 Average Risk   3.4   3.3       ASSESSMENT:    1. Shortness of breath      PLAN:  In order of problems listed above:  1. CAD: He is No longer describing exertional angina pectoris, although this may be secondary to less physical activity. He continues to have symptoms of stable angina, rather than unstable angina.  2. HTN: Good blood pressure control.  3. PAF: It seems he is still in persistent atrial fibrillation with borderline ventricular rate control. Increase carvedilol to 25 mg twice daily He is tolerating warfarin without problems. CHADSVasc score is 4 (age 44, HTN, CAD). He has never had a stroke or TIA. The arrhythmia itself does not appear to be associated with any worsening symptoms. Need to make sure he has preserved left ventricular systolic function, especially since we have had some difficulty with ventricular rate control and he has developed some edema. This may indeed have been related to excessive sodium consumption 4. 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 Discussed symptoms of AV block that he should be aware of. We might need to consider a dual-chamber pacemaker if he develops bradycardia or evidence of high-grade AV block. For the same reason I would like to avoid true antiarrhythmic drugs which would probably precipitate the need for pacemaker therapy.    Medication Adjustments/Labs and Tests  Ordered: Current medicines are reviewed at length with the patient today.  Concerns regarding medicines are outlined above.  Medication changes, Labs and Tests ordered today are listed in the Patient Instructions below. Patient Instructions  Medication Instructions: Dr Sallyanne Kuster has  recommended making the following medication changes: 1. INCREASE Carvedilol to 25 mg twice daily  Labwork: NONE ORDERED  Testing/Procedures: 1. Echocardiogram - Your physician has requested that you have an echocardiogram. Echocardiography is a painless test that uses sound waves to create images of your heart. It provides your doctor with information about the size and shape of your heart and how well your heart's chambers and valves are working. This procedure takes approximately one hour. There are no restrictions for this procedure. This has been ordered to be performed at [facility]  Follow-up: Dr Sallyanne Kuster recommends that you schedule a follow-up appointment first available.  If you need a refill on your cardiac medications before your next appointment, please call your pharmacy. Signed, Sanda Klein, MD  11/20/2015 4:40 PM    [facility] [place], [place], [place]  [address] Phone: [phone]; Fax: [phone]

## 2015-11-21 ENCOUNTER — Ambulatory Visit (INDEPENDENT_AMBULATORY_CARE_PROVIDER_SITE_OTHER): Payer: PPO | Admitting: *Deleted

## 2015-11-21 ENCOUNTER — Ambulatory Visit (HOSPITAL_COMMUNITY): Payer: PPO

## 2015-11-21 ENCOUNTER — Encounter (HOSPITAL_COMMUNITY): Payer: Self-pay

## 2015-11-21 DIAGNOSIS — I4891 Unspecified atrial fibrillation: Secondary | ICD-10-CM | POA: Diagnosis not present

## 2015-11-21 DIAGNOSIS — Z7901 Long term (current) use of anticoagulants: Secondary | ICD-10-CM | POA: Diagnosis not present

## 2015-11-21 DIAGNOSIS — R29898 Other symptoms and signs involving the musculoskeletal system: Secondary | ICD-10-CM

## 2015-11-21 DIAGNOSIS — M25512 Pain in left shoulder: Secondary | ICD-10-CM | POA: Diagnosis not present

## 2015-11-21 DIAGNOSIS — M25612 Stiffness of left shoulder, not elsewhere classified: Secondary | ICD-10-CM

## 2015-11-21 LAB — POCT INR: INR: 3

## 2015-11-21 NOTE — Patient Instructions (Addendum)
1) Shoulder Protraction    Begin with elbows by your side, slowly "punch" straight out in front of you.      2) Shoulder Flexion  Standing:         Begin with arms at your side with thumbs pointed up, slowly raise both arms up and forward towards overhead.          3) Horizontal abduction/adduction   Standing:           Begin with arms straight out in front of you, bring out to the side in at "T" shape. Keep arms straight entire time.       4) Internal & External Rotation    *No band* -Stand with elbows at the side and elbows bent 90 degrees. Move your forearms away from your body, then bring back inward toward the body.     5) Shoulder Abduction  Standing:       Lying on your back begin with your arms flat on the table next to your side. Slowly move your arms out to the side so that they go overhead, in a jumping jack or snow angel movement.       Repeat all exercises 10-15 times, 1-2 times per day.  

## 2015-11-21 NOTE — Therapy (Signed)
[place], [place], [address] Phone: [phone]   Fax:  [phone]  Occupational Therapy Treatment  Patient Details  Name: Jerry Cain MRN: [id number] Date of Birth: 12-06-1931 Referring Provider: Dr. Octavio Graves, DO  Encounter Date: 11/21/2015      OT End of Session - 11/21/15 1355    Visit Number 3   Number of Visits 8   Date for OT Re-Evaluation 12/30/15   Authorization Type [employer]   Authorization Time Period before 10th visit   Authorization - Visit Number 3   Authorization - Number of Visits 10   OT Start Time 1300   OT Stop Time 1345   OT Time Calculation (min) 45 min   Activity Tolerance Patient tolerated treatment well   Behavior During Therapy [facility] for tasks assessed/performed      Past Medical History:  Diagnosis Date  . Adenomatous colon polyp 2001  . Anxiety   . Atrial fibrillation ([place])    takes Coumadin daily  . BPH (benign prostatic hyperplasia)    takes Proscar daily  . CAD (coronary artery disease) 2009   cardiac cath and PTCA by Dr. Tami Ribas  . Carpal tunnel syndrome    right  . Complication of anesthesia    hard to wake up  . Diverticulosis 2007   tcs by Dr. Laural Golden  . GERD (gastroesophageal reflux disease)   . Gout   . Hypercholesterolemia    takes Atorvastatin daily  . Hypertension    takes Imdur,Coreg,and Lisinopril daily  . Hypothyroidism    takes SYnthroid daily  . Joint pain   . Joint swelling   . Nocturia   . Numbness    right hand pointer and middle finger  . Osteoarthritis    left knee  . Pneumonia    many,many yrs ago  . Tubular adenoma 2001    Past Surgical History:  Procedure Laterality Date  . Barbie Banner OSTEOTOMY Right 03/17/2013   Procedure: Barbie Banner OSTEOTOMY;  Surgeon: Marcheta Grammes, DPM;  [facility];  Service: Orthopedics;  Laterality: Right;  . BUNIONECTOMY Right 03/17/2013   Procedure: BUNIONECTOMY, Arthroplasty  2nd toe right foot;  Surgeon: Marcheta Grammes, DPM;  Location: [facility];  Service: Orthopedics;  Laterality: Right;  . CARDIAC CATHETERIZATION  06/11/2007   PTCA X2 in 06/2007:  2 overlapping Cypher stents to LAD (2.5x13 and 3.0x23)  . CARDIAC CATHETERIZATION  06/21/2007   Cypher stent 2.5 x 13  to OM branch  . CARDIOVERSION  12/12/2011   Procedure: CARDIOVERSION;  Surgeon: Sanda Klein, MD;  [facility];  Service: Cardiovascular;  Laterality: N/A;  . CATARACT EXTRACTION     bilateral  . COLONOSCOPY  2007   Dr. Laural Golden- L sided diverticulosis. Next TCS 2012 due to h/o tubular adenoma.  . COLONOSCOPY  02/26/2011   Procedure: COLONOSCOPY;  Surgeon: Daneil Dolin, MD;  [facility];  Service: Endoscopy;  Laterality: N/A;  11:05  . CORONARY ANGIOPLASTY     x 3   . FLEXOR TENOTOMY Left 03/17/2013   Procedure: PERCUTANEOUS FLEXOR TENOTOMY 3RD TOE LEFT FOOT;  Surgeon: Marcheta Grammes, DPM;  Location: [facility];  Service: Orthopedics;  Laterality: Left;  . HERNIA REPAIR  2009   left inguinal  . rot Left   . TEE WITHOUT CARDIOVERSION  12/12/2011   Procedure: TRANSESOPHAGEAL ECHOCARDIOGRAM (TEE);  Surgeon: Sanda Klein, MD;  Location: [place];  Service: Cardiovascular;  Laterality: N/A;  successful/sinus rhythm  . TOTAL KNEE ARTHROPLASTY Left 06/14/2013   Procedure: TOTAL KNEE ARTHROPLASTY;  Surgeon: Garald Balding, MD;  Location: [place];  Service: Orthopedics;  Laterality: Left;    There were no vitals filed for this visit.      Subjective Assessment - 11/21/15 1324    Subjective  S: I'm so stiff in the morning when I first wake up.   Special Tests FOTO score: 57/100   Currently in Pain? No/denies            [facility] OT Assessment - 11/21/15 1325      Assessment   Diagnosis Left Rotator Cuff Impingement Syndrome     Precautions   Precautions None                  OT Treatments/Exercises (OP) - 11/21/15 1325      Exercises    Exercises Shoulder     Shoulder Exercises: Supine   Protraction PROM;5 reps;AROM;15 reps   Horizontal ABduction PROM;5 reps;AROM;15 reps   External Rotation PROM;5 reps;AROM;15 reps   Internal Rotation PROM;5 reps;AROM;15 reps   Flexion PROM;5 reps;AROM;15 reps     Shoulder Exercises: Seated   Protraction AROM;10 reps   Horizontal ABduction AROM;10 reps   External Rotation AROM;10 reps   Internal Rotation AROM;10 reps   Flexion AROM;10 reps   Abduction AROM;10 reps     Shoulder Exercises: ROM/Strengthening   Proximal Shoulder Strengthening, Supine 15X no rest breaks   Proximal Shoulder Strengthening, Seated 10X with no rest breaks     Manual Therapy   Manual Therapy Myofascial release   Manual therapy comments Manual therapy completed prior to ROM measurements and treatment exercises.   Myofascial Release Myofascial release and manual stretching to left upper arm, shoulder, scapular, and cervical regions to decrease restrictions and improve pain free mobility                OT Education - 11/21/15 1353    Education provided Yes   Education Details Upgraded HEP to A/ROM exercises. Informed patient to only complete shoulder stretches and A/ROM now. Suggested that patient complete some A/ROM exercises prior to getting out of bed in the morning to decrease stiffness.    Person(s) Educated Patient   Methods Explanation;Demonstration;Verbal cues;Handout   Comprehension Returned demonstration;Verbalized understanding          OT Short Term Goals - 11/07/15 1409      OT SHORT TERM GOAL #1   Title Patient will be education and independent with HEP for improved functional use of left arm.   Time 4   Period Weeks   Status On-going     OT SHORT TERM GOAL #2   Title Patient will increase left shoulder P/ROM to [facility] to increase ability to dry his back and hang up his towel.   Time 4   Period Weeks   Status On-going     OT SHORT TERM GOAL #3   Title Patient will increase  left shoulder strength to 4+/5 to increase ability to pick up and stabalize household items.   Time 4   Period Weeks   Status On-going     OT SHORT TERM GOAL #4   Title Patient will decrease pain in his left shoulder region to 3/10 or better when sleeping at night.    Time 4   Period Weeks   Status On-going     OT SHORT TERM GOAL #5   Title Patient will decrease left shoulder  fascial restrictions to min-mod for improved mobility needed to reach overhead.   Time 4   Period Weeks   Status New           OT Long Term Goals - 11/07/15 1410      OT LONG TERM GOAL #1   Title Patient will return to highest level of independence with all daily tasks using LUE.   Time 8   Period Weeks   Status On-going     OT LONG TERM GOAL #2   Title Patient will increase AROM in left shoulder  to WNL to increase ability to hang up his towel, reach overhead, and complete gardening and canning activities.    Time 8   Period Weeks   Status On-going     OT LONG TERM GOAL #3   Title Patient will increase left shoulder strength to 5/5 for improved abilty to lift items onto overhead shelves and pick up gardening supplies.    Time 8   Period Weeks   Status On-going     OT LONG TERM GOAL #4   Title Patient will decrease fascial restrictions to trace amount in his left shoulder region in order to improve mobility needed to complete daily tasks.    Time 8   Period Weeks   Status On-going     OT LONG TERM GOAL #5   Title Patient will decrease pain level to 1/10 in his left shoulder  when completing daily tasks.   Time 8   Period Weeks   Status On-going               Plan - 11/21/15 1355    Clinical Impression Statement A: Progressed to A/ROM exercises supine and seated. Updated HEP. Patient has functional ROM at this point. Continues to have pain and discomfort during shoulder flexion and abduction. Overall, pt is progressing nicely. VC needed for form and technique.    Plan P: Follow up  on updated HEP. Add scapular theraband exercises to increase scapular strength. Update HEP weekly as needed.       Patient will benefit from skilled therapeutic intervention in order to improve the following deficits and impairments:  Decreased range of motion, Decreased strength, Increased fascial restricitons, Impaired UE functional use, Increased muscle spasms, Pain  Visit Diagnosis: Stiffness of left shoulder, not elsewhere classified  Other symptoms and signs involving the musculoskeletal system    Problem List Patient Active Problem List   Diagnosis Date Noted  . 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 Arthritis of knee, left 06/16/2013  . S/P total knee replacement using cement 06/14/2013  . Osteoarthritis of left knee 03/21/2013  . BPH (benign prostatic hyperplasia)   . GERD (gastroesophageal reflux disease)   . CAD (coronary artery disease)   . Fever 03/20/2013  . Altered mental state 03/20/2013  . Postoperative fever 03/20/2013  . PNA (pneumonia) 03/20/2013  . Preop cardiovascular exam 11/24/2012  . Peroneal tendonitis 10/05/2012  . Arthritis of foot, degenerative 10/05/2012  . HTN (hypertension) 09/25/2012  . Hyperlipidemia 09/25/2012  . Atrial fibrillation ([place]) 06/15/2012  . Long term (current) use of anticoagulants 06/15/2012  . Rotator cuff strain 02/04/2012  . Shoulder pain 02/04/2012  . Personal history of colonic polyps 01/29/2011  . High risk medication use 01/29/2011   Ailene Ravel, OTR/L,CBIS  [phone]   11/21/2015, 1:58 PM  [facility] [address] [place], [place], [address] Phone: [phone]   Fax:  [phone]  Name: Jerry Sia  Cain MRN: [id number] Date of Birth: [birth year]

## 2015-11-28 ENCOUNTER — Ambulatory Visit (HOSPITAL_COMMUNITY): Payer: PPO | Admitting: Specialist

## 2015-11-28 DIAGNOSIS — M25512 Pain in left shoulder: Secondary | ICD-10-CM | POA: Diagnosis not present

## 2015-11-28 DIAGNOSIS — M25612 Stiffness of left shoulder, not elsewhere classified: Secondary | ICD-10-CM

## 2015-11-28 DIAGNOSIS — R29898 Other symptoms and signs involving the musculoskeletal system: Secondary | ICD-10-CM

## 2015-11-28 NOTE — Patient Instructions (Signed)
  Complete 15 times each 2 times per day  (Home) Extension: Isometric / Bilateral Arm Retraction - Sitting   Facing anchor, hold hands and elbow at shoulder height, with elbow bent.  Pull arms back to squeeze shoulder blades together. Repeat 10-15 times.  Copyright  VHI. All rights reserved.   (Home) Retraction: Row - Bilateral (Anchor)   Facing anchor, arms reaching forward, pull hands toward stomach, keeping elbows bent and at your sides and pinching shoulder blades together. Repeat 10-15 times.  Copyright  VHI. All rights reserved.   (Clinic) Extension / Flexion (Assist)   Face anchor, pull arms back, keeping elbow straight, and squeze shoulder blades together. Repeat 10-15 times.   Copyright  VHI. All rights reserved.  External Rotation: Single Arm (Cable)    Arm across body, rotate arm away from torso, keeping upper arm against body. Do ____ sets. Complete ____ repetitions.  http://st.exer.us/534   Copyright  VHI. All rights  Strengthening: Resisted Internal Rotation    Hold tubing in left hand, elbow at side and forearm out. Rotate forearm in across body. Repeat ____ times per set. Do ____ sets per session. Do ____ sessions per day.  http://orth.exer.us/830   Copyright  VHI. All rights reserved.

## 2015-11-28 NOTE — Therapy (Signed)
[place], [place], [address] Phone: [phone]   Fax:  [phone]  Occupational Therapy Treatment  Patient Details  Name: Jerry Cain MRN: [id number] Date of Birth: 02/11/1932 Referring Provider: Dr. Octavio Graves, DO  Encounter Date: 11/28/2015      OT End of Session - 11/28/15 1213    Visit Number 4   Number of Visits 8   Date for OT Re-Evaluation 12/30/15   Authorization Type [employer]   Authorization Time Period before 10th visit   Authorization - Visit Number 4   Authorization - Number of Visits 10   OT Start Time [id number]   OT Stop Time 1217   OT Time Calculation (min) 42 min   Activity Tolerance Patient tolerated treatment well   Behavior During Therapy [facility] for tasks assessed/performed      Past Medical History:  Diagnosis Date  . Adenomatous colon polyp 2001  . Anxiety   . Atrial fibrillation ([place])    takes Coumadin daily  . BPH (benign prostatic hyperplasia)    takes Proscar daily  . CAD (coronary artery disease) 2009   cardiac cath and PTCA by Dr. Tami Ribas  . Carpal tunnel syndrome    right  . Complication of anesthesia    hard to wake up  . Diverticulosis 2007   tcs by Dr. Laural Golden  . GERD (gastroesophageal reflux disease)   . Gout   . Hypercholesterolemia    takes Atorvastatin daily  . Hypertension    takes Imdur,Coreg,and Lisinopril daily  . Hypothyroidism    takes SYnthroid daily  . Joint pain   . Joint swelling   . Nocturia   . Numbness    right hand pointer and middle finger  . Osteoarthritis    left knee  . Pneumonia    many,many yrs ago  . Tubular adenoma 2001    Past Surgical History:  Procedure Laterality Date  . Barbie Banner OSTEOTOMY Right 03/17/2013   Procedure: Barbie Banner OSTEOTOMY;  Surgeon: Marcheta Grammes, DPM;  [facility]: [facility];  Service: Orthopedics;  Laterality: Right;  . BUNIONECTOMY Right 03/17/2013   Procedure: BUNIONECTOMY, Arthroplasty  2nd toe right foot;  Surgeon: Marcheta Grammes, DPM;  Location: [facility];  Service: Orthopedics;  Laterality: Right;  . CARDIAC CATHETERIZATION  06/11/2007   PTCA X2 in 06/2007:  2 overlapping Cypher stents to LAD (2.5x13 and 3.0x23)  . CARDIAC CATHETERIZATION  06/21/2007   Cypher stent 2.5 x 13  to OM branch  . CARDIOVERSION  12/12/2011   Procedure: CARDIOVERSION;  Surgeon: Sanda Klein, MD;  [facility];  Service: Cardiovascular;  Laterality: N/A;  . CATARACT EXTRACTION     bilateral  . COLONOSCOPY  2007   Dr. Laural Golden- L sided diverticulosis. Next TCS 2012 due to h/o tubular adenoma.  . COLONOSCOPY  02/26/2011   Procedure: COLONOSCOPY;  Surgeon: Daneil Dolin, MD;  [facility];  Service: Endoscopy;  Laterality: N/A;  11:05  . CORONARY ANGIOPLASTY     x 3   . FLEXOR TENOTOMY Left 03/17/2013   Procedure: PERCUTANEOUS FLEXOR TENOTOMY 3RD TOE LEFT FOOT;  Surgeon: Marcheta Grammes, DPM;  Location: [facility];  Service: Orthopedics;  Laterality: Left;  . HERNIA REPAIR  2009   left inguinal  . rot Left   . TEE WITHOUT CARDIOVERSION  12/12/2011   Procedure: TRANSESOPHAGEAL ECHOCARDIOGRAM (TEE);  Surgeon: Sanda Klein, MD;  Location: [place];  Service: Cardiovascular;  Laterality: N/A;  successful/sinus rhythm  . TOTAL KNEE ARTHROPLASTY Left 06/14/2013   Procedure: TOTAL KNEE ARTHROPLASTY;  Surgeon: Garald Balding, MD;  Location: [place];  Service: Orthopedics;  Laterality: Left;    There were no vitals filed for this visit.      Subjective Assessment - 11/28/15 1151    Subjective  S: My shoulder is doing better, I think my chest is hurting more, and its coming from this knot in my back.   Currently in Pain? Yes   Pain Score 5    Pain Location Chest   Pain Orientation Left   Pain Descriptors / Indicators Aching   Pain Type Acute pain   Pain Onset More than a month ago   Pain Frequency Constant   Aggravating Factors  arm movement   Pain Relieving  Factors rest heat   Effect of Pain on Daily Activities decreased use of left arm with functional activities             [facility] OT Assessment - 11/28/15 0001      Assessment   Diagnosis Left Rotator Cuff Impingement Syndrome                  OT Treatments/Exercises (OP) - 11/28/15 0001      Exercises   Exercises Shoulder     Shoulder Exercises: Supine   Protraction PROM;5 reps;AROM;15 reps   Horizontal ABduction PROM;5 reps;AROM;15 reps   External Rotation PROM;5 reps;AROM;15 reps   Internal Rotation PROM;5 reps;AROM;15 reps   Flexion PROM;5 reps;AROM;15 reps   ABduction PROM;5 reps;AROM;15 reps     Shoulder Exercises: Seated   Extension Theraband;10 reps   Theraband Level (Shoulder Extension) Level 2 (Red)   Retraction Theraband;10 reps   Theraband Level (Shoulder Retraction) Level 2 (Red)   Row Theraband;10 reps   Theraband Level (Shoulder Row) Level 2 (Red)   External Rotation Theraband;10 reps   Theraband Level (Shoulder External Rotation) Level 2 (Red)   Internal Rotation Theraband;10 reps   Theraband Level (Shoulder Internal Rotation) Level 2 (Red)     Shoulder Exercises: ROM/Strengthening   UBE (Upper Arm Bike) 3' forward and 3' reverse at 1.0   "W" Arms  10 times with min cuing for techniqyue    X to V Arms 10 times with min cuing for technique   Proximal Shoulder Strengthening, Supine 15X no rest breaks   Proximal Shoulder Strengthening, Seated 10X with no rest breaks   Ball on Wall 1' in flexion and 1' abduction      Manual Therapy   Manual Therapy Myofascial release   Manual therapy comments Manual therapy completed prior to ROM measurements and treatment exercises.   Myofascial Release Myofascial release and manual stretching to left upper arm, shoulder, scapular, and cervical regions to decrease restrictions and improve pain free mobility                OT Education - 11/28/15 1157    Education provided Yes   Education Details  added scapular theraband for extension, retraction, row, external rotation, and internal rotation    Person(s) Educated Patient   Methods Explanation;Demonstration;Verbal cues;Handout   Comprehension Verbalized understanding;Returned demonstration          OT Short Term Goals - 11/07/15 1409      OT SHORT TERM GOAL #1   Title Patient will be education and independent with HEP for improved functional use of left arm.   Time 4   Period Weeks   Status On-going  OT SHORT TERM GOAL #2   Title Patient will increase left shoulder P/ROM to [facility] to increase ability to dry his back and hang up his towel.   Time 4   Period Weeks   Status On-going     OT SHORT TERM GOAL #3   Title Patient will increase left shoulder strength to 4+/5 to increase ability to pick up and stabalize household items.   Time 4   Period Weeks   Status On-going     OT SHORT TERM GOAL #4   Title Patient will decrease pain in his left shoulder region to 3/10 or better when sleeping at night.    Time 4   Period Weeks   Status On-going     OT SHORT TERM GOAL #5   Title Patient will decrease left shoulder fascial restrictions to min-mod for improved mobility needed to reach overhead.   Time 4   Period Weeks   Status New           OT Long Term Goals - 11/07/15 1410      OT LONG TERM GOAL #1   Title Patient will return to highest level of independence with all daily tasks using LUE.   Time 8   Period Weeks   Status On-going     OT LONG TERM GOAL #2   Title Patient will increase AROM in left shoulder  to WNL to increase ability to hang up his towel, reach overhead, and complete gardening and canning activities.    Time 8   Period Weeks   Status On-going     OT LONG TERM GOAL #3   Title Patient will increase left shoulder strength to 5/5 for improved abilty to lift items onto overhead shelves and pick up gardening supplies.    Time 8   Period Weeks   Status On-going     OT LONG TERM GOAL #4    Title Patient will decrease fascial restrictions to trace amount in his left shoulder region in order to improve mobility needed to complete daily tasks.    Time 8   Period Weeks   Status On-going     OT LONG TERM GOAL #5   Title Patient will decrease pain level to 1/10 in his left shoulder  when completing daily tasks.   Time 8   Period Weeks   Status On-going               Plan - 11/28/15 1213    Clinical Impression Statement A: Patient is demonstrating marked improvement in his A/ROM, specifically with abduction.  Patient required tactile and verbal cues with scapular stability exercises this date. Added self massage and scapular theraband to HEP.   Plan P:  Follow up on HEP Add plank in modified position and theraband for shoulder flexion, abduction, and horizontal abduction in order to strengthen shoulder to PLOF needed for ADLs.   Consulted and Agree with Plan of Care Patient      Patient will benefit from skilled therapeutic intervention in order to improve the following deficits and impairments:  Decreased range of motion, Decreased strength, Increased fascial restricitons, Impaired UE functional use, Increased muscle spasms, Pain  Visit Diagnosis: Stiffness of left shoulder, not elsewhere classified  Pain in left shoulder  Other symptoms and signs involving the musculoskeletal system    Problem List Patient Active Problem List   Diagnosis Date Noted  . 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 Arthritis of knee, left 06/16/2013  . S/P total  knee replacement using cement 06/14/2013  . Osteoarthritis of left knee 03/21/2013  . BPH (benign prostatic hyperplasia)   . GERD (gastroesophageal reflux disease)   . CAD (coronary artery disease)   . Fever 03/20/2013  . Altered mental state 03/20/2013  . Postoperative fever 03/20/2013  . PNA (pneumonia) 03/20/2013  . Preop cardiovascular exam 11/24/2012  . Peroneal tendonitis 10/05/2012  . Arthritis of foot, degenerative  10/05/2012  . HTN (hypertension) 09/25/2012  . Hyperlipidemia 09/25/2012  . Atrial fibrillation ([place]) 06/15/2012  . Long term (current) use of anticoagulants 06/15/2012  . Rotator cuff strain 02/04/2012  . Shoulder pain 02/04/2012  . Personal history of colonic polyps 01/29/2011  . High risk medication use 01/29/2011    Vangie Bicker, [place], OTR/L [phone]  11/28/2015, 12:16 PM  [facility] [address] [place], [place], [address] Phone: [phone]   Fax:  [phone]  Name: Jerry Cain MRN: [id number] Date of Birth: 1931-07-07

## 2015-11-29 ENCOUNTER — Ambulatory Visit (HOSPITAL_COMMUNITY)
Admission: RE | Admit: 2015-11-29 | Discharge: 2015-11-29 | Disposition: A | Discharge status: Home / Self Care | Payer: PPO | Source: Ambulatory Visit | Attending: Cardiovascular Disease | Admitting: Cardiovascular Disease

## 2015-11-29 ENCOUNTER — Encounter: Payer: Self-pay | Admitting: *Deleted

## 2015-11-29 DIAGNOSIS — E785 Hyperlipidemia, unspecified: Secondary | ICD-10-CM | POA: Insufficient documentation

## 2015-11-29 DIAGNOSIS — R0602 Shortness of breath: Secondary | ICD-10-CM | POA: Diagnosis not present

## 2015-11-29 DIAGNOSIS — I480 Paroxysmal atrial fibrillation: Secondary | ICD-10-CM | POA: Insufficient documentation

## 2015-11-29 DIAGNOSIS — I2510 Atherosclerotic heart disease of native coronary artery without angina pectoris: Secondary | ICD-10-CM | POA: Diagnosis not present

## 2015-11-29 DIAGNOSIS — K219 Gastro-esophageal reflux disease without esophagitis: Secondary | ICD-10-CM | POA: Insufficient documentation

## 2015-11-29 DIAGNOSIS — I358 Other nonrheumatic aortic valve disorders: Secondary | ICD-10-CM | POA: Diagnosis not present

## 2015-11-29 DIAGNOSIS — I119 Hypertensive heart disease without heart failure: Secondary | ICD-10-CM | POA: Diagnosis not present

## 2015-11-29 DIAGNOSIS — R0600 Dyspnea, unspecified: Secondary | ICD-10-CM | POA: Diagnosis not present

## 2015-11-29 NOTE — Progress Notes (Signed)
*  PRELIMINARY RESULTS* Echocardiogram 2D Echocardiogram has been performed.  Jerry Cain 11/29/2015, 10:07 AM

## 2015-12-05 ENCOUNTER — Ambulatory Visit (HOSPITAL_COMMUNITY): Payer: PPO | Admitting: Occupational Therapy

## 2015-12-05 ENCOUNTER — Encounter (HOSPITAL_COMMUNITY): Payer: Self-pay | Admitting: Occupational Therapy

## 2015-12-05 DIAGNOSIS — M25612 Stiffness of left shoulder, not elsewhere classified: Secondary | ICD-10-CM

## 2015-12-05 DIAGNOSIS — R29898 Other symptoms and signs involving the musculoskeletal system: Secondary | ICD-10-CM

## 2015-12-05 DIAGNOSIS — M25512 Pain in left shoulder: Secondary | ICD-10-CM

## 2015-12-05 NOTE — Therapy (Signed)
[place], [place], [address] Phone: [phone]   Fax:  [phone]  Occupational Therapy Treatment  Patient Details  Name: Jerry Cain MRN: [id number] Date of Birth: Feb 03, 1932 Referring Provider: Dr. Octavio Graves, DO  Encounter Date: 12/05/2015      OT End of Session - 12/05/15 1204    Visit Number 5   Number of Visits 8   Date for OT Re-Evaluation 12/30/15   Authorization Type [employer]   Authorization Time Period before 10th visit   Authorization - Visit Number 5   Authorization - Number of Visits 10   OT Start Time 1119   OT Stop Time 1201   OT Time Calculation (min) 42 min   Activity Tolerance Patient tolerated treatment well   Behavior During Therapy [facility] for tasks assessed/performed      Past Medical History:  Diagnosis Date  . Adenomatous colon polyp 2001  . Anxiety   . Atrial fibrillation ([place])    takes Coumadin daily  . BPH (benign prostatic hyperplasia)    takes Proscar daily  . CAD (coronary artery disease) 2009   cardiac cath and PTCA by Dr. Tami Ribas  . Carpal tunnel syndrome    right  . Complication of anesthesia    hard to wake up  . Diverticulosis 2007   tcs by Dr. Laural Golden  . GERD (gastroesophageal reflux disease)   . Gout   . Hypercholesterolemia    takes Atorvastatin daily  . Hypertension    takes Imdur,Coreg,and Lisinopril daily  . Hypothyroidism    takes SYnthroid daily  . Joint pain   . Joint swelling   . Nocturia   . Numbness    right hand pointer and middle finger  . Osteoarthritis    left knee  . Pneumonia    many,many yrs ago  . Tubular adenoma 2001    Past Surgical History:  Procedure Laterality Date  . Barbie Banner OSTEOTOMY Right 03/17/2013   Procedure: Barbie Banner OSTEOTOMY;  Surgeon: Marcheta Grammes, DPM;  [facility]: [facility];  Service: Orthopedics;  Laterality: Right;  . BUNIONECTOMY Right 03/17/2013   Procedure: BUNIONECTOMY, Arthroplasty  2nd toe right foot;  Surgeon: Marcheta Grammes, DPM;  [facility];  Service: Orthopedics;  Laterality: Right;  . CARDIAC CATHETERIZATION  06/11/2007   PTCA X2 in 06/2007:  2 overlapping Cypher stents to LAD (2.5x13 and 3.0x23)  . CARDIAC CATHETERIZATION  06/21/2007   Cypher stent 2.5 x 13  to OM branch  . CARDIOVERSION  12/12/2011   Procedure: CARDIOVERSION;  Surgeon: Sanda Klein, MD;  [facility];  Service: Cardiovascular;  Laterality: N/A;  . CATARACT EXTRACTION     bilateral  . COLONOSCOPY  2007   Dr. Laural Golden- L sided diverticulosis. Next TCS 2012 due to h/o tubular adenoma.  . COLONOSCOPY  02/26/2011   Procedure: COLONOSCOPY;  Surgeon: Daneil Dolin, MD;  [facility];  Service: Endoscopy;  Laterality: N/A;  11:05  . CORONARY ANGIOPLASTY     x 3   . FLEXOR TENOTOMY Left 03/17/2013   Procedure: PERCUTANEOUS FLEXOR TENOTOMY 3RD TOE LEFT FOOT;  Surgeon: Marcheta Grammes, DPM;  [facility];  Service: Orthopedics;  Laterality: Left;  . HERNIA REPAIR  2009   left inguinal  . rot Left   . TEE WITHOUT CARDIOVERSION  12/12/2011   Procedure: TRANSESOPHAGEAL ECHOCARDIOGRAM (TEE);  Surgeon: Sanda Klein, MD;  Location: [place];  Service: Cardiovascular;  Laterality: N/A;  successful/sinus rhythm  . TOTAL KNEE ARTHROPLASTY Left 06/14/2013   Procedure: TOTAL KNEE ARTHROPLASTY;  Surgeon: Garald Balding, MD;  Location: [place];  Service: Orthopedics;  Laterality: Left;    There were no vitals filed for this visit.      Subjective Assessment - 12/05/15 1121    Subjective  S: I fell over some chicken wire in my garden about a month ago.    Currently in Pain? No/denies            [facility] OT Assessment - 12/05/15 1121      Assessment   Diagnosis Left Rotator Cuff Impingement Syndrome     Precautions   Precautions None                  OT Treatments/Exercises (OP) - 12/05/15 1123      Exercises   Exercises Shoulder      Shoulder Exercises: Supine   Protraction PROM;5 reps;AROM;15 reps   Horizontal ABduction PROM;5 reps;AROM;15 reps   External Rotation PROM;5 reps;AROM;15 reps   Internal Rotation PROM;5 reps;AROM;15 reps   Flexion PROM;5 reps;AROM;15 reps   ABduction PROM;5 reps;AROM;15 reps     Shoulder Exercises: Seated   Extension Theraband;15 reps   Theraband Level (Shoulder Extension) Level 2 (Red)   Retraction Theraband;15 reps   Theraband Level (Shoulder Retraction) Level 2 (Red)   Row Theraband;15 reps   Theraband Level (Shoulder Row) Level 2 (Red)   Horizontal ABduction Theraband;10 reps   Theraband Level (Shoulder Horizontal ABduction) Level 2 (Red)   External Rotation Theraband;10 reps   Theraband Level (Shoulder External Rotation) Level 2 (Red)   Internal Rotation Theraband;10 reps   Theraband Level (Shoulder Internal Rotation) Level 2 (Red)   Flexion Theraband;10 reps   Theraband Level (Shoulder Flexion) Level 2 (Red)   Abduction Theraband;10 reps   Theraband Level (Shoulder ABduction) Level 2 (Red)     Shoulder Exercises: ROM/Strengthening   Proximal Shoulder Strengthening, Supine 15X no rest breaks   Ball on Wall 1' in flexion and 1' abduction      Manual Therapy   Manual Therapy Myofascial release   Manual therapy comments Manual therapy completed prior to ROM measurements and treatment exercises.   Myofascial Release Myofascial release and manual stretching to left upper arm, shoulder, scapular, and cervical regions to decrease restrictions and improve pain free mobility                  OT Short Term Goals - 11/07/15 1409      OT SHORT TERM GOAL #1   Title Patient will be education and independent with HEP for improved functional use of left arm.   Time 4   Period Weeks   Status On-going     OT SHORT TERM GOAL #2   Title Patient will increase left shoulder P/ROM to [facility] to increase ability to dry his back and hang up his towel.   Time 4   Period Weeks    Status On-going     OT SHORT TERM GOAL #3   Title Patient will increase left shoulder strength to 4+/5 to increase ability to pick up and stabalize household items.   Time 4   Period Weeks   Status On-going     OT SHORT TERM GOAL #4   Title Patient will decrease pain in his left shoulder region to 3/10 or better when sleeping at night.    Time 4   Period Weeks   Status On-going  OT SHORT TERM GOAL #5   Title Patient will decrease left shoulder fascial restrictions to min-mod for improved mobility needed to reach overhead.   Time 4   Period Weeks   Status New           OT Long Term Goals - 11/07/15 1410      OT LONG TERM GOAL #1   Title Patient will return to highest level of independence with all daily tasks using LUE.   Time 8   Period Weeks   Status On-going     OT LONG TERM GOAL #2   Title Patient will increase AROM in left shoulder  to WNL to increase ability to hang up his towel, reach overhead, and complete gardening and canning activities.    Time 8   Period Weeks   Status On-going     OT LONG TERM GOAL #3   Title Patient will increase left shoulder strength to 5/5 for improved abilty to lift items onto overhead shelves and pick up gardening supplies.    Time 8   Period Weeks   Status On-going     OT LONG TERM GOAL #4   Title Patient will decrease fascial restrictions to trace amount in his left shoulder region in order to improve mobility needed to complete daily tasks.    Time 8   Period Weeks   Status On-going     OT LONG TERM GOAL #5   Title Patient will decrease pain level to 1/10 in his left shoulder  when completing daily tasks.   Time 8   Period Weeks   Status On-going               Plan - 12/05/15 1204    Clinical Impression Statement A: Added flexion, abduction, and horizontal abduction with red theraband this session, pt required verbal and visual cuing for correct form. Pt reports he has not done his exercises this week, A/ROM  continues to be [facility].    Rehab Potential Good   OT Frequency 1x / week   OT Duration 8 weeks   OT Treatment/Interventions Self-care/ADL training;Cryotherapy;Electrical Stimulation;Moist Heat;Iontophoresis;Ultrasound;Therapeutic exercise;Neuromuscular education;Energy conservation;DME and/or AE instruction;Passive range of motion;Manual Therapy;Therapeutic exercises;Therapeutic activities;Patient/family education   Plan P: Add modified plank, follow up on HEP completion. Continue working to improve shoulder strength required for ADL completion      Patient will benefit from skilled therapeutic intervention in order to improve the following deficits and impairments:  Decreased range of motion, Decreased strength, Increased fascial restricitons, Impaired UE functional use, Increased muscle spasms, Pain  Visit Diagnosis: Stiffness of left shoulder, not elsewhere classified  Pain in left shoulder  Other symptoms and signs involving the musculoskeletal system    Problem List Patient Active Problem List   Diagnosis Date Noted  . 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 Arthritis of knee, left 06/16/2013  . S/P total knee replacement using cement 06/14/2013  . Osteoarthritis of left knee 03/21/2013  . BPH (benign prostatic hyperplasia)   . GERD (gastroesophageal reflux disease)   . CAD (coronary artery disease)   . Fever 03/20/2013  . Altered mental state 03/20/2013  . Postoperative fever 03/20/2013  . PNA (pneumonia) 03/20/2013  . Preop cardiovascular exam 11/24/2012  . Peroneal tendonitis 10/05/2012  . Arthritis of foot, degenerative 10/05/2012  . HTN (hypertension) 09/25/2012  . Hyperlipidemia 09/25/2012  . Atrial fibrillation ([place]) 06/15/2012  . Long term (current) use of anticoagulants 06/15/2012  . Rotator cuff strain 02/04/2012  . Shoulder pain 02/04/2012  .  Personal history of colonic polyps 01/29/2011  . High risk medication use 01/29/2011   Guadelupe Sabin, OTR/L   [phone] 12/05/2015, 12:07 PM  [facility] [address] [place], [place], [address] Phone: [phone]   Fax:  [phone]  Name: Jerry Cain MRN: [id number] Date of Birth: February 14, 1932

## 2015-12-12 ENCOUNTER — Ambulatory Visit (INDEPENDENT_AMBULATORY_CARE_PROVIDER_SITE_OTHER): Payer: PPO | Admitting: Cardiovascular Disease

## 2015-12-12 ENCOUNTER — Ambulatory Visit (HOSPITAL_COMMUNITY): Payer: PPO | Attending: *Deleted | Admitting: Specialist

## 2015-12-12 ENCOUNTER — Encounter: Payer: Self-pay | Admitting: Cardiovascular Disease

## 2015-12-12 VITALS — BP 120/70 | HR 93 | Ht 71.0 in | Wt 203.4 lb

## 2015-12-12 DIAGNOSIS — I2510 Atherosclerotic heart disease of native coronary artery without angina pectoris: Secondary | ICD-10-CM | POA: Diagnosis not present

## 2015-12-12 DIAGNOSIS — I10 Essential (primary) hypertension: Secondary | ICD-10-CM

## 2015-12-12 DIAGNOSIS — R29898 Other symptoms and signs involving the musculoskeletal system: Secondary | ICD-10-CM | POA: Diagnosis not present

## 2015-12-12 DIAGNOSIS — I453 Trifascicular block: Secondary | ICD-10-CM | POA: Diagnosis not present

## 2015-12-12 DIAGNOSIS — I4891 Unspecified atrial fibrillation: Secondary | ICD-10-CM

## 2015-12-12 DIAGNOSIS — M25512 Pain in left shoulder: Secondary | ICD-10-CM | POA: Diagnosis not present

## 2015-12-12 DIAGNOSIS — Z7901 Long term (current) use of anticoagulants: Secondary | ICD-10-CM

## 2015-12-12 DIAGNOSIS — M25612 Stiffness of left shoulder, not elsewhere classified: Secondary | ICD-10-CM | POA: Diagnosis not present

## 2015-12-12 NOTE — Patient Instructions (Signed)
Dr Croitoru recommends that you schedule a follow-up appointment in 6 months. You will receive a reminder letter in the mail two months in advance. If you don't receive a letter, please call our office to schedule the follow-up appointment.  If you need a refill on your cardiac medications before your next appointment, please call your pharmacy. 

## 2015-12-12 NOTE — Progress Notes (Signed)
[name], male   DOB: [birth year], 82 y.o.   MRN: [id number] Patient ID: Jerry HEMPLE, male   DOB: 1931/04/12, 82 y.o.   MRN: [id number]    Cardiology Office Note    Date:  12/12/2015   ID:  HUTCHINSON HANNASCH, DOB Feb 16, 1932, MRN [id number]  PCP:  Octavio Graves, DO  Cardiologist:   Sanda Klein, MD   Chief Complaint  Patient presents with  . Follow-up    pt c/o SOB    History of Present Illness:  Jerry Cain is a 82 y.o. male with CAD and paroxysmal atrial fibrillation, here for follow up. He is on warfarin anticoagulation.  He was complaining of exertional dyspnea and heart rate control was inadequate. Repeat echo shows that he has normal left ventricular systolic function. The dose of carvedilol was increased and his exertional dyspnea has definitely improved. He had some problems with coughing and transient worsening of his shortness of breath after ripping up some moldy boards from his closet, but this is also getting better now. He has been recording his blood pressure and heart rates at home. Typically the heart rates are in the 70s-90s and the blood pressure hovers is in the 120s-130s/70s-90. He has not had any problems with dizziness or near syncope or extreme fatigue. He does have bifascicular block and when he is in sinus rhythm he has a long PR interval. There has never been a clear association between his dyspnea and whether he is in sinus rhythm or atrial fibrillation. He is in atrial fibrillation today and as usual is oblivious to the presence of the arrhythmia.  In 2009, he received drug eluting Cypher stents to the LAD and the circumflex coronary arteries. Comorbidity includes systemic hypertension and hyperlipidemia both of which are adequately treated. By echocardiography in 2013 he has preserved left ventricular systolic function and no valvular abnormalities. He had a normal stress nuclear myocardial scan in September of 2013.   Past  Medical History:  Diagnosis Date  . Adenomatous colon polyp 2001  . Anxiety   . Atrial fibrillation ([place])    takes Coumadin daily  . BPH (benign prostatic hyperplasia)    takes Proscar daily  . CAD (coronary artery disease) 2009   cardiac cath and PTCA by Dr. Tami Ribas  . Carpal tunnel syndrome    right  . Complication of anesthesia    hard to wake up  . Diverticulosis 2007   tcs by Dr. Laural Golden  . GERD (gastroesophageal reflux disease)   . Gout   . Hypercholesterolemia    takes Atorvastatin daily  . Hypertension    takes Imdur,Coreg,and Lisinopril daily  . Hypothyroidism    takes SYnthroid daily  . Joint pain   . Joint swelling   . Nocturia   . Numbness    right hand pointer and middle finger  . Osteoarthritis    left knee  . Pneumonia    many,many yrs ago  . Tubular adenoma 2001    Past Surgical History:  Procedure Laterality Date  . Barbie Banner OSTEOTOMY Right 03/17/2013   Procedure: Barbie Banner OSTEOTOMY;  Surgeon: Marcheta Grammes, DPM;  [facility];  Service: Orthopedics;  Laterality: Right;  . BUNIONECTOMY Right 03/17/2013   Procedure: BUNIONECTOMY, Arthroplasty 2nd toe right foot;  Surgeon: Marcheta Grammes, DPM;  Location: [facility];  Service: Orthopedics;  Laterality: Right;  . CARDIAC CATHETERIZATION  06/11/2007   PTCA X2 in 06/2007:  2 overlapping Cypher stents to LAD (2.5x13  and 3.0x23)  . CARDIAC CATHETERIZATION  06/21/2007   Cypher stent 2.5 x 13  to OM branch  . CARDIOVERSION  12/12/2011   Procedure: CARDIOVERSION;  Surgeon: Sanda Klein, MD;  [facility];  Service: Cardiovascular;  Laterality: N/A;  . CATARACT EXTRACTION     bilateral  . COLONOSCOPY  2007   Dr. Laural Golden- L sided diverticulosis. Next TCS 2012 due to h/o tubular adenoma.  . COLONOSCOPY  02/26/2011   Procedure: COLONOSCOPY;  Surgeon: Daneil Dolin, MD;  [facility];  Service: Endoscopy;  Laterality: N/A;  11:05  . CORONARY ANGIOPLASTY     x 3   . FLEXOR  TENOTOMY Left 03/17/2013   Procedure: PERCUTANEOUS FLEXOR TENOTOMY 3RD TOE LEFT FOOT;  Surgeon: Marcheta Grammes, DPM;  Location: [facility];  Service: Orthopedics;  Laterality: Left;  . HERNIA REPAIR  2009   left inguinal  . rot Left   . TEE WITHOUT CARDIOVERSION  12/12/2011   Procedure: TRANSESOPHAGEAL ECHOCARDIOGRAM (TEE);  Surgeon: Sanda Klein, MD;  Location: [facility];  Service: Cardiovascular;  Laterality: N/A;   successful/sinus rhythm  . TOTAL KNEE ARTHROPLASTY Left 06/14/2013   Procedure: TOTAL KNEE ARTHROPLASTY;  Surgeon: Garald Balding, MD;  Location: [facility];  Service: Orthopedics;  Laterality: Left;    Outpatient Medications Prior to Visit  Medication Sig Dispense Refill  . alfuzosin (UROXATRAL) 10 MG 24 hr tablet Take 10 mg by mouth daily with breakfast.    . aspirin 81 MG tablet Take 81 mg by mouth every morning.     . Calcium Carbonate-Vitamin D (CALCIUM + D) 600-200 MG-UNIT TABS Take 1 tablet by mouth 2 (two) times daily.     . carvedilol (COREG) 25 MG tablet Take 1 tablet (25 mg total) by mouth 2 (two) times daily with a meal. 6 tablet 11  . finasteride (PROSCAR) 5 MG tablet Take 5 mg by mouth every morning.     . Glucosamine 750 MG TABS Take 750 mg by mouth 2 (two) times daily.    . isosorbide mononitrate (IMDUR) 60 MG 24 hr tablet Take 1 tablet (60 mg total) by mouth daily. 30 tablet 11  . levothyroxine (SYNTHROID, LEVOTHROID) 125 MCG tablet Take 125 mcg by mouth daily before breakfast.     . lisinopril (PRINIVIL,ZESTRIL) 20 MG tablet TAKE ONE TABLET BY MOUTH AT BEDTIME (8PM). 30 tablet 11  . Multiple Vitamin (MULTIVITAMIN) capsule Take 1 capsule by mouth every morning.     . nitroGLYCERIN (NITROSTAT) 0.4 MG SL tablet Place 1 tablet (0.4 mg total) under the tongue every 5 (five) minutes as needed for chest pain. 25 tablet 3  . rosuvastatin (CRESTOR) 10 MG tablet TAKE (1) TABLET BY MOUTH ONCE A WEEK. 4 tablet 11  . traMADol (ULTRAM) 50 MG tablet Take 50 mg by mouth  every 6 (six) hours as needed.     . warfarin (COUMADIN) 5 MG tablet TAKE 1 TABLET BY MOUTH DAILY OR AS DIRECTED BY COUMADIN CLINIC. 90 tablet 1   No facility-administered medications prior to visit.      Allergies:   Carbapenems; Cephalosporins; and Penicillins   Social History   Social History  . Marital status: Married    Spouse name: N/A  . Number of children: 4  . Years of education: N/A   Occupational History  . retired    Social History Main Topics  . Smoking status: Never Smoker  . Smokeless tobacco: Never Used  . Alcohol use No  .  Drug use: No  . Sexual activity: Not Asked   Other Topics Concern  . None   Social History Narrative  . None     Family History:  The patient's family history includes Heart disease in his brother; Pneumonia in his father.   ROS:   Please see the history of present illness.    ROS All other systems reviewed and are negative.   PHYSICAL EXAM:   VS:  BP 120/70 (BP Location: Right Arm, Patient Position: Sitting, Cuff Size: Normal)   Pulse 93   Ht 5\' 11"  (1.803 m)   Wt 203 lb 6.4 oz (92.3 kg)   SpO2 97%   BMI 28.37 kg/m    GEN: Well nourished, well developed, in no acute distress  HEENT: normal  Neck: no JVD, carotid bruits, or masses Cardiac: irregular rhythm; widely split second heart sound, no murmurs, rubs, or gallops, 1-2 plus symmetrical ankle and pedal edema  Respiratory:  clear to auscultation bilaterally, normal work of breathing GI: soft, nontender, nondistended, + BS MS: no deformity or atrophy  Skin: warm and dry, no rash Neuro:  Alert and Oriented x 3, Strength and sensation are intact Psych: euthymic mood, full affect  Wt Readings from Last 3 Encounters:  12/12/15 203 lb 6.4 oz (92.3 kg)  11/19/15 202 lb (91.6 kg)  08/16/15 204 lb 3.2 oz (92.6 kg)      Studies/Labs Reviewed:   EKG:  EKG is not ordered today.   Recent Labs: No results found for requested labs within last 8760 hours.   Lipid Panel      Component Value Date/Time   CHOL  06/19/2007 0223    104        ATP III CLASSIFICATION:  <200     mg/dL   Desirable  200-239  mg/dL   Borderline High  >=240    mg/dL   High   TRIG 102 06/19/2007 0223   HDL 19 (L) 06/19/2007 0223   CHOLHDL 5.5 06/19/2007 0223   VLDL 20 06/19/2007 0223   LDLCALC  06/19/2007 0223    65        Total Cholesterol/HDL:CHD Risk Coronary Heart Disease Risk Table                     Men   Women  1/2 Average Risk   3.4   3.3       ASSESSMENT:    1. Atrial fibrillation, unspecified type ([place])   2. Coronary artery disease involving native coronary artery of native heart without angina pectoris   3. Trifascicular block   4. Essential hypertension   5. Long term (current) use of anticoagulants      PLAN:  In order of problems listed above:  1.  AFib: last appointment, increased carvedilol to 25 mg twice daily. Symptoms are improved. He probably had uncontrolled tachycardia with activity. He is tolerating warfarin without problems. CHADSVasc score is 4 (age 53, HTN, CAD). He has never had a stroke or TIA. The arrhythmia itself does not appear to be associated with any worsening symptoms as long as the rate is controlled. Recent echo confirms preserved left ventricular systolic function. Edema is improving and may indeed have been related to excessive sodium consumption. 2. Trifascicular block: No signs or symptoms of high-grade AV block even after increasing the dose of carvedilol. Discussed symptoms of AV block that he should be aware of. We will need to go ahead with implantation of a dual-chamber pacemaker  if he develops bradycardia or evidence of high-grade AV block. For the same reason I would like to avoid true antiarrhythmic drugs which would probably precipitate the need for pacemaker therapy. 3. CAD: He has well controlled symptoms of stable angina.  4. HTN: Good blood pressure control.  5. Warfarin anticoagulation: Well-tolerated without bleeding  complications     Medication Adjustments/Labs and Tests Ordered: Current medicines are reviewed at length with the patient today.  Concerns regarding medicines are outlined above.  Medication changes, Labs and Tests ordered today are listed in the Patient Instructions below. Patient Instructions  Dr Sallyanne Kuster recommends that you schedule a follow-up appointment in 6 months. You will receive a reminder letter in the mail two months in advance. If you don't receive a letter, please call our office to schedule the follow-up appointment.  If you need a refill on your cardiac medications before your next appointment, please call your pharmacy. Signed, Sanda Klein, MD  12/12/2015 9:54 AM    [facility] [place], [place], [place]  [address] Phone: [phone]; Fax: [phone]

## 2015-12-12 NOTE — Therapy (Signed)
[place], [place], [address] Phone: [phone]   Fax:  [phone]  Occupational Therapy Treatment  Patient Details  Name: Jerry Cain MRN: [id number] Date of Birth: [birth year] Referring Provider: Dr. Octavio Graves, DO  Encounter Date: 12/12/2015      OT End of Session - 12/12/15 1413    Visit Number 6   Number of Visits 8   Date for OT Re-Evaluation 12/30/15   Authorization Type [employer]   Authorization Time Period before 10th visit   Authorization - Visit Number 6   Authorization - Number of Visits 10   OT Start Time 1122   OT Stop Time 1202   OT Time Calculation (min) 40 min   Activity Tolerance Patient tolerated treatment well   Behavior During Therapy [facility] for tasks assessed/performed      Past Medical History:  Diagnosis Date  . Adenomatous colon polyp 2001  . Anxiety   . Atrial fibrillation ([place])    takes Coumadin daily  . BPH (benign prostatic hyperplasia)    takes Proscar daily  . CAD (coronary artery disease) 2009   cardiac cath and PTCA by Dr. Tami Ribas  . Carpal tunnel syndrome    right  . Complication of anesthesia    hard to wake up  . Diverticulosis 2007   tcs by Dr. Laural Golden  . GERD (gastroesophageal reflux disease)   . Gout   . Hypercholesterolemia    takes Atorvastatin daily  . Hypertension    takes Imdur,Coreg,and Lisinopril daily  . Hypothyroidism    takes SYnthroid daily  . Joint pain   . Joint swelling   . Nocturia   . Numbness    right hand pointer and middle finger  . Osteoarthritis    left knee  . Pneumonia    many,many yrs ago  . Tubular adenoma 2001    Past Surgical History:  Procedure Laterality Date  . Barbie Banner OSTEOTOMY Right 03/17/2013   Procedure: Barbie Banner OSTEOTOMY;  Surgeon: Marcheta Grammes, DPM;  [facility]: [facility];  Service: Orthopedics;  Laterality: Right;  . BUNIONECTOMY Right 03/17/2013   Procedure: BUNIONECTOMY, Arthroplasty  2nd toe right foot;  Surgeon: Marcheta Grammes, DPM;  Location: [facility];  Service: Orthopedics;  Laterality: Right;  . CARDIAC CATHETERIZATION  06/11/2007   PTCA X2 in 06/2007:  2 overlapping Cypher stents to LAD (2.5x13 and 3.0x23)  . CARDIAC CATHETERIZATION  06/21/2007   Cypher stent 2.5 x 13  to OM branch  . CARDIOVERSION  12/12/2011   Procedure: CARDIOVERSION;  Surgeon: Sanda Klein, MD;  [facility];  Service: Cardiovascular;  Laterality: N/A;  . CATARACT EXTRACTION     bilateral  . COLONOSCOPY  2007   Dr. Laural Golden- L sided diverticulosis. Next TCS 2012 due to h/o tubular adenoma.  . COLONOSCOPY  02/26/2011   Procedure: COLONOSCOPY;  Surgeon: Daneil Dolin, MD;  [facility];  Service: Endoscopy;  Laterality: N/A;  11:05  . CORONARY ANGIOPLASTY     x 3   . FLEXOR TENOTOMY Left 03/17/2013   Procedure: PERCUTANEOUS FLEXOR TENOTOMY 3RD TOE LEFT FOOT;  Surgeon: Marcheta Grammes, DPM;  Location: [facility];  Service: Orthopedics;  Laterality: Left;  . HERNIA REPAIR  2009   left inguinal  . rot Left   . TEE WITHOUT CARDIOVERSION  12/12/2011   Procedure: TRANSESOPHAGEAL ECHOCARDIOGRAM (TEE);  Surgeon: Sanda Klein, MD;  Location: [facility];  Service: Cardiovascular;  Laterality: N/A;  successful/sinus rhythm  . TOTAL KNEE ARTHROPLASTY Left 06/14/2013   Procedure: TOTAL KNEE ARTHROPLASTY;  Surgeon: Garald Balding, MD;  Location: [facility];  Service: Orthopedics;  Laterality: Left;    There were no vitals filed for this visit.      Subjective Assessment - 12/12/15 1409    Subjective  S:  I havent been able to do alot of my exercises because I havent been feeling well.    Currently in Pain? Yes   Pain Score 2    Pain Location Shoulder   Pain Orientation Left   Pain Descriptors / Indicators Aching   Pain Type Acute pain   Pain Onset More than a month ago   Pain Frequency Intermittent   Aggravating Factors  reaching overhead   Pain Relieving Factors  rest   Effect of Pain on Daily Activities difficulty reaching to end range overhead.            [facility] OT Assessment - 12/12/15 0001      Assessment   Diagnosis Left Rotator Cuff Impingement Syndrome                  OT Treatments/Exercises (OP) - 12/12/15 0001      Exercises   Exercises Shoulder     Shoulder Exercises: Supine   Protraction PROM;5 reps;Strengthening;10 reps   Protraction Weight (lbs) 1   Horizontal ABduction PROM;5 reps;Strengthening;10 reps   Horizontal ABduction Weight (lbs) 1   External Rotation PROM;5 reps;Strengthening;10 reps   External Rotation Weight (lbs) 1   Internal Rotation PROM;5 reps;Strengthening;10 reps   Internal Rotation Weight (lbs) 1   Flexion PROM;5 reps;Strengthening;10 reps   Shoulder Flexion Weight (lbs) 1   ABduction PROM;5 reps;Strengthening;10 reps   Shoulder ABduction Weight (lbs) 1     Manual Therapy   Manual Therapy Myofascial release   Manual therapy comments Manual therapy completed prior to ROM measurements and treatment exercises.   Myofascial Release Myofascial release and manual stretching to left upper arm, shoulder, scapular, and cervical regions to decrease restrictions and improve pain free mobility                  OT Short Term Goals - 11/07/15 1409      OT SHORT TERM GOAL #1   Title Patient will be education and independent with HEP for improved functional use of left arm.   Time 4   Period Weeks   Status On-going     OT SHORT TERM GOAL #2   Title Patient will increase left shoulder P/ROM to [facility] to increase ability to dry his back and hang up his towel.   Time 4   Period Weeks   Status On-going     OT SHORT TERM GOAL #3   Title Patient will increase left shoulder strength to 4+/5 to increase ability to pick up and stabalize household items.   Time 4   Period Weeks   Status On-going     OT SHORT TERM GOAL #4   Title Patient will decrease pain in his left shoulder region to 3/10  or better when sleeping at night.    Time 4   Period Weeks   Status On-going     OT SHORT TERM GOAL #5   Title Patient will decrease left shoulder fascial restrictions to min-mod for improved mobility needed to reach overhead.   Time 4   Period Weeks   Status New  OT Long Term Goals - 11/07/15 1410      OT LONG TERM GOAL #1   Title Patient will return to highest level of independence with all daily tasks using LUE.   Time 8   Period Weeks   Status On-going     OT LONG TERM GOAL #2   Title Patient will increase AROM in left shoulder  to WNL to increase ability to hang up his towel, reach overhead, and complete gardening and canning activities.    Time 8   Period Weeks   Status On-going     OT LONG TERM GOAL #3   Title Patient will increase left shoulder strength to 5/5 for improved abilty to lift items onto overhead shelves and pick up gardening supplies.    Time 8   Period Weeks   Status On-going     OT LONG TERM GOAL #4   Title Patient will decrease fascial restrictions to trace amount in his left shoulder region in order to improve mobility needed to complete daily tasks.    Time 8   Period Weeks   Status On-going     OT LONG TERM GOAL #5   Title Patient will decrease pain level to 1/10 in his left shoulder  when completing daily tasks.   Time 8   Period Weeks   Status On-going               Plan - 12/12/15 1413    Clinical Impression Statement A:  Patient has fatigue and difficulty reaching overhead at end range.     Plan P:  improve abilty to reach overhead with resistance during functional activities.       Patient will benefit from skilled therapeutic intervention in order to improve the following deficits and impairments:  Decreased range of motion, Decreased strength, Increased fascial restricitons, Impaired UE functional use, Increased muscle spasms, Pain  Visit Diagnosis: Stiffness of left shoulder, not elsewhere classified  Pain  in left shoulder  Other symptoms and signs involving the musculoskeletal system    Problem List Patient Active Problem List   Diagnosis Date Noted  . 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 Arthritis of knee, left 06/16/2013  . S/P total knee replacement using cement 06/14/2013  . Osteoarthritis of left knee 03/21/2013  . BPH (benign prostatic hyperplasia)   . GERD (gastroesophageal reflux disease)   . CAD (coronary artery disease)   . Fever 03/20/2013  . Altered mental state 03/20/2013  . Postoperative fever 03/20/2013  . PNA (pneumonia) 03/20/2013  . Preop cardiovascular exam 11/24/2012  . Peroneal tendonitis 10/05/2012  . Arthritis of foot, degenerative 10/05/2012  . HTN (hypertension) 09/25/2012  . Hyperlipidemia 09/25/2012  . Atrial fibrillation ([place]) 06/15/2012  . Long term (current) use of anticoagulants 06/15/2012  . Rotator cuff strain 02/04/2012  . Shoulder pain 02/04/2012  . Personal history of colonic polyps 01/29/2011  . High risk medication use 01/29/2011    Vangie Bicker, [place], OTR/L [phone]  12/12/2015, 2:15 PM  [place] [address] [place], [place], [address] Phone: [phone]   Fax:  [phone]  Name: KIRKLYN CUOCO MRN: [id number] Date of Birth: 30-Aug-1931

## 2015-12-17 DIAGNOSIS — E039 Hypothyroidism, unspecified: Secondary | ICD-10-CM | POA: Diagnosis not present

## 2015-12-17 DIAGNOSIS — B351 Tinea unguium: Secondary | ICD-10-CM | POA: Diagnosis not present

## 2015-12-17 DIAGNOSIS — E785 Hyperlipidemia, unspecified: Secondary | ICD-10-CM | POA: Diagnosis not present

## 2015-12-17 DIAGNOSIS — I482 Chronic atrial fibrillation: Secondary | ICD-10-CM | POA: Diagnosis not present

## 2015-12-17 DIAGNOSIS — I10 Essential (primary) hypertension: Secondary | ICD-10-CM | POA: Diagnosis not present

## 2015-12-18 ENCOUNTER — Ambulatory Visit (INDEPENDENT_AMBULATORY_CARE_PROVIDER_SITE_OTHER): Payer: PPO | Admitting: *Deleted

## 2015-12-18 DIAGNOSIS — Z7901 Long term (current) use of anticoagulants: Secondary | ICD-10-CM | POA: Diagnosis not present

## 2015-12-18 DIAGNOSIS — I4891 Unspecified atrial fibrillation: Secondary | ICD-10-CM | POA: Diagnosis not present

## 2015-12-18 LAB — POCT INR: INR: 3.2

## 2015-12-19 ENCOUNTER — Ambulatory Visit (HOSPITAL_COMMUNITY): Payer: PPO | Admitting: Occupational Therapy

## 2015-12-19 ENCOUNTER — Encounter (HOSPITAL_COMMUNITY): Payer: Self-pay | Admitting: Occupational Therapy

## 2015-12-19 DIAGNOSIS — R29898 Other symptoms and signs involving the musculoskeletal system: Secondary | ICD-10-CM

## 2015-12-19 DIAGNOSIS — M25612 Stiffness of left shoulder, not elsewhere classified: Secondary | ICD-10-CM

## 2015-12-19 DIAGNOSIS — M25512 Pain in left shoulder: Secondary | ICD-10-CM

## 2015-12-19 NOTE — Therapy (Signed)
[place], [place], [address] Phone: [phone]   Fax:  [phone]  Occupational Therapy Treatment  Patient Details  Name: Jerry Cain MRN: [id number] Date of Birth: [birth year] Referring Provider: Dr. Octavio Graves, DO  Encounter Date: 12/19/2015      OT End of Session - 12/19/15 1353    Visit Number 7   Number of Visits 8   Date for OT Re-Evaluation 12/30/15   Authorization Type [employer]   Authorization Time Period before 10th visit   Authorization - Visit Number 7   Authorization - Number of Visits 10   OT Start Time 1302   OT Stop Time 1347   OT Time Calculation (min) 45 min   Activity Tolerance Patient tolerated treatment well   Behavior During Therapy [facility] for tasks assessed/performed      Past Medical History:  Diagnosis Date  . Adenomatous colon polyp 2001  . Anxiety   . Atrial fibrillation ([place])    takes Coumadin daily  . BPH (benign prostatic hyperplasia)    takes Proscar daily  . CAD (coronary artery disease) 2009   cardiac cath and PTCA by Dr. Tami Ribas  . Carpal tunnel syndrome    right  . Complication of anesthesia    hard to wake up  . Diverticulosis 2007   tcs by Dr. Laural Golden  . GERD (gastroesophageal reflux disease)   . Gout   . Hypercholesterolemia    takes Atorvastatin daily  . Hypertension    takes Imdur,Coreg,and Lisinopril daily  . Hypothyroidism    takes SYnthroid daily  . Joint pain   . Joint swelling   . Nocturia   . Numbness    right hand pointer and middle finger  . Osteoarthritis    left knee  . Pneumonia    many,many yrs ago  . Tubular adenoma 2001    Past Surgical History:  Procedure Laterality Date  . Barbie Banner OSTEOTOMY Right 03/17/2013   Procedure: Barbie Banner OSTEOTOMY;  Surgeon: Marcheta Grammes, DPM;  [facility]: [facility];  Service: Orthopedics;  Laterality: Right;  . BUNIONECTOMY Right 03/17/2013   Procedure: BUNIONECTOMY, Arthroplasty  2nd toe right foot;  Surgeon: Marcheta Grammes, DPM;  Location: [facility];  Service: Orthopedics;  Laterality: Right;  . CARDIAC CATHETERIZATION  06/11/2007   PTCA X2 in 06/2007:  2 overlapping Cypher stents to LAD (2.5x13 and 3.0x23)  . CARDIAC CATHETERIZATION  06/21/2007   Cypher stent 2.5 x 13  to OM branch  . CARDIOVERSION  12/12/2011   Procedure: CARDIOVERSION;  Surgeon: Sanda Klein, MD;  [facility];  Service: Cardiovascular;  Laterality: N/A;  . CATARACT EXTRACTION     bilateral  . COLONOSCOPY  2007   Dr. Laural Golden- L sided diverticulosis. Next TCS 2012 due to h/o tubular adenoma.  . COLONOSCOPY  02/26/2011   Procedure: COLONOSCOPY;  Surgeon: Daneil Dolin, MD;  [facility];  Service: Endoscopy;  Laterality: N/A;  11:05  . CORONARY ANGIOPLASTY     x 3   . FLEXOR TENOTOMY Left 03/17/2013   Procedure: PERCUTANEOUS FLEXOR TENOTOMY 3RD TOE LEFT FOOT;  Surgeon: Marcheta Grammes, DPM;  Location: [facility];  Service: Orthopedics;  Laterality: Left;  . HERNIA REPAIR  2009   left inguinal  . rot Left   . TEE WITHOUT CARDIOVERSION  12/12/2011   Procedure: TRANSESOPHAGEAL ECHOCARDIOGRAM (TEE);  Surgeon: Sanda Klein, MD;  Location: [place];  Service: Cardiovascular;  Laterality: N/A;  successful/sinus rhythm  . TOTAL KNEE ARTHROPLASTY Left 06/14/2013   Procedure: TOTAL KNEE ARTHROPLASTY;  Surgeon: Garald Balding, MD;  Location: [place];  Service: Orthopedics;  Laterality: Left;    There were no vitals filed for this visit.      Subjective Assessment - 12/19/15 1304    Subjective  S: I couldn't hardly do my exercises yesterday, I was so sore.    Currently in Pain? No/denies            [facility] OT Assessment - 12/19/15 1303      Assessment   Diagnosis Left Rotator Cuff Impingement Syndrome     Precautions   Precautions None                  OT Treatments/Exercises (OP) - 12/19/15 1304      Exercises   Exercises Shoulder      Shoulder Exercises: Supine   Protraction PROM;5 reps;Strengthening;10 reps   Protraction Weight (lbs) 1   Horizontal ABduction PROM;5 reps;Strengthening;10 reps   Horizontal ABduction Weight (lbs) 1   External Rotation PROM;5 reps;Strengthening;10 reps   External Rotation Weight (lbs) 1   Internal Rotation PROM;5 reps;Strengthening;10 reps   Internal Rotation Weight (lbs) 1   Flexion PROM;5 reps;Strengthening;10 reps   Shoulder Flexion Weight (lbs) 1   ABduction PROM;5 reps;Strengthening;10 reps   Shoulder ABduction Weight (lbs) 1     Shoulder Exercises: Seated   Extension Theraband;15 reps   Theraband Level (Shoulder Extension) Level 2 (Red)   Retraction Theraband;15 reps   Theraband Level (Shoulder Retraction) Level 2 (Red)   Row Theraband;15 reps   Theraband Level (Shoulder Row) Level 2 (Red)   Horizontal ABduction Theraband;10 reps   Theraband Level (Shoulder Horizontal ABduction) Level 2 (Red)   External Rotation Theraband;10 reps   Theraband Level (Shoulder External Rotation) Level 2 (Red)   Internal Rotation Theraband;10 reps   Theraband Level (Shoulder Internal Rotation) Level 2 (Red)   Flexion Theraband;10 reps   Theraband Level (Shoulder Flexion) Level 2 (Red)   Abduction Theraband;10 reps   Theraband Level (Shoulder ABduction) Level 2 (Red)     Shoulder Exercises: ROM/Strengthening   UBE (Upper Arm Bike) 3' forward and 3' reverse at 1.0   Proximal Shoulder Strengthening, Supine 10X each with 1# weight, no rest breaks   Ball on Wall 1' in flexion and 1' abduction      Manual Therapy   Manual Therapy Myofascial release   Manual therapy comments Manual therapy completed prior to ROM measurements and treatment exercises.   Myofascial Release Myofascial release and manual stretching to left upper arm, shoulder, scapular, and cervical regions to decrease restrictions and improve pain free mobility                  OT Short Term Goals - 11/07/15 1409       OT SHORT TERM GOAL #1   Title Patient will be education and independent with HEP for improved functional use of left arm.   Time 4   Period Weeks   Status On-going     OT SHORT TERM GOAL #2   Title Patient will increase left shoulder P/ROM to [facility] to increase ability to dry his back and hang up his towel.   Time 4   Period Weeks   Status On-going     OT SHORT TERM GOAL #3   Title Patient will increase left shoulder strength to 4+/5 to increase ability to pick up and stabalize household items.  Time 4   Period Weeks   Status On-going     OT SHORT TERM GOAL #4   Title Patient will decrease pain in his left shoulder region to 3/10 or better when sleeping at night.    Time 4   Period Weeks   Status On-going     OT SHORT TERM GOAL #5   Title Patient will decrease left shoulder fascial restrictions to min-mod for improved mobility needed to reach overhead.   Time 4   Period Weeks   Status New           OT Long Term Goals - 11/07/15 1410      OT LONG TERM GOAL #1   Title Patient will return to highest level of independence with all daily tasks using LUE.   Time 8   Period Weeks   Status On-going     OT LONG TERM GOAL #2   Title Patient will increase AROM in left shoulder  to WNL to increase ability to hang up his towel, reach overhead, and complete gardening and canning activities.    Time 8   Period Weeks   Status On-going     OT LONG TERM GOAL #3   Title Patient will increase left shoulder strength to 5/5 for improved abilty to lift items onto overhead shelves and pick up gardening supplies.    Time 8   Period Weeks   Status On-going     OT LONG TERM GOAL #4   Title Patient will decrease fascial restrictions to trace amount in his left shoulder region in order to improve mobility needed to complete daily tasks.    Time 8   Period Weeks   Status On-going     OT LONG TERM GOAL #5   Title Patient will decrease pain level to 1/10 in his left shoulder  when  completing daily tasks.   Time 8   Period Weeks   Status On-going               Plan - 12/19/15 1354    Clinical Impression Statement A: Continued with strengthening with weights and theraband. Pt reports soreness yesterday and this morning, good response to myofascial release. Able to complete exercises with minimal rest breaks, verbal cuing for form.    Rehab Potential Good   OT Frequency 1x / week   OT Duration 8 weeks   OT Treatment/Interventions Self-care/ADL training;Cryotherapy;Electrical Stimulation;Moist Heat;Iontophoresis;Ultrasound;Therapeutic exercise;Neuromuscular education;Energy conservation;DME and/or AE instruction;Passive range of motion;Manual Therapy;Therapeutic exercises;Therapeutic activities;Patient/family education   Plan P: Continue working to improve ability to reach overhead with functional tasks, add overhead lacing activity   Consulted and Agree with Plan of Care Patient      Patient will benefit from skilled therapeutic intervention in order to improve the following deficits and impairments:  Decreased range of motion, Decreased strength, Increased fascial restricitons, Impaired UE functional use, Increased muscle spasms, Pain  Visit Diagnosis: Stiffness of left shoulder, not elsewhere classified  Pain in left shoulder  Other symptoms and signs involving the musculoskeletal system    Problem List Patient Active Problem List   Diagnosis Date Noted  . 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 Arthritis of knee, left 06/16/2013  . S/P total knee replacement using cement 06/14/2013  . Osteoarthritis of left knee 03/21/2013  . BPH (benign prostatic hyperplasia)   . GERD (gastroesophageal reflux disease)   . CAD (coronary artery disease)   . Fever 03/20/2013  . Altered mental state 03/20/2013  . Postoperative fever  03/20/2013  . PNA (pneumonia) 03/20/2013  . Preop cardiovascular exam 11/24/2012  . Peroneal tendonitis 10/05/2012  . Arthritis of foot,  degenerative 10/05/2012  . HTN (hypertension) 09/25/2012  . Hyperlipidemia 09/25/2012  . Atrial fibrillation ([place]) 06/15/2012  . Long term (current) use of anticoagulants 06/15/2012  . Rotator cuff strain 02/04/2012  . Shoulder pain 02/04/2012  . Personal history of colonic polyps 01/29/2011  . High risk medication use 01/29/2011   Guadelupe Sabin, OTR/L  [phone] 12/19/2015, 1:56 PM  [place] [address] [place], [place], [address] Phone: [phone]   Fax:  [phone]  Name: Jerry Cain MRN: [id number] Date of Birth: 02/20/1932

## 2015-12-24 ENCOUNTER — Ambulatory Visit (INDEPENDENT_AMBULATORY_CARE_PROVIDER_SITE_OTHER): Payer: PPO | Admitting: *Deleted

## 2015-12-24 DIAGNOSIS — Z7901 Long term (current) use of anticoagulants: Secondary | ICD-10-CM | POA: Diagnosis not present

## 2015-12-24 DIAGNOSIS — I4891 Unspecified atrial fibrillation: Secondary | ICD-10-CM

## 2015-12-24 LAB — POCT INR: INR: 1.7

## 2015-12-26 ENCOUNTER — Ambulatory Visit (HOSPITAL_COMMUNITY): Payer: PPO | Admitting: Specialist

## 2016-01-02 ENCOUNTER — Encounter (HOSPITAL_COMMUNITY): Payer: PPO | Admitting: Occupational Therapy

## 2016-01-07 ENCOUNTER — Ambulatory Visit (INDEPENDENT_AMBULATORY_CARE_PROVIDER_SITE_OTHER): Payer: PPO | Admitting: *Deleted

## 2016-01-07 DIAGNOSIS — Z7901 Long term (current) use of anticoagulants: Secondary | ICD-10-CM | POA: Diagnosis not present

## 2016-01-07 DIAGNOSIS — I4891 Unspecified atrial fibrillation: Secondary | ICD-10-CM | POA: Diagnosis not present

## 2016-01-07 LAB — POCT INR: INR: 3.5

## 2016-01-09 ENCOUNTER — Ambulatory Visit (HOSPITAL_COMMUNITY): Payer: PPO | Attending: *Deleted | Admitting: Occupational Therapy

## 2016-01-09 ENCOUNTER — Encounter (HOSPITAL_COMMUNITY): Payer: Self-pay | Admitting: Occupational Therapy

## 2016-01-09 DIAGNOSIS — R29898 Other symptoms and signs involving the musculoskeletal system: Secondary | ICD-10-CM | POA: Diagnosis not present

## 2016-01-09 DIAGNOSIS — M25612 Stiffness of left shoulder, not elsewhere classified: Secondary | ICD-10-CM | POA: Diagnosis not present

## 2016-01-09 NOTE — Therapy (Signed)
[place], [place], [address] Phone: [phone]   Fax:  [phone]  Occupational Therapy Reassessment, Treatment, Recertification  Patient Details  Name: Jerry Cain MRN: [id number] Date of Birth: May 29, 1931 Referring Provider: Dr. Octavio Graves, DO  Encounter Date: 01/09/2016      OT End of Session - 01/09/16 1216    Visit Number 8   Number of Visits 12   Date for OT Re-Evaluation 02/06/16   Authorization Type [employer]   Authorization Time Period before 18th visit   Authorization - Visit Number 8   Authorization - Number of Visits 18   OT Start Time 1130  pt arrived late   OT Stop Time 1202   OT Time Calculation (min) 32 min   Activity Tolerance Patient tolerated treatment well   Behavior During Therapy [facility] for tasks assessed/performed      Past Medical History:  Diagnosis Date  . Adenomatous colon polyp 2001  . Anxiety   . Atrial fibrillation ([place])    takes Coumadin daily  . BPH (benign prostatic hyperplasia)    takes Proscar daily  . CAD (coronary artery disease) 2009   cardiac cath and PTCA by Dr. Tami Ribas  . Carpal tunnel syndrome    right  . Complication of anesthesia    hard to wake up  . Diverticulosis 2007   tcs by Dr. Laural Golden  . GERD (gastroesophageal reflux disease)   . Gout   . Hypercholesterolemia    takes Atorvastatin daily  . Hypertension    takes Imdur,Coreg,and Lisinopril daily  . Hypothyroidism    takes SYnthroid daily  . Joint pain   . Joint swelling   . Nocturia   . Numbness    right hand pointer and middle finger  . Osteoarthritis    left knee  . Pneumonia    many,many yrs ago  . Tubular adenoma 2001    Past Surgical History:  Procedure Laterality Date  . Barbie Banner OSTEOTOMY Right 03/17/2013   Procedure: Barbie Banner OSTEOTOMY;  Surgeon: Marcheta Grammes, DPM;  [facility];  Service: Orthopedics;  Laterality: Right;  . BUNIONECTOMY Right  03/17/2013   Procedure: BUNIONECTOMY, Arthroplasty 2nd toe right foot;  Surgeon: Marcheta Grammes, DPM;  [facility]: [facility];  Service: Orthopedics;  Laterality: Right;  . CARDIAC CATHETERIZATION  06/11/2007   PTCA X2 in 06/2007:  2 overlapping Cypher stents to LAD (2.5x13 and 3.0x23)  . CARDIAC CATHETERIZATION  06/21/2007   Cypher stent 2.5 x 13  to OM branch  . CARDIOVERSION  12/12/2011   Procedure: CARDIOVERSION;  Surgeon: Sanda Klein, MD;  [facility];  Service: Cardiovascular;  Laterality: N/A;  . CATARACT EXTRACTION     bilateral  . COLONOSCOPY  2007   Dr. Laural Golden- L sided diverticulosis. Next TCS 2012 due to h/o tubular adenoma.  . COLONOSCOPY  02/26/2011   Procedure: COLONOSCOPY;  Surgeon: Daneil Dolin, MD;  [facility];  Service: Endoscopy;  Laterality: N/A;  11:05  . CORONARY ANGIOPLASTY     x 3   . FLEXOR TENOTOMY Left 03/17/2013   Procedure: PERCUTANEOUS FLEXOR TENOTOMY 3RD TOE LEFT FOOT;  Surgeon: Marcheta Grammes, DPM;  [facility]: [facility];  Service: Orthopedics;  Laterality: Left;  . HERNIA REPAIR  2009   left inguinal  . rot Left   . TEE WITHOUT CARDIOVERSION  12/12/2011   Procedure: TRANSESOPHAGEAL ECHOCARDIOGRAM (TEE);  Surgeon: Sanda Klein, MD;  Location: [place];  Service:  Cardiovascular;  Laterality: N/A;   successful/sinus rhythm  . TOTAL KNEE ARTHROPLASTY Left 06/14/2013   Procedure: TOTAL KNEE ARTHROPLASTY;  Surgeon: Garald Balding, MD;  Location: [place];  Service: Orthopedics;  Laterality: Left;    There were no vitals filed for this visit.      Subjective Assessment - 01/09/16 1135    Subjective  S: I still have some soreness in that arm sometimes.    Special Tests FOTO Score: 65/100   Currently in Pain? No/denies           [facility] OT Assessment - 01/09/16 1130      Assessment   Diagnosis Left Rotator Cuff Impingement Syndrome     Precautions   Precautions None     Palpation   Palpation comment min/mod  fascial restrictions noted in left shoulder and scapular region      AROM   Overall AROM Comments Assessed seated, ER/IR adducted   AROM Assessment Site Shoulder   Right/Left Shoulder Left   Left Shoulder Flexion 135 Degrees  100 previous   Left Shoulder ABduction 151 Degrees  100 previous   Left Shoulder Internal Rotation 90 Degrees  84 previous   Left Shoulder External Rotation 59 Degrees  50 previous     PROM   Overall PROM Comments Assessed supine, ER/IR adducted   PROM Assessment Site Shoulder   Right/Left Shoulder Left   Left Shoulder Flexion 157 Degrees  130 previous   Left Shoulder ABduction 165 Degrees  115 previous   Left Shoulder Internal Rotation 90 Degrees  84 previus   Left Shoulder External Rotation 90 Degrees  60 previous     Strength   Overall Strength Comments Assessed seated, ER/IR adducted   Strength Assessment Site Shoulder   Right/Left Shoulder Left   Left Shoulder Flexion 4+/5  4/5 previous   Left Shoulder ABduction 4/5  same as previous   Left Shoulder Internal Rotation 4+/5  4/5 previous   Left Shoulder External Rotation 4/5  same as previous                  OT Treatments/Exercises (OP) - 01/09/16 1135      Exercises   Exercises Shoulder     Shoulder Exercises: Supine   Protraction PROM;5 reps   Horizontal ABduction PROM;5 reps   External Rotation PROM;5 reps   Internal Rotation PROM;5 reps   Flexion PROM;5 reps   ABduction PROM;5 reps     Shoulder Exercises: Standing   Protraction Theraband;10 reps   Theraband Level (Shoulder Protraction) Level 3 (Green)   Horizontal ABduction Theraband;10 reps   Theraband Level (Shoulder Horizontal ABduction) Level 3 (Green)   External Rotation Theraband;10 reps   Theraband Level (Shoulder External Rotation) Level 3 (Green)   Internal Rotation Theraband;10 reps   Theraband Level (Shoulder Internal Rotation) Level 3 (Green)   Flexion Theraband;10 reps   Theraband Level (Shoulder  Flexion) Level 3 (Green)   Flexion Limitations 50% range   ABduction Theraband;10 reps   Theraband Level (Shoulder ABduction) Level 3 (Green)   ABduction Limitations 50% range     Manual Therapy   Manual Therapy Myofascial release   Manual therapy comments Manual therapy completed prior to ROM measurements and treatment exercises.   Myofascial Release Myofascial release and manual stretching to left upper arm, shoulder, scapular, and cervical regions to decrease restrictions and improve pain free mobility                 OT Short  Term Goals - 01/09/16 1150      OT SHORT TERM GOAL #1   Title Patient will be education and independent with HEP for improved functional use of left arm.   Time 4   Period Weeks   Status Achieved     OT SHORT TERM GOAL #2   Title Patient will increase left shoulder P/ROM to [facility] to increase ability to dry his back and hang up his towel.   Time 4   Period Weeks   Status Achieved     OT SHORT TERM GOAL #3   Title Patient will increase left shoulder strength to 4+/5 to increase ability to pick up and stabalize household items.   Time 4   Period Weeks   Status Partially Met     OT SHORT TERM GOAL #4   Title Patient will decrease pain in his left shoulder region to 3/10 or better when sleeping at night.    Time 4   Period Weeks   Status Achieved     OT SHORT TERM GOAL #5   Title Patient will decrease left shoulder fascial restrictions to min-mod for improved mobility needed to reach overhead.   Time 4   Period Weeks   Status Achieved           OT Long Term Goals - 01/09/16 1151      OT LONG TERM GOAL #1   Title Patient will return to highest level of independence with all daily tasks using LUE.   Time 8   Period Weeks   Status On-going     OT LONG TERM GOAL #2   Title Patient will increase AROM in left shoulder  to WNL to increase ability to hang up his towel, reach overhead, and complete gardening and canning activities.     Time 8   Period Weeks   Status On-going     OT LONG TERM GOAL #3   Title Patient will increase left shoulder strength to 5/5 for improved abilty to lift items onto overhead shelves and pick up gardening supplies.    Time 8   Period Weeks   Status On-going     OT LONG TERM GOAL #4   Title Patient will decrease fascial restrictions to trace amount in his left shoulder region in order to improve mobility needed to complete daily tasks.    Time 8   Period Weeks   Status On-going     OT LONG TERM GOAL #5   Title Patient will decrease pain level to 1/10 in his left shoulder  when completing daily tasks.   Time 8   Period Weeks   Status On-going               Plan - 01/09/16 1216    Clinical Impression Statement A: Reassessment completed today, pt has met 4/5 STGs and partially met 1/5 STGs. Pt has made significant progress in improving ROM, strength, and functional use of LUE. Pt reports he continues to have deficits with reaching overhead and during strength tasks such as reaching for and lifting weighted objects. Discussed progress with pt, pt agreeable to continuing OT 1x/week for an additonal 4 weeks to focus on strengthening LUE to improve functional task performance.    Rehab Potential Good   OT Frequency 1x / week   OT Duration 4 weeks   OT Treatment/Interventions Self-care/ADL training;Cryotherapy;Electrical Stimulation;Moist Heat;Iontophoresis;Ultrasound;Therapeutic exercise;Neuromuscular education;Energy conservation;DME and/or AE instruction;Passive range of motion;Manual Therapy;Therapeutic exercises;Therapeutic activities;Patient/family education   Plan P:  continue OT 1x/week for 4 additional weeks focusing on strengthening LUE for use during functional tasks. Next session: add overhead lacing, functional reaching with weights   Consulted and Agree with Plan of Care Patient      Patient will benefit from skilled therapeutic intervention in order to improve the  following deficits and impairments:  Decreased range of motion, Decreased strength, Increased fascial restricitons, Impaired UE functional use, Increased muscle spasms, Pain  Visit Diagnosis: Stiffness of left shoulder, not elsewhere classified  Other symptoms and signs involving the musculoskeletal system      G-Codes - 02/08/2016 30-Jun-1217    Functional Assessment Tool Used FOTO Score: 65/100 (35% impairment)   Functional Limitation Carrying, moving and handling objects   Carrying, Moving and Handling Objects Current Status (Y6378) At least 20 percent but less than 40 percent impaired, limited or restricted   Carrying, Moving and Handling Objects Goal Status (H8850) At least 1 percent but less than 20 percent impaired, limited or restricted      Problem List Patient Active Problem List   Diagnosis Date Noted  . 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 Arthritis of knee, left 06/16/2013  . S/P total knee replacement using cement 06/14/2013  . Osteoarthritis of left knee 03/21/2013  . BPH (benign prostatic hyperplasia)   . GERD (gastroesophageal reflux disease)   . CAD (coronary artery disease)   . Fever 03/20/2013  . Altered mental state 03/20/2013  . Postoperative fever 03/20/2013  . PNA (pneumonia) 03/20/2013  . Preop cardiovascular exam 11/24/2012  . Peroneal tendonitis 10/05/2012  . Arthritis of foot, degenerative 10/05/2012  . HTN (hypertension) 09/25/2012  . Hyperlipidemia 09/25/2012  . Atrial fibrillation ([place]) 06/15/2012  . Long term (current) use of anticoagulants 06/15/2012  . Rotator cuff strain 02/04/2012  . Shoulder pain 02/04/2012  . Personal history of colonic polyps 01/29/2011  . High risk medication use 01/29/2011   Guadelupe Sabin, OTR/L  [phone] 2016-02-08, 12:20 PM  [place] [address] [place], [place], [address] Phone: [phone]   Fax:  [phone]  Name: Jerry Cain MRN: [id number] Date of  Birth: [birth year]

## 2016-01-16 ENCOUNTER — Encounter (HOSPITAL_COMMUNITY): Payer: Self-pay | Admitting: Occupational Therapy

## 2016-01-16 ENCOUNTER — Ambulatory Visit (HOSPITAL_COMMUNITY): Payer: PPO | Admitting: Occupational Therapy

## 2016-01-16 DIAGNOSIS — M25612 Stiffness of left shoulder, not elsewhere classified: Secondary | ICD-10-CM

## 2016-01-16 DIAGNOSIS — R29898 Other symptoms and signs involving the musculoskeletal system: Secondary | ICD-10-CM

## 2016-01-16 NOTE — Therapy (Signed)
[place], [place], [address] Phone: [phone]   Fax:  [phone]  Occupational Therapy Treatment  Patient Details  Name: Jerry Cain MRN: [id number] Date of Birth: November 25, 1931 Referring Provider: Dr. Octavio Graves, DO  Encounter Date: 01/16/2016      OT End of Session - 01/16/16 1408    Visit Number 9   Number of Visits 12   Date for OT Re-Evaluation 02/06/16   Authorization Type [employer]   Authorization Time Period before 18th visit   Authorization - Visit Number 9   Authorization - Number of Visits 18   OT Start Time 1034   OT Stop Time 1115   OT Time Calculation (min) 41 min   Activity Tolerance Patient tolerated treatment well   Behavior During Therapy [facility] for tasks assessed/performed      Past Medical History:  Diagnosis Date  . Adenomatous colon polyp 2001  . Anxiety   . Atrial fibrillation ([place])    takes Coumadin daily  . BPH (benign prostatic hyperplasia)    takes Proscar daily  . CAD (coronary artery disease) 2009   cardiac cath and PTCA by Dr. Tami Ribas  . Carpal tunnel syndrome    right  . Complication of anesthesia    hard to wake up  . Diverticulosis 2007   tcs by Dr. Laural Golden  . GERD (gastroesophageal reflux disease)   . Gout   . Hypercholesterolemia    takes Atorvastatin daily  . Hypertension    takes Imdur,Coreg,and Lisinopril daily  . Hypothyroidism    takes SYnthroid daily  . Joint pain   . Joint swelling   . Nocturia   . Numbness    right hand pointer and middle finger  . Osteoarthritis    left knee  . Pneumonia    many,many yrs ago  . Tubular adenoma 2001    Past Surgical History:  Procedure Laterality Date  . Barbie Banner OSTEOTOMY Right 03/17/2013   Procedure: Barbie Banner OSTEOTOMY;  Surgeon: Marcheta Grammes, DPM;  [facility]: [facility];  Service: Orthopedics;  Laterality: Right;  . BUNIONECTOMY Right 03/17/2013   Procedure: BUNIONECTOMY,  Arthroplasty 2nd toe right foot;  Surgeon: Marcheta Grammes, DPM;  Location: [facility];  Service: Orthopedics;  Laterality: Right;  . CARDIAC CATHETERIZATION  06/11/2007   PTCA X2 in 06/2007:  2 overlapping Cypher stents to LAD (2.5x13 and 3.0x23)  . CARDIAC CATHETERIZATION  06/21/2007   Cypher stent 2.5 x 13  to OM branch  . CARDIOVERSION  12/12/2011   Procedure: CARDIOVERSION;  Surgeon: Sanda Klein, MD;  [facility];  Service: Cardiovascular;  Laterality: N/A;  . CATARACT EXTRACTION     bilateral  . COLONOSCOPY  2007   Dr. Laural Golden- L sided diverticulosis. Next TCS 2012 due to h/o tubular adenoma.  . COLONOSCOPY  02/26/2011   Procedure: COLONOSCOPY;  Surgeon: Daneil Dolin, MD;  [facility];  Service: Endoscopy;  Laterality: N/A;  11:05  . CORONARY ANGIOPLASTY     x 3   . FLEXOR TENOTOMY Left 03/17/2013   Procedure: PERCUTANEOUS FLEXOR TENOTOMY 3RD TOE LEFT FOOT;  Surgeon: Marcheta Grammes, DPM;  Location: [facility];  Service: Orthopedics;  Laterality: Left;  . HERNIA REPAIR  2009   left inguinal  . rot Left   . TEE WITHOUT CARDIOVERSION  12/12/2011   Procedure: TRANSESOPHAGEAL ECHOCARDIOGRAM (TEE);  Surgeon: Sanda Klein, MD;  Location: [place];  Service: Cardiovascular;  Laterality: N/A;  successful/sinus rhythm  . TOTAL KNEE ARTHROPLASTY Left 06/14/2013   Procedure: TOTAL KNEE ARTHROPLASTY;  Surgeon: Garald Balding, MD;  Location: [place];  Service: Orthopedics;  Laterality: Left;    There were no vitals filed for this visit.      Subjective Assessment - 01/16/16 1035    Subjective  S: my shoulder isn't hurting me at all today.    Currently in Pain? Yes   Pain Score 2    Pain Location Back   Pain Orientation Lower   Pain Descriptors / Indicators Aching   Pain Type Acute pain   Pain Radiating Towards n/a   Pain Onset Yesterday   Pain Frequency Intermittent   Aggravating Factors  slept on it wrong   Pain Relieving Factors rest   Effect  of Pain on Daily Activities none   Multiple Pain Sites No            [facility] OT Assessment - 01/16/16 1034      Assessment   Diagnosis Left Rotator Cuff Impingement Syndrome     Precautions   Precautions None                  OT Treatments/Exercises (OP) - 01/16/16 1037      Exercises   Exercises Shoulder     Shoulder Exercises: Supine   Protraction PROM;5 reps;Strengthening;10 reps   Protraction Weight (lbs) 1   Horizontal ABduction PROM;5 reps;Strengthening;10 reps   Horizontal ABduction Weight (lbs) 1   External Rotation PROM;5 reps;Strengthening;10 reps   External Rotation Weight (lbs) 1   Internal Rotation PROM;5 reps;Strengthening;10 reps   Internal Rotation Weight (lbs) 1   Flexion PROM;5 reps;Strengthening;10 reps   Shoulder Flexion Weight (lbs) 1   ABduction PROM;5 reps;Strengthening;10 reps   Shoulder ABduction Weight (lbs) 1     Shoulder Exercises: Sidelying   External Rotation Strengthening;5 reps   External Rotation Weight (lbs) 1   Internal Rotation Strengthening;10 reps   Internal Rotation Weight (lbs) 1   Flexion Strengthening;10 reps   Flexion Weight (lbs) 1   ABduction Strengthening;10 reps   ABduction Weight (lbs) 1   Other Sidelying Exercises protraction, 10X, 1# weight     Shoulder Exercises: Standing   Extension Theraband;10 reps   Theraband Level (Shoulder Extension) Level 4 (Blue)   Row Theraband;10 reps   Theraband Level (Shoulder Row) Level 4 (Blue)   Retraction Theraband;10 reps   Theraband Level (Shoulder Retraction) Level 4 (Blue)     Shoulder Exercises: ROM/Strengthening   Over Head Lace 1' with 1# weight   X to V Arms 10X with 1# weight   Proximal Shoulder Strengthening, Supine 10X each with 1# weight, no rest breaks   Ball on Wall 1' in flexion and 1' abduction      Manual Therapy   Manual Therapy Myofascial release   Manual therapy comments Manual therapy completed prior to ROM measurements and treatment  exercises.   Myofascial Release Myofascial release and manual stretching to left upper arm, shoulder, scapular, and cervical regions to decrease restrictions and improve pain free mobility                  OT Short Term Goals - 01/09/16 1150      OT SHORT TERM GOAL #1   Title Patient will be education and independent with HEP for improved functional use of left arm.   Time 4   Period Weeks   Status Achieved     OT SHORT TERM GOAL #  2   Title Patient will increase left shoulder P/ROM to [facility] to increase ability to dry his back and hang up his towel.   Time 4   Period Weeks   Status Achieved     OT SHORT TERM GOAL #3   Title Patient will increase left shoulder strength to 4+/5 to increase ability to pick up and stabalize household items.   Time 4   Period Weeks   Status Partially Met     OT SHORT TERM GOAL #4   Title Patient will decrease pain in his left shoulder region to 3/10 or better when sleeping at night.    Time 4   Period Weeks   Status Achieved     OT SHORT TERM GOAL #5   Title Patient will decrease left shoulder fascial restrictions to min-mod for improved mobility needed to reach overhead.   Time 4   Period Weeks   Status Achieved           OT Long Term Goals - 01/09/16 1151      OT LONG TERM GOAL #1   Title Patient will return to highest level of independence with all daily tasks using LUE.   Time 8   Period Weeks   Status On-going     OT LONG TERM GOAL #2   Title Patient will increase AROM in left shoulder  to WNL to increase ability to hang up his towel, reach overhead, and complete gardening and canning activities.    Time 8   Period Weeks   Status On-going     OT LONG TERM GOAL #3   Title Patient will increase left shoulder strength to 5/5 for improved abilty to lift items onto overhead shelves and pick up gardening supplies.    Time 8   Period Weeks   Status On-going     OT LONG TERM GOAL #4   Title Patient will decrease fascial  restrictions to trace amount in his left shoulder region in order to improve mobility needed to complete daily tasks.    Time 8   Period Weeks   Status On-going     OT LONG TERM GOAL #5   Title Patient will decrease pain level to 1/10 in his left shoulder  when completing daily tasks.   Time 8   Period Weeks   Status On-going               Plan - 01/16/16 1408    Clinical Impression Statement A: Added strengthening in sidelying, overhead lacing with 1# weight, increased scapular theraband to blue band per pt request as this is what pt is using at home. Pt with minimal soreness at anterior shoulder at end of session.    Plan P: Continue with strengthening, resume theraband strengthening with green band   Consulted and Agree with Plan of Care Patient      Patient will benefit from skilled therapeutic intervention in order to improve the following deficits and impairments:  Decreased range of motion, Decreased strength, Increased fascial restricitons, Impaired UE functional use, Increased muscle spasms, Pain  Visit Diagnosis: Stiffness of left shoulder, not elsewhere classified  Other symptoms and signs involving the musculoskeletal system    Problem List Patient Active Problem List   Diagnosis Date Noted  . 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 Arthritis of knee, left 06/16/2013  . S/P total knee replacement using cement 06/14/2013  . Osteoarthritis of left knee 03/21/2013  . BPH (benign prostatic hyperplasia)   .  GERD (gastroesophageal reflux disease)   . CAD (coronary artery disease)   . Fever 03/20/2013  . Altered mental state 03/20/2013  . Postoperative fever 03/20/2013  . PNA (pneumonia) 03/20/2013  . Preop cardiovascular exam 11/24/2012  . Peroneal tendonitis 10/05/2012  . Arthritis of foot, degenerative 10/05/2012  . HTN (hypertension) 09/25/2012  . Hyperlipidemia 09/25/2012  . Atrial fibrillation ([place]) 06/15/2012  . Long term (current) use of anticoagulants  06/15/2012  . Rotator cuff strain 02/04/2012  . Shoulder pain 02/04/2012  . Personal history of colonic polyps 01/29/2011  . High risk medication use 01/29/2011   Guadelupe Sabin, OTR/L  [phone] 01/16/2016, 2:10 PM  [place] [address] [place], [place], [address] Phone: [phone]   Fax:  [phone]  Name: CURRAN LENDERMAN MRN: [id number] Date of Birth: January 26, 1932

## 2016-01-22 DIAGNOSIS — E785 Hyperlipidemia, unspecified: Secondary | ICD-10-CM | POA: Diagnosis not present

## 2016-01-22 DIAGNOSIS — I2510 Atherosclerotic heart disease of native coronary artery without angina pectoris: Secondary | ICD-10-CM | POA: Diagnosis not present

## 2016-01-22 DIAGNOSIS — E039 Hypothyroidism, unspecified: Secondary | ICD-10-CM | POA: Diagnosis not present

## 2016-01-22 DIAGNOSIS — B351 Tinea unguium: Secondary | ICD-10-CM | POA: Diagnosis not present

## 2016-01-23 ENCOUNTER — Ambulatory Visit (HOSPITAL_COMMUNITY): Payer: PPO | Admitting: Occupational Therapy

## 2016-01-23 ENCOUNTER — Encounter (HOSPITAL_COMMUNITY): Payer: Self-pay | Admitting: Occupational Therapy

## 2016-01-23 DIAGNOSIS — R29898 Other symptoms and signs involving the musculoskeletal system: Secondary | ICD-10-CM

## 2016-01-23 DIAGNOSIS — M25612 Stiffness of left shoulder, not elsewhere classified: Secondary | ICD-10-CM | POA: Diagnosis not present

## 2016-01-23 NOTE — Therapy (Signed)
[place], [place], [address] Phone: [phone]   Fax:  [phone]  Occupational Therapy Treatment  Patient Details  Name: Jerry Cain MRN: [id number] Date of Birth: 1931-09-27 Referring Provider: Dr. Octavio Graves, DO  Encounter Date: 01/23/2016      OT End of Session - 01/23/16 1158    Visit Number 10   Number of Visits 12   Date for OT Re-Evaluation 02/06/16   Authorization Type [employer]   Authorization Time Period before 18th visit   Authorization - Visit Number 10   Authorization - Number of Visits 18   OT Start Time 1118   OT Stop Time 1158   OT Time Calculation (min) 40 min   Activity Tolerance Patient tolerated treatment well   Behavior During Therapy [facility] for tasks assessed/performed      Past Medical History:  Diagnosis Date  . Adenomatous colon polyp 2001  . Anxiety   . Atrial fibrillation ([place])    takes Coumadin daily  . BPH (benign prostatic hyperplasia)    takes Proscar daily  . CAD (coronary artery disease) 2009   cardiac cath and PTCA by Dr. Tami Ribas  . Carpal tunnel syndrome    right  . Complication of anesthesia    hard to wake up  . Diverticulosis 2007   tcs by Dr. Laural Golden  . GERD (gastroesophageal reflux disease)   . Gout   . Hypercholesterolemia    takes Atorvastatin daily  . Hypertension    takes Imdur,Coreg,and Lisinopril daily  . Hypothyroidism    takes SYnthroid daily  . Joint pain   . Joint swelling   . Nocturia   . Numbness    right hand pointer and middle finger  . Osteoarthritis    left knee  . Pneumonia    many,many yrs ago  . Tubular adenoma 2001    Past Surgical History:  Procedure Laterality Date  . Barbie Banner OSTEOTOMY Right 03/17/2013   Procedure: Barbie Banner OSTEOTOMY;  Surgeon: Marcheta Grammes, DPM;  [facility];  Service: Orthopedics;  Laterality: Right;  . BUNIONECTOMY Right 03/17/2013   Procedure: BUNIONECTOMY,  Arthroplasty 2nd toe right foot;  Surgeon: Marcheta Grammes, DPM;  [facility];  Service: Orthopedics;  Laterality: Right;  . CARDIAC CATHETERIZATION  06/11/2007   PTCA X2 in 06/2007:  2 overlapping Cypher stents to LAD (2.5x13 and 3.0x23)  . CARDIAC CATHETERIZATION  06/21/2007   Cypher stent 2.5 x 13  to OM branch  . CARDIOVERSION  12/12/2011   Procedure: CARDIOVERSION;  Surgeon: Sanda Klein, MD;  [facility];  Service: Cardiovascular;  Laterality: N/A;  . CATARACT EXTRACTION     bilateral  . COLONOSCOPY  2007   Dr. Laural Golden- L sided diverticulosis. Next TCS 2012 due to h/o tubular adenoma.  . COLONOSCOPY  02/26/2011   Procedure: COLONOSCOPY;  Surgeon: Daneil Dolin, MD;  [facility];  Service: Endoscopy;  Laterality: N/A;  11:05  . CORONARY ANGIOPLASTY     x 3   . FLEXOR TENOTOMY Left 03/17/2013   Procedure: PERCUTANEOUS FLEXOR TENOTOMY 3RD TOE LEFT FOOT;  Surgeon: Marcheta Grammes, DPM;  [facility];  Service: Orthopedics;  Laterality: Left;  . HERNIA REPAIR  2009   left inguinal  . rot Left   . TEE WITHOUT CARDIOVERSION  12/12/2011   Procedure: TRANSESOPHAGEAL ECHOCARDIOGRAM (TEE);  Surgeon: Sanda Klein, MD;  Location: [place];  Service: Cardiovascular;  Laterality: N/A;  successful/sinus rhythm  . TOTAL KNEE ARTHROPLASTY Left 06/14/2013   Procedure: TOTAL KNEE ARTHROPLASTY;  Surgeon: Garald Balding, MD;  Location: [place];  Service: Orthopedics;  Laterality: Left;    There were no vitals filed for this visit.      Subjective Assessment - 01/23/16 1122    Subjective  S: My shoulder has been feeling really good.    Currently in Pain? No/denies            [facility] OT Assessment - 01/23/16 1121      Assessment   Diagnosis Left Rotator Cuff Impingement Syndrome     Precautions   Precautions None                  OT Treatments/Exercises (OP) - 01/23/16 1134      Exercises   Exercises Shoulder     Shoulder  Exercises: Supine   Protraction PROM;5 reps;Strengthening;15 reps   Protraction Weight (lbs) 1   Horizontal ABduction PROM;5 reps;Strengthening;15 reps   Horizontal ABduction Weight (lbs) 1   External Rotation PROM;5 reps;Strengthening;15 reps   External Rotation Weight (lbs) 1   Internal Rotation PROM;5 reps;Strengthening;15 reps   Internal Rotation Weight (lbs) 1   Flexion PROM;5 reps;Strengthening;15 reps   Shoulder Flexion Weight (lbs) 1   ABduction PROM;5 reps;Strengthening;15 reps   Shoulder ABduction Weight (lbs) 1     Shoulder Exercises: Sidelying   External Rotation Strengthening;10 reps   External Rotation Weight (lbs) 1   Internal Rotation Strengthening;10 reps   Internal Rotation Weight (lbs) 1   Flexion Strengthening;10 reps   Flexion Weight (lbs) 1   ABduction Strengthening;10 reps   ABduction Weight (lbs) 1   Other Sidelying Exercises protraction, 10X, 1# weight     Shoulder Exercises: Standing   Protraction Theraband;10 reps   Theraband Level (Shoulder Protraction) Level 3 (Green)   Horizontal ABduction Theraband;10 reps   Theraband Level (Shoulder Horizontal ABduction) Level 3 (Green)   External Rotation Theraband;10 reps   Theraband Level (Shoulder External Rotation) Level 3 (Green)   Internal Rotation Theraband;10 reps   Theraband Level (Shoulder Internal Rotation) Level 3 (Green)   Flexion Theraband;10 reps   Theraband Level (Shoulder Flexion) Level 3 (Green)   Flexion Limitations 60% range   ABduction Theraband;10 reps   Theraband Level (Shoulder ABduction) Level 3 (Green)   ABduction Limitations 50% range     Shoulder Exercises: ROM/Strengthening   UBE (Upper Arm Bike) 3' forward and 3' reverse at 2.0   Cybex Press 1.5 plate;10 reps   Cybex Row 1.5 plate;10 reps   Proximal Shoulder Strengthening, Supine 10X each with 1# weight, no rest breaks     Manual Therapy   Manual Therapy Myofascial release   Manual therapy comments Manual therapy completed  prior to ROM measurements and treatment exercises.   Myofascial Release Myofascial release and manual stretching to left upper arm, shoulder, scapular, and cervical regions to decrease restrictions and improve pain free mobility                  OT Short Term Goals - 01/09/16 1150      OT SHORT TERM GOAL #1   Title Patient will be education and independent with HEP for improved functional use of left arm.   Time 4   Period Weeks   Status Achieved     OT SHORT TERM GOAL #2   Title Patient will increase left shoulder P/ROM to [facility] to increase ability to dry his back and  hang up his towel.   Time 4   Period Weeks   Status Achieved     OT SHORT TERM GOAL #3   Title Patient will increase left shoulder strength to 4+/5 to increase ability to pick up and stabalize household items.   Time 4   Period Weeks   Status Partially Met     OT SHORT TERM GOAL #4   Title Patient will decrease pain in his left shoulder region to 3/10 or better when sleeping at night.    Time 4   Period Weeks   Status Achieved     OT SHORT TERM GOAL #5   Title Patient will decrease left shoulder fascial restrictions to min-mod for improved mobility needed to reach overhead.   Time 4   Period Weeks   Status Achieved           OT Long Term Goals - 01/09/16 1151      OT LONG TERM GOAL #1   Title Patient will return to highest level of independence with all daily tasks using LUE.   Time 8   Period Weeks   Status On-going     OT LONG TERM GOAL #2   Title Patient will increase AROM in left shoulder  to WNL to increase ability to hang up his towel, reach overhead, and complete gardening and canning activities.    Time 8   Period Weeks   Status On-going     OT LONG TERM GOAL #3   Title Patient will increase left shoulder strength to 5/5 for improved abilty to lift items onto overhead shelves and pick up gardening supplies.    Time 8   Period Weeks   Status On-going     OT LONG TERM GOAL #4    Title Patient will decrease fascial restrictions to trace amount in his left shoulder region in order to improve mobility needed to complete daily tasks.    Time 8   Period Weeks   Status On-going     OT LONG TERM GOAL #5   Title Patient will decrease pain level to 1/10 in his left shoulder  when completing daily tasks.   Time 8   Period Weeks   Status On-going               Plan - 01/23/16 1217    Clinical Impression Statement A: Continued with strengthening exercises, increasing supine repetitions to 15, added cybex row and press this session. Pt with no pain during session, fatigue noted at end of session, rest breaks provided as needed. Pt requires intermittant verbal cuing for form.    Plan P: Continue with cybex row and press, functional reaching with weights      Patient will benefit from skilled therapeutic intervention in order to improve the following deficits and impairments:  Decreased range of motion, Decreased strength, Increased fascial restricitons, Impaired UE functional use, Increased muscle spasms, Pain  Visit Diagnosis: Stiffness of left shoulder, not elsewhere classified  Other symptoms and signs involving the musculoskeletal system    Problem List Patient Active Problem List   Diagnosis Date Noted  . 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 Arthritis of knee, left 06/16/2013  . S/P total knee replacement using cement 06/14/2013  . Osteoarthritis of left knee 03/21/2013  . BPH (benign prostatic hyperplasia)   . GERD (gastroesophageal reflux disease)   . CAD (coronary artery disease)   . Fever 03/20/2013  . Altered mental state 03/20/2013  . Postoperative fever 03/20/2013  .  PNA (pneumonia) 03/20/2013  . Preop cardiovascular exam 11/24/2012  . Peroneal tendonitis 10/05/2012  . Arthritis of foot, degenerative 10/05/2012  . HTN (hypertension) 09/25/2012  . Hyperlipidemia 09/25/2012  . Atrial fibrillation ([place]) 06/15/2012  . Long term (current) use  of anticoagulants 06/15/2012  . Rotator cuff strain 02/04/2012  . Shoulder pain 02/04/2012  . Personal history of colonic polyps 01/29/2011  . High risk medication use 01/29/2011   Guadelupe Sabin, OTR/L  [phone] 01/23/2016, 12:20 PM  [place] [address] [place], [place], [address] Phone: [phone]   Fax:  [phone]  Name: KILEY TORRENCE MRN: [id number] Date of Birth: [birth year]

## 2016-01-28 ENCOUNTER — Ambulatory Visit (INDEPENDENT_AMBULATORY_CARE_PROVIDER_SITE_OTHER): Payer: PPO | Admitting: *Deleted

## 2016-01-28 DIAGNOSIS — I4891 Unspecified atrial fibrillation: Secondary | ICD-10-CM

## 2016-01-28 DIAGNOSIS — Z7901 Long term (current) use of anticoagulants: Secondary | ICD-10-CM

## 2016-01-28 LAB — POCT INR: INR: 1.8

## 2016-01-30 ENCOUNTER — Ambulatory Visit (HOSPITAL_COMMUNITY): Payer: PPO | Admitting: Occupational Therapy

## 2016-01-30 ENCOUNTER — Encounter (HOSPITAL_COMMUNITY): Payer: Self-pay | Admitting: Occupational Therapy

## 2016-01-30 DIAGNOSIS — M25612 Stiffness of left shoulder, not elsewhere classified: Secondary | ICD-10-CM

## 2016-01-30 DIAGNOSIS — R29898 Other symptoms and signs involving the musculoskeletal system: Secondary | ICD-10-CM

## 2016-01-30 NOTE — Therapy (Signed)
[place], [place], [address] Phone: [phone]   Fax:  [phone]  Occupational Therapy Treatment  Patient Details  Name: Jerry Cain MRN: [id number] Date of Birth: 27-Jun-1931 Referring Provider: Dr. Octavio Graves, DO  Encounter Date: 01/30/2016      OT End of Session - 01/30/16 1117    Visit Number 11   Number of Visits 12   Date for OT Re-Evaluation 02/06/16   Authorization Type [employer]   Authorization Time Period before 18th visit   Authorization - Visit Number 11   Authorization - Number of Visits 18   OT Start Time 1033   OT Stop Time 1115   OT Time Calculation (min) 42 min   Activity Tolerance Patient tolerated treatment well   Behavior During Therapy [facility] for tasks assessed/performed      Past Medical History:  Diagnosis Date  . Adenomatous colon polyp 2001  . Anxiety   . Atrial fibrillation ([place])    takes Coumadin daily  . BPH (benign prostatic hyperplasia)    takes Proscar daily  . CAD (coronary artery disease) 2009   cardiac cath and PTCA by Dr. Tami Ribas  . Carpal tunnel syndrome    right  . Complication of anesthesia    hard to wake up  . Diverticulosis 2007   tcs by Dr. Laural Golden  . GERD (gastroesophageal reflux disease)   . Gout   . Hypercholesterolemia    takes Atorvastatin daily  . Hypertension    takes Imdur,Coreg,and Lisinopril daily  . Hypothyroidism    takes SYnthroid daily  . Joint pain   . Joint swelling   . Nocturia   . Numbness    right hand pointer and middle finger  . Osteoarthritis    left knee  . Pneumonia    many,many yrs ago  . Tubular adenoma 2001    Past Surgical History:  Procedure Laterality Date  . Barbie Banner OSTEOTOMY Right 03/17/2013   Procedure: Barbie Banner OSTEOTOMY;  Surgeon: Marcheta Grammes, DPM;  [facility]: [facility];  Service: Orthopedics;  Laterality: Right;  . BUNIONECTOMY Right 03/17/2013   Procedure: BUNIONECTOMY,  Arthroplasty 2nd toe right foot;  Surgeon: Marcheta Grammes, DPM;  Location: [facility];  Service: Orthopedics;  Laterality: Right;  . CARDIAC CATHETERIZATION  06/11/2007   PTCA X2 in 06/2007:  2 overlapping Cypher stents to LAD (2.5x13 and 3.0x23)  . CARDIAC CATHETERIZATION  06/21/2007   Cypher stent 2.5 x 13  to OM branch  . CARDIOVERSION  12/12/2011   Procedure: CARDIOVERSION;  Surgeon: Sanda Klein, MD;  [facility];  Service: Cardiovascular;  Laterality: N/A;  . CATARACT EXTRACTION     bilateral  . COLONOSCOPY  2007   Dr. Laural Golden- L sided diverticulosis. Next TCS 2012 due to h/o tubular adenoma.  . COLONOSCOPY  02/26/2011   Procedure: COLONOSCOPY;  Surgeon: Daneil Dolin, MD;  [facility];  Service: Endoscopy;  Laterality: N/A;  11:05  . CORONARY ANGIOPLASTY     x 3   . FLEXOR TENOTOMY Left 03/17/2013   Procedure: PERCUTANEOUS FLEXOR TENOTOMY 3RD TOE LEFT FOOT;  Surgeon: Marcheta Grammes, DPM;  Location: [facility];  Service: Orthopedics;  Laterality: Left;  . HERNIA REPAIR  2009   left inguinal  . rot Left   . TEE WITHOUT CARDIOVERSION  12/12/2011   Procedure: TRANSESOPHAGEAL ECHOCARDIOGRAM (TEE);  Surgeon: Sanda Klein, MD;  Location: [place];  Service: Cardiovascular;  Laterality: N/A;  successful/sinus rhythm  . TOTAL KNEE ARTHROPLASTY Left 06/14/2013   Procedure: TOTAL KNEE ARTHROPLASTY;  Surgeon: Garald Balding, MD;  Location: [place];  Service: Orthopedics;  Laterality: Left;    There were no vitals filed for this visit.      Subjective Assessment - 01/30/16 1035    Subjective  S: That dampness has been making my shoulder a little sore.    Currently in Pain? No/denies            [facility] OT Assessment - 01/30/16 1034      Assessment   Diagnosis Left Rotator Cuff Impingement Syndrome     Precautions   Precautions None                  OT Treatments/Exercises (OP) - 01/30/16 1036      Exercises   Exercises Shoulder      Shoulder Exercises: Supine   Protraction PROM;5 reps;Strengthening;15 reps   Protraction Weight (lbs) 1   Horizontal ABduction PROM;5 reps;Strengthening;15 reps   Horizontal ABduction Weight (lbs) 1   External Rotation PROM;5 reps;Strengthening;15 reps   External Rotation Weight (lbs) 1   Internal Rotation PROM;5 reps;Strengthening;15 reps   Internal Rotation Weight (lbs) 1   Flexion PROM;5 reps;Strengthening;15 reps   Shoulder Flexion Weight (lbs) 1   ABduction PROM;5 reps;Strengthening;15 reps   Shoulder ABduction Weight (lbs) 1     Shoulder Exercises: Sidelying   External Rotation Strengthening;10 reps   External Rotation Weight (lbs) 1   Internal Rotation Strengthening;10 reps   Internal Rotation Weight (lbs) 1   Flexion Strengthening;10 reps   Flexion Weight (lbs) 1   ABduction Strengthening;10 reps   ABduction Weight (lbs) 1   Other Sidelying Exercises protraction, 10X, 1# weight   Other Sidelying Exercises Horizontal abduction, 10X 1# weight     Shoulder Exercises: Standing   Protraction Theraband;10 reps   Theraband Level (Shoulder Protraction) Level 3 (Green)   Horizontal ABduction Theraband;10 reps   Theraband Level (Shoulder Horizontal ABduction) Level 3 (Green)   External Rotation Theraband;10 reps   Theraband Level (Shoulder External Rotation) Level 3 (Green)   Internal Rotation Theraband;10 reps   Theraband Level (Shoulder Internal Rotation) Level 3 (Green)   Flexion Theraband;10 reps   Theraband Level (Shoulder Flexion) Level 3 (Green)   Flexion Limitations 60% range     Shoulder Exercises: ROM/Strengthening   UBE (Upper Arm Bike) 3' forward and 3' reverse at 2.0   Cybex Press 2 plate;10 reps   Cybex Row 2 plate;10 reps   Proximal Shoulder Strengthening, Supine 10X each with 1# weight, no rest breaks     Manual Therapy   Manual Therapy Myofascial release   Manual therapy comments Manual therapy completed prior to ROM measurements and treatment  exercises.   Myofascial Release Myofascial release and manual stretching to left upper arm, shoulder, scapular, and cervical regions to decrease restrictions and improve pain free mobility                  OT Short Term Goals - 01/09/16 1150      OT SHORT TERM GOAL #1   Title Patient will be education and independent with HEP for improved functional use of left arm.   Time 4   Period Weeks   Status Achieved     OT SHORT TERM GOAL #2   Title Patient will increase left shoulder P/ROM to [facility] to increase ability to dry his back and hang up his towel.   Time  4   Period Weeks   Status Achieved     OT SHORT TERM GOAL #3   Title Patient will increase left shoulder strength to 4+/5 to increase ability to pick up and stabalize household items.   Time 4   Period Weeks   Status Partially Met     OT SHORT TERM GOAL #4   Title Patient will decrease pain in his left shoulder region to 3/10 or better when sleeping at night.    Time 4   Period Weeks   Status Achieved     OT SHORT TERM GOAL #5   Title Patient will decrease left shoulder fascial restrictions to min-mod for improved mobility needed to reach overhead.   Time 4   Period Weeks   Status Achieved           OT Long Term Goals - 01/09/16 1151      OT LONG TERM GOAL #1   Title Patient will return to highest level of independence with all daily tasks using LUE.   Time 8   Period Weeks   Status On-going     OT LONG TERM GOAL #2   Title Patient will increase AROM in left shoulder  to WNL to increase ability to hang up his towel, reach overhead, and complete gardening and canning activities.    Time 8   Period Weeks   Status On-going     OT LONG TERM GOAL #3   Title Patient will increase left shoulder strength to 5/5 for improved abilty to lift items onto overhead shelves and pick up gardening supplies.    Time 8   Period Weeks   Status On-going     OT LONG TERM GOAL #4   Title Patient will decrease fascial  restrictions to trace amount in his left shoulder region in order to improve mobility needed to complete daily tasks.    Time 8   Period Weeks   Status On-going     OT LONG TERM GOAL #5   Title Patient will decrease pain level to 1/10 in his left shoulder  when completing daily tasks.   Time 8   Period Weeks   Status On-going               Plan - 01/30/16 1117    Clinical Impression Statement A: Continued strengthening exercises, increased cybex row/press to 2#, pt required verbal cuing for form and technique intermittently. Pt provided with rest breaks as needed, did not complete functional reaching with weights due to time constraints.    Plan P: Reassess, update HEP, discharge pt.    Consulted and Agree with Plan of Care Patient      Patient will benefit from skilled therapeutic intervention in order to improve the following deficits and impairments:  Decreased range of motion, Decreased strength, Increased fascial restricitons, Impaired UE functional use, Increased muscle spasms, Pain  Visit Diagnosis: Stiffness of left shoulder, not elsewhere classified  Other symptoms and signs involving the musculoskeletal system    Problem List Patient Active Problem List   Diagnosis Date Noted  . 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 Arthritis of knee, left 06/16/2013  . S/P total knee replacement using cement 06/14/2013  . Osteoarthritis of left knee 03/21/2013  . BPH (benign prostatic hyperplasia)   . GERD (gastroesophageal reflux disease)   . CAD (coronary artery disease)   . Fever 03/20/2013  . Altered mental state 03/20/2013  . Postoperative fever 03/20/2013  . PNA (pneumonia) 03/20/2013  .  Preop cardiovascular exam 11/24/2012  . Peroneal tendonitis 10/05/2012  . Arthritis of foot, degenerative 10/05/2012  . HTN (hypertension) 09/25/2012  . Hyperlipidemia 09/25/2012  . Atrial fibrillation ([place]) 06/15/2012  . Long term (current) use of anticoagulants 06/15/2012  .  Rotator cuff strain 02/04/2012  . Shoulder pain 02/04/2012  . Personal history of colonic polyps 01/29/2011  . High risk medication use 01/29/2011   Guadelupe Sabin, OTR/L  [phone] 01/30/2016, 11:18 AM  [place] [address] [place], [place], [address] Phone: [phone]   Fax:  [phone]  Name: Jerry Cain MRN: [id number] Date of Birth: 02-Apr-1932

## 2016-02-06 ENCOUNTER — Ambulatory Visit (HOSPITAL_COMMUNITY): Payer: PPO | Attending: *Deleted | Admitting: Occupational Therapy

## 2016-02-06 ENCOUNTER — Encounter (HOSPITAL_COMMUNITY): Payer: Self-pay | Admitting: Occupational Therapy

## 2016-02-06 DIAGNOSIS — M25612 Stiffness of left shoulder, not elsewhere classified: Secondary | ICD-10-CM | POA: Diagnosis not present

## 2016-02-06 DIAGNOSIS — R29898 Other symptoms and signs involving the musculoskeletal system: Secondary | ICD-10-CM | POA: Insufficient documentation

## 2016-02-06 NOTE — Patient Instructions (Signed)
1) Shoulder flexion While standing with back to the door, holding Theraband at hand level, raise arm in front of you.  Keep elbow straight through entire movement.      2) Shoulder horizontal abduction with band Standing with a theraband anchored at chest height, begin with arm straight and some tension in the band. Move your arm out to your side (keeping straight the whole time) by retracting your shoulder blade towards your spine.  Retract the press and bring the affected arm back to midline.       3) Internal rotation with band Hold the theraband in the hand of the affected shoulder. Keeping the elbow flexed to 90 degrees and elbow tightly against your side, begin with the shoulder externally rotated and slight tension on the theraband. Internally rotate the shoulder until your hand contacts your abdomen as shown above       4) Shoulder external rotation with band Begin by holding a piece of theraband against your belly-button. While keeping that elbow bent to 90 degrees, externally rotate your shoulder as far as comfortable while keeping your elbow pinned to your side     5) Shoulder Protraction Hold band in left hand. Start with elbow bent and close to side, with your shoulders relaxed. Push left arm out in front of you. Give an extra push out, as if trying to pull your shoulder blades apart. Slowly return to start position.

## 2016-02-06 NOTE — Therapy (Signed)
[place], [place], [address] Phone: [phone]   Fax:  [phone]  Occupational Therapy Reassessment, Treatment, and Discharge Summary  Patient Details  Name: SHARBEL SAHAGUN MRN: [id number] Date of Birth: 1931-05-12 Referring Provider: Dr. Octavio Graves, DO  Encounter Date: 02/06/2016      OT End of Session - 02/06/16 1149    Visit Number 12   Number of Visits 12   Date for OT Re-Evaluation 02/06/16   Authorization Type [employer]   Authorization Time Period before 18th visit   Authorization - Visit Number 12   Authorization - Number of Visits 18   OT Start Time 5830   OT Stop Time 1115   OT Time Calculation (min) 44 min   Activity Tolerance Patient tolerated treatment well   Behavior During Therapy [facility] for tasks assessed/performed      Past Medical History:  Diagnosis Date  . Adenomatous colon polyp 2001  . Anxiety   . Atrial fibrillation ([place])    takes Coumadin daily  . BPH (benign prostatic hyperplasia)    takes Proscar daily  . CAD (coronary artery disease) 2009   cardiac cath and PTCA by Dr. Tami Ribas  . Carpal tunnel syndrome    right  . Complication of anesthesia    hard to wake up  . Diverticulosis 2007   tcs by Dr. Laural Golden  . GERD (gastroesophageal reflux disease)   . Gout   . Hypercholesterolemia    takes Atorvastatin daily  . Hypertension    takes Imdur,Coreg,and Lisinopril daily  . Hypothyroidism    takes SYnthroid daily  . Joint pain   . Joint swelling   . Nocturia   . Numbness    right hand pointer and middle finger  . Osteoarthritis    left knee  . Pneumonia    many,many yrs ago  . Tubular adenoma 2001    Past Surgical History:  Procedure Laterality Date  . Barbie Banner OSTEOTOMY Right 03/17/2013   Procedure: Barbie Banner OSTEOTOMY;  Surgeon: Marcheta Grammes, DPM;  [facility];  Service: Orthopedics;  Laterality: Right;  . BUNIONECTOMY Right 03/17/2013    Procedure: BUNIONECTOMY, Arthroplasty 2nd toe right foot;  Surgeon: Marcheta Grammes, DPM;  Location: [facility];  Service: Orthopedics;  Laterality: Right;  . CARDIAC CATHETERIZATION  06/11/2007   PTCA X2 in 06/2007:  2 overlapping Cypher stents to LAD (2.5x13 and 3.0x23)  . CARDIAC CATHETERIZATION  06/21/2007   Cypher stent 2.5 x 13  to OM branch  . CARDIOVERSION  12/12/2011   Procedure: CARDIOVERSION;  Surgeon: Sanda Klein, MD;  [facility];  Service: Cardiovascular;  Laterality: N/A;  . CATARACT EXTRACTION     bilateral  . COLONOSCOPY  2007   Dr. Laural Golden- L sided diverticulosis. Next TCS 2012 due to h/o tubular adenoma.  . COLONOSCOPY  02/26/2011   Procedure: COLONOSCOPY;  Surgeon: Daneil Dolin, MD;  [facility];  Service: Endoscopy;  Laterality: N/A;  11:05  . CORONARY ANGIOPLASTY     x 3   . FLEXOR TENOTOMY Left 03/17/2013   Procedure: PERCUTANEOUS FLEXOR TENOTOMY 3RD TOE LEFT FOOT;  Surgeon: Marcheta Grammes, DPM;  Location: [facility];  Service: Orthopedics;  Laterality: Left;  . HERNIA REPAIR  2009   left inguinal  . rot Left   . TEE WITHOUT CARDIOVERSION  12/12/2011   Procedure: TRANSESOPHAGEAL ECHOCARDIOGRAM (TEE);  Surgeon: Sanda Klein, MD;  Location: [place];  Service: Cardiovascular;  Laterality: N/A;   successful/sinus rhythm  . TOTAL KNEE ARTHROPLASTY Left 06/14/2013   Procedure: TOTAL KNEE ARTHROPLASTY;  Surgeon: Garald Balding, MD;  Location: [place];  Service: Orthopedics;  Laterality: Left;    There were no vitals filed for this visit.      Subjective Assessment - 02/06/16 1031    Subjective  S: I did have a little soreness when I was doing my exercises this morning.    Currently in Pain? No/denies           [facility] OT Assessment - 02/06/16 1031      Assessment   Diagnosis Left Rotator Cuff Impingement Syndrome     Precautions   Precautions None     Palpation   Palpation comment min fascial restrictions noted in  left shoulder and scapular region      AROM   Overall AROM Comments Assessed seated, er/IR adducted   AROM Assessment Site Shoulder   Right/Left Shoulder Left   Left Shoulder Flexion 140 Degrees  135 previous   Left Shoulder ABduction 150 Degrees  151 previous   Left Shoulder Internal Rotation 90 Degrees  same as previous   Left Shoulder External Rotation 64 Degrees  59 previous     PROM   Overall PROM Comments Assessed supine, er/IR adducted   PROM Assessment Site Shoulder   Right/Left Shoulder Left   Left Shoulder Flexion 165 Degrees  157 previous   Left Shoulder ABduction 165 Degrees  same as previous   Left Shoulder Internal Rotation 90 Degrees  same as previous   Left Shoulder External Rotation 90 Degrees  same as previous     Strength   Overall Strength Comments Assessed seated, er/IR adducted   Strength Assessment Site Shoulder   Right/Left Shoulder Left   Left Shoulder Flexion 4+/5  same as previous   Left Shoulder ABduction 4+/5  4/5 previous   Left Shoulder Internal Rotation 4+/5  same as previous   Left Shoulder External Rotation 4/5  same as previous                  OT Treatments/Exercises (OP) - 02/06/16 1033      Exercises   Exercises Shoulder     Shoulder Exercises: Supine   Protraction PROM;5 reps   Horizontal ABduction PROM;5 reps   External Rotation PROM;5 reps   Internal Rotation PROM;5 reps   Flexion PROM;5 reps   ABduction PROM;5 reps     Shoulder Exercises: Standing   Protraction Theraband;5 reps   Theraband Level (Shoulder Protraction) Level 3 (Green)   Horizontal ABduction Theraband;5 reps   Theraband Level (Shoulder Horizontal ABduction) Level 3 (Green)   External Rotation Theraband;5 reps   Theraband Level (Shoulder External Rotation) Level 3 (Green)   Internal Rotation Theraband;5 reps   Theraband Level (Shoulder Internal Rotation) Level 3 (Green)   Flexion Theraband;5 reps   Theraband Level (Shoulder Flexion)  Level 3 (Green)     Manual Therapy   Manual Therapy Myofascial release   Manual therapy comments Manual therapy completed prior to ROM measurements and treatment exercises.   Myofascial Release Myofascial release and manual stretching to left upper arm, shoulder, scapular, and cervical regions to decrease restrictions and improve pain free mobility               OT Education - 02/06/16 1156    Education provided Yes   Education Details Green theraband exercises-flexion, horizontal abduction, er/IR, protraction   Person(s) Educated Patient  Methods Explanation;Demonstration;Handout   Comprehension Verbalized understanding;Returned demonstration          OT Short Term Goals - 02/06/16 1148      OT SHORT TERM GOAL #1   Title Patient will be education and independent with HEP for improved functional use of left arm.   Time 4   Period Weeks   Status Achieved     OT SHORT TERM GOAL #2   Title Patient will increase left shoulder P/ROM to [facility] to increase ability to dry his back and hang up his towel.   Time 4   Period Weeks   Status Achieved     OT SHORT TERM GOAL #3   Title Patient will increase left shoulder strength to 4+/5 to increase ability to pick up and stabalize household items.   Time 4   Period Weeks   Status Partially Met     OT SHORT TERM GOAL #4   Title Patient will decrease pain in his left shoulder region to 3/10 or better when sleeping at night.    Time 4   Period Weeks   Status Achieved     OT SHORT TERM GOAL #5   Title Patient will decrease left shoulder fascial restrictions to min-mod for improved mobility needed to reach overhead.   Time 4   Period Weeks   Status Achieved           OT Long Term Goals - 02/06/16 1148      OT LONG TERM GOAL #1   Title Patient will return to highest level of independence with all daily tasks using LUE.   Time 8   Period Weeks   Status Achieved     OT LONG TERM GOAL #2   Title Patient will increase  AROM in left shoulder  to WNL to increase ability to hang up his towel, reach overhead, and complete gardening and canning activities.    Time 8   Period Weeks   Status Partially Met     OT LONG TERM GOAL #3   Title Patient will increase left shoulder strength to 5/5 for improved abilty to lift items onto overhead shelves and pick up gardening supplies.    Time 8   Period Weeks   Status Not Met     OT LONG TERM GOAL #4   Title Patient will decrease fascial restrictions to trace amount in his left shoulder region in order to improve mobility needed to complete daily tasks.    Time 8   Period Weeks   Status Not Met     OT LONG TERM GOAL #5   Title Patient will decrease pain level to 1/10 in his left shoulder  when completing daily tasks.   Time 8   Period Weeks   Status Achieved               Plan - 02/06/16 1150    Clinical Impression Statement A: Reassessment completed this session, pt has met 4/5 STGs, partially met 1/5 STGs, 2/5 LTGs, and has partially met an additional LTG. Pt has made improvements in ROM, strength, and functional activity tolerance with LUE use during therapy. Pt reports he is now able to bring his gun up and hold it steady when shooting, and is able to complete all tasks with independence. Pt provided with green theraband exercises for HEP.    Plan P: Discharge pt   Consulted and Agree with Plan of Care Patient      Patient will  benefit from skilled therapeutic intervention in order to improve the following deficits and impairments:  Decreased range of motion, Decreased strength, Increased fascial restricitons, Impaired UE functional use, Increased muscle spasms, Pain  Visit Diagnosis: Stiffness of left shoulder, not elsewhere classified  Other symptoms and signs involving the musculoskeletal system      G-Codes - 28-Feb-2016 1157    Functional Assessment Tool Used FOTO Score: 71/100 (29% impairment)   Functional Limitation Carrying, moving and  handling objects   Carrying, Moving and Handling Objects Goal Status ([id number]) At least 1 percent but less than 20 percent impaired, limited or restricted   Carrying, Moving and Handling Objects Discharge Status [phone]) At least 20 percent but less than 40 percent impaired, limited or restricted      Problem List Patient Active Problem List   Diagnosis Date Noted  . 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 Arthritis of knee, left 06/16/2013  . S/P total knee replacement using cement 06/14/2013  . Osteoarthritis of left knee 03/21/2013  . BPH (benign prostatic hyperplasia)   . GERD (gastroesophageal reflux disease)   . CAD (coronary artery disease)   . Fever 03/20/2013  . Altered mental state 03/20/2013  . Postoperative fever 03/20/2013  . PNA (pneumonia) 03/20/2013  . Preop cardiovascular exam 11/24/2012  . Peroneal tendonitis 10/05/2012  . Arthritis of foot, degenerative 10/05/2012  . HTN (hypertension) 09/25/2012  . Hyperlipidemia 09/25/2012  . Atrial fibrillation ([place]) 06/15/2012  . Long term (current) use of anticoagulants 06/15/2012  . Rotator cuff strain 02/04/2012  . Shoulder pain 02/04/2012  . Personal history of colonic polyps 01/29/2011  . High risk medication use 01/29/2011   Guadelupe Sabin, OTR/L  [phone] 2016/02/28, 11:57 AM  [place], [place], [address] Phone: [phone]   Fax:  [phone]  Name: WAYBURN SHALER MRN: [id number] Date of Birth: [birth year]     OCCUPATIONAL THERAPY DISCHARGE SUMMARY  Visits from Start of Care: 12  Current functional level related to goals / functional outcomes: See above. Pt is now at highest level of functioning with ADL and IADL tasks, as well as leisure tasks.    Remaining deficits: Pt experiences some weakness in LUE with extended use.    Education / Equipment: Nyoka Cowden theraband strengthening exercises-flexion, horizontal abduction, IR/er,  protraction. Reviewed previously provided HEPs.  Plan: Patient agrees to discharge.  Patient goals were partially met. Patient is being discharged due to being pleased with the current functional level.  ?????

## 2016-02-12 DIAGNOSIS — E039 Hypothyroidism, unspecified: Secondary | ICD-10-CM | POA: Diagnosis not present

## 2016-02-16 ENCOUNTER — Other Ambulatory Visit: Payer: Self-pay | Admitting: Cardiovascular Disease

## 2016-02-18 ENCOUNTER — Ambulatory Visit (INDEPENDENT_AMBULATORY_CARE_PROVIDER_SITE_OTHER): Payer: PPO | Admitting: *Deleted

## 2016-02-18 ENCOUNTER — Encounter: Payer: Self-pay | Admitting: Internal Medicine

## 2016-02-18 DIAGNOSIS — Z7901 Long term (current) use of anticoagulants: Secondary | ICD-10-CM

## 2016-02-18 DIAGNOSIS — I4891 Unspecified atrial fibrillation: Secondary | ICD-10-CM | POA: Diagnosis not present

## 2016-02-18 LAB — POCT INR: INR: 2.5

## 2016-03-17 ENCOUNTER — Ambulatory Visit (INDEPENDENT_AMBULATORY_CARE_PROVIDER_SITE_OTHER): Payer: PPO | Admitting: *Deleted

## 2016-03-17 DIAGNOSIS — I4891 Unspecified atrial fibrillation: Secondary | ICD-10-CM

## 2016-03-17 DIAGNOSIS — Z7901 Long term (current) use of anticoagulants: Secondary | ICD-10-CM | POA: Diagnosis not present

## 2016-03-17 LAB — POCT INR: INR: 2.6

## 2016-04-09 DIAGNOSIS — N401 Enlarged prostate with lower urinary tract symptoms: Secondary | ICD-10-CM | POA: Diagnosis not present

## 2016-04-11 DIAGNOSIS — B351 Tinea unguium: Secondary | ICD-10-CM | POA: Diagnosis not present

## 2016-04-11 DIAGNOSIS — M19112 Post-traumatic osteoarthritis, left shoulder: Secondary | ICD-10-CM | POA: Diagnosis not present

## 2016-04-11 DIAGNOSIS — L03032 Cellulitis of left toe: Secondary | ICD-10-CM | POA: Diagnosis not present

## 2016-04-11 DIAGNOSIS — E039 Hypothyroidism, unspecified: Secondary | ICD-10-CM | POA: Diagnosis not present

## 2016-04-11 DIAGNOSIS — N401 Enlarged prostate with lower urinary tract symptoms: Secondary | ICD-10-CM | POA: Diagnosis not present

## 2016-04-11 DIAGNOSIS — I2510 Atherosclerotic heart disease of native coronary artery without angina pectoris: Secondary | ICD-10-CM | POA: Diagnosis not present

## 2016-04-11 DIAGNOSIS — Z23 Encounter for immunization: Secondary | ICD-10-CM | POA: Diagnosis not present

## 2016-04-11 DIAGNOSIS — I10 Essential (primary) hypertension: Secondary | ICD-10-CM | POA: Diagnosis not present

## 2016-04-11 DIAGNOSIS — E785 Hyperlipidemia, unspecified: Secondary | ICD-10-CM | POA: Diagnosis not present

## 2016-04-11 DIAGNOSIS — I482 Chronic atrial fibrillation: Secondary | ICD-10-CM | POA: Diagnosis not present

## 2016-04-18 ENCOUNTER — Ambulatory Visit (INDEPENDENT_AMBULATORY_CARE_PROVIDER_SITE_OTHER): Payer: PPO | Admitting: Urology

## 2016-04-18 DIAGNOSIS — R3915 Urgency of urination: Secondary | ICD-10-CM

## 2016-04-18 DIAGNOSIS — N401 Enlarged prostate with lower urinary tract symptoms: Secondary | ICD-10-CM

## 2016-04-18 DIAGNOSIS — R351 Nocturia: Secondary | ICD-10-CM

## 2016-04-18 DIAGNOSIS — R972 Elevated prostate specific antigen [PSA]: Secondary | ICD-10-CM | POA: Diagnosis not present

## 2016-04-19 ENCOUNTER — Other Ambulatory Visit: Payer: Self-pay | Admitting: Cardiovascular Disease

## 2016-04-21 ENCOUNTER — Ambulatory Visit (INDEPENDENT_AMBULATORY_CARE_PROVIDER_SITE_OTHER): Payer: PPO | Admitting: *Deleted

## 2016-04-21 DIAGNOSIS — I4891 Unspecified atrial fibrillation: Secondary | ICD-10-CM

## 2016-04-21 DIAGNOSIS — Z7901 Long term (current) use of anticoagulants: Secondary | ICD-10-CM

## 2016-04-21 LAB — POCT INR: INR: 2

## 2016-04-22 ENCOUNTER — Encounter: Payer: Self-pay | Admitting: Gastroenterology

## 2016-04-22 ENCOUNTER — Ambulatory Visit (INDEPENDENT_AMBULATORY_CARE_PROVIDER_SITE_OTHER): Payer: PPO | Admitting: Gastroenterology

## 2016-04-22 ENCOUNTER — Telehealth: Payer: Self-pay | Admitting: Gastroenterology

## 2016-04-22 VITALS — BP 142/87 | HR 117 | Temp 97.6°F | Ht 72.0 in | Wt 202.2 lb

## 2016-04-22 DIAGNOSIS — Z86010 Personal history of colonic polyps: Secondary | ICD-10-CM

## 2016-04-22 NOTE — Telephone Encounter (Signed)
OK to hold coumadin 5 days before procedure.  Restart coumadin night of procedure if ok with MD. Marina Gravel, Lattie Haw

## 2016-04-22 NOTE — Assessment & Plan Note (Signed)
Patient presents for consideration of surveillance colonoscopy for history of tubular adenomas removed from the colon in the past. His last colonoscopy was 5 years ago, no polyps at that time but marginal prep on the right side, he had diverticulosis. He is on chronic Coumadin therapy for history of A. fib. He also has a history of remote coronary artery stents. Patient is very much interested in pursuing at least one more surveillance colonoscopy. I will touch base with his cardiologist to see if we can hold his Coumadin therapy prior to procedure as well as touch base with Dr. Gala Romney regarding recommendations for one last TCS.  I have discussed the risks, alternatives, benefits with regards to but not limited to the risk of reaction to medication, bleeding, infection, perforation and the patient is agreeable to proceed. Written consent to be obtained.  We'll schedule colonoscopy if appropriate based on recommendations from cardiology and Dr. Gala Romney

## 2016-04-22 NOTE — Progress Notes (Signed)
cc'ed to pcp °

## 2016-04-22 NOTE — Progress Notes (Signed)
Discussed with cardiology as well as Dr. Gala Romney.  Please schedule colonoscopy with Dr. Gala Romney for history of colon polyps. Hold Coumadin 4 days prior to procedure. No need for Lovenox bridge.

## 2016-04-22 NOTE — Progress Notes (Signed)
Primary Care Physician:  Octavio Graves, DO  Primary Gastroenterologist:  Garfield Cornea, MD   Chief Complaint  Patient presents with  . Colonoscopy    consult, not having any problems    HPI:  Jerry Cain is a 83 y.o. male here to schedule surveillance colonoscopy for history of polyps. He has a history of tubular adenomas. Last colonoscopy November 2012, marginal prep on the right side. Extensive left-sided diverticulosis seen. Patient will like to consider colonoscopy. He states his parents lived to be in their mid 53s. He is doing well from a GI standpoint. Denies constipation, melena, rectal bleeding, abdominal pain, upper GI symptoms. He is on chronic Coumadin therapy in the setting of A. fib. He has a history of coronary artery stents placed in 2008.  Current Outpatient Prescriptions  Medication Sig Dispense Refill  . aspirin 81 MG tablet Take 81 mg by mouth every morning.     . Calcium Carbonate-Vitamin D (CALCIUM + D) 600-200 MG-UNIT TABS Take 1 tablet by mouth 2 (two) times daily.     . carvedilol (COREG) 25 MG tablet Take 1 tablet (25 mg total) by mouth 2 (two) times daily with a meal. 6 tablet 11  . cephALEXin (KEFLEX) 500 MG capsule Take 500 mg by mouth 2 (two) times daily.    . finasteride (PROSCAR) 5 MG tablet Take 5 mg by mouth every morning.     . Glucosamine 750 MG TABS Take 750 mg by mouth 2 (two) times daily.    . isosorbide mononitrate (IMDUR) 60 MG 24 hr tablet TAKE ONE TABLET ONCE DAILY IN THE MORNING. 30 tablet 0  . levothyroxine (SYNTHROID, LEVOTHROID) 125 MCG tablet Take 125 mcg by mouth daily before breakfast.     . lisinopril (PRINIVIL,ZESTRIL) 20 MG tablet TAKE ONE TABLET BY MOUTH AT BEDTIME (8PM). 30 tablet 0  . Multiple Vitamin (MULTIVITAMIN) capsule Take 1 capsule by mouth every morning.     . nitroGLYCERIN (NITROSTAT) 0.4 MG SL tablet Place 1 tablet (0.4 mg total) under the tongue every 5 (five) minutes as needed for chest pain. 25 tablet 3  .  rosuvastatin (CRESTOR) 10 MG tablet TAKE (1) TABLET BY MOUTH ONCE A WEEK. 4 tablet 11  . traMADol (ULTRAM) 50 MG tablet Take 50 mg by mouth every 6 (six) hours as needed.     . warfarin (COUMADIN) 5 MG tablet TAKE 1 TABLET BY MOUTH DAILY OR AS DIRECTED BY COUMADIN CLINIC. 90 tablet 1   No current facility-administered medications for this visit.     Allergies as of 04/22/2016 - Review Complete 04/22/2016  Allergen Reaction Noted  . Carbapenems  11/23/2014  . Cephalosporins  11/23/2014  . Penicillins Other (See Comments) 01/29/2011    Past Medical History:  Diagnosis Date  . Adenomatous colon polyp 2001  . Anxiety   . Atrial fibrillation ([place])    takes Coumadin daily  . BPH (benign prostatic hyperplasia)    takes Proscar daily  . CAD (coronary artery disease) 2009   cardiac cath and PTCA by Dr. Tami Ribas  . Carpal tunnel syndrome    right  . Complication of anesthesia    hard to wake up  . Diverticulosis 2007   tcs by Dr. Laural Golden  . GERD (gastroesophageal reflux disease)   . Gout   . Hypercholesterolemia    takes Atorvastatin daily  . Hypertension    takes Imdur,Coreg,and Lisinopril daily  . Hypothyroidism    takes SYnthroid daily  . Joint pain   .  Joint swelling   . Nocturia   . Numbness    right hand pointer and middle finger  . Osteoarthritis    left knee  . Pneumonia    many,many yrs ago  . Tubular adenoma 2001    Past Surgical History:  Procedure Laterality Date  . Barbie Banner OSTEOTOMY Right 03/17/2013   Procedure: Barbie Banner OSTEOTOMY;  Surgeon: Marcheta Grammes, DPM;  Location: [facility];  Service: Orthopedics;  Laterality: Right;  . BUNIONECTOMY Right 03/17/2013   Procedure: BUNIONECTOMY, Arthroplasty 2nd toe right foot;  Surgeon: Marcheta Grammes, DPM;  Location: [facility];  Service: Orthopedics;  Laterality: Right;  . CARDIAC CATHETERIZATION  06/11/2007   PTCA X2 in 06/2007:  2 overlapping Cypher stents to LAD (2.5x13 and 3.0x23)  . CARDIAC CATHETERIZATION   06/21/2007   Cypher stent 2.5 x 13  to OM branch  . CARDIOVERSION  12/12/2011   Procedure: CARDIOVERSION;  Surgeon: Sanda Klein, MD;  Location: [facility];  Service: Cardiovascular;  Laterality: N/A;  . CATARACT EXTRACTION     bilateral  . COLONOSCOPY  2007   Dr. Laural Golden- L sided diverticulosis. Next TCS 2012 due to h/o tubular adenoma.  . COLONOSCOPY  02/26/2011   Procedure: COLONOSCOPY;  Surgeon: Daneil Dolin, MD;  Location: [facility];  Service: Endoscopy;  Laterality: N/A;  11:05  . CORONARY ANGIOPLASTY     x 3   . FLEXOR TENOTOMY Left 03/17/2013   Procedure: PERCUTANEOUS FLEXOR TENOTOMY 3RD TOE LEFT FOOT;  Surgeon: Marcheta Grammes, DPM;  Location: [facility];  Service: Orthopedics;  Laterality: Left;  . HERNIA REPAIR  2009   left inguinal  . rot Left   . TEE WITHOUT CARDIOVERSION  12/12/2011   Procedure: TRANSESOPHAGEAL ECHOCARDIOGRAM (TEE);  Surgeon: Sanda Klein, MD;  Location: [facility];  Service: Cardiovascular;  Laterality: N/A;   successful/sinus rhythm  . TOTAL KNEE ARTHROPLASTY Left 06/14/2013   Procedure: TOTAL KNEE ARTHROPLASTY;  Surgeon: Garald Balding, MD;  Location: [place];  Service: Orthopedics;  Laterality: Left;    Family History  Problem Relation Age of Onset  . Pneumonia Father   . Heart disease Brother     open heart surgery at age 66  . Colon cancer Neg Hx     Social History   Social History  . Marital status: Married    Spouse name: N/A  . Number of children: 4  . Years of education: N/A   Occupational History  . retired    Social History Main Topics  . Smoking status: Never Smoker  . Smokeless tobacco: Never Used  . Alcohol use No  . Drug use: No  . Sexual activity: Not on file   Other Topics Concern  . Not on file   Social History Narrative  . No narrative on file      ROS:  General: Negative for anorexia, weight loss, fever, chills, fatigue, weakness. Eyes: Negative for vision changes.  ENT: Negative for  hoarseness, difficulty swallowing , nasal congestion. CV: Negative for chest pain, angina, palpitations, dyspnea on exertion, peripheral edema.  Respiratory: Negative for dyspnea at rest, dyspnea on exertion, cough, sputum, wheezing.  GI: See history of present illness. GU:  Negative for dysuria, hematuria, urinary incontinence, urinary frequency, nocturnal urination.  MS: Negative for joint pain, low back pain.  Derm: Negative for rash or itching.  Neuro: Negative for weakness, abnormal sensation, seizure, frequent headaches, memory loss, confusion.  Psych: Negative for anxiety, depression, suicidal ideation, hallucinations.  Endo: Negative  for unusual weight change.  Heme: Negative for bruising or bleeding. Allergy: Negative for rash or hives.    Physical Examination:  BP (!) 142/87   Pulse (!) 117   Temp 97.6 F (36.4 C) (Oral)   Ht 6' (1.829 m)   Wt 202 lb 3.2 oz (91.7 kg)   BMI 27.42 kg/m    General: Well-nourished, well-developed in no acute distress.  Head: Normocephalic, atraumatic.   Eyes: Conjunctiva pink, no icterus. Mouth: Oropharyngeal mucosa moist and pink , no lesions erythema or exudate. Neck: Supple without thyromegaly, masses, or lymphadenopathy.  Lungs: Clear to auscultation bilaterally.  Heart: Regular rate and rhythm, no murmurs rubs or gallops.  Abdomen: Bowel sounds are normal, nontender, nondistended, no hepatosplenomegaly or masses, no abdominal bruits or    hernia , no rebound or guarding.   Rectal: Not performed Extremities: No lower extremity edema. No clubbing or deformities.  Neuro: Alert and oriented x 4 , grossly normal neurologically.  Skin: Warm and dry, no rash or jaundice.   Psych: Alert and cooperative, normal mood and affect.   Imaging Studies: No results found.

## 2016-04-22 NOTE — Telephone Encounter (Signed)
Thank you :)

## 2016-04-22 NOTE — Patient Instructions (Signed)
1. I will touch base with your cardiologist regarding possibility of stopping your Coumadin for colonoscopy. We'll also discuss potential colonoscopy with Dr. Gala Romney. Further recommendations to follow.

## 2016-04-22 NOTE — Telephone Encounter (Signed)
Lattie Haw, we are considering colonoscopy for history of colon polyps in this gentleman. Would you please determine whether he can hold his Coumadin prior procedure or if he will need Lovenox bridging. Thank you.

## 2016-04-28 NOTE — Progress Notes (Signed)
LMOAM for pt to call office.

## 2016-04-28 NOTE — Progress Notes (Signed)
Called pt. He does not want to do Colonoscopy at this time due to his age, he's not having any problems, and he said he has only had small polyps in the past. Advised pt I would let [facility] know and if he decides he wants colonoscopy done later he may call office and schedule appointment.

## 2016-04-30 ENCOUNTER — Other Ambulatory Visit: Payer: Self-pay | Admitting: Cardiovascular Disease

## 2016-04-30 NOTE — Progress Notes (Signed)
Noted  

## 2016-06-02 ENCOUNTER — Ambulatory Visit (INDEPENDENT_AMBULATORY_CARE_PROVIDER_SITE_OTHER): Payer: PPO | Admitting: *Deleted

## 2016-06-02 DIAGNOSIS — I4891 Unspecified atrial fibrillation: Secondary | ICD-10-CM

## 2016-06-02 DIAGNOSIS — Z7901 Long term (current) use of anticoagulants: Secondary | ICD-10-CM | POA: Diagnosis not present

## 2016-06-02 LAB — POCT INR: INR: 3.1

## 2016-06-05 ENCOUNTER — Ambulatory Visit (INDEPENDENT_AMBULATORY_CARE_PROVIDER_SITE_OTHER): Payer: PPO | Admitting: Cardiovascular Disease

## 2016-06-05 ENCOUNTER — Encounter: Payer: Self-pay | Admitting: Cardiovascular Disease

## 2016-06-05 VITALS — BP 130/70 | HR 78 | Ht 72.0 in | Wt 203.0 lb

## 2016-06-05 DIAGNOSIS — I481 Persistent atrial fibrillation: Secondary | ICD-10-CM | POA: Diagnosis not present

## 2016-06-05 DIAGNOSIS — I10 Essential (primary) hypertension: Secondary | ICD-10-CM

## 2016-06-05 DIAGNOSIS — I5033 Acute on chronic diastolic (congestive) heart failure: Secondary | ICD-10-CM | POA: Diagnosis not present

## 2016-06-05 DIAGNOSIS — I453 Trifascicular block: Secondary | ICD-10-CM

## 2016-06-05 DIAGNOSIS — Z7901 Long term (current) use of anticoagulants: Secondary | ICD-10-CM

## 2016-06-05 DIAGNOSIS — I2510 Atherosclerotic heart disease of native coronary artery without angina pectoris: Secondary | ICD-10-CM

## 2016-06-05 DIAGNOSIS — Z79899 Other long term (current) drug therapy: Secondary | ICD-10-CM

## 2016-06-05 DIAGNOSIS — I4819 Other persistent atrial fibrillation: Secondary | ICD-10-CM

## 2016-06-05 MED ORDER — FUROSEMIDE 20 MG PO TABS: 20.0000 mg | ORAL_TABLET | Freq: Every day | ORAL | 3 refills | Status: DC

## 2016-06-05 NOTE — Progress Notes (Signed)
[name], male   DOB: 1931/12/05, 83 y.o.   MRN: [id number] [name], male   DOB: 1931-12-03, 83 y.o.   MRN: [id number]    Cardiology Office Note    Date:  06/06/2016   ID:  Jerry Cain, DOB 11/04/1931, MRN [id number]  PCP:  Octavio Graves, DO  Cardiologist:   Sanda Klein, MD   Chief Complaint  Patient presents with  . Follow-up    swelling in feet     History of Present Illness:  Jerry Cain is a 83 y.o. male with CAD and paroxysmal atrial fibrillation, on warfarin anticoagulation, trifascicular blockl  . There has never been a clear association between his dyspnea and atrial fibrillation, But he did have some exertional difficulties when his rate control was poor. He is in atrial fibrillation today and as usual is oblivious to palpitations.  Today he describes shortness of breath walking up hill and generally functional class II clinical status. He has noticed worsening bilateral calf and pedal edema. He has a swollen second toe on his left foot, but no fever, chills, skin breakdown or point tenderness. He denies dizziness or syncope. He has not experienced orthopnea, PND or angina pectoris.  In 2009, he received drug eluting Cypher stents to the LAD and the circumflex coronary arteries. Comorbidity includes systemic hypertension and hyperlipidemia both of which are adequately treated. By echocardiography in 2013 and in August 2017 he has preserved left ventricular systolic function and no valvular abnormalities. He had a normal stress nuclear myocardial scan in September of 2013. He has not had catheterization since 2009.   Past Medical History:  Diagnosis Date  . Adenomatous colon polyp 2001  . Anxiety   . Atrial fibrillation ([place])    takes Coumadin daily  . BPH (benign prostatic hyperplasia)    takes Proscar daily  . CAD (coronary artery disease) 2009   cardiac cath and PTCA by Dr. Tami Ribas  . Carpal tunnel syndrome     right  . Complication of anesthesia    hard to wake up  . Diverticulosis 2007   tcs by Dr. Laural Golden  . GERD (gastroesophageal reflux disease)   . Gout   . Hypercholesterolemia    takes Atorvastatin daily  . Hypertension    takes Imdur,Coreg,and Lisinopril daily  . Hypothyroidism    takes SYnthroid daily  . Joint pain   . Joint swelling   . Nocturia   . Numbness    right hand pointer and middle finger  . Osteoarthritis    left knee  . Pneumonia    many,many yrs ago  . Tubular adenoma 2001    Past Surgical History:  Procedure Laterality Date  . Barbie Banner OSTEOTOMY Right 03/17/2013   Procedure: Barbie Banner OSTEOTOMY;  Surgeon: Marcheta Grammes, DPM;  Location: [facility];  Service: Orthopedics;  Laterality: Right;  . BUNIONECTOMY Right 03/17/2013   Procedure: BUNIONECTOMY, Arthroplasty 2nd toe right foot;  Surgeon: Marcheta Grammes, DPM;  Location: [facility];  Service: Orthopedics;  Laterality: Right;  . CARDIAC CATHETERIZATION  06/11/2007   PTCA X2 in 06/2007:  2 overlapping Cypher stents to LAD (2.5x13 and 3.0x23)  . CARDIAC CATHETERIZATION  06/21/2007   Cypher stent 2.5 x 13  to OM branch  . CARDIOVERSION  12/12/2011   Procedure: CARDIOVERSION;  Surgeon: Sanda Klein, MD;  Location: [facility];  Service: Cardiovascular;  Laterality: N/A;  . CATARACT EXTRACTION     bilateral  . COLONOSCOPY  2007  Dr. Laural Golden- L sided diverticulosis. Next TCS 2012 due to h/o tubular adenoma.  . COLONOSCOPY  02/26/2011   Procedure: COLONOSCOPY;  Surgeon: Daneil Dolin, MD;  Location: [facility];  Service: Endoscopy;  Laterality: N/A;  11:05  . CORONARY ANGIOPLASTY     x 3   . FLEXOR TENOTOMY Left 03/17/2013   Procedure: PERCUTANEOUS FLEXOR TENOTOMY 3RD TOE LEFT FOOT;  Surgeon: Marcheta Grammes, DPM;  Location: [facility];  Service: Orthopedics;  Laterality: Left;  . HERNIA REPAIR  2009   left inguinal  . rot Left   . TEE WITHOUT CARDIOVERSION  12/12/2011   Procedure: TRANSESOPHAGEAL  ECHOCARDIOGRAM (TEE);  Surgeon: Sanda Klein, MD;  Location: [facility];  Service: Cardiovascular;  Laterality: N/A;   successful/sinus rhythm  . TOTAL KNEE ARTHROPLASTY Left 06/14/2013   Procedure: TOTAL KNEE ARTHROPLASTY;  Surgeon: Garald Balding, MD;  Location: [facility];  Service: Orthopedics;  Laterality: Left;    Outpatient Medications Prior to Visit  Medication Sig Dispense Refill  . aspirin 81 MG tablet Take 81 mg by mouth every morning.     . Calcium Carbonate-Vitamin D (CALCIUM + D) 600-200 MG-UNIT TABS Take 1 tablet by mouth 2 (two) times daily.     . carvedilol (COREG) 25 MG tablet Take 1 tablet (25 mg total) by mouth 2 (two) times daily with a meal. 6 tablet 11  . cephALEXin (KEFLEX) 500 MG capsule Take 500 mg by mouth 2 (two) times daily.    . finasteride (PROSCAR) 5 MG tablet Take 5 mg by mouth every morning.     . Glucosamine 750 MG TABS Take 750 mg by mouth 2 (two) times daily.    . isosorbide mononitrate (IMDUR) 60 MG 24 hr tablet TAKE ONE TABLET ONCE DAILY IN THE MORNING. 30 tablet 0  . levothyroxine (SYNTHROID, LEVOTHROID) 125 MCG tablet Take 125 mcg by mouth daily before breakfast.     . lisinopril (PRINIVIL,ZESTRIL) 20 MG tablet TAKE ONE TABLET BY MOUTH AT BEDTIME (8PM). 30 tablet 0  . Multiple Vitamin (MULTIVITAMIN) capsule Take 1 capsule by mouth every morning.     . nitroGLYCERIN (NITROSTAT) 0.4 MG SL tablet Place 1 tablet (0.4 mg total) under the tongue every 5 (five) minutes as needed for chest pain. 25 tablet 3  . rosuvastatin (CRESTOR) 10 MG tablet TAKE (1) TABLET BY MOUTH ONCE A WEEK. 4 tablet 0  . traMADol (ULTRAM) 50 MG tablet Take 50 mg by mouth every 6 (six) hours as needed.     . warfarin (COUMADIN) 5 MG tablet TAKE 1 TABLET BY MOUTH DAILY OR AS DIRECTED BY COUMADIN CLINIC. 90 tablet 1   No facility-administered medications prior to visit.      Allergies:   Carbapenems; Cephalosporins; and Penicillins   Social History   Social History  . Marital  status: Married    Spouse name: N/A  . Number of children: 4  . Years of education: N/A   Occupational History  . retired    Social History Main Topics  . Smoking status: Never Smoker  . Smokeless tobacco: Never Used  . Alcohol use No  . Drug use: No  . Sexual activity: Not Asked   Other Topics Concern  . None   Social History Narrative  . None     Family History:  The patient's family history includes Heart disease in his brother; Pneumonia in his father.   ROS:   Please see the history of present illness.  ROS All other systems reviewed and are negative.   PHYSICAL EXAM:   VS:  BP 130/70   Pulse 78   Ht 6' (1.829 m)   Wt 92.1 kg (203 lb)   BMI 27.53 kg/m    GEN: Well nourished, well developed, in no acute distress  HEENT: normal  Neck: 5-6 cm JVD, carotid bruits, or masses Cardiac: irregular rhythm; widely split second heart sound, no murmurs, rubs, or gallops, 1-2 plus symmetrical ankle and pedal edema  Respiratory:  clear to auscultation bilaterally, normal work of breathing GI: soft, nontender, nondistended, + BS MS: no deformity or atrophy  Skin: warm and dry, no rash Neuro:  Alert and Oriented x 3, Strength and sensation are intact Psych: euthymic mood, full affect  Wt Readings from Last 3 Encounters:  06/05/16 92.1 kg (203 lb)  04/22/16 91.7 kg (202 lb 3.2 oz)  12/12/15 92.3 kg (203 lb 6.4 oz)      Studies/Labs Reviewed:   EKG:  EKG is ordered today.   It shows atrial fibrillation with controlled rate, right bundle branch block and left anterior fascicular block   Lipid Panel    Component Value Date/Time   CHOL  06/19/2007 0223    104        ATP III CLASSIFICATION:  <200     mg/dL   Desirable  200-239  mg/dL   Borderline High  >=240    mg/dL   High   TRIG 102 06/19/2007 0223   HDL 19 (L) 06/19/2007 0223   CHOLHDL 5.5 06/19/2007 0223   VLDL 20 06/19/2007 0223   LDLCALC  06/19/2007 0223    65        Total Cholesterol/HDL:CHD  Risk Coronary Heart Disease Risk Table                     Men   Women  1/2 Average Risk   3.4   3.3       ASSESSMENT:    1. Acute on chronic diastolic congestive heart failure (HCC)   2. Persistent atrial fibrillation ([place])   3. Trifascicular block   4. Coronary artery disease involving native coronary artery of native heart without angina pectoris   5. Essential hypertension   6. Long term current use of anticoagulant therapy   7. Medication management      PLAN:  In order of problems listed above:  1. CHF: Mrs. Lubeck seems to have clear-cut evidence of hypervolemia with elevation in jugular venous pulsations and marked worsening of leg edema. However his weight hasn't changed, suggesting that he has lost true weight while gaining fluid. He has only mild dyspnea, with greater than usual physical activity. Will add a low dose of diuretic and I think we need to repeat his echocardiogram.  2. AFib: Rate control now seems adequate. CHADSVasc score is 4 (age 79, HTN, CAD, CHF). He has never had a stroke or TIA. The arrhythmia itself did not appear to be associated with any worsening symptoms as long as the rate was controlled. Last August he had normal left ventricular systolic function. Reminded him to avoid excessive sodium consumption and asked him to check and log his weight daily. 3. Trifascicular block: No signs or symptoms of high-grade AV block even after increasing the dose of carvedilol. Would like to avoid antiarrhythmic drugs which would probably precipitate the need for pacemaker therapy. 4. CAD: He has well controlled symptoms of stable angina. He has not taken nitroglycerin in  a very long time. Will get his lipid profile from his primary care provider. 5. HTN: Good blood pressure control.  6. Warfarin anticoagulation: Well-tolerated without bleeding complications     Medication Adjustments/Labs and Tests Ordered: Current medicines are reviewed at length with the  patient today.  Concerns regarding medicines are outlined above.  Medication changes, Labs and Tests ordered today are listed in the Patient Instructions below. Patient Instructions  Medication Instructions: Dr Sallyanne Kuster has recommended making the following medication changes: 1. START Furosemide 20 mg - take 1 tablet by mouth daily  Labwork: Your physician recommends that you return for lab work in 1 week - FASTING.  Testing/Procedures: 1. Echocardiogram to be performed at [facility] has requested that you have an echocardiogram. Echocardiography is a painless test that uses sound waves to create images of your heart. It provides your doctor with information about the size and shape of your heart and how well your heart's chambers and valves are working. This procedure takes approximately one hour. There are no restrictions for this procedure.  Follow-up: Dr Sallyanne Kuster recommends that you schedule a follow-up appointment first available.  If you need a refill on your cardiac medications before your next appointment, please call your pharmacy.   Your physician recommends that you weigh, daily, at the same time every day, and in the same amount of clothing. Please record your daily weights on the handout provided and bring it to your next appointment.  Daily Weight Record It is important to weigh yourself daily. Keep this daily weight chart near your scale. Weigh yourself each morning at the same time. Weigh yourself without shoes, and wear the same amount of clothing each day. Compare today's weight to yesterday's weight. Bring this form with you to your follow-up appointments. Call your health care provider if you have concerns about your weight, including rapid weight gain or rapid weight loss.  Date: ________ Weight: ____________________ Date: ________ Weight: ____________________ Date: ________ Weight: ____________________ Date: ________ Weight: ____________________ Date:  ________ Weight: ____________________ Date: ________ Weight: ____________________ Date: ________ Weight: ____________________ Date: ________ Weight: ____________________ Date: ________ Weight: ____________________ Date: ________ Weight: ____________________ Date: ________ Weight: ____________________ Date: ________ Weight: ____________________ Date: ________ Weight: ____________________ Date: ________ Weight: ____________________ Date: ________ Weight: ____________________ Date: ________ Weight: ____________________ Date: ________ Weight: ____________________ Date: ________ Weight: ____________________ Date: ________ Weight: ____________________ Date: ________ Weight: ____________________ Date: ________ Weight: ____________________ Date: ________ Weight: ____________________ Date: ________ Weight: ____________________ Date: ________ Weight: ____________________ Date: ________ Weight: ____________________ Date: ________ Weight: ____________________ Date: ________ Weight: ____________________ Date: ________ Weight: ____________________ Date: ________ Weight: ____________________ Date: ________ Weight: ____________________ Date: ________ Weight: ____________________ Date: ________ Weight: ____________________ Date: ________ Weight: ____________________ Date: ________ Weight: ____________________ Date: ________ Weight: ____________________ Date: ________ Weight: ____________________ Date: ________ Weight: ____________________ Date: ________ Weight: ____________________ Date: ________ Weight: ____________________ Date: ________ Weight: ____________________ Date: ________ Weight: ____________________ Date: ________ Weight: ____________________ Date: ________ Weight: ____________________ Date: ________ Weight: ____________________ Date: ________ Weight: ____________________ Date: ________ Weight: ____________________ Date: ________ Weight: ____________________ Date: ________ Weight:  ____________________ Date: ________ Weight: ____________________ Date: ________ Weight: ____________________ This information is not intended to replace advice given to you by your health care provider. Make sure you discuss any questions you have with your health care provider. Document Released: 06/05/2006 Document Revised: 03/07/2016 Document Reviewed: 10/21/2013 Elsevier Interactive Patient Education  2017 [place].    Low-Sodium Eating Plan Sodium, which is an element that makes up salt, helps you maintain a healthy balance of fluids  in your body. Too much sodium can increase your blood pressure and cause fluid and waste to be held in your body. Your health care provider or dietitian may recommend following this plan if you have high blood pressure (hypertension), kidney disease, liver disease, or heart failure. Eating less sodium can help lower your blood pressure, reduce swelling, and protect your heart, liver, and kidneys. What are tips for following this plan? General guidelines   Most people on this plan should limit their sodium intake to 1,500-2,000 mg (milligrams) of sodium each day. Reading food labels   The Nutrition Facts label lists the amount of sodium in one serving of the food. If you eat more than one serving, you must multiply the listed amount of sodium by the number of servings.  Choose foods with less than 140 mg of sodium per serving.  Avoid foods with 300 mg of sodium or more per serving. Shopping   Look for lower-sodium products, often labeled as "low-sodium" or "no salt added."  Always check the sodium content even if foods are labeled as "unsalted" or "no salt added".  Buy fresh foods.  Avoid canned foods and premade or frozen meals.  Avoid canned, cured, or processed meats  Buy breads that have less than 80 mg of sodium per slice. Cooking   Eat more home-cooked food and less restaurant, buffet, and fast food.  Avoid adding salt when cooking.  Use salt-free seasonings or herbs instead of table salt or sea salt. Check with your health care provider or pharmacist before using salt substitutes.  Cook with plant-based oils, such as canola, sunflower, or olive oil. Meal planning   When eating at a restaurant, ask that your food be prepared with less salt or no salt, if possible.  Avoid foods that contain MSG (monosodium glutamate). MSG is sometimes added to [place] food, bouillon, and some canned foods. What foods are recommended? The items listed may not be a complete list. Talk with your dietitian about what dietary choices are best for you. Grains  Low-sodium cereals, including oats, puffed wheat and rice, and shredded wheat. Low-sodium crackers. Unsalted rice. Unsalted pasta. Low-sodium bread. Whole-grain breads and whole-grain pasta. Vegetables  Fresh or frozen vegetables. "No salt added" canned vegetables. "No salt added" tomato sauce and paste. Low-sodium or reduced-sodium tomato and vegetable juice. Fruits  Fresh, frozen, or canned fruit. Fruit juice. Meats and other protein foods  Fresh or frozen (no salt added) meat, poultry, seafood, and fish. Low-sodium canned tuna and salmon. Unsalted nuts. Dried peas, beans, and lentils without added salt. Unsalted canned beans. Eggs. Unsalted nut butters. Dairy  Milk. Soy milk. Cheese that is naturally low in sodium, such as ricotta cheese, fresh mozzarella, or Swiss cheese Low-sodium or reduced-sodium cheese. Cream cheese. Yogurt. Fats and oils  Unsalted butter. Unsalted margarine with no trans fat. Vegetable oils such as canola or olive oils. Seasonings and other foods  Fresh and dried herbs and spices. Salt-free seasonings. Low-sodium mustard and ketchup. Sodium-free salad dressing. Sodium-free light mayonnaise. Fresh or refrigerated horseradish. Lemon juice. Vinegar. Homemade, reduced-sodium, or low-sodium soups. Unsalted popcorn and pretzels. Low-salt or salt-free chips. What foods  are not recommended? The items listed may not be a complete list. Talk with your dietitian about what dietary choices are best for you. Grains  Instant hot cereals. Bread stuffing, pancake, and biscuit mixes. Croutons. Seasoned rice or pasta mixes. Noodle soup cups. Boxed or frozen macaroni and cheese. Regular salted crackers. Self-rising flour. Vegetables  Sauerkraut, pickled  vegetables, and relishes. Olives. [place] fries. Onion rings. Regular canned vegetables (not low-sodium or reduced-sodium). Regular canned tomato sauce and paste (not low-sodium or reduced-sodium). Regular tomato and vegetable juice (not low-sodium or reduced-sodium). Frozen vegetables in sauces. Meats and other protein foods  Meat or fish that is salted, canned, smoked, spiced, or pickled. Bacon, ham, sausage, hotdogs, corned beef, chipped beef, packaged lunch meats, salt pork, jerky, pickled herring, anchovies, regular canned tuna, sardines, salted nuts. Dairy  Processed cheese and cheese spreads. Cheese curds. Blue cheese. Feta cheese. String cheese. Regular cottage cheese. Buttermilk. Canned milk. Fats and oils  Salted butter. Regular margarine. Ghee. Bacon fat. Seasonings and other foods  Onion salt, garlic salt, seasoned salt, table salt, and sea salt. Canned and packaged gravies. Worcestershire sauce. Tartar sauce. Barbecue sauce. Teriyaki sauce. Soy sauce, including reduced-sodium. Steak sauce. Fish sauce. Oyster sauce. Cocktail sauce. Horseradish that you find on the shelf. Regular ketchup and mustard. Meat flavorings and tenderizers. Bouillon cubes. Hot sauce and Tabasco sauce. Premade or packaged marinades. Premade or packaged taco seasonings. Relishes. Regular salad dressings. Salsa. Potato and tortilla chips. Corn chips and puffs. Salted popcorn and pretzels. Canned or dried soups. Pizza. Frozen entrees and pot pies. Summary  Eating less sodium can help lower your blood pressure, reduce swelling, and protect your  heart, liver, and kidneys.  Most people on this plan should limit their sodium intake to 1,500-2,000 mg (milligrams) of sodium each day.  Canned, boxed, and frozen foods are high in sodium. Restaurant foods, fast foods, and pizza are also very high in sodium. You also get sodium by adding salt to food.  Try to cook at home, eat more fresh fruits and vegetables, and eat less fast food, canned, processed, or prepared foods. This information is not intended to replace advice given to you by your health care provider. Make sure you discuss any questions you have with your health care provider. Document Released: 09/13/2001 Document Revised: 03/17/2016 Document Reviewed: 03/17/2016 Elsevier Interactive Patient Education  2017 [place], Sanda Klein, MD  06/06/2016 4:11 PM    [facility] [address], [place], [place]  [address] Phone: [phone]; Fax: [phone]

## 2016-06-05 NOTE — Patient Instructions (Signed)
Medication Instructions: Dr Sallyanne Kuster has recommended making the following medication changes: 1. START Furosemide 20 mg - take 1 tablet by mouth daily  Labwork: Your physician recommends that you return for lab work in 1 week - FASTING.  Testing/Procedures: 1. Echocardiogram to be performed at [facility] has requested that you have an echocardiogram. Echocardiography is a painless test that uses sound waves to create images of your heart. It provides your doctor with information about the size and shape of your heart and how well your heart's chambers and valves are working. This procedure takes approximately one hour. There are no restrictions for this procedure.  Follow-up: Dr Sallyanne Kuster recommends that you schedule a follow-up appointment first available.  If you need a refill on your cardiac medications before your next appointment, please call your pharmacy.   Your physician recommends that you weigh, daily, at the same time every day, and in the same amount of clothing. Please record your daily weights on the handout provided and bring it to your next appointment.  Daily Weight Record It is important to weigh yourself daily. Keep this daily weight chart near your scale. Weigh yourself each morning at the same time. Weigh yourself without shoes, and wear the same amount of clothing each day. Compare today's weight to yesterday's weight. Bring this form with you to your follow-up appointments. Call your health care provider if you have concerns about your weight, including rapid weight gain or rapid weight loss.  Date: ________ Weight: ____________________ Date: ________ Weight: ____________________ Date: ________ Weight: ____________________ Date: ________ Weight: ____________________ Date: ________ Weight: ____________________ Date: ________ Weight: ____________________ Date: ________ Weight: ____________________ Date: ________ Weight: ____________________ Date: ________  Weight: ____________________ Date: ________ Weight: ____________________ Date: ________ Weight: ____________________ Date: ________ Weight: ____________________ Date: ________ Weight: ____________________ Date: ________ Weight: ____________________ Date: ________ Weight: ____________________ Date: ________ Weight: ____________________ Date: ________ Weight: ____________________ Date: ________ Weight: ____________________ Date: ________ Weight: ____________________ Date: ________ Weight: ____________________ Date: ________ Weight: ____________________ Date: ________ Weight: ____________________ Date: ________ Weight: ____________________ Date: ________ Weight: ____________________ Date: ________ Weight: ____________________ Date: ________ Weight: ____________________ Date: ________ Weight: ____________________ Date: ________ Weight: ____________________ Date: ________ Weight: ____________________ Date: ________ Weight: ____________________ Date: ________ Weight: ____________________ Date: ________ Weight: ____________________ Date: ________ Weight: ____________________ Date: ________ Weight: ____________________ Date: ________ Weight: ____________________ Date: ________ Weight: ____________________ Date: ________ Weight: ____________________ Date: ________ Weight: ____________________ Date: ________ Weight: ____________________ Date: ________ Weight: ____________________ Date: ________ Weight: ____________________ Date: ________ Weight: ____________________ Date: ________ Weight: ____________________ Date: ________ Weight: ____________________ Date: ________ Weight: ____________________ Date: ________ Weight: ____________________ Date: ________ Weight: ____________________ Date: ________ Weight: ____________________ Date: ________ Weight: ____________________ Date: ________ Weight: ____________________ This information is not intended to replace advice given to you by your health care provider.  Make sure you discuss any questions you have with your health care provider. Document Released: 06/05/2006 Document Revised: 03/07/2016 Document Reviewed: 10/21/2013 Elsevier Interactive Patient Education  2017 [place].    Low-Sodium Eating Plan Sodium, which is an element that makes up salt, helps you maintain a healthy balance of fluids in your body. Too much sodium can increase your blood pressure and cause fluid and waste to be held in your body. Your health care provider or dietitian may recommend following this plan if you have high blood pressure (hypertension), kidney disease, liver disease, or heart failure. Eating less sodium can help lower your blood pressure, reduce swelling, and protect your heart, liver, and kidneys. What are tips for following this plan? General  guidelines   Most people on this plan should limit their sodium intake to 1,500-2,000 mg (milligrams) of sodium each day. Reading food labels   The Nutrition Facts label lists the amount of sodium in one serving of the food. If you eat more than one serving, you must multiply the listed amount of sodium by the number of servings.  Choose foods with less than 140 mg of sodium per serving.  Avoid foods with 300 mg of sodium or more per serving. Shopping   Look for lower-sodium products, often labeled as "low-sodium" or "no salt added."  Always check the sodium content even if foods are labeled as "unsalted" or "no salt added".  Buy fresh foods.  Avoid canned foods and premade or frozen meals.  Avoid canned, cured, or processed meats  Buy breads that have less than 80 mg of sodium per slice. Cooking   Eat more home-cooked food and less restaurant, buffet, and fast food.  Avoid adding salt when cooking. Use salt-free seasonings or herbs instead of table salt or sea salt. Check with your health care provider or pharmacist before using salt substitutes.  Cook with plant-based oils, such as canola,  sunflower, or olive oil. Meal planning   When eating at a restaurant, ask that your food be prepared with less salt or no salt, if possible.  Avoid foods that contain MSG (monosodium glutamate). MSG is sometimes added to [place] food, bouillon, and some canned foods. What foods are recommended? The items listed may not be a complete list. Talk with your dietitian about what dietary choices are best for you. Grains  Low-sodium cereals, including oats, puffed wheat and rice, and shredded wheat. Low-sodium crackers. Unsalted rice. Unsalted pasta. Low-sodium bread. Whole-grain breads and whole-grain pasta. Vegetables  Fresh or frozen vegetables. "No salt added" canned vegetables. "No salt added" tomato sauce and paste. Low-sodium or reduced-sodium tomato and vegetable juice. Fruits  Fresh, frozen, or canned fruit. Fruit juice. Meats and other protein foods  Fresh or frozen (no salt added) meat, poultry, seafood, and fish. Low-sodium canned tuna and salmon. Unsalted nuts. Dried peas, beans, and lentils without added salt. Unsalted canned beans. Eggs. Unsalted nut butters. Dairy  Milk. Soy milk. Cheese that is naturally low in sodium, such as ricotta cheese, fresh mozzarella, or Swiss cheese Low-sodium or reduced-sodium cheese. Cream cheese. Yogurt. Fats and oils  Unsalted butter. Unsalted margarine with no trans fat. Vegetable oils such as canola or olive oils. Seasonings and other foods  Fresh and dried herbs and spices. Salt-free seasonings. Low-sodium mustard and ketchup. Sodium-free salad dressing. Sodium-free light mayonnaise. Fresh or refrigerated horseradish. Lemon juice. Vinegar. Homemade, reduced-sodium, or low-sodium soups. Unsalted popcorn and pretzels. Low-salt or salt-free chips. What foods are not recommended? The items listed may not be a complete list. Talk with your dietitian about what dietary choices are best for you. Grains  Instant hot cereals. Bread stuffing, pancake, and  biscuit mixes. Croutons. Seasoned rice or pasta mixes. Noodle soup cups. Boxed or frozen macaroni and cheese. Regular salted crackers. Self-rising flour. Vegetables  Sauerkraut, pickled vegetables, and relishes. Olives. [place] fries. Onion rings. Regular canned vegetables (not low-sodium or reduced-sodium). Regular canned tomato sauce and paste (not low-sodium or reduced-sodium). Regular tomato and vegetable juice (not low-sodium or reduced-sodium). Frozen vegetables in sauces. Meats and other protein foods  Meat or fish that is salted, canned, smoked, spiced, or pickled. Bacon, ham, sausage, hotdogs, corned beef, chipped beef, packaged lunch meats, salt pork, jerky, pickled herring, anchovies, regular  canned tuna, sardines, salted nuts. Dairy  Processed cheese and cheese spreads. Cheese curds. Blue cheese. Feta cheese. String cheese. Regular cottage cheese. Buttermilk. Canned milk. Fats and oils  Salted butter. Regular margarine. Ghee. Bacon fat. Seasonings and other foods  Onion salt, garlic salt, seasoned salt, table salt, and sea salt. Canned and packaged gravies. Worcestershire sauce. Tartar sauce. Barbecue sauce. Teriyaki sauce. Soy sauce, including reduced-sodium. Steak sauce. Fish sauce. Oyster sauce. Cocktail sauce. Horseradish that you find on the shelf. Regular ketchup and mustard. Meat flavorings and tenderizers. Bouillon cubes. Hot sauce and Tabasco sauce. Premade or packaged marinades. Premade or packaged taco seasonings. Relishes. Regular salad dressings. Salsa. Potato and tortilla chips. Corn chips and puffs. Salted popcorn and pretzels. Canned or dried soups. Pizza. Frozen entrees and pot pies. Summary  Eating less sodium can help lower your blood pressure, reduce swelling, and protect your heart, liver, and kidneys.  Most people on this plan should limit their sodium intake to 1,500-2,000 mg (milligrams) of sodium each day.  Canned, boxed, and frozen foods are high in sodium.  Restaurant foods, fast foods, and pizza are also very high in sodium. You also get sodium by adding salt to food.  Try to cook at home, eat more fresh fruits and vegetables, and eat less fast food, canned, processed, or prepared foods. This information is not intended to replace advice given to you by your health care provider. Make sure you discuss any questions you have with your health care provider. Document Released: 09/13/2001 Document Revised: 03/17/2016 Document Reviewed: 03/17/2016 Elsevier Interactive Patient Education  2017 [employer].

## 2016-06-10 ENCOUNTER — Ambulatory Visit (HOSPITAL_COMMUNITY)
Admission: RE | Admit: 2016-06-10 | Discharge: 2016-06-10 | Disposition: A | Discharge status: Home / Self Care | Payer: PPO | Source: Ambulatory Visit | Attending: Cardiovascular Disease | Admitting: Cardiovascular Disease

## 2016-06-10 DIAGNOSIS — I5033 Acute on chronic diastolic (congestive) heart failure: Secondary | ICD-10-CM

## 2016-06-10 DIAGNOSIS — I340 Nonrheumatic mitral (valve) insufficiency: Secondary | ICD-10-CM | POA: Diagnosis not present

## 2016-06-10 DIAGNOSIS — I313 Pericardial effusion (noninflammatory): Secondary | ICD-10-CM | POA: Diagnosis not present

## 2016-06-10 NOTE — Progress Notes (Signed)
*  PRELIMINARY RESULTS* Echocardiogram 2D Echocardiogram has been performed.  Jerry Cain 06/10/2016, 12:04 PM

## 2016-06-12 DIAGNOSIS — I5033 Acute on chronic diastolic (congestive) heart failure: Secondary | ICD-10-CM | POA: Diagnosis not present

## 2016-06-12 DIAGNOSIS — Z79899 Other long term (current) drug therapy: Secondary | ICD-10-CM | POA: Diagnosis not present

## 2016-06-12 LAB — MAGNESIUM: MAGNESIUM: 1.9 mg/dL (ref 1.5–2.5)

## 2016-06-13 LAB — COMPREHENSIVE METABOLIC PANEL
ALT: 19 U/L (ref 9–46)
AST: 22 U/L (ref 10–35)
Albumin: 4 g/dL (ref 3.6–5.1)
Alkaline Phosphatase: 73 U/L (ref 40–115)
BILIRUBIN TOTAL: 1 mg/dL (ref 0.2–1.2)
BUN: 16 mg/dL (ref 7–25)
CHLORIDE: 105 mmol/L (ref 98–110)
CO2: 25 mmol/L (ref 20–31)
CREATININE: 1.17 mg/dL — AB (ref 0.70–1.11)
Calcium: 9.3 mg/dL (ref 8.6–10.3)
GLUCOSE: 92 mg/dL (ref 65–99)
Potassium: 4.3 mmol/L (ref 3.5–5.3)
Sodium: 140 mmol/L (ref 135–146)
Total Protein: 7.2 g/dL (ref 6.1–8.1)

## 2016-06-13 LAB — LIPID PANEL
Cholesterol: 107 mg/dL (ref <200)
HDL: 33 mg/dL — AB (ref >40)
LDL CALC: 58 mg/dL (ref <100)
Total CHOL/HDL Ratio: 3.2 Ratio (ref <5.0)
Triglycerides: 82 mg/dL (ref <150)
VLDL: 16 mg/dL (ref <30)

## 2016-06-18 ENCOUNTER — Other Ambulatory Visit: Payer: Self-pay | Admitting: Cardiovascular Disease

## 2016-06-18 NOTE — Telephone Encounter (Signed)
Rx(s) sent to pharmacy electronically.  

## 2016-07-14 ENCOUNTER — Ambulatory Visit (INDEPENDENT_AMBULATORY_CARE_PROVIDER_SITE_OTHER): Payer: PPO | Admitting: *Deleted

## 2016-07-14 DIAGNOSIS — Z7901 Long term (current) use of anticoagulants: Secondary | ICD-10-CM

## 2016-07-14 DIAGNOSIS — I4891 Unspecified atrial fibrillation: Secondary | ICD-10-CM

## 2016-07-14 LAB — POCT INR: INR: 2.8

## 2016-08-14 ENCOUNTER — Ambulatory Visit (INDEPENDENT_AMBULATORY_CARE_PROVIDER_SITE_OTHER): Payer: PPO | Admitting: Cardiovascular Disease

## 2016-08-14 ENCOUNTER — Encounter: Payer: Self-pay | Admitting: Cardiovascular Disease

## 2016-08-14 VITALS — BP 114/70 | HR 78 | Ht 70.5 in | Wt 192.6 lb

## 2016-08-14 DIAGNOSIS — I453 Trifascicular block: Secondary | ICD-10-CM

## 2016-08-14 DIAGNOSIS — I25118 Atherosclerotic heart disease of native coronary artery with other forms of angina pectoris: Secondary | ICD-10-CM

## 2016-08-14 DIAGNOSIS — I5032 Chronic diastolic (congestive) heart failure: Secondary | ICD-10-CM

## 2016-08-14 DIAGNOSIS — Z7901 Long term (current) use of anticoagulants: Secondary | ICD-10-CM

## 2016-08-14 DIAGNOSIS — I10 Essential (primary) hypertension: Secondary | ICD-10-CM | POA: Diagnosis not present

## 2016-08-14 DIAGNOSIS — I481 Persistent atrial fibrillation: Secondary | ICD-10-CM

## 2016-08-14 DIAGNOSIS — I4819 Other persistent atrial fibrillation: Secondary | ICD-10-CM

## 2016-08-14 NOTE — Progress Notes (Signed)
[name], male   DOB: 09-13-1931, 83 y.o.   MRN: [id number] [name], male   DOB: 03-03-1932, 83 y.o.   MRN: [id number]    Cardiology Office Note    Date:  08/16/2016   ID:  Jerry Cain, DOB 11-Feb-1932, MRN [id number]  PCP:  Octavio Graves, DO  Cardiologist:   Sanda Klein, MD   Chief Complaint  Patient presents with  . Follow-up    afib    History of Present Illness:  Jerry Cain is a 83 y.o. male with CAD and paroxysmal atrial fibrillation, on warfarin anticoagulation, trifascicular block And diastolic heart failure returning for follow-up.  He brings a very detailed list of his blood pressure and weight. His "dry weight" seems to be around 180 pounds on his home scale which surprisingly registers 12 pounds less than our office scale. He is very diligent about his sodium intake. He can tell me the sodium content of all the foods that he eats. His typical diet is rich in vegetables that he grows in his own garden. Usual blood pressure is in the 110/65-130/75 range. Has not had problems with edema, orthopnea, PND or exertional dyspnea. He does yard work without difficulty. He denies dizziness, palpitations or syncope. She does not think he's had any atrial fibrillation but has always been unaware of it.  In 2009, he received drug eluting Cypher stents to the LAD and the circumflex coronary arteries. Comorbidity includes systemic hypertension and hyperlipidemia both of which are adequately treated. By echocardiography in 2013 and in August 2017 he has preserved left ventricular systolic function and no valvular abnormalities. He had a normal stress nuclear myocardial scan in September of 2013. He has not had catheterization since 2009.   Past Medical History:  Diagnosis Date  . Adenomatous colon polyp 2001  . Anxiety   . Atrial fibrillation ([place])    takes Coumadin daily  . BPH (benign prostatic hyperplasia)    takes Proscar  daily  . CAD (coronary artery disease) 2009   cardiac cath and PTCA by Dr. Tami Ribas  . Carpal tunnel syndrome    right  . Complication of anesthesia    hard to wake up  . Diverticulosis 2007   tcs by Dr. Laural Golden  . GERD (gastroesophageal reflux disease)   . Gout   . Hypercholesterolemia    takes Atorvastatin daily  . Hypertension    takes Imdur,Coreg,and Lisinopril daily  . Hypothyroidism    takes SYnthroid daily  . Joint pain   . Joint swelling   . Nocturia   . Numbness    right hand pointer and middle finger  . Osteoarthritis    left knee  . Pneumonia    many,many yrs ago  . Tubular adenoma 2001    Past Surgical History:  Procedure Laterality Date  . Barbie Banner OSTEOTOMY Right 03/17/2013   Procedure: Barbie Banner OSTEOTOMY;  Surgeon: Marcheta Grammes, DPM;  Location: [facility];  Service: Orthopedics;  Laterality: Right;  . BUNIONECTOMY Right 03/17/2013   Procedure: BUNIONECTOMY, Arthroplasty 2nd toe right foot;  Surgeon: Marcheta Grammes, DPM;  Location: [facility];  Service: Orthopedics;  Laterality: Right;  . CARDIAC CATHETERIZATION  06/11/2007   PTCA X2 in 06/2007:  2 overlapping Cypher stents to LAD (2.5x13 and 3.0x23)  . CARDIAC CATHETERIZATION  06/21/2007   Cypher stent 2.5 x 13  to OM branch  . CARDIOVERSION  12/12/2011   Procedure: CARDIOVERSION;  Surgeon: Sanda Klein, MD;  Location: [facility];  Service: Cardiovascular;  Laterality: N/A;  . CATARACT EXTRACTION     bilateral  . COLONOSCOPY  2007   Dr. Laural Golden- L sided diverticulosis. Next TCS 2012 due to h/o tubular adenoma.  . COLONOSCOPY  02/26/2011   Procedure: COLONOSCOPY;  Surgeon: Daneil Dolin, MD;  Location: [facility];  Service: Endoscopy;  Laterality: N/A;  11:05  . CORONARY ANGIOPLASTY     x 3   . FLEXOR TENOTOMY Left 03/17/2013   Procedure: PERCUTANEOUS FLEXOR TENOTOMY 3RD TOE LEFT FOOT;  Surgeon: Marcheta Grammes, DPM;  Location: [facility];  Service: Orthopedics;  Laterality: Left;  . HERNIA  REPAIR  2009   left inguinal  . rot Left   . TEE WITHOUT CARDIOVERSION  12/12/2011   Procedure: TRANSESOPHAGEAL ECHOCARDIOGRAM (TEE);  Surgeon: Sanda Klein, MD;  Location: [facility];  Service: Cardiovascular;  Laterality: N/A;   successful/sinus rhythm  . TOTAL KNEE ARTHROPLASTY Left 06/14/2013   Procedure: TOTAL KNEE ARTHROPLASTY;  Surgeon: Garald Balding, MD;  Location: [facility];  Service: Orthopedics;  Laterality: Left;    Outpatient Medications Prior to Visit  Medication Sig Dispense Refill  . Calcium Carbonate-Vitamin D (CALCIUM + D) 600-200 MG-UNIT TABS Take 1 tablet by mouth 2 (two) times daily.     . carvedilol (COREG) 25 MG tablet Take 1 tablet (25 mg total) by mouth 2 (two) times daily with a meal. 6 tablet 11  . finasteride (PROSCAR) 5 MG tablet Take 5 mg by mouth every morning.     . Glucosamine 750 MG TABS Take 750 mg by mouth 2 (two) times daily.    . isosorbide mononitrate (IMDUR) 60 MG 24 hr tablet Take 1 tablet (60 mg total) by mouth daily. 30 tablet 11  . levothyroxine (SYNTHROID, LEVOTHROID) 125 MCG tablet Take 125 mcg by mouth daily before breakfast.     . lisinopril (PRINIVIL,ZESTRIL) 20 MG tablet Take 1 tablet (20 mg total) by mouth at bedtime. 30 tablet 11  . Multiple Vitamin (MULTIVITAMIN) capsule Take 1 capsule by mouth every morning.     . nitroGLYCERIN (NITROSTAT) 0.4 MG SL tablet Place 1 tablet (0.4 mg total) under the tongue every 5 (five) minutes as needed for chest pain. 25 tablet 3  . rosuvastatin (CRESTOR) 10 MG tablet Take 1 tablet (10 mg total) by mouth once a week. 4 tablet 11  . traMADol (ULTRAM) 50 MG tablet Take 50 mg by mouth every 6 (six) hours as needed.     . warfarin (COUMADIN) 5 MG tablet TAKE 1 TABLET BY MOUTH DAILY OR AS DIRECTED BY COUMADIN CLINIC. 90 tablet 1  . aspirin 81 MG tablet Take 81 mg by mouth every morning.     . cephALEXin (KEFLEX) 500 MG capsule Take 500 mg by mouth 2 (two) times daily.    . furosemide (LASIX) 20 MG tablet Take  1 tablet (20 mg total) by mouth daily. (Patient not taking: Reported on 08/14/2016) 90 tablet 3   No facility-administered medications prior to visit.      Allergies:   Carbapenems; Cephalosporins; and Penicillins   Social History   Social History  . Marital status: Married    Spouse name: N/A  . Number of children: 4  . Years of education: N/A   Occupational History  . retired    Social History Main Topics  . Smoking status: Never Smoker  . Smokeless tobacco: Never Used  . Alcohol use No  . Drug use: No  .  Sexual activity: Not Asked   Other Topics Concern  . None   Social History Narrative  . None     Family History:  The patient's family history includes Heart disease in his brother; Pneumonia in his father.   ROS:   Please see the history of present illness.    ROS All other systems reviewed and are negative.   PHYSICAL EXAM:   VS:  BP 114/70   Pulse 78   Ht 5' 10.5" (1.791 m)   Wt 192 lb 9.6 oz (87.4 kg)   BMI 27.24 kg/m     General: Alert, oriented x3, no distress Head: no evidence of trauma, PERRL, EOMI, no exophtalmos or lid lag, no myxedema, no xanthelasma; normal ears, nose and oropharynx Neck: normal jugular venous pulsations and no hepatojugular reflux; brisk carotid pulses without delay and no carotid bruits Chest: clear to auscultation, no signs of consolidation by percussion or palpation, normal fremitus, symmetrical and full respiratory excursions Cardiovascular: normal position and quality of the apical impulse, irregular rhythm, normal first and second heart sounds, no murmurs, rubs or gallops Abdomen: no tenderness or distention, no masses by palpation, no abnormal pulsatility or arterial bruits, normal bowel sounds, no hepatosplenomegaly Extremities: no clubbing, cyanosis or edema; 2+ radial, ulnar and brachial pulses bilaterally; 2+ right femoral, posterior tibial and dorsalis pedis pulses; 2+ left femoral, posterior tibial and dorsalis pedis  pulses; no subclavian or femoral bruits Neurological: grossly nonfocal  Wt Readings from Last 3 Encounters:  08/14/16 192 lb 9.6 oz (87.4 kg)  06/05/16 203 lb (92.1 kg)  04/22/16 202 lb 3.2 oz (91.7 kg)      Studies/Labs Reviewed:   EKG:  EKG is not ordered today.    Lipid Panel    Component Value Date/Time   CHOL 107 06/12/2016 0929   TRIG 82 06/12/2016 0929   HDL 33 (L) 06/12/2016 0929   CHOLHDL 3.2 06/12/2016 0929   VLDL 16 06/12/2016 0929   LDLCALC 58 06/12/2016 0929       ASSESSMENT:    1. Persistent atrial fibrillation ([place])   2. Chronic diastolic heart failure ([place])   3. Trifascicular block   4. Coronary artery disease of native artery of native heart with stable angina pectoris ([place])   5. Essential hypertension   6. Long term current use of anticoagulant therapy      PLAN:  In order of problems listed above:  1. CHF: NYHA functional class I, euvolemic, on a very low dose of diuretic. He is only taking the furosemide every other day. Noticed that sometimes he has to do salty snack and drink extra fluid for dizziness. These symptoms improved after reducing the diuretic to every other day.  2. AFib: Rate control is adequate. CHADSVasc score is 4 (age 66, HTN, CAD, CHF). He has never had a stroke or TIA. There is no clear correlation between the arrhythmia and his symptoms. Not clear to me whether he has actually settled into a pattern of long-term persistent atrial fibrillation or he happens to be in atrial fibrillation every time he's come to the office. 3. Trifascicular block: Has not had symptoms to suggest episodes of high-grade AV block. Avoid adding any other negative chronotropic score antiarrhythmics because of the conduction abnormalities 4. CAD: CCS class 1 stable angina. He has not taken nitroglycerin in a very long time. Lipid profile checked by his PCP is excellent  5. HTN: Good blood pressure control.  6. Warfarin anticoagulation: Well-tolerated  without bleeding complications.  Stop aspirin since he does not have recent coronary events and this increases the bleeding risk.     Medication Adjustments/Labs and Tests Ordered: Current medicines are reviewed at length with the patient today.  Concerns regarding medicines are outlined above.  Medication changes, Labs and Tests ordered today are listed in the Patient Instructions below. Patient Instructions  Dr Sallyanne Kuster has recommended making the following medication changes: 1. STOP Aspirin  Your physician recommends that you schedule a follow-up appointment in 12 months. You will receive a reminder letter in the mail two months in advance. If you don't receive a letter, please call our office to schedule the follow-up appointment.  If you need a refill on your cardiac medications before your next appointment, please call your pharmacy. Signed, Sanda Klein, MD  08/16/2016 3:47 PM    [facility] [address], [place], [place]  [address] Phone: [phone]; Fax: [phone]

## 2016-08-14 NOTE — Patient Instructions (Signed)
Dr Croitoru has recommended making the following medication changes: 1. STOP Aspirin  Your physician recommends that you schedule a follow-up appointment in 12 months. You will receive a reminder letter in the mail two months in advance. If you don't receive a letter, please call our office to schedule the follow-up appointment.  If you need a refill on your cardiac medications before your next appointment, please call your pharmacy. 

## 2016-08-16 DIAGNOSIS — I5033 Acute on chronic diastolic (congestive) heart failure: Secondary | ICD-10-CM | POA: Insufficient documentation

## 2016-08-25 ENCOUNTER — Ambulatory Visit (INDEPENDENT_AMBULATORY_CARE_PROVIDER_SITE_OTHER): Payer: PPO | Admitting: *Deleted

## 2016-08-25 DIAGNOSIS — I4891 Unspecified atrial fibrillation: Secondary | ICD-10-CM | POA: Diagnosis not present

## 2016-08-25 DIAGNOSIS — Z7901 Long term (current) use of anticoagulants: Secondary | ICD-10-CM | POA: Diagnosis not present

## 2016-08-25 LAB — POCT INR: INR: 3

## 2016-09-05 ENCOUNTER — Other Ambulatory Visit: Payer: Self-pay | Admitting: Cardiovascular Disease

## 2016-10-06 ENCOUNTER — Ambulatory Visit (INDEPENDENT_AMBULATORY_CARE_PROVIDER_SITE_OTHER): Payer: PPO | Admitting: *Deleted

## 2016-10-06 DIAGNOSIS — Z7901 Long term (current) use of anticoagulants: Secondary | ICD-10-CM | POA: Diagnosis not present

## 2016-10-06 DIAGNOSIS — I4891 Unspecified atrial fibrillation: Secondary | ICD-10-CM

## 2016-10-06 LAB — POCT INR: INR: 3

## 2016-10-16 ENCOUNTER — Other Ambulatory Visit: Payer: Self-pay | Admitting: Cardiovascular Disease

## 2016-10-16 NOTE — Telephone Encounter (Signed)
Rx request sent to pharmacy.  

## 2016-10-19 IMAGING — MR MR SHOULDER*L* W/O CM
5 of 10 series · 20 of 40 positions shown · non-contrast
Comparison: None.

CLINICAL DATA: Chronic left shoulder pain with decreased range of
motion.

EXAM:
MRI OF THE LEFT SHOULDER WITHOUT CONTRAST
TECHNIQUE: Multiplanar, multisequence MR imaging of the shoulder was performed.
No intravenous contrast was administered.

[Series 3: t2fs axial · axial · 3.0mm · 0.25mm/px · z∈[-51,+28]mm · 4 of 24 slices shown]
[im 1/24]
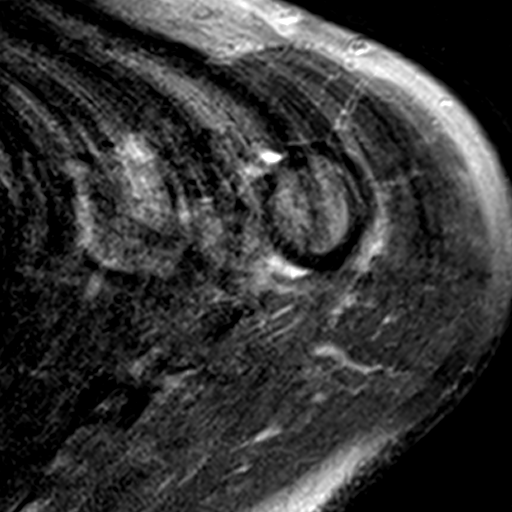
[im 8/24]
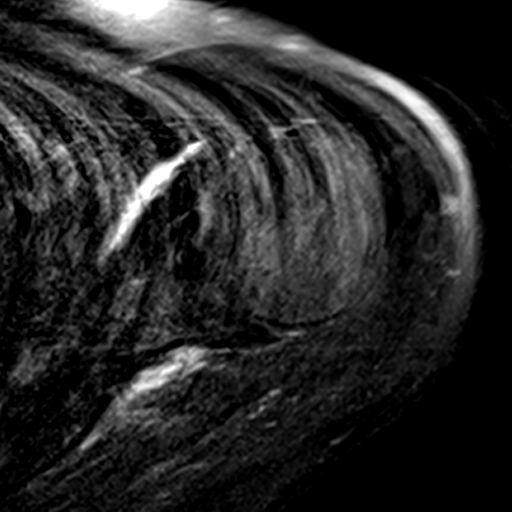
[im 16/24]
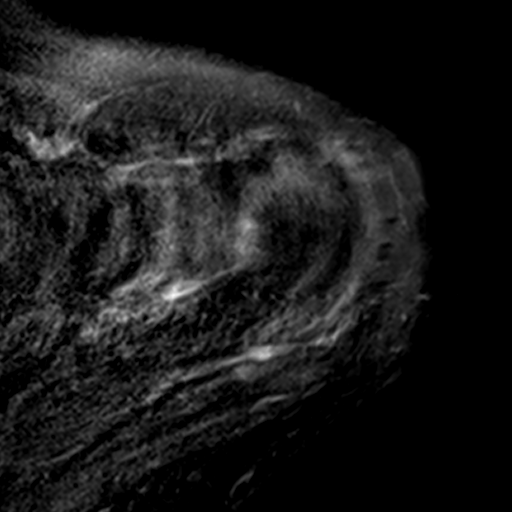
[im 24/24]
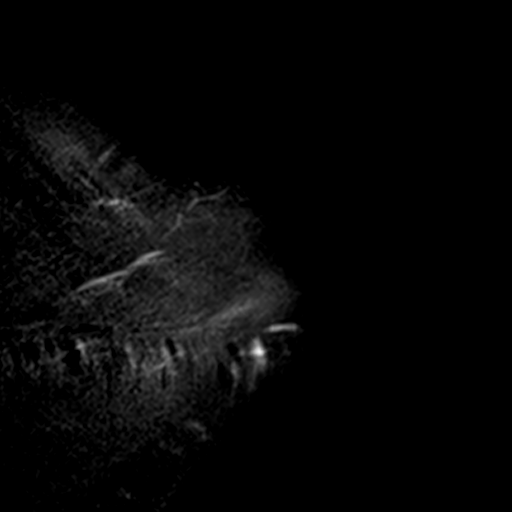

[Series 4: t2fs axial blade · axial · 3.0mm · 0.44mm/px · z∈[-60,+19]mm · 4 of 24 slices shown]
[im 1/24]
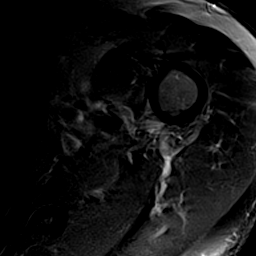
[im 8/24]
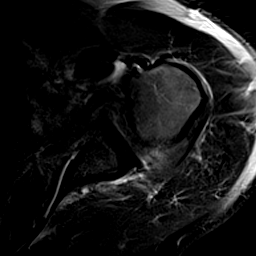
[im 16/24]
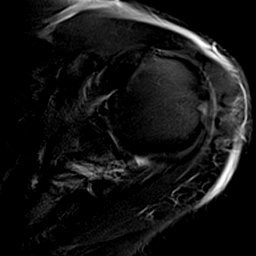
[im 24/24]
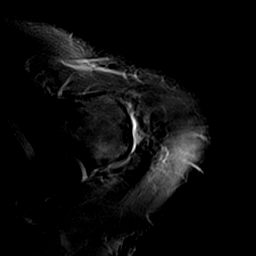

[Series 7: T1 · oblique · 3.0mm · 0.27mm/px · 4 of 24 slices shown]
[im 1/24]
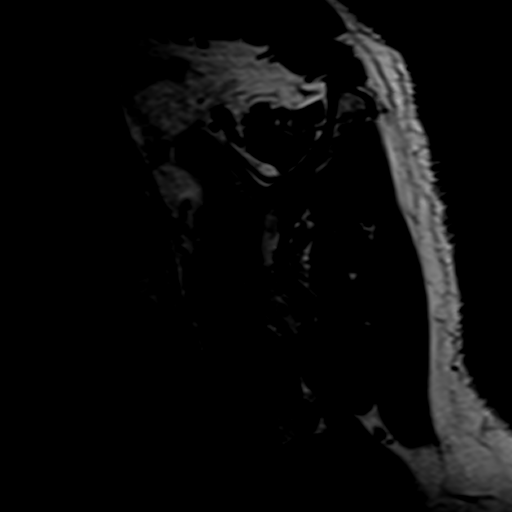
[im 8/24]
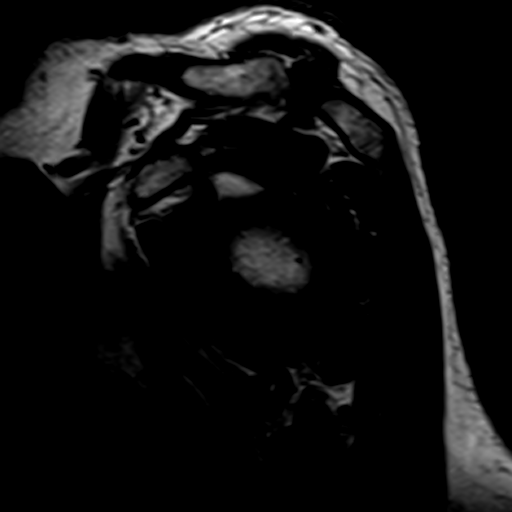
[im 16/24]
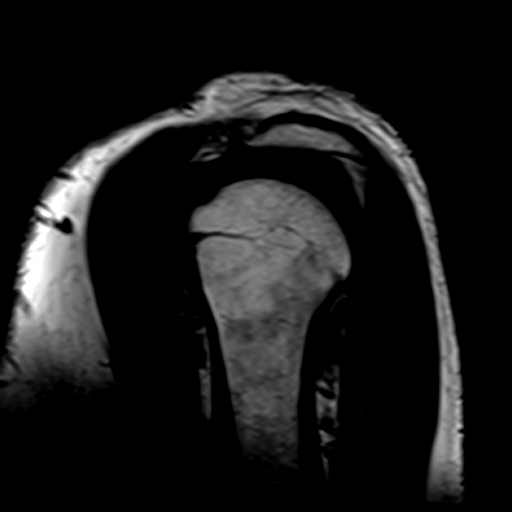
[im 24/24]
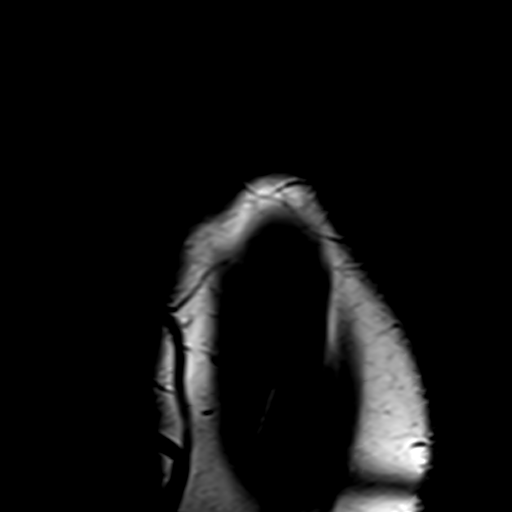

[Series 8: t2fs sagital · oblique · 3.0mm · 0.30mm/px · 4 of 24 slices shown]
[im 1/24]
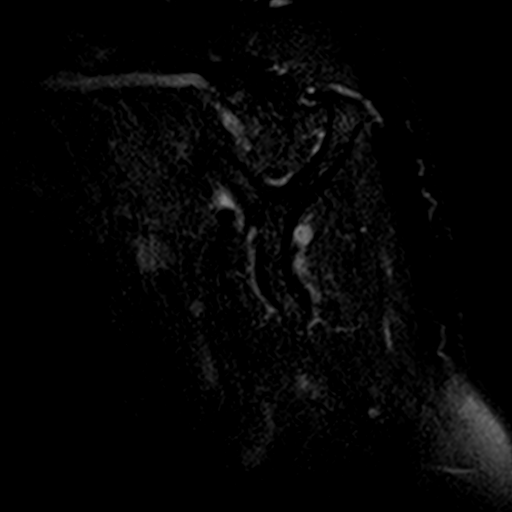
[im 8/24]
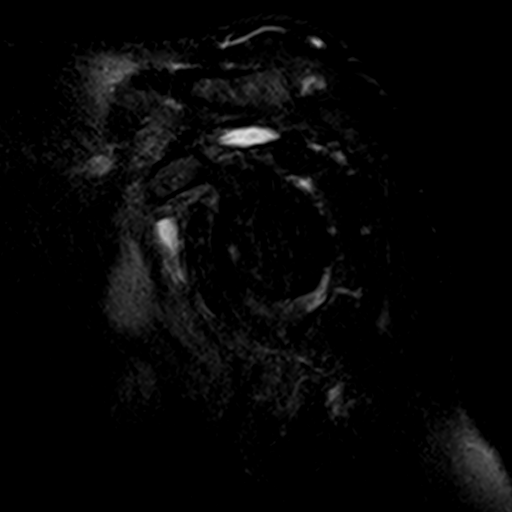
[im 16/24]
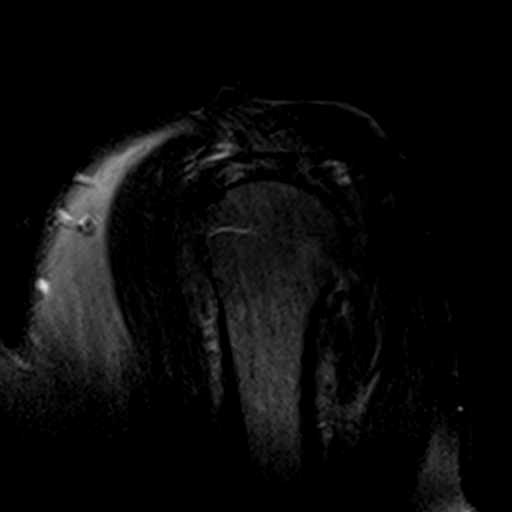
[im 24/24]
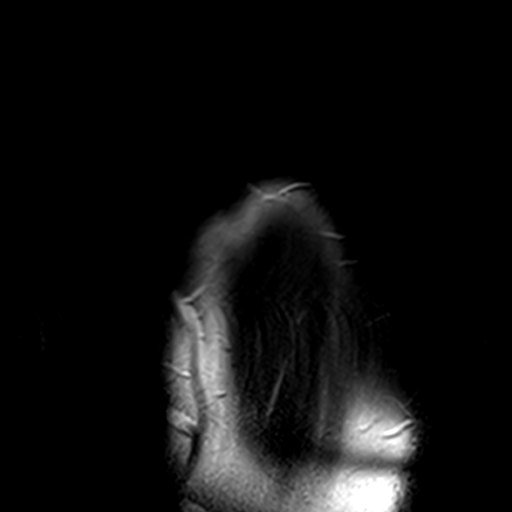

[Series 15: PD · oblique · 3.0mm · 0.19mm/px · 4 of 24 slices shown]
[im 1/24]
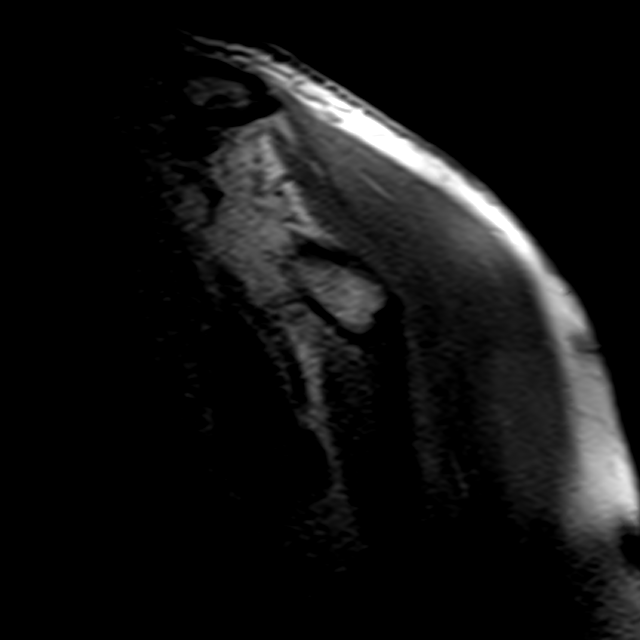
[im 8/24]
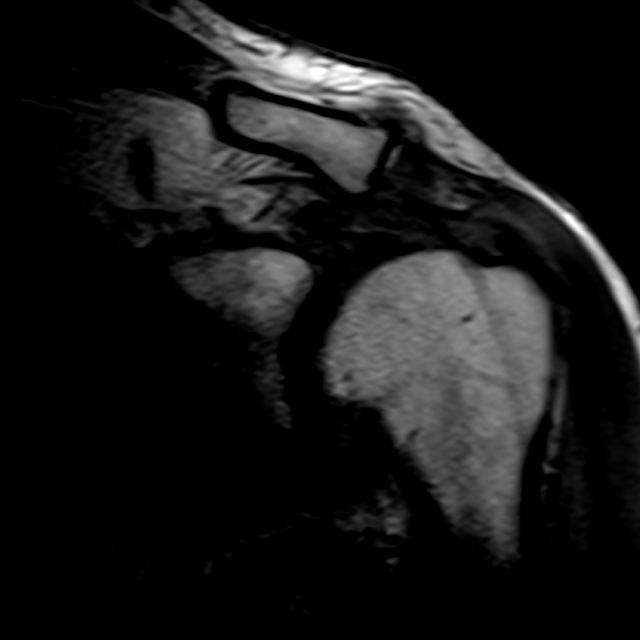
[im 16/24]
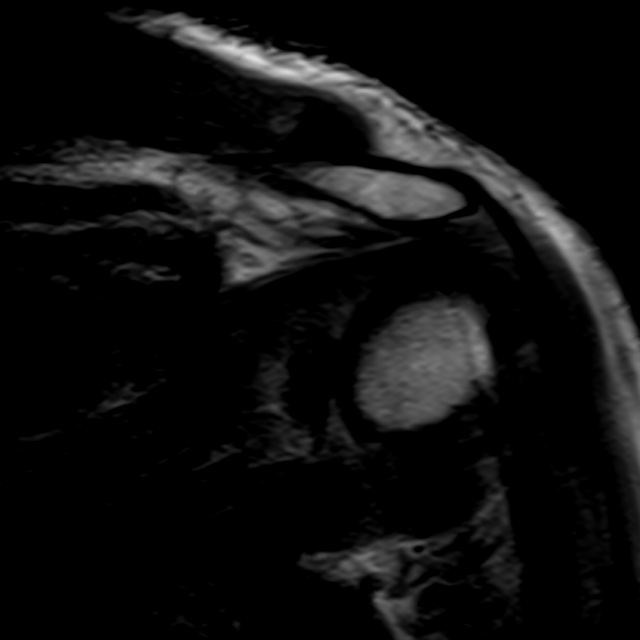
[im 24/24]
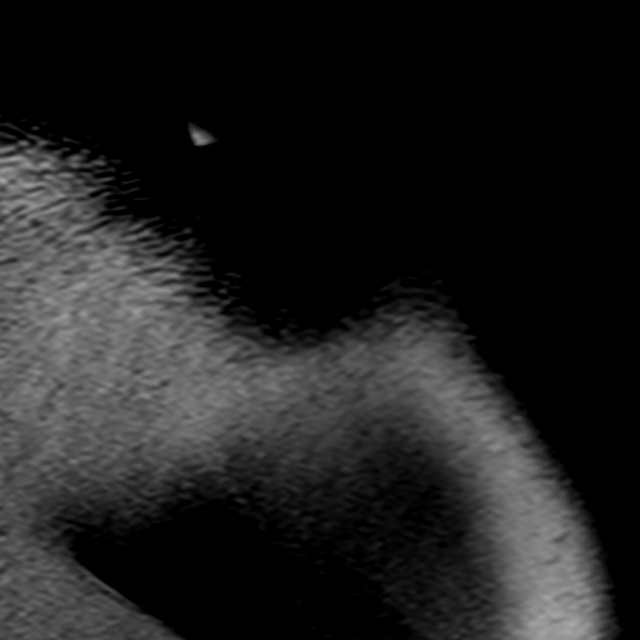

[20 of 40 positions shown; findings below may reference images not displayed]

FINDINGS: Rotator cuff: Intact. Tendinopathy of the distal supraspinous
tendon.

Muscles:  Moderate atrophy of the supraspinous muscle.

Biceps long head:  Properly located and intact.

Acromioclavicular Joint: Severe AC joint arthropathy with joint
effusion. Type 3 acromion which could predispose to impingement. No
bursitis.

Glenohumeral Joint: Small glenohumeral joint effusion. Thinning of
the articular cartilage of the glenoid.

Labrum:  Not well seen due to motion degradation.

Bones:  The extra-articular bones are normal.
IMPRESSION: 1. Severe AC joint arthropathy with a type 3 acromion which could
predispose to impingement.
2. Tendinopathy of the distal supraspinatus tendon without a tear.
3. Slight degenerative changes of the glenohumeral joint with a
small joint effusion.

## 2016-11-17 ENCOUNTER — Ambulatory Visit (INDEPENDENT_AMBULATORY_CARE_PROVIDER_SITE_OTHER): Payer: PPO | Admitting: *Deleted

## 2016-11-17 DIAGNOSIS — Z7901 Long term (current) use of anticoagulants: Secondary | ICD-10-CM | POA: Diagnosis not present

## 2016-11-17 DIAGNOSIS — I4891 Unspecified atrial fibrillation: Secondary | ICD-10-CM | POA: Diagnosis not present

## 2016-11-17 LAB — POCT INR: INR: 3

## 2016-12-04 DIAGNOSIS — R0789 Other chest pain: Secondary | ICD-10-CM | POA: Diagnosis not present

## 2016-12-29 ENCOUNTER — Ambulatory Visit (INDEPENDENT_AMBULATORY_CARE_PROVIDER_SITE_OTHER): Payer: PPO | Admitting: *Deleted

## 2016-12-29 DIAGNOSIS — Z5181 Encounter for therapeutic drug level monitoring: Secondary | ICD-10-CM

## 2016-12-29 DIAGNOSIS — Z7901 Long term (current) use of anticoagulants: Secondary | ICD-10-CM | POA: Diagnosis not present

## 2016-12-29 DIAGNOSIS — I4891 Unspecified atrial fibrillation: Secondary | ICD-10-CM

## 2016-12-29 LAB — POCT INR: INR: 2.6

## 2017-01-07 DIAGNOSIS — I25118 Atherosclerotic heart disease of native coronary artery with other forms of angina pectoris: Secondary | ICD-10-CM | POA: Diagnosis not present

## 2017-01-07 DIAGNOSIS — E039 Hypothyroidism, unspecified: Secondary | ICD-10-CM | POA: Diagnosis not present

## 2017-01-07 DIAGNOSIS — E782 Mixed hyperlipidemia: Secondary | ICD-10-CM | POA: Diagnosis not present

## 2017-01-07 DIAGNOSIS — I10 Essential (primary) hypertension: Secondary | ICD-10-CM | POA: Diagnosis not present

## 2017-01-07 DIAGNOSIS — M1711 Unilateral primary osteoarthritis, right knee: Secondary | ICD-10-CM | POA: Diagnosis not present

## 2017-01-07 DIAGNOSIS — B351 Tinea unguium: Secondary | ICD-10-CM | POA: Diagnosis not present

## 2017-01-07 DIAGNOSIS — N400 Enlarged prostate without lower urinary tract symptoms: Secondary | ICD-10-CM | POA: Diagnosis not present

## 2017-01-07 DIAGNOSIS — R609 Edema, unspecified: Secondary | ICD-10-CM | POA: Diagnosis not present

## 2017-02-13 ENCOUNTER — Ambulatory Visit (INDEPENDENT_AMBULATORY_CARE_PROVIDER_SITE_OTHER): Payer: PPO | Admitting: *Deleted

## 2017-02-13 DIAGNOSIS — Z7901 Long term (current) use of anticoagulants: Secondary | ICD-10-CM | POA: Diagnosis not present

## 2017-02-13 DIAGNOSIS — I4891 Unspecified atrial fibrillation: Secondary | ICD-10-CM

## 2017-02-13 LAB — POCT INR: INR: 2

## 2017-02-13 NOTE — Progress Notes (Signed)
Continue coumadin 1 tablet daily except 1/2 tablet on Saturdays Continue  [facility] Recheck in 6 weeks

## 2017-03-20 ENCOUNTER — Other Ambulatory Visit: Payer: Self-pay | Admitting: Cardiovascular Disease

## 2017-03-20 NOTE — Telephone Encounter (Signed)
Please review for refill, Thanks !  

## 2017-03-25 ENCOUNTER — Ambulatory Visit (INDEPENDENT_AMBULATORY_CARE_PROVIDER_SITE_OTHER): Payer: PPO | Admitting: *Deleted

## 2017-03-25 DIAGNOSIS — Z7901 Long term (current) use of anticoagulants: Secondary | ICD-10-CM

## 2017-03-25 DIAGNOSIS — I4891 Unspecified atrial fibrillation: Secondary | ICD-10-CM

## 2017-03-25 LAB — POCT INR: INR: 2.8

## 2017-04-24 ENCOUNTER — Other Ambulatory Visit: Payer: Self-pay | Admitting: Cardiovascular Disease

## 2017-04-24 ENCOUNTER — Ambulatory Visit: Payer: Medicare Other | Admitting: Urology

## 2017-04-24 DIAGNOSIS — N401 Enlarged prostate with lower urinary tract symptoms: Secondary | ICD-10-CM | POA: Diagnosis not present

## 2017-04-24 DIAGNOSIS — R972 Elevated prostate specific antigen [PSA]: Secondary | ICD-10-CM | POA: Diagnosis not present

## 2017-04-24 DIAGNOSIS — R351 Nocturia: Secondary | ICD-10-CM | POA: Diagnosis not present

## 2017-05-06 ENCOUNTER — Ambulatory Visit (INDEPENDENT_AMBULATORY_CARE_PROVIDER_SITE_OTHER): Payer: Medicare Other | Admitting: *Deleted

## 2017-05-06 DIAGNOSIS — Z7901 Long term (current) use of anticoagulants: Secondary | ICD-10-CM

## 2017-05-06 DIAGNOSIS — I4891 Unspecified atrial fibrillation: Secondary | ICD-10-CM | POA: Diagnosis not present

## 2017-05-06 DIAGNOSIS — Z5181 Encounter for therapeutic drug level monitoring: Secondary | ICD-10-CM | POA: Diagnosis not present

## 2017-05-06 LAB — POCT INR: INR: 3

## 2017-05-06 NOTE — Patient Instructions (Signed)
Continue coumadin 1 tablet daily except 1/2 tablet on Saturdays Continue  [facility] Recheck in 6 weeks

## 2017-05-16 ENCOUNTER — Other Ambulatory Visit: Payer: Self-pay | Admitting: Cardiovascular Disease

## 2017-05-18 NOTE — Telephone Encounter (Signed)
REFILL 

## 2017-06-01 ENCOUNTER — Other Ambulatory Visit: Payer: Self-pay | Admitting: Cardiovascular Disease

## 2017-06-17 ENCOUNTER — Ambulatory Visit (INDEPENDENT_AMBULATORY_CARE_PROVIDER_SITE_OTHER): Payer: Medicare Other | Admitting: *Deleted

## 2017-06-17 DIAGNOSIS — I4891 Unspecified atrial fibrillation: Secondary | ICD-10-CM

## 2017-06-17 DIAGNOSIS — Z7901 Long term (current) use of anticoagulants: Secondary | ICD-10-CM | POA: Diagnosis not present

## 2017-06-17 LAB — POCT INR: INR: 2.5

## 2017-06-17 NOTE — Patient Instructions (Signed)
Continue coumadin 1 tablet daily except 1/2 tablet on Saturdays Continue  [facility] Recheck in 6 weeks

## 2017-06-20 ENCOUNTER — Other Ambulatory Visit: Payer: Self-pay | Admitting: Cardiovascular Disease

## 2017-06-25 ENCOUNTER — Other Ambulatory Visit: Payer: Self-pay | Admitting: Cardiovascular Disease

## 2017-07-08 ENCOUNTER — Other Ambulatory Visit: Payer: Self-pay | Admitting: Cardiovascular Disease

## 2017-07-18 ENCOUNTER — Other Ambulatory Visit: Payer: Self-pay | Admitting: Cardiovascular Disease

## 2017-07-29 ENCOUNTER — Ambulatory Visit (INDEPENDENT_AMBULATORY_CARE_PROVIDER_SITE_OTHER): Payer: Medicare Other | Admitting: *Deleted

## 2017-07-29 DIAGNOSIS — I4891 Unspecified atrial fibrillation: Secondary | ICD-10-CM | POA: Diagnosis not present

## 2017-07-29 DIAGNOSIS — Z5181 Encounter for therapeutic drug level monitoring: Secondary | ICD-10-CM

## 2017-07-29 DIAGNOSIS — Z7901 Long term (current) use of anticoagulants: Secondary | ICD-10-CM | POA: Diagnosis not present

## 2017-07-29 LAB — POCT INR: INR: 2.6

## 2017-07-29 NOTE — Patient Instructions (Signed)
Continue coumadin 1 tablet daily except 1/2 tablet on Saturdays Continue  [facility] Recheck in 6 weeks

## 2017-08-14 ENCOUNTER — Other Ambulatory Visit: Payer: Self-pay | Admitting: Cardiovascular Disease

## 2017-08-14 NOTE — Telephone Encounter (Signed)
REFILL 

## 2017-08-25 ENCOUNTER — Encounter: Payer: Self-pay | Admitting: Cardiovascular Disease

## 2017-08-25 ENCOUNTER — Ambulatory Visit: Payer: Medicare Other | Admitting: Cardiovascular Disease

## 2017-08-25 VITALS — BP 146/84 | HR 79 | Ht 72.0 in | Wt 203.0 lb

## 2017-08-25 DIAGNOSIS — I453 Trifascicular block: Secondary | ICD-10-CM | POA: Diagnosis not present

## 2017-08-25 DIAGNOSIS — Z7901 Long term (current) use of anticoagulants: Secondary | ICD-10-CM | POA: Diagnosis not present

## 2017-08-25 DIAGNOSIS — E785 Hyperlipidemia, unspecified: Secondary | ICD-10-CM

## 2017-08-25 DIAGNOSIS — Z79899 Other long term (current) drug therapy: Secondary | ICD-10-CM

## 2017-08-25 DIAGNOSIS — I481 Persistent atrial fibrillation: Secondary | ICD-10-CM | POA: Diagnosis not present

## 2017-08-25 DIAGNOSIS — I5032 Chronic diastolic (congestive) heart failure: Secondary | ICD-10-CM

## 2017-08-25 DIAGNOSIS — I10 Essential (primary) hypertension: Secondary | ICD-10-CM | POA: Diagnosis not present

## 2017-08-25 DIAGNOSIS — I25118 Atherosclerotic heart disease of native coronary artery with other forms of angina pectoris: Secondary | ICD-10-CM

## 2017-08-25 DIAGNOSIS — I4819 Other persistent atrial fibrillation: Secondary | ICD-10-CM

## 2017-08-25 NOTE — Progress Notes (Signed)
[name], male   DOB: Mar 24, 1932, 84 y.o.   MRN: [id number] [name], male   DOB: [birth year], 84 y.o.   MRN: [id number]    Cardiology Office Note    Date:  08/26/2017   ID:  Jerry, Cain 10/20/1931, MRN [id number]  PCP:  Jerry Graves, DO  Cardiologist:   Jerry Klein, MD   Chief Complaint  Patient presents with  . Follow-up    History of Present Illness:  Jerry Cain is a 84 y.o. male with CAD and paroxysmal atrial fibrillation, on warfarin anticoagulation, trifascicular block and diastolic heart failure returning for follow-up.  He carefully monitors his blood pressure, heart rate and weight. He is very diligent about his sodium intake. He mostly eats vegetables that he grows in his own garden. Usual blood pressure is in the 120/70-130/73 range. He denies orthopnea, PND or exertional dyspnea. He has occasional edema and will take furosemide 20 mg about once weekly. He does yard work without difficulty. He denies dizziness, palpitations or syncope. He is unaware of the fact that he is in atrial fibrillation today.  In 2009, he received drug eluting Cypher stents to the LAD and the circumflex coronary arteries. Comorbidity includes systemic hypertension and hyperlipidemia both of which are adequately treated. By echocardiography in 2013 and in August 2017 he has preserved left ventricular systolic function and no valvular abnormalities. He had a normal stress nuclear myocardial scan in September of 2013. He has not had catheterization since 2009.   Past Medical History:  Diagnosis Date  . Adenomatous colon polyp 2001  . Anxiety   . Atrial fibrillation ([place])    takes Coumadin daily  . BPH (benign prostatic hyperplasia)    takes Proscar daily  . CAD (coronary artery disease) 2009   cardiac cath and PTCA by Dr. Tami Ribas  . Carpal tunnel syndrome    right  . Complication of anesthesia    hard to wake up  .  Diverticulosis 2007   tcs by Dr. Laural Golden  . GERD (gastroesophageal reflux disease)   . Gout   . Hypercholesterolemia    takes Atorvastatin daily  . Hypertension    takes Imdur,Coreg,and Lisinopril daily  . Hypothyroidism    takes SYnthroid daily  . Joint pain   . Joint swelling   . Nocturia   . Numbness    right hand pointer and middle finger  . Osteoarthritis    left knee  . Pneumonia    many,many yrs ago  . Tubular adenoma 2001    Past Surgical History:  Procedure Laterality Date  . Barbie Banner OSTEOTOMY Right 03/17/2013   Procedure: Barbie Banner OSTEOTOMY;  Surgeon: Marcheta Grammes, DPM;  Location: [facility];  Service: Orthopedics;  Laterality: Right;  . BUNIONECTOMY Right 03/17/2013   Procedure: BUNIONECTOMY, Arthroplasty 2nd toe right foot;  Surgeon: Marcheta Grammes, DPM;  Location: [facility];  Service: Orthopedics;  Laterality: Right;  . CARDIAC CATHETERIZATION  06/11/2007   PTCA X2 in 06/2007:  2 overlapping Cypher stents to LAD (2.5x13 and 3.0x23)  . CARDIAC CATHETERIZATION  06/21/2007   Cypher stent 2.5 x 13  to OM branch  . CARDIOVERSION  12/12/2011   Procedure: CARDIOVERSION;  Surgeon: Jerry Klein, MD;  Location: [facility];  Service: Cardiovascular;  Laterality: N/A;  . CATARACT EXTRACTION     bilateral  . COLONOSCOPY  2007   Dr. Laural Golden- L sided diverticulosis. Next TCS 2012 due to h/o tubular adenoma.  Marland Kitchen  COLONOSCOPY  02/26/2011   Procedure: COLONOSCOPY;  Surgeon: Daneil Dolin, MD;  Location: [facility];  Service: Endoscopy;  Laterality: N/A;  11:05  . CORONARY ANGIOPLASTY     x 3   . FLEXOR TENOTOMY Left 03/17/2013   Procedure: PERCUTANEOUS FLEXOR TENOTOMY 3RD TOE LEFT FOOT;  Surgeon: Marcheta Grammes, DPM;  Location: [facility];  Service: Orthopedics;  Laterality: Left;  . HERNIA REPAIR  2009   left inguinal  . rot Left   . TEE WITHOUT CARDIOVERSION  12/12/2011   Procedure: TRANSESOPHAGEAL ECHOCARDIOGRAM (TEE);  Surgeon: Jerry Klein, MD;  Location:  [facility] ENDOSCOPY;  Service: Cardiovascular;  Laterality: N/A;   successful/sinus rhythm  . TOTAL KNEE ARTHROPLASTY Left 06/14/2013   Procedure: TOTAL KNEE ARTHROPLASTY;  Surgeon: Garald Balding, MD;  Location: [facility];  Service: Orthopedics;  Laterality: Left;    Outpatient Medications Prior to Visit  Medication Sig Dispense Refill  . Calcium Carbonate-Vitamin D (CALCIUM + D) 600-200 MG-UNIT TABS Take 1 tablet by mouth 2 (two) times daily.     . carvedilol (COREG) 25 MG tablet TAKE (1) TABLET BY MOUTH TWICE DAILY WITH A MEAL. 180 tablet 3  . finasteride (PROSCAR) 5 MG tablet Take 5 mg by mouth every morning.     . furosemide (LASIX) 20 MG tablet Take 20 mg by mouth every other day.    . Glucosamine 750 MG TABS Take 750 mg by mouth 2 (two) times daily.    . isosorbide mononitrate (IMDUR) 60 MG 24 hr tablet Take 1 tablet (60 mg total) by mouth daily. KEEP OV. 30 tablet 1  . levothyroxine (SYNTHROID, LEVOTHROID) 125 MCG tablet Take 125 mcg by mouth daily before breakfast.     . lisinopril (PRINIVIL,ZESTRIL) 20 MG tablet Take 1 tablet (20 mg total) by mouth daily. KEEP OV. 30 tablet 0  . Multiple Vitamin (MULTIVITAMIN) capsule Take 1 capsule by mouth every morning.     . nitroGLYCERIN (NITROSTAT) 0.4 MG SL tablet DISSOLVE 1 TABLET UNDER TONGUE EVERY 5 MINUTES UP TO 15 MIN FOR CHESTPAIN. IF NO RELIEF CALL 911. 25 tablet 0  . rosuvastatin (CRESTOR) 10 MG tablet TAKE (1) TABLET BY MOUTH ONCE A WEEK. 4 tablet 3  . traMADol (ULTRAM) 50 MG tablet Take 50 mg by mouth every 6 (six) hours as needed.     . warfarin (COUMADIN) 5 MG tablet TAKE 1 TABLET BY MOUTH DAILY OR AS DIRECTED BY COUMADIN CLINIC. 90 tablet 1  . isosorbide mononitrate (IMDUR) 60 MG 24 hr tablet Take 1 tablet (60 mg total) by mouth daily. KEEP OV. 30 tablet 0  . lisinopril (PRINIVIL,ZESTRIL) 20 MG tablet TAKE ONE TABLET BY MOUTH AT BEDTIME (8PM). 30 tablet 2   No facility-administered medications prior to visit.      Allergies:    Carbapenems; Cephalosporins; and Penicillins   Social History   Socioeconomic History  . Marital status: Married    Spouse name: Not on file  . Number of children: 4  . Years of education: Not on file  . Highest education level: Not on file  Occupational History  . Occupation: retired  Scientific laboratory technician  . Financial resource strain: Not on file  . Food insecurity:    Worry: Not on file    Inability: Not on file  . Transportation needs:    Medical: Not on file    Non-medical: Not on file  Tobacco Use  . Smoking status: Never Smoker  . Smokeless tobacco: Never Used  Substance and Sexual Activity  . Alcohol use: No  . Drug use: No  . Sexual activity: Not on file  Lifestyle  . Physical activity:    Days per week: Not on file    Minutes per session: Not on file  . Stress: Not on file  Relationships  . Social connections:    Talks on phone: Not on file    Gets together: Not on file    Attends religious service: Not on file    Active member of club or organization: Not on file    Attends meetings of clubs or organizations: Not on file    Relationship status: Not on file  Other Topics Concern  . Not on file  Social History Narrative  . Not on file     Family History:  The patient's family history includes Heart disease in his brother; Pneumonia in his father.   ROS:   Please see the history of present illness.    ROS All other systems reviewed and are negative.   PHYSICAL EXAM:   VS:  BP (!) 146/84   Pulse 79   Ht 6' (1.829 m)   Wt 203 lb (92.1 kg)   BMI 27.53 kg/m     General: Alert, oriented x3, no distress, overweight Head: no evidence of trauma, PERRL, EOMI, no exophtalmos or lid lag, no myxedema, no xanthelasma; normal ears, nose and oropharynx Neck: normal jugular venous pulsations and no hepatojugular reflux; brisk carotid pulses without delay and no carotid bruits Chest: clear to auscultation, no signs of consolidation by percussion or palpation, normal  fremitus, symmetrical and full respiratory excursions Cardiovascular: normal position and quality of the apical impulse, irregular rhythm, normal first and widely split second heart sounds, no murmurs, rubs or gallops Abdomen: no tenderness or distention, no masses by palpation, no abnormal pulsatility or arterial bruits, normal bowel sounds, no hepatosplenomegaly Extremities: no clubbing, cyanosis or edema; 2+ radial, ulnar and brachial pulses bilaterally; 2+ right femoral, posterior tibial and dorsalis pedis pulses; 2+ left femoral, posterior tibial and dorsalis pedis pulses; no subclavian or femoral bruits Neurological: grossly nonfocal Psych: Normal mood and affect   Wt Readings from Last 3 Encounters:  08/25/17 203 lb (92.1 kg)  08/14/16 192 lb 9.6 oz (87.4 kg)  06/05/16 203 lb (92.1 kg)      Studies/Labs Reviewed:   EKG:  EKG is ordered today.  It shows atrial fibrillation, controlled rate, old RBBB, QTc 460 ms  Lipid Panel    Component Value Date/Time   CHOL 107 06/12/2016 0929   TRIG 82 06/12/2016 0929   HDL 33 (L) 06/12/2016 0929   CHOLHDL 3.2 06/12/2016 0929   VLDL 16 06/12/2016 0929   LDLCALC 58 06/12/2016 0929    ASSESSMENT:    1. Chronic diastolic heart failure (HCC)   2. Persistent atrial fibrillation ([place])   3. Trifascicular block   4. Coronary artery disease of native artery of native heart with stable angina pectoris ([place])   5. Dyslipidemia   6. Essential hypertension   7. Long term (current) use of anticoagulants   8. Medication management      PLAN:  In order of problems listed above:  1. CHF: preserved LVEF, NYHA functional class I, euvolemic, on occasional dose of loop diuretic. 2. AFib: Rate controlled. CHADSVasc score is 4 (age 7, HTN, CAD, CHF). He has never had a stroke or TIA. Arrhythmia is asymptomatic. May be a long-term persistent atrial fibrillation by now. 3. Trifascicular block: (before,  in SR had long PR and left axis deviation). No  syncope or bradycardia to suggest higher grade AV block. Avoid adding any other negative chronotropic score antiarrhythmics because of the conduction abnormalities 4. CAD: CCS class 1 stable angina on 2 antianginal meds (vague tightness walking fast up a steep hill only). He has not taken nitroglycerin in a very long time.  5. HLP: Lipid profile at target, LDL <70. Encouraged weight loss to boost low HDL. 6. HTN: Good blood pressure control (high today, but well documented less than 130/80 at home). 7. Warfarin anticoagulation: Well-tolerated without bleeding complications.      Medication Adjustments/Labs and Tests Ordered: Current medicines are reviewed at length with the patient today.  Concerns regarding medicines are outlined above.  Medication changes, Labs and Tests ordered today are listed in the Patient Instructions below. Patient Instructions  Dr Sallyanne Kuster recommends that you continue on your current medications as directed. Please refer to the Current Medication list given to you today.  Your physician recommends that you return for lab work at your earliest [place].  Dr Sallyanne Kuster recommends that you schedule a follow-up appointment in 12 months. You will receive a reminder letter in the mail two months in advance. If you don't receive a letter, please call our office to schedule the follow-up appointment.  If you need a refill on your cardiac medications before your next appointment, please call your pharmacy. Signed, Jerry Klein, MD  08/26/2017 4:07 PM    [facility] [place], [place], [place]  [address] Phone: [phone]; Fax: [phone]

## 2017-08-25 NOTE — Patient Instructions (Signed)
Dr Croitoru recommends that you continue on your current medications as directed. Please refer to the Current Medication list given to you today.  Your physician recommends that you return for lab work at your earliest convenience - FASTING.  Dr Croitoru recommends that you schedule a follow-up appointment in 12 months. You will receive a reminder letter in the mail two months in advance. If you don't receive a letter, please call our office to schedule the follow-up appointment.  If you need a refill on your cardiac medications before your next appointment, please call your pharmacy. 

## 2017-08-26 ENCOUNTER — Encounter: Payer: Self-pay | Admitting: Cardiovascular Disease

## 2017-08-28 LAB — LIPID PANEL
Chol/HDL Ratio: 3.4 ratio (ref 0.0–5.0)
Cholesterol, Total: 117 mg/dL (ref 100–199)
HDL: 34 mg/dL — ABNORMAL LOW (ref >39)
LDL CALC: 66 mg/dL (ref 0–99)
Triglycerides: 83 mg/dL (ref 0–149)
VLDL CHOLESTEROL CAL: 17 mg/dL (ref 5–40)

## 2017-08-28 LAB — CBC
HEMATOCRIT: 38.3 % (ref 37.5–51.0)
HEMOGLOBIN: 13.1 g/dL (ref 13.0–17.7)
MCH: 30.9 pg (ref 26.6–33.0)
MCHC: 34.2 g/dL (ref 31.5–35.7)
MCV: 90 fL (ref 79–97)
Platelets: 183 10*3/uL (ref 150–450)
RBC: 4.24 x10E6/uL (ref 4.14–5.80)
RDW: 15.4 % (ref 12.3–15.4)
WBC: 4.9 10*3/uL (ref 3.4–10.8)

## 2017-08-28 LAB — COMPREHENSIVE METABOLIC PANEL
ALBUMIN: 4.1 g/dL (ref 3.5–4.7)
ALT: 22 IU/L (ref 0–44)
AST: 22 IU/L (ref 0–40)
Albumin/Globulin Ratio: 1.5 (ref 1.2–2.2)
Alkaline Phosphatase: 59 IU/L (ref 39–117)
BILIRUBIN TOTAL: 1 mg/dL (ref 0.0–1.2)
BUN / CREAT RATIO: 17 (ref 10–24)
BUN: 18 mg/dL (ref 8–27)
CO2: 23 mmol/L (ref 20–29)
CREATININE: 1.08 mg/dL (ref 0.76–1.27)
Calcium: 9.2 mg/dL (ref 8.6–10.2)
Chloride: 107 mmol/L — ABNORMAL HIGH (ref 96–106)
GFR calc non Af Amer: 63 mL/min/{1.73_m2} (ref >59)
GFR, EST AFRICAN AMERICAN: 73 mL/min/{1.73_m2} (ref >59)
GLUCOSE: 89 mg/dL (ref 65–99)
Globulin, Total: 2.7 g/dL (ref 1.5–4.5)
Potassium: 4.1 mmol/L (ref 3.5–5.2)
SODIUM: 142 mmol/L (ref 134–144)
TOTAL PROTEIN: 6.8 g/dL (ref 6.0–8.5)

## 2017-09-03 ENCOUNTER — Telehealth: Payer: Self-pay | Admitting: Cardiovascular Disease

## 2017-09-03 NOTE — Telephone Encounter (Signed)
Patient notified of results.

## 2017-09-03 NOTE — Telephone Encounter (Signed)
Notes recorded by Sanda Klein, MD on 08/28/2017 at 7:56 AM EDT Excellent cholesterol profile except for chronically low HDL cholesterol. This can improved with physical activity and weight loss but there is no need to change medications. Other labs look great.

## 2017-09-03 NOTE — Telephone Encounter (Signed)
Pt calling and stating he is returning call to a nurse that called him.please call pt.

## 2017-09-05 DEATH — deceased

## 2017-09-08 ENCOUNTER — Other Ambulatory Visit: Payer: Self-pay | Admitting: Cardiovascular Disease

## 2017-09-09 ENCOUNTER — Ambulatory Visit (INDEPENDENT_AMBULATORY_CARE_PROVIDER_SITE_OTHER): Payer: Medicare Other | Admitting: *Deleted

## 2017-09-09 DIAGNOSIS — I4891 Unspecified atrial fibrillation: Secondary | ICD-10-CM | POA: Diagnosis not present

## 2017-09-09 DIAGNOSIS — Z5181 Encounter for therapeutic drug level monitoring: Secondary | ICD-10-CM | POA: Diagnosis not present

## 2017-09-09 DIAGNOSIS — Z7901 Long term (current) use of anticoagulants: Secondary | ICD-10-CM

## 2017-09-09 LAB — POCT INR: INR: 3.4 — AB (ref 2.0–3.0)

## 2017-09-09 NOTE — Patient Instructions (Signed)
Hold coumadin tomorrow then resume 1 tablet daily except 1/2 tablet on Saturdays Took coumadin this morning Continue  [facility] Recheck in 4 weeks

## 2017-09-18 ENCOUNTER — Other Ambulatory Visit: Payer: Self-pay | Admitting: Cardiovascular Disease

## 2017-10-07 ENCOUNTER — Other Ambulatory Visit: Payer: Self-pay | Admitting: Cardiovascular Disease

## 2017-10-07 ENCOUNTER — Ambulatory Visit (INDEPENDENT_AMBULATORY_CARE_PROVIDER_SITE_OTHER): Payer: Medicare Other | Admitting: *Deleted

## 2017-10-07 DIAGNOSIS — I4891 Unspecified atrial fibrillation: Secondary | ICD-10-CM | POA: Diagnosis not present

## 2017-10-07 DIAGNOSIS — Z7901 Long term (current) use of anticoagulants: Secondary | ICD-10-CM

## 2017-10-07 DIAGNOSIS — Z5181 Encounter for therapeutic drug level monitoring: Secondary | ICD-10-CM | POA: Diagnosis not present

## 2017-10-07 LAB — POCT INR: INR: 2.6 (ref 2.0–3.0)

## 2017-10-07 NOTE — Patient Instructions (Signed)
Continue coumadin 1 tablet daily except 1/2 tablet on Saturdays Took coumadin this morning Continue  [facility] Recheck in 4 weeks

## 2017-10-10 ENCOUNTER — Other Ambulatory Visit: Payer: Self-pay | Admitting: Cardiovascular Disease

## 2017-10-12 NOTE — Telephone Encounter (Signed)
Rx sent to pharmacy   

## 2017-11-04 ENCOUNTER — Ambulatory Visit (INDEPENDENT_AMBULATORY_CARE_PROVIDER_SITE_OTHER): Payer: Medicare Other | Admitting: *Deleted

## 2017-11-04 ENCOUNTER — Telehealth: Payer: Self-pay | Admitting: Cardiovascular Disease

## 2017-11-04 DIAGNOSIS — Z7901 Long term (current) use of anticoagulants: Secondary | ICD-10-CM

## 2017-11-04 DIAGNOSIS — I4891 Unspecified atrial fibrillation: Secondary | ICD-10-CM

## 2017-11-04 DIAGNOSIS — Z5181 Encounter for therapeutic drug level monitoring: Secondary | ICD-10-CM | POA: Diagnosis not present

## 2017-11-04 LAB — POCT INR: INR: 3.4 — AB (ref 2.0–3.0)

## 2017-11-04 NOTE — Telephone Encounter (Signed)
°  Pt stated he had soreness in his chest and took 1 fluid pill yesterday and lost 12 lbs within 24 hrs. Pt isn't having any SOB or chest pain. Please call to advise.

## 2017-11-04 NOTE — Telephone Encounter (Signed)
Returned call to patient of Dr. Loletha Grayer. He states his chest was sore d/t being volume overloaded. He took lasix yesterday and lost 12 lbs overnight. He is feeling better now. Advised to continue to weigh daily.

## 2017-11-04 NOTE — Patient Instructions (Signed)
Hold coumadin tomorrow then resume 1 tablet daily except 1/2 tablet on Saturdays Took coumadin this morning Eat extra greens today Recheck in 3 weeks

## 2017-11-06 ENCOUNTER — Telehealth: Payer: Self-pay | Admitting: Cardiovascular Disease

## 2017-11-06 NOTE — Telephone Encounter (Signed)
New Message          Pt c/o BP issue: STAT if pt c/o blurred vision, one-sided weakness or slurred speech  1. What are your last 5 BP readings?       122/70       102/65        HR 75        102/60       HR 83 all taken today          2. Are you having any other symptoms (ex. Dizziness, headache, blurred vision, passed out)? Soreness in chest  3. What is your BP issue? B/P going up and down

## 2017-11-06 NOTE — Telephone Encounter (Signed)
Patient called in calling regarding his BP. States this morning his BP was 122/70 HR 86 when he woke up.  He took his morning medications: Carvedilol(only one tablet for the morning), Lasix, Levothyroxine, Imdur, Proscar, Warfarin.   Later in the morning BP was 102/65 HR 71, at 1:00 this afternoon it was 102/60 HR 83. Patient checked BP while on the phone with me at 2:50 pm it was 124/71 HR 81.   Patient has no other symptoms other than the BP jumping around.   I advised patient to monitor his BP throughout the day, if BP is around 100/60 after he took his 2nd carvedilol dose this evening, to hold his Lisinopril dose this evening and recheck BP tomorrow.   Advised patient to contact our office first thing on Monday to let us know how BP continued to be over the weekend, and if patient was unsure of if to take medications he could call our on-call physician to gain clarity.   Patient had no questions or concerns. Wanted to make you aware, to make sure you agreed with upcoming plan this weekend.   Thanks!

## 2017-11-07 ENCOUNTER — Other Ambulatory Visit: Payer: Self-pay | Admitting: Cardiovascular Disease

## 2017-11-09 NOTE — Telephone Encounter (Signed)
Agree with plan Brian Crenshaw  

## 2017-11-10 ENCOUNTER — Telehealth: Payer: Self-pay | Admitting: Cardiovascular Disease

## 2017-11-10 NOTE — Telephone Encounter (Signed)
New Message   Patient is calling and requested that a message be sent to the nurse to advise that he was able to take his BP medicine.

## 2017-11-10 NOTE — Telephone Encounter (Signed)
Pt called to report his BP yesterday and today as we requested on his 11/06/17 phone note... BP yesterday in the am was 142/73 and pm 133/74... Today 6am 126/72 and 8am 126/80.  He reports that he is feeling well.   I advised him to continue his meds. He has been back on the evening Lisinopril all weekend. Pt to continue to monitor and will let us know if any changes or any further problems that occur. Pt agrees and will call if he needs [place]. Will forward to Dr. Stanford Breed and will call pt back if he recommends any new changes.

## 2017-11-16 ENCOUNTER — Other Ambulatory Visit: Payer: Self-pay | Admitting: Cardiovascular Disease

## 2017-11-25 ENCOUNTER — Ambulatory Visit (INDEPENDENT_AMBULATORY_CARE_PROVIDER_SITE_OTHER): Payer: Medicare Other | Admitting: *Deleted

## 2017-11-25 DIAGNOSIS — I481 Persistent atrial fibrillation: Secondary | ICD-10-CM

## 2017-11-25 DIAGNOSIS — Z7901 Long term (current) use of anticoagulants: Secondary | ICD-10-CM

## 2017-11-25 DIAGNOSIS — I4819 Other persistent atrial fibrillation: Secondary | ICD-10-CM

## 2017-11-25 DIAGNOSIS — Z5181 Encounter for therapeutic drug level monitoring: Secondary | ICD-10-CM | POA: Diagnosis not present

## 2017-11-25 DIAGNOSIS — I4891 Unspecified atrial fibrillation: Secondary | ICD-10-CM

## 2017-11-25 LAB — POCT INR: INR: 3.5 — AB (ref 2.0–3.0)

## 2017-11-25 NOTE — Patient Instructions (Signed)
Description   Skip tomorrow's dose, then continue taking 1 tablet daily except 1/2 tablet on Saturdays. Resume your normal leafy green vegetable intake. Recheck in 2 weeks.

## 2017-12-11 ENCOUNTER — Ambulatory Visit (INDEPENDENT_AMBULATORY_CARE_PROVIDER_SITE_OTHER): Payer: Medicare Other | Admitting: *Deleted

## 2017-12-11 DIAGNOSIS — I4891 Unspecified atrial fibrillation: Secondary | ICD-10-CM | POA: Diagnosis not present

## 2017-12-11 DIAGNOSIS — Z5181 Encounter for therapeutic drug level monitoring: Secondary | ICD-10-CM

## 2017-12-11 DIAGNOSIS — Z7901 Long term (current) use of anticoagulants: Secondary | ICD-10-CM | POA: Diagnosis not present

## 2017-12-11 LAB — POCT INR: INR: 3.1 — AB (ref 2.0–3.0)

## 2017-12-11 NOTE — Patient Instructions (Signed)
Decrease coumadin to 1 tablet daily except 1/2 tablet on Tuesdays and Saturdays. Resume your normal leafy green vegetable intake. Recheck in 4 weeks.

## 2018-01-06 ENCOUNTER — Ambulatory Visit (INDEPENDENT_AMBULATORY_CARE_PROVIDER_SITE_OTHER): Payer: Medicare Other | Admitting: *Deleted

## 2018-01-06 DIAGNOSIS — I4891 Unspecified atrial fibrillation: Secondary | ICD-10-CM

## 2018-01-06 DIAGNOSIS — Z5181 Encounter for therapeutic drug level monitoring: Secondary | ICD-10-CM

## 2018-01-06 DIAGNOSIS — Z7901 Long term (current) use of anticoagulants: Secondary | ICD-10-CM

## 2018-01-06 LAB — POCT INR: INR: 3.2 — AB (ref 2.0–3.0)

## 2018-01-06 NOTE — Patient Instructions (Signed)
Did not decrease dose after last visit. Decrease coumadin to 1 tablet daily except 1/2 tablet on Tuesdays and Saturdays. Resume your normal leafy green vegetable intake. Recheck in 4 weeks.

## 2018-02-08 ENCOUNTER — Ambulatory Visit (INDEPENDENT_AMBULATORY_CARE_PROVIDER_SITE_OTHER): Payer: Medicare Other | Admitting: *Deleted

## 2018-02-08 DIAGNOSIS — Z7901 Long term (current) use of anticoagulants: Secondary | ICD-10-CM

## 2018-02-08 DIAGNOSIS — I4891 Unspecified atrial fibrillation: Secondary | ICD-10-CM

## 2018-02-08 DIAGNOSIS — Z5181 Encounter for therapeutic drug level monitoring: Secondary | ICD-10-CM | POA: Diagnosis not present

## 2018-02-08 LAB — POCT INR: INR: 2.4 (ref 2.0–3.0)

## 2018-02-08 NOTE — Patient Instructions (Signed)
Continue coumadin 1 tablet daily except 1/2 tablet on Tuesdays and Saturdays. Resume your normal leafy green vegetable intake. Recheck in 4 weeks.

## 2018-03-06 ENCOUNTER — Other Ambulatory Visit: Payer: Self-pay | Admitting: Cardiovascular Disease

## 2018-03-08 ENCOUNTER — Ambulatory Visit (INDEPENDENT_AMBULATORY_CARE_PROVIDER_SITE_OTHER): Payer: Medicare Other | Admitting: *Deleted

## 2018-03-08 DIAGNOSIS — Z7901 Long term (current) use of anticoagulants: Secondary | ICD-10-CM

## 2018-03-08 DIAGNOSIS — Z5181 Encounter for therapeutic drug level monitoring: Secondary | ICD-10-CM | POA: Diagnosis not present

## 2018-03-08 DIAGNOSIS — I4891 Unspecified atrial fibrillation: Secondary | ICD-10-CM | POA: Diagnosis not present

## 2018-03-08 LAB — POCT INR: INR: 1.7 — AB (ref 2.0–3.0)

## 2018-03-08 NOTE — Patient Instructions (Signed)
Take coumadin 1 extra tablet today then resume 1 tablet daily except 1/2 tablet on Tuesdays and Saturdays. Resume your normal leafy green vegetable intake. Recheck in 3 weeks.

## 2018-03-08 NOTE — Addendum Note (Signed)
Addended by: Malen Gauze on: 03/08/2018 10:55 AM   Modules accepted: Level of Service

## 2018-03-18 ENCOUNTER — Encounter: Payer: Self-pay | Admitting: Cardiovascular Disease

## 2018-03-18 ENCOUNTER — Ambulatory Visit: Payer: Medicare Other | Admitting: Cardiovascular Disease

## 2018-03-18 VITALS — BP 138/76 | HR 82 | Ht 72.0 in | Wt 210.0 lb

## 2018-03-18 DIAGNOSIS — R0602 Shortness of breath: Secondary | ICD-10-CM | POA: Diagnosis not present

## 2018-03-18 DIAGNOSIS — E7800 Pure hypercholesterolemia, unspecified: Secondary | ICD-10-CM

## 2018-03-18 DIAGNOSIS — I5033 Acute on chronic diastolic (congestive) heart failure: Secondary | ICD-10-CM

## 2018-03-18 DIAGNOSIS — I25118 Atherosclerotic heart disease of native coronary artery with other forms of angina pectoris: Secondary | ICD-10-CM | POA: Diagnosis not present

## 2018-03-18 DIAGNOSIS — I4819 Other persistent atrial fibrillation: Secondary | ICD-10-CM

## 2018-03-18 DIAGNOSIS — I453 Trifascicular block: Secondary | ICD-10-CM

## 2018-03-18 DIAGNOSIS — Z7901 Long term (current) use of anticoagulants: Secondary | ICD-10-CM

## 2018-03-18 DIAGNOSIS — I10 Essential (primary) hypertension: Secondary | ICD-10-CM

## 2018-03-18 NOTE — Progress Notes (Signed)
Patient ID: CHANDON LAZCANO, male   DOB: October 24, 1931, 84 y.o.   MRN: [id number] Patient ID: DENNIE MOLTZ, male   DOB: 1932/03/20, 84 y.o.   MRN: [id number]    Cardiology Office Note    Date:  03/18/2018   ID:  ABELINO TIPPIN, DOB Sep 03, 1931, MRN [id number]  PCP:  Octavio Graves, DO  Cardiologist:   Sanda Klein, MD   Chief Complaint  Patient presents with  . Follow-up  . Shortness of Breath  . Edema    History of Present Illness:  Jerry Cain is a 84 y.o. male with CAD and paroxysmal atrial fibrillation, on warfarin anticoagulation, trifascicular block and diastolic heart failure returning for follow-up.  Recently he has not been doing as well.  He had to stop swimming due to a nonhealing ulceration between his toes.  He is also had a lot of problems with pain in his right knee and has not been able to walk.  He has gained about 7 pounds on our office scale.  He reports that he weighs a lot less on his home scale, only 196 pounds today (but he also reports that this has been a consistent difference between our 2 scales).  He reports exertional dyspnea and marked shortness of breath when he bends over to tie his right shoe (interestingly not as bad for the left side).  He does not have frank orthopnea or PND.  He has developed swelling of his ankles and the lower half of his calves symmetrically.  He notes that he develops early satiety.  This happens with every meal and he notices increased abdominal girth especially on the right side.  He was having some problems with dizziness and started eating peanut butter crackers.  He noticed that this led to weight gain.  He decided to switch from peanut butter crackers to canned soups.  His dizziness has resolved.  He is not clear that there was an association between dizziness and low blood pressure.  He has been monitoring his blood pressure at home and it has varied widely between 115/72-174/94, but usually in the  130s/80s.  Last week he started taking furosemide every other day and his breathing has improved.  Before that he was only taking furosemide about once a week.  He is concerned that he may have developed coronary blockages.  He reports that he presented as shortness of breath in the past.  He has not had angina recently, although this was occurring when he would climb a hill (he has been less active due to previous knee pain recently).  In 2009, he received drug eluting Cypher stents to the LAD and the circumflex coronary arteries. Comorbidity includes systemic hypertension and hyperlipidemia both of which are adequately treated. By echocardiography in 2013 and in August 2017 he has preserved left ventricular systolic function and no valvular abnormalities. He had a normal stress nuclear myocardial scan in September of 2013. He has not had catheterization since 2009.   Past Medical History:  Diagnosis Date  . Adenomatous colon polyp 2001  . Anxiety   . Atrial fibrillation ([place])    takes Coumadin daily  . BPH (benign prostatic hyperplasia)    takes Proscar daily  . CAD (coronary artery disease) 2009   cardiac cath and PTCA by Dr. Tami Ribas  . Carpal tunnel syndrome    right  . Complication of anesthesia    hard to wake up  . Diverticulosis 2007   tcs by Dr. Laural Golden  .  GERD (gastroesophageal reflux disease)   . Gout   . Hypercholesterolemia    takes Atorvastatin daily  . Hypertension    takes Imdur,Coreg,and Lisinopril daily  . Hypothyroidism    takes SYnthroid daily  . Joint pain   . Joint swelling   . Nocturia   . Numbness    right hand pointer and middle finger  . Osteoarthritis    left knee  . Pneumonia    many,many yrs ago  . Tubular adenoma 2001    Past Surgical History:  Procedure Laterality Date  . Barbie Banner OSTEOTOMY Right 03/17/2013   Procedure: Barbie Banner OSTEOTOMY;  Surgeon: Marcheta Grammes, DPM;  Location: [facility];  Service: Orthopedics;  Laterality: Right;  .  BUNIONECTOMY Right 03/17/2013   Procedure: BUNIONECTOMY, Arthroplasty 2nd toe right foot;  Surgeon: Marcheta Grammes, DPM;  Location: [facility];  Service: Orthopedics;  Laterality: Right;  . CARDIAC CATHETERIZATION  06/11/2007   PTCA X2 in 06/2007:  2 overlapping Cypher stents to LAD (2.5x13 and 3.0x23)  . CARDIAC CATHETERIZATION  06/21/2007   Cypher stent 2.5 x 13  to OM branch  . CARDIOVERSION  12/12/2011   Procedure: CARDIOVERSION;  Surgeon: Sanda Klein, MD;  Location: [facility];  Service: Cardiovascular;  Laterality: N/A;  . CATARACT EXTRACTION     bilateral  . COLONOSCOPY  2007   Dr. Laural Golden- L sided diverticulosis. Next TCS 2012 due to h/o tubular adenoma.  . COLONOSCOPY  02/26/2011   Procedure: COLONOSCOPY;  Surgeon: Daneil Dolin, MD;  Location: [facility];  Service: Endoscopy;  Laterality: N/A;  11:05  . CORONARY ANGIOPLASTY     x 3   . FLEXOR TENOTOMY Left 03/17/2013   Procedure: PERCUTANEOUS FLEXOR TENOTOMY 3RD TOE LEFT FOOT;  Surgeon: Marcheta Grammes, DPM;  Location: [facility];  Service: Orthopedics;  Laterality: Left;  . HERNIA REPAIR  2009   left inguinal  . rot Left   . TEE WITHOUT CARDIOVERSION  12/12/2011   Procedure: TRANSESOPHAGEAL ECHOCARDIOGRAM (TEE);  Surgeon: Sanda Klein, MD;  Location: [facility];  Service: Cardiovascular;  Laterality: N/A;   successful/sinus rhythm  . TOTAL KNEE ARTHROPLASTY Left 06/14/2013   Procedure: TOTAL KNEE ARTHROPLASTY;  Surgeon: Garald Balding, MD;  Location: [facility];  Service: Orthopedics;  Laterality: Left;    Outpatient Medications Prior to Visit  Medication Sig Dispense Refill  . Calcium Carbonate-Vitamin D (CALCIUM + D) 600-200 MG-UNIT TABS Take 1 tablet by mouth 2 (two) times daily.     . carvedilol (COREG) 25 MG tablet TAKE (1) TABLET BY MOUTH TWICE DAILY WITH A MEAL. 180 tablet 2  . finasteride (PROSCAR) 5 MG tablet Take 5 mg by mouth every morning.     . furosemide (LASIX) 20 MG tablet Take 20 mg by mouth  every other day.    . Glucosamine 750 MG TABS Take 750 mg by mouth 2 (two) times daily.    . isosorbide mononitrate (IMDUR) 60 MG 24 hr tablet TAKE ONE TABLET ONCE DAILY IN THE MORNING. 90 tablet 3  . levothyroxine (SYNTHROID, LEVOTHROID) 125 MCG tablet Take 125 mcg by mouth daily before breakfast.     . lisinopril (PRINIVIL,ZESTRIL) 20 MG tablet Take 1 tablet (20 mg total) by mouth daily. KEEP OV. 30 tablet 0  . lisinopril (PRINIVIL,ZESTRIL) 20 MG tablet TAKE ONE TABLET BY MOUTH AT BEDTIME (8PM). 30 tablet 10  . Multiple Vitamin (MULTIVITAMIN) capsule Take 1 capsule by mouth every morning.     . nitroGLYCERIN (NITROSTAT) 0.4  MG SL tablet DISSOLVE 1 TABLET UNDER TONGUE EVERY 5 MINUTES UP TO 15 MIN FOR CHESTPAIN. IF NO RELIEF CALL 911. 25 tablet 6  . rosuvastatin (CRESTOR) 10 MG tablet TAKE (1) TABLET BY MOUTH ONCE A WEEK. 4 tablet 11  . traMADol (ULTRAM) 50 MG tablet Take 50 mg by mouth every 6 (six) hours as needed.     . warfarin (COUMADIN) 5 MG tablet TAKE 1 TABLET BY MOUTH DAILY OR AS DIRECTED BY COUMADIN CLINIC. 90 tablet 0   No facility-administered medications prior to visit.      Allergies:   Carbapenems; Cephalosporins; and Penicillins   Social History   Socioeconomic History  . Marital status: Married    Spouse name: Not on file  . Number of children: 4  . Years of education: Not on file  . Highest education level: Not on file  Occupational History  . Occupation: retired  Scientific laboratory technician  . Financial resource strain: Not on file  . Food insecurity:    Worry: Not on file    Inability: Not on file  . Transportation needs:    Medical: Not on file    Non-medical: Not on file  Tobacco Use  . Smoking status: Never Smoker  . Smokeless tobacco: Never Used  Substance and Sexual Activity  . Alcohol use: No  . Drug use: No  . Sexual activity: Not on file  Lifestyle  . Physical activity:    Days per week: Not on file    Minutes per session: Not on file  . Stress: Not on file   Relationships  . Social connections:    Talks on phone: Not on file    Gets together: Not on file    Attends religious service: Not on file    Active member of club or organization: Not on file    Attends meetings of clubs or organizations: Not on file    Relationship status: Not on file  Other Topics Concern  . Not on file  Social History Narrative  . Not on file     Family History:  The patient's family history includes Heart disease in his brother; Pneumonia in his father.   ROS:   Please see the history of present illness.    ROS All other systems are reviewed and are negative.  PHYSICAL EXAM:   VS:  BP 138/76 (BP Location: Left Arm, Patient Position: Sitting, Cuff Size: Normal)   Pulse 82   Ht 6' (1.829 m)   Wt 210 lb (95.3 kg)   BMI 28.48 kg/m      General: Alert, oriented x3, no distress, overweight Head: no evidence of trauma, PERRL, EOMI, no exophtalmos or lid lag, no myxedema, no xanthelasma; normal ears, nose and oropharynx Neck: 8-10 cm elevation in jugular venous pulsations and prompt hepatojugular reflux; brisk carotid pulses without delay and no carotid bruits Chest: clear to auscultation, no signs of consolidation by percussion or palpation, normal fremitus, symmetrical and full respiratory excursions Cardiovascular: normal position and quality of the apical impulse, irregular rhythm, normal first and second heart sounds, no murmurs, rubs or gallops Abdomen: no tenderness or distention, no masses by palpation, no abnormal pulsatility or arterial bruits, normal bowel sounds, hepatomegaly 2 cm below the right costal margin Extremities: no clubbing, cyanosis; there is symmetrical 1-2+ pitting edema halfway to the knees; 2+ radial, ulnar and brachial pulses bilaterally; 2+ right femoral, posterior tibial and dorsalis pedis pulses; 2+ left femoral, posterior tibial and dorsalis pedis pulses; no  subclavian or femoral bruits Neurological: grossly nonfocal Psych:  Normal mood and affect   Wt Readings from Last 3 Encounters:  03/18/18 210 lb (95.3 kg)  08/25/17 203 lb (92.1 kg)  08/14/16 192 lb 9.6 oz (87.4 kg)      Studies/Labs Reviewed:   EKG:  EKG is ordered today.  It shows atrial fibrillation, left anterior fascicular block, old right bundle branch block, QTC 490 ms  Lipid Panel    Component Value Date/Time   CHOL 117 08/27/2017 0820   TRIG 83 08/27/2017 0820   HDL 34 (L) 08/27/2017 0820   CHOLHDL 3.4 08/27/2017 0820   CHOLHDL 3.2 06/12/2016 0929   VLDL 16 06/12/2016 0929   LDLCALC 66 08/27/2017 0820   Labs April 2019 Total cholesterol 139, triglycerides 148, HDL 40, LDL 69 Creatinine 1.3, normal liver function tests  ASSESSMENT:    1. Shortness of breath   2. Acute on chronic diastolic heart failure ([place])   3. Coronary artery disease of native artery of native heart with stable angina pectoris (HCC)   4. Persistent atrial fibrillation   5. Trifascicular block   6. Hypercholesterolemia   7. Essential hypertension   8. Long term current use of anticoagulant      PLAN:  In order of problems listed above:  1. CHF: Previously euvolemic with very rare use of loop diuretics and functional class I, she now has functional class IIb-III dyspnea and multiple signs of hypervolemia including elevated jugular venous pulsations, hepatomegaly with a repeat satiety, leg edema.  His hypovolemia is likely due to her possible increased intake of sodium rich foods since he had issues with dizziness.  I do not think this was a good idea, but we will have to explore cutting back on the dose of his carvedilol or lisinopril if his ejection fraction is still normal.  Check echocardiogram.   2. CAD: Earlier in the year, CCS class 1 stable angina on 2 antianginal meds (vague tightness walking fast up a steep hill only). He has not taken nitroglycerin in a very long time.  He is now relatively sedentary since he cannot swim and has a lot of knee pain.   We will need to screen for recurrent coronary insufficiency.  He has not had any evaluation since 2013.  Schedule for [employer].  His knee prevents him from exercising on the treadmill. 3. AFib: This is well rate controlled and seemed to be well tolerated until recently.  If we do not identify CAD or other clear causes of heart failure exacerbation, might have to revisit possible cardioversion although I am not optimistic about maintenance of sinus rhythm.  CHADSVasc score is 4 (age 64, HTN, CAD, CHF). He has never had a stroke or TIA.  4. Trifascicular block: (before, in SR had long PR as well as RBBB and left axis deviation). No syncope or bradycardia to suggest higher grade AV block, but has had recent dizziness. Avoid adding any other negative chronotropic score antiarrhythmics because of the conduction abnormalities.  If we decide to pursue a rhythm control strategy with antiarrhythmics, he will almost certainly need a pacemaker. 5. HLP: Statin, most recent lipid profile at target, LDL <70. Encouraged weight loss to boost low HDL. 6. HTN: His blood pressure has really not been low enough to cause dizziness.  I am not sure how to explain that complaint.  It did get better with sodium loading, but this has also led to volume overload and heart failure exacerbation.  May have to reduce the dose of carvedilol and/or lisinopril but I am reluctant to do this until we document that he has normal LVEF. 7. Warfarin anticoagulation: Well-tolerated without bleeding complications.      Medication Adjustments/Labs and Tests Ordered: Current medicines are reviewed at length with the patient today.  Concerns regarding medicines are outlined above.  Medication changes, Labs and Tests ordered today are listed in the Patient Instructions below. Patient Instructions  Medication Instructions:  Dr Sallyanne Kuster has recommended making the following medication changes: 1. INCREASE Furosemide to 20 mg daily until your  weight comes down 5 pounds from today's weight then resume usual dose  If you need a refill on your cardiac medications before your next appointment, please call your pharmacy.   Lab work: Your physician recommends that you return for lab work TODAY.  If you have labs (blood work) drawn today and your tests are completely normal, you will receive your results only by: Marland Kitchen MyChart Message (if you have MyChart) OR . A paper copy in the mail If you have any lab test that is abnormal or we need to change your treatment, we will call you to review the results.  Testing/Procedures: 1. Your physician has requested that you have a lexiscan myoview. For further information please visit [email or web address]. Please follow instruction sheet, as given.  2. Your physician has requested that you have an echocardiogram. Echocardiography is a painless test that uses sound waves to create images of your heart. It provides your doctor with information about the size and shape of your heart and how well your heart's chambers and valves are working. This procedure takes approximately one hour. There are no restrictions for this procedure.  >> These tests will be performed at [facility]: Dr Sallyanne Kuster recommends that you schedule a follow-up appointment first available with a NP/PA. Signed, Sanda Klein, MD  03/18/2018 7:07 PM    [address] [facility], [place], [place]  [address] Phone: [phone]; Fax: [phone]

## 2018-03-18 NOTE — Patient Instructions (Addendum)
Medication Instructions:  Dr Sallyanne Kuster has recommended making the following medication changes: 1. INCREASE Furosemide to 20 mg daily until your weight comes down 5 pounds from today's weight then resume usual dose  If you need a refill on your cardiac medications before your next appointment, please call your pharmacy.   Lab work: Your physician recommends that you return for lab work TODAY.  If you have labs (blood work) drawn today and your tests are completely normal, you will receive your results only by: Marland Kitchen MyChart Message (if you have MyChart) OR . A paper copy in the mail If you have any lab test that is abnormal or we need to change your treatment, we will call you to review the results.  Testing/Procedures: 1. Your physician has requested that you have a lexiscan myoview. For further information please visit [email or web address]. Please follow instruction sheet, as given.  2. Your physician has requested that you have an echocardiogram. Echocardiography is a painless test that uses sound waves to create images of your heart. It provides your doctor with information about the size and shape of your heart and how well your heart's chambers and valves are working. This procedure takes approximately one hour. There are no restrictions for this procedure.  >> These tests will be performed at [place] [facility]: Dr Sallyanne Kuster recommends that you schedule a follow-up appointment first available with a NP/PA.

## 2018-03-19 ENCOUNTER — Other Ambulatory Visit: Payer: Self-pay | Admitting: Cardiovascular Disease

## 2018-03-20 LAB — BASIC METABOLIC PANEL
BUN/Creatinine Ratio: 21 (ref 10–24)
BUN: 23 mg/dL (ref 8–27)
CO2: 24 mmol/L (ref 20–29)
Calcium: 8.8 mg/dL (ref 8.6–10.2)
Chloride: 101 mmol/L (ref 96–106)
Creatinine, Ser: 1.07 mg/dL (ref 0.76–1.27)
GFR calc non Af Amer: 63 mL/min/{1.73_m2} (ref >59)
GFR, EST AFRICAN AMERICAN: 73 mL/min/{1.73_m2} (ref >59)
Glucose: 111 mg/dL — ABNORMAL HIGH (ref 65–99)
Potassium: 4.2 mmol/L (ref 3.5–5.2)
Sodium: 140 mmol/L (ref 134–144)

## 2018-03-20 LAB — PRO B NATRIURETIC PEPTIDE: NT-Pro BNP: 1087 pg/mL — ABNORMAL HIGH (ref 0–486)

## 2018-03-25 ENCOUNTER — Encounter (HOSPITAL_COMMUNITY)
Admission: RE | Admit: 2018-03-25 | Discharge: 2018-03-25 | Disposition: A | Discharge status: Home / Self Care | Payer: Medicare Other | Source: Ambulatory Visit | Attending: Cardiovascular Disease | Admitting: Cardiovascular Disease

## 2018-03-25 ENCOUNTER — Encounter (HOSPITAL_BASED_OUTPATIENT_CLINIC_OR_DEPARTMENT_OTHER)
Admission: RE | Admit: 2018-03-25 | Discharge: 2018-03-25 | Disposition: A | Discharge status: Home / Self Care | Payer: Medicare Other | Source: Ambulatory Visit | Attending: Cardiovascular Disease | Admitting: Cardiovascular Disease

## 2018-03-25 ENCOUNTER — Ambulatory Visit (HOSPITAL_COMMUNITY)
Admission: RE | Admit: 2018-03-25 | Discharge: 2018-03-25 | Disposition: A | Discharge status: Home / Self Care | Payer: Medicare Other | Source: Ambulatory Visit | Attending: Cardiovascular Disease | Admitting: Cardiovascular Disease

## 2018-03-25 ENCOUNTER — Encounter (HOSPITAL_COMMUNITY): Payer: Self-pay

## 2018-03-25 DIAGNOSIS — R0602 Shortness of breath: Secondary | ICD-10-CM | POA: Diagnosis present

## 2018-03-25 LAB — NM MYOCAR MULTI W/SPECT W/WALL MOTION / EF
CHL CUP NUCLEAR SSS: 6
LV dias vol: 111 mL (ref 62–150)
LV sys vol: 57 mL
Peak HR: 100 {beats}/min
RATE: 0.44
Rest HR: 83 {beats}/min
SDS: 1
SRS: 5
TID: 0.9

## 2018-03-25 MED ORDER — REGADENOSON 0.4 MG/5ML IV SOLN
INTRAVENOUS | Status: AC
  Administered 2018-03-25: 0.4 mg via INTRAVENOUS
  Filled 2018-03-25: qty 5

## 2018-03-25 MED ORDER — TECHNETIUM TC 99M TETROFOSMIN IV KIT
10.0000 | PACK | Freq: Once | INTRAVENOUS | Status: AC | PRN
  Administered 2018-03-25: 10.4 via INTRAVENOUS

## 2018-03-25 MED ORDER — TECHNETIUM TC 99M TETROFOSMIN IV KIT
30.0000 | PACK | Freq: Once | INTRAVENOUS | Status: AC | PRN
  Administered 2018-03-25: 31.4 via INTRAVENOUS

## 2018-03-25 MED ORDER — SODIUM CHLORIDE 0.9% FLUSH
INTRAVENOUS | Status: AC
  Administered 2018-03-25: 10 mL via INTRAVENOUS
  Filled 2018-03-25: qty 10

## 2018-03-25 NOTE — Progress Notes (Signed)
*  PRELIMINARY RESULTS* Echocardiogram 2D Echocardiogram has been performed.  Jerry Cain 03/25/2018, 11:09 AM

## 2018-03-29 ENCOUNTER — Ambulatory Visit (INDEPENDENT_AMBULATORY_CARE_PROVIDER_SITE_OTHER): Payer: Medicare Other | Admitting: *Deleted

## 2018-03-29 ENCOUNTER — Encounter (HOSPITAL_COMMUNITY): Payer: Medicare Other

## 2018-03-29 DIAGNOSIS — Z7901 Long term (current) use of anticoagulants: Secondary | ICD-10-CM | POA: Diagnosis not present

## 2018-03-29 DIAGNOSIS — Z5181 Encounter for therapeutic drug level monitoring: Secondary | ICD-10-CM | POA: Diagnosis not present

## 2018-03-29 DIAGNOSIS — I4891 Unspecified atrial fibrillation: Secondary | ICD-10-CM

## 2018-03-29 LAB — POCT INR: INR: 3.1 — AB (ref 2.0–3.0)

## 2018-03-29 NOTE — Patient Instructions (Signed)
Continue coumadin 1 tablet daily except 1/2 tablet on Tuesdays and Saturdays.  Continue greens. Recheck in 4 weeks.

## 2018-04-12 NOTE — Progress Notes (Deleted)
Cardiology Office Note    Date:  04/12/2018   ID:  REEGAN Cain, DOB Feb 14, 1932, MRN [id number]  PCP:  Octavio Graves, DO  Cardiologist: No primary care provider on file.    No chief complaint on file.   History of Present Illness:    Jerry Cain is a 85 y.o. male with past medical history of CAD (s/p PTCA and stenting of mid-LAD and LCx in 2009), PAF (on Coumadin), trifasicular block, chronic diastolic CHF, HTN, and HLD who presents to the office today for 3-week follow-up.  He was last examined by Dr. Sallyanne Kuster in 03/2018 and reported being less active at baseline due to right knee pain and a nonhealing ulcer along his toes. Weight was up 7 lbs to 196 lbs and he reported associated dyspnea with lower extremity edema. He also reported increased sodium intake with consumption of soups. Given his progressive dyspnea, a Lexiscan Myoview was obtained to rule-out an ischemic etiology and showed findings consistent with prior inferior/apical myocardial infarction without current ischemia. EF was read as being reduced to 49% but an echocardiogram was also obtained and showed a preserved EF of 60-65%. Also noted to have mild MR, and moderately dilated LA and RA. Was recommended to increase Lasix to 20mg  daily until weight declined by 5 lbs hen resume at 20mg  every other day. Per review of Dr. Victorino December note " I believe optimal range is around 192 lb. Avoid dropping under 190 lb (this will probably cause dizziness again) or increasing above 197 (will cause shortness of breath)".   Past Medical History:  Diagnosis Date  . Adenomatous colon polyp 2001  . Anxiety   . Atrial fibrillation ([place])    takes Coumadin daily  . BPH (benign prostatic hyperplasia)    takes Proscar daily  . CAD (coronary artery disease) 2009   cardiac cath and PTCA by Dr. Tami Ribas  . Carpal tunnel syndrome    right  . Complication of anesthesia    hard to wake up  . Diverticulosis 2007   tcs by Dr. Laural Golden   . GERD (gastroesophageal reflux disease)   . Gout   . Hypercholesterolemia    takes Atorvastatin daily  . Hypertension    takes Imdur,Coreg,and Lisinopril daily  . Hypothyroidism    takes SYnthroid daily  . Joint pain   . Joint swelling   . Nocturia   . Numbness    right hand pointer and middle finger  . Osteoarthritis    left knee  . Pneumonia    many,many yrs ago  . Tubular adenoma 2001    Past Surgical History:  Procedure Laterality Date  . Barbie Banner OSTEOTOMY Right 03/17/2013   Procedure: Barbie Banner OSTEOTOMY;  Surgeon: Marcheta Grammes, DPM;  Location: [facility];  Service: Orthopedics;  Laterality: Right;  . BUNIONECTOMY Right 03/17/2013   Procedure: BUNIONECTOMY, Arthroplasty 2nd toe right foot;  Surgeon: Marcheta Grammes, DPM;  Location: [facility];  Service: Orthopedics;  Laterality: Right;  . CARDIAC CATHETERIZATION  06/11/2007   PTCA X2 in 06/2007:  2 overlapping Cypher stents to LAD (2.5x13 and 3.0x23)  . CARDIAC CATHETERIZATION  06/21/2007   Cypher stent 2.5 x 13  to OM branch  . CARDIOVERSION  12/12/2011   Procedure: CARDIOVERSION;  Surgeon: Sanda Klein, MD;  Location: [facility];  Service: Cardiovascular;  Laterality: N/A;  . CATARACT EXTRACTION     bilateral  . COLONOSCOPY  2007   Dr. Laural Golden- L sided diverticulosis. Next TCS 2012 due to h/o  tubular adenoma.  . COLONOSCOPY  02/26/2011   Procedure: COLONOSCOPY;  Surgeon: Daneil Dolin, MD;  Location: [facility];  Service: Endoscopy;  Laterality: N/A;  11:05  . CORONARY ANGIOPLASTY     x 3   . FLEXOR TENOTOMY Left 03/17/2013   Procedure: PERCUTANEOUS FLEXOR TENOTOMY 3RD TOE LEFT FOOT;  Surgeon: Marcheta Grammes, DPM;  Location: [facility];  Service: Orthopedics;  Laterality: Left;  . HERNIA REPAIR  2009   left inguinal  . rot Left   . TEE WITHOUT CARDIOVERSION  12/12/2011   Procedure: TRANSESOPHAGEAL ECHOCARDIOGRAM (TEE);  Surgeon: Sanda Klein, MD;  Location: [facility];  Service: Cardiovascular;   Laterality: N/A;   successful/sinus rhythm  . TOTAL KNEE ARTHROPLASTY Left 06/14/2013   Procedure: TOTAL KNEE ARTHROPLASTY;  Surgeon: Garald Balding, MD;  Location: [place];  Service: Orthopedics;  Laterality: Left;    Current Medications: Outpatient Medications Prior to Visit  Medication Sig Dispense Refill  . Calcium Carbonate-Vitamin D (CALCIUM + D) 600-200 MG-UNIT TABS Take 1 tablet by mouth 2 (two) times daily.     . carvedilol (COREG) 25 MG tablet TAKE (1) TABLET BY MOUTH TWICE DAILY WITH A MEAL. 180 tablet 2  . finasteride (PROSCAR) 5 MG tablet Take 5 mg by mouth every morning.     . furosemide (LASIX) 20 MG tablet TAKE (1) TABLET BY MOUTH ONCE DAILY. 90 tablet 4  . Glucosamine 750 MG TABS Take 750 mg by mouth 2 (two) times daily.    . isosorbide mononitrate (IMDUR) 60 MG 24 hr tablet TAKE ONE TABLET ONCE DAILY IN THE MORNING. 90 tablet 3  . levothyroxine (SYNTHROID, LEVOTHROID) 125 MCG tablet Take 125 mcg by mouth daily before breakfast.     . lisinopril (PRINIVIL,ZESTRIL) 20 MG tablet Take 1 tablet (20 mg total) by mouth daily. KEEP OV. 30 tablet 0  . lisinopril (PRINIVIL,ZESTRIL) 20 MG tablet TAKE ONE TABLET BY MOUTH AT BEDTIME (8PM). 30 tablet 10  . Multiple Vitamin (MULTIVITAMIN) capsule Take 1 capsule by mouth every morning.     . nitroGLYCERIN (NITROSTAT) 0.4 MG SL tablet DISSOLVE 1 TABLET UNDER TONGUE EVERY 5 MINUTES UP TO 15 MIN FOR CHESTPAIN. IF NO RELIEF CALL 911. 25 tablet 6  . rosuvastatin (CRESTOR) 10 MG tablet TAKE (1) TABLET BY MOUTH ONCE A WEEK. 4 tablet 11  . traMADol (ULTRAM) 50 MG tablet Take 50 mg by mouth every 6 (six) hours as needed.     . warfarin (COUMADIN) 5 MG tablet TAKE 1 TABLET BY MOUTH DAILY OR AS DIRECTED BY COUMADIN CLINIC. 90 tablet 0   No facility-administered medications prior to visit.      Allergies:   Carbapenems; Cephalosporins; and Penicillins   Social History   Socioeconomic History  . Marital status: Married    Spouse name: Not on  file  . Number of children: 4  . Years of education: Not on file  . Highest education level: Not on file  Occupational History  . Occupation: retired  Scientific laboratory technician  . Financial resource strain: Not on file  . Food insecurity:    Worry: Not on file    Inability: Not on file  . Transportation needs:    Medical: Not on file    Non-medical: Not on file  Tobacco Use  . Smoking status: Never Smoker  . Smokeless tobacco: Never Used  Substance and Sexual Activity  . Alcohol use: No  . Drug use: No  . Sexual activity: Not on file  Lifestyle  . Physical activity:    Days per week: Not on file    Minutes per session: Not on file  . Stress: Not on file  Relationships  . Social connections:    Talks on phone: Not on file    Gets together: Not on file    Attends religious service: Not on file    Active member of club or organization: Not on file    Attends meetings of clubs or organizations: Not on file    Relationship status: Not on file  Other Topics Concern  . Not on file  Social History Narrative  . Not on file     Family History:  The patient's ***family history includes Heart disease in his brother; Pneumonia in his father.   Review of Systems:   Please see the history of present illness.     General:  No chills, fever, night sweats or weight changes.  Cardiovascular:  No chest pain, dyspnea on exertion, edema, orthopnea, palpitations, paroxysmal nocturnal dyspnea. Dermatological: No rash, lesions/masses Respiratory: No cough, dyspnea Urologic: No hematuria, dysuria Abdominal:   No nausea, vomiting, diarrhea, bright red blood per rectum, melena, or hematemesis Neurologic:  No visual changes, wkns, changes in mental status. All other systems reviewed and are otherwise negative except as noted above.   Physical Exam:    VS:  There were no vitals taken for this visit.   General: Well developed, well nourished,male appearing in no acute distress. Head: Normocephalic,  atraumatic, sclera non-icteric, no xanthomas, nares are without discharge.  Neck: No carotid bruits. JVD not elevated.  Lungs: Respirations regular and unlabored, without wheezes or rales.  Heart: ***Regular rate and rhythm. No S3 or S4.  No murmur, no rubs, or gallops appreciated. Abdomen: Soft, non-tender, non-distended with normoactive bowel sounds. No hepatomegaly. No rebound/guarding. No obvious abdominal masses. Msk:  Strength and tone appear normal for age. No joint deformities or effusions. Extremities: No clubbing or cyanosis. No edema.  Distal pedal pulses are 2+ bilaterally. Neuro: Alert and oriented X 3. Moves all extremities spontaneously. No focal deficits noted. Psych:  Responds to questions appropriately with a normal affect. Skin: No rashes or lesions noted  Wt Readings from Last 3 Encounters:  03/18/18 210 lb (95.3 kg)  08/25/17 203 lb (92.1 kg)  08/14/16 192 lb 9.6 oz (87.4 kg)        Studies/Labs Reviewed:   EKG:  EKG is*** ordered today.  The ekg ordered today demonstrates ***  Recent Labs: 08/27/2017: ALT 22; Hemoglobin 13.1; Platelets 183 03/19/2018: BUN 23; Creatinine, Ser 1.07; NT-Pro BNP 1,087; Potassium 4.2; Sodium 140   Lipid Panel    Component Value Date/Time   CHOL 117 08/27/2017 0820   TRIG 83 08/27/2017 0820   HDL 34 (L) 08/27/2017 0820   CHOLHDL 3.4 08/27/2017 0820   CHOLHDL 3.2 06/12/2016 0929   VLDL 16 06/12/2016 0929   LDLCALC 66 08/27/2017 0820    Additional studies/ records that were reviewed today include:   NST: 03/2018  There was no ST segment deviation noted during stress.  Findings consistent with prior inferior/apical myocardial infarction without current ischemia  This is an intermediate risk study. Risk based on decreased LVEF, no current myocardium at jeopardy. Correlate LVEF with echo.  The left ventricular ejection fraction is mildly decreased (49%).  Echocardiogram: 03/25/2018 Study Conclusions  - Left ventricle:  The cavity size was normal. Wall thickness was   increased in a pattern of moderate LVH. Systolic function was  normal. The estimated ejection fraction was in the range of 60%   to 65%. Wall motion was normal; there were no regional wall   motion abnormalities. The study is not technically sufficient to   allow evaluation of LV diastolic function. - Aortic valve: Moderately calcified annulus. Trileaflet;   moderately thickened leaflets. Valve area (VTI): 1.79 cm^2. Valve   area (Vmax): 1.79 cm^2. Valve area (Vmean): 1.76 cm^2. - Mitral valve: Mildly calcified annulus. Mildly thickened leaflets   . There was mild regurgitation. - Left atrium: The atrium was moderately dilated. - Right atrium: The atrium was moderately dilated.  Assessment:    No diagnosis found.   Plan:   In order of problems listed above:  1. ***    Medication Adjustments/Labs and Tests Ordered: Current medicines are reviewed at length with the patient today.  Concerns regarding medicines are outlined above.  Medication changes, Labs and Tests ordered today are listed in the Patient Instructions below. There are no Patient Instructions on file for this visit.   Signed, Erma Heritage, PA-C  04/12/2018 4:06 PM    [facility] [address] [place], [place] [address] Phone: [phone]

## 2018-04-13 ENCOUNTER — Ambulatory Visit: Payer: Medicare Other | Admitting: Student

## 2018-04-23 ENCOUNTER — Ambulatory Visit: Payer: Medicare Other | Admitting: Urology

## 2018-04-23 DIAGNOSIS — N401 Enlarged prostate with lower urinary tract symptoms: Secondary | ICD-10-CM

## 2018-04-23 DIAGNOSIS — R351 Nocturia: Secondary | ICD-10-CM | POA: Diagnosis not present

## 2018-04-23 DIAGNOSIS — R972 Elevated prostate specific antigen [PSA]: Secondary | ICD-10-CM

## 2018-04-30 ENCOUNTER — Inpatient Hospital Stay (HOSPITAL_COMMUNITY)
Admission: EM | Admit: 2018-04-30 | Discharge: 2018-05-03 | DRG: 291 | Disposition: A | Discharge status: Home / Self Care | Payer: Medicare Other | Attending: Family Medicine | Admitting: Family Medicine

## 2018-04-30 ENCOUNTER — Encounter (HOSPITAL_COMMUNITY): Payer: Self-pay

## 2018-04-30 ENCOUNTER — Emergency Department (HOSPITAL_COMMUNITY): Payer: Medicare Other

## 2018-04-30 ENCOUNTER — Other Ambulatory Visit: Payer: Self-pay

## 2018-04-30 DIAGNOSIS — Z9842 Cataract extraction status, left eye: Secondary | ICD-10-CM

## 2018-04-30 DIAGNOSIS — J9601 Acute respiratory failure with hypoxia: Secondary | ICD-10-CM

## 2018-04-30 DIAGNOSIS — Z8249 Family history of ischemic heart disease and other diseases of the circulatory system: Secondary | ICD-10-CM

## 2018-04-30 DIAGNOSIS — R0902 Hypoxemia: Secondary | ICD-10-CM

## 2018-04-30 DIAGNOSIS — I509 Heart failure, unspecified: Secondary | ICD-10-CM

## 2018-04-30 DIAGNOSIS — Z888 Allergy status to other drugs, medicaments and biological substances status: Secondary | ICD-10-CM

## 2018-04-30 DIAGNOSIS — Z79891 Long term (current) use of opiate analgesic: Secondary | ICD-10-CM

## 2018-04-30 DIAGNOSIS — I110 Hypertensive heart disease with heart failure: Principal | ICD-10-CM | POA: Diagnosis present

## 2018-04-30 DIAGNOSIS — I4821 Permanent atrial fibrillation: Secondary | ICD-10-CM | POA: Diagnosis present

## 2018-04-30 DIAGNOSIS — Z955 Presence of coronary angioplasty implant and graft: Secondary | ICD-10-CM

## 2018-04-30 DIAGNOSIS — Z96652 Presence of left artificial knee joint: Secondary | ICD-10-CM | POA: Diagnosis present

## 2018-04-30 DIAGNOSIS — N179 Acute kidney failure, unspecified: Secondary | ICD-10-CM

## 2018-04-30 DIAGNOSIS — K219 Gastro-esophageal reflux disease without esophagitis: Secondary | ICD-10-CM | POA: Diagnosis present

## 2018-04-30 DIAGNOSIS — Z7982 Long term (current) use of aspirin: Secondary | ICD-10-CM

## 2018-04-30 DIAGNOSIS — Z79890 Hormone replacement therapy (postmenopausal): Secondary | ICD-10-CM

## 2018-04-30 DIAGNOSIS — I2510 Atherosclerotic heart disease of native coronary artery without angina pectoris: Secondary | ICD-10-CM

## 2018-04-30 DIAGNOSIS — N400 Enlarged prostate without lower urinary tract symptoms: Secondary | ICD-10-CM | POA: Diagnosis present

## 2018-04-30 DIAGNOSIS — Z881 Allergy status to other antibiotic agents status: Secondary | ICD-10-CM

## 2018-04-30 DIAGNOSIS — J45909 Unspecified asthma, uncomplicated: Secondary | ICD-10-CM

## 2018-04-30 DIAGNOSIS — Z880 Allergy status to penicillin: Secondary | ICD-10-CM

## 2018-04-30 DIAGNOSIS — R0602 Shortness of breath: Secondary | ICD-10-CM

## 2018-04-30 DIAGNOSIS — Z86010 Personal history of colonic polyps: Secondary | ICD-10-CM

## 2018-04-30 DIAGNOSIS — J45901 Unspecified asthma with (acute) exacerbation: Secondary | ICD-10-CM | POA: Diagnosis present

## 2018-04-30 DIAGNOSIS — Z9841 Cataract extraction status, right eye: Secondary | ICD-10-CM

## 2018-04-30 DIAGNOSIS — I5033 Acute on chronic diastolic (congestive) heart failure: Secondary | ICD-10-CM

## 2018-04-30 DIAGNOSIS — E7800 Pure hypercholesterolemia, unspecified: Secondary | ICD-10-CM | POA: Diagnosis present

## 2018-04-30 DIAGNOSIS — Z7901 Long term (current) use of anticoagulants: Secondary | ICD-10-CM

## 2018-04-30 DIAGNOSIS — F419 Anxiety disorder, unspecified: Secondary | ICD-10-CM | POA: Diagnosis present

## 2018-04-30 DIAGNOSIS — G8929 Other chronic pain: Secondary | ICD-10-CM | POA: Diagnosis present

## 2018-04-30 DIAGNOSIS — E039 Hypothyroidism, unspecified: Secondary | ICD-10-CM | POA: Diagnosis present

## 2018-04-30 DIAGNOSIS — I4891 Unspecified atrial fibrillation: Secondary | ICD-10-CM | POA: Diagnosis present

## 2018-04-30 DIAGNOSIS — I10 Essential (primary) hypertension: Secondary | ICD-10-CM | POA: Diagnosis present

## 2018-04-30 DIAGNOSIS — E785 Hyperlipidemia, unspecified: Secondary | ICD-10-CM | POA: Diagnosis present

## 2018-04-30 DIAGNOSIS — Z79899 Other long term (current) drug therapy: Secondary | ICD-10-CM

## 2018-04-30 DIAGNOSIS — Z7984 Long term (current) use of oral hypoglycemic drugs: Secondary | ICD-10-CM

## 2018-04-30 LAB — CBC WITH DIFFERENTIAL/PLATELET
Abs Immature Granulocytes: 0.02 10*3/uL (ref 0.00–0.07)
Basophils Absolute: 0 10*3/uL (ref 0.0–0.1)
Basophils Relative: 0 %
EOS ABS: 0.3 10*3/uL (ref 0.0–0.5)
Eosinophils Relative: 4 %
HEMATOCRIT: 37.8 % — AB (ref 39.0–52.0)
Hemoglobin: 11.8 g/dL — ABNORMAL LOW (ref 13.0–17.0)
Immature Granulocytes: 0 %
Lymphocytes Relative: 15 %
Lymphs Abs: 1.1 10*3/uL (ref 0.7–4.0)
MCH: 29.9 pg (ref 26.0–34.0)
MCHC: 31.2 g/dL (ref 30.0–36.0)
MCV: 95.7 fL (ref 80.0–100.0)
Monocytes Absolute: 0.7 10*3/uL (ref 0.1–1.0)
Monocytes Relative: 10 %
NEUTROS PCT: 71 %
Neutro Abs: 5.1 10*3/uL (ref 1.7–7.7)
PLATELETS: 181 10*3/uL (ref 150–400)
RBC: 3.95 MIL/uL — ABNORMAL LOW (ref 4.22–5.81)
RDW: 14.5 % (ref 11.5–15.5)
WBC: 7.2 10*3/uL (ref 4.0–10.5)
nRBC: 0 % (ref 0.0–0.2)

## 2018-04-30 LAB — BASIC METABOLIC PANEL
Anion gap: 9 (ref 5–15)
BUN: 26 mg/dL — ABNORMAL HIGH (ref 8–23)
CO2: 26 mmol/L (ref 22–32)
Calcium: 8.9 mg/dL (ref 8.9–10.3)
Chloride: 101 mmol/L (ref 98–111)
Creatinine, Ser: 1.33 mg/dL — ABNORMAL HIGH (ref 0.61–1.24)
GFR calc Af Amer: 56 mL/min — ABNORMAL LOW (ref >60)
GFR, EST NON AFRICAN AMERICAN: 49 mL/min — AB (ref >60)
GLUCOSE: 103 mg/dL — AB (ref 70–99)
Potassium: 4.3 mmol/L (ref 3.5–5.1)
Sodium: 136 mmol/L (ref 135–145)

## 2018-04-30 LAB — TROPONIN I: Troponin I: <0.03 ng/mL (ref <0.03)

## 2018-04-30 LAB — PROTIME-INR
INR: 2.66
Prothrombin Time: 28 seconds — ABNORMAL HIGH (ref 11.4–15.2)

## 2018-04-30 LAB — URINALYSIS, ROUTINE W REFLEX MICROSCOPIC
BACTERIA UA: NONE SEEN
Bilirubin Urine: NEGATIVE
Glucose, UA: NEGATIVE mg/dL
Ketones, ur: NEGATIVE mg/dL
Leukocytes, UA: NEGATIVE
Nitrite: NEGATIVE
PH: 7 (ref 5.0–8.0)
Protein, ur: NEGATIVE mg/dL
Specific Gravity, Urine: 1.006 (ref 1.005–1.030)

## 2018-04-30 LAB — BRAIN NATRIURETIC PEPTIDE: B Natriuretic Peptide: 345 pg/mL — ABNORMAL HIGH (ref 0.0–100.0)

## 2018-04-30 MED ORDER — ASPIRIN EC 81 MG PO TBEC
81.0000 mg | DELAYED_RELEASE_TABLET | Freq: Every morning | ORAL | Status: DC
  Administered 2018-05-01 – 2018-05-03 (×3): 81 mg via ORAL
  Filled 2018-04-30 (×3): qty 1

## 2018-04-30 MED ORDER — CALCIUM CARBONATE-VITAMIN D 500-200 MG-UNIT PO TABS
1.0000 | ORAL_TABLET | Freq: Two times a day (BID) | ORAL | Status: DC
  Administered 2018-05-01 – 2018-05-03 (×5): 1 via ORAL
  Filled 2018-04-30 (×11): qty 1

## 2018-04-30 MED ORDER — PREDNISONE 20 MG PO TABS
40.0000 mg | ORAL_TABLET | Freq: Every day | ORAL | Status: DC
  Administered 2018-05-01: 40 mg via ORAL
  Filled 2018-04-30: qty 2

## 2018-04-30 MED ORDER — FUROSEMIDE 10 MG/ML IJ SOLN
20.0000 mg | Freq: Once | INTRAMUSCULAR | Status: AC
  Administered 2018-04-30: 20 mg via INTRAVENOUS
  Filled 2018-04-30: qty 2

## 2018-04-30 MED ORDER — IPRATROPIUM-ALBUTEROL 0.5-2.5 (3) MG/3ML IN SOLN
RESPIRATORY_TRACT | Status: AC
  Filled 2018-04-30: qty 3

## 2018-04-30 MED ORDER — IPRATROPIUM-ALBUTEROL 0.5-2.5 (3) MG/3ML IN SOLN
3.0000 mL | Freq: Four times a day (QID) | RESPIRATORY_TRACT | Status: DC
  Administered 2018-04-30 – 2018-05-01 (×5): 3 mL via RESPIRATORY_TRACT
  Filled 2018-04-30 (×4): qty 3

## 2018-04-30 MED ORDER — ISOSORBIDE MONONITRATE ER 60 MG PO TB24
60.0000 mg | ORAL_TABLET | Freq: Every morning | ORAL | Status: DC
  Administered 2018-05-01 – 2018-05-03 (×3): 60 mg via ORAL
  Filled 2018-04-30 (×6): qty 1

## 2018-04-30 MED ORDER — FUROSEMIDE 10 MG/ML IJ SOLN: 40.0000 mg | Freq: Once | INTRAMUSCULAR | Status: DC

## 2018-04-30 MED ORDER — TRAMADOL HCL 50 MG PO TABS: 50.0000 mg | ORAL_TABLET | Freq: Four times a day (QID) | ORAL | Status: DC | PRN

## 2018-04-30 MED ORDER — ACETAMINOPHEN 650 MG RE SUPP: 650.0000 mg | Freq: Four times a day (QID) | RECTAL | Status: DC | PRN

## 2018-04-30 MED ORDER — CARVEDILOL 12.5 MG PO TABS
25.0000 mg | ORAL_TABLET | Freq: Two times a day (BID) | ORAL | Status: DC
  Administered 2018-04-30 – 2018-05-03 (×6): 25 mg via ORAL
  Filled 2018-04-30 (×6): qty 2

## 2018-04-30 MED ORDER — ADULT MULTIVITAMIN W/MINERALS CH
1.0000 | ORAL_TABLET | Freq: Every morning | ORAL | Status: DC
  Administered 2018-05-01 – 2018-05-03 (×3): 1 via ORAL
  Filled 2018-04-30 (×3): qty 1

## 2018-04-30 MED ORDER — ROSUVASTATIN CALCIUM 10 MG PO TABS
10.0000 mg | ORAL_TABLET | ORAL | Status: DC
  Administered 2018-05-02: 10 mg via ORAL
  Filled 2018-04-30 (×2): qty 1

## 2018-04-30 MED ORDER — FINASTERIDE 5 MG PO TABS
5.0000 mg | ORAL_TABLET | Freq: Every morning | ORAL | Status: DC
  Administered 2018-05-01 – 2018-05-03 (×3): 5 mg via ORAL
  Filled 2018-04-30 (×6): qty 1

## 2018-04-30 MED ORDER — ACETAMINOPHEN 325 MG PO TABS: 650.0000 mg | ORAL_TABLET | Freq: Four times a day (QID) | ORAL | Status: DC | PRN

## 2018-04-30 MED ORDER — GLUCOSAMINE 750 MG PO TABS: 750.0000 mg | ORAL_TABLET | Freq: Two times a day (BID) | ORAL | Status: DC

## 2018-04-30 MED ORDER — METHYLPREDNISOLONE SODIUM SUCC 125 MG IJ SOLR
125.0000 mg | Freq: Once | INTRAMUSCULAR | Status: AC
  Administered 2018-04-30: 125 mg via INTRAVENOUS
  Filled 2018-04-30: qty 2

## 2018-04-30 MED ORDER — LEVOTHYROXINE SODIUM 75 MCG PO TABS
150.0000 ug | ORAL_TABLET | Freq: Every day | ORAL | Status: DC
  Administered 2018-05-01 – 2018-05-03 (×3): 150 ug via ORAL
  Filled 2018-04-30 (×3): qty 2

## 2018-04-30 MED ORDER — FUROSEMIDE 10 MG/ML IJ SOLN
40.0000 mg | Freq: Two times a day (BID) | INTRAMUSCULAR | Status: DC
  Administered 2018-05-01 – 2018-05-03 (×5): 40 mg via INTRAVENOUS
  Filled 2018-04-30 (×5): qty 4

## 2018-04-30 NOTE — ED Notes (Signed)
Emptied 400 ml urine from urinal.

## 2018-04-30 NOTE — H&P (Addendum)
History and Physical    Jerry Cain PZW:[id number] DOB: 12-Jun-1931 DOA: 04/30/2018  PCP: Octavio Graves, DO Patient coming from: Home  Chief Complaint: Shortness of breath, cough  HPI: Jerry Cain is a 85 y.o. male with medical history significant of chronic diastolic congestive heart failure, hypertension, hypothyroidism, hyperlipidemia, GERD, CAD status post PCI, BPH, atrial fibrillation, anxiety presenting to the hospital for evaluation of shortness of breath and cough.  Patient reports coughing for the past 1 to 2 months.  Reports having dyspnea and wheezing for the past 3 days.  Reports having paroxysmal nocturnal dyspnea and chronic lower extremity edema.  States he has been told by his doctor to eat a lot of salt to help with his dizziness and blood pressure in the past.  He drinks 2 quarts of water in addition to other fluids every day.  States he has been taking Lasix 20 mg daily.  Denies having any chest pain, fevers, or chills.  States he is a never smoker.  Review of Systems: As per HPI otherwise 10 point review of systems negative.  Past Medical History:  Diagnosis Date  . Adenomatous colon polyp 2001  . Anxiety   . Atrial fibrillation ([place])    takes Coumadin daily  . BPH (benign prostatic hyperplasia)    takes Proscar daily  . CAD (coronary artery disease) 2009   cardiac cath and PTCA by Dr. Tami Ribas  . Carpal tunnel syndrome    right  . Complication of anesthesia    hard to wake up  . Diverticulosis 2007   tcs by Dr. Laural Golden  . GERD (gastroesophageal reflux disease)   . Gout   . Hypercholesterolemia    takes Atorvastatin daily  . Hypertension    takes Imdur,Coreg,and Lisinopril daily  . Hypothyroidism    takes SYnthroid daily  . Joint pain   . Joint swelling   . Nocturia   . Numbness    right hand pointer and middle finger  . Osteoarthritis    left knee  . Pneumonia    many,many yrs ago  . Tubular adenoma 2001    Past Surgical History:    Procedure Laterality Date  . Barbie Banner OSTEOTOMY Right 03/17/2013   Procedure: Barbie Banner OSTEOTOMY;  Surgeon: Marcheta Grammes, DPM;  Location: [facility];  Service: Orthopedics;  Laterality: Right;  . BUNIONECTOMY Right 03/17/2013   Procedure: BUNIONECTOMY, Arthroplasty 2nd toe right foot;  Surgeon: Marcheta Grammes, DPM;  Location: [facility];  Service: Orthopedics;  Laterality: Right;  . CARDIAC CATHETERIZATION  06/11/2007   PTCA X2 in 06/2007:  2 overlapping Cypher stents to LAD (2.5x13 and 3.0x23)  . CARDIAC CATHETERIZATION  06/21/2007   Cypher stent 2.5 x 13  to OM branch  . CARDIOVERSION  12/12/2011   Procedure: CARDIOVERSION;  Surgeon: Sanda Klein, MD;  Location: [facility];  Service: Cardiovascular;  Laterality: N/A;  . CATARACT EXTRACTION     bilateral  . COLONOSCOPY  2007   Dr. Laural Golden- L sided diverticulosis. Next TCS 2012 due to h/o tubular adenoma.  . COLONOSCOPY  02/26/2011   Procedure: COLONOSCOPY;  Surgeon: Daneil Dolin, MD;  Location: [facility];  Service: Endoscopy;  Laterality: N/A;  11:05  . CORONARY ANGIOPLASTY     x 3   . FLEXOR TENOTOMY Left 03/17/2013   Procedure: PERCUTANEOUS FLEXOR TENOTOMY 3RD TOE LEFT FOOT;  Surgeon: Marcheta Grammes, DPM;  Location: [facility];  Service: Orthopedics;  Laterality: Left;  . HERNIA REPAIR  2009  left inguinal  . rot Left   . TEE WITHOUT CARDIOVERSION  12/12/2011   Procedure: TRANSESOPHAGEAL ECHOCARDIOGRAM (TEE);  Surgeon: Sanda Klein, MD;  Location: [facility];  Service: Cardiovascular;  Laterality: N/A;   successful/sinus rhythm  . TOTAL KNEE ARTHROPLASTY Left 06/14/2013   Procedure: TOTAL KNEE ARTHROPLASTY;  Surgeon: Garald Balding, MD;  Location: [facility];  Service: Orthopedics;  Laterality: Left;     reports that he has never smoked. He has never used smokeless tobacco. He reports that he does not drink alcohol or use drugs.  Allergies  Allergen Reactions  . Carbapenems Other (See Comments)    Altered mental  status  . Cephalosporins     Unknown reaction  . Penicillins Swelling and Other (See Comments)    Did it involve swelling of the face/tongue/throat, SOB, or low BP? Unknown-possible swelling Did it involve sudden or severe rash/hives, skin peeling, or any reaction on the inside of your mouth or nose? Unknown Did you need to seek medical attention at a hospital or doctor's office? Unknown When did it last happen?Over 10 years (possible swelling) If all above answers are "NO", may proceed with cephalosporin use.     Family History  Problem Relation Age of Onset  . Pneumonia Father   . Heart disease Brother        open heart surgery at age 58  . Colon cancer Neg Hx     Prior to Admission medications   Medication Sig Start Date End Date Taking? Authorizing Provider  aspirin EC 81 MG tablet Take 81 mg by mouth every morning.   Yes [provider]  Calcium Carbonate-Vitamin D (CALCIUM + D) 600-200 MG-UNIT TABS Take 1 tablet by mouth 2 (two) times daily.    Yes [provider]  carvedilol (COREG) 25 MG tablet TAKE (1) TABLET BY MOUTH TWICE DAILY WITH A MEAL. Patient taking differently: Take 25 mg by mouth 2 (two) times daily with a meal.  11/17/17  Yes Croitoru, Mihai, MD  finasteride (PROSCAR) 5 MG tablet Take 5 mg by mouth every morning.  01/28/11  Yes [provider]  furosemide (LASIX) 20 MG tablet TAKE (1) TABLET BY MOUTH ONCE DAILY. Patient taking differently: Take 20 mg by mouth every morning.  03/19/18  Yes Croitoru, Mihai, MD  Glucosamine 750 MG TABS Take 750 mg by mouth 2 (two) times daily.   Yes [provider]  isosorbide mononitrate (IMDUR) 60 MG 24 hr tablet TAKE ONE TABLET ONCE DAILY IN THE MORNING. Patient taking differently: Take 60 mg by mouth every morning.  09/21/17  Yes Croitoru, Mihai, MD  levothyroxine (SYNTHROID, LEVOTHROID) 150 MCG tablet Take 150 mcg by mouth every morning. 04/08/18  Yes [provider]  lisinopril  (PRINIVIL,ZESTRIL) 20 MG tablet TAKE ONE TABLET BY MOUTH AT BEDTIME (8PM). Patient taking differently: Take 20 mg by mouth at bedtime.  09/08/17  Yes Croitoru, Mihai, MD  Multiple Vitamin (MULTIVITAMIN) capsule Take 1 capsule by mouth every morning.    Yes [provider]  nitroGLYCERIN (NITROSTAT) 0.4 MG SL tablet DISSOLVE 1 TABLET UNDER TONGUE EVERY 5 MINUTES UP TO 15 MIN FOR CHESTPAIN. IF NO RELIEF CALL 911. Patient taking differently: Place 0.4 mg under the tongue every 5 (five) minutes as needed for chest pain.  03/08/18  Yes Lorretta Harp, MD  rosuvastatin (CRESTOR) 10 MG tablet TAKE (1) TABLET BY MOUTH ONCE A WEEK. Patient taking differently: Take 10 mg by mouth every Sunday.  11/09/17  Yes Croitoru,  Mihai, MD  traMADol (ULTRAM) 50 MG tablet Take 50 mg by mouth every 6 (six) hours as needed for moderate pain or severe pain.  11/09/14  Yes [provider]  warfarin (COUMADIN) 5 MG tablet TAKE 1 TABLET BY MOUTH DAILY OR AS DIRECTED BY COUMADIN CLINIC. Patient taking differently: Take 2.5-5 mg by mouth See admin instructions. 2.5mg  on Tuesdays and Saturdays. Take 5mg  on all other days 10/07/17  Yes Croitoru, Dani Gobble, MD    Physical Exam: Vitals:   04/30/18 2000 04/30/18 2003 04/30/18 2223 04/30/18 2350  BP: (!) 167/105  (!) 146/80 (!) 148/87  Pulse: (!) 103  88 91  Resp: (!) 24  20 16   Temp:      TempSrc:      SpO2: 97% 98% 97% 93%  Weight:        Physical Exam  Constitutional: He is oriented to person, place, and time. He appears well-developed and well-nourished. No distress.  HENT:  Head: Normocephalic.  Mouth/Throat: Oropharynx is clear and moist.  Eyes: Right eye exhibits no discharge. Left eye exhibits no discharge.  Neck: JVD present.  Cardiovascular: Normal rate, regular rhythm and intact distal pulses.  Pulmonary/Chest: Effort normal. He has wheezes. He has no rales.  On 3 L supplemental oxygen Diffuse end expiratory wheezing  Abdominal: Soft. Bowel sounds  are normal. He exhibits no distension. There is no abdominal tenderness. There is no guarding.  Musculoskeletal:        General: Edema present.     Comments: +3 pitting edema of bilateral lower extremities with chronic venous stasis skin changes  Neurological: He is alert and oriented to person, place, and time.  Skin: Skin is warm and dry. He is not diaphoretic.  Psychiatric: He has a normal mood and affect. His behavior is normal.     Labs on Admission: I have personally reviewed following labs and imaging studies  CBC: Recent Labs  Lab 04/30/18 1557  WBC 7.2  NEUTROABS 5.1  HGB 11.8*  HCT 37.8*  MCV 95.7  PLT 233   Basic Metabolic Panel: Recent Labs  Lab 04/30/18 1557  NA 136  K 4.3  CL 101  CO2 26  GLUCOSE 103*  BUN 26*  CREATININE 1.33*  CALCIUM 8.9   GFR: Estimated Creatinine Clearance: 45.4 mL/min (A) (by C-G formula based on SCr of 1.33 mg/dL (H)). Liver Function Tests: No results for input(s): AST, ALT, ALKPHOS, BILITOT, PROT, ALBUMIN in the last 168 hours. No results for input(s): LIPASE, AMYLASE in the last 168 hours. No results for input(s): AMMONIA in the last 168 hours. Coagulation Profile: Recent Labs  Lab 04/30/18 1557  INR 2.66   Cardiac Enzymes: Recent Labs  Lab 04/30/18 1557  TROPONINI <0.03   BNP (last 3 results) Recent Labs    03/19/18 1423  PROBNP 1,087*   HbA1C: No results for input(s): HGBA1C in the last 72 hours. CBG: No results for input(s): GLUCAP in the last 168 hours. Lipid Profile: No results for input(s): CHOL, HDL, LDLCALC, TRIG, CHOLHDL, LDLDIRECT in the last 72 hours. Thyroid Function Tests: No results for input(s): TSH, T4TOTAL, FREET4, T3FREE, THYROIDAB in the last 72 hours. Anemia Panel: No results for input(s): VITAMINB12, FOLATE, FERRITIN, TIBC, IRON, RETICCTPCT in the last 72 hours. Urine analysis:    Component Value Date/Time   COLORURINE STRAW (A) 04/30/2018 1547   APPEARANCEUR CLEAR 04/30/2018 1547    LABSPEC 1.006 04/30/2018 1547   PHURINE 7.0 04/30/2018 1547   GLUCOSEU NEGATIVE 04/30/2018 1547  HGBUR SMALL (A) 04/30/2018 1547   BILIRUBINUR NEGATIVE 04/30/2018 1547   KETONESUR NEGATIVE 04/30/2018 1547   PROTEINUR NEGATIVE 04/30/2018 1547   UROBILINOGEN 0.2 06/08/2013 1041   NITRITE NEGATIVE 04/30/2018 1547   LEUKOCYTESUR NEGATIVE 04/30/2018 1547    Radiological Exams on Admission: Dg Chest 2 View  Result Date: 04/30/2018 CLINICAL DATA:  Shortness of breath.  Cough and wheezing. EXAM: CHEST - 2 VIEW COMPARISON:  11/23/2014 FINDINGS: The heart is mildly enlarged. There is tortuosity and calcification of the thoracic aorta. Diffuse increased interstitial markings with thickened peripheral interseptal lines (Kerley B lines). Findings are most consistent with interstitial pulmonary edema. No definite pleural effusions or focal infiltrates. IMPRESSION: Findings suggest interstitial pulmonary edema. No pleural effusions and no focal infiltrates. Electronically Signed   By: Marijo Sanes M.D.   On: 04/30/2018 16:42    EKG: Independently reviewed.  Atrial fibrillation (heart rate 97), RBBB, LAFB.  No significant change since prior tracing from December 2019.  Assessment/Plan Principal Problem:   Acute respiratory failure with hypoxia (HCC) Active Problems:   Atrial fibrillation (HCC)   HTN (hypertension)   CAD (coronary artery disease)   Acute exacerbation of CHF (congestive heart failure) (HCC)   Reactive airway disease   AKI (acute kidney injury) ([place])   Acute hypoxic respiratory failure secondary to acute exacerbation of chronic diastolic congestive heart failure and acute exacerbation of reactive airway disease -SPO2 86% on room air with minimal ambulation in the ED. Currently on 3 L supplemental oxygen.  Tachycardia and tachypnea have resolved. -Afebrile and no leukocytosis.   -BNP 345, no prior baseline.  Chest x-ray with interstitial pulmonary edema.  No pleural effusions or  focal infiltrates.  +JVD on exam with bilateral lower extremity +3 pitting edema.  Decompensation of heart failure possibly due to dietary indiscretions including increased fluid and sodium intake. -Echo done in December 2019 showing normal systolic function (EF 60 to 65%), study not technically sufficient to allow evaluation of LV diastolic function.  Prior echo from March 2018 showing grade 2 diastolic dysfunction. -Troponin negative.  EKG not suggestive of ACS. -Cardiac monitoring -Received IV Lasix 40 mg in the ED.  Continue IV Lasix 40 mg twice daily starting tomorrow morning. -Continue to monitor renal function with IV diuresis -Monitor intake and output; daily weights; low-sodium diet with fluid restriction -Supplemental oxygen -Noted to be wheezing on exam.  No documented history of asthma or COPD in the chart and is currently not on any home inhalers.  No prior PFTs in the chart. Will give Solu-Medrol 125 mg once, then continue prednisone 40 mg daily starting tomorrow morning. Duonebs q6 hrs.   Mild AKI BUN 26.  Serum creatinine 1.3, baseline 1.0.  Likely due to vascular congestion from acutely decompensated CHF. -Continue to monitor renal function with IV diuresis -Monitor urine output  Hypertension -Blood pressure elevated.  Continue home Coreg and Imdur.  Chronic anemia Hemoglobin 11.8, was 13.1 eight months ago.  Patient denies having any melena or hematochezia. -Check iron, ferritin, TIBC  Permanent atrial fibrillation CHA2DS2VASc 4.  Currently rate controlled.  On Coumadin for anticoagulation and INR therapeutic at 2.66. -Continue Coumadin for anticoagulation -Continue home Coreg for rate control  CAD status post PCI -Stable.  Continue home aspirin and beta-blocker.  BPH -Continue home finasteride  Hypothyroidism -Continue home Synthroid  Hyperlipidemia -Continue home Crestor  Chronic pain -Continue home tramadol  DVT prophylaxis: Coumadin Code Status: Full  code.  Discussed with the patient. Family Communication: Wife and daughter  at bedside. Disposition Plan: Anticipate discharge after clinical improvement. Consults called: None Admission status: Observation, telemetry   Shela Leff MD [facility] Pager [phone]  If 7PM-7AM, please contact night-coverage [email or web address] Password [facility]  05/01/2018, 12:32 AM

## 2018-04-30 NOTE — ED Notes (Signed)
Patient transferred from ER stretcher to Hospital bed. Patient on full cardiac monitor at this time.

## 2018-04-30 NOTE — ED Notes (Signed)
Patient given Heart Healthy tray at this time. Given Sprite Zero. Reminded patient and family that patient is on fluid restrictions. Patient awaiting room assignment.

## 2018-04-30 NOTE — ED Triage Notes (Signed)
Pt reports that he had cold symptoms on Monday and now he is SOB. Reports cough and he has heard wheezing

## 2018-04-30 NOTE — ED Provider Notes (Signed)
[facility] EMERGENCY DEPARTMENT Provider Note   CSN: [id number] Arrival date & time: 04/30/18  1521     History   Chief Complaint Chief Complaint  Patient presents with  . Shortness of Breath    HPI Jerry Cain is a 85 y.o. male.  HPI  Pt was seen at 1545. Per pt, c/o gradual onset and persistence of constant SOB for the past 2 to 3 weeks, worse over the past 4 days. Has been associated with cough and "wheezing." Pt states he "thinks I have a cold." Denies weight gain, denies increasing pedal edema. Denies CP/palpitations, no fevers, no back pain, no abd pain, no N/V/D.   Past Medical History:  Diagnosis Date  . Adenomatous colon polyp 2001  . Anxiety   . Atrial fibrillation ([place])    takes Coumadin daily  . BPH (benign prostatic hyperplasia)    takes Proscar daily  . CAD (coronary artery disease) 2009   cardiac cath and PTCA by Dr. Tami Ribas  . Carpal tunnel syndrome    right  . Complication of anesthesia    hard to wake up  . Diverticulosis 2007   tcs by Dr. Laural Golden  . GERD (gastroesophageal reflux disease)   . Gout   . Hypercholesterolemia    takes Atorvastatin daily  . Hypertension    takes Imdur,Coreg,and Lisinopril daily  . Hypothyroidism    takes SYnthroid daily  . Joint pain   . Joint swelling   . Nocturia   . Numbness    right hand pointer and middle finger  . Osteoarthritis    left knee  . Pneumonia    many,many yrs ago  . Tubular adenoma 2001    Patient Active Problem List   Diagnosis Date Noted  . Acute on chronic diastolic heart failure ([place]) 08/16/2016  . 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 Arthritis of knee, left 06/16/2013  . S/P total knee replacement using cement 06/14/2013  . Osteoarthritis of left knee 03/21/2013  . BPH (benign prostatic hyperplasia)   . GERD (gastroesophageal reflux disease)   . Coronary artery disease of native artery of native heart with stable angina pectoris ([place])   . Fever 03/20/2013  . Altered mental  state 03/20/2013  . Postoperative fever 03/20/2013  . PNA (pneumonia) 03/20/2013  . Preop cardiovascular exam 11/24/2012  . Peroneal tendonitis 10/05/2012  . Arthritis of foot, degenerative 10/05/2012  . HTN (hypertension) 09/25/2012  . Hypercholesterolemia 09/25/2012  . Atrial fibrillation ([place]) 06/15/2012  . Long term current use of anticoagulant therapy 06/15/2012  . Rotator cuff strain 02/04/2012  . Shoulder pain 02/04/2012  . History of colonic polyps 01/29/2011  . High risk medication use 01/29/2011    Past Surgical History:  Procedure Laterality Date  . Barbie Banner OSTEOTOMY Right 03/17/2013   Procedure: Barbie Banner OSTEOTOMY;  Surgeon: Marcheta Grammes, DPM;  Location: [facility];  Service: Orthopedics;  Laterality: Right;  . BUNIONECTOMY Right 03/17/2013   Procedure: BUNIONECTOMY, Arthroplasty 2nd toe right foot;  Surgeon: Marcheta Grammes, DPM;  Location: [facility];  Service: Orthopedics;  Laterality: Right;  . CARDIAC CATHETERIZATION  06/11/2007   PTCA X2 in 06/2007:  2 overlapping Cypher stents to LAD (2.5x13 and 3.0x23)  . CARDIAC CATHETERIZATION  06/21/2007   Cypher stent 2.5 x 13  to OM branch  . CARDIOVERSION  12/12/2011   Procedure: CARDIOVERSION;  Surgeon: Sanda Klein, MD;  Location: [facility];  Service: Cardiovascular;  Laterality: N/A;  . CATARACT EXTRACTION     bilateral  .  COLONOSCOPY  2007   Dr. Laural Golden- L sided diverticulosis. Next TCS 2012 due to h/o tubular adenoma.  . COLONOSCOPY  02/26/2011   Procedure: COLONOSCOPY;  Surgeon: Daneil Dolin, MD;  Location: [facility];  Service: Endoscopy;  Laterality: N/A;  11:05  . CORONARY ANGIOPLASTY     x 3   . FLEXOR TENOTOMY Left 03/17/2013   Procedure: PERCUTANEOUS FLEXOR TENOTOMY 3RD TOE LEFT FOOT;  Surgeon: Marcheta Grammes, DPM;  Location: [facility];  Service: Orthopedics;  Laterality: Left;  . HERNIA REPAIR  2009   left inguinal  . rot Left   . TEE WITHOUT CARDIOVERSION  12/12/2011   Procedure:  TRANSESOPHAGEAL ECHOCARDIOGRAM (TEE);  Surgeon: Sanda Klein, MD;  Location: [facility];  Service: Cardiovascular;  Laterality: N/A;   successful/sinus rhythm  . TOTAL KNEE ARTHROPLASTY Left 06/14/2013   Procedure: TOTAL KNEE ARTHROPLASTY;  Surgeon: Garald Balding, MD;  Location: [facility];  Service: Orthopedics;  Laterality: Left;        Home Medications    Prior to Admission medications   Medication Sig Start Date End Date Taking? Authorizing Provider  Calcium Carbonate-Vitamin D (CALCIUM + D) 600-200 MG-UNIT TABS Take 1 tablet by mouth 2 (two) times daily.     [provider]  carvedilol (COREG) 25 MG tablet TAKE (1) TABLET BY MOUTH TWICE DAILY WITH A MEAL. 11/17/17   Croitoru, Mihai, MD  finasteride (PROSCAR) 5 MG tablet Take 5 mg by mouth every morning.  01/28/11   [provider]  furosemide (LASIX) 20 MG tablet TAKE (1) TABLET BY MOUTH ONCE DAILY. 03/19/18   Croitoru, Mihai, MD  Glucosamine 750 MG TABS Take 750 mg by mouth 2 (two) times daily.    [provider]  isosorbide mononitrate (IMDUR) 60 MG 24 hr tablet TAKE ONE TABLET ONCE DAILY IN THE MORNING. 09/21/17   Croitoru, Mihai, MD  levothyroxine (SYNTHROID, LEVOTHROID) 125 MCG tablet Take 125 mcg by mouth daily before breakfast.  11/10/14   [provider]  lisinopril (PRINIVIL,ZESTRIL) 20 MG tablet Take 1 tablet (20 mg total) by mouth daily. KEEP OV. 05/18/17   Croitoru, Mihai, MD  lisinopril (PRINIVIL,ZESTRIL) 20 MG tablet TAKE ONE TABLET BY MOUTH AT BEDTIME (8PM). 09/08/17   Croitoru, Mihai, MD  Multiple Vitamin (MULTIVITAMIN) capsule Take 1 capsule by mouth every morning.     [provider]  nitroGLYCERIN (NITROSTAT) 0.4 MG SL tablet DISSOLVE 1 TABLET UNDER TONGUE EVERY 5 MINUTES UP TO 15 MIN FOR CHESTPAIN. IF NO RELIEF CALL 911. 03/08/18   Lorretta Harp, MD  rosuvastatin (CRESTOR) 10 MG tablet TAKE (1) TABLET BY MOUTH ONCE A WEEK. 11/09/17   Croitoru, Mihai, MD  traMADol (ULTRAM) 50 MG  tablet Take 50 mg by mouth every 6 (six) hours as needed.  11/09/14   [provider]  warfarin (COUMADIN) 5 MG tablet TAKE 1 TABLET BY MOUTH DAILY OR AS DIRECTED BY COUMADIN CLINIC. 10/07/17   Croitoru, Dani Gobble, MD    Family History Family History  Problem Relation Age of Onset  . Pneumonia Father   . Heart disease Brother        open heart surgery at age 63  . Colon cancer Neg Hx     Social History Social History   Tobacco Use  . Smoking status: Never Smoker  . Smokeless tobacco: Never Used  Substance Use Topics  . Alcohol use: No  . Drug use: No     Allergies   Carbapenems; Cephalosporins; and Penicillins  Review of Systems Review of Systems ROS: Statement: All systems negative except as marked or noted in the HPI; Constitutional: Negative for fever and chills. ; ; Eyes: Negative for eye pain, redness and discharge. ; ; ENMT: Negative for ear pain, hoarseness, nasal congestion, sinus pressure and sore throat. ; ; Cardiovascular: Negative for chest pain, palpitations, diaphoresis, and peripheral edema. ; ; Respiratory: +cough, wheezing, SOB. Negative for stridor. ; ; Gastrointestinal: Negative for nausea, vomiting, diarrhea, abdominal pain, blood in stool, hematemesis, jaundice and rectal bleeding. . ; ; Genitourinary: Negative for dysuria, flank pain and hematuria. ; ; Musculoskeletal: Negative for back pain and neck pain. Negative for swelling and trauma.; ; Skin: Negative for pruritus, rash, abrasions, blisters, bruising and skin lesion.; ; Neuro: Negative for headache, lightheadedness and neck stiffness. Negative for weakness, altered level of consciousness, altered mental status, extremity weakness, paresthesias, involuntary movement, seizure and syncope.       Physical Exam Updated Vital Signs BP (!) 170/96 (BP Location: Right Arm)   Pulse 89   Temp 98.2 F (36.8 C) (Oral)   Resp (!) 22   Wt 90.7 kg   SpO2 92%   BMI 27.13 kg/m   Physical  Exam 1550: Physical examination:  Nursing notes reviewed; Vital signs and O2 SAT reviewed;  Constitutional: Well developed, Well nourished, Well hydrated, In no acute distress; Head:  Normocephalic, atraumatic; Eyes: EOMI, PERRL, No scleral icterus; ENMT: Mouth and pharynx normal, Mucous membranes moist; Neck: Supple, Full range of motion, No lymphadenopathy; Cardiovascular: Irregular rate and rhythm, No gallop; Respiratory: Breath sounds coarse & equal bilaterally, No wheezes.  Speaking full sentences with ease, Normal respiratory effort/excursion; Chest: Nontender, Movement normal; Abdomen: Soft, Nontender, Nondistended, Normal bowel sounds; Genitourinary: No CVA tenderness; Extremities: Peripheral pulses normal, No tenderness, +1 pedal edema bilat. No calf asymmetry.; Neuro: AA&Ox3, Major CN grossly intact.  Speech clear. No gross focal motor or sensory deficits in extremities. Climbs on and off stretcher easily by himself. Gait steady..; Skin: Color normal, Warm, Dry.   ED Treatments / Results  Labs (all labs ordered are listed, but only abnormal results are displayed)   EKG EKG Interpretation  Date/Time:  Friday April 30 2018 16:04:48 EST Ventricular Rate:  97 PR Interval:    QRS Duration: 173 QT Interval:  365 QTC Calculation: 464 R Axis:   -86 Text Interpretation:  Atrial fibrillation Ventricular premature complex RBBB and LAFB When compared with ECG of 03/18/2018 No significant change was found Confirmed by Francine Graven [phone]) on 04/30/2018 4:16:53 PM   Radiology   Procedures Procedures (including critical care time)  Medications Ordered in ED Medications - No data to display   Initial Impression / Assessment and Plan / ED Course  I have reviewed the triage vital signs and the nursing notes.  Pertinent labs & imaging results that were available during my care of the patient were reviewed by me and considered in my medical decision making (see chart for  details).  MDM Reviewed: previous chart, nursing note and vitals Reviewed previous: labs and ECG Interpretation: labs, ECG and x-ray    Results for orders placed or performed during the hospital encounter of 23/53/61  Basic metabolic panel  Result Value Ref Range   Sodium 136 135 - 145 mmol/L   Potassium 4.3 3.5 - 5.1 mmol/L   Chloride 101 98 - 111 mmol/L   CO2 26 22 - 32 mmol/L   Glucose, Bld 103 (H) 70 - 99 mg/dL   BUN 26 (H) 8 -  23 mg/dL   Creatinine, Ser 1.33 (H) 0.61 - 1.24 mg/dL   Calcium 8.9 8.9 - 10.3 mg/dL   GFR calc non Af Amer 49 (L) >60 mL/min   GFR calc Af Amer 56 (L) >60 mL/min   Anion gap 9 5 - 15  Brain natriuretic peptide  Result Value Ref Range   B Natriuretic Peptide 345.0 (H) 0.0 - 100.0 pg/mL  Troponin I - Once  Result Value Ref Range   Troponin I <0.03 <0.03 ng/mL  Protime-INR  Result Value Ref Range   Prothrombin Time 28.0 (H) 11.4 - 15.2 seconds   INR 2.66   CBC with Differential  Result Value Ref Range   WBC 7.2 4.0 - 10.5 K/uL   RBC 3.95 (L) 4.22 - 5.81 MIL/uL   Hemoglobin 11.8 (L) 13.0 - 17.0 g/dL   HCT 37.8 (L) 39.0 - 52.0 %   MCV 95.7 80.0 - 100.0 fL   MCH 29.9 26.0 - 34.0 pg   MCHC 31.2 30.0 - 36.0 g/dL   RDW 14.5 11.5 - 15.5 %   Platelets 181 150 - 400 K/uL   nRBC 0.0 0.0 - 0.2 %   Neutrophils Relative % 71 %   Neutro Abs 5.1 1.7 - 7.7 K/uL   Lymphocytes Relative 15 %   Lymphs Abs 1.1 0.7 - 4.0 K/uL   Monocytes Relative 10 %   Monocytes Absolute 0.7 0.1 - 1.0 K/uL   Eosinophils Relative 4 %   Eosinophils Absolute 0.3 0.0 - 0.5 K/uL   Basophils Relative 0 %   Basophils Absolute 0.0 0.0 - 0.1 K/uL   Immature Granulocytes 0 %   Abs Immature Granulocytes 0.02 0.00 - 0.07 K/uL  Urinalysis, Routine w reflex microscopic  Result Value Ref Range   Color, Urine STRAW (A) YELLOW   APPearance CLEAR CLEAR   Specific Gravity, Urine 1.006 1.005 - 1.030   pH 7.0 5.0 - 8.0   Glucose, UA NEGATIVE NEGATIVE mg/dL   Hgb urine dipstick SMALL  (A) NEGATIVE   Bilirubin Urine NEGATIVE NEGATIVE   Ketones, ur NEGATIVE NEGATIVE mg/dL   Protein, ur NEGATIVE NEGATIVE mg/dL   Nitrite NEGATIVE NEGATIVE   Leukocytes, UA NEGATIVE NEGATIVE   RBC / HPF 0-5 0 - 5 RBC/hpf   WBC, UA 0-5 0 - 5 WBC/hpf   Bacteria, UA NONE SEEN NONE SEEN   Squamous Epithelial / LPF 0-5 0 - 5   Dg Chest 2 View  Result Date: 04/30/2018 CLINICAL DATA:  Shortness of breath.  Cough and wheezing. EXAM: CHEST - 2 VIEW COMPARISON:  11/23/2014 FINDINGS: The heart is mildly enlarged. There is tortuosity and calcification of the thoracic aorta. Diffuse increased interstitial markings with thickened peripheral interseptal lines (Kerley B lines). Findings are most consistent with interstitial pulmonary edema. No definite pleural effusions or focal infiltrates. IMPRESSION: Findings suggest interstitial pulmonary edema. No pleural effusions and no focal infiltrates. Electronically Signed   By: Marijo Sanes M.D.   On: 04/30/2018 16:42    1735:  Pt ambulated: Sats dropped to 86% R/A and pt c/o increasing SOB. Pt returned to stretcher, O2 N/C applied and initially increased to 3-4L, with Sats slowly increased into mid-high 90's. BNP elevated (no old to compare) with pulmonary edema on CXR; IV lasix ordered. T/C returned from [facility] Cards Dr. Harrington Challenger, case discussed, including:  HPI, pertinent PM/SHx, VS/PE, dx testing, ED course and treatment:  Agrees regarding need for diuresis, OK to stay at [facility] for treatment as there is no  other acute intervention needed at this time.  1800:  T/C returned from [facility] Dr. Marlowe Sax, case discussed, including:  HPI, pertinent PM/SHx, VS/PE, dx testing, ED course and treatment:  Agreeable to admit.          Final Clinical Impressions(s) / ED Diagnoses   Final diagnoses:  None    ED Discharge Orders    None       Francine Graven, DO 05/03/18 1352

## 2018-04-30 NOTE — Progress Notes (Signed)
PHARMACIST - PHYSICIAN ORDER COMMUNICATION  CONCERNING: P&T Medication Policy on Herbal Medications  DESCRIPTION:  This patient's order for: Glucosamine  has been noted.  This product(s) is classified as an "herbal" or natural product. Due to a lack of definitive safety studies or FDA approval, nonstandard manufacturing practices, plus the potential risk of unknown drug-drug interactions while on inpatient medications, the Pharmacy and Therapeutics Committee does not permit the use of "herbal" or natural products of this type within [place].   ACTION TAKEN: The pharmacy department is unable to verify this order at this time and your patient has been informed of this safety policy. Please reevaluate patient's clinical condition at discharge and address if the herbal or natural product(s) should be resumed at that time.  

## 2018-04-30 NOTE — ED Notes (Signed)
Stood patient to side of bed to urinate. Patient O2 saturation decreased to 86% on room air. Patient urinated in urinal and assisted back to bed in sitting position. Placed patient on 2L O2 via nasal canula and advised patient to take deep breaths. O2 saturation remained at 86%. Increased O2 to 3L and O2 saturation still remained at 86%. Increased oxygen to 4L, O2 saturation increased to 96% with deep breathing. Advised Dr Thurnell Garbe.

## 2018-04-30 NOTE — ED Notes (Signed)
Emptied urinal of 400 ml urine.

## 2018-05-01 DIAGNOSIS — J9601 Acute respiratory failure with hypoxia: Secondary | ICD-10-CM | POA: Diagnosis not present

## 2018-05-01 DIAGNOSIS — N179 Acute kidney failure, unspecified: Secondary | ICD-10-CM

## 2018-05-01 DIAGNOSIS — J45909 Unspecified asthma, uncomplicated: Secondary | ICD-10-CM

## 2018-05-01 LAB — BASIC METABOLIC PANEL
Anion gap: 10 (ref 5–15)
BUN: 24 mg/dL — ABNORMAL HIGH (ref 8–23)
CHLORIDE: 100 mmol/L (ref 98–111)
CO2: 26 mmol/L (ref 22–32)
Calcium: 8.8 mg/dL — ABNORMAL LOW (ref 8.9–10.3)
Creatinine, Ser: 1.12 mg/dL (ref 0.61–1.24)
GFR calc Af Amer: >60 mL/min (ref >60)
GFR calc non Af Amer: 60 mL/min — ABNORMAL LOW (ref >60)
Glucose, Bld: 150 mg/dL — ABNORMAL HIGH (ref 70–99)
Potassium: 3.6 mmol/L (ref 3.5–5.1)
SODIUM: 136 mmol/L (ref 135–145)

## 2018-05-01 LAB — FERRITIN: Ferritin: 47 ng/mL (ref 24–336)

## 2018-05-01 LAB — IRON AND TIBC
Iron: 17 ug/dL — ABNORMAL LOW (ref 45–182)
Saturation Ratios: 5 % — ABNORMAL LOW (ref 17.9–39.5)
TIBC: 328 ug/dL (ref 250–450)
UIBC: 311 ug/dL

## 2018-05-01 MED ORDER — IPRATROPIUM-ALBUTEROL 0.5-2.5 (3) MG/3ML IN SOLN: 3.0000 mL | RESPIRATORY_TRACT | Status: DC | PRN

## 2018-05-01 MED ORDER — WARFARIN SODIUM 2.5 MG PO TABS
2.5000 mg | ORAL_TABLET | ORAL | Status: DC
  Administered 2018-05-01: 2.5 mg via ORAL
  Filled 2018-05-01 (×2): qty 1

## 2018-05-01 MED ORDER — WARFARIN SODIUM 5 MG PO TABS
5.0000 mg | ORAL_TABLET | ORAL | Status: DC
  Administered 2018-05-02: 5 mg via ORAL
  Filled 2018-05-01: qty 1

## 2018-05-01 MED ORDER — POTASSIUM CHLORIDE CRYS ER 20 MEQ PO TBCR
40.0000 meq | EXTENDED_RELEASE_TABLET | Freq: Once | ORAL | Status: AC
  Administered 2018-05-01: 40 meq via ORAL
  Filled 2018-05-01: qty 2

## 2018-05-01 MED ORDER — IPRATROPIUM-ALBUTEROL 0.5-2.5 (3) MG/3ML IN SOLN
3.0000 mL | Freq: Two times a day (BID) | RESPIRATORY_TRACT | Status: DC
  Administered 2018-05-02 – 2018-05-03 (×3): 3 mL via RESPIRATORY_TRACT
  Filled 2018-05-01 (×3): qty 3

## 2018-05-01 MED ORDER — WARFARIN - PHARMACIST DOSING INPATIENT
Freq: Every day | Status: DC
  Administered 2018-05-01 – 2018-05-02 (×2)

## 2018-05-01 NOTE — Progress Notes (Signed)
ANTICOAGULATION CONSULT NOTE - Follow Up Consult  Pharmacy Consult for warfarin Indication: atrial fibrillation  Allergies  Allergen Reactions  . Carbapenems Other (See Comments)    Altered mental status  . Cephalosporins     Unknown reaction  . Penicillins Swelling and Other (See Comments)    Did it involve swelling of the face/tongue/throat, SOB, or low BP? Unknown-possible swelling Did it involve sudden or severe rash/hives, skin peeling, or any reaction on the inside of your mouth or nose? Unknown Did you need to seek medical attention at a hospital or doctor's office? Unknown When did it last happen?Over 10 years (possible swelling) If all above answers are "NO", may proceed with cephalosporin use.     Patient Measurements: Weight: 200 lb 1 oz (90.7 kg)  Vital Signs: BP: 130/73 (01/25 0600) Pulse Rate: 96 (01/25 0600)  Labs: Recent Labs    04/30/18 1557 05/01/18 0453  HGB 11.8*  --   HCT 37.8*  --   PLT 181  --   LABPROT 28.0*  --   INR 2.66  --   CREATININE 1.33* 1.12  TROPONINI <0.03  --     Estimated Creatinine Clearance: 53.9 mL/min (by C-G formula based on SCr of 1.12 mg/dL).   Medications:  (Not in a hospital admission)  Scheduled:  . aspirin EC  81 mg Oral q morning - 10a  . calcium-vitamin D  1 tablet Oral BID  . carvedilol  25 mg Oral BID WC  . finasteride  5 mg Oral q morning - 10a  . furosemide  40 mg Intravenous Q12H  . ipratropium-albuterol  3 mL Nebulization Q6H  . ipratropium-albuterol      . isosorbide mononitrate  60 mg Oral q morning - 10a  . levothyroxine  150 mcg Oral Q0600  . multivitamin with minerals  1 tablet Oral q morning - 10a  . predniSONE  40 mg Oral Q breakfast  . [START ON 05/02/2018] rosuvastatin  10 mg Oral Q Sun-1800  . warfarin  2.5 mg Oral Once per day on Tue Sat  . [START ON 05/02/2018] warfarin  5 mg Oral Once per day on Sun Mon Wed Thu Fri  . Warfarin - Pharmacist Dosing Inpatient   Does not apply q1800    Infusions:   PRN: acetaminophen **OR** acetaminophen, traMADol Anti-infectives (From admission, onward)   None      Assessment: 85 year old male with atrial fibrillation requiring anticoagulation with warfarin. Admit INR 2.66, therapeutic, patient takes warfarin 2.5mg  on tuesdays and saturdays, warfarin 5mg  on all other days.   Goal of Therapy:  INR 2-3 Monitor platelets by anticoagulation protocol: Yes   Plan:  Continue home dose of warfarin 2.5mg  tuesdays and saturdays, warfarin 5mg  on all other days of the week PT/INR Daily Monitor for S&S of bleeding   Donna Christen Ayson Cherubini 05/01/2018,7:11 AM

## 2018-05-01 NOTE — Progress Notes (Signed)
Patient transferred into 309. He is awake, oriented and able to move all ext. His vitals are stable. He is on 4LNC. He denies pain. Will continue to monitor closely.

## 2018-05-01 NOTE — ED Notes (Signed)
Pt given sprite 

## 2018-05-01 NOTE — Progress Notes (Signed)
Patient Demographics:    Jerry Cain, is a 85 y.o. male, DOB - Apr 30, 1931, EYC:[id number]  Admit date - 04/30/2018   Admitting Physician Shela Leff, MD  Outpatient Primary MD for the patient is Octavio Graves, DO  LOS - 0   Chief Complaint  Patient presents with  . Shortness of Breath        Subjective:    Jerry Cain today has no fevers, no emesis,  No chest pain, dyspnea on Exertion persist, orthopnea persist, voiding okay, no concerns about bleeding,   Assessment  & Plan :    Principal Problem:   Acute respiratory failure with hypoxia (HCC) Active Problems:   Atrial fibrillation (HCC)   HTN (hypertension)   CAD (coronary artery disease)   Acute exacerbation of CHF (congestive heart failure) ([place])   Reactive airway disease   AKI (acute kidney injury) [facility])  Brief summary 85 y.o. male with medical history significant of chronic diastolic congestive heart failure, hypertension, hypothyroidism, hyperlipidemia, GERD, CAD status post PCI, BPH, atrial fibrillation, anxiety admitted on 04/30/2018 with acute hypoxic respiratory failure secondary to acute exacerbation of preserved EF CHF, patient had wheezing on admission most likely due to cardiac asthma, no history of underlying significant pulmonary problems  Plan:- 1) acute hypoxic respiratory failure--- secondary to diastolic CHF exacerbation----denies to require oxygen   2)HFpEF--- admitted with acute on chronic diastolic CHF exacerbation, recent EF from December 2019 was 60 to 65%, continue IV diuresis, monitor electrolytes and renal function closely, daily weight and fluid input and output monitoring as well, fluid balance negative over 3500 cc admission so far, patient continues to have some shortness of breath, continues to require oxygen  3)H/o CAD--- post prior PCI, no chest pain, no ACS type symptoms continue carvedilol,  continue aspirin, continue Crestor  4) permanent atrial fibrillation--- chads vascular score is 4, continue Coumadin, INR therapeutic, continue carvedilol for rate control  5)Hypothyroidism--- stable, continue levothyroxine  6) acute anemia noted, baseline hemoglobin usually around its hemoglobin this time around 11.8, monitor closely, no evidence of ongoing bleeding at this time, anemia work-up in progress  Disposition/Need for in-Hospital Stay- patient unable to be discharged at this time due to acute exacerbation of preserved EF CHF with hypoxia, patient needs IV diuresis and monitoring of renal function with diuresis  Code Status : Full  Family Communication:   na   Disposition Plan  : TBD  Consults  :  na  DVT Prophylaxis  : Coumadin Lab Results  Component Value Date   PLT 181 04/30/2018    Inpatient Medications  Scheduled Meds: . aspirin EC  81 mg Oral q morning - 10a  . calcium-vitamin D  1 tablet Oral BID  . carvedilol  25 mg Oral BID WC  . finasteride  5 mg Oral q morning - 10a  . furosemide  40 mg Intravenous Q12H  . ipratropium-albuterol  3 mL Nebulization Q6H  . isosorbide mononitrate  60 mg Oral q morning - 10a  . levothyroxine  150 mcg Oral Q0600  . multivitamin with minerals  1 tablet Oral q morning - 10a  . predniSONE  40 mg Oral Q breakfast  . [START ON 05/02/2018] rosuvastatin  10 mg Oral Q Sun-1800  . warfarin  2.5 mg Oral Once per day on Tue Sat  . [START ON 05/02/2018] warfarin  5 mg Oral Once per day on Sun Mon Wed Thu Fri  . Warfarin - Pharmacist Dosing Inpatient   Does not apply q1800   Continuous Infusions: PRN Meds:.acetaminophen **OR** acetaminophen, traMADol    Anti-infectives (From admission, onward)   None        Objective:   Vitals:   05/01/18 0914 05/01/18 1409 05/01/18 1532 05/01/18 1800  BP:  (!) 142/81    Pulse:  (!) 102    Resp:  17    Temp:  97.6 F (36.4 C)    TempSrc:  Oral    SpO2: 96% 91% 93% 92%  Weight:        Height:        Wt Readings from Last 3 Encounters:  05/01/18 90.7 kg  03/18/18 95.3 kg  08/25/17 92.1 kg     Intake/Output Summary (Last 24 hours) at 05/01/2018 1835 Last data filed at 05/01/2018 1700 Gross per 24 hour  Intake 600 ml  Output 4175 ml  Net -3575 ml     Physical Exam Patient is examined daily including today on 05/01/18 , exams remain the same as of yesterday except that has changed   Gen:- Awake Alert, no acute distress HEENT:- [place].AT, No sclera icterus Neck-Supple Neck,No JVD,.  Lungs-diminished in bases, scattered rales  CV- S1, S2 normal, irregularly irregular Abd-  +ve B.Sounds, Abd Soft, No tenderness,    Extremity/Skin:-1+ pitting edema, pedal pulses present  Psych-affect is appropriate, oriented x3 Neuro-no new focal deficits, no tremors   Data Review:   Micro Results No results found for this or any previous visit (from the past 240 hour(s)).  Radiology Reports Dg Chest 2 View  Result Date: 04/30/2018 CLINICAL DATA:  Shortness of breath.  Cough and wheezing. EXAM: CHEST - 2 VIEW COMPARISON:  11/23/2014 FINDINGS: The heart is mildly enlarged. There is tortuosity and calcification of the thoracic aorta. Diffuse increased interstitial markings with thickened peripheral interseptal lines (Kerley B lines). Findings are most consistent with interstitial pulmonary edema. No definite pleural effusions or focal infiltrates. IMPRESSION: Findings suggest interstitial pulmonary edema. No pleural effusions and no focal infiltrates. Electronically Signed   By: Marijo Sanes M.D.   On: 04/30/2018 16:42     CBC Recent Labs  Lab 04/30/18 1557  WBC 7.2  HGB 11.8*  HCT 37.8*  PLT 181  MCV 95.7  MCH 29.9  MCHC 31.2  RDW 14.5  LYMPHSABS 1.1  MONOABS 0.7  EOSABS 0.3  BASOSABS 0.0    Chemistries  Recent Labs  Lab 04/30/18 1557 05/01/18 0453  NA 136 136  K 4.3 3.6  CL 101 100  CO2 26 26  GLUCOSE 103* 150*  BUN 26* 24*  CREATININE 1.33* 1.12   CALCIUM 8.9 8.8*   ------------------------------------------------------------------------------------------------------------------ No results for input(s): CHOL, HDL, LDLCALC, TRIG, CHOLHDL, LDLDIRECT in the last 72 hours.  Lab Results  Component Value Date   HGBA1C  06/19/2007    5.7 (NOTE)   The ADA recommends the following therapeutic goals for glycemic   control related to Hgb A1C measurement:   Goal of Therapy:   < 7.0% Hgb A1C   Action Suggested:  > 8.0% Hgb A1C   Ref:  Diabetes Care, 22, Suppl. 1, 1999   ------------------------------------------------------------------------------------------------------------------ No results for input(s): TSH, T4TOTAL, T3FREE, THYROIDAB in the last 72 hours.  Invalid input(s): FREET3 ------------------------------------------------------------------------------------------------------------------ Recent Labs    05/01/18 0453  FERRITIN 47  TIBC 328  IRON 17*    Coagulation profile Recent Labs  Lab 04/30/18 1557  INR 2.66    No results for input(s): DDIMER in the last 72 hours.  Cardiac Enzymes Recent Labs  Lab 04/30/18 1557  TROPONINI <0.03   ------------------------------------------------------------------------------------------------------------------    Component Value Date/Time   BNP 345.0 (H) 04/30/2018 1557     Roxan Hockey M.D on 05/01/2018 at 6:35 PM  Go to [email or web address] - for contact info  [facility] - Office  [phone]

## 2018-05-02 DIAGNOSIS — Z96652 Presence of left artificial knee joint: Secondary | ICD-10-CM | POA: Diagnosis present

## 2018-05-02 DIAGNOSIS — Z9842 Cataract extraction status, left eye: Secondary | ICD-10-CM | POA: Diagnosis not present

## 2018-05-02 DIAGNOSIS — J9601 Acute respiratory failure with hypoxia: Secondary | ICD-10-CM | POA: Diagnosis not present

## 2018-05-02 DIAGNOSIS — N179 Acute kidney failure, unspecified: Secondary | ICD-10-CM | POA: Diagnosis present

## 2018-05-02 DIAGNOSIS — I5033 Acute on chronic diastolic (congestive) heart failure: Secondary | ICD-10-CM | POA: Diagnosis present

## 2018-05-02 DIAGNOSIS — Z79899 Other long term (current) drug therapy: Secondary | ICD-10-CM | POA: Diagnosis not present

## 2018-05-02 DIAGNOSIS — I110 Hypertensive heart disease with heart failure: Secondary | ICD-10-CM | POA: Diagnosis present

## 2018-05-02 DIAGNOSIS — E7800 Pure hypercholesterolemia, unspecified: Secondary | ICD-10-CM | POA: Diagnosis present

## 2018-05-02 DIAGNOSIS — E785 Hyperlipidemia, unspecified: Secondary | ICD-10-CM | POA: Diagnosis present

## 2018-05-02 DIAGNOSIS — Z7901 Long term (current) use of anticoagulants: Secondary | ICD-10-CM | POA: Diagnosis not present

## 2018-05-02 DIAGNOSIS — K219 Gastro-esophageal reflux disease without esophagitis: Secondary | ICD-10-CM | POA: Diagnosis present

## 2018-05-02 DIAGNOSIS — F419 Anxiety disorder, unspecified: Secondary | ICD-10-CM | POA: Diagnosis present

## 2018-05-02 DIAGNOSIS — I2510 Atherosclerotic heart disease of native coronary artery without angina pectoris: Secondary | ICD-10-CM | POA: Diagnosis present

## 2018-05-02 DIAGNOSIS — J45901 Unspecified asthma with (acute) exacerbation: Secondary | ICD-10-CM | POA: Diagnosis present

## 2018-05-02 DIAGNOSIS — E039 Hypothyroidism, unspecified: Secondary | ICD-10-CM | POA: Diagnosis present

## 2018-05-02 DIAGNOSIS — G8929 Other chronic pain: Secondary | ICD-10-CM | POA: Diagnosis present

## 2018-05-02 DIAGNOSIS — Z888 Allergy status to other drugs, medicaments and biological substances status: Secondary | ICD-10-CM | POA: Diagnosis not present

## 2018-05-02 DIAGNOSIS — Z79891 Long term (current) use of opiate analgesic: Secondary | ICD-10-CM | POA: Diagnosis not present

## 2018-05-02 DIAGNOSIS — Z955 Presence of coronary angioplasty implant and graft: Secondary | ICD-10-CM | POA: Diagnosis not present

## 2018-05-02 DIAGNOSIS — R0602 Shortness of breath: Secondary | ICD-10-CM | POA: Diagnosis present

## 2018-05-02 DIAGNOSIS — Z9841 Cataract extraction status, right eye: Secondary | ICD-10-CM | POA: Diagnosis not present

## 2018-05-02 DIAGNOSIS — I4821 Permanent atrial fibrillation: Secondary | ICD-10-CM | POA: Diagnosis present

## 2018-05-02 DIAGNOSIS — Z86010 Personal history of colonic polyps: Secondary | ICD-10-CM | POA: Diagnosis not present

## 2018-05-02 DIAGNOSIS — I509 Heart failure, unspecified: Secondary | ICD-10-CM

## 2018-05-02 DIAGNOSIS — N400 Enlarged prostate without lower urinary tract symptoms: Secondary | ICD-10-CM | POA: Diagnosis present

## 2018-05-02 DIAGNOSIS — Z79890 Hormone replacement therapy (postmenopausal): Secondary | ICD-10-CM | POA: Diagnosis not present

## 2018-05-02 LAB — CBC
HCT: 39.7 % (ref 39.0–52.0)
Hemoglobin: 13 g/dL (ref 13.0–17.0)
MCH: 30.4 pg (ref 26.0–34.0)
MCHC: 32.7 g/dL (ref 30.0–36.0)
MCV: 93 fL (ref 80.0–100.0)
Platelets: 228 10*3/uL (ref 150–400)
RBC: 4.27 MIL/uL (ref 4.22–5.81)
RDW: 14.6 % (ref 11.5–15.5)
WBC: 15.8 10*3/uL — ABNORMAL HIGH (ref 4.0–10.5)
nRBC: 0 % (ref 0.0–0.2)

## 2018-05-02 LAB — BASIC METABOLIC PANEL
Anion gap: 11 (ref 5–15)
BUN: 33 mg/dL — ABNORMAL HIGH (ref 8–23)
CO2: 26 mmol/L (ref 22–32)
CREATININE: 1.01 mg/dL (ref 0.61–1.24)
Calcium: 9 mg/dL (ref 8.9–10.3)
Chloride: 102 mmol/L (ref 98–111)
GFR calc Af Amer: >60 mL/min (ref >60)
GFR calc non Af Amer: >60 mL/min (ref >60)
Glucose, Bld: 131 mg/dL — ABNORMAL HIGH (ref 70–99)
Potassium: 3.3 mmol/L — ABNORMAL LOW (ref 3.5–5.1)
Sodium: 139 mmol/L (ref 135–145)

## 2018-05-02 LAB — PROTIME-INR
INR: 2.72
PROTHROMBIN TIME: 28.5 s — AB (ref 11.4–15.2)

## 2018-05-02 LAB — TSH: TSH: 5.484 u[IU]/mL — ABNORMAL HIGH (ref 0.350–4.500)

## 2018-05-02 NOTE — Progress Notes (Addendum)
Patient Demographics:    Jerry Cain, is a 85 y.o. male, DOB - [birth year], WIO:[id number]  Admit date - 04/30/2018   Admitting Physician Shela Leff, MD  Outpatient Primary MD for the patient is Octavio Graves, DO  LOS - 0   Chief Complaint  Patient presents with  . Shortness of Breath        Subjective:    Jerry Cain today has no fevers, no emesis,  No chest pain, overall dyspnea on Exertion is improving, orthopnea improving, voiding okay, overnight patient was requiring 6 to 7 L of oxygen via nasal cannula, now weaning down  Assessment  & Plan :    Principal Problem:   Acute respiratory failure with hypoxia ([place]) Active Problems:   Atrial fibrillation (HCC)   HTN (hypertension)   CAD (coronary artery disease)   Acute exacerbation of CHF (congestive heart failure) ([place])   Reactive airway disease   AKI (acute kidney injury) [facility])  Brief summary 85 y.o. male with medical history significant of chronic diastolic congestive heart failure, hypertension, hypothyroidism, hyperlipidemia, GERD, CAD status post PCI, BPH, atrial fibrillation, anxiety admitted on 04/30/2018 with acute hypoxic respiratory failure secondary to acute exacerbation of preserved EF CHF, patient had wheezing on admission most likely due to cardiac asthma, no history of underlying significant pulmonary problems  Plan:- 1) acute hypoxic respiratory failure--- secondary to diastolic CHF exacerbation----overnight patient was requiring 6 to 7 L of oxygen via nasal cannula, now weaning down, try to wean off oxygen, currently on 2 to 3 L/min via nasal cannula, patient was not on oxygen prior to admission   2)HFpEF--- admitted with acute on chronic diastolic CHF exacerbation, recent EF from December 2019 was 60 to 65%, continue IV diuresis, monitor electrolytes and renal function closely, weight appears to be up at least  1 pound up to 91.5 kg, however fluid balance is negative about 2500 over the last 24 hours and 6 L since admission, ----suspect fluid balance chart probably is inaccurate, continue IV diuresis, continue to try to wean off oxygen,  3)H/o CAD--- post prior PCI, no chest pain, no ACS type symptoms continue carvedilol, continue aspirin, continue Crestor  4)Permanent Atrial Fibrillation--- chads vascular score is 4, continue Coumadin, INR is therapeutic, continue carvedilol for rate control  5)Hypothyroidism--- stable, continue levothyroxine  6) Disposition/Need for in-Hospital Stay- patient unable to be discharged at this time due to acute exacerbation of preserved EF CHF with hypoxia, patient needs IV diuresis and monitoring of renal function with diuresis---- overnight patient was requiring 6 to 7 L of oxygen via nasal cannula, now weaning down   Code Status : Full  Family Communication:   na   Disposition Plan  : TBD  Consults  :  na  DVT Prophylaxis  : Coumadin Lab Results  Component Value Date   PLT 228 05/02/2018    Inpatient Medications  Scheduled Meds: . aspirin EC  81 mg Oral q morning - 10a  . calcium-vitamin D  1 tablet Oral BID  . carvedilol  25 mg Oral BID WC  . finasteride  5 mg Oral q morning - 10a  . furosemide  40 mg Intravenous Q12H  . ipratropium-albuterol  3 mL Nebulization BID  . isosorbide mononitrate  60  mg Oral q morning - 10a  . levothyroxine  150 mcg Oral Q0600  . multivitamin with minerals  1 tablet Oral q morning - 10a  . rosuvastatin  10 mg Oral Q Sun-1800  . warfarin  2.5 mg Oral Once per day on Tue Sat  . warfarin  5 mg Oral Once per day on Sun Mon Wed Thu Fri  . Warfarin - Pharmacist Dosing Inpatient   Does not apply q1800   Continuous Infusions: PRN Meds:.acetaminophen **OR** acetaminophen, ipratropium-albuterol, traMADol    Anti-infectives (From admission, onward)   None        Objective:   Vitals:   05/02/18 0532 05/02/18 0802  05/02/18 1352 05/02/18 1352  BP: (!) 156/94  124/74 124/74  Pulse: 81  88 82  Resp: 18  18 18   Temp: 97.6 F (36.4 C)  97.7 F (36.5 C) 97.7 F (36.5 C)  TempSrc: Oral  Oral Oral  SpO2: 91% 92% 95% 95%  Weight: 91.5 kg     Height:        Wt Readings from Last 3 Encounters:  05/02/18 91.5 kg  03/18/18 95.3 kg  08/25/17 92.1 kg     Intake/Output Summary (Last 24 hours) at 05/02/2018 1602 Last data filed at 05/02/2018 1300 Gross per 24 hour  Intake 480 ml  Output 2950 ml  Net -2470 ml     Physical Exam Patient is examined daily including today on 05/02/18 , exams remain the same as of yesterday except that has changed   Gen:- Awake Alert, no acute distress HEENT:- [place].AT, No sclera icterus Neck-Supple Neck,No JVD,.  Nose- [place] 2 to 3  L/min Lungs-diminished in bases, few scattered rales  CV- S1, S2 normal, irregularly irregular Abd-  +ve B.Sounds, Abd Soft, No tenderness,    Extremity/Skin:- Trace pitting edema, pedal pulses present  Psych-  affect is appropriate, oriented x3 Neuro-no new focal deficits, no tremors--   Data Review:   Micro Results No results found for this or any previous visit (from the past 240 hour(s)).  Radiology Reports Dg Chest 2 View  Result Date: 04/30/2018 CLINICAL DATA:  Shortness of breath.  Cough and wheezing. EXAM: CHEST - 2 VIEW COMPARISON:  11/23/2014 FINDINGS: The heart is mildly enlarged. There is tortuosity and calcification of the thoracic aorta. Diffuse increased interstitial markings with thickened peripheral interseptal lines (Kerley B lines). Findings are most consistent with interstitial pulmonary edema. No definite pleural effusions or focal infiltrates. IMPRESSION: Findings suggest interstitial pulmonary edema. No pleural effusions and no focal infiltrates. Electronically Signed   By: Marijo Sanes M.D.   On: 04/30/2018 16:42     CBC Recent Labs  Lab 04/30/18 1557 05/02/18 0634  WBC 7.2 15.8*  HGB 11.8* 13.0  HCT 37.8*  39.7  PLT 181 228  MCV 95.7 93.0  MCH 29.9 30.4  MCHC 31.2 32.7  RDW 14.5 14.6  LYMPHSABS 1.1  --   MONOABS 0.7  --   EOSABS 0.3  --   BASOSABS 0.0  --     Chemistries  Recent Labs  Lab 04/30/18 1557 05/01/18 0453 05/02/18 0634  NA 136 136 139  K 4.3 3.6 3.3*  CL 101 100 102  CO2 26 26 26   GLUCOSE 103* 150* 131*  BUN 26* 24* 33*  CREATININE 1.33* 1.12 1.01  CALCIUM 8.9 8.8* 9.0   ------------------------------------------------------------------------------------------------------------------ No results for input(s): CHOL, HDL, LDLCALC, TRIG, CHOLHDL, LDLDIRECT in the last 72 hours.  Lab Results  Component Value Date  HGBA1C  06/19/2007    5.7 (NOTE)   The ADA recommends the following therapeutic goals for glycemic   control related to Hgb A1C measurement:   Goal of Therapy:   < 7.0% Hgb A1C   Action Suggested:  > 8.0% Hgb A1C   Ref:  Diabetes Care, 22, Suppl. 1, 1999   ------------------------------------------------------------------------------------------------------------------ Recent Labs    05/02/18 0634  TSH 5.484*   ------------------------------------------------------------------------------------------------------------------ Recent Labs    05/01/18 0453  FERRITIN 47  TIBC 328  IRON 17*   Coagulation profile Recent Labs  Lab 04/30/18 1557 05/02/18 0634  INR 2.66 2.72    No results for input(s): DDIMER in the last 72 hours.  Cardiac Enzymes Recent Labs  Lab 04/30/18 1557  TROPONINI <0.03   ------------------------------------------------------------------------------------------------------------------    Component Value Date/Time   BNP 345.0 (H) 04/30/2018 1557    Roxan Hockey M.D on 05/02/2018 at 4:02 PM  Go to [email or web address] - for contact info  [facility] - Office  [phone]

## 2018-05-02 NOTE — Progress Notes (Signed)
ANTICOAGULATION CONSULT NOTE - Follow Up Consult  Pharmacy Consult for warfarin Indication: atrial fibrillation  Allergies  Allergen Reactions  . Carbapenems Other (See Comments)    Altered mental status  . Cephalosporins     Unknown reaction  . Penicillins Swelling and Other (See Comments)    Did it involve swelling of the face/tongue/throat, SOB, or low BP? Unknown-possible swelling Did it involve sudden or severe rash/hives, skin peeling, or any reaction on the inside of your mouth or nose? Unknown Did you need to seek medical attention at a hospital or doctor's office? Unknown When did it last happen?Over 10 years (possible swelling) If all above answers are "NO", may proceed with cephalosporin use.     Patient Measurements: Height: 5\' 10"  (177.8 cm) Weight: 201 lb 11.5 oz (91.5 kg) IBW/kg (Calculated) : 73  Vital Signs: Temp: 97.6 F (36.4 C) (01/26 0532) Temp Source: Oral (01/26 0532) BP: 156/94 (01/26 0532) Pulse Rate: 81 (01/26 0532)  Labs: Recent Labs    04/30/18 1557 05/01/18 0453 05/02/18 0634  HGB 11.8*  --  13.0  HCT 37.8*  --  39.7  PLT 181  --  228  LABPROT 28.0*  --  28.5*  INR 2.66  --  2.72  CREATININE 1.33* 1.12 1.01  TROPONINI <0.03  --   --     Estimated Creatinine Clearance: 61.9 mL/min (by C-G formula based on SCr of 1.01 mg/dL).   Medications:  Medications Prior to Admission  Medication Sig Dispense Refill Last Dose  . aspirin EC 81 MG tablet Take 81 mg by mouth every morning.   04/30/2018 at Unknown time  . Calcium Carbonate-Vitamin D (CALCIUM + D) 600-200 MG-UNIT TABS Take 1 tablet by mouth 2 (two) times daily.    04/30/2018 at Unknown time  . carvedilol (COREG) 25 MG tablet TAKE (1) TABLET BY MOUTH TWICE DAILY WITH A MEAL. (Patient taking differently: Take 25 mg by mouth 2 (two) times daily with a meal. ) 180 tablet 2 04/30/2018 at [place]  . finasteride (PROSCAR) 5 MG tablet Take 5 mg by mouth every morning.    04/30/2018 at Unknown  time  . furosemide (LASIX) 20 MG tablet TAKE (1) TABLET BY MOUTH ONCE DAILY. (Patient taking differently: Take 20 mg by mouth every morning. ) 90 tablet 4 04/30/2018 at Unknown time  . Glucosamine 750 MG TABS Take 750 mg by mouth 2 (two) times daily.   04/30/2018 at Unknown time  . isosorbide mononitrate (IMDUR) 60 MG 24 hr tablet TAKE ONE TABLET ONCE DAILY IN THE MORNING. (Patient taking differently: Take 60 mg by mouth every morning. ) 90 tablet 3 04/30/2018 at Unknown time  . levothyroxine (SYNTHROID, LEVOTHROID) 150 MCG tablet Take 150 mcg by mouth every morning.   04/30/2018 at Unknown time  . lisinopril (PRINIVIL,ZESTRIL) 20 MG tablet TAKE ONE TABLET BY MOUTH AT BEDTIME (8PM). (Patient taking differently: Take 20 mg by mouth at bedtime. ) 30 tablet 10 04/29/2018 at Unknown time  . Multiple Vitamin (MULTIVITAMIN) capsule Take 1 capsule by mouth every morning.    04/30/2018 at Unknown time  . nitroGLYCERIN (NITROSTAT) 0.4 MG SL tablet DISSOLVE 1 TABLET UNDER TONGUE EVERY 5 MINUTES UP TO 15 MIN FOR CHESTPAIN. IF NO RELIEF CALL 911. (Patient taking differently: Place 0.4 mg under the tongue every 5 (five) minutes as needed for chest pain. ) 25 tablet 6 unknown  . rosuvastatin (CRESTOR) 10 MG tablet TAKE (1) TABLET BY MOUTH ONCE A WEEK. (Patient taking differently: Take  10 mg by mouth every Sunday. ) 4 tablet 11 04/25/2018 at Unknown time  . traMADol (ULTRAM) 50 MG tablet Take 50 mg by mouth every 6 (six) hours as needed for moderate pain or severe pain.    unknown  . warfarin (COUMADIN) 5 MG tablet TAKE 1 TABLET BY MOUTH DAILY OR AS DIRECTED BY COUMADIN CLINIC. (Patient taking differently: Take 2.5-5 mg by mouth See admin instructions. 2.5mg  on Tuesdays and Saturdays. Take 5mg  on all other days) 90 tablet 0 04/30/2018 at 800a   Scheduled:  . aspirin EC  81 mg Oral q morning - 10a  . calcium-vitamin D  1 tablet Oral BID  . carvedilol  25 mg Oral BID WC  . finasteride  5 mg Oral q morning - 10a  .  furosemide  40 mg Intravenous Q12H  . ipratropium-albuterol  3 mL Nebulization BID  . isosorbide mononitrate  60 mg Oral q morning - 10a  . levothyroxine  150 mcg Oral Q0600  . multivitamin with minerals  1 tablet Oral q morning - 10a  . rosuvastatin  10 mg Oral Q Sun-1800  . warfarin  2.5 mg Oral Once per day on Tue Sat  . warfarin  5 mg Oral Once per day on Sun Mon Wed Thu Fri  . Warfarin - Pharmacist Dosing Inpatient   Does not apply q1800   Infusions:   PRN: acetaminophen **OR** acetaminophen, ipratropium-albuterol, traMADol Anti-infectives (From admission, onward)   None      Assessment: 85 year old male with atrial fibrillation requiring anticoagulation with warfarin. Admit INR 2.66, therapeutic, patient takes warfarin 2.5mg  on tuesdays and saturdays, warfarin 5mg  on all other days. INR 2.72 today.  Goal of Therapy:  INR 2-3 Monitor platelets by anticoagulation protocol: Yes   Plan:  Continue home dose of warfarin 2.5mg  tuesdays and saturdays, warfarin 5mg  on all other days of the week PT/INR Daily Monitor for S&S of bleeding   Donna Christen Fredric Slabach 05/02/2018,9:17 AM

## 2018-05-03 ENCOUNTER — Inpatient Hospital Stay (HOSPITAL_COMMUNITY): Payer: Medicare Other

## 2018-05-03 LAB — CBC
HCT: 41.5 % (ref 39.0–52.0)
Hemoglobin: 13.2 g/dL (ref 13.0–17.0)
MCH: 30.1 pg (ref 26.0–34.0)
MCHC: 31.8 g/dL (ref 30.0–36.0)
MCV: 94.7 fL (ref 80.0–100.0)
Platelets: 225 10*3/uL (ref 150–400)
RBC: 4.38 MIL/uL (ref 4.22–5.81)
RDW: 14.6 % (ref 11.5–15.5)
WBC: 11.9 10*3/uL — ABNORMAL HIGH (ref 4.0–10.5)
nRBC: 0 % (ref 0.0–0.2)

## 2018-05-03 LAB — PROTIME-INR
INR: 2.89
Prothrombin Time: 29.8 seconds — ABNORMAL HIGH (ref 11.4–15.2)

## 2018-05-03 MED ORDER — ISOSORBIDE MONONITRATE ER 60 MG PO TB24: ORAL_TABLET | ORAL | 3 refills | Status: DC

## 2018-05-03 MED ORDER — POTASSIUM CHLORIDE CRYS ER 20 MEQ PO TBCR
40.0000 meq | EXTENDED_RELEASE_TABLET | Freq: Once | ORAL | Status: AC
  Administered 2018-05-03: 40 meq via ORAL
  Filled 2018-05-03: qty 2

## 2018-05-03 MED ORDER — LISINOPRIL 20 MG PO TABS: 20.0000 mg | ORAL_TABLET | Freq: Every day | ORAL | 10 refills | Status: DC

## 2018-05-03 MED ORDER — FUROSEMIDE 20 MG PO TABS: 20.0000 mg | ORAL_TABLET | Freq: Every morning | ORAL | 1 refills | Status: DC

## 2018-05-03 MED ORDER — CARVEDILOL 25 MG PO TABS: ORAL_TABLET | ORAL | 2 refills | Status: DC

## 2018-05-03 MED ORDER — WARFARIN SODIUM 2.5 MG PO TABS: 2.5000 mg | ORAL_TABLET | Freq: Once | ORAL | Status: DC

## 2018-05-03 MED ORDER — ACETAMINOPHEN 325 MG PO TABS: 650.0000 mg | ORAL_TABLET | Freq: Four times a day (QID) | ORAL | 1 refills | Status: DC | PRN

## 2018-05-03 MED ORDER — ASPIRIN EC 81 MG PO TBEC: 81.0000 mg | DELAYED_RELEASE_TABLET | Freq: Every day | ORAL | 1 refills | Status: DC

## 2018-05-03 NOTE — Progress Notes (Signed)
ANTICOAGULATION CONSULT NOTE - Follow Up Consult  Pharmacy Consult for warfarin Indication: atrial fibrillation  Allergies  Allergen Reactions  . Carbapenems Other (See Comments)    Altered mental status  . Cephalosporins     Unknown reaction  . Penicillins Swelling and Other (See Comments)    Did it involve swelling of the face/tongue/throat, SOB, or low BP? Unknown-possible swelling Did it involve sudden or severe rash/hives, skin peeling, or any reaction on the inside of your mouth or nose? Unknown Did you need to seek medical attention at a hospital or doctor's office? Unknown When did it last happen?Over 10 years (possible swelling) If all above answers are "NO", may proceed with cephalosporin use.     Patient Measurements: Height: 5\' 10"  (177.8 cm) Weight: 201 lb 15.1 oz (91.6 kg) IBW/kg (Calculated) : 73  Vital Signs: Temp: 99.6 F (37.6 C) (01/27 0448) Temp Source: Oral (01/27 0448) BP: 124/73 (01/27 0448) Pulse Rate: 113 (01/27 0448)  Labs: Recent Labs    04/30/18 1557 05/01/18 0453 05/02/18 0634 05/03/18 0458  HGB 11.8*  --  13.0 13.2  HCT 37.8*  --  39.7 41.5  PLT 181  --  228 225  LABPROT 28.0*  --  28.5* 29.8*  INR 2.66  --  2.72 2.89  CREATININE 1.33* 1.12 1.01  --   TROPONINI <0.03  --   --   --     Estimated Creatinine Clearance: 61.9 mL/min (by C-G formula based on SCr of 1.01 mg/dL).   Medications:  Medications Prior to Admission  Medication Sig Dispense Refill Last Dose  . aspirin EC 81 MG tablet Take 81 mg by mouth every morning.   04/30/2018 at Unknown time  . Calcium Carbonate-Vitamin D (CALCIUM + D) 600-200 MG-UNIT TABS Take 1 tablet by mouth 2 (two) times daily.    04/30/2018 at Unknown time  . carvedilol (COREG) 25 MG tablet TAKE (1) TABLET BY MOUTH TWICE DAILY WITH A MEAL. (Patient taking differently: Take 25 mg by mouth 2 (two) times daily with a meal. ) 180 tablet 2 04/30/2018 at [place]  . finasteride (PROSCAR) 5 MG tablet Take 5  mg by mouth every morning.    04/30/2018 at Unknown time  . furosemide (LASIX) 20 MG tablet TAKE (1) TABLET BY MOUTH ONCE DAILY. (Patient taking differently: Take 20 mg by mouth every morning. ) 90 tablet 4 04/30/2018 at Unknown time  . Glucosamine 750 MG TABS Take 750 mg by mouth 2 (two) times daily.   04/30/2018 at Unknown time  . isosorbide mononitrate (IMDUR) 60 MG 24 hr tablet TAKE ONE TABLET ONCE DAILY IN THE MORNING. (Patient taking differently: Take 60 mg by mouth every morning. ) 90 tablet 3 04/30/2018 at Unknown time  . levothyroxine (SYNTHROID, LEVOTHROID) 150 MCG tablet Take 150 mcg by mouth every morning.   04/30/2018 at Unknown time  . lisinopril (PRINIVIL,ZESTRIL) 20 MG tablet TAKE ONE TABLET BY MOUTH AT BEDTIME (8PM). (Patient taking differently: Take 20 mg by mouth at bedtime. ) 30 tablet 10 04/29/2018 at Unknown time  . Multiple Vitamin (MULTIVITAMIN) capsule Take 1 capsule by mouth every morning.    04/30/2018 at Unknown time  . nitroGLYCERIN (NITROSTAT) 0.4 MG SL tablet DISSOLVE 1 TABLET UNDER TONGUE EVERY 5 MINUTES UP TO 15 MIN FOR CHESTPAIN. IF NO RELIEF CALL 911. (Patient taking differently: Place 0.4 mg under the tongue every 5 (five) minutes as needed for chest pain. ) 25 tablet 6 unknown  . rosuvastatin (CRESTOR) 10 MG  tablet TAKE (1) TABLET BY MOUTH ONCE A WEEK. (Patient taking differently: Take 10 mg by mouth every Sunday. ) 4 tablet 11 04/25/2018 at Unknown time  . traMADol (ULTRAM) 50 MG tablet Take 50 mg by mouth every 6 (six) hours as needed for moderate pain or severe pain.    unknown  . warfarin (COUMADIN) 5 MG tablet TAKE 1 TABLET BY MOUTH DAILY OR AS DIRECTED BY COUMADIN CLINIC. (Patient taking differently: Take 2.5-5 mg by mouth See admin instructions. 2.5mg  on Tuesdays and Saturdays. Take 5mg  on all other days) 90 tablet 0 04/30/2018 at 800a   Scheduled:  . aspirin EC  81 mg Oral q morning - 10a  . calcium-vitamin D  1 tablet Oral BID  . carvedilol  25 mg Oral BID WC  .  finasteride  5 mg Oral q morning - 10a  . furosemide  40 mg Intravenous Q12H  . ipratropium-albuterol  3 mL Nebulization BID  . isosorbide mononitrate  60 mg Oral q morning - 10a  . levothyroxine  150 mcg Oral Q0600  . multivitamin with minerals  1 tablet Oral q morning - 10a  . rosuvastatin  10 mg Oral Q Sun-1800  . warfarin  2.5 mg Oral Once per day on Tue Sat  . warfarin  5 mg Oral Once per day on Sun Mon Wed Thu Fri  . Warfarin - Pharmacist Dosing Inpatient   Does not apply q1800   Infusions:   PRN: acetaminophen **OR** acetaminophen, ipratropium-albuterol, traMADol Anti-infectives (From admission, onward)   None      Assessment: 85 year old male with atrial fibrillation requiring anticoagulation with warfarin. Admit INR 2.66, therapeutic, patient takes warfarin 2.5mg  on tuesdays and saturdays, warfarin 5mg  on all other days. INR 2.89 today.  Goal of Therapy:  INR 2-3 Monitor platelets by anticoagulation protocol: Yes   Plan:  Warfarin 2.5 mg x 1 dose. Monitor daily INR and s/s of bleeding.   Revonda Standard Rashika Bettes 05/03/2018,9:07 AM

## 2018-05-03 NOTE — Progress Notes (Signed)
Patient states understanding of discharge intructions

## 2018-05-03 NOTE — Progress Notes (Signed)
Patient's oxygen saturations on room air at rest is 87% and his oxygen saturation on 2 liters of oxygen at rest is 92%.

## 2018-05-03 NOTE — Discharge Summary (Signed)
Jerry Cain, is a 85 y.o. male  DOB 1931-08-02  MRN [id number].  Admission date:  04/30/2018  Admitting Physician  Shela Leff, MD  Discharge Date:  05/03/2018   Primary MD  Octavio Graves, DO  Recommendations for primary care physician for things to follow:   1)Very low-salt diet advised 2)Weigh yourself daily, call if you gain more than 3 pounds in 1 day or more than 5 pounds in 1 week as your diuretic medications may need to be adjusted 3)recheck PT/INR, recheck CBC and BMP with PCP within a week 4) you are taking Coumadin which is a blood thinner so Avoid ibuprofen/Advil/Aleve/Motrin/Goody Powders/Naproxen/BC powders/Meloxicam/Diclofenac/Indomethacin and other Nonsteroidal anti-inflammatory medications as these will make you more likely to bleed and can cause stomach ulcers, can also cause Kidney problems.  5) use oxygen continuously via nasal cannula at 2 L/min   Admission Diagnosis  Shortness of breath [R06.02] Hypoxia [R09.02] Acute on chronic diastolic congestive heart failure (HCC) [I50.33] Acute exacerbation of CHF (congestive heart failure) (HCC) [I50.9]   Discharge Diagnosis  Shortness of breath [R06.02] Hypoxia [R09.02] Acute on chronic diastolic congestive heart failure (HCC) [I50.33] Acute exacerbation of CHF (congestive heart failure) (HCC) [I50.9]    Principal Problem:   Acute respiratory failure with hypoxia (HCC) Active Problems:   Atrial fibrillation (HCC)   HTN (hypertension)   CAD (coronary artery disease)   Acute exacerbation of CHF (congestive heart failure) (HCC)   Reactive airway disease   AKI (acute kidney injury) ([place])   CHF (congestive heart failure) ([place])      Past Medical History:  Diagnosis Date  . Adenomatous colon polyp 2001  . Anxiety   . Atrial fibrillation ([place])    takes Coumadin daily  . BPH (benign prostatic hyperplasia)    takes  Proscar daily  . CAD (coronary artery disease) 2009   cardiac cath and PTCA by Dr. Tami Ribas  . Carpal tunnel syndrome    right  . Complication of anesthesia    hard to wake up  . Diverticulosis 2007   tcs by Dr. Laural Golden  . GERD (gastroesophageal reflux disease)   . Gout   . Hypercholesterolemia    takes Atorvastatin daily  . Hypertension    takes Imdur,Coreg,and Lisinopril daily  . Hypothyroidism    takes SYnthroid daily  . Joint pain   . Joint swelling   . Nocturia   . Numbness    right hand pointer and middle finger  . Osteoarthritis    left knee  . Pneumonia    many,many yrs ago  . Tubular adenoma 2001    Past Surgical History:  Procedure Laterality Date  . Barbie Banner OSTEOTOMY Right 03/17/2013   Procedure: Barbie Banner OSTEOTOMY;  Surgeon: Marcheta Grammes, DPM;  Location: [facility];  Service: Orthopedics;  Laterality: Right;  . BUNIONECTOMY Right 03/17/2013   Procedure: BUNIONECTOMY, Arthroplasty 2nd toe right foot;  Surgeon: Marcheta Grammes, DPM;  Location: [facility];  Service: Orthopedics;  Laterality: Right;  . CARDIAC CATHETERIZATION  06/11/2007  PTCA X2 in 06/2007:  2 overlapping Cypher stents to LAD (2.5x13 and 3.0x23)  . CARDIAC CATHETERIZATION  06/21/2007   Cypher stent 2.5 x 13  to OM branch  . CARDIOVERSION  12/12/2011   Procedure: CARDIOVERSION;  Surgeon: Sanda Klein, MD;  Location: [facility];  Service: Cardiovascular;  Laterality: N/A;  . CATARACT EXTRACTION     bilateral  . COLONOSCOPY  2007   Dr. Laural Golden- L sided diverticulosis. Next TCS 2012 due to h/o tubular adenoma.  . COLONOSCOPY  02/26/2011   Procedure: COLONOSCOPY;  Surgeon: Daneil Dolin, MD;  Location: [facility];  Service: Endoscopy;  Laterality: N/A;  11:05  . CORONARY ANGIOPLASTY     x 3   . FLEXOR TENOTOMY Left 03/17/2013   Procedure: PERCUTANEOUS FLEXOR TENOTOMY 3RD TOE LEFT FOOT;  Surgeon: Marcheta Grammes, DPM;  Location: [facility];  Service: Orthopedics;  Laterality: Left;  .  HERNIA REPAIR  2009   left inguinal  . rot Left   . TEE WITHOUT CARDIOVERSION  12/12/2011   Procedure: TRANSESOPHAGEAL ECHOCARDIOGRAM (TEE);  Surgeon: Sanda Klein, MD;  Location: [facility];  Service: Cardiovascular;  Laterality: N/A;   successful/sinus rhythm  . TOTAL KNEE ARTHROPLASTY Left 06/14/2013   Procedure: TOTAL KNEE ARTHROPLASTY;  Surgeon: Garald Balding, MD;  Location: [facility];  Service: Orthopedics;  Laterality: Left;     HPI  from the history and physical done on the day of admission:    PCP: Octavio Graves, DO Patient coming from: Home  Chief Complaint: Shortness of breath, cough  HPI: Jerry Cain is a 85 y.o. male with medical history significant of chronic diastolic congestive heart failure, hypertension, hypothyroidism, hyperlipidemia, GERD, CAD status post PCI, BPH, atrial fibrillation, anxiety presenting to the hospital for evaluation of shortness of breath and cough.  Patient reports coughing for the past 1 to 2 months.  Reports having dyspnea and wheezing for the past 3 days.  Reports having paroxysmal nocturnal dyspnea and chronic lower extremity edema.  States he has been told by his doctor to eat a lot of salt to help with his dizziness and blood pressure in the past.  He drinks 2 quarts of water in addition to other fluids every day.  States he has been taking Lasix 20 mg daily.  Denies having any chest pain, fevers, or chills.  States he is a never smoker.  Review of Systems: As per HPI otherwise 10 point review of systems negative.   Hospital Course:   Brief summary 85 y.o.malewith medical history significant ofchronic diastolic congestive heart failure,hypertension, hypothyroidism, hyperlipidemia, GERD, CAD status post PCI, BPH, atrial fibrillation, anxiety admitted on 04/30/2018 with acute hypoxic respiratory failure secondary to acute exacerbation of preserved EF CHF, patient had wheezing on admission most likely due to cardiac asthma, no  history of underlying significant pulmonary problems  Plan:- 1) acute hypoxic respiratory failure--- secondary to diastolic CHF exacerbation----initially patient required as much as 6 to 7 L of oxygen via nasal cannula, was weaned down,  currently on 2 to 3 L/min via nasal cannula, patient was not on oxygen prior to admission, discharged home on home oxygen with outpatient follow-up with PCP and cardiologist   2)HFpEF--- admitted with acute on chronic diastolic CHF exacerbation, recent EF from December 2019 was 60 to 65%, improved with IV diuresis,  fluid balance is negative over 7 L since admission, ----weight is down around 91 kg, oxygen requirement has improved significantly,   3)H/o CAD--- post prior PCI, no  chest pain, no ACS type symptoms , continue carvedilol, continue aspirin, continue Crestor--follow-up with cardiologist as outpatient further evaluation especially given new onset hypoxia  4)Permanent Atrial Fibrillation--- chads vascular score is 4, continue Coumadin, INR is therapeutic, continue carvedilol for rate control  5)Hypothyroidism--- stable, continue levothyroxine   Code Status : Full  Family Communication:   na   Disposition Plan  :  Home with home O2  Discharge Condition: stable  Follow UP--- PCP and cardiologist  Diet and Activity recommendation:  As advised  Discharge Instructions    Discharge Instructions    (HEART FAILURE PATIENTS) Call MD:  Anytime you have any of the following symptoms: 1) 3 pound weight gain in 24 hours or 5 pounds in 1 week 2) shortness of breath, with or without a dry hacking cough 3) swelling in the hands, feet or stomach 4) if you have to sleep on extra pillows at night in order to breathe.   Complete by:  As directed    Call MD for:  difficulty breathing, headache or visual disturbances   Complete by:  As directed    Call MD for:  persistant dizziness or light-headedness   Complete by:  As directed    Call MD for:   persistant nausea and vomiting   Complete by:  As directed    Call MD for:  severe uncontrolled pain   Complete by:  As directed    Call MD for:  temperature >100.4   Complete by:  As directed    Diet - low sodium heart healthy   Complete by:  As directed    Discharge instructions   Complete by:  As directed    1)Very low-salt diet advised 2)Weigh yourself daily, call if you gain more than 3 pounds in 1 day or more than 5 pounds in 1 week as your diuretic medications may need to be adjusted 3)recheck PT/INR, recheck CBC and BMP with PCP within a week 4) you are taking Coumadin which is a blood thinner so Avoid ibuprofen/Advil/Aleve/Motrin/Goody Powders/Naproxen/BC powders/Meloxicam/Diclofenac/Indomethacin and other Nonsteroidal anti-inflammatory medications as these will make you more likely to bleed and can cause stomach ulcers, can also cause Kidney problems.  5) use oxygen continuously via nasal cannula at 2 L/min   Increase activity slowly   Complete by:  As directed         Discharge Medications     Allergies as of 05/03/2018      Reactions   Carbapenems Other (See Comments)   Altered mental status   Cephalosporins    Unknown reaction   Penicillins Swelling, Other (See Comments)   Did it involve swelling of the face/tongue/throat, SOB, or low BP? Unknown-possible swelling Did it involve sudden or severe rash/hives, skin peeling, or any reaction on the inside of your mouth or nose? Unknown Did you need to seek medical attention at a hospital or doctor's office? Unknown When did it last happen?Over 10 years (possible swelling) If all above answers are "NO", may proceed with cephalosporin use.      Medication List    TAKE these medications   acetaminophen 325 MG tablet Commonly known as:  TYLENOL Take 2 tablets (650 mg total) by mouth every 6 (six) hours as needed for mild pain (or Fever >/= 101).   aspirin EC 81 MG tablet Take 1 tablet (81 mg total) by mouth  daily with breakfast. What changed:  when to take this   Calcium Carbonate-Vitamin D 600-200 MG-UNIT Tabs Take 1 tablet  by mouth 2 (two) times daily.   carvedilol 25 MG tablet Commonly known as:  COREG TAKE (1) TABLET BY MOUTH TWICE DAILY WITH A MEAL. What changed:  See the new instructions.   finasteride 5 MG tablet Commonly known as:  PROSCAR Take 5 mg by mouth every morning.   furosemide 20 MG tablet Commonly known as:  LASIX Take 1 tablet (20 mg total) by mouth every morning. What changed:  See the new instructions.   Glucosamine 750 MG Tabs Take 750 mg by mouth 2 (two) times daily.   isosorbide mononitrate 60 MG 24 hr tablet Commonly known as:  IMDUR TAKE ONE TABLET ONCE DAILY IN THE MORNING. What changed:  See the new instructions.   levothyroxine 150 MCG tablet Commonly known as:  SYNTHROID, LEVOTHROID Take 150 mcg by mouth every morning.   lisinopril 20 MG tablet Commonly known as:  PRINIVIL,ZESTRIL Take 1 tablet (20 mg total) by mouth daily. What changed:  See the new instructions.   multivitamin capsule Take 1 capsule by mouth every morning.   nitroGLYCERIN 0.4 MG SL tablet Commonly known as:  NITROSTAT DISSOLVE 1 TABLET UNDER TONGUE EVERY 5 MINUTES UP TO 15 MIN FOR CHESTPAIN. IF NO RELIEF CALL 911. What changed:  See the new instructions.   rosuvastatin 10 MG tablet Commonly known as:  CRESTOR TAKE (1) TABLET BY MOUTH ONCE A WEEK. What changed:  See the new instructions.   traMADol 50 MG tablet Commonly known as:  ULTRAM Take 50 mg by mouth every 6 (six) hours as needed for moderate pain or severe pain.   warfarin 5 MG tablet Commonly known as:  COUMADIN Take as directed. If you are unsure how to take this medication, talk to your nurse or doctor. Original instructions:  TAKE 1 TABLET BY MOUTH DAILY OR AS DIRECTED BY COUMADIN CLINIC. What changed:  See the new instructions.            Durable Medical Equipment  (From admission, onward)           Start     Ordered   05/03/18 1417  DME Oxygen  Once    Comments:  Oxygen via nasal cannula at 2 L/min continuously with gaseous portability and conserving device  Dx-- CHF--- congestive heart failure  Question Answer Comment  Mode or (Route) Nasal cannula   Liters per Minute 2   Frequency Continuous (stationary and portable oxygen unit needed)   Oxygen delivery system Gas      05/03/18 1417   05/03/18 1325  For home use only DME oxygen  Once    Question Answer Comment  Mode or (Route) Nasal cannula   Liters per Minute 2   Frequency Continuous (stationary and portable oxygen unit needed)   Oxygen conserving device Yes   Oxygen delivery system Gas      05/03/18 1324          Major procedures and Radiology Reports - PLEASE review detailed and final reports for all details, in brief -   Dg Chest 2 View  Result Date: 05/03/2018 CLINICAL DATA:  Shortness of breath. EXAM: CHEST - 2 VIEW COMPARISON:  Radiographs of April 30, 2018. FINDINGS: Stable cardiomediastinal silhouette. No pneumothorax or pleural effusion is noted. Both lungs are clear. The visualized skeletal structures are unremarkable. IMPRESSION: No active cardiopulmonary disease. Electronically Signed   By: Marijo Conception, M.D.   On: 05/03/2018 09:19   Dg Chest 2 View  Result Date: 04/30/2018 CLINICAL DATA:  Shortness of breath.  Cough and wheezing. EXAM: CHEST - 2 VIEW COMPARISON:  11/23/2014 FINDINGS: The heart is mildly enlarged. There is tortuosity and calcification of the thoracic aorta. Diffuse increased interstitial markings with thickened peripheral interseptal lines (Kerley B lines). Findings are most consistent with interstitial pulmonary edema. No definite pleural effusions or focal infiltrates. IMPRESSION: Findings suggest interstitial pulmonary edema. No pleural effusions and no focal infiltrates. Electronically Signed   By: Marijo Sanes M.D.   On: 04/30/2018 16:42    Micro Results   No results  found for this or any previous visit (from the past 240 hour(s)).     Today   Subjective    Jerry Cain today has no new complaints, shortness of breath and hypoxia continues to improve, no frank chest pains, no fevers, no pleuritic symptoms          Patient has been seen and examined prior to discharge   Objective   Blood pressure 124/73, pulse (!) 113, temperature 99.6 F (37.6 C), temperature source Oral, resp. rate 20, height 5\' 10"  (1.778 m), weight 91.6 kg, SpO2 95 %.   Intake/Output Summary (Last 24 hours) at 05/03/2018 1418 Last data filed at 05/03/2018 1300 Gross per 24 hour  Intake 1200 ml  Output 3150 ml  Net -1950 ml    Exam Gen:- Awake Alert, no acute distress HEENT:- [place].AT, No sclera icterus Neck-Supple Neck,No JVD,.  Nose- [place] 2 to 3  L/min Lungs-improving air movement, no wheezing no rhonchi CV- S1, S2 normal, irregularly irregular Abd-  +ve B.Sounds, Abd Soft, No tenderness,    Extremity/Skin:- Trace pitting edema, pedal pulses present  Psych-  affect is appropriate, oriented x3 Neuro-no new focal deficits, no tremors--   Data Review   CBC w Diff:  Lab Results  Component Value Date   WBC 11.9 (H) 05/03/2018   HGB 13.2 05/03/2018   HGB 13.1 08/27/2017   HCT 41.5 05/03/2018   HCT 38.3 08/27/2017   PLT 225 05/03/2018   PLT 183 08/27/2017   LYMPHOPCT 15 04/30/2018   MONOPCT 10 04/30/2018   EOSPCT 4 04/30/2018   BASOPCT 0 04/30/2018    CMP:  Lab Results  Component Value Date   NA 139 05/02/2018   NA 140 03/19/2018   K 3.3 (L) 05/02/2018   CL 102 05/02/2018   CO2 26 05/02/2018   BUN 33 (H) 05/02/2018   BUN 23 03/19/2018   CREATININE 1.01 05/02/2018   CREATININE 1.17 (H) 06/12/2016   PROT 6.8 08/27/2017   ALBUMIN 4.1 08/27/2017   BILITOT 1.0 08/27/2017   ALKPHOS 59 08/27/2017   AST 22 08/27/2017   ALT 22 08/27/2017  .   Total Discharge time is about 33 minutes  Roxan Hockey M.D on 05/03/2018 at 2:18 PM  Go to  [email or web address] -  for contact info  [facility] - Office  [phone]

## 2018-05-03 NOTE — Progress Notes (Signed)
[facility] delivered O2, instructions given to patient and family by [employer].

## 2018-05-03 NOTE — Care Management Note (Signed)
Case Management Note  Patient Details  Name: Jerry Cain MRN: [id number] Date of Birth: 1932-02-10  Subjective/Objective:        CHF. Independent.              Action/Plan: Qualifies for home oxygen. Elects [employer]. Referral called and faxed. Portable tank will be delivered to room prior to DC.  Expected Discharge Date:  05/03/18               Expected Discharge Plan:  Home/Self Care  In-House Referral:     Discharge planning Services  CM Consult  Post Acute Care Choice:  Durable Medical Equipment Choice offered to:  Patient  DME Arranged:  Oxygen DME Agency:  [facility]  HH Arranged:    [place] Agency:     Status of Service:  Completed, signed off  If discussed at [employer], dates discussed:    Additional Comments:  Isao Seltzer, Chauncey [place], RN 05/03/2018, 1:33 PM

## 2018-05-03 NOTE — Discharge Instructions (Signed)
1)Very low-salt diet advised 2)Weigh yourself daily, call if you gain more than 3 pounds in 1 day or more than 5 pounds in 1 week as your diuretic medications may need to be adjusted 3)Recheck PT/INR, recheck CBC and BMP with PCP within a week 4)You are taking Coumadin which is a blood thinner so Avoid ibuprofen/Advil/Aleve/Motrin/Goody Powders/Naproxen/BC powders/Meloxicam/Diclofenac/Indomethacin and other Nonsteroidal anti-inflammatory medications as these will make you more likely to bleed and can cause stomach ulcers, can also cause Kidney problems.  5) use oxygen continuously via nasal cannula at 2 L/min

## 2018-05-03 NOTE — Progress Notes (Addendum)
SATURATION QUALIFICATIONS: (This note is used to comply with regulatory documentation for home oxygen)  Patient Saturations on Room Air at Rest = 87%      Please briefly explain why patient needs home oxygen: 92% on 2 liters of oxygen at rest

## 2018-05-05 NOTE — Progress Notes (Signed)
Cardiology Office Note    Date:  05/06/2018   ID:  Wetzel, Meester [birth year], MRN [id number]  PCP:  Octavio Graves, DO  Cardiologist: Sanda Klein, MD    Chief Complaint  Patient presents with  . Hospitalization Follow-up    History of Present Illness:    Jerry Cain is a 85 y.o. male with past medical history of CAD (s/p PTCA and stenting of mid-LAD and LCx in 2009), PAF (on Coumadin), trifasicular block, chronic diastolic CHF, HTN, and HLD who presents to the office today for hospital follow-up.   He was last examined by Dr. Sallyanne Kuster in 03/2018 and reported being less active at baseline due to right knee pain and a nonhealing ulcer along his toes. Weight was up 7 lbs to 196 lbs and he reported associated dyspnea with lower extremity edema. He also reported increased sodium intake with consumption of soups. Given his progressive dyspnea, a Lexiscan Myoview was obtained to rule-out an ischemic etiology and showed findings consistent with prior inferior/apical myocardial infarction without current ischemia. EF was read as being reduced to 49% but an echocardiogram was also obtained and showed a preserved EF of 60-65%. Also noted to have mild MR, and moderately dilated LA and RA. Was recommended to increase Lasix to 20mg  daily until weight declined by 5 lbs then resume at 20mg  every other day. Per review of Dr. Victorino December note " I believe optimal range is around 192 lb. Avoid dropping under 190 lb (this will probably cause dizziness again) or increasing above 197 (will cause shortness of breath)". Follow-up had been scheduled for 04/13/2018 but the patient did not show for his appointment.   He was admitted to [facility] in the interim from 1/24 - 05/03/2018 for acute on chronic hypoxic respiratory failure secondary to CHF exacerbation as oxygen saturations were in the 80's upon admission. BNP was elevated to 345 and CXR showed interstitial pulmonary edema. He diuresed over 6 L  during admission and was transitioned to Lasix 20 mg daily upon discharge. A weight was not recorded on the day of discharge.   In talking with the patient, his wife, and his daughter today he reports that his breathing had overall been stable until last night. He has remained on 2 L nasal cannula since hospital discharge but says he developed acute worsening of his breathing around 0400. He denies any associated fever or chills. No recent chest pain, palpitations, or PND. Says that he did have a productive cough earlier this morning but this has since resolved and says that breathing has continued to improve throughout the day. Saturations are currently appropriate at 96%.  In reviewing medications with the patient, he has actually not taken Lasix since hospital discharge on Monday due to confusion about his current regimen. While weight is listed at 213 lbs  during the office today, he tells me his weight was 194 lbs on his home scales this morning. His current set of scales at home are 74+ years old.  Past Medical History:  Diagnosis Date  . Adenomatous colon polyp 2001  . Anxiety   . Atrial fibrillation ([place])    takes Coumadin daily  . BPH (benign prostatic hyperplasia)    takes Proscar daily  . CAD (coronary artery disease) 2009   cardiac cath and PTCA by Dr. Tami Ribas  . Carpal tunnel syndrome    right  . Complication of anesthesia    hard to wake up  . Diverticulosis 2007   tcs  by Dr. Laural Golden  . GERD (gastroesophageal reflux disease)   . Gout   . Hypercholesterolemia    takes Atorvastatin daily  . Hypertension    takes Imdur,Coreg,and Lisinopril daily  . Hypothyroidism    takes SYnthroid daily  . Joint pain   . Joint swelling   . Nocturia   . Numbness    right hand pointer and middle finger  . Osteoarthritis    left knee  . Pneumonia    many,many yrs ago  . Tubular adenoma 2001    Past Surgical History:  Procedure Laterality Date  . Barbie Banner OSTEOTOMY Right 03/17/2013    Procedure: Barbie Banner OSTEOTOMY;  Surgeon: Marcheta Grammes, DPM;  Location: [facility];  Service: Orthopedics;  Laterality: Right;  . BUNIONECTOMY Right 03/17/2013   Procedure: BUNIONECTOMY, Arthroplasty 2nd toe right foot;  Surgeon: Marcheta Grammes, DPM;  Location: [facility];  Service: Orthopedics;  Laterality: Right;  . CARDIAC CATHETERIZATION  06/11/2007   PTCA X2 in 06/2007:  2 overlapping Cypher stents to LAD (2.5x13 and 3.0x23)  . CARDIAC CATHETERIZATION  06/21/2007   Cypher stent 2.5 x 13  to OM branch  . CARDIOVERSION  12/12/2011   Procedure: CARDIOVERSION;  Surgeon: Sanda Klein, MD;  Location: [facility];  Service: Cardiovascular;  Laterality: N/A;  . CATARACT EXTRACTION     bilateral  . COLONOSCOPY  2007   Dr. Laural Golden- L sided diverticulosis. Next TCS 2012 due to h/o tubular adenoma.  . COLONOSCOPY  02/26/2011   Procedure: COLONOSCOPY;  Surgeon: Daneil Dolin, MD;  Location: [facility];  Service: Endoscopy;  Laterality: N/A;  11:05  . CORONARY ANGIOPLASTY     x 3   . FLEXOR TENOTOMY Left 03/17/2013   Procedure: PERCUTANEOUS FLEXOR TENOTOMY 3RD TOE LEFT FOOT;  Surgeon: Marcheta Grammes, DPM;  Location: [facility];  Service: Orthopedics;  Laterality: Left;  . HERNIA REPAIR  2009   left inguinal  . rot Left   . TEE WITHOUT CARDIOVERSION  12/12/2011   Procedure: TRANSESOPHAGEAL ECHOCARDIOGRAM (TEE);  Surgeon: Sanda Klein, MD;  Location: [facility] ENDOSCOPY;  Service: Cardiovascular;  Laterality: N/A;   successful/sinus rhythm  . TOTAL KNEE ARTHROPLASTY Left 06/14/2013   Procedure: TOTAL KNEE ARTHROPLASTY;  Surgeon: Garald Balding, MD;  Location: [place];  Service: Orthopedics;  Laterality: Left;    Current Medications: Outpatient Medications Prior to Visit  Medication Sig Dispense Refill  . aspirin EC 81 MG tablet Take 1 tablet (81 mg total) by mouth daily with breakfast. 30 tablet 1  . Calcium Carbonate-Vitamin D (CALCIUM + D) 600-200 MG-UNIT TABS Take 1 tablet by mouth 2  (two) times daily.     . carvedilol (COREG) 25 MG tablet TAKE (1) TABLET BY MOUTH TWICE DAILY WITH A MEAL. 180 tablet 2  . furosemide (LASIX) 20 MG tablet Take 1 tablet (20 mg total) by mouth every morning. 30 tablet 1  . Glucosamine 750 MG TABS Take 750 mg by mouth 2 (two) times daily.    . isosorbide mononitrate (IMDUR) 60 MG 24 hr tablet TAKE ONE TABLET ONCE DAILY IN THE MORNING. 90 tablet 3  . levothyroxine (SYNTHROID, LEVOTHROID) 150 MCG tablet Take 150 mcg by mouth every morning.    Marland Kitchen lisinopril (PRINIVIL,ZESTRIL) 20 MG tablet Take 1 tablet (20 mg total) by mouth daily. 30 tablet 10  . Multiple Vitamin (MULTIVITAMIN) capsule Take 1 capsule by mouth every morning.     . nitroGLYCERIN (NITROSTAT) 0.4 MG SL tablet DISSOLVE 1 TABLET UNDER TONGUE  EVERY 5 MINUTES UP TO [place]. IF NO RELIEF CALL 911. (Patient taking differently: Place 0.4 mg under the tongue every 5 (five) minutes as needed for chest pain. ) 25 tablet 6  . rosuvastatin (CRESTOR) 10 MG tablet TAKE (1) TABLET BY MOUTH ONCE A WEEK. (Patient taking differently: Take 10 mg by mouth every Sunday. ) 4 tablet 11  . warfarin (COUMADIN) 5 MG tablet TAKE 1 TABLET BY MOUTH DAILY OR AS DIRECTED BY COUMADIN CLINIC. (Patient taking differently: Take 2.5-5 mg by mouth See admin instructions. 2.5mg  on Tuesdays and Saturdays. Take 5mg  on all other days) 90 tablet 0  . acetaminophen (TYLENOL) 325 MG tablet Take 2 tablets (650 mg total) by mouth every 6 (six) hours as needed for mild pain (or Fever >/= 101). 20 tablet 1  . finasteride (PROSCAR) 5 MG tablet Take 5 mg by mouth every morning.     . traMADol (ULTRAM) 50 MG tablet Take 50 mg by mouth every 6 (six) hours as needed for moderate pain or severe pain.      No facility-administered medications prior to visit.      Allergies:   Carbapenems; Cephalosporins; and Penicillins   Social History   Socioeconomic History  . Marital status: Married    Spouse name: Not on file  . Number  of children: 4  . Years of education: Not on file  . Highest education level: Not on file  Occupational History  . Occupation: retired  Scientific laboratory technician  . Financial resource strain: Not on file  . Food insecurity:    Worry: Not on file    Inability: Not on file  . Transportation needs:    Medical: Not on file    Non-medical: Not on file  Tobacco Use  . Smoking status: Never Smoker  . Smokeless tobacco: Never Used  Substance and Sexual Activity  . Alcohol use: No  . Drug use: No  . Sexual activity: Not on file  Lifestyle  . Physical activity:    Days per week: Not on file    Minutes per session: Not on file  . Stress: Not on file  Relationships  . Social connections:    Talks on phone: Not on file    Gets together: Not on file    Attends religious service: Not on file    Active member of club or organization: Not on file    Attends meetings of clubs or organizations: Not on file    Relationship status: Not on file  Other Topics Concern  . Not on file  Social History Narrative  . Not on file     Family History:  The patient's family history includes Heart disease in his brother; Pneumonia in his father.   Review of Systems:   Please see the history of present illness.     General:  No chills, fever, night sweats or weight changes.  Cardiovascular:  No chest pain, orthopnea, palpitations, paroxysmal nocturnal dyspnea. Positive for dyspnea on exertion and edema.  Dermatological: No rash, lesions/masses Respiratory: No cough, dyspnea Urologic: No hematuria, dysuria Abdominal:   No nausea, vomiting, diarrhea, bright red blood per rectum, melena, or hematemesis Neurologic:  No visual changes, wkns, changes in mental status. All other systems reviewed and are otherwise negative except as noted above.   Physical Exam:    VS:  BP 126/60   Pulse 77   Ht 5\' 10"  (1.778 m)   Wt 213 lb (96.6 kg)   SpO2  96%   BMI 30.56 kg/m    General: Well developed, elderly Caucasian  male appearing in no acute distress. Head: Normocephalic, atraumatic, sclera non-icteric, no xanthomas, nares are without discharge.  Neck: No carotid bruits. JVD not elevated.  Lungs: Respirations regular and unlabored, rhonchi throughout upper lung fields with mild expiratory wheeze. On 2L [place].  Heart: Irregularly irregular. No S3 or S4.  No murmur, no rubs, or gallops appreciated. Abdomen: Soft, non-tender, non-distended with normoactive bowel sounds. No hepatomegaly. No rebound/guarding. No obvious abdominal masses. Msk:  Strength and tone appear normal for age. No joint deformities or effusions. Extremities: No clubbing or cyanosis. 1+ pitting edema bilaterally.  Distal pedal pulses are 2+ bilaterally. Neuro: Alert and oriented X 3. Moves all extremities spontaneously. No focal deficits noted. Psych:  Responds to questions appropriately with a normal affect. Skin: No rashes or lesions noted  Wt Readings from Last 3 Encounters:  05/06/18 213 lb (96.6 kg)  05/03/18 201 lb 15.1 oz (91.6 kg)  03/18/18 210 lb (95.3 kg)     Studies/Labs Reviewed:   EKG:  EKG is not ordered today.    Recent Labs: 08/27/2017: ALT 22 03/19/2018: NT-Pro BNP 1,087 04/30/2018: B Natriuretic Peptide 345.0 05/02/2018: BUN 33; Creatinine, Ser 1.01; Potassium 3.3; Sodium 139; TSH 5.484 05/03/2018: Hemoglobin 13.2; Platelets 225   Lipid Panel    Component Value Date/Time   CHOL 117 08/27/2017 0820   TRIG 83 08/27/2017 0820   HDL 34 (L) 08/27/2017 0820   CHOLHDL 3.4 08/27/2017 0820   CHOLHDL 3.2 06/12/2016 0929   VLDL 16 06/12/2016 0929   LDLCALC 66 08/27/2017 0820    Additional studies/ records that were reviewed today include:   Echocardiogram: 03/2018 Study Conclusions  - Left ventricle: The cavity size was normal. Wall thickness was   increased in a pattern of moderate LVH. Systolic function was   normal. The estimated ejection fraction was in the range of 60%   to 65%. Wall motion was normal;  there were no regional wall   motion abnormalities. The study is not technically sufficient to   allow evaluation of LV diastolic function. - Aortic valve: Moderately calcified annulus. Trileaflet;   moderately thickened leaflets. Valve area (VTI): 1.79 cm^2. Valve   area (Vmax): 1.79 cm^2. Valve area (Vmean): 1.76 cm^2. - Mitral valve: Mildly calcified annulus. Mildly thickened leaflets   . There was mild regurgitation. - Left atrium: The atrium was moderately dilated. - Right atrium: The atrium was moderately dilated.  NST: 03/2018  There was no ST segment deviation noted during stress.  Findings consistent with prior inferior/apical myocardial infarction without current ischemia  This is an intermediate risk study. Risk based on decreased LVEF, no current myocardium at jeopardy. Correlate LVEF with echo.  The left ventricular ejection fraction is mildly decreased (49%).  Assessment:    1. Acute on chronic diastolic heart failure ([place])   2. Coronary artery disease of native artery of native heart with stable angina pectoris (HCC)   3. Persistent atrial fibrillation   4. Essential hypertension   5. Hyperlipidemia LDL goal <70      Plan:   In order of problems listed above:  1. Acute on Chronic Diastolic CHF - recently admitted for acute hypoxic respiratory failure though to be secondary to CHF exacerbation. He was confused about his medications at discharge and has actually not taken Lasix since discharge. Recommended he take Lasix 20mg  today upon returning home and take 20mg  daily as recommended at the time  of discharge as he is volume overloaded by examination. I am concerned about the accuracy of his home scales as he was 194 lbs on his home scales this AM and at 213 lbs on our office scales. His daughter is going to let him borrow her scales to assess for accuracy. If volume status not improved within the next week, would anticipate titrating Lasix to 40mg  daily.  - he  reports a history of "asthma" and received Albuterol nebulizers during admission and per family's report was to be discharged with PRN use but this was not arranged. Given his rhonchi and wheezing on examination, I am concerned about the high likelihood for readmission. He is unable to see his PCP for several weeks so will provide with Albuterol nebulizer at this time and I encouraged close follow-up with his PCP. Might benefit from Pulmonology referral if respiratory status does not improve with further diuresis.   2. CAD - s/p PTCA and stenting of mid-LAD and LCx in 2009 with recent NST showing findings consistent with prior inferior/apical myocardial infarction without current ischemia.  - he has experienced progressive dyspnea in the setting of recent CHF exacerbation but denies any chest discomfort.  - continue ASA (has been on this along with Coumadin for anticoagulation), BB, Imdur, and statin therapy.   3. Persistent Atrial Fibrillation - he denies any recent palpitations and HR is well-controlled in the 70's during today's visit. Continue Coreg 25mg  BID for rate-control.  - he denies any evidence of active bleeding. Hgb stable at 13.2 when checked on 05/03/2018. Continue Coumadin for anticoagulation.    4. HTN - BP is well-controlled at 126/60 during today's visit.  - continue Coreg, Imdur, and Lisinopril at current dosing.   5. HLD - FLP in 08/2017 showed total cholesterol of 117, HDL 34, and LDL 66. At goal of LDL < 70. Remains on Crestor 10mg  once weekly.    Medication Adjustments/Labs and Tests Ordered: Current medicines are reviewed at length with the patient today.  Concerns regarding medicines are outlined above.  Medication changes, Labs and Tests ordered today are listed in the Patient Instructions below. Patient Instructions  Medication Instructions:  Your physician recommends that you continue on your current medications as directed. Please refer to the Current Medication  list given to you today.  Restart Lasix 20 Daily  Albuterol neb every 6 hours as needed   If you need a refill on your cardiac medications before your next appointment, please call your pharmacy.   Lab work: NONE  If you have labs (blood work) drawn today and your tests are completely normal, you will receive your results only by: Marland Kitchen MyChart Message (if you have MyChart) OR . A paper copy in the mail If you have any lab test that is abnormal or we need to change your treatment, we will call you to review the results.  Testing/Procedures: NONE   Follow-Up: At [facility], you and your health needs are our priority.  As part of our continuing mission to provide you with exceptional heart care, we have created designated Provider Care Teams.  These Care Teams include your primary Cardiologist (physician) and Advanced Practice Providers (APPs -  Physician Assistants and Nurse Practitioners) who all work together to provide you with the care you need, when you need it. You will need a follow up appointment in 3-4 weeks.  Please call our office 2 months in advance to schedule this appointment.  You may see Sanda Klein, MD or one  of the following Advanced Practice Providers on your designated Care Team:   [place], PA-C [facility]) . Ermalinda Barrios, PA-C ([place])  Any Other Special Instructions Will Be Listed Below (If Applicable). Thank you for choosing [facility]!     Signed, Erma Heritage, PA-C  05/06/2018 8:34 PM    [facility] [address] [place], [place] [address] Phone: [phone] Fax: [phone]

## 2018-05-06 ENCOUNTER — Ambulatory Visit: Payer: Medicare Other | Admitting: Student

## 2018-05-06 ENCOUNTER — Encounter: Payer: Self-pay | Admitting: Student

## 2018-05-06 VITALS — BP 126/60 | HR 77 | Ht 70.0 in | Wt 213.0 lb

## 2018-05-06 DIAGNOSIS — I25118 Atherosclerotic heart disease of native coronary artery with other forms of angina pectoris: Secondary | ICD-10-CM

## 2018-05-06 DIAGNOSIS — E785 Hyperlipidemia, unspecified: Secondary | ICD-10-CM

## 2018-05-06 DIAGNOSIS — I4819 Other persistent atrial fibrillation: Secondary | ICD-10-CM | POA: Diagnosis not present

## 2018-05-06 DIAGNOSIS — I5033 Acute on chronic diastolic (congestive) heart failure: Secondary | ICD-10-CM | POA: Diagnosis not present

## 2018-05-06 DIAGNOSIS — I10 Essential (primary) hypertension: Secondary | ICD-10-CM

## 2018-05-06 MED ORDER — ALBUTEROL SULFATE (2.5 MG/3ML) 0.083% IN NEBU: 2.5000 mg | INHALATION_SOLUTION | Freq: Four times a day (QID) | RESPIRATORY_TRACT | 12 refills | Status: DC | PRN

## 2018-05-06 MED ORDER — NEBULIZER MISC: 1.0000 [IU] | Freq: Every day | 0 refills | Status: AC

## 2018-05-06 NOTE — Patient Instructions (Signed)
Medication Instructions:  Your physician recommends that you continue on your current medications as directed. Please refer to the Current Medication list given to you today.  Restart Lasix 20 Daily  Albuterol neb every 6 hours as needed   If you need a refill on your cardiac medications before your next appointment, please call your pharmacy.   Lab work: NONE  If you have labs (blood work) drawn today and your tests are completely normal, you will receive your results only by: Marland Kitchen MyChart Message (if you have MyChart) OR . A paper copy in the mail If you have any lab test that is abnormal or we need to change your treatment, we will call you to review the results.  Testing/Procedures: NONE   Follow-Up: At [facility], you and your health needs are our priority.  As part of our continuing mission to provide you with exceptional heart care, we have created designated Provider Care Teams.  These Care Teams include your primary Cardiologist (physician) and Advanced Practice Providers (APPs -  Physician Assistants and Nurse Practitioners) who all work together to provide you with the care you need, when you need it. You will need a follow up appointment in 3-4 weeks.  Please call our office 2 months in advance to schedule this appointment.  You may see Sanda Klein, MD or one of the following Advanced Practice Providers on your designated Care Team:   [place], PA-C [facility]) . Ermalinda Barrios, PA-C ([place])  Any Other Special Instructions Will Be Listed Below (If Applicable). Thank you for choosing [facility]!

## 2018-05-07 NOTE — Progress Notes (Signed)
Thanks, [name]. He used to be very active and fit. Decline over last couple of years. If we can't figure him out, might need a R and L cath despite the absence of ischemia on the nuke. MCr

## 2018-05-08 ENCOUNTER — Other Ambulatory Visit: Payer: Self-pay | Admitting: Cardiovascular Disease

## 2018-05-14 ENCOUNTER — Telehealth: Payer: Self-pay | Admitting: Cardiology

## 2018-05-14 NOTE — Telephone Encounter (Signed)
    BP was 126/60 during his office visit today and given his variable readings this morning, I suspect part of this was due to him checking the blood pressure so frequently. Would recommend checking a maximum of twice daily. Please keep a record and bring to his follow-up visit in the next few weeks. Would not adjust medications at this time given that he is asymptomatic. Will review records at the time of his visit.  Signed, Erma Heritage, PA-C 05/14/2018, 2:52 PM Pager: [phone]

## 2018-05-14 NOTE — Telephone Encounter (Signed)
Called pt. No answer, left message for pt to return call.  

## 2018-05-14 NOTE — Telephone Encounter (Signed)
Returned pt call. He states that his blood pressure has been running low at times. He started reading his number off and I realized he takes huis blood pressure back to back and many times. He had taken his blood pressure 9 times this morning before 11:00. Some of his readings were 103/62, 93/63, 113/63, 115/73, 108/68. He is asymptomatic, but noticed the numbers kept staying lower than normal. He was asking if he should reduce his bp medication. Please advise.

## 2018-05-14 NOTE — Telephone Encounter (Signed)
Patient would like to speak with nurse regarding BP readings and them being low. / tg

## 2018-05-14 NOTE — Telephone Encounter (Signed)
Pt made aware, voiced understanding. 

## 2018-05-17 ENCOUNTER — Telehealth: Payer: Self-pay | Admitting: Cardiology

## 2018-05-17 NOTE — Telephone Encounter (Signed)
Left message on vm that Jerry Cain left a vm msg originally but did make contact with him after leaving that message giving him details of the encounter.

## 2018-05-17 NOTE — Telephone Encounter (Signed)
Returning call from [place] from Friday 2/7//tg

## 2018-05-20 ENCOUNTER — Telehealth: Payer: Self-pay | Admitting: Cardiology

## 2018-05-20 NOTE — Telephone Encounter (Signed)
error 

## 2018-05-27 ENCOUNTER — Ambulatory Visit: Payer: Medicare Other | Admitting: Student

## 2018-05-27 ENCOUNTER — Ambulatory Visit (INDEPENDENT_AMBULATORY_CARE_PROVIDER_SITE_OTHER): Payer: Medicare Other | Admitting: *Deleted

## 2018-05-27 ENCOUNTER — Encounter: Payer: Self-pay | Admitting: Student

## 2018-05-27 VITALS — BP 118/68 | HR 84 | Ht 70.0 in | Wt 207.0 lb

## 2018-05-27 DIAGNOSIS — G4730 Sleep apnea, unspecified: Secondary | ICD-10-CM

## 2018-05-27 DIAGNOSIS — Z5181 Encounter for therapeutic drug level monitoring: Secondary | ICD-10-CM

## 2018-05-27 DIAGNOSIS — I4819 Other persistent atrial fibrillation: Secondary | ICD-10-CM

## 2018-05-27 DIAGNOSIS — I4891 Unspecified atrial fibrillation: Secondary | ICD-10-CM | POA: Diagnosis not present

## 2018-05-27 DIAGNOSIS — Z7901 Long term (current) use of anticoagulants: Secondary | ICD-10-CM | POA: Diagnosis not present

## 2018-05-27 DIAGNOSIS — I10 Essential (primary) hypertension: Secondary | ICD-10-CM | POA: Diagnosis not present

## 2018-05-27 DIAGNOSIS — I25118 Atherosclerotic heart disease of native coronary artery with other forms of angina pectoris: Secondary | ICD-10-CM

## 2018-05-27 DIAGNOSIS — I5032 Chronic diastolic (congestive) heart failure: Secondary | ICD-10-CM | POA: Diagnosis not present

## 2018-05-27 DIAGNOSIS — R0683 Snoring: Secondary | ICD-10-CM

## 2018-05-27 DIAGNOSIS — I480 Paroxysmal atrial fibrillation: Secondary | ICD-10-CM

## 2018-05-27 DIAGNOSIS — Z79899 Other long term (current) drug therapy: Secondary | ICD-10-CM

## 2018-05-27 DIAGNOSIS — E785 Hyperlipidemia, unspecified: Secondary | ICD-10-CM

## 2018-05-27 LAB — POCT INR: INR: 2.7 (ref 2.0–3.0)

## 2018-05-27 NOTE — Patient Instructions (Addendum)
Limit daily fluid intake to less than 2 Liters per day. Please limit salt intake.  Please weight yourself every morning. Take an extra Lasix tablet  Two Gram Sodium Diet 2000 mg  What is Sodium? Sodium is a mineral found naturally in many foods. The most significant source of sodium in the diet is table salt, which is about 40% sodium.  Processed, convenience, and preserved foods also contain a large amount of sodium.  The body needs only 500 mg of sodium daily to function,  A normal diet provides more than enough sodium even if you do not use salt.  Why Limit Sodium? A build up of sodium in the body can cause thirst, increased blood pressure, shortness of breath, and water retention.  Decreasing sodium in the diet can reduce edema and risk of heart attack or stroke associated with high blood pressure.  Keep in mind that there are many other factors involved in these health problems.  Heredity, obesity, lack of exercise, cigarette smoking, stress and what you eat all play a role.  General Guidelines:  Do not add salt at the table or in cooking.  One teaspoon of salt contains over 2 grams of sodium.  Read food labels  Avoid processed and convenience foods  Ask your dietitian before eating any foods not dicussed in the menu planning guidelines  Consult your physician if you wish to use a salt substitute or a sodium containing medication such as antacids.  Limit milk and milk products to 16 oz (2 cups) per day.  Shopping Hints:  READ LABELS!! "Dietetic" does not necessarily mean low sodium.  Salt and other sodium ingredients are often added to foods during processing.   Menu Planning Guidelines Food Group Choose More Often Avoid  Beverages (see also the milk group All fruit juices, low-sodium, salt-free vegetables juices, low-sodium carbonated beverages Regular vegetable or tomato juices, commercially softened water used for drinking or cooking  Breads and Cereals Enriched white, wheat,  rye and pumpernickel bread, hard rolls and dinner rolls; muffins, cornbread and waffles; most dry cereals, cooked cereal without added salt; unsalted crackers and breadsticks; low sodium or homemade bread crumbs Bread, rolls and crackers with salted tops; quick breads; instant hot cereals; pancakes; commercial bread stuffing; self-rising flower and biscuit mixes; regular bread crumbs or cracker crumbs  Desserts and Sweets Desserts and sweets mad with mild should be within allowance Instant pudding mixes and cake mixes  Fats Butter or margarine; vegetable oils; unsalted salad dressings, regular salad dressings limited to 1 Tbs; light, sour and heavy cream Regular salad dressings containing bacon fat, bacon bits, and salt pork; snack dips made with instant soup mixes or processed cheese; salted nuts  Fruits Most fresh, frozen and canned fruits Fruits processed with salt or sodium-containing ingredient (some dried fruits are processed with sodium sulfites        Vegetables Fresh, frozen vegetables and low- sodium canned vegetables Regular canned vegetables, sauerkraut, pickled vegetables, and others prepared in brine; frozen vegetables in sauces; vegetables seasoned with ham, bacon or salt pork  Condiments, Sauces, Miscellaneous  Salt substitute with physician's approval; pepper, herbs, spices; vinegar, lemon or lime juice; hot pepper sauce; garlic powder, onion powder, low sodium soy sauce (1 Tbs.); low sodium condiments (ketchup, chili sauce, mustard) in limited amounts (1 tsp.) fresh ground horseradish; unsalted tortilla chips, pretzels, potato chips, popcorn, salsa (1/4 cup) Any seasoning made with salt including garlic salt, celery salt, onion salt, and seasoned salt; sea salt, rock salt, kosher  salt; meat tenderizers; monosodium glutamate; mustard, regular soy sauce, barbecue, sauce, chili sauce, teriyaki sauce, steak sauce, Worcestershire sauce, and most flavored vinegars; canned gravy and mixes;  regular condiments; salted snack foods, olives, picles, relish, horseradish sauce, catsup   Food preparation: Try these seasonings Meats:    Pork Sage, onion Serve with applesauce  Chicken Poultry seasoning, thyme, parsley Serve with cranberry sauce  Lamb Curry powder, rosemary, garlic, thyme Serve with mint sauce or jelly  Veal Marjoram, basil Serve with current jelly, cranberry sauce  Beef Pepper, bay leaf Serve with dry mustard, unsalted chive butter  Fish [place] leaf, dill Serve with unsalted lemon butter, unsalted parsley butter  Vegetables:    Asparagus Lemon juice   Broccoli Lemon juice   Carrots Mustard dressing parsley, mint, nutmeg, glazed with unsalted butter and sugar   Green beans Marjoram, lemon juice, nutmeg,dill seed   Tomatoes Basil, marjoram, onion   Spice /blend for [employer]" 4 tsp ground thyme 1 tsp ground sage 3 tsp ground rosemary 4 tsp ground marjoram   Test your knowledge 1. A product that says "Salt Free" may still contain sodium. True or False 2. Garlic Powder and Hot Pepper Sauce an be used as alternative seasonings.True or False 3. Processed foods have more sodium than fresh foods.  True or False 4. Canned Vegetables have less sodium than froze True or False  WAYS TO DECREASE YOUR SODIUM INTAKE 1. Avoid the use of added salt in cooking and at the table.  Table salt (and other prepared seasonings which contain salt) is probably one of the greatest sources of sodium in the diet.  Unsalted foods can gain flavor from the sweet, sour, and butter taste sensations of herbs and spices.  Instead of using salt for seasoning, try the following seasonings with the foods listed.  Remember: how you use them to enhance natural food flavors is limited only by your creativity... Allspice-Meat, fish, eggs, fruit, peas, red and yellow vegetables Almond Extract-Fruit baked goods Anise Seed-Sweet breads, fruit, carrots, beets, cottage cheese, cookies (tastes like  licorice) Basil-Meat, fish, eggs, vegetables, rice, vegetables salads, soups, sauces Bay Leaf-Meat, fish, stews, poultry Burnet-Salad, vegetables (cucumber-like flavor) Caraway Seed-Bread, cookies, cottage cheese, meat, vegetables, cheese, rice Cardamon-Baked goods, fruit, soups Celery Powder or seed-Salads, salad dressings, sauces, meatloaf, soup, bread.Do not use  celery salt Chervil-Meats, salads, fish, eggs, vegetables, cottage cheese (parsley-like flavor) Chili Power-Meatloaf, chicken cheese, corn, eggplant, egg dishes Chives-Salads cottage cheese, egg dishes, soups, vegetables, sauces Cilantro-Salsa, casseroles Cinnamon-Baked goods, fruit, pork, lamb, chicken, carrots Cloves-Fruit, baked goods, fish, pot roast, green beans, beets, carrots Coriander-Pastry, cookies, meat, salads, cheese (lemon-orange flavor) Cumin-Meatloaf, fish,cheese, eggs, cabbage,fruit pie (caraway flavor) [employer], fruit, eggs, fish, poultry, cottage cheese, vegetables Dill Seed-Meat, cottage cheese, poultry, vegetables, fish, salads, bread Fennel Seed-Bread, cookies, apples, pork, eggs, fish, beets, cabbage, cheese, Licorice-like flavor Garlic-(buds or powder) Salads, meat, poultry, fish, bread, butter, vegetables, potatoes.Do not  use garlic salt Ginger-Fruit, vegetables, baked goods, meat, fish, poultry Horseradish Root-Meet, vegetables, butter Lemon Juice or Extract-Vegetables, fruit, tea, baked goods, fish salads Mace-Baked goods fruit, vegetables, fish, poultry (taste like nutmeg) Maple Extract-Syrups Marjoram-Meat, chicken, fish, vegetables, breads, green salads (taste like Sage) Mint-Tea, lamb, sherbet, vegetables, desserts, carrots, cabbage Mustard, Dry or Seed-Cheese, eggs, meats, vegetables, poultry Nutmeg-Baked goods, fruit, chicken, eggs, vegetables, desserts Onion Powder-Meat, fish, poultry, vegetables, cheese, eggs, bread, rice salads (Do not use   Onion salt) Orange Extract-Desserts,  baked goods Oregano-Pasta, eggs, cheese, onions, pork, lamb, fish, chicken, vegetables,  green salads Paprika-Meat, fish, poultry, eggs, cheese, vegetables Parsley Flakes-Butter, vegetables, meat fish, poultry, eggs, bread, salads (certain forms may   Contain sodium Pepper-Meat fish, poultry, vegetables, eggs Peppermint Extract-Desserts, baked goods Poppy Seed-Eggs, bread, cheese, fruit dressings, baked goods, noodles, vegetables, cottage  [employer], poultry, meat, fish, cauliflower, turnips,eggs bread Saffron-Rice, bread, veal, chicken, fish, eggs Sage-Meat, fish, poultry, onions, eggplant, tomateos, pork, stews Savory-Eggs, salads, poultry, meat, rice, vegetables, soups, pork Tarragon-Meat, poultry, fish, eggs, butter, vegetables (licorice-like flavor)  Thyme-Meat, poultry, fish, eggs, vegetables, (clover-like flavor), sauces, soups Tumeric-Salads, butter, eggs, fish, rice, vegetables (saffron-like flavor) Vanilla Extract-Baked goods, candy Vinegar-Salads, vegetables, meat marinades Walnut Extract-baked goods, candy  2. Choose your Foods Wisely   The following is a list of foods to avoid which are high in sodium:  Meats-Avoid all smoked, canned, salt cured, dried and kosher meat and fish as well as Anchovies   [employer] meats:Bologna, Liverwurst, Pastrami Canned meat or fish  Marinated herring Caviar    Pepperoni Corned Beef   Pizza Dried chipped beef  Salami Frozen breaded fish or meat Salt pork Frankfurters or hot dogs  Sardines Gefilte fish   Sausage Ham (boiled ham, Proscuitto Smoked butt    spiced ham)   Spam      TV Dinners Vegetables Canned vegetables (Regular) Relish Canned mushrooms  Sauerkraut Olives    Tomato juice Pickles  Bakery and Dessert Products Canned puddings  Cream pies Cheesecake   Decorated cakes Cookies  Beverages/Juices Tomato juice, regular  Gatorade   V-8 vegetable juice,  regular  Breads and Cereals Biscuit mixes   Salted potato chips, corn chips, pretzels Bread stuffing mixes  Salted crackers and rolls Pancake and waffle mixes Self-rising flour  Seasonings Accent    Meat sauces Barbecue sauce  Meat tenderizer Catsup    Monosodium glutamate (MSG) Celery salt   Onion salt Chili sauce   Prepared mustard Garlic salt   Salt, seasoned salt, sea salt Gravy mixes   Soy sauce Horseradish   Steak sauce Ketchup   Tartar sauce Lite salt    Teriyaki sauce Marinade mixes   Worcestershire sauce Medication Instructions:  Your physician recommends that you continue on your current medications as directed. Please refer to the Current Medication list given to you today.  If you need a refill on your cardiac medications before your next appointment, please call your pharmacy.   Lab work: Your physician recommends that you return for lab work this week.  If you have labs (blood work) drawn today and your tests are completely normal, you will receive your results only by: Marland Kitchen MyChart Message (if you have MyChart) OR . A paper copy in the mail If you have any lab test that is abnormal or we need to change your treatment, we will call you to review the results.  Testing/Procedures: Sleep Study   Follow-Up: At [facility], you and your health needs are our priority.  As part of our continuing mission to provide you with exceptional heart care, we have created designated Provider Care Teams.  These Care Teams include your primary Cardiologist (physician) and Advanced Practice Providers (APPs -  Physician Assistants and Nurse Practitioners) who all work together to provide you with the care you need, when you need it. You will need a follow up appointment in 2-3 months.  Please call our office 2 months in advance to schedule this appointment.  You may see Sanda Klein, MD or one of the  following Advanced Practice Providers on your designated Care Team:   [place], PA-C [facility]) . Ermalinda Barrios, PA-C ([place])  Any Other Special Instructions Will Be Listed Below (If Applicable). Thank you for choosing [place]!      Others Baking powder   Cocoa and cocoa mixes Baking soda   Commercial casserole mixes Candy-caramels, chocolate  Dehydrated soups    Bars, fudge,nougats  Instant rice and pasta mixes Canned broth or soup  Maraschino cherries Cheese, aged and processed cheese and cheese spreads

## 2018-05-27 NOTE — Patient Instructions (Signed)
Continue coumadin 1 tablet daily except 1/2 tablet on Tuesdays and Saturdays.  Continue greens. Recheck in 4 weeks.

## 2018-05-27 NOTE — Progress Notes (Signed)
Cardiology Office Note    Date:  05/27/2018   ID:  Jerry Cain, Jerry Cain July 21, 1931, MRN [id number]  PCP:  Octavio Graves, DO  Cardiologist: Sanda Klein, MD    Chief Complaint  Patient presents with  . Follow-up    3 week visit    History of Present Illness:    Jerry Cain is a 85 y.o. male with past medical history of CAD (s/p PTCA and stenting of mid-LAD and LCx in 2009), PAF (on Coumadin), trifasicular block, chronic diastolic CHF, HTN, and HLD who presents to the office today for 3-week follow-up.  He was last examined by myself on 05/06/2018 following a recent hospitalization for an acute CHF exacerbation. He reported still having intermittent episodes of dyspnea at rest and with activity at that time. Had been discharged on 2L nasal cannula and saturations had remained appropriate in the 90's. He had forgotten to take Lasix following hospital discharge and weight had trended upwards on his home scales, therefore Lasix 20 mg daily was restarted at the time of his office visit and he was provided with an Rx for nebulizer treatments given his wheezing on examination.  In talking with the patient and his wife, he reports a significant improvement in his symptoms since his last office visit. Feels "the best he has in several months". Says his breathing significantly improved and he was actually able to stop using supplemental oxygen last week at the instruction of his PCP. He denies any recurrent dyspnea at rest or with exertion. No recent chest pain, palpitations, orthopnea, or PND. He does still experience intermittent lower extremity edema but says this has improved with him reducing his fluid intake at home. Weight has continued to decline on his home scales, now at 201 lbs. Weights at home were previously in the 190's on his home scales in 03/2018 but he purchased new scales in the interim.   His wife does report he was snoring less while using supplemental oxygen. She  remains concerned about his breathing at night as it frequently sounds like he stops breathing multiple times per her report.   Past Medical History:  Diagnosis Date  . Adenomatous colon polyp 2001  . Anxiety   . Atrial fibrillation ([place])    takes Coumadin daily  . BPH (benign prostatic hyperplasia)    takes Proscar daily  . CAD (coronary artery disease) 2009   cardiac cath and PTCA by Dr. Tami Ribas  . Carpal tunnel syndrome    right  . Complication of anesthesia    hard to wake up  . Diverticulosis 2007   tcs by Dr. Laural Golden  . GERD (gastroesophageal reflux disease)   . Gout   . Hypercholesterolemia    takes Atorvastatin daily  . Hypertension    takes Imdur,Coreg,and Lisinopril daily  . Hypothyroidism    takes SYnthroid daily  . Joint pain   . Joint swelling   . Nocturia   . Numbness    right hand pointer and middle finger  . Osteoarthritis    left knee  . Pneumonia    many,many yrs ago  . Tubular adenoma 2001    Past Surgical History:  Procedure Laterality Date  . Barbie Banner OSTEOTOMY Right 03/17/2013   Procedure: Barbie Banner OSTEOTOMY;  Surgeon: Marcheta Grammes, DPM;  Location: [facility];  Service: Orthopedics;  Laterality: Right;  . BUNIONECTOMY Right 03/17/2013   Procedure: BUNIONECTOMY, Arthroplasty 2nd toe right foot;  Surgeon: Marcheta Grammes, DPM;  Location: [facility];  Service: Orthopedics;  Laterality: Right;  . CARDIAC CATHETERIZATION  06/11/2007   PTCA X2 in 06/2007:  2 overlapping Cypher stents to LAD (2.5x13 and 3.0x23)  . CARDIAC CATHETERIZATION  06/21/2007   Cypher stent 2.5 x 13  to OM branch  . CARDIOVERSION  12/12/2011   Procedure: CARDIOVERSION;  Surgeon: Sanda Klein, MD;  Location: [facility];  Service: Cardiovascular;  Laterality: N/A;  . CATARACT EXTRACTION     bilateral  . COLONOSCOPY  2007   Dr. Laural Golden- L sided diverticulosis. Next TCS 2012 due to h/o tubular adenoma.  . COLONOSCOPY  02/26/2011   Procedure: COLONOSCOPY;  Surgeon: Daneil Dolin, MD;  Location: [facility];  Service: Endoscopy;  Laterality: N/A;  11:05  . CORONARY ANGIOPLASTY     x 3   . FLEXOR TENOTOMY Left 03/17/2013   Procedure: PERCUTANEOUS FLEXOR TENOTOMY 3RD TOE LEFT FOOT;  Surgeon: Marcheta Grammes, DPM;  Location: [facility];  Service: Orthopedics;  Laterality: Left;  . HERNIA REPAIR  2009   left inguinal  . rot Left   . TEE WITHOUT CARDIOVERSION  12/12/2011   Procedure: TRANSESOPHAGEAL ECHOCARDIOGRAM (TEE);  Surgeon: Sanda Klein, MD;  Location: [facility];  Service: Cardiovascular;  Laterality: N/A;   successful/sinus rhythm  . TOTAL KNEE ARTHROPLASTY Left 06/14/2013   Procedure: TOTAL KNEE ARTHROPLASTY;  Surgeon: Garald Balding, MD;  Location: [facility];  Service: Orthopedics;  Laterality: Left;    Current Medications: Outpatient Medications Prior to Visit  Medication Sig Dispense Refill  . albuterol (PROVENTIL) (2.5 MG/3ML) 0.083% nebulizer solution Take 3 mLs (2.5 mg total) by nebulization every 6 (six) hours as needed for wheezing or shortness of breath. 75 mL 12  . aspirin EC 81 MG tablet Take 1 tablet (81 mg total) by mouth daily with breakfast. 30 tablet 1  . Calcium Carbonate-Vitamin D (CALCIUM + D) 600-200 MG-UNIT TABS Take 1 tablet by mouth 2 (two) times daily.     . carvedilol (COREG) 25 MG tablet TAKE (1) TABLET BY MOUTH TWICE DAILY WITH A MEAL. 180 tablet 2  . furosemide (LASIX) 20 MG tablet Take 1 tablet (20 mg total) by mouth every morning. 30 tablet 1  . Glucosamine 750 MG TABS Take 750 mg by mouth 2 (two) times daily.    . isosorbide mononitrate (IMDUR) 60 MG 24 hr tablet TAKE ONE TABLET ONCE DAILY IN THE MORNING. 90 tablet 3  . levothyroxine (SYNTHROID, LEVOTHROID) 150 MCG tablet Take 150 mcg by mouth every morning.    Marland Kitchen lisinopril (PRINIVIL,ZESTRIL) 20 MG tablet Take 1 tablet (20 mg total) by mouth daily. 30 tablet 10  . Multiple Vitamin (MULTIVITAMIN) capsule Take 1 capsule by mouth every morning.     . Nebulizer MISC 1  Units by Does not apply route daily. 1 each 0  . nitroGLYCERIN (NITROSTAT) 0.4 MG SL tablet DISSOLVE 1 TABLET UNDER TONGUE EVERY 5 MINUTES UP TO 15 MIN FOR CHESTPAIN. IF NO RELIEF CALL 911. (Patient taking differently: Place 0.4 mg under the tongue every 5 (five) minutes as needed for chest pain. ) 25 tablet 6  . rosuvastatin (CRESTOR) 10 MG tablet TAKE (1) TABLET BY MOUTH ONCE A WEEK. (Patient taking differently: Take 10 mg by mouth every Sunday. ) 4 tablet 11  . warfarin (COUMADIN) 5 MG tablet TAKE 1 TABLET BY MOUTH DAILY OR AS DIRECTED BY COUMADIN CLINIC. 90 tablet 0   No facility-administered medications prior to visit.      Allergies:   Carbapenems; Cephalosporins;  and Penicillins   Social History   Socioeconomic History  . Marital status: Married    Spouse name: Not on file  . Number of children: 4  . Years of education: Not on file  . Highest education level: Not on file  Occupational History  . Occupation: retired  Scientific laboratory technician  . Financial resource strain: Not on file  . Food insecurity:    Worry: Not on file    Inability: Not on file  . Transportation needs:    Medical: Not on file    Non-medical: Not on file  Tobacco Use  . Smoking status: Never Smoker  . Smokeless tobacco: Never Used  Substance and Sexual Activity  . Alcohol use: No  . Drug use: No  . Sexual activity: Not on file  Lifestyle  . Physical activity:    Days per week: Not on file    Minutes per session: Not on file  . Stress: Not on file  Relationships  . Social connections:    Talks on phone: Not on file    Gets together: Not on file    Attends religious service: Not on file    Active member of club or organization: Not on file    Attends meetings of clubs or organizations: Not on file    Relationship status: Not on file  Other Topics Concern  . Not on file  Social History Narrative  . Not on file     Family History:  The patient's family history includes Heart disease in his brother;  Pneumonia in his father.   Review of Systems:   Please see the history of present illness.     General:  No chills, fever, night sweats or weight changes.  Cardiovascular:  No chest pain, dyspnea on exertion, orthopnea, palpitations, paroxysmal nocturnal dyspnea. Positive for lower extremity edema.  Dermatological: No rash, lesions/masses Respiratory: No cough, dyspnea Urologic: No hematuria, dysuria Abdominal:   No nausea, vomiting, diarrhea, bright red blood per rectum, melena, or hematemesis Neurologic:  No visual changes, wkns, changes in mental status. All other systems reviewed and are otherwise negative except as noted above.   Physical Exam:    VS:  BP 118/68 (BP Location: Right Arm)   Pulse 84   Ht 5\' 10"  (1.778 m)   Wt 207 lb (93.9 kg)   SpO2 94%   BMI 29.70 kg/m    General: Well developed, elderly Caucasian male appearing in no acute distress. Head: Normocephalic, atraumatic, sclera non-icteric, no xanthomas, nares are without discharge.  Neck: No carotid bruits. JVD not elevated.  Lungs: Respirations regular and unlabored, without wheezes or rales.  Heart: Irregularly irregular. No S3 or S4.  No murmur, no rubs, or gallops appreciated. Abdomen: Soft, non-tender, non-distended with normoactive bowel sounds. No hepatomegaly. No rebound/guarding. No obvious abdominal masses. Msk:  Strength and tone appear normal for age. No joint deformities or effusions. Extremities: No clubbing or cyanosis. Trace ankle edema bilaterally.  Distal pedal pulses are 2+ bilaterally. Neuro: Alert and oriented X 3. Moves all extremities spontaneously. No focal deficits noted. Psych:  Responds to questions appropriately with a normal affect. Skin: No rashes or lesions noted  Wt Readings from Last 3 Encounters:  05/27/18 207 lb (93.9 kg)  05/06/18 213 lb (96.6 kg)  05/03/18 201 lb 15.1 oz (91.6 kg)     Studies/Labs Reviewed:   EKG:  EKG is not ordered today.   Recent Labs: 08/27/2017:  ALT 22 03/19/2018: NT-Pro BNP 1,087 04/30/2018: B  Natriuretic Peptide 345.0 05/02/2018: BUN 33; Creatinine, Ser 1.01; Potassium 3.3; Sodium 139; TSH 5.484 05/03/2018: Hemoglobin 13.2; Platelets 225   Lipid Panel    Component Value Date/Time   CHOL 117 08/27/2017 0820   TRIG 83 08/27/2017 0820   HDL 34 (L) 08/27/2017 0820   CHOLHDL 3.4 08/27/2017 0820   CHOLHDL 3.2 06/12/2016 0929   VLDL 16 06/12/2016 0929   LDLCALC 66 08/27/2017 0820    Additional studies/ records that were reviewed today include:   NST: 03/2018  There was no ST segment deviation noted during stress.  Findings consistent with prior inferior/apical myocardial infarction without current ischemia  This is an intermediate risk study. Risk based on decreased LVEF, no current myocardium at jeopardy. Correlate LVEF with echo.  The left ventricular ejection fraction is mildly decreased (49%).  Echocardiogram: 03/2018 Study Conclusions  - Left ventricle: The cavity size was normal. Wall thickness was   increased in a pattern of moderate LVH. Systolic function was   normal. The estimated ejection fraction was in the range of 60%   to 65%. Wall motion was normal; there were no regional wall   motion abnormalities. The study is not technically sufficient to   allow evaluation of LV diastolic function. - Aortic valve: Moderately calcified annulus. Trileaflet;   moderately thickened leaflets. Valve area (VTI): 1.79 cm^2. Valve   area (Vmax): 1.79 cm^2. Valve area (Vmean): 1.76 cm^2. - Mitral valve: Mildly calcified annulus. Mildly thickened leaflets   . There was mild regurgitation. - Left atrium: The atrium was moderately dilated. - Right atrium: The atrium was moderately dilated.  Assessment:    1. Coronary artery disease of native artery of native heart with stable angina pectoris (HCC)   2. Persistent atrial fibrillation   3. Chronic diastolic (congestive) heart failure ([place])   4. Essential hypertension   5.  Hyperlipidemia LDL goal <70   6. Snoring   7. Sleep disorder breathing   8. Medication management      Plan:   In order of problems listed above:  1. CAD - s/p PTCA and stenting of mid-LAD and LCx in 2009. Recent stress test in 03/2018 showed findings consistent with prior infarct without current ischemia.  - denies any recent chest pain and his dyspnea has significantly improved. No recurrent anginal symptoms.  - remains on ASA (has been on this along with Coumadin), BB, Imdur, and statin therapy.   2. Persistent Atrial Fibrillation - he denies any recent palpitations and rates remain well-controlled in the 80's. Continue Coreg 25mg  BID for rate-control. - he denies any evidence of active bleeding. Remains on Coumadin for anticoagulation. Overdue for INR and will recheck today and forward to the [facility]madin Clinic for recommendations.   3. Chronic Diastolic CHF - reports that his breathing is now back to baseline and denies any recent orthopnea or PND. No longer using supplemental oxygen. Lungs are clear on examination and his edema has improved.  - remains on Lasix 20mg  daily. Will recheck a BMET. Reviewed sodium and fluid restriction.   4. HTN - BP is well-controlled at 118/68 during today's visit.  - remains on Coreg 25mg  BID, Imdur 60mg  daily, and Lisinopril 20mg  daily.   5. HLD - followed by PCP. Goal LDL is < 70 with known CAD. Remains on Crestor 10mg  daily.   6. Snoring/ Sleep Disorder Breathing - the patient's wife reports he has been snoring for several years and this improved with the use of supplemental oxygen. Still having episodes  of where he "stops breathing" at night per her report. The patient wishes to undergo a sleep study and this certainly seems reasonable given his CHF and atrial fibrillation. Will enter referral for sleep study today.    Medication Adjustments/Labs and Tests Ordered: Current medicines are reviewed at length with the patient today.  Concerns  regarding medicines are outlined above.  Medication changes, Labs and Tests ordered today are listed in the Patient Instructions below. Patient Instructions   Medication Instructions:  Your physician recommends that you continue on your current medications as directed. Please refer to the Current Medication list given to you today.  If you need a refill on your cardiac medications before your next appointment, please call your pharmacy.   Lab work: Your physician recommends that you return for lab work this week.  If you have labs (blood work) drawn today and your tests are completely normal, you will receive your results only by: Marland Kitchen MyChart Message (if you have MyChart) OR . A paper copy in the mail If you have any lab test that is abnormal or we need to change your treatment, we will call you to review the results.  Testing/Procedures: Sleep Study   Follow-Up: At [facility], you and your health needs are our priority.  As part of our continuing mission to provide you with exceptional heart care, we have created designated Provider Care Teams.  These Care Teams include your primary Cardiologist (physician) and Advanced Practice Providers (APPs -  Physician Assistants and Nurse Practitioners) who all work together to provide you with the care you need, when you need it. You will need a follow up appointment in 2-3 months.  Please call our office 2 months in advance to schedule this appointment.  You may see Sanda Klein, MD or one of the following Advanced Practice Providers on your designated Care Team:   [place], PA-C [facility]) . Ermalinda Barrios, PA-C ([place])  Any Other Special Instructions Will Be Listed Below (If Applicable). Thank you for choosing [facility]!   Signed, Erma Heritage, PA-C  05/27/2018 7:39 PM    [facility] [address] [place], [place] [address] Phone: [phone] Fax: [phone]

## 2018-05-28 NOTE — Progress Notes (Signed)
Great news. We'll set his dry weight at 207, definitely keep < 210. Less salt, more snow. Thanks! MCr

## 2018-06-01 LAB — BASIC METABOLIC PANEL
BUN / CREAT RATIO: 29 (calc) — AB (ref 6–22)
BUN: 29 mg/dL — ABNORMAL HIGH (ref 7–25)
CO2: 29 mmol/L (ref 20–32)
Calcium: 8.6 mg/dL (ref 8.6–10.3)
Chloride: 107 mmol/L (ref 98–110)
Creat: 1.01 mg/dL (ref 0.70–1.11)
Glucose, Bld: 89 mg/dL (ref 65–139)
POTASSIUM: 4 mmol/L (ref 3.5–5.3)
SODIUM: 143 mmol/L (ref 135–146)

## 2018-06-02 ENCOUNTER — Telehealth: Payer: Self-pay | Admitting: *Deleted

## 2018-06-02 NOTE — Telephone Encounter (Signed)
Called patient with test results. No answer. Left message to call back.  

## 2018-06-02 NOTE — Telephone Encounter (Signed)
-----   Message from Erma Heritage, [place] sent at 06/02/2018  7:24 AM EST ----- Please let the patient know his kidney function and electrolytes remain stable. Continue current medication regimen at this time. Please forward a copy of results to Octavio Graves, DO. Thank you.

## 2018-06-23 ENCOUNTER — Other Ambulatory Visit: Payer: Self-pay

## 2018-06-23 ENCOUNTER — Ambulatory Visit (INDEPENDENT_AMBULATORY_CARE_PROVIDER_SITE_OTHER): Payer: Medicare Other | Admitting: *Deleted

## 2018-06-23 DIAGNOSIS — Z5181 Encounter for therapeutic drug level monitoring: Secondary | ICD-10-CM | POA: Diagnosis not present

## 2018-06-23 DIAGNOSIS — I4891 Unspecified atrial fibrillation: Secondary | ICD-10-CM

## 2018-06-23 DIAGNOSIS — Z7901 Long term (current) use of anticoagulants: Secondary | ICD-10-CM | POA: Diagnosis not present

## 2018-06-23 LAB — POCT INR: INR: 2.2 (ref 2.0–3.0)

## 2018-06-23 NOTE — Patient Instructions (Signed)
Continue coumadin 1 tablet daily except 1/2 tablet on Tuesdays and Saturdays.  Continue greens. Recheck in 6 weeks.

## 2018-06-28 ENCOUNTER — Telehealth: Payer: Self-pay | Admitting: Student

## 2018-06-28 NOTE — Telephone Encounter (Signed)
error 

## 2018-07-08 ENCOUNTER — Institutional Professional Consult (permissible substitution): Payer: Self-pay | Admitting: Neurology

## 2018-07-23 ENCOUNTER — Telehealth: Payer: Self-pay | Admitting: Student

## 2018-07-23 MED ORDER — LISINOPRIL 20 MG PO TABS: 20.0000 mg | ORAL_TABLET | Freq: Every day | ORAL | 3 refills | Status: DC

## 2018-07-23 NOTE — Telephone Encounter (Addendum)
Patient should be taking lisinopril 20 mg as ordered and he will pick up at [facility]

## 2018-07-23 NOTE — Telephone Encounter (Signed)
lisinopril (PRINIVIL,ZESTRIL) 20 MG tablet   Patient questioning if he is suppose to be taking medication or not.  It has been removed from his medication list since his last hospital visit

## 2018-08-02 ENCOUNTER — Other Ambulatory Visit: Payer: Self-pay

## 2018-08-02 ENCOUNTER — Ambulatory Visit (INDEPENDENT_AMBULATORY_CARE_PROVIDER_SITE_OTHER): Payer: Medicare Other | Admitting: *Deleted

## 2018-08-02 DIAGNOSIS — I4891 Unspecified atrial fibrillation: Secondary | ICD-10-CM | POA: Diagnosis not present

## 2018-08-02 DIAGNOSIS — Z5181 Encounter for therapeutic drug level monitoring: Secondary | ICD-10-CM

## 2018-08-02 LAB — POCT INR: INR: 2.2 (ref 2.0–3.0)

## 2018-08-02 NOTE — Patient Instructions (Signed)
Continue coumadin 1 tablet daily except 1/2 tablet on Tuesdays, Thursdays and Saturdays.   Continue greens. Recheck in 6 weeks.

## 2018-08-13 ENCOUNTER — Telehealth: Payer: Self-pay | Admitting: Student

## 2018-08-13 NOTE — Telephone Encounter (Signed)
Patient states he has a twitching sensation over his left breast for the past 3 days.He said it happened years ago but never gave it any thought.Says it can last from 15-30 seconds and is gone. He denies pain, pressure, SOB. I asked if anything relieves it and he says it goes away on its own. He says his heart rate and blood pressure are "good" and he has an apt with Dr.Croitoru next week.

## 2018-08-13 NOTE — Telephone Encounter (Signed)
He checked hear rate  Skipping pretty bad Ran smooth for 30 beats then starting skipping the vibrated and skipping stopped

## 2018-08-13 NOTE — Telephone Encounter (Signed)
    He has known atrial fibrillation so perhaps that is what he is feeling. If no associated chest pain or dyspnea, would just continue to monitor for now. Would be helpful if he can check his HR when these episodes occur but it sounds like they are very brief. Would keep follow-up next week and can further address at that time. Dr. Sallyanne Kuster may consider a monitor but would await further evaluation prior to ordering.   Signed, Erma Heritage, PA-C 08/13/2018, 3:51 PM Pager: [phone]

## 2018-08-13 NOTE — Telephone Encounter (Signed)
I had to leave message with advice for him. Asked him to call back if he had any questions

## 2018-08-13 NOTE — Telephone Encounter (Signed)
Asking for someone to call  He is having something vibrating in left side of his chest  No chest pain No SOB

## 2018-08-16 ENCOUNTER — Other Ambulatory Visit: Payer: Self-pay

## 2018-08-16 ENCOUNTER — Encounter: Payer: Self-pay | Admitting: Cardiovascular Disease

## 2018-08-16 ENCOUNTER — Telehealth: Payer: Self-pay

## 2018-08-16 ENCOUNTER — Telehealth (INDEPENDENT_AMBULATORY_CARE_PROVIDER_SITE_OTHER): Payer: Medicare Other | Admitting: Cardiovascular Disease

## 2018-08-16 VITALS — BP 122/63 | HR 72 | Wt 194.0 lb

## 2018-08-16 DIAGNOSIS — Z79899 Other long term (current) drug therapy: Secondary | ICD-10-CM

## 2018-08-16 DIAGNOSIS — I25118 Atherosclerotic heart disease of native coronary artery with other forms of angina pectoris: Secondary | ICD-10-CM

## 2018-08-16 DIAGNOSIS — I480 Paroxysmal atrial fibrillation: Secondary | ICD-10-CM | POA: Diagnosis not present

## 2018-08-16 DIAGNOSIS — E039 Hypothyroidism, unspecified: Secondary | ICD-10-CM

## 2018-08-16 DIAGNOSIS — E7800 Pure hypercholesterolemia, unspecified: Secondary | ICD-10-CM

## 2018-08-16 DIAGNOSIS — I5032 Chronic diastolic (congestive) heart failure: Secondary | ICD-10-CM | POA: Diagnosis not present

## 2018-08-16 DIAGNOSIS — Z7901 Long term (current) use of anticoagulants: Secondary | ICD-10-CM

## 2018-08-16 DIAGNOSIS — I10 Essential (primary) hypertension: Secondary | ICD-10-CM

## 2018-08-16 DIAGNOSIS — D509 Iron deficiency anemia, unspecified: Secondary | ICD-10-CM

## 2018-08-16 DIAGNOSIS — I453 Trifascicular block: Secondary | ICD-10-CM

## 2018-08-16 NOTE — Progress Notes (Signed)
Virtual Visit via Video Note   This visit type was conducted due to national recommendations for restrictions regarding the COVID-19 Pandemic (e.g. social distancing) in an effort to limit this patient's exposure and mitigate transmission in our community.  Due to his co-morbid illnesses, this patient is at least at moderate risk for complications without adequate follow up.  This format is felt to be most appropriate for this patient at this time.  All issues noted in this document were discussed and addressed.  A limited physical exam was performed with this format.  Please refer to the patient's chart for his consent to telehealth for [facility].   Date:  08/16/2018   ID:  Jerry Cain, DOB 05/30/1931, MRN [id number]  Patient Location: Home Provider Location: Other:  [facility]  PCP:  Octavio Graves, DO  Cardiologist:  Sanda Klein, MD  Electrophysiologist:  None   Evaluation Performed:  Follow-Up Visit  Chief Complaint:  Irregular heart rhythm  History of Present Illness:    Jerry Cain is a 85 y.o. male with chronic diastolic heart failure coronary artery disease with remote stents in mid LAD and left circumflex arteries (2009), episodes of both paroxysmal and persistent atrial fibrillation, trifascicular block, hypertension and hypercholesterolemia.  He was hospitalized in January with acute exacerbation of heart failure and his weight was as high as 213 pounds.  He had exacerbation of "asthma" around that time as well.  He denies angina or dyspnea either at rest or with exertion and has not had dizziness, syncope or leg edema.  He describes a sensation of vibration in his chest and has noticed that as many as 3 times a week his rhythm will be irregular.  His blood pressure is consistently in the 110s-120s/60s.  Only once in the last several weeks has his heart rate reached 83 bpm.  Usually his rhythm is regular and his heart rate is around 60-70 bpm.   His "dry weight" appears to be around 194 pounds and he has been consistently maintaining 193-197 over the last several weeks.  When he was less than 190 pounds he developed orthostatic dizziness.  Over 197 he developed shortness of breath.  He has noticed that if he follows a diet low in tomatoes, potatoes and starchy foods that he has less joint pain.  The patient does not have symptoms concerning for COVID-19 infection (fever, chills, cough, or new shortness of breath).    Past Medical History:  Diagnosis Date  . Adenomatous colon polyp 2001  . Anxiety   . Atrial fibrillation ([place])    takes Coumadin daily  . BPH (benign prostatic hyperplasia)    takes Proscar daily  . CAD (coronary artery disease) 2009   cardiac cath and PTCA by Dr. Tami Ribas  . Carpal tunnel syndrome    right  . Complication of anesthesia    hard to wake up  . Diverticulosis 2007   tcs by Dr. Laural Golden  . GERD (gastroesophageal reflux disease)   . Gout   . Hypercholesterolemia    takes Atorvastatin daily  . Hypertension    takes Imdur,Coreg,and Lisinopril daily  . Hypothyroidism    takes SYnthroid daily  . Joint pain   . Joint swelling   . Nocturia   . Numbness    right hand pointer and middle finger  . Osteoarthritis    left knee  . Pneumonia    many,many yrs ago  . Tubular adenoma 2001   Past Surgical History:  Procedure Laterality Date  . Barbie Banner OSTEOTOMY Right 03/17/2013   Procedure: Barbie Banner OSTEOTOMY;  Surgeon: Marcheta Grammes, DPM;  Location: [facility] ORS;  Service: Orthopedics;  Laterality: Right;  . BUNIONECTOMY Right 03/17/2013   Procedure: BUNIONECTOMY, Arthroplasty 2nd toe right foot;  Surgeon: Marcheta Grammes, DPM;  Location: [facility] ORS;  Service: Orthopedics;  Laterality: Right;  . CARDIAC CATHETERIZATION  06/11/2007   PTCA X2 in 06/2007:  2 overlapping Cypher stents to LAD (2.5x13 and 3.0x23)  . CARDIAC CATHETERIZATION  06/21/2007   Cypher stent 2.5 x 13  to OM branch  .  CARDIOVERSION  12/12/2011   Procedure: CARDIOVERSION;  Surgeon: Sanda Klein, MD;  Location: [facility] END[facility];  Service: Cardiovascular;  Laterality: N/A;  . CATARACT EXTRACTION     bilateral  . COLONOSCOPY  2007   Dr. Laural Golden- L sided diverticulosis. Next TCS 2012 due to h/o tubular adenoma.  . COLONOSCOPY  02/26/2011   Procedure: COLONOSCOPY;  Surgeon: Daneil Dolin, MD;  Location: [facility] ENDO SUITE;  Service: Endoscopy;  Laterality: N/A;  11:05  . CORONARY ANGIOPLASTY     x 3   . FLEXOR TENOTOMY Left 03/17/2013   Procedure: PERCUTANEOUS FLEXOR TENOTOMY 3RD TOE LEFT FOOT;  Surgeon: Marcheta Grammes, DPM;  Location: [facility] ORS;  Service: Orthopedics;  Laterality: Left;  . HERNIA REPAIR  2009   left inguinal  . rot Left   . TEE WITHOUT CARDIOVERSION  12/12/2011   Procedure: TRANSESOPHAGEAL ECHOCARDIOGRAM (TEE);  Surgeon: Sanda Klein, MD;  Location[facility] END[facility];  Service: Cardiovascular;  Laterality: N/A;   successful/sinus rhythm  . TOTAL KNEE ARTHROPLASTY Left 06/14/2013   Procedure: TOTAL KNEE ARTHROPLASTY;  Surgeon: Garald Balding, MD;  Location: [place];  Service: Orthopedics;  Laterality: Left;     No outpatient medications have been marked as taking for the 08/16/18 encounter (Telemedicine) with Sheresa Cullop, Dani Gobble, MD.     Allergies:   Carbapenems; Cephalosporins; and Penicillins   Social History   Tobacco Use  . Smoking status: Never Smoker  . Smokeless tobacco: Never Used  Substance Use Topics  . Alcohol use: No  . Drug use: No     Family Hx: The patient's family history includes Heart disease in his brother; Pneumonia in his father. There is no history of Colon cancer.  ROS:   Please see the history of present illness.     All other systems reviewed and are negative.   Prior CV studies:   The following studies were reviewed today:  Echo 03/25/2018 - Left ventricle: The cavity size was normal. Wall thickness was   increased in a pattern of moderate LVH. Systolic  function was   normal. The estimated ejection fraction was in the range of 60%   to 65%. Wall motion was normal; there were no regional wall   motion abnormalities. The study is not technically sufficient to   allow evaluation of LV diastolic function. - Aortic valve: Moderately calcified annulus. Trileaflet;   moderately thickened leaflets. Valve area (VTI): 1.79 cm^2. Valve   area (Vmax): 1.79 cm^2. Valve area (Vmean): 1.76 cm^2. - Mitral valve: Mildly calcified annulus. Mildly thickened leaflets   . There was mild regurgitation. - Left atrium: The atrium was moderately dilated. - Right atrium: The atrium was moderately dilated.  Nuclear stress test 03/25/2018   There was no ST segment deviation noted during stress.  Findings consistent with prior inferior/apical myocardial infarction without current ischemia  This is an intermediate risk study. Risk based on decreased LVEF,  no current myocardium at jeopardy. Correlate LVEF with echo.  The left ventricular ejection fraction is mildly decreased (49%).   Labs/Other Tests and Data Reviewed:    EKG:  An ECG dated 05/03/2018 was personally reviewed today and demonstrated:  Atrial fibrillation, right bundle branch block, left anterior fascicular block  Recent Labs: 08/27/2017: ALT 22 03/19/2018: NT-Pro BNP 1,087 04/30/2018: B Natriuretic Peptide 345.0 05/02/2018: TSH 5.484 05/03/2018: Hemoglobin 13.2; Platelets 225 06/01/2018: BUN 29; Creat 1.01; Potassium 4.0; Sodium 143   Recent Lipid Panel Lab Results  Component Value Date/Time   CHOL 117 08/27/2017 08:20 AM   TRIG 83 08/27/2017 08:20 AM   HDL 34 (L) 08/27/2017 08:20 AM   CHOLHDL 3.4 08/27/2017 08:20 AM   CHOLHDL 3.2 06/12/2016 09:29 AM   LDLCALC 66 08/27/2017 08:20 AM    Wt Readings from Last 3 Encounters:  08/16/18 194 lb (88 kg)  05/27/18 207 lb (93.9 kg)  05/06/18 213 lb (96.6 kg)     Objective:    Vital Signs:  BP 122/63   Pulse 72   Wt 194 lb (88 kg)   HC  69" (175.3 cm)   BMI 27.84 kg/m    VITAL SIGNS:  reviewed GEN:  no acute distress EYES:  sclerae anicteric, EOMI - Extraocular Movements Intact RESPIRATORY:  normal respiratory effort, symmetric expansion CARDIOVASCULAR:  no peripheral edema SKIN:  no rash, lesions or ulcers. MUSCULOSKELETAL:  no obvious deformities. NEURO:  alert and oriented x 3, no obvious focal deficit PSYCH:  normal affect  ASSESSMENT & PLAN:    1. CAD: asymptomatic, in part because he is obligated to be more sedentary due to his knee problems.  About a year ago he had class I stable angina on 2 antianginal medications (only had vague chest tightness when he was walking fast up a steep hill).. Low risk recent nuclear study. Despite anticoagulation, prefers to continue ASA since it helps his joint symptoms. 2. CHF: Appears compensated, NYHA class I.  Seems to be euvolemic on a very low-dose of loop diuretic.  Continue daily weights and call if under 190 pounds or over 197 pounds. 3. AFib: He has lengthy periods of persistent atrial fibrillation, but also spontaneous recurrence of sinus rhythm and brief episodes of paroxysmal atrial fibrillation.  The arrhythmia is well rate controlled and does not appear to be the cause for his heart failure decompensation.  Antiarrhythmics are not justified.  Continue warfarin anticoagulation.   4. Warfarin: His INR is virtually always in the therapeutic range. 5. Trifascicular block: Higher doses of rate control medications and antiarrhythmics might precipitate the need for a pacemaker.  At this point he has no bradycardia symptoms and appears well compensated.  Pacemaker not indicated. 6. HLP: LDL at target when checked a year ago.  We will recheck a lipid profile later in the summer when there are fewer coronavirus restrictions. 7. HTN: Excellent control. 8. Iron deficiency anemia: He had very mild anemia early in the year with labs consistent with iron deficiency.  Encouraged him to  take a multivitamin rich in iron such as a prenatal vitamin. 9. Hypothyroidism: Borderline elevated TSH in January COVID-19 Education: The signs and symptoms of COVID-19 were discussed with the patient and how to seek care for testing (follow up with PCP or arrange E-visit).  The importance of social distancing was discussed today.  Time:   Today, I have spent 26 minutes with the patient with telehealth technology discussing the above problems.     Medication  Adjustments/Labs and Tests Ordered: Current medicines are reviewed at length with the patient today.  Concerns regarding medicines are outlined above.   Tests Ordered: No orders of the defined types were placed in this encounter.   Medication Changes: No orders of the defined types were placed in this encounter.   Disposition:  Follow up 6 months  Signed, Sanda Klein, MD  08/16/2018 2:26 PM    [place]

## 2018-08-16 NOTE — Telephone Encounter (Signed)

## 2018-08-16 NOTE — Patient Instructions (Addendum)
Medication Instructions:  Please take a multivitamin daily with iron. A prenatal vitamin is a good option.  If you need a refill on your cardiac medications before your next appointment, please call your pharmacy.   Lab work: Your provider would like for you to return in 3 months to have the following labs drawn: FASTING Lipid, BMET, CBC, TSH and Iron Studies. You do not need an appointment for the lab. Once in our office lobby there is a podium where you can sign in and ring the doorbell to alert [place] that you are here. The lab is open from 8:00 am to 4:30 pm; closed for lunch from 12:45pm-1:45pm.  Testing/Procedures: None ordered  Follow-Up: At [facility], you and your health needs are our priority.  As part of our continuing mission to provide you with exceptional heart care, we have created designated Provider Care Teams.  These Care Teams include your primary Cardiologist (physician) and Advanced Practice Providers (APPs -  Physician Assistants and Nurse Practitioners) who all work together to provide you with the care you need, when you need it. You will need a follow up appointment in 6 months.  Please call our office 2 months in advance to schedule this appointment.  You may see Sanda Klein, MD or one of the following Advanced Practice Providers on your designated Care Team: [place], [place] . Fabian Sharp, PA-C  Any Other Special Instructions Will Be Listed Below (If Applicable). Please call the office at [phone]:  -If your heart rate stays above 100 beats per minute for more than 48 hours Continue to monitor your daily weights and call the office:  -If your weight gets less than 190 or greater than 197 pounds.  Marland Kitchen

## 2018-08-18 ENCOUNTER — Telehealth: Payer: Self-pay | Admitting: Cardiovascular Disease

## 2018-08-18 ENCOUNTER — Other Ambulatory Visit: Payer: Self-pay | Admitting: *Deleted

## 2018-08-18 DIAGNOSIS — D509 Iron deficiency anemia, unspecified: Secondary | ICD-10-CM

## 2018-08-18 DIAGNOSIS — I5032 Chronic diastolic (congestive) heart failure: Secondary | ICD-10-CM

## 2018-08-18 DIAGNOSIS — K625 Hemorrhage of anus and rectum: Secondary | ICD-10-CM

## 2018-08-18 DIAGNOSIS — I10 Essential (primary) hypertension: Secondary | ICD-10-CM

## 2018-08-18 DIAGNOSIS — I25118 Atherosclerotic heart disease of native coronary artery with other forms of angina pectoris: Secondary | ICD-10-CM

## 2018-08-18 DIAGNOSIS — Z7901 Long term (current) use of anticoagulants: Secondary | ICD-10-CM

## 2018-08-18 DIAGNOSIS — I480 Paroxysmal atrial fibrillation: Secondary | ICD-10-CM

## 2018-08-18 NOTE — Telephone Encounter (Signed)
Noted Jerry Cain in lab aware and can see orders for CBC and INR ./cy

## 2018-08-18 NOTE — Telephone Encounter (Signed)
New Message   Patient states he's hemorrhaging was told by medics that came to his residence.  Patient would like to speak to a nurse.

## 2018-08-18 NOTE — Telephone Encounter (Signed)
Returned call to patient he stated has passed large amount of bright red blood twice this morning during 2 bowel movements.Stated he is on coumadin, last INR 2.2 or 2.3 on 06/23/18.He has INR checked at [facility] office.Message sent to Dr.Croitoru for advice.

## 2018-08-18 NOTE — Telephone Encounter (Signed)
Follow Up;    Pt is on his way here for lab work. He said the lab n [place] was closed.

## 2018-08-18 NOTE — Telephone Encounter (Signed)
Stop aspirin. Hold warfarin and check CBC and INR today. Is he having any symptoms of low blood pressure? MCr

## 2018-08-18 NOTE — Telephone Encounter (Signed)
Follow Up:     Pt calling to find out what was decided.

## 2018-08-18 NOTE — Telephone Encounter (Signed)
Pt aware of recommendations and will go to [facility] for INR and CBC and knows to stop Coumadin and hold ASa./cy

## 2018-08-19 ENCOUNTER — Ambulatory Visit: Payer: Medicare Other | Admitting: Cardiovascular Disease

## 2018-08-19 ENCOUNTER — Telehealth: Payer: Self-pay | Admitting: *Deleted

## 2018-08-19 LAB — CBC WITH DIFFERENTIAL/PLATELET
Basophils Absolute: 0 10*3/uL (ref 0.0–0.2)
Basos: 0 %
EOS (ABSOLUTE): 0.2 10*3/uL (ref 0.0–0.4)
Eos: 3 %
Hematocrit: 34.8 % — ABNORMAL LOW (ref 37.5–51.0)
Hemoglobin: 11.5 g/dL — ABNORMAL LOW (ref 13.0–17.7)
Immature Grans (Abs): 0 10*3/uL (ref 0.0–0.1)
Immature Granulocytes: 0 %
Lymphocytes Absolute: 1.4 10*3/uL (ref 0.7–3.1)
Lymphs: 29 %
MCH: 29.9 pg (ref 26.6–33.0)
MCHC: 33 g/dL (ref 31.5–35.7)
MCV: 90 fL (ref 79–97)
Monocytes Absolute: 0.5 10*3/uL (ref 0.1–0.9)
Monocytes: 10 %
Neutrophils Absolute: 2.8 10*3/uL (ref 1.4–7.0)
Neutrophils: 58 %
Platelets: 170 10*3/uL (ref 150–450)
RBC: 3.85 x10E6/uL — ABNORMAL LOW (ref 4.14–5.80)
RDW: 13.8 % (ref 11.6–15.4)
WBC: 4.9 10*3/uL (ref 3.4–10.8)

## 2018-08-19 LAB — PROTIME-INR
INR: 2.1 — ABNORMAL HIGH (ref 0.8–1.2)
Prothrombin Time: 21.7 s — ABNORMAL HIGH (ref 9.1–12.0)

## 2018-08-19 NOTE — Telephone Encounter (Signed)
Attempted to reach the patient. Was unable to leave a message 

## 2018-08-19 NOTE — Telephone Encounter (Signed)
-----   Message from Sanda Klein, MD sent at 08/19/2018 11:39 AM EDT ----- Labs look OK. Hgb is a little lower than last check, but similar to his usual range over the last several years. INR is at lower limit of desired range. Any more bleeding?

## 2018-08-20 NOTE — Telephone Encounter (Signed)
Left a message for the patient to call back.  

## 2018-08-20 NOTE — Telephone Encounter (Signed)
Patient has been called and made aware. If he has any concerns, he has been advised to call back or call the on call during off hours.

## 2018-08-20 NOTE — Telephone Encounter (Signed)
Follow up ° ° °Patient is returning your call. Please call. ° ° ° °

## 2018-08-20 NOTE — Telephone Encounter (Signed)
Restart warfarin only please. I know he finds the aspirin useful for his joints, but please stay off it for now. MCr

## 2018-08-20 NOTE — Telephone Encounter (Signed)
Patient made aware of results and verbalized understanding.  He stated that he has not had any bleeding since Wednesday evening. He has held his warfarin and 81 mg aspirin since Wednesday (last dose was Tuesday). He would like to know if he should start back on these medications.

## 2018-08-27 ENCOUNTER — Telehealth: Payer: Self-pay | Admitting: Cardiovascular Disease

## 2018-08-27 ENCOUNTER — Other Ambulatory Visit: Payer: Self-pay | Admitting: Cardiovascular Disease

## 2018-08-27 NOTE — Telephone Encounter (Signed)
New Message      *STAT* If patient is at the pharmacy, call can be transferred to refill team.   1. Which medications need to be refilled? (please list name of each medication and dose if known) Cramadol   2. Which pharmacy/location (including street and city if local pharmacy) is medication to be sent to? [facility] in [place]   3. Do they need a 30 day or 90 day supply? 30 day

## 2018-09-13 ENCOUNTER — Telehealth: Payer: Self-pay | Admitting: Cardiovascular Disease

## 2018-09-13 ENCOUNTER — Telehealth: Payer: Self-pay | Admitting: Student

## 2018-09-13 DIAGNOSIS — M79605 Pain in left leg: Secondary | ICD-10-CM

## 2018-09-13 NOTE — Telephone Encounter (Signed)
° ° °  COVID-19 Pre-Screening Questions: ° °• Do you currently have a fever? No °•  °• Have you recently travelled on a cruise, internationally, or to [place], [place], [place], [place], [place], or [place], [place] ([place]) ?NO °•  °• Have you been in contact with someone that is currently pending confirmation of Covid19 testing or has been confirmed to have the Covid19 virus?  NO °•  °Are you currently experiencing fatigue or cough? NO ° ° °   ° ° ° ° °

## 2018-09-13 NOTE — Telephone Encounter (Signed)
Returned pt call. He states that for the past week his left leg (near  Calf) has been swelling a lot more than normal. He stated that it will go down at night when he sleeps, but as soon as he gets up and moving around in the morning, it swells back up. His weight has not changed at all. He said that it sometimes has a little pain in it. Please advise.

## 2018-09-13 NOTE — Telephone Encounter (Signed)
Pt is having swelling in left foot/leg [phone]

## 2018-09-14 ENCOUNTER — Other Ambulatory Visit: Payer: Self-pay

## 2018-09-14 ENCOUNTER — Other Ambulatory Visit: Payer: Self-pay | Admitting: Cardiovascular Disease

## 2018-09-14 ENCOUNTER — Ambulatory Visit (INDEPENDENT_AMBULATORY_CARE_PROVIDER_SITE_OTHER): Payer: Medicare Other | Admitting: *Deleted

## 2018-09-14 ENCOUNTER — Other Ambulatory Visit (HOSPITAL_COMMUNITY)
Admission: RE | Admit: 2018-09-14 | Discharge: 2018-09-14 | Disposition: A | Discharge status: Home / Self Care | Payer: Medicare Other | Source: Ambulatory Visit | Attending: Student | Admitting: Student

## 2018-09-14 DIAGNOSIS — I4891 Unspecified atrial fibrillation: Secondary | ICD-10-CM

## 2018-09-14 DIAGNOSIS — M79605 Pain in left leg: Secondary | ICD-10-CM | POA: Insufficient documentation

## 2018-09-14 DIAGNOSIS — Z5181 Encounter for therapeutic drug level monitoring: Secondary | ICD-10-CM

## 2018-09-14 LAB — D-DIMER, QUANTITATIVE: D-Dimer, Quant: 0.73 ug/mL-FEU — ABNORMAL HIGH (ref 0.00–0.50)

## 2018-09-14 LAB — POCT INR: INR: 2.3 (ref 2.0–3.0)

## 2018-09-14 NOTE — Telephone Encounter (Signed)
    Please confirm that he has been taking Lasix 20mg  daily. Given the pain in his calf and having been off Coumadin at times within the past month, I would recommend he have a d-dimer lab test to rule-out a DVT (associations are atrial fibrillation and left leg pain). If positive, he will need a lower extremity ultrasound.   Signed, Erma Heritage, PA-C 09/14/2018, 7:21 AM Pager: [phone]

## 2018-09-14 NOTE — Telephone Encounter (Signed)
Called pt., no answer. Left message for pt to return call.  

## 2018-09-14 NOTE — Patient Instructions (Signed)
Continue coumadin 1 tablet daily except 1/2 tablet on Tuesdays, Thursdays and Saturdays.   Continue greens. Recheck in 6 weeks.

## 2018-09-14 NOTE — Telephone Encounter (Signed)
Pt made aware, voiced understanding. Faxed lab order to [facility] lab.

## 2018-09-15 ENCOUNTER — Telehealth: Payer: Self-pay | Admitting: *Deleted

## 2018-09-15 DIAGNOSIS — M79605 Pain in left leg: Secondary | ICD-10-CM

## 2018-09-15 NOTE — Telephone Encounter (Signed)
-----   Message from Erma Heritage, [place] sent at 09/14/2018  2:40 PM EDT ----- Please let the patient know his D-dimer was elevated which does not confirm a clot but also does not rule one out. He therefore needs a lower extremity doppler of the left leg to rule-out a DVT. Again, he was on Coumadin for anticoagulation but this was held for several days a few weeks ago. While the treatment for a clot would be Coumadin, we need to know if something like that developed so quickly as this would advise against holding in the future.   Thanks,  [name]

## 2018-09-15 NOTE — Telephone Encounter (Signed)
Called pt no answer. LMTCB. Daughter given test results

## 2018-09-16 NOTE — Telephone Encounter (Signed)
Returned pt call. No answer left msg to call back.

## 2018-09-16 NOTE — Telephone Encounter (Signed)
Returned pt call. No answer. Left message for pt to return call.  

## 2018-09-16 NOTE — Telephone Encounter (Signed)
Spoke to pt who would like to schedule LE doppler. Pt notified that he will be called today to schedule. Pt voiced understanding.

## 2018-09-16 NOTE — Telephone Encounter (Signed)
Please call patient [phone].

## 2018-09-16 NOTE — Telephone Encounter (Signed)
Patient called the [facility] requesting to speak with Jerry Cain. Patient is very anxious about receiving his test results.

## 2018-10-11 ENCOUNTER — Other Ambulatory Visit: Payer: Self-pay | Admitting: Cardiovascular Disease

## 2018-10-11 NOTE — Telephone Encounter (Signed)
Rx(s) sent to pharmacy electronically.  

## 2018-10-16 ENCOUNTER — Other Ambulatory Visit: Payer: Self-pay | Admitting: Cardiovascular Disease

## 2018-10-26 ENCOUNTER — Ambulatory Visit (INDEPENDENT_AMBULATORY_CARE_PROVIDER_SITE_OTHER): Payer: Medicare Other | Admitting: *Deleted

## 2018-10-26 DIAGNOSIS — I4891 Unspecified atrial fibrillation: Secondary | ICD-10-CM

## 2018-10-26 DIAGNOSIS — Z5181 Encounter for therapeutic drug level monitoring: Secondary | ICD-10-CM

## 2018-10-26 LAB — POCT INR: INR: 2.6 (ref 2.0–3.0)

## 2018-10-26 NOTE — Patient Instructions (Signed)
Continue coumadin 1 tablet daily except 1/2 tablet on Tuesdays, Thursdays and Saturdays.   Continue greens. Recheck in 6 weeks.

## 2018-12-23 ENCOUNTER — Other Ambulatory Visit: Payer: Self-pay

## 2018-12-23 ENCOUNTER — Ambulatory Visit (INDEPENDENT_AMBULATORY_CARE_PROVIDER_SITE_OTHER): Payer: Medicare Other | Admitting: *Deleted

## 2018-12-23 DIAGNOSIS — Z5181 Encounter for therapeutic drug level monitoring: Secondary | ICD-10-CM | POA: Diagnosis not present

## 2018-12-23 DIAGNOSIS — I4891 Unspecified atrial fibrillation: Secondary | ICD-10-CM | POA: Diagnosis not present

## 2018-12-23 LAB — POCT INR: INR: 2.6 (ref 2.0–3.0)

## 2018-12-23 NOTE — Patient Instructions (Signed)
Continue coumadin 1 tablet daily except 1/2 tablet on Tuesdays, Thursdays and Saturdays.   Continue greens. Recheck in 6 weeks.  

## 2018-12-24 ENCOUNTER — Other Ambulatory Visit: Payer: Self-pay | Admitting: Cardiovascular Disease

## 2019-01-25 ENCOUNTER — Telehealth: Payer: Self-pay | Admitting: Pharmacist

## 2019-01-25 NOTE — Telephone Encounter (Signed)
Called pt to discuss changing from warfarin to [place] due to better efficacy and safety data, as well as less frequent monitoring, especially given COVID-19 pandemic.   Left message for pt to discuss - DOAC therapy would cost ~$47/month.

## 2019-01-25 NOTE — Telephone Encounter (Signed)
Patient returned call. Provided information below to patient. He took down the name of Eliquis but stated he wanted to talk to Dr. Loletha Grayer about it first.

## 2019-01-31 NOTE — Telephone Encounter (Signed)
LVM for patient to return call to pharmacy line. Want to discuss interest in switching from warfarin to [place] therapy.

## 2019-01-31 NOTE — Telephone Encounter (Signed)
This encounter was created in error - please disregard.

## 2019-02-07 ENCOUNTER — Ambulatory Visit (INDEPENDENT_AMBULATORY_CARE_PROVIDER_SITE_OTHER): Payer: Medicare Other | Admitting: *Deleted

## 2019-02-07 ENCOUNTER — Other Ambulatory Visit: Payer: Self-pay

## 2019-02-07 DIAGNOSIS — Z5181 Encounter for therapeutic drug level monitoring: Secondary | ICD-10-CM | POA: Diagnosis not present

## 2019-02-07 DIAGNOSIS — I4891 Unspecified atrial fibrillation: Secondary | ICD-10-CM | POA: Diagnosis not present

## 2019-02-07 LAB — POCT INR: INR: 2.2 (ref 2.0–3.0)

## 2019-02-07 NOTE — Patient Instructions (Signed)
Continue warfarin 1 tablet daily except 1/2 tablet on Tuesdays, Thursdays and Saturdays Recheck in 6 weeks 

## 2019-03-09 ENCOUNTER — Telehealth: Payer: Self-pay | Admitting: Cardiovascular Disease

## 2019-03-09 NOTE — Telephone Encounter (Signed)
Routed to primary nurse 

## 2019-03-09 NOTE — Telephone Encounter (Signed)
Patient will be bringing his daughter with him to his appointment tomorrow. He is hard of hearing and will need his daughter to help him hear what the doctor has to say

## 2019-03-09 NOTE — Telephone Encounter (Signed)
Noted  

## 2019-03-10 ENCOUNTER — Encounter: Payer: Self-pay | Admitting: Cardiovascular Disease

## 2019-03-10 ENCOUNTER — Ambulatory Visit (INDEPENDENT_AMBULATORY_CARE_PROVIDER_SITE_OTHER): Payer: Medicare Other | Admitting: Cardiovascular Disease

## 2019-03-10 ENCOUNTER — Other Ambulatory Visit: Payer: Self-pay

## 2019-03-10 VITALS — BP 144/85 | HR 77 | Temp 97.2°F | Ht 68.0 in | Wt 197.3 lb

## 2019-03-10 DIAGNOSIS — I2510 Atherosclerotic heart disease of native coronary artery without angina pectoris: Secondary | ICD-10-CM | POA: Diagnosis not present

## 2019-03-10 DIAGNOSIS — I453 Trifascicular block: Secondary | ICD-10-CM | POA: Diagnosis not present

## 2019-03-10 DIAGNOSIS — I10 Essential (primary) hypertension: Secondary | ICD-10-CM

## 2019-03-10 DIAGNOSIS — I4811 Longstanding persistent atrial fibrillation: Secondary | ICD-10-CM | POA: Diagnosis not present

## 2019-03-10 DIAGNOSIS — E785 Hyperlipidemia, unspecified: Secondary | ICD-10-CM

## 2019-03-10 DIAGNOSIS — I5032 Chronic diastolic (congestive) heart failure: Secondary | ICD-10-CM | POA: Diagnosis not present

## 2019-03-10 DIAGNOSIS — Z7901 Long term (current) use of anticoagulants: Secondary | ICD-10-CM

## 2019-03-10 MED ORDER — LEVOTHYROXINE SODIUM 150 MCG PO TABS: 150.0000 ug | ORAL_TABLET | Freq: Every morning | ORAL | 0 refills | Status: DC

## 2019-03-10 NOTE — Patient Instructions (Signed)
Medication Instructions:  No changes  *If you need a refill on your cardiac medications before your next appointment, please call your pharmacy*  Lab Work: None ordered If you have labs (blood work) drawn today and your tests are completely normal, you will receive your results only by: Marland Kitchen MyChart Message (if you have MyChart) OR . A paper copy in the mail If you have any lab test that is abnormal or we need to change your treatment, we will call you to review the results.  Testing/Procedures: None ordered  Follow-Up: At [facility], you and your health needs are our priority.  As part of our continuing mission to provide you with exceptional heart care, we have created designated Provider Care Teams.  These Care Teams include your primary Cardiologist (physician) and Advanced Practice Providers (APPs -  Physician Assistants and Nurse Practitioners) who all work together to provide you with the care you need, when you need it.  Your next appointment:   Follow up in 6 months with Bernerd Pho, [place] (in [place]) Follow up in 12 months with Dr. Sallyanne Kuster

## 2019-03-10 NOTE — Progress Notes (Signed)
[name], male   DOB: [birth year], 85 y.o.   MRN: [id number] [name], male   DOB: 09-22-1931, 85 y.o.   MRN: [id number]    Cardiology Office Note    Date:  03/12/2019   ID:  Jerry Cain, DOB Oct 09, 1931, MRN [id number]  PCP:  Patient, No Pcp Per  Cardiologist:   Sanda Klein, MD   Chief Complaint  Patient presents with  . Atrial Fibrillation  . Congestive Heart Failure    History of Present Illness:  Jerry Cain is a 85 y.o. male with CAD, diastolic heart failure, longstanding persistent atrial fibrillation, on warfarin anticoagulation, trifascicular block.  Recently he's done quite well.  He has NYHA functional class II dyspnea but is limited more by his knees than his breathing.  He is less active in the past, but has not had any angina for his current level of activity.  He does not have edema.  He has not had dizziness or syncope and is not aware of any palpitations.  He is keeping meticulous notes on his blood pressure, heart rate and weight.  His weight is consistently in the 191-194 pounds and his heart rate is always less than 80.  His blood pressure is typically 120-130s/70s.  Has become more aware of the salt content of different foods.  He is concerned that he may have developed coronary blockages.  He reports that he presented as shortness of breath in the past.  He has not had angina recently, although this was occurring when he would climb a hill (he has been less active due to previous knee pain recently).  In 2009, he received drug eluting Cypher stents to the LAD and the circumflex coronary arteries. Comorbidity includes systemic hypertension and hyperlipidemia both of which are adequately treated. By echocardiography was recently in December 2019, he has preserved left ventricular systolic function and no valvular abnormalities. He has not had catheterization since 2009.  His nuclear stress test in December 2019  showed scar without ischemia and was a low risk study, EF 49%.   Past Medical History:  Diagnosis Date  . Adenomatous colon polyp 2001  . Anxiety   . Atrial fibrillation ([place])    takes Coumadin daily  . BPH (benign prostatic hyperplasia)    takes Proscar daily  . CAD (coronary artery disease) 2009   cardiac cath and PTCA by Dr. Tami Ribas  . Carpal tunnel syndrome    right  . Complication of anesthesia    hard to wake up  . Diverticulosis 2007   tcs by Dr. Laural Golden  . GERD (gastroesophageal reflux disease)   . Gout   . Hypercholesterolemia    takes Atorvastatin daily  . Hypertension    takes Imdur,Coreg,and Lisinopril daily  . Hypothyroidism    takes SYnthroid daily  . Joint pain   . Joint swelling   . Nocturia   . Numbness    right hand pointer and middle finger  . Osteoarthritis    left knee  . Pneumonia    many,many yrs ago  . Tubular adenoma 2001    Past Surgical History:  Procedure Laterality Date  . Barbie Banner OSTEOTOMY Right 03/17/2013   Procedure: Barbie Banner OSTEOTOMY;  Surgeon: Marcheta Grammes, DPM;  Location: [facility];  Service: Orthopedics;  Laterality: Right;  . BUNIONECTOMY Right 03/17/2013   Procedure: BUNIONECTOMY, Arthroplasty 2nd toe right foot;  Surgeon: Marcheta Grammes, DPM;  Location: [facility];  Service: Orthopedics;  Laterality:  Right;  Marland Kitchen CARDIAC CATHETERIZATION  06/11/2007   PTCA X2 in 06/2007:  2 overlapping Cypher stents to LAD (2.5x13 and 3.0x23)  . CARDIAC CATHETERIZATION  06/21/2007   Cypher stent 2.5 x 13  to OM branch  . CARDIOVERSION  12/12/2011   Procedure: CARDIOVERSION;  Surgeon: Sanda Klein, MD;  [facility];  Service: Cardiovascular;  Laterality: N/A;  . CATARACT EXTRACTION     bilateral  . COLONOSCOPY  2007   Dr. Laural Golden- L sided diverticulosis. Next TCS 2012 due to h/o tubular adenoma.  . COLONOSCOPY  02/26/2011   Procedure: COLONOSCOPY;  Surgeon: Daneil Dolin, MD;  [facility];  Service: Endoscopy;   Laterality: N/A;  11:05  . CORONARY ANGIOPLASTY     x 3   . FLEXOR TENOTOMY Left 03/17/2013   Procedure: PERCUTANEOUS FLEXOR TENOTOMY 3RD TOE LEFT FOOT;  Surgeon: Marcheta Grammes, DPM;  Location: [facility];  Service: Orthopedics;  Laterality: Left;  . HERNIA REPAIR  2009   left inguinal  . rot Left   . TEE WITHOUT CARDIOVERSION  12/12/2011   Procedure: TRANSESOPHAGEAL ECHOCARDIOGRAM (TEE);  Surgeon: Sanda Klein, MD;  Location: [facility] ENDOSCOPY;  Service: Cardiovascular;  Laterality: N/A;   successful/sinus rhythm  . TOTAL KNEE ARTHROPLASTY Left 06/14/2013   Procedure: TOTAL KNEE ARTHROPLASTY;  Surgeon: Garald Balding, MD;  Location: [place];  Service: Orthopedics;  Laterality: Left;    Outpatient Medications Prior to Visit  Medication Sig Dispense Refill  . albuterol (PROVENTIL) (2.5 MG/3ML) 0.083% nebulizer solution Take 3 mLs (2.5 mg total) by nebulization every 6 (six) hours as needed for wheezing or shortness of breath. 75 mL 12  . Calcium Carbonate-Vitamin D (CALCIUM + D) 600-200 MG-UNIT TABS Take 1 tablet by mouth 2 (two) times daily.     . carvedilol (COREG) 25 MG tablet Take 1 tablet (25 mg total) by mouth 2 (two) times daily with a meal. 180 tablet 3  . finasteride (PROSCAR) 5 MG tablet Take 1 tablet by mouth daily.    . furosemide (LASIX) 20 MG tablet TAKE (1) TABLET BY MOUTH ONCE DAILY. 30 tablet 6  . Glucosamine 750 MG TABS Take 750 mg by mouth 2 (two) times daily.    . isosorbide mononitrate (IMDUR) 60 MG 24 hr tablet Take 1 tablet (60 mg total) by mouth daily. 90 tablet 3  . levothyroxine (SYNTHROID) 150 MCG tablet Take 1 tablet (150 mcg total) by mouth every morning. 90 tablet 0  . lisinopril (ZESTRIL) 20 MG tablet Take 1 tablet (20 mg total) by mouth daily. 90 tablet 3  . Multiple Vitamin (MULTIVITAMIN) capsule Take 1 capsule by mouth every morning.     . Nebulizer MISC 1 Units by Does not apply route daily. 1 each 0  . nitroGLYCERIN (NITROSTAT) 0.4 MG SL tablet DISSOLVE  1 TABLET UNDER TONGUE EVERY 5 MINUTES UP TO 15 MIN FOR CHESTPAIN. IF NO RELIEF CALL 911. (Patient taking differently: Place 0.4 mg under the tongue every 5 (five) minutes as needed for chest pain. ) 25 tablet 6  . rosuvastatin (CRESTOR) 10 MG tablet Take 1 tablet (10 mg total) by mouth every Sunday. 12 tablet 3  . warfarin (COUMADIN) 5 MG tablet TAKE 1 TABLET BY MOUTH DAILY OR AS DIRECTED BY COUMADIN CLINIC. 90 tablet 0  . aspirin EC 81 MG tablet Take 1 tablet (81 mg total) by mouth daily with breakfast. 30 tablet 1  . levothyroxine (SYNTHROID, LEVOTHROID) 150 MCG tablet Take 150 mcg by  mouth every morning.     No facility-administered medications prior to visit.      Allergies:   Carbapenems, Cephalosporins, and Penicillins   Social History   Socioeconomic History  . Marital status: Married    Spouse name: Not on file  . Number of children: 4  . Years of education: Not on file  . Highest education level: Not on file  Occupational History  . Occupation: retired  Scientific laboratory technician  . Financial resource strain: Not on file  . Food insecurity    Worry: Not on file    Inability: Not on file  . Transportation needs    Medical: Not on file    Non-medical: Not on file  Tobacco Use  . Smoking status: Never Smoker  . Smokeless tobacco: Never Used  Substance and Sexual Activity  . Alcohol use: No  . Drug use: No  . Sexual activity: Not on file  Lifestyle  . Physical activity    Days per week: Not on file    Minutes per session: Not on file  . Stress: Not on file  Relationships  . Social Herbalist on phone: Not on file    Gets together: Not on file    Attends religious service: Not on file    Active member of club or organization: Not on file    Attends meetings of clubs or organizations: Not on file    Relationship status: Not on file  Other Topics Concern  . Not on file  Social History Narrative  . Not on file     Family History:  The patient's family history  includes Heart disease in his brother; Pneumonia in his father.   ROS:   Please see the history of present illness.    ROS All other systems are reviewed and are negative.   PHYSICAL EXAM:   VS:  BP (!) 144/85   Pulse 77   Temp (!) 97.2 F (36.2 C)   Ht 5\' 8"  (1.727 m)   Wt 197 lb 4.8 oz (89.5 kg)   SpO2 98%   BMI 30.00 kg/m      General: Alert, oriented x3, no distress, appears well, smiling Head: no evidence of trauma, PERRL, EOMI, no exophtalmos or lid lag, no myxedema, no xanthelasma; normal ears, nose and oropharynx Neck: normal jugular venous pulsations and no hepatojugular reflux; brisk carotid pulses without delay and no carotid bruits Chest: clear to auscultation, no signs of consolidation by percussion or palpation, normal fremitus, symmetrical and full respiratory excursions Cardiovascular: normal position and quality of the apical impulse, irregular rhythm, normal first and widely split second heart sounds, faint aortic ejection murmur no diastolic murmurs, rubs or gallops Abdomen: no tenderness or distention, no masses by palpation, no abnormal pulsatility or arterial bruits, normal bowel sounds, no hepatosplenomegaly Extremities: no clubbing, cyanosis or edema; 2+ radial, ulnar and brachial pulses bilaterally; 2+ right femoral, posterior tibial and dorsalis pedis pulses; 2+ left femoral, posterior tibial and dorsalis pedis pulses; no subclavian or femoral bruits Neurological: grossly nonfocal Psych: Normal mood and affect    Wt Readings from Last 3 Encounters:  03/10/19 197 lb 4.8 oz (89.5 kg)  08/16/18 194 lb (88 kg)  05/27/18 207 lb (93.9 kg)      Studies/Labs Reviewed:   Nuclear stress test 03/25/2018:   There was no ST segment deviation noted during stress.  Findings consistent with prior inferior/apical myocardial infarction without current ischemia  This is an intermediate risk  study. Risk based on decreased LVEF, no current myocardium at jeopardy.  Correlate LVEF with echo.  The left ventricular ejection fraction is mildly decreased (49%).    ECHO 03/25/2018:  - Left ventricle: The cavity size was normal. Wall thickness was   increased in a pattern of moderate LVH. Systolic function was   normal. The estimated ejection fraction was in the range of 60%   to 65%. Wall motion was normal; there were no regional wall   motion abnormalities. The study is not technically sufficient to   allow evaluation of LV diastolic function. - Aortic valve: Moderately calcified annulus. Trileaflet;   moderately thickened leaflets. Valve area (VTI): 1.79 cm^2. Valve   area (Vmax): 1.79 cm^2. Valve area (Vmean): 1.76 cm^2. - Mitral valve: Mildly calcified annulus. Mildly thickened leaflets   . There was mild regurgitation. - Left atrium: The atrium was moderately dilated. - Right atrium: The atrium was moderately dilated.  EKG:  EKG is ordered today.  It shows rate controlled atrial fibrillation, chronic right bundle branch block and left anterior fascicular block, QTC 477 ms  BMET    Component Value Date/Time   NA 143 06/01/2018 1048   NA 140 03/19/2018 1423   K 4.0 06/01/2018 1048   CL 107 06/01/2018 1048   CO2 29 06/01/2018 1048   GLUCOSE 89 06/01/2018 1048   BUN 29 (H) 06/01/2018 1048   BUN 23 03/19/2018 1423   CREATININE 1.01 06/01/2018 1048   CALCIUM 8.6 06/01/2018 1048   GFRNONAA >60 05/02/2018 0634   GFRAA >60 05/02/2018 0634     Lipid Panel    Component Value Date/Time   CHOL 117 08/27/2017 0820   TRIG 83 08/27/2017 0820   HDL 34 (L) 08/27/2017 0820   CHOLHDL 3.4 08/27/2017 0820   CHOLHDL 3.2 06/12/2016 0929   VLDL 16 06/12/2016 0929   LDLCALC 66 08/27/2017 0820   Labs April 2019 Total cholesterol 139, triglycerides 148, HDL 40, LDL 69 Creatinine 1.3, normal liver function tests  ASSESSMENT:    1. Chronic diastolic heart failure ([place])   2. Longstanding persistent atrial fibrillation ([place])   3. Coronary artery  disease involving native coronary artery of native heart without angina pectoris   4. Trifascicular block   5. Dyslipidemia   6. Essential hypertension   7. Long term current use of anticoagulant therapy      PLAN:  In order of problems listed above:  1. CHF: Medically euvolemic, NYHA functional class II.  Carefully monitoring his sodium intake and his weight and maintaining this within our target range.  Seems to have a narrow volume range for compensation (<190 has orthostatic hypotension, >197 has dyspnea).  Normal LVEF by Dec 2019 echocardiogram.   2. CAD: Asymptomatic, although this may be related to reduced ability to exercise.   His knee prevents him from exercising on the treadmill but he is using a stationary bicycle.  He used to enjoy going to the swimming pool, but cannot do so during the pandemic.Low risk Lexiscan Myoview December 2019. 3. AFib: Rate controlled and no longer appears to be symptomatic.  Odds of return to long-term sinus rhythm are low due to atrial dilation.  Tolerating anticoagulation without overt bleeding problems.  CHADSVasc score is 4 (age 81, HTN, CAD, CHF). He has never had a stroke or TIA.  4. Trifascicular block: Denies problems with dizziness or syncope.  Good progression to complete heart block (When in SR had long PR as well as RBBB and left  axis deviation). No recent syncope or bradycardia to suggest higher grade AV block. Avoid adding any other negative chronotropic or antiarrhythmics because of the conduction abnormalities.   5. HLP: On statin with most lipid parameters at target.  LDL is less than 70.  HDL unlikely to improve without weight loss. 6. HTN: Excellent control. 7. Warfarin anticoagulation: Well-tolerated without bleeding complications.  Borderline anemia noticed on labs from May and reduced transferrin saturation on labs from January 2020.  Need to monitor for development of worsening anemia.     Medication Adjustments/Labs and Tests  Ordered: Current medicines are reviewed at length with the patient today.  Concerns regarding medicines are outlined above.  Medication changes, Labs and Tests ordered today are listed in the Patient Instructions below. Patient Instructions  Medication Instructions:  No changes  *If you need a refill on your cardiac medications before your next appointment, please call your pharmacy*  Lab Work: None ordered If you have labs (blood work) drawn today and your tests are completely normal, you will receive your results only by: Marland Kitchen MyChart Message (if you have MyChart) OR . A paper copy in the mail If you have any lab test that is abnormal or we need to change your treatment, we will call you to review the results.  Testing/Procedures: None ordered  Follow-Up: At [facility], you and your health needs are our priority.  As part of our continuing mission to provide you with exceptional heart care, we have created designated Provider Care Teams.  These Care Teams include your primary Cardiologist (physician) and Advanced Practice Providers (APPs -  Physician Assistants and Nurse Practitioners) who all work together to provide you with the care you need, when you need it.  Your next appointment:   Follow up in 6 months with Bernerd Pho, [facility] (in [place]) Follow up in 12 months with Dr. Sallyanne Kuster Signed, Sanda Klein, MD  03/12/2019 1:14 PM    [address], [place], [place]  [id number] Phone: [phone]; Fax: [phone]

## 2019-03-12 ENCOUNTER — Encounter: Payer: Self-pay | Admitting: Cardiovascular Disease

## 2019-03-21 ENCOUNTER — Ambulatory Visit (INDEPENDENT_AMBULATORY_CARE_PROVIDER_SITE_OTHER): Payer: Medicare Other | Admitting: *Deleted

## 2019-03-21 ENCOUNTER — Other Ambulatory Visit: Payer: Self-pay

## 2019-03-21 DIAGNOSIS — Z5181 Encounter for therapeutic drug level monitoring: Secondary | ICD-10-CM

## 2019-03-21 DIAGNOSIS — I4891 Unspecified atrial fibrillation: Secondary | ICD-10-CM | POA: Diagnosis not present

## 2019-03-21 LAB — POCT INR: INR: 2.2 (ref 2.0–3.0)

## 2019-03-21 NOTE — Patient Instructions (Signed)
Continue warfarin 1 tablet daily except 1/2 tablet on Tuesdays, Thursdays and Saturdays Recheck in 6 weeks

## 2019-04-05 ENCOUNTER — Telehealth: Payer: Self-pay | Admitting: Cardiovascular Disease

## 2019-04-05 NOTE — Telephone Encounter (Signed)
Spoke to patient who reports he had a tramadol Rx from a previous hospitalization. He pharmacist filled it for him but he has no refills and he wanted Dr. Loletha Grayer to fill it. Advised this is not on his med list and not a cardiac medication. Suggested he contact PCP for refills

## 2019-04-05 NOTE — Telephone Encounter (Signed)
Patient is requesting a refill of Tramadol prescription. Tramadol is not listed as one of the patient's medications. Please advise.

## 2019-04-07 ENCOUNTER — Other Ambulatory Visit: Payer: Self-pay | Admitting: Cardiovascular Disease

## 2019-04-07 ENCOUNTER — Other Ambulatory Visit: Payer: Self-pay

## 2019-04-16 ENCOUNTER — Other Ambulatory Visit: Payer: Self-pay | Admitting: Cardiovascular Disease

## 2019-04-22 ENCOUNTER — Telehealth: Payer: Self-pay | Admitting: Student

## 2019-04-22 NOTE — Telephone Encounter (Signed)
error 

## 2019-05-02 ENCOUNTER — Other Ambulatory Visit: Payer: Self-pay

## 2019-05-02 ENCOUNTER — Ambulatory Visit (INDEPENDENT_AMBULATORY_CARE_PROVIDER_SITE_OTHER): Payer: Medicare Other | Admitting: *Deleted

## 2019-05-02 DIAGNOSIS — Z5181 Encounter for therapeutic drug level monitoring: Secondary | ICD-10-CM | POA: Diagnosis not present

## 2019-05-02 DIAGNOSIS — I4891 Unspecified atrial fibrillation: Secondary | ICD-10-CM | POA: Diagnosis not present

## 2019-05-02 LAB — POCT INR: INR: 1.9 — AB (ref 2.0–3.0)

## 2019-05-02 NOTE — Patient Instructions (Signed)
Take 1 1/2 tablets tonight then resume 1 tablet daily except 1/2 tablet on Tuesdays, Thursdays and Saturdays Recheck in 6 weeks

## 2019-05-04 ENCOUNTER — Ambulatory Visit: Payer: Self-pay

## 2019-05-04 ENCOUNTER — Ambulatory Visit (INDEPENDENT_AMBULATORY_CARE_PROVIDER_SITE_OTHER): Payer: Medicare Other | Admitting: Orthopaedic Surgery

## 2019-05-04 ENCOUNTER — Encounter: Payer: Self-pay | Admitting: Orthopaedic Surgery

## 2019-05-04 VITALS — Ht 70.0 in | Wt 190.0 lb

## 2019-05-04 DIAGNOSIS — G8929 Other chronic pain: Secondary | ICD-10-CM

## 2019-05-04 DIAGNOSIS — M170 Bilateral primary osteoarthritis of knee: Secondary | ICD-10-CM | POA: Insufficient documentation

## 2019-05-04 DIAGNOSIS — M1711 Unilateral primary osteoarthritis, right knee: Secondary | ICD-10-CM | POA: Diagnosis not present

## 2019-05-04 DIAGNOSIS — M1712 Unilateral primary osteoarthritis, left knee: Secondary | ICD-10-CM

## 2019-05-04 MED ORDER — METHYLPREDNISOLONE ACETATE 40 MG/ML IJ SUSP
80.0000 mg | INTRAMUSCULAR | Status: AC | PRN
  Administered 2019-05-04: 12:00:00 80 mg via INTRA_ARTICULAR

## 2019-05-04 MED ORDER — LIDOCAINE HCL 1 % IJ SOLN
2.0000 mL | INTRAMUSCULAR | Status: AC | PRN
  Administered 2019-05-04: 12:00:00 2 mL

## 2019-05-04 MED ORDER — BUPIVACAINE HCL 0.5 % IJ SOLN
2.0000 mL | INTRAMUSCULAR | Status: AC | PRN
  Administered 2019-05-04: 12:00:00 2 mL via INTRA_ARTICULAR

## 2019-05-04 NOTE — Progress Notes (Signed)
Office Visit Note   Patient: Jerry Cain           Date of Birth: 11-11-1931           MRN: [id number] Visit Date: 05/04/2019              Requested by: No referring provider defined for this encounter. PCP: Patient, No Pcp Per   Assessment & Plan: Visit Diagnoses:  1. Chronic pain of right knee   2. Bilateral primary osteoarthritis of knee     Plan: End-stage osteoarthritis right knee.  Approximately 12 years status post left total knee replacement and doing well.  Long discussion regarding the arthritis in his right knee.  Jerry Cain would like to have a cortisone injection rather than pursue any other option at this point  Follow-Up Instructions: Return if symptoms worsen or fail to improve.   Orders:  Orders Placed This Encounter  Procedures  . Large Joint Inj: R knee  . XR KNEE 3 VIEW RIGHT   No orders of the defined types were placed in this encounter.     Procedures: Large Joint Inj: R knee on 05/04/2019 11:41 AM Indications: pain and diagnostic evaluation Details: 25 G 1.5 in needle, anteromedial approach  Arthrogram: No  Medications: 2 mL lidocaine 1 %; 2 mL bupivacaine 0.5 %; 80 mg methylPREDNISolone acetate 40 MG/ML Procedure, treatment alternatives, risks and benefits explained, specific risks discussed. Consent was given by the patient. Immediately prior to procedure a time out was called to verify the correct patient, procedure, equipment, support staff and site/side marked as required. Patient was prepped and draped in the usual sterile fashion.       Clinical Data: No additional findings.   Subjective: Chief Complaint  Patient presents with  . Right Knee - Pain  . Right Foot - Pain  Patient presents today with right knee and right foot pain. He has had chronic right knee pain and seen Jerry Cain before for this. He said that the toes on his right foot hurt and stay sore. No known injury. His right foot has been hurting for a couple  months. He thought it was gout. He has changed his diet and states that it does not hurt anymore.  Has had prior left total knee replacement without any problems.  Describes the pain in his right knee is catching and aching with associated occasional swelling.  No obvious compromise of his activities  HPI  Review of Systems   Objective: Vital Signs: Ht 5\' 10"  (1.778 m)   Wt 190 lb (86.2 kg)   BMI 27.26 kg/m   Physical Exam Constitutional:      Appearance: He is well-developed.  Eyes:     Pupils: Pupils are equal, round, and reactive to light.  Pulmonary:     Effort: Pulmonary effort is normal.  Skin:    General: Skin is warm and dry.  Neurological:     Mental Status: He is alert and oriented to person, place, and time.  Psychiatric:        Behavior: Behavior normal.     Ortho Exam awake alert and oriented.  Hard of hearing.  Right knee with positive effusion.  He might have very minimal varus.  Patella crepitation but no pain with compression.  No opening with a varus or valgus stress.  Flexed over 105 degrees with some venous stasis changes distally.  Mild medial joint pain  Specialty Comments:  No specialty comments available.  Imaging: XR KNEE  3 VIEW RIGHT  Result Date: 05/04/2019 Films of the right knee were obtained in 3 projections and demonstrate advanced, end-stage osteoarthritis.  Both medial lateral joint spaces are significantly narrowed with subchondral sclerosis and peripheral osteophytes suitable arthritis about the patellofemoral joint as well.  No acute changes    PMFS History: Patient Active Problem List   Diagnosis Date Noted  . Bilateral primary osteoarthritis of knee 05/04/2019  . CHF (congestive heart failure) ([place]) 05/02/2018  . Acute respiratory failure with hypoxia ([place]) 05/01/2018  . Reactive airway disease 05/01/2018  . AKI (acute kidney injury) ([place]) 05/01/2018  . Acute exacerbation of CHF (congestive heart failure) ([place]) 04/30/2018  .  Acute on chronic diastolic heart failure ([place]) 08/16/2016  . 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 Arthritis of knee, left 06/16/2013  . S/P total knee replacement using cement 06/14/2013  . Osteoarthritis of left knee 03/21/2013  . BPH (benign prostatic hyperplasia)   . GERD (gastroesophageal reflux disease)   . CAD (coronary artery disease)   . Fever 03/20/2013  . Altered mental state 03/20/2013  . Postoperative fever 03/20/2013  . PNA (pneumonia) 03/20/2013  . Preop cardiovascular exam 11/24/2012  . Peroneal tendonitis 10/05/2012  . Arthritis of foot, degenerative 10/05/2012  . HTN (hypertension) 09/25/2012  . Hypercholesterolemia 09/25/2012  . Atrial fibrillation ([place]) 06/15/2012  . Long term current use of anticoagulant therapy 06/15/2012  . Rotator cuff strain 02/04/2012  . Shoulder pain 02/04/2012  . History of colonic polyps 01/29/2011  . High risk medication use 01/29/2011   Past Medical History:  Diagnosis Date  . Adenomatous colon polyp 2001  . Anxiety   . Atrial fibrillation ([place])    takes Coumadin daily  . BPH (benign prostatic hyperplasia)    takes Proscar daily  . CAD (coronary artery disease) 2009   cardiac cath and PTCA by Dr. Tami Ribas  . Carpal tunnel syndrome    right  . Complication of anesthesia    hard to wake up  . Diverticulosis 2007   tcs by Dr. Laural Golden  . GERD (gastroesophageal reflux disease)   . Gout   . Hypercholesterolemia    takes Atorvastatin daily  . Hypertension    takes Imdur,Coreg,and Lisinopril daily  . Hypothyroidism    takes SYnthroid daily  . Joint pain   . Joint swelling   . Nocturia   . Numbness    right hand pointer and middle finger  . Osteoarthritis    left knee  . Pneumonia    many,many yrs ago  . Tubular adenoma 2001    Family History  Problem Relation Age of Onset  . Pneumonia Father   . Heart disease Brother        open heart surgery at age 75  . Colon cancer Neg Hx     Past Surgical History:  Procedure  Laterality Date  . Barbie Banner OSTEOTOMY Right 03/17/2013   Procedure: Barbie Banner OSTEOTOMY;  Surgeon: Marcheta Grammes, DPM;  Location: [facility];  Service: Orthopedics;  Laterality: Right;  . BUNIONECTOMY Right 03/17/2013   Procedure: BUNIONECTOMY, Arthroplasty 2nd toe right foot;  Surgeon: Marcheta Grammes, DPM;  Location: [facility];  Service: Orthopedics;  Laterality: Right;  . CARDIAC CATHETERIZATION  06/11/2007   PTCA X2 in 06/2007:  2 overlapping Cypher stents to LAD (2.5x13 and 3.0x23)  . CARDIAC CATHETERIZATION  06/21/2007   Cypher stent 2.5 x 13  to OM branch  . CARDIOVERSION  12/12/2011   Procedure: CARDIOVERSION;  Surgeon: Sanda Klein, MD;  [facility];  Service: Cardiovascular;  Laterality: N/A;  . CATARACT EXTRACTION     bilateral  . COLONOSCOPY  2007   Dr. Laural Golden- L sided diverticulosis. Next TCS 2012 due to h/o tubular adenoma.  . COLONOSCOPY  02/26/2011   Procedure: COLONOSCOPY;  Surgeon: Daneil Dolin, MD;  [facility];  Service: Endoscopy;  Laterality: N/A;  11:05  . CORONARY ANGIOPLASTY     x 3   . FLEXOR TENOTOMY Left 03/17/2013   Procedure: PERCUTANEOUS FLEXOR TENOTOMY 3RD TOE LEFT FOOT;  Surgeon: Marcheta Grammes, DPM;  Location: [facility];  Service: Orthopedics;  Laterality: Left;  . HERNIA REPAIR  2009   left inguinal  . rot Left   . TEE WITHOUT CARDIOVERSION  12/12/2011   Procedure: TRANSESOPHAGEAL ECHOCARDIOGRAM (TEE);  Surgeon: Sanda Klein, MD;  Location: [facility] ENDOSCOPY;  Service: Cardiovascular;  Laterality: N/A;   successful/sinus rhythm  . TOTAL KNEE ARTHROPLASTY Left 06/14/2013   Procedure: TOTAL KNEE ARTHROPLASTY;  Surgeon: Garald Balding, MD;  Location: [place] [facility];  Service: Orthopedics;  Laterality: Left;   Social History   Occupational History  . Occupation: retired  Tobacco Use  . Smoking status: Never Smoker  . Smokeless tobacco: Never Used  Substance and Sexual Activity  . Alcohol use: No  . Drug use: No  . Sexual  activity: Not on file

## 2019-05-10 ENCOUNTER — Telehealth: Payer: Self-pay | Admitting: Internal Medicine

## 2019-05-10 NOTE — Telephone Encounter (Signed)
Received call this evening from [name] regarding elevated blood pressure.   As noted by Dr. Sallyanne Kuster, [name] meticulously tracks his blood pressure and salt intake. This morning he missed his medications (including carvedilol 12.5mg  and Imdur 60mg ) and his blood pressure this evening has been ranging from the 180s-190s/90s-100 on repeat measurements. He just took his evening dose carvedilol and lisinopril 20mg  about an hour ago, as usual. His blood pressure until today has been under excellent control. He feels well without any chest pain, headache, dyspnea, etc. He denies any dietary indiscretion. I instructed him to take his Imdur late today and resume his medication regimen as usual tomorrow morning. He will call if his blood pressure remains elevated tomorrow by noon, or if he does develop any new symptoms.

## 2019-05-11 NOTE — Telephone Encounter (Signed)
Those events are pretty typical when abruptly stopping a beta-blocker (carvedilol).  This can cause "rebound" elevation in heart rate and blood pressure.  Suspect things will be back to normal as he takes his medications regularly.

## 2019-05-11 NOTE — Telephone Encounter (Signed)
Left a message for the patient to call back.  

## 2019-05-11 NOTE — Telephone Encounter (Signed)
The patient has been made aware. He stated that his blood pressure this morning was 125/71 and heart rate 73. He has been advised to call if anything further was needed.

## 2019-05-24 ENCOUNTER — Other Ambulatory Visit: Payer: Self-pay

## 2019-05-24 DIAGNOSIS — N400 Enlarged prostate without lower urinary tract symptoms: Secondary | ICD-10-CM

## 2019-05-24 MED ORDER — FINASTERIDE 5 MG PO TABS: 5.0000 mg | ORAL_TABLET | Freq: Every day | ORAL | 1 refills | Status: DC

## 2019-06-04 ENCOUNTER — Other Ambulatory Visit: Payer: Self-pay | Admitting: Student

## 2019-06-10 ENCOUNTER — Other Ambulatory Visit: Payer: Self-pay | Admitting: Cardiovascular Disease

## 2019-06-13 ENCOUNTER — Ambulatory Visit (INDEPENDENT_AMBULATORY_CARE_PROVIDER_SITE_OTHER): Payer: Medicare Other | Admitting: *Deleted

## 2019-06-13 ENCOUNTER — Other Ambulatory Visit: Payer: Self-pay

## 2019-06-13 DIAGNOSIS — I4891 Unspecified atrial fibrillation: Secondary | ICD-10-CM | POA: Diagnosis not present

## 2019-06-13 DIAGNOSIS — Z5181 Encounter for therapeutic drug level monitoring: Secondary | ICD-10-CM

## 2019-06-13 LAB — POCT INR: INR: 2.5 (ref 2.0–3.0)

## 2019-06-13 NOTE — Patient Instructions (Signed)
Continue warfarin 1 tablet daily except 1/2 tablet on Tuesdays, Thursdays and Saturdays Recheck in 6 weeks

## 2019-06-17 ENCOUNTER — Other Ambulatory Visit: Payer: Self-pay | Admitting: Cardiovascular Disease

## 2019-06-24 ENCOUNTER — Other Ambulatory Visit: Payer: Self-pay | Admitting: Cardiovascular Disease

## 2019-07-11 ENCOUNTER — Other Ambulatory Visit: Payer: Self-pay

## 2019-07-11 DIAGNOSIS — N400 Enlarged prostate without lower urinary tract symptoms: Secondary | ICD-10-CM

## 2019-07-11 MED ORDER — FINASTERIDE 5 MG PO TABS: 5.0000 mg | ORAL_TABLET | Freq: Every day | ORAL | 1 refills | Status: DC

## 2019-07-15 ENCOUNTER — Other Ambulatory Visit: Payer: Self-pay | Admitting: Cardiovascular Disease

## 2019-07-25 ENCOUNTER — Ambulatory Visit (INDEPENDENT_AMBULATORY_CARE_PROVIDER_SITE_OTHER): Payer: Medicare Other | Admitting: *Deleted

## 2019-07-25 ENCOUNTER — Other Ambulatory Visit: Payer: Self-pay

## 2019-07-25 DIAGNOSIS — Z5181 Encounter for therapeutic drug level monitoring: Secondary | ICD-10-CM

## 2019-07-25 DIAGNOSIS — I4891 Unspecified atrial fibrillation: Secondary | ICD-10-CM | POA: Diagnosis not present

## 2019-07-25 LAB — POCT INR: INR: 2.4 (ref 2.0–3.0)

## 2019-07-25 NOTE — Patient Instructions (Signed)
Continue warfarin 1 tablet daily except 1/2 tablet on Tuesdays, Thursdays and Saturdays Recheck in 6 weeks

## 2019-07-29 ENCOUNTER — Ambulatory Visit (INDEPENDENT_AMBULATORY_CARE_PROVIDER_SITE_OTHER): Payer: Medicare Other | Admitting: Urology

## 2019-07-29 ENCOUNTER — Other Ambulatory Visit: Payer: Self-pay

## 2019-07-29 VITALS — BP 127/77 | HR 77 | Temp 97.9°F | Ht 71.0 in | Wt 189.0 lb

## 2019-07-29 DIAGNOSIS — N138 Other obstructive and reflux uropathy: Secondary | ICD-10-CM

## 2019-07-29 DIAGNOSIS — N400 Enlarged prostate without lower urinary tract symptoms: Secondary | ICD-10-CM

## 2019-07-29 DIAGNOSIS — R351 Nocturia: Secondary | ICD-10-CM | POA: Diagnosis not present

## 2019-07-29 DIAGNOSIS — N401 Enlarged prostate with lower urinary tract symptoms: Secondary | ICD-10-CM | POA: Diagnosis not present

## 2019-07-29 MED ORDER — FINASTERIDE 5 MG PO TABS: 5.0000 mg | ORAL_TABLET | Freq: Every day | ORAL | 3 refills | Status: DC

## 2019-07-29 NOTE — Progress Notes (Signed)
Subjective:  1. BPH with urinary obstruction   2. Nocturia   3. Benign prostatic hyperplasia, unspecified whether lower urinary tract symptoms present      Jerry Cain is a 86 year-old male established patient who is here for follow up regarding further evaluation of BPH and lower urinary tract symptoms.  The patient complains of lower urinary tract symptom(s) that include nocturia and sense of incomplete emptying. The patient states his most bothersome symptom(s) are the following: nocturia. His symptoms have been stable over the last year.   Jerry Cain returns today in f/u. He has a several year history of BPH with BOO with an elevated PSA. He remains on finasteride. He remains off of alpha blockers which caused side effects. He is on prn furosemide and voids well on that. He has varialble nocturia 3-5x. His IPSS is 8. The urgency is only aggravated by the furosemide. His PSA was 1.1 on the finasteride a year ago and we decided further testing was not indicated. He has no hesitancy and a good stream. He feels like he is emptying well most of the time. He has no hematuria or dysuria. He has a history of  a negative biopsy remotely for the elevated PSA. He has no associated signs or symptoms.    IPSS    Row Name 07/29/19 1100         International Prostate Symptom Score   How often have you had the sensation of not emptying your bladder?  About half the time     How often have you had to urinate less than every two hours?  Less than 1 in 5 times     How often have you found you stopped and started again several times when you urinated?  Not at All     How often have you found it difficult to postpone urination?  Less than 1 in 5 times     How often have you had a weak urinary stream?  Not at All     How often have you had to strain to start urination?  Not at All     How many times did you typically get up at night to urinate?  3 Times     Total IPSS Score  8       Quality  of Life due to urinary symptoms   If you were to spend the rest of your life with your urinary condition just the way it is now how would you feel about that?  Pleased         ROS:  ROS:  A complete review of systems was performed.  All systems are negative except for pertinent findings as noted.   Review of Systems  Musculoskeletal:       Toe pain   Neurological: Positive for dizziness.    Allergies  Allergen Reactions  . Carbapenems Other (See Comments)    Altered mental status  . Cephalosporins     Unknown reaction  . Penicillins Swelling and Other (See Comments)    Did it involve swelling of the face/tongue/throat, SOB, or low BP? Unknown-possible swelling Did it involve sudden or severe rash/hives, skin peeling, or any reaction on the inside of your mouth or nose? Unknown Did you need to seek medical attention at a hospital or doctor's office? Unknown When did it last happen?Over 10 years (possible swelling) If all above answers are "NO", may proceed with cephalosporin use.     Outpatient Encounter Medications as  of 07/29/2019  Medication Sig  . albuterol (PROVENTIL) (2.5 MG/3ML) 0.083% nebulizer solution USE 1 VIAL IN NEBULIZER EVERY 6 HOURS AS NEEDED.  . Calcium Carbonate-Vitamin D (CALCIUM + D) 600-200 MG-UNIT TABS Take 1 tablet by mouth 2 (two) times daily.   . carvedilol (COREG) 25 MG tablet Take 1 tablet (25 mg total) by mouth 2 (two) times daily with a meal.  . finasteride (PROSCAR) 5 MG tablet Take 1 tablet (5 mg total) by mouth daily. Please call the office to schedule office visit for any future refills. Yearly overdue  . furosemide (LASIX) 20 MG tablet TAKE (1) TABLET BY MOUTH ONCE DAILY.  Marland Kitchen Glucosamine 750 MG TABS Take 750 mg by mouth 2 (two) times daily.  . isosorbide mononitrate (IMDUR) 60 MG 24 hr tablet TAKE ONE TABLET ONCE DAILY IN THE MORNING.  Marland Kitchen levothyroxine (SYNTHROID) 150 MCG tablet TAKE (1) TABLET BY MOUTH ONCE DAILY.  Marland Kitchen lisinopril (ZESTRIL)  20 MG tablet Take 1 tablet (20 mg total) by mouth daily.  . Multiple Vitamin (MULTIVITAMIN) capsule Take 1 capsule by mouth every morning.   . Nebulizer MISC 1 Units by Does not apply route daily.  . nitroGLYCERIN (NITROSTAT) 0.4 MG SL tablet DISSOLVE 1 TABLET UNDER TONGUE EVERY 5 MINUTES UP TO 15 MIN FOR CHESTPAIN. IF NO RELIEF CALL 911. (Patient taking differently: Place 0.4 mg under the tongue every 5 (five) minutes as needed for chest pain. )  . rosuvastatin (CRESTOR) 10 MG tablet Take 1 tablet (10 mg total) by mouth daily.  . traMADol (ULTRAM) 50 MG tablet Take 50 mg by mouth 3 (three) times daily as needed.  . warfarin (COUMADIN) 5 MG tablet TAKE 1 TABLET BY MOUTH DAILY OR AS DIRECTED BY COUMADIN CLINIC.  . [DISCONTINUED] finasteride (PROSCAR) 5 MG tablet Take 1 tablet (5 mg total) by mouth daily. Please call the office to schedule office visit for any future refills. Yearly overdue   No facility-administered encounter medications on file as of 07/29/2019.    Past Medical History:  Diagnosis Date  . Adenomatous colon polyp 2001  . Anxiety   . Atrial fibrillation ([place])    takes Coumadin daily  . BPH (benign prostatic hyperplasia)    takes Proscar daily  . CAD (coronary artery disease) 2009   cardiac cath and PTCA by Dr. Tami Ribas  . Carpal tunnel syndrome    right  . Complication of anesthesia    hard to wake up  . Diverticulosis 2007   tcs by Dr. Laural Golden  . GERD (gastroesophageal reflux disease)   . Gout   . Hypercholesterolemia    takes Atorvastatin daily  . Hypertension    takes Imdur,Coreg,and Lisinopril daily  . Hypothyroidism    takes SYnthroid daily  . Joint pain   . Joint swelling   . Nocturia   . Numbness    right hand pointer and middle finger  . Osteoarthritis    left knee  . Pneumonia    many,many yrs ago  . Tubular adenoma 2001    Past Surgical History:  Procedure Laterality Date  . Barbie Banner OSTEOTOMY Right 03/17/2013   Procedure: Barbie Banner OSTEOTOMY;   Surgeon: Marcheta Grammes, DPM;  Location: [facility];  Service: Orthopedics;  Laterality: Right;  . BUNIONECTOMY Right 03/17/2013   Procedure: BUNIONECTOMY, Arthroplasty 2nd toe right foot;  Surgeon: Marcheta Grammes, DPM;  Location: [facility];  Service: Orthopedics;  Laterality: Right;  . CARDIAC CATHETERIZATION  06/11/2007   PTCA X2 in 06/2007:  2  overlapping Cypher stents to LAD (2.5x13 and 3.0x23)  . CARDIAC CATHETERIZATION  06/21/2007   Cypher stent 2.5 x 13  to OM branch  . CARDIOVERSION  12/12/2011   Procedure: CARDIOVERSION;  Surgeon: Sanda Klein, MD;  Location: [facility];  Service: Cardiovascular;  Laterality: N/A;  . CATARACT EXTRACTION     bilateral  . COLONOSCOPY  2007   Dr. Laural Golden- L sided diverticulosis. Next TCS 2012 due to h/o tubular adenoma.  . COLONOSCOPY  02/26/2011   Procedure: COLONOSCOPY;  Surgeon: Daneil Dolin, MD;  Location: [facility];  Service: Endoscopy;  Laterality: N/A;  11:05  . CORONARY ANGIOPLASTY     x 3   . FLEXOR TENOTOMY Left 03/17/2013   Procedure: PERCUTANEOUS FLEXOR TENOTOMY 3RD TOE LEFT FOOT;  Surgeon: Marcheta Grammes, DPM;  Location: [facility];  Service: Orthopedics;  Laterality: Left;  . HERNIA REPAIR  2009   left inguinal  . rot Left   . TEE WITHOUT CARDIOVERSION  12/12/2011   Procedure: TRANSESOPHAGEAL ECHOCARDIOGRAM (TEE);  Surgeon: Sanda Klein, MD;  Location: [facility];  Service: Cardiovascular;  Laterality: N/A;   successful/sinus rhythm  . TOTAL KNEE ARTHROPLASTY Left 06/14/2013   Procedure: TOTAL KNEE ARTHROPLASTY;  Surgeon: Garald Balding, MD;  Location: [place];  Service: Orthopedics;  Laterality: Left;    Social History   Socioeconomic History  . Marital status: Married    Spouse name: Not on file  . Number of children: 4  . Years of education: Not on file  . Highest education level: Not on file  Occupational History  . Occupation: retired  Tobacco Use  . Smoking status: Never Smoker  . Smokeless  tobacco: Never Used  Substance and Sexual Activity  . Alcohol use: No  . Drug use: No  . Sexual activity: Not on file  Other Topics Concern  . Not on file  Social History Narrative  . Not on file   Social Determinants of Health   Financial Resource Strain:   . Difficulty of Paying Living Expenses:   Food Insecurity:   . Worried About Charity fundraiser in the Last Year:   . Arboriculturist in the Last Year:   Transportation Needs:   . Film/video editor (Medical):   Marland Kitchen Lack of Transportation (Non-Medical):   Physical Activity:   . Days of Exercise per Week:   . Minutes of Exercise per Session:   Stress:   . Feeling of Stress :   Social Connections:   . Frequency of Communication with Friends and Family:   . Frequency of Social Gatherings with Friends and Family:   . Attends Religious Services:   . Active Member of Clubs or Organizations:   . Attends Archivist Meetings:   Marland Kitchen Marital Status:   Intimate Partner Violence:   . Fear of Current or Ex-Partner:   . Emotionally Abused:   Marland Kitchen Physically Abused:   . Sexually Abused:     Family History  Problem Relation Age of Onset  . Pneumonia Father   . Heart disease Brother        open heart surgery at age 84  . Colon cancer Neg Hx        Objective: Vitals:   07/29/19 1103  BP: 127/77  Pulse: 77  Temp: 97.9 F (36.6 C)     Physical Exam  Lab Results:  No results found for this or any previous visit (from the past 24 hour(s)).  BMET  No results for input(s): NA, K, CL, CO2, GLUCOSE, BUN, CREATININE, CALCIUM in the last 72 hours. PSA No results found for: PSA No results found for: TESTOSTERONE    Studies/Results: No results found.    Assessment & Plan: BPH with BOO.  He is doing well on finasteride which I have refilled. He will f/u in a year.    Meds ordered this encounter  Medications  . finasteride (PROSCAR) 5 MG tablet    Sig: Take 1 tablet (5 mg total) by mouth daily. Please call  the office to schedule office visit for any future refills. Yearly overdue    Dispense:  90 tablet    Refill:  3     No orders of the defined types were placed in this encounter.     Return in about 1 year (around 07/28/2020) for for f/u.   CC: Patient, No Pcp Per      Irine Seal 07/29/2019

## 2019-08-06 ENCOUNTER — Other Ambulatory Visit: Payer: Self-pay | Admitting: Student

## 2019-08-12 ENCOUNTER — Other Ambulatory Visit: Payer: Self-pay | Admitting: Cardiovascular Disease

## 2019-09-06 ENCOUNTER — Other Ambulatory Visit: Payer: Self-pay

## 2019-09-06 ENCOUNTER — Ambulatory Visit (INDEPENDENT_AMBULATORY_CARE_PROVIDER_SITE_OTHER): Payer: Medicare Other | Admitting: *Deleted

## 2019-09-06 DIAGNOSIS — Z5181 Encounter for therapeutic drug level monitoring: Secondary | ICD-10-CM | POA: Diagnosis not present

## 2019-09-06 DIAGNOSIS — I4891 Unspecified atrial fibrillation: Secondary | ICD-10-CM | POA: Diagnosis not present

## 2019-09-06 LAB — POCT INR: INR: 2.1 (ref 2.0–3.0)

## 2019-09-06 NOTE — Patient Instructions (Signed)
Continue warfarin 1 tablet daily except 1/2 tablet on Tuesdays, Thursdays and Saturdays Recheck in 6 weeks

## 2019-10-18 ENCOUNTER — Ambulatory Visit (INDEPENDENT_AMBULATORY_CARE_PROVIDER_SITE_OTHER): Payer: Medicare Other | Admitting: *Deleted

## 2019-10-18 DIAGNOSIS — Z5181 Encounter for therapeutic drug level monitoring: Secondary | ICD-10-CM | POA: Diagnosis not present

## 2019-10-18 DIAGNOSIS — I4891 Unspecified atrial fibrillation: Secondary | ICD-10-CM | POA: Diagnosis not present

## 2019-10-18 LAB — POCT INR: INR: 3.4 — AB (ref 2.0–3.0)

## 2019-10-18 NOTE — Patient Instructions (Signed)
Hold warfarin tomorrow then resume 1 tablet daily except 1/2 tablet on Tuesdays, Thursdays and Saturdays Recheck in 6 weeks

## 2019-10-21 ENCOUNTER — Other Ambulatory Visit: Payer: Self-pay

## 2019-10-21 ENCOUNTER — Ambulatory Visit (INDEPENDENT_AMBULATORY_CARE_PROVIDER_SITE_OTHER): Payer: Medicare Other | Admitting: Student

## 2019-10-21 ENCOUNTER — Encounter: Payer: Self-pay | Admitting: Student

## 2019-10-21 VITALS — BP 130/68 | HR 74 | Ht 70.0 in | Wt 194.0 lb

## 2019-10-21 DIAGNOSIS — I5032 Chronic diastolic (congestive) heart failure: Secondary | ICD-10-CM | POA: Diagnosis not present

## 2019-10-21 DIAGNOSIS — I4819 Other persistent atrial fibrillation: Secondary | ICD-10-CM | POA: Diagnosis not present

## 2019-10-21 DIAGNOSIS — I10 Essential (primary) hypertension: Secondary | ICD-10-CM

## 2019-10-21 DIAGNOSIS — E785 Hyperlipidemia, unspecified: Secondary | ICD-10-CM

## 2019-10-21 DIAGNOSIS — I2510 Atherosclerotic heart disease of native coronary artery without angina pectoris: Secondary | ICD-10-CM | POA: Diagnosis not present

## 2019-10-21 DIAGNOSIS — L97511 Non-pressure chronic ulcer of other part of right foot limited to breakdown of skin: Secondary | ICD-10-CM

## 2019-10-21 DIAGNOSIS — Z79899 Other long term (current) drug therapy: Secondary | ICD-10-CM

## 2019-10-21 NOTE — Progress Notes (Signed)
Cardiology Office Note    Date:  10/22/2019   ID:  Jerry Cain, DOB 01-27-1932, MRN [id number]  PCP:  Patient, No Pcp Per  Cardiologist: Sanda Klein, MD    Chief Complaint  Patient presents with  . Follow-up    6 month visit    History of Present Illness:    Jerry Cain is a 86 y.o. male with past medical history of CAD (s/p PTCA and stenting of mid-LAD and LCx in 2009, NST in 03/2018 showing prior infarct with no current ischemia), persistent atrial fibrillation (on Coumadin), trifasicular block, chronic diastolic CHF, HTN, and HLD who presents to the office today for 51-month follow-up.   He was last examined by Dr. Sallyanne Kuster in 03/2019 and reported NYHA Class 2 dyspnea but reported his knees hindered his activity more than his breathing. Denied any chest pain or palpitations and weight had been stable at 191 - 194 lbs. He was continued on his current cardiac medications at that time including Coreg 25mg  BID, Lasix 20mg  daily, Imdur 60mg  daily, Lisinopril 20mg  daily, Crestor 10mg  daily and Coumadin for anticoagulation.   In talking with the patient and his daughter today, he reports overall doing well from a cardiac perspective since his last visit. He reports that he did have a discomfort along the center of his chest when bending over to pull weeds out of his garden or when he would sleep on his left side and had his right arm hanging off the bed but this has not occurred since sleeping on his right side. He denies any exertional chest pain when walking outside. No recent orthopnea, PND or edema. Reports his weight has overall been stable on his home scales and he brings with him today a detailed log of his weights , BP and HR. AM weights have been stable at 189 - 190 lbs on his home scales and BP has been well-controlled with HR typically in the 70's to 80's.    Past Medical History:  Diagnosis Date  . Adenomatous colon polyp 2001  . Anxiety   . Atrial  fibrillation ([place])    takes Coumadin daily  . BPH (benign prostatic hyperplasia)    takes Proscar daily  . CAD (coronary artery disease) 2009   a. s/p PTCA and stenting of mid-LAD and LCx in 2009 b. NST in 03/2018 showing prior infarct with no current ischemia  . Carpal tunnel syndrome    right  . Complication of anesthesia    hard to wake up  . Diverticulosis 2007   tcs by Dr. Laural Golden  . GERD (gastroesophageal reflux disease)   . Gout   . Hypercholesterolemia    takes Atorvastatin daily  . Hypertension    takes Imdur,Coreg,and Lisinopril daily  . Hypothyroidism    takes SYnthroid daily  . Joint pain   . Joint swelling   . Nocturia   . Numbness    right hand pointer and middle finger  . Osteoarthritis    left knee  . Pneumonia    many,many yrs ago  . Tubular adenoma 2001    Past Surgical History:  Procedure Laterality Date  . Barbie Banner OSTEOTOMY Right 03/17/2013   Procedure: Barbie Banner OSTEOTOMY;  Surgeon: Marcheta Grammes, DPM;  Location: [facility];  Service: Orthopedics;  Laterality: Right;  . BUNIONECTOMY Right 03/17/2013   Procedure: BUNIONECTOMY, Arthroplasty 2nd toe right foot;  Surgeon: Marcheta Grammes, DPM;  Location: [facility];  Service: Orthopedics;  Laterality: Right;  . CARDIAC  CATHETERIZATION  06/11/2007   PTCA X2 in 06/2007:  2 overlapping Cypher stents to LAD (2.5x13 and 3.0x23)  . CARDIAC CATHETERIZATION  06/21/2007   Cypher stent 2.5 x 13  to OM branch  . CARDIOVERSION  12/12/2011   Procedure: CARDIOVERSION;  Surgeon: Sanda Klein, MD;  Location: [facility];  Service: Cardiovascular;  Laterality: N/A;  . CATARACT EXTRACTION     bilateral  . COLONOSCOPY  2007   Dr. Laural Golden- L sided diverticulosis. Next TCS 2012 due to h/o tubular adenoma.  . COLONOSCOPY  02/26/2011   Procedure: COLONOSCOPY;  Surgeon: Daneil Dolin, MD;  Location: [facility];  Service: Endoscopy;  Laterality: N/A;  11:05  . CORONARY ANGIOPLASTY     x 3   . FLEXOR TENOTOMY Left  03/17/2013   Procedure: PERCUTANEOUS FLEXOR TENOTOMY 3RD TOE LEFT FOOT;  Surgeon: Marcheta Grammes, DPM;  Location: [facility];  Service: Orthopedics;  Laterality: Left;  . HERNIA REPAIR  2009   left inguinal  . rot Left   . TEE WITHOUT CARDIOVERSION  12/12/2011   Procedure: TRANSESOPHAGEAL ECHOCARDIOGRAM (TEE);  Surgeon: Sanda Klein, MD;  Location: [facility];  Service: Cardiovascular;  Laterality: N/A;   successful/sinus rhythm  . TOTAL KNEE ARTHROPLASTY Left 06/14/2013   Procedure: TOTAL KNEE ARTHROPLASTY;  Surgeon: Garald Balding, MD;  Location: [place];  Service: Orthopedics;  Laterality: Left;    Current Medications: Outpatient Medications Prior to Visit  Medication Sig Dispense Refill  . albuterol (PROVENTIL) (2.5 MG/3ML) 0.083% nebulizer solution USE 1 VIAL IN NEBULIZER EVERY 6 HOURS AS NEEDED. 75 mL 0  . Calcium Carbonate-Vitamin D (CALCIUM + D) 600-200 MG-UNIT TABS Take 1 tablet by mouth 2 (two) times daily.     . carvedilol (COREG) 25 MG tablet TAKE (1) TABLET BY MOUTH TWICE DAILY WITH A MEAL. 180 tablet 0  . finasteride (PROSCAR) 5 MG tablet Take 1 tablet (5 mg total) by mouth daily. Please call the office to schedule office visit for any future refills. Yearly overdue 90 tablet 3  . furosemide (LASIX) 20 MG tablet TAKE (1) TABLET BY MOUTH ONCE DAILY. 90 tablet 2  . Glucosamine 750 MG TABS Take 750 mg by mouth 2 (two) times daily.    . isosorbide mononitrate (IMDUR) 60 MG 24 hr tablet TAKE ONE TABLET ONCE DAILY IN THE MORNING. 90 tablet 2  . levothyroxine (SYNTHROID) 150 MCG tablet TAKE (1) TABLET BY MOUTH ONCE DAILY. 90 tablet 2  . lisinopril (ZESTRIL) 20 MG tablet TAKE (1) TABLET BY MOUTH ONCE DAILY. 90 tablet 1  . Multiple Vitamin (MULTIVITAMIN) capsule Take 1 capsule by mouth every morning.     . Nebulizer MISC 1 Units by Does not apply route daily. 1 each 0  . nitroGLYCERIN (NITROSTAT) 0.4 MG SL tablet DISSOLVE 1 TABLET UNDER TONGUE EVERY 5 MINUTES UP TO 15 MIN FOR  CHESTPAIN. IF NO RELIEF CALL 911. (Patient taking differently: Place 0.4 mg under the tongue every 5 (five) minutes as needed for chest pain. ) 25 tablet 6  . rosuvastatin (CRESTOR) 10 MG tablet Take 1 tablet (10 mg total) by mouth daily. 12 tablet 1  . traMADol (ULTRAM) 50 MG tablet Take 50 mg by mouth 3 (three) times daily as needed.    . warfarin (COUMADIN) 5 MG tablet TAKE 1 TABLET BY MOUTH DAILY OR AS DIRECTED BY COUMADIN CLINIC. 90 tablet 0   No facility-administered medications prior to visit.     Allergies:   Carbapenems, Cephalosporins, and Penicillins  Social History   Socioeconomic History  . Marital status: Married    Spouse name: Not on file  . Number of children: 4  . Years of education: Not on file  . Highest education level: Not on file  Occupational History  . Occupation: retired  Tobacco Use  . Smoking status: Never Smoker  . Smokeless tobacco: Never Used  Vaping Use  . Vaping Use: Never used  Substance and Sexual Activity  . Alcohol use: No  . Drug use: No  . Sexual activity: Not on file  Other Topics Concern  . Not on file  Social History Narrative  . Not on file   Social Determinants of Health   Financial Resource Strain:   . Difficulty of Paying Living Expenses:   Food Insecurity:   . Worried About Charity fundraiser in the Last Year:   . Arboriculturist in the Last Year:   Transportation Needs:   . Film/video editor (Medical):   Marland Kitchen Lack of Transportation (Non-Medical):   Physical Activity:   . Days of Exercise per Week:   . Minutes of Exercise per Session:   Stress:   . Feeling of Stress :   Social Connections:   . Frequency of Communication with Friends and Family:   . Frequency of Social Gatherings with Friends and Family:   . Attends Religious Services:   . Active Member of Clubs or Organizations:   . Attends Archivist Meetings:   Marland Kitchen Marital Status:      Family History:  The patient's family history includes Heart  disease in his brother; Pneumonia in his father.   Review of Systems:   Please see the history of present illness.     General:  No chills, fever, night sweats or weight changes.  Cardiovascular:  No dyspnea on exertion, edema, orthopnea, palpitations, paroxysmal nocturnal dyspnea. Positive for chest pain.  Dermatological: No rash, lesions/masses Respiratory: No cough, dyspnea Urologic: No hematuria, dysuria Abdominal:   No nausea, vomiting, diarrhea, bright red blood per rectum, melena, or hematemesis Neurologic:  No visual changes, wkns, changes in mental status. All other systems reviewed and are otherwise negative except as noted above.   Physical Exam:    VS:  BP 130/68   Pulse 74   Ht 5\' 10"  (1.778 m)   Wt 194 lb (88 kg)   SpO2 97%   BMI 27.84 kg/m    General: Pleasant elderly male appearing in no acute distress. Head: Normocephalic, atraumatic, sclera non-icteric.  Neck: No carotid bruits. JVD not elevated.  Lungs: Respirations regular and unlabored, without wheezes or rales.  Heart: Irregularly irregular. No S3 or S4.  No murmur, no rubs, or gallops appreciated. Abdomen: Soft, non-tender, non-distended. No obvious abdominal masses. Msk:  Strength and tone appear normal for age. No obvious joint deformities or effusions. Extremities: No clubbing or cyanosis. No lower extremityedema.  Distal pedal pulses are 1+ bilaterally. Area appearing most consistent with a callus along his right 2nd and 3rd toes but black borders around the area. No erythema or drainage.  Neuro: Alert and oriented X 3. Moves all extremities spontaneously. No focal deficits noted. Psych:  Responds to questions appropriately with a normal affect. Skin: No rashes or lesions noted  Wt Readings from Last 3 Encounters:  10/21/19 194 lb (88 kg)  07/29/19 189 lb (85.7 kg)  05/04/19 190 lb (86.2 kg)     Studies/Labs Reviewed:   EKG:  EKG is notordered today. EKG  from 03/10/2019 is reviewed and  demonstrates rate-controlled atrial fibrillation, HR 74 with RBBB and LAFB.   Recent Labs: No results found for requested labs within last 8760 hours.   Lipid Panel    Component Value Date/Time   CHOL 117 08/27/2017 0820   TRIG 83 08/27/2017 0820   HDL 34 (L) 08/27/2017 0820   CHOLHDL 3.4 08/27/2017 0820   CHOLHDL 3.2 06/12/2016 0929   VLDL 16 06/12/2016 0929   LDLCALC 66 08/27/2017 0820    Additional studies/ records that were reviewed today include:   NST: 03/2018  There was no ST segment deviation noted during stress.  Findings consistent with prior inferior/apical myocardial infarction without current ischemia  This is an intermediate risk study. Risk based on decreased LVEF, no current myocardium at jeopardy. Correlate LVEF with echo.  The left ventricular ejection fraction is mildly decreased (49%).  Echocardiogram: 03/2018 Study Conclusions   - Left ventricle: The cavity size was normal. Wall thickness was  increased in a pattern of moderate LVH. Systolic function was  normal. The estimated ejection fraction was in the range of 60%  to 65%. Wall motion was normal; there were no regional wall  motion abnormalities. The study is not technically sufficient to  allow evaluation of LV diastolic function.  - Aortic valve: Moderately calcified annulus. Trileaflet;  moderately thickened leaflets. Valve area (VTI): 1.79 cm^2. Valve  area (Vmax): 1.79 cm^2. Valve area (Vmean): 1.76 cm^2.  - Mitral valve: Mildly calcified annulus. Mildly thickened leaflets  . There was mild regurgitation.  - Left atrium: The atrium was moderately dilated.  - Right atrium: The atrium was moderately dilated.   Assessment:    1. Coronary artery disease involving native coronary artery of native heart without angina pectoris   2. Chronic diastolic heart failure (HCC)   3. Persistent atrial fibrillation ([place])   4. Essential hypertension   5. Hyperlipidemia LDL goal <70   6.  Medication management   7. Ulcer of right foot, limited to breakdown of skin ([place])      Plan:   In order of problems listed above:  1. CAD - He is s/p PTCA and stenting of mid-LAD and LCx in 2009 with NST in 03/2018 showing prior infarct with no current ischemia. - He denies any exertional chest pain. Has experienced episodes of positional pain as outlined above which improved with sleeping on his right side. Symptoms seem most consistent with MSK pain and I encouraged him to make [place] aware if he develops any new exertional symptoms.  - Continue Coreg, Imdur and Crestor at current dosing. He is not on ASA given the need for anticoagulation.    2. Chronic Diastolic CHF - He denies any recent orthopnea, PND or edema. Weight has been stable on his home scales. Will continue Lasix 20mg  daily. He is aware he can take an extra tablet if needed but has not had to utilize this recently. Will recheck kidney function and electrolytes.   3. Persistent Atrial Fibrillation - He denies any recent palpitations and HR is well-controlled in the 70's during today's visit. Continue Coreg 25mg  BID for rate-control. - He denies any evidence of active bleeding. INR was elevated to 3.4 when checked earlier this week. Will recheck CBC with upcoming labs.   4. HTN - BP is well-controlled at 130/68 during today's visit and has been well-controlled by review of his home readings. Continue current medication regimen with Coreg 25mg  BID, Imdur 60mg  dialy and Lisinopril 20mg  daily.  5. HLD - No recent FLP available for review and he does not have a PCP at this time. Remains on Crestor 10mg  daily. I have asked him to come back for repeat fasting labs to include FLP and LFT's. Goal LDL is less than 70 with his known CAD (previously 101 in 2019).   6. Possible Ulcer along Right Foot - He has an area consistent with a callus along his right 2nd and 3rd toes but reports he has cut the area with his nail clippers for over a  year and has now developed a small area of black tissue around the borders. No erythema or drainage. I am concerned this is now an ulcer and he has multiple risk factors for PVD. He does not seem too concerned about the area at this time as it has been stable for 1 year but I have strongly encouraged him to follow-up with his former Podiatrist (Dr. Caprice Beaver) for repeat evaluation. Referral entered. If felt to be concerning for PVD, we will be happy to arrange for lower extremity dopplers and ABI studies.    Medication Adjustments/Labs and Tests Ordered: Current medicines are reviewed at length with the patient today.  Concerns regarding medicines are outlined above.  Medication changes, Labs and Tests ordered today are listed in the Patient Instructions below. Patient Instructions  Medication Instructions:  Your physician recommends that you continue on your current medications as directed. Please refer to the Current Medication list given to you today.  *If you need a refill on your cardiac medications before your next appointment, please call your pharmacy*   Lab Work: Lipids,cmet,cbc  If you have labs (blood work) drawn today and your tests are completely normal, you will receive your results only by: Marland Kitchen MyChart Message (if you have MyChart) OR . A paper copy in the mail If you have any lab test that is abnormal or we need to change your treatment, we will call you to review the results.   Testing/Procedures: None today    Follow-Up: At [facility], you and your health needs are our priority.  As part of our continuing mission to provide you with exceptional heart care, we have created designated Provider Care Teams.  These Care Teams include your primary Cardiologist (physician) and Advanced Practice Providers (APPs -  Physician Assistants and Nurse Practitioners) who all work together to provide you with the care you need, when you need it.  We recommend signing up for the patient  portal called "MyChart".  Sign up information is provided on this After Visit Summary.  MyChart is used to connect with patients for Virtual Visits (Telemedicine).  Patients are able to view lab/test results, encounter notes, upcoming appointments, etc.  Non-urgent messages can be sent to your provider as well.   To learn more about what you can do with MyChart, go to [email or web address].    Your next appointment:   6 month(s)  The format for your next appointment:   In Person  Provider:   Sanda Klein, MD   Other Instructions Referral placed to podiatry, Dr.McKinney who pt has seen in past.      Thank you for choosing [facility] !            Signed, Erma Heritage, PA-C  10/22/2019 8:39 AM    [place] [address] [place], [place] [address] Phone: [phone] Fax: [phone]

## 2019-10-21 NOTE — Patient Instructions (Addendum)
Medication Instructions:  Your physician recommends that you continue on your current medications as directed. Please refer to the Current Medication list given to you today.  *If you need a refill on your cardiac medications before your next appointment, please call your pharmacy*   Lab Work: Lipids,cmet,cbc  If you have labs (blood work) drawn today and your tests are completely normal, you will receive your results only by: Marland Kitchen MyChart Message (if you have MyChart) OR . A paper copy in the mail If you have any lab test that is abnormal or we need to change your treatment, we will call you to review the results.   Testing/Procedures: None today    Follow-Up: At [facility], you and your health needs are our priority.  As part of our continuing mission to provide you with exceptional heart care, we have created designated Provider Care Teams.  These Care Teams include your primary Cardiologist (physician) and Advanced Practice Providers (APPs -  Physician Assistants and Nurse Practitioners) who all work together to provide you with the care you need, when you need it.  We recommend signing up for the patient portal called "MyChart".  Sign up information is provided on this After Visit Summary.  MyChart is used to connect with patients for Virtual Visits (Telemedicine).  Patients are able to view lab/test results, encounter notes, upcoming appointments, etc.  Non-urgent messages can be sent to your provider as well.   To learn more about what you can do with MyChart, go to [email or web address].    Your next appointment:   6 month(s)  The format for your next appointment:   In Person  Provider:   Sanda Klein, MD   Other Instructions Referral placed to podiatry, Dr.McKinney who pt has seen in past.      Thank you for choosing [facility] !

## 2019-10-22 ENCOUNTER — Encounter: Payer: Self-pay | Admitting: Student

## 2019-10-22 NOTE — Progress Notes (Signed)
Thanks , NewB! We miss you in [place]

## 2019-10-28 ENCOUNTER — Other Ambulatory Visit: Payer: Self-pay

## 2019-10-28 ENCOUNTER — Other Ambulatory Visit (HOSPITAL_COMMUNITY)
Admission: RE | Admit: 2019-10-28 | Discharge: 2019-10-28 | Disposition: A | Discharge status: Home / Self Care | Payer: Medicare Other | Source: Ambulatory Visit | Attending: Student | Admitting: Student

## 2019-10-28 DIAGNOSIS — E785 Hyperlipidemia, unspecified: Secondary | ICD-10-CM | POA: Diagnosis present

## 2019-10-28 DIAGNOSIS — Z79899 Other long term (current) drug therapy: Secondary | ICD-10-CM | POA: Insufficient documentation

## 2019-10-28 LAB — LIPID PANEL
Cholesterol: 129 mg/dL (ref 0–200)
HDL: 32 mg/dL — ABNORMAL LOW (ref >40)
LDL Cholesterol: 84 mg/dL (ref 0–99)
Total CHOL/HDL Ratio: 4 RATIO
Triglycerides: 67 mg/dL (ref <150)
VLDL: 13 mg/dL (ref 0–40)

## 2019-10-28 LAB — COMPREHENSIVE METABOLIC PANEL
ALT: 24 U/L (ref 0–44)
AST: 27 U/L (ref 15–41)
Albumin: 3.9 g/dL (ref 3.5–5.0)
Alkaline Phosphatase: 51 U/L (ref 38–126)
Anion gap: 11 (ref 5–15)
BUN: 34 mg/dL — ABNORMAL HIGH (ref 8–23)
CO2: 23 mmol/L (ref 22–32)
Calcium: 8.7 mg/dL — ABNORMAL LOW (ref 8.9–10.3)
Chloride: 106 mmol/L (ref 98–111)
Creatinine, Ser: 1.12 mg/dL (ref 0.61–1.24)
GFR calc Af Amer: >60 mL/min (ref >60)
GFR calc non Af Amer: 60 mL/min — ABNORMAL LOW (ref >60)
Glucose, Bld: 99 mg/dL (ref 70–99)
Potassium: 4.1 mmol/L (ref 3.5–5.1)
Sodium: 140 mmol/L (ref 135–145)
Total Bilirubin: 1.3 mg/dL — ABNORMAL HIGH (ref 0.3–1.2)
Total Protein: 7.1 g/dL (ref 6.5–8.1)

## 2019-10-28 LAB — CBC
HCT: 39.2 % (ref 39.0–52.0)
Hemoglobin: 12.6 g/dL — ABNORMAL LOW (ref 13.0–17.0)
MCH: 31 pg (ref 26.0–34.0)
MCHC: 32.1 g/dL (ref 30.0–36.0)
MCV: 96.3 fL (ref 80.0–100.0)
Platelets: 177 10*3/uL (ref 150–400)
RBC: 4.07 MIL/uL — ABNORMAL LOW (ref 4.22–5.81)
RDW: 14.6 % (ref 11.5–15.5)
WBC: 5.5 10*3/uL (ref 4.0–10.5)
nRBC: 0 % (ref 0.0–0.2)

## 2019-10-29 ENCOUNTER — Other Ambulatory Visit: Payer: Self-pay | Admitting: Cardiovascular Disease

## 2019-11-02 ENCOUNTER — Telehealth: Payer: Self-pay

## 2019-11-02 DIAGNOSIS — Z79899 Other long term (current) drug therapy: Secondary | ICD-10-CM

## 2019-11-02 MED ORDER — ROSUVASTATIN CALCIUM 20 MG PO TABS
20.0000 mg | ORAL_TABLET | Freq: Every day | ORAL | 3 refills | Status: DC
Start: 2019-11-02 — End: 2020-04-04

## 2019-11-02 NOTE — Telephone Encounter (Signed)
I spoke with patient, gave him lab results.He verbalized understanding that Crestor is now increased to 20 mg daily.E-scribed rx to [facility]. Mailed lab slip to pt.

## 2019-11-02 NOTE — Telephone Encounter (Signed)
-----   Message from Erma Heritage, [place] sent at 10/29/2019 12:15 PM EDT ----- Please let the patient know his anemia has improved and Hgb is now at 12.6. Platelet count normal. Electrolytes and kidney function within a normal range. His total cholesterol is well-controlled at 129. His LDL (bad cholesterol) is above goal as we prefer for this to be less than 70 with his known CAD and this was at 2. He is currently on Crestor 10mg  daily for his cholesterol. I would recommend increasing this to 20mg  daily and rechecking FLP and LFT's in 2 months for reassessment.

## 2019-11-09 ENCOUNTER — Telehealth: Payer: Self-pay | Admitting: Student

## 2019-11-09 NOTE — Telephone Encounter (Signed)
PT WOULD LIKE TO ASK BRITTANY,PA-C QUESTIONS ABOUT HIS MEDICATIONS   PLEASE CALL [phone]   THANKS RENEE

## 2019-11-10 NOTE — Telephone Encounter (Signed)
I left a message for patient with B.Strader's advice

## 2019-11-10 NOTE — Telephone Encounter (Signed)
Patient was outside yesterday at lunch time, his head felt hot and his sinuses were stopped up.He went inside to A/C and sat in the chair for 45 minutes and sx's resolved. He is concerned that the increase in his crestor to 20 mg is the reason. I told him I felt it was the hot weather/heat index and encouraged him to go out in the early am or late evening.

## 2019-11-10 NOTE — Telephone Encounter (Signed)
     I agree with your recommendations as this would be an extremely unlikely side effect of Crestor. Given his cardiac history, would recommend that he limit time outside when the temperature is greater than 80 degrees.  Signed, Erma Heritage, PA-C 11/10/2019, 12:59 PM Pager: [phone]

## 2019-11-29 ENCOUNTER — Ambulatory Visit (INDEPENDENT_AMBULATORY_CARE_PROVIDER_SITE_OTHER): Payer: Medicare Other | Admitting: *Deleted

## 2019-11-29 DIAGNOSIS — Z5181 Encounter for therapeutic drug level monitoring: Secondary | ICD-10-CM

## 2019-11-29 DIAGNOSIS — I4891 Unspecified atrial fibrillation: Secondary | ICD-10-CM | POA: Diagnosis not present

## 2019-11-29 LAB — POCT INR: INR: 3.3 — AB (ref 2.0–3.0)

## 2019-11-29 NOTE — Patient Instructions (Signed)
Decrease warfarin to 1/2 tablet daily except 1 tablet on Mondays, Wednesdays and Fridays Recheck in 6 weeks

## 2019-12-02 ENCOUNTER — Other Ambulatory Visit: Payer: Self-pay | Admitting: Cardiovascular Disease

## 2019-12-06 ENCOUNTER — Ambulatory Visit (INDEPENDENT_AMBULATORY_CARE_PROVIDER_SITE_OTHER): Payer: Medicare Other

## 2019-12-06 ENCOUNTER — Ambulatory Visit
Admission: EM | Admit: 2019-12-06 | Discharge: 2019-12-06 | Disposition: A | Discharge status: Home / Self Care | Payer: Medicare Other | Attending: Emergency Medicine | Admitting: Emergency Medicine

## 2019-12-06 DIAGNOSIS — M79641 Pain in right hand: Secondary | ICD-10-CM | POA: Diagnosis not present

## 2019-12-06 DIAGNOSIS — M7989 Other specified soft tissue disorders: Secondary | ICD-10-CM

## 2019-12-06 DIAGNOSIS — M25531 Pain in right wrist: Secondary | ICD-10-CM

## 2019-12-06 DIAGNOSIS — S6991XA Unspecified injury of right wrist, hand and finger(s), initial encounter: Secondary | ICD-10-CM

## 2019-12-06 DIAGNOSIS — L03113 Cellulitis of right upper limb: Secondary | ICD-10-CM

## 2019-12-06 MED ORDER — CLINDAMYCIN HCL 300 MG PO CAPS
300.0000 mg | ORAL_CAPSULE | Freq: Four times a day (QID) | ORAL | 0 refills | Status: AC
Start: 2019-12-06 — End: 2019-12-16

## 2019-12-06 NOTE — Discharge Instructions (Addendum)
X-rays showed: Prominent soft tissue swelling. No radiopaque foreign body. Diffuse severe degenerative change as above making evaluation for fracture difficult. Scapholunate separation may be present. Findings suggesting SLAC wrist. Chondrocalcinosis. No acute fracture identified. No evidence of dislocation.  Continue conservative management of rest, ice, and gentle stretches Continue with tylenol as needed for pain Ace bandage applied Will cover for possible cellulitis or skin infection today Clindamycin prescribed.  Take as directed and to completion Follow up with PCP or hand specialist for recheck Return or go to the ER if you have any new or worsening symptoms (fever, chills, chest pain, redness, swelling, drainage, bleeding, etc...)

## 2019-12-06 NOTE — ED Triage Notes (Signed)
Pt has right hand pain and swelling after using rope to pull limbs out of tree , swelling noted

## 2019-12-06 NOTE — ED Provider Notes (Signed)
[facility]   [id number] 12/06/19 Arrival Time: 33  CC: Rt hand PAIN  SUBJECTIVE: History from: patient. Jerry Cain is a 86 y.o. male complains of RT hand pain that began 3 days ago.  Symptoms began after pulling down tree limbs with a rope.  Localizes the pain to the top outside of wrist.  Describes the pain as constant and achy in character.  Has tried OTC tylenol with relief.  Symptoms are made worse with ROM.  Denies similar symptoms in the past.  Complains of associated swelling, erythema.  Denies fever, chills, ecchymosis, weakness, numbness and tingling.  ROS: As per HPI.  All other pertinent ROS negative.     Past Medical History:  Diagnosis Date  . Adenomatous colon polyp 2001  . Anxiety   . Atrial fibrillation ([place])    takes Coumadin daily  . BPH (benign prostatic hyperplasia)    takes Proscar daily  . CAD (coronary artery disease) 2009   a. s/p PTCA and stenting of mid-LAD and LCx in 2009 b. NST in 03/2018 showing prior infarct with no current ischemia  . Carpal tunnel syndrome    right  . Complication of anesthesia    hard to wake up  . Diverticulosis 2007   tcs by Dr. Laural Golden  . GERD (gastroesophageal reflux disease)   . Gout   . Hypercholesterolemia    takes Atorvastatin daily  . Hypertension    takes Imdur,Coreg,and Lisinopril daily  . Hypothyroidism    takes SYnthroid daily  . Joint pain   . Joint swelling   . Nocturia   . Numbness    right hand pointer and middle finger  . Osteoarthritis    left knee  . Pneumonia    many,many yrs ago  . Tubular adenoma 2001   Past Surgical History:  Procedure Laterality Date  . Barbie Banner OSTEOTOMY Right 03/17/2013   Procedure: Barbie Banner OSTEOTOMY;  Surgeon: Marcheta Grammes, DPM;  Location: [facility];  Service: Orthopedics;  Laterality: Right;  . BUNIONECTOMY Right 03/17/2013   Procedure: BUNIONECTOMY, Arthroplasty 2nd toe right foot;  Surgeon: Marcheta Grammes, DPM;  Location: [facility];   Service: Orthopedics;  Laterality: Right;  . CARDIAC CATHETERIZATION  06/11/2007   PTCA X2 in 06/2007:  2 overlapping Cypher stents to LAD (2.5x13 and 3.0x23)  . CARDIAC CATHETERIZATION  06/21/2007   Cypher stent 2.5 x 13  to OM branch  . CARDIOVERSION  12/12/2011   Procedure: CARDIOVERSION;  Surgeon: Sanda Klein, MD;  [facility]: [facility];  Service: Cardiovascular;  Laterality: N/A;  . CATARACT EXTRACTION     bilateral  . COLONOSCOPY  2007   Dr. Laural Golden- L sided diverticulosis. Next TCS 2012 due to h/o tubular adenoma.  . COLONOSCOPY  02/26/2011   Procedure: COLONOSCOPY;  Surgeon: Daneil Dolin, MD;  [facility]: [facility];  Service: Endoscopy;  Laterality: N/A;  11:05  . CORONARY ANGIOPLASTY     x 3   . FLEXOR TENOTOMY Left 03/17/2013   Procedure: PERCUTANEOUS FLEXOR TENOTOMY 3RD TOE LEFT FOOT;  Surgeon: Marcheta Grammes, DPM;  Location: [facility];  Service: Orthopedics;  Laterality: Left;  . HERNIA REPAIR  2009   left inguinal  . rot Left   . TEE WITHOUT CARDIOVERSION  12/12/2011   Procedure: TRANSESOPHAGEAL ECHOCARDIOGRAM (TEE);  Surgeon: Sanda Klein, MD;  Location: [facility] ENDOSCOPY;  Service: Cardiovascular;  Laterality: N/A;   successful/sinus rhythm  . TOTAL KNEE ARTHROPLASTY Left 06/14/2013   Procedure: TOTAL KNEE ARTHROPLASTY;  Surgeon:  Garald Balding, MD;  Location: [facility];  Service: Orthopedics;  Laterality: Left;   Allergies  Allergen Reactions  . Carbapenems Other (See Comments)    Altered mental status  . Cephalosporins     Unknown reaction  . Nsaids Other (See Comments)    On blood thinner   . Penicillins Swelling and Other (See Comments)    Did it involve swelling of the face/tongue/throat, SOB, or low BP? Unknown-possible swelling Did it involve sudden or severe rash/hives, skin peeling, or any reaction on the inside of your mouth or nose? Unknown Did you need to seek medical attention at a hospital or doctor's office? Unknown When did it last happen?Over  10 years (possible swelling) If all above answers are "NO", may proceed with cephalosporin use.    No current facility-administered medications on file prior to encounter.   Current Outpatient Medications on File Prior to Encounter  Medication Sig Dispense Refill  . albuterol (PROVENTIL) (2.5 MG/3ML) 0.083% nebulizer solution USE 1 VIAL IN NEBULIZER EVERY 6 HOURS AS NEEDED. 75 mL 0  . Calcium Carbonate-Vitamin D (CALCIUM + D) 600-200 MG-UNIT TABS Take 1 tablet by mouth 2 (two) times daily.     . carvedilol (COREG) 25 MG tablet TAKE (1) TABLET BY MOUTH TWICE DAILY WITH A MEAL. 180 tablet 0  . finasteride (PROSCAR) 5 MG tablet Take 1 tablet (5 mg total) by mouth daily. Please call the office to schedule office visit for any future refills. Yearly overdue 90 tablet 3  . furosemide (LASIX) 20 MG tablet TAKE (1) TABLET BY MOUTH ONCE DAILY. 90 tablet 2  . Glucosamine 750 MG TABS Take 750 mg by mouth 2 (two) times daily.    . isosorbide mononitrate (IMDUR) 60 MG 24 hr tablet TAKE ONE TABLET ONCE DAILY IN THE MORNING. 90 tablet 2  . levothyroxine (SYNTHROID) 150 MCG tablet TAKE (1) TABLET BY MOUTH ONCE DAILY. 90 tablet 2  . lisinopril (ZESTRIL) 20 MG tablet TAKE (1) TABLET BY MOUTH ONCE DAILY. 90 tablet 1  . Multiple Vitamin (MULTIVITAMIN) capsule Take 1 capsule by mouth every morning.     . Nebulizer MISC 1 Units by Does not apply route daily. 1 each 0  . nitroGLYCERIN (NITROSTAT) 0.4 MG SL tablet DISSOLVE 1 TABLET UNDER TONGUE EVERY 5 MINUTES UP TO 15 MIN FOR CHEST PAIN. IF NO RELIEF CALL 911. 25 tablet 0  . rosuvastatin (CRESTOR) 20 MG tablet Take 1 tablet (20 mg total) by mouth daily. 90 tablet 3  . traMADol (ULTRAM) 50 MG tablet Take 50 mg by mouth 3 (three) times daily as needed.    . warfarin (COUMADIN) 5 MG tablet TAKE 1 TABLET BY MOUTH DAILY OR AS DIRECTED BY COUMADIN CLINIC. 90 tablet 0   Social History   Socioeconomic History  . Marital status: Married    Spouse name: Not on file    . Number of children: 4  . Years of education: Not on file  . Highest education level: Not on file  Occupational History  . Occupation: retired  Tobacco Use  . Smoking status: Never Smoker  . Smokeless tobacco: Never Used  Vaping Use  . Vaping Use: Never used  Substance and Sexual Activity  . Alcohol use: No  . Drug use: No  . Sexual activity: Not on file  Other Topics Concern  . Not on file  Social History Narrative  . Not on file   Social Determinants of Health   Financial Resource Strain:   .  Difficulty of Paying Living Expenses: Not on file  Food Insecurity:   . Worried About Charity fundraiser in the Last Year: Not on file  . Ran Out of Food in the Last Year: Not on file  Transportation Needs:   . Lack of Transportation (Medical): Not on file  . Lack of Transportation (Non-Medical): Not on file  Physical Activity:   . Days of Exercise per Week: Not on file  . Minutes of Exercise per Session: Not on file  Stress:   . Feeling of Stress : Not on file  Social Connections:   . Frequency of Communication with Friends and Family: Not on file  . Frequency of Social Gatherings with Friends and Family: Not on file  . Attends Religious Services: Not on file  . Active Member of Clubs or Organizations: Not on file  . Attends Archivist Meetings: Not on file  . Marital Status: Not on file  Intimate Partner Violence:   . Fear of Current or Ex-Partner: Not on file  . Emotionally Abused: Not on file  . Physically Abused: Not on file  . Sexually Abused: Not on file   Family History  Problem Relation Age of Onset  . Pneumonia Father   . Heart disease Brother        open heart surgery at age 34  . Colon cancer Neg Hx     OBJECTIVE:  Vitals:   12/06/19 1100  BP: 112/70  Pulse: 68  Resp: 20  Temp: 97.6 F (36.4 C)  SpO2: 98%    General appearance: ALERT; in no acute distress.  Head: NCAT Lungs: Normal respiratory effort CV: Radial pulse 2+; cap refill  < 2 seconds Musculoskeletal: RT hand/ wrist Inspection: Swelling and warmth to the touch over posterior aspect of wrist; 0.25 cm scab also present over dorsal aspect of RT wrist Palpation: TTP over lateral dorsal aspect of wrist ROM: LROM about the wrist Strength: deferred Skin: warm and dry Neurologic: Ambulates without difficulty; Sensation intact about the upper extremities Psychological: alert and cooperative; normal mood and affect  DIAGNOSTIC STUDIES:  DG Hand Complete Right  Result Date: 12/06/2019 CLINICAL DATA:  Injury.  Swelling. EXAM: RIGHT HAND - COMPLETE 3+ VIEW COMPARISON:  No prior. FINDINGS: Prominent soft tissue swelling. No radiopaque foreign body. Diffuse severe degenerative change make evaluation for fracture difficult. Degenerative changes are particularly severe about the radiocarpal and first carpometacarpal joints. Scapholunate separation may be present. Findings suggesting SLAC wrist. Chondrocalcinosis. Bony densities noted along the posterior carpus appear corticated and are most likely old possibly related old injury. No definite evidence of acute fracture identified. No dislocation noted. IMPRESSION: Prominent soft tissue swelling. No radiopaque foreign body. Diffuse severe degenerative change as above making evaluation for fracture difficult. Scapholunate separation may be present. Findings suggesting SLAC wrist. Chondrocalcinosis. No acute fracture identified. No evidence of dislocation. Electronically Signed   By: Marcello Moores  Register   On: 12/06/2019 11:37    I have reviewed the x-rays myself and the radiologist interpretation. I am in agreement with the radiologist interpretation.     ASSESSMENT & PLAN:  1. Right hand pain   2. Right wrist pain   3. Cellulitis of right wrist    Meds ordered this encounter  Medications  . clindamycin (CLEOCIN) 300 MG capsule    Sig: Take 1 capsule (300 mg total) by mouth every 6 (six) hours for 10 days.    Dispense:  40 capsule     Refill:  0    Order Specific Question:   Supervising Provider    Answer:   Raylene Everts [[id number]]   X-rays showed: Prominent soft tissue swelling. No radiopaque foreign body. Diffuse severe degenerative change as above making evaluation for fracture difficult. Scapholunate separation may be present. Findings suggesting SLAC wrist. Chondrocalcinosis. No acute fracture identified. No evidence of dislocation.  Continue conservative management of rest, ice, and gentle stretches Continue with tylenol as needed for pain Ace bandage applied Will cover for possible cellulitis or skin infection today Clindamycin prescribed.  Take as directed and to completion Follow up with PCP or hand specialist for recheck Return or go to the ER if you have any new or worsening symptoms (fever, chills, chest pain, redness, swelling, drainage, bleeding, etc...)   Reviewed expectations re: course of current medical issues. Questions answered. Outlined signs and symptoms indicating need for more acute intervention. Patient verbalized understanding. After Visit Summary given.    Lestine Box, PA-C 12/06/19 1154

## 2019-12-10 ENCOUNTER — Other Ambulatory Visit: Payer: Self-pay | Admitting: Cardiovascular Disease

## 2019-12-26 ENCOUNTER — Telehealth: Payer: Self-pay | Admitting: Student

## 2019-12-26 NOTE — Telephone Encounter (Signed)
New message     Pt c/o of Chest Pain: STAT if CP now or developed within 24 hours  1. Are you having CP right now? No   2. Are you experiencing any other symptoms (ex. SOB, nausea, vomiting, sweating)? Not at this time   3. How long have you been experiencing CP? Had chest pain in the middle of the night, it lasted for about an hour - did not have any other symptoms - patient made appt for Thursday with Jonni Sanger in [place] office   4. Is your CP continuous or coming and going? Just had once   5. Have you taken Nitroglycerin? No

## 2019-12-26 NOTE — Progress Notes (Signed)
Cardiology Office Note   Date:  12/27/2019   ID:  JAICION Jerry, DOB 07-13-1931, MRN [id number]  PCP:  Patient, No Pcp Per  Cardiologist:  Dr. Sallyanne Cain     Chief Complaint  Patient presents with  . Chest Pain      History of Present Illness: Jerry Cain is a 86 y.o. male who presents for chest pain  past medical history of CAD (s/p PTCA and stenting of mid-LAD and LCx in 2009, NST in 03/2018 showing prior infarct with no current ischemia), persistent atrial fibrillation (on Coumadin), trifasicular block, chronic diastolic CHF, HTN, and HLD   examined by Dr. Sallyanne Cain in 03/2019 and reported NYHA Class 2 dyspnea but reported his knees hindered his activity more than his breathing.meds  Coreg 25mg  BID, Lasix 20mg  daily, Imdur 60mg  daily, Lisinopril 20mg  daily, Crestor 10mg  daily and Coumadin for anticoagulation.   Today he present with episode of chest pain early AM when he went to [facility] and after drinking 6 oz of OJ - he laid back down and then had "indigestion" type pain in chest really epigastric area.  No radiation and no SOB or diaphoresis. He did take NTG without any improvement.    Lasted about an hour- has not had any since and Monday he used chain saw to cut down 5 or 6 trees on property.   No other complaints.  No bleeding on coumadin.   Past Medical History:  Diagnosis Date  . Adenomatous colon polyp 2001  . Anxiety   . Atrial fibrillation ([place])    takes Coumadin daily  . BPH (benign prostatic hyperplasia)    takes Proscar daily  . CAD (coronary artery disease) 2009   a. s/p PTCA and stenting of mid-LAD and LCx in 2009 b. NST in 03/2018 showing prior infarct with no current ischemia  . Carpal tunnel syndrome    right  . Complication of anesthesia    hard to wake up  . Diverticulosis 2007   tcs by Dr. Laural Cain  . GERD (gastroesophageal reflux disease)   . Gout   . Hypercholesterolemia    takes Atorvastatin daily  . Hypertension    takes  Imdur,Coreg,and Lisinopril daily  . Hypothyroidism    takes SYnthroid daily  . Joint pain   . Joint swelling   . Nocturia   . Numbness    right hand pointer and middle finger  . Osteoarthritis    left knee  . Pneumonia    many,many yrs ago  . Tubular adenoma 2001    Past Surgical History:  Procedure Laterality Date  . Barbie Banner OSTEOTOMY Right 03/17/2013   Procedure: Barbie Banner OSTEOTOMY;  Surgeon: Marcheta Grammes, DPM;  [facility]: [facility];  Service: Orthopedics;  Laterality: Right;  . BUNIONECTOMY Right 03/17/2013   Procedure: BUNIONECTOMY, Arthroplasty 2nd toe right foot;  Surgeon: Marcheta Grammes, DPM;  Location: [facility];  Service: Orthopedics;  Laterality: Right;  . CARDIAC CATHETERIZATION  06/11/2007   PTCA X2 in 06/2007:  2 overlapping Cypher stents to LAD (2.5x13 and 3.0x23)  . CARDIAC CATHETERIZATION  06/21/2007   Cypher stent 2.5 x 13  to OM branch  . CARDIOVERSION  12/12/2011   Procedure: CARDIOVERSION;  Surgeon: Jerry Klein, MD;  Location: [facility];  Service: Cardiovascular;  Laterality: N/A;  . CATARACT EXTRACTION     bilateral  . COLONOSCOPY  2007   Dr. Laural Cain- L sided diverticulosis. Next TCS 2012 due to h/o tubular adenoma.  . COLONOSCOPY  02/26/2011  Procedure: COLONOSCOPY;  Surgeon: Jerry Dolin, MD;  [facility];  Service: Endoscopy;  Laterality: N/A;  11:05  . CORONARY ANGIOPLASTY     x 3   . FLEXOR TENOTOMY Left 03/17/2013   Procedure: PERCUTANEOUS FLEXOR TENOTOMY 3RD TOE LEFT FOOT;  Surgeon: Marcheta Grammes, DPM;  Location: [facility];  Service: Orthopedics;  Laterality: Left;  . HERNIA REPAIR  2009   left inguinal  . rot Left   . TEE WITHOUT CARDIOVERSION  12/12/2011   Procedure: TRANSESOPHAGEAL ECHOCARDIOGRAM (TEE);  Surgeon: Jerry Klein, MD;  Location: [facility] ENDOSCOPY;  Service: Cardiovascular;  Laterality: N/A;   successful/sinus rhythm  . TOTAL KNEE ARTHROPLASTY Left 06/14/2013   Procedure: TOTAL KNEE ARTHROPLASTY;  Surgeon:  Garald Balding, MD;  Location: [facility];  Service: Orthopedics;  Laterality: Left;     Current Outpatient Medications  Medication Sig Dispense Refill  . albuterol (PROVENTIL) (2.5 MG/3ML) 0.083% nebulizer solution USE 1 VIAL IN NEBULIZER EVERY 6 HOURS AS NEEDED. 75 mL 0  . Calcium Carbonate-Vitamin D (CALCIUM + D) 600-200 MG-UNIT TABS Take 1 tablet by mouth 2 (two) times daily.     . carvedilol (COREG) 25 MG tablet TAKE (1) TABLET BY MOUTH TWICE DAILY WITH A MEAL. 180 tablet 0  . finasteride (PROSCAR) 5 MG tablet Take 1 tablet (5 mg total) by mouth daily. Please call the office to schedule office visit for any future refills. Yearly overdue 90 tablet 3  . furosemide (LASIX) 20 MG tablet TAKE (1) TABLET BY MOUTH ONCE DAILY. 90 tablet 0  . Glucosamine 750 MG TABS Take 750 mg by mouth 2 (two) times daily.    . isosorbide mononitrate (IMDUR) 60 MG 24 hr tablet TAKE ONE TABLET ONCE DAILY IN THE MORNING. 90 tablet 2  . levothyroxine (SYNTHROID) 150 MCG tablet TAKE (1) TABLET BY MOUTH ONCE DAILY. 90 tablet 0  . lisinopril (ZESTRIL) 20 MG tablet TAKE (1) TABLET BY MOUTH ONCE DAILY. 90 tablet 1  . Multiple Vitamin (MULTIVITAMIN) capsule Take 1 capsule by mouth every morning.     . Nebulizer MISC 1 Units by Does not apply route daily. 1 each 0  . nitroGLYCERIN (NITROSTAT) 0.4 MG SL tablet DISSOLVE 1 TABLET UNDER TONGUE EVERY 5 MINUTES UP TO 15 MIN FOR CHEST PAIN. IF NO RELIEF CALL 911. 25 tablet 0  . rosuvastatin (CRESTOR) 20 MG tablet Take 1 tablet (20 mg total) by mouth daily. (Patient taking differently: Take 10 mg by mouth daily. ) 90 tablet 3  . traMADol (ULTRAM) 50 MG tablet Take 50 mg by mouth 3 (three) times daily as needed.    . warfarin (COUMADIN) 5 MG tablet TAKE 1 TABLET BY MOUTH DAILY OR AS DIRECTED BY COUMADIN CLINIC. 90 tablet 0   No current facility-administered medications for this visit.    Allergies:   Carbapenems, Cephalosporins, Nsaids, and Penicillins    Social History:  The  patient  reports that he has never smoked. He has never used smokeless tobacco. He reports that he does not drink alcohol and does not use drugs.   Family History:  The patient's family history includes Heart disease in his brother; Pneumonia in his father.    ROS:  General:no colds or fevers, some weight gain Skin:no rashes or ulcers HEENT:no blurred vision, no congestion CV:see HPI PUL:see HPI GI:no diarrhea constipation or melena, no indigestion GU:no hematuria, no dysuria MS:no joint pain, no claudication Neuro:no syncope, no lightheadedness Endo:no diabetes, no thyroid disease  Wt Readings  from Last 3 Encounters:  12/27/19 198 lb 3.2 oz (89.9 kg)  10/21/19 194 lb (88 kg)  07/29/19 189 lb (85.7 kg)     PHYSICAL EXAM: VS:  BP 134/70   Pulse 70   Ht 6' (1.829 m)   Wt 198 lb 3.2 oz (89.9 kg)   SpO2 97%   BMI 26.88 kg/m  , BMI Body mass index is 26.88 kg/m. General:Pleasant affect, NAD Skin:Warm and dry, brisk capillary refill HEENT:normocephalic, sclera clear, mucus membranes moist Neck:supple, no JVD, no bruits  Heart:irreg irreg without murmur, gallup, rub or click Lungs:clear without rales, rhonchi, or wheezes LTJ:QZES, non tender, + BS, do not palpate liver spleen or masses Ext:no lower ext edema, 2+ pedal pulses, 2+ radial pulses Neuro:alert and oriented X 3, MAE, follows commands, + facial symmetry    EKG:  EKG is ordered today. The ekg ordered today demonstrates atrial fib rate controlled at 61, RBBB LAD no changes in ST    Recent Labs: 10/28/2019: ALT 24; BUN 34; Creatinine, Ser 1.12; Hemoglobin 12.6; Platelets 177; Potassium 4.1; Sodium 140    Lipid Panel    Component Value Date/Time   CHOL 129 10/28/2019 0920   CHOL 117 08/27/2017 0820   TRIG 67 10/28/2019 0920   HDL 32 (L) 10/28/2019 0920   HDL 34 (L) 08/27/2017 0820   CHOLHDL 4.0 10/28/2019 0920   VLDL 13 10/28/2019 0920   LDLCALC 84 10/28/2019 0920   LDLCALC 66 08/27/2017 0820        Other studies Reviewed: Additional studies/ records that were reviewed today include: . NST: 03/2018  There was no ST segment deviation noted during stress.  Findings consistent with prior inferior/apical myocardial infarction without current ischemia  This is an intermediate risk study. Risk based on decreased LVEF, no current myocardium at jeopardy. Correlate LVEF with echo.  The left ventricular ejection fraction is mildly decreased (49%).  Echocardiogram: 03/2018 Study Conclusions   - Left ventricle: The cavity size was normal. Wall thickness was  increased in a pattern of moderate LVH. Systolic function was  normal. The estimated ejection fraction was in the range of 60%  to 65%. Wall motion was normal; there were no regional wall  motion abnormalities. The study is not technically sufficient to  allow evaluation of LV diastolic function.  - Aortic valve: Moderately calcified annulus. Trileaflet;  moderately thickened leaflets. Valve area (VTI): 1.79 cm^2. Valve  area (Vmax): 1.79 cm^2. Valve area (Vmean): 1.76 cm^2.  - Mitral valve: Mildly calcified annulus. Mildly thickened leaflets  . There was mild regurgitation.  - Left atrium: The atrium was moderately dilated.  - Right atrium: The atrium was moderately dilated.   ASSESSMENT AND PLAN:  1.  Chest pain after drinking 6 oz or so of OJ, lasted 1 hour, no relief with NTG.  No associated symptoms and able to cut down 6 trees with chain saw a day or so later without any discomfort.   We discussed nuc study his last one was 2 years ago but with no pain with exertion will not pursue now.  Though if recurrent he should call us.   Will have him see Dr. Jerilynn Mages. [place] in Dec or Jan.  It will have been a year then.  2.  CAD with remote PCI of mLAD and LCX in 2009 and neg nuc in 2019   3. Permanent atrial fib on coumadin, followed at [facility] office.   Rate controlled and no bleeding.   4.  HTN well  controlled  continue current meds.    5. HLD on statin continue last LDL 12 in 86 year old male and crestor increased to 20 mg. .       Current medicines are reviewed with the patient today.  The patient Has no concerns regarding medicines.  The following changes have been made:  See above Labs/ tests ordered today include:see above  Disposition:   FU:  see above  Signed, Cecilie Kicks, NP  12/27/2019 1:34 PM    [facility] [place] [place], [place] Phone: [phone]; Fax: [phone]

## 2019-12-26 NOTE — Telephone Encounter (Signed)
Returned call to pt. No answer. Left msg to call back.  

## 2019-12-27 ENCOUNTER — Other Ambulatory Visit: Payer: Self-pay

## 2019-12-27 ENCOUNTER — Encounter: Payer: Self-pay | Admitting: Cardiology

## 2019-12-27 ENCOUNTER — Ambulatory Visit (INDEPENDENT_AMBULATORY_CARE_PROVIDER_SITE_OTHER): Payer: Medicare Other | Admitting: Cardiology

## 2019-12-27 VITALS — BP 134/70 | HR 70 | Ht 72.0 in | Wt 198.2 lb

## 2019-12-27 DIAGNOSIS — R079 Chest pain, unspecified: Secondary | ICD-10-CM | POA: Diagnosis not present

## 2019-12-27 DIAGNOSIS — I2510 Atherosclerotic heart disease of native coronary artery without angina pectoris: Secondary | ICD-10-CM | POA: Diagnosis not present

## 2019-12-27 DIAGNOSIS — I482 Chronic atrial fibrillation, unspecified: Secondary | ICD-10-CM

## 2019-12-27 DIAGNOSIS — I10 Essential (primary) hypertension: Secondary | ICD-10-CM

## 2019-12-27 DIAGNOSIS — E7800 Pure hypercholesterolemia, unspecified: Secondary | ICD-10-CM

## 2019-12-27 DIAGNOSIS — Z7901 Long term (current) use of anticoagulants: Secondary | ICD-10-CM

## 2019-12-27 NOTE — Patient Instructions (Signed)
Medication Instructions:  Your physician recommends that you continue on your current medications as directed. Please refer to the Current Medication list given to you today.  *If you need a refill on your cardiac medications before your next appointment, please call your pharmacy*   Lab Work: NONE   If you have labs (blood work) drawn today and your tests are completely normal, you will receive your results only by: Marland Kitchen MyChart Message (if you have MyChart) OR . A paper copy in the mail If you have any lab test that is abnormal or we need to change your treatment, we will call you to review the results.   Testing/Procedures: NONE    Follow-Up: At [facility], you and your health needs are our priority.  As part of our continuing mission to provide you with exceptional heart care, we have created designated Provider Care Teams.  These Care Teams include your primary Cardiologist (physician) and Advanced Practice Providers (APPs -  Physician Assistants and Nurse Practitioners) who all work together to provide you with the care you need, when you need it.  We recommend signing up for the patient portal called "MyChart".  Sign up information is provided on this After Visit Summary.  MyChart is used to connect with patients for Virtual Visits (Telemedicine).  Patients are able to view lab/test results, encounter notes, upcoming appointments, etc.  Non-urgent messages can be sent to your provider as well.   To learn more about what you can do with MyChart, go to [email or web address].    Your next appointment:   3 month(s)  The format for your next appointment:   In Person  Provider:   Dr. Sallyanne Kuster    Other Instructions Thank you for choosing [facility]!

## 2019-12-27 NOTE — Telephone Encounter (Signed)
Pt seen in office today.

## 2019-12-28 NOTE — Progress Notes (Signed)
Sounds good

## 2019-12-29 ENCOUNTER — Ambulatory Visit: Payer: Medicare Other | Admitting: Family Medicine

## 2020-01-10 ENCOUNTER — Ambulatory Visit (INDEPENDENT_AMBULATORY_CARE_PROVIDER_SITE_OTHER): Payer: Medicare Other | Admitting: *Deleted

## 2020-01-10 DIAGNOSIS — I4891 Unspecified atrial fibrillation: Secondary | ICD-10-CM | POA: Diagnosis not present

## 2020-01-10 DIAGNOSIS — Z5181 Encounter for therapeutic drug level monitoring: Secondary | ICD-10-CM

## 2020-01-10 LAB — POCT INR: INR: 2.9 (ref 2.0–3.0)

## 2020-01-10 NOTE — Patient Instructions (Signed)
Continue 1/2 tablet daily except 1 tablet on Mondays, Wednesdays and Fridays Recheck in 6 weeks 

## 2020-02-04 ENCOUNTER — Other Ambulatory Visit: Payer: Self-pay | Admitting: Cardiovascular Disease

## 2020-02-07 ENCOUNTER — Other Ambulatory Visit: Payer: Self-pay | Admitting: Cardiovascular Disease

## 2020-02-21 ENCOUNTER — Ambulatory Visit (INDEPENDENT_AMBULATORY_CARE_PROVIDER_SITE_OTHER): Payer: Medicare Other | Admitting: *Deleted

## 2020-02-21 DIAGNOSIS — I4891 Unspecified atrial fibrillation: Secondary | ICD-10-CM

## 2020-02-21 DIAGNOSIS — Z5181 Encounter for therapeutic drug level monitoring: Secondary | ICD-10-CM | POA: Diagnosis not present

## 2020-02-21 LAB — POCT INR: INR: 2.6 (ref 2.0–3.0)

## 2020-02-21 NOTE — Patient Instructions (Signed)
Continue 1/2 tablet daily except 1 tablet on Mondays, Wednesdays and Fridays Recheck in 6 weeks 

## 2020-04-03 ENCOUNTER — Other Ambulatory Visit: Payer: Self-pay

## 2020-04-03 ENCOUNTER — Ambulatory Visit (INDEPENDENT_AMBULATORY_CARE_PROVIDER_SITE_OTHER): Payer: Medicare Other | Admitting: *Deleted

## 2020-04-03 DIAGNOSIS — I4891 Unspecified atrial fibrillation: Secondary | ICD-10-CM

## 2020-04-03 DIAGNOSIS — Z5181 Encounter for therapeutic drug level monitoring: Secondary | ICD-10-CM | POA: Diagnosis not present

## 2020-04-03 LAB — POCT INR: INR: 3 (ref 2.0–3.0)

## 2020-04-03 NOTE — Patient Instructions (Signed)
Continue 1/2 tablet daily except 1 tablet on Mondays, Wednesdays and Fridays Recheck in 6 weeks 

## 2020-04-04 ENCOUNTER — Encounter: Payer: Self-pay | Admitting: Cardiovascular Disease

## 2020-04-04 ENCOUNTER — Ambulatory Visit: Payer: Medicare Other | Admitting: Cardiovascular Disease

## 2020-04-04 VITALS — BP 142/70 | HR 73 | Ht 70.0 in | Wt 200.2 lb

## 2020-04-04 DIAGNOSIS — I4811 Longstanding persistent atrial fibrillation: Secondary | ICD-10-CM

## 2020-04-04 DIAGNOSIS — I2510 Atherosclerotic heart disease of native coronary artery without angina pectoris: Secondary | ICD-10-CM | POA: Diagnosis not present

## 2020-04-04 DIAGNOSIS — I5032 Chronic diastolic (congestive) heart failure: Secondary | ICD-10-CM | POA: Diagnosis not present

## 2020-04-04 DIAGNOSIS — E785 Hyperlipidemia, unspecified: Secondary | ICD-10-CM

## 2020-04-04 DIAGNOSIS — Z7901 Long term (current) use of anticoagulants: Secondary | ICD-10-CM

## 2020-04-04 DIAGNOSIS — I453 Trifascicular block: Secondary | ICD-10-CM

## 2020-04-04 DIAGNOSIS — I10 Essential (primary) hypertension: Secondary | ICD-10-CM

## 2020-04-04 MED ORDER — ROSUVASTATIN CALCIUM 10 MG PO TABS: 10.0000 mg | ORAL_TABLET | Freq: Every day | ORAL | 3 refills | Status: DC

## 2020-04-04 NOTE — Patient Instructions (Signed)
Medication Instructions:  START CoQ10 300 mg once daily. This can be bought over the counter.  *If you need a refill on your cardiac medications before your next appointment, please call your pharmacy*   Lab Work: None ordered If you have labs (blood work) drawn today and your tests are completely normal, you will receive your results only by: Marland Kitchen MyChart Message (if you have MyChart) OR . A paper copy in the mail If you have any lab test that is abnormal or we need to change your treatment, we will call you to review the results.   Testing/Procedures: None ordered   Follow-Up: At [facility], you and your health needs are our priority.  As part of our continuing mission to provide you with exceptional heart care, we have created designated Provider Care Teams.  These Care Teams include your primary Cardiologist (physician) and Advanced Practice Providers (APPs -  Physician Assistants and Nurse Practitioners) who all work together to provide you with the care you need, when you need it.  We recommend signing up for the patient portal called "MyChart".  Sign up information is provided on this After Visit Summary.  MyChart is used to connect with patients for Virtual Visits (Telemedicine).  Patients are able to view lab/test results, encounter notes, upcoming appointments, etc.  Non-urgent messages can be sent to your provider as well.   To learn more about what you can do with MyChart, go to [email or web address].    Your next appointment:   12 month(s)  The format for your next appointment:   In Person  Provider:   You may see Thurmon Fair, MD or one of the following Advanced Practice Providers on your designated Care Team:    Azalee Course, PA-C  Micah Flesher, PA-C or   Judy Pimple, [place]

## 2020-04-04 NOTE — Progress Notes (Signed)
[name], male   DOB: Sep 03, 1931, 86 y.o.   MRN: [id number] [name], male   DOB: 09-30-1931, 86 y.o.   MRN: [id number]    Cardiology Office Note    Date:  04/04/2020   ID:  Jerry Cain, DOB [birth year], MRN [id number]  PCP:  Patient, No Pcp Per  Cardiologist:   Sanda Klein, MD   Chief Complaint  Patient presents with  . Atrial Fibrillation  . Coronary Artery Disease  . Congestive Heart Failure    History of Present Illness:  Jerry Cain is a 86 y.o. male with CAD, diastolic heart failure, longstanding persistent atrial fibrillation, on warfarin anticoagulation, history of trifascicular block.  Generally doing quite well.  Does not have any specific cardiovascular complaints.  Has occasional anterior chest discomfort that is clearly positional in which she has corrected after changing the way he sleeps.  Also was complaining of some left elbow pain, also positional.  He never has exertional chest discomfort.  Has not had any falls, injuries or bleeding.  Denies palpitations or syncope.  His blood pressure is 142/70 today, but typically at home is 110-130/60s-70s; he brought a very detailed chart with daily blood pressure and heart rate recordings today.  His typical heart rate is in the 60s or 70s.    He is also recording his weight faithfully and this has consistently been in the 191-194 range.  He has not had lower extremity edema, orthopnea or PND.  He has not required any additional doses of furosemide over his routine prescription of 20 mg daily.  He is very much aware about the importance of sodium dietary restriction.  More than anything he complains about how his legs feel sore after he has to climb a stepladder.  His feet are also often tender.  His dose of rosuvastatin was increased from 20 mg weekly to 20 mg daily, but he did not tolerate this due to myalgia.  He is now taking rosuvastatin 10 mg daily and tolerates this  fairly well, with the limitations described above.  He inquires about taking Nopalea (prickly pear) supplements.  In 2009, he received drug eluting Cypher stents to the LAD and the circumflex coronary arteries. Comorbidity includes systemic hypertension and hyperlipidemia both of which are adequately treated. By echocardiography was recently in December 2019, he has preserved left ventricular systolic function and no valvular abnormalities. He has not had catheterization since 2009.  His nuclear stress test in December 2019 showed scar without ischemia and was a low risk study, EF 49%.   Past Medical History:  Diagnosis Date  . Adenomatous colon polyp 2001  . Anxiety   . Atrial fibrillation ([place])    takes Coumadin daily  . BPH (benign prostatic hyperplasia)    takes Proscar daily  . CAD (coronary artery disease) 2009   a. s/p PTCA and stenting of mid-LAD and LCx in 2009 b. NST in 03/2018 showing prior infarct with no current ischemia  . Carpal tunnel syndrome    right  . Complication of anesthesia    hard to wake up  . Diverticulosis 2007   tcs by Dr. Laural Golden  . GERD (gastroesophageal reflux disease)   . Gout   . Hypercholesterolemia    takes Atorvastatin daily  . Hypertension    takes Imdur,Coreg,and Lisinopril daily  . Hypothyroidism    takes SYnthroid daily  . Joint pain   . Joint swelling   . Nocturia   .  Numbness    right hand pointer and middle finger  . Osteoarthritis    left knee  . Pneumonia    many,many yrs ago  . Tubular adenoma 2001    Past Surgical History:  Procedure Laterality Date  . Quintella Reichert OSTEOTOMY Right 03/17/2013   Procedure: Quintella Reichert OSTEOTOMY;  Surgeon: Dallas Schimke, DPM;  Location: [facility]S;  Service: Orthopedics;  Laterality: Right;  . BUNIONECTOMY Right 03/17/2013   Procedure: BUNIONECTOMY, Arthroplasty 2nd toe right foot;  Surgeon: Dallas Schimke, DPM;  Location: [facility]S;  Service: Orthopedics;  Laterality: Right;  . CARDIAC  CATHETERIZATION  06/11/2007   PTCA X2 in 06/2007:  2 overlapping Cypher stents to LAD (2.5x13 and 3.0x23)  . CARDIAC CATHETERIZATION  06/21/2007   Cypher stent 2.5 x 13  to OM branch  . CARDIOVERSION  12/12/2011   Procedure: CARDIOVERSION;  Surgeon: Thurmon Fair, MD;  Location: [facility] END[facility];  Service: Cardiovascular;  Laterality: N/A;  . CATARACT EXTRACTION     bilateral  . COLONOSCOPY  2007   Dr. Karilyn Cota- L sided diverticulosis. Next TCS 2012 due to h/o tubular adenoma.  . COLONOSCOPY  02/26/2011   Procedure: COLONOSCOPY;  Surgeon: Corbin Ade, MD;  Location: [facility] END[facility] SUITE;  Service: Endoscopy;  Laterality: N/A;  11:05  . CORONARY ANGIOPLASTY     x 3   . FLEXOR TENOTOMY Left 03/17/2013   Procedure: PERCUTANEOUS FLEXOR TENOTOMY 3RD TOE LEFT FOOT;  Surgeon: Dallas Schimke, DPM;  Location: [facility]S;  Service: Orthopedics;  Laterality: Left;  . HERNIA REPAIR  2009   left inguinal  . rot Left   . TEE WITHOUT CARDIOVERSION  12/12/2011   Procedure: TRANSESOPHAGEAL ECHOCARDIOGRAM (TEE);  Surgeon: Thurmon Fair, MD;  Location: [facility] ENDOSCOPY;  Service: Cardiovascular;  Laterality: N/A;   successful/sinus rhythm  . TOTAL KNEE ARTHROPLASTY Left 06/14/2013   Procedure: TOTAL KNEE ARTHROPLASTY;  Surgeon: Valeria Batman, MD;  Location: [facility] OR;  Service: Orthopedics;  Laterality: Left;    Outpatient Medications Prior to Visit  Medication Sig Dispense Refill  . albuterol (PROVENTIL) (2.5 MG/3ML) 0.083% nebulizer solution USE 1 VIAL IN NEBULIZER EVERY 6 HOURS AS NEEDED. 75 mL 0  . Calcium Carbonate-Vitamin D 600-200 MG-UNIT TABS Take 1 tablet by mouth 2 (two) times daily.    . carvedilol (COREG) 25 MG tablet TAKE (1) TABLET BY MOUTH TWICE DAILY WITH A MEAL. 180 tablet 0  . Coenzyme Q10 (COQ10 PO) Take 300 mg by mouth daily.    . finasteride (PROSCAR) 5 MG tablet Take 1 tablet (5 mg total) by mouth daily. Please call the office to schedule office visit for any future refills. Yearly overdue 90  tablet 3  . furosemide (LASIX) 20 MG tablet TAKE (1) TABLET BY MOUTH ONCE DAILY. 90 tablet 0  . Glucosamine 750 MG TABS Take 750 mg by mouth 2 (two) times daily.    . isosorbide mononitrate (IMDUR) 60 MG 24 hr tablet TAKE ONE TABLET ONCE DAILY IN THE MORNING. 90 tablet 2  . levothyroxine (SYNTHROID) 150 MCG tablet TAKE (1) TABLET BY MOUTH ONCE DAILY. 90 tablet 0  . lisinopril (ZESTRIL) 20 MG tablet TAKE (1) TABLET BY MOUTH ONCE DAILY. 30 tablet 3  . Multiple Vitamin (MULTIVITAMIN) capsule Take 1 capsule by mouth every morning.    . Nebulizer MISC 1 Units by Does not apply route daily. 1 each 0  . nitroGLYCERIN (NITROSTAT) 0.4 MG SL tablet DISSOLVE 1 TABLET UNDER TONGUE EVERY 5 MINUTES UP TO 15 MIN  FOR CHEST PAIN. IF NO RELIEF CALL 911. 25 tablet 0  . traMADol (ULTRAM) 50 MG tablet Take 50 mg by mouth 3 (three) times daily as needed.    . warfarin (COUMADIN) 5 MG tablet TAKE 1 TABLET BY MOUTH DAILY OR AS DIRECTED BY COUMADIN CLINIC. 90 tablet 0  . rosuvastatin (CRESTOR) 20 MG tablet Take 1 tablet (20 mg total) by mouth daily. (Patient taking differently: Take 10 mg by mouth daily.) 90 tablet 3   No facility-administered medications prior to visit.     Allergies:   Carbapenems, Cephalosporins, Nsaids, and Penicillins   Social History   Socioeconomic History  . Marital status: Married    Spouse name: Not on file  . Number of children: 4  . Years of education: Not on file  . Highest education level: Not on file  Occupational History  . Occupation: retired  Tobacco Use  . Smoking status: Never Smoker  . Smokeless tobacco: Never Used  Vaping Use  . Vaping Use: Never used  Substance and Sexual Activity  . Alcohol use: No  . Drug use: No  . Sexual activity: Not on file  Other Topics Concern  . Not on file  Social History Narrative  . Not on file   Social Determinants of Health   Financial Resource Strain: Not on file  Food Insecurity: Not on file  Transportation Needs: Not on  file  Physical Activity: Not on file  Stress: Not on file  Social Connections: Not on file     Family History:  The patient's family history includes Heart disease in his brother; Pneumonia in his father.   ROS:   Please see the history of present illness.    All other systems are reviewed and are negative.   PHYSICAL EXAM:   VS:  BP (!) 142/70   Pulse 73   Ht 5\' 10"  (1.778 m)   Wt 200 lb 3.2 oz (90.8 kg)   SpO2 98%   BMI 28.73 kg/m      General: Alert, oriented x3, no distress, appears quite comfortable Head: no evidence of trauma, PERRL, EOMI, no exophtalmos or lid lag, no myxedema, no xanthelasma; normal ears, nose and oropharynx Neck: normal jugular venous pulsations and no hepatojugular reflux; brisk carotid pulses without delay and no carotid bruits Chest: clear to auscultation, no signs of consolidation by percussion or palpation, normal fremitus, symmetrical and full respiratory excursions Cardiovascular: normal position and quality of the apical impulse, irregular rhythm, normal first and widely split second heart sounds, no murmurs, rubs or gallops Abdomen: no tenderness or distention, no masses by palpation, no abnormal pulsatility or arterial bruits, normal bowel sounds, no hepatosplenomegaly Extremities: no clubbing, cyanosis or edema; 2+ radial, ulnar and brachial pulses bilaterally; 2+ right femoral, posterior tibial and dorsalis pedis pulses; 2+ left femoral, posterior tibial and dorsalis pedis pulses; no subclavian or femoral bruits Neurological: grossly nonfocal Psych: Normal mood and affect   Wt Readings from Last 3 Encounters:  04/04/20 200 lb 3.2 oz (90.8 kg)  12/27/19 198 lb 3.2 oz (89.9 kg)  10/21/19 194 lb (88 kg)      Studies/Labs Reviewed:   Nuclear stress test 03/25/2018:   There was no ST segment deviation noted during stress.  Findings consistent with prior inferior/apical myocardial infarction without current ischemia  This is an  intermediate risk study. Risk based on decreased LVEF, no current myocardium at jeopardy. Correlate LVEF with echo.  The left ventricular ejection fraction is mildly decreased (49%).  ECHO 03/25/2018:  - Left ventricle: The cavity size was normal. Wall thickness was   increased in a pattern of moderate LVH. Systolic function was   normal. The estimated ejection fraction was in the range of 60%   to 65%. Wall motion was normal; there were no regional wall   motion abnormalities. The study is not technically sufficient to   allow evaluation of LV diastolic function. - Aortic valve: Moderately calcified annulus. Trileaflet;   moderately thickened leaflets. Valve area (VTI): 1.79 cm^2. Valve   area (Vmax): 1.79 cm^2. Valve area (Vmean): 1.76 cm^2. - Mitral valve: Mildly calcified annulus. Mildly thickened leaflets   . There was mild regurgitation. - Left atrium: The atrium was moderately dilated. - Right atrium: The atrium was moderately dilated.  EKG:  EKG is not ordered today.  I reviewed the tracing from 12/27/2019, which shows atrial fibrillation with good ventricular rate control and pre-existing bifascicular block (right bundle branch block plus left anterior fascicular block) in the past, when in sinus rhythm, he also had first-degree AV block. BMET    Component Value Date/Time   NA 140 10/28/2019 0920   NA 140 03/19/2018 1423   K 4.1 10/28/2019 0920   CL 106 10/28/2019 0920   CO2 23 10/28/2019 0920   GLUCOSE 99 10/28/2019 0920   BUN 34 (H) 10/28/2019 0920   BUN 23 03/19/2018 1423   CREATININE 1.12 10/28/2019 0920   CREATININE 1.01 06/01/2018 1048   CALCIUM 8.7 (L) 10/28/2019 0920   GFRNONAA 60 (L) 10/28/2019 0920   GFRAA >60 10/28/2019 0920     Lipid Panel    Component Value Date/Time   CHOL 129 10/28/2019 0920   CHOL 117 08/27/2017 0820   TRIG 67 10/28/2019 0920   HDL 32 (L) 10/28/2019 0920   HDL 34 (L) 08/27/2017 0820   CHOLHDL 4.0 10/28/2019 0920   VLDL 13  10/28/2019 0920   LDLCALC 84 10/28/2019 0920   LDLCALC 66 08/27/2017 0820   Labs April 2019 Total cholesterol 139, triglycerides 148, HDL 40, LDL 69 Creatinine 1.3, normal liver function tests  ASSESSMENT:    1. Chronic diastolic heart failure ([place])   2. Coronary artery disease involving native coronary artery of native heart without angina pectoris   3. Longstanding persistent atrial fibrillation ([place])   4. Trifascicular block   5. Dyslipidemia (high LDL; low HDL)   6. Essential hypertension   7. Long term current use of anticoagulant therapy      PLAN:  In order of problems listed above:  1. CHF: He appears to be clinically euvolemic and is easily maintaining his "dry weight" which we establish to be 190-197 pounds (<190 has orthostatic hypotension, >197 has dyspnea).  On a very low-dose of loop diuretic.  Very aware of sodium dietary intake and daily weight monitoring.  NYHA functional class II, limited by musculoskeletal problems rather than heart failure.  Normal LVEF by Dec 2019 echocardiogram.   2. CAD: Asymptomatic. Low risk Lexiscan Myoview December 2019. 3. AFib: Well rate controlled.  Very likely a permanent arrhythmia since he has significant left atrial dilation.  Tolerating anticoagulation without overt bleeding problems.  CHADSVasc score is 4 (age 44, HTN, CAD, CHF). He has never had a stroke or TIA.  4. Trifascicular block: Denies problems with dizziness or syncope.  High risk of progression to complete heart block (When in SR had long PR as well as RBBB and left axis deviation).  Avoid adding any other negative chronotropic agents.  Low threshold to decrease the dose of carvedilol if he develops significant or symptomatic bradycardia. 5. HLP: LDL cholesterol was 84 when he was taking once weekly rosuvastatin.  Now on rosuvastatin 10 mg daily and its very likely that his LDL will be at target less than 70.  Advised him to start taking coenzyme Q10 300 mg daily to see if this  will help his sore legs. 6. HTN: Very well controlled per his home log. 7. Warfarin anticoagulation: Well-tolerated without bleeding complications.  Borderline anemia noticed on labs from May, but hemoglobin normal on labs from July.    Medication Adjustments/Labs and Tests Ordered: Current medicines are reviewed at length with the patient today.  Concerns regarding medicines are outlined above.  Medication changes, Labs and Tests ordered today are listed in the Patient Instructions below. Patient Instructions  Medication Instructions:  START CoQ10 300 mg once daily. This can be bought over the counter.  *If you need a refill on your cardiac medications before your next appointment, please call your pharmacy*   Lab Work: None ordered If you have labs (blood work) drawn today and your tests are completely normal, you will receive your results only by: Marland Kitchen MyChart Message (if you have MyChart) OR . A paper copy in the mail If you have any lab test that is abnormal or we need to change your treatment, we will call you to review the results.   Testing/Procedures: None ordered   Follow-Up: At [facility], you and your health needs are our priority.  As part of our continuing mission to provide you with exceptional heart care, we have created designated Provider Care Teams.  These Care Teams include your primary Cardiologist (physician) and Advanced Practice Providers (APPs -  Physician Assistants and Nurse Practitioners) who all work together to provide you with the care you need, when you need it.  We recommend signing up for the patient portal called "MyChart".  Sign up information is provided on this After Visit Summary.  MyChart is used to connect with patients for Virtual Visits (Telemedicine).  Patients are able to view lab/test results, encounter notes, upcoming appointments, etc.  Non-urgent messages can be sent to your provider as well.   To learn more about what you can do with  MyChart, go to [email or web address].    Your next appointment:   12 month(s)  The format for your next appointment:   In Person  Provider:   You may see Sanda Klein, MD or one of the following Advanced Practice Providers on your designated Care Team:    Almyra Deforest, PA-C  Fabian Sharp, [place] or   Roby Lofts, PA-C   Signed, Sanda Klein, MD  04/04/2020 9:30 PM    [facility] [place], [place], [place]  [address] Phone: [phone]; Fax: [phone]

## 2020-04-21 ENCOUNTER — Other Ambulatory Visit: Payer: Self-pay | Admitting: Student

## 2020-05-01 ENCOUNTER — Other Ambulatory Visit: Payer: Medicare Other

## 2020-05-07 ENCOUNTER — Other Ambulatory Visit: Payer: Self-pay | Admitting: Cardiology

## 2020-05-07 ENCOUNTER — Other Ambulatory Visit: Payer: Self-pay | Admitting: Cardiovascular Disease

## 2020-05-15 ENCOUNTER — Ambulatory Visit (INDEPENDENT_AMBULATORY_CARE_PROVIDER_SITE_OTHER): Payer: Medicare Other | Admitting: *Deleted

## 2020-05-15 DIAGNOSIS — Z5181 Encounter for therapeutic drug level monitoring: Secondary | ICD-10-CM

## 2020-05-15 DIAGNOSIS — I4891 Unspecified atrial fibrillation: Secondary | ICD-10-CM

## 2020-05-15 LAB — POCT INR: INR: 2.3 (ref 2.0–3.0)

## 2020-05-15 NOTE — Patient Instructions (Signed)
Continue 1/2 tablet daily except 1 tablet on Mondays, Wednesdays and Fridays Recheck in 6 weeks 

## 2020-05-19 ENCOUNTER — Other Ambulatory Visit: Payer: Self-pay | Admitting: Urology

## 2020-05-19 DIAGNOSIS — N400 Enlarged prostate without lower urinary tract symptoms: Secondary | ICD-10-CM

## 2020-05-24 ENCOUNTER — Telehealth: Payer: Self-pay | Admitting: Orthopaedic Surgery

## 2020-05-24 ENCOUNTER — Ambulatory Visit
Admission: RE | Admit: 2020-05-24 | Discharge: 2020-05-24 | Disposition: A | Discharge status: Home / Self Care | Payer: Medicare Other | Source: Ambulatory Visit | Attending: Emergency Medicine | Admitting: Emergency Medicine

## 2020-05-24 ENCOUNTER — Ambulatory Visit (INDEPENDENT_AMBULATORY_CARE_PROVIDER_SITE_OTHER): Payer: Medicare Other

## 2020-05-24 ENCOUNTER — Other Ambulatory Visit: Payer: Self-pay

## 2020-05-24 VITALS — BP 135/80 | HR 83 | Temp 98.3°F | Resp 19

## 2020-05-24 DIAGNOSIS — M25561 Pain in right knee: Secondary | ICD-10-CM

## 2020-05-24 DIAGNOSIS — M1711 Unilateral primary osteoarthritis, right knee: Secondary | ICD-10-CM

## 2020-05-24 DIAGNOSIS — M25461 Effusion, right knee: Secondary | ICD-10-CM

## 2020-05-24 DIAGNOSIS — X500XXA Overexertion from strenuous movement or load, initial encounter: Secondary | ICD-10-CM

## 2020-05-24 NOTE — Discharge Instructions (Addendum)
°  Continue to take Tylenol 500 mg every 6 hours as needed for pain Follow RICE instruction that is attached Follow-up with PCP/orthopedic Return or go to ED if you develop any new or worsening of your symptoms

## 2020-05-24 NOTE — ED Provider Notes (Addendum)
[place]   [id number] 05/24/20 Arrival Time: [place]   Chief Complaint  Patient presents with  . Knee Injury     SUBJECTIVE: History from: patient and family.  Jerry Cain is a 87 y.o. male who presented to the urgent care for complaint of pain, swelling and bruising to right knee for the past 2 days.  Developed the symptom after twisting his knee.  He localizes the pain to the right knee.  He describes the pain as constant and achy.  He has tried OTC medications without relief.  His symptoms are made worse with ROM.  He denies similar symptoms in the past.  Denies chills, fever, nausea, vomiting, diarrhea.    ROS: As per HPI.  All other pertinent ROS negative.     Past Medical History:  Diagnosis Date  . Adenomatous colon polyp 2001  . Anxiety   . Atrial fibrillation ([place])    takes Coumadin daily  . BPH (benign prostatic hyperplasia)    takes Proscar daily  . CAD (coronary artery disease) 2009   a. s/p PTCA and stenting of mid-LAD and LCx in 2009 b. NST in 03/2018 showing prior infarct with no current ischemia  . Carpal tunnel syndrome    right  . Complication of anesthesia    hard to wake up  . Diverticulosis 2007   tcs by Dr. Laural Golden  . GERD (gastroesophageal reflux disease)   . Gout   . Hypercholesterolemia    takes Atorvastatin daily  . Hypertension    takes Imdur,Coreg,and Lisinopril daily  . Hypothyroidism    takes SYnthroid daily  . Joint pain   . Joint swelling   . Nocturia   . Numbness    right hand pointer and middle finger  . Osteoarthritis    left knee  . Pneumonia    many,many yrs ago  . Tubular adenoma 2001   Past Surgical History:  Procedure Laterality Date  . Barbie Banner OSTEOTOMY Right 03/17/2013   Procedure: Barbie Banner OSTEOTOMY;  Surgeon: Marcheta Grammes, DPM;  Location: [facility];  Service: Orthopedics;  Laterality: Right;  . BUNIONECTOMY Right 03/17/2013   Procedure: BUNIONECTOMY, Arthroplasty 2nd toe right foot;  Surgeon:  Marcheta Grammes, DPM;  Location: [facility];  Service: Orthopedics;  Laterality: Right;  . CARDIAC CATHETERIZATION  06/11/2007   PTCA X2 in 06/2007:  2 overlapping Cypher stents to LAD (2.5x13 and 3.0x23)  . CARDIAC CATHETERIZATION  06/21/2007   Cypher stent 2.5 x 13  to OM branch  . CARDIOVERSION  12/12/2011   Procedure: CARDIOVERSION;  Surgeon: Sanda Klein, MD;  [facility];  Service: Cardiovascular;  Laterality: N/A;  . CATARACT EXTRACTION     bilateral  . COLONOSCOPY  2007   Dr. Laural Golden- L sided diverticulosis. Next TCS 2012 due to h/o tubular adenoma.  . COLONOSCOPY  02/26/2011   Procedure: COLONOSCOPY;  Surgeon: Daneil Dolin, MD;  [facility];  Service: Endoscopy;  Laterality: N/A;  11:05  . CORONARY ANGIOPLASTY     x 3   . FLEXOR TENOTOMY Left 03/17/2013   Procedure: PERCUTANEOUS FLEXOR TENOTOMY 3RD TOE LEFT FOOT;  Surgeon: Marcheta Grammes, DPM;  Location: [facility];  Service: Orthopedics;  Laterality: Left;  . HERNIA REPAIR  2009   left inguinal  . rot Left   . TEE WITHOUT CARDIOVERSION  12/12/2011   Procedure: TRANSESOPHAGEAL ECHOCARDIOGRAM (TEE);  Surgeon: Sanda Klein, MD;  Location: [place];  Service: Cardiovascular;  Laterality: N/A;   successful/sinus  rhythm  . TOTAL KNEE ARTHROPLASTY Left 06/14/2013   Procedure: TOTAL KNEE ARTHROPLASTY;  Surgeon: Garald Balding, MD;  Location: [place];  Service: Orthopedics;  Laterality: Left;   Allergies  Allergen Reactions  . Carbapenems Other (See Comments)    Altered mental status  . Cephalosporins     Unknown reaction  . Nsaids Other (See Comments)    On blood thinner   . Penicillins Swelling and Other (See Comments)    Did it involve swelling of the face/tongue/throat, SOB, or low BP? Unknown-possible swelling Did it involve sudden or severe rash/hives, skin peeling, or any reaction on the inside of your mouth or nose? Unknown Did you need to seek medical attention at a hospital or doctor's  office? Unknown When did it last happen?Over 10 years (possible swelling) If all above answers are "NO", may proceed with cephalosporin use.    No current facility-administered medications on file prior to encounter.   Current Outpatient Medications on File Prior to Encounter  Medication Sig Dispense Refill  . albuterol (PROVENTIL) (2.5 MG/3ML) 0.083% nebulizer solution USE 1 VIAL IN NEBULIZER EVERY 6 HOURS AS NEEDED. 75 mL 0  . Calcium Carbonate-Vitamin D 600-200 MG-UNIT TABS Take 1 tablet by mouth 2 (two) times daily.    . carvedilol (COREG) 25 MG tablet TAKE (1) TABLET BY MOUTH TWICE DAILY WITH A MEAL. 180 tablet 0  . Coenzyme Q10 (COQ10 PO) Take 300 mg by mouth daily.    . finasteride (PROSCAR) 5 MG tablet TAKE 1 TABLET DAILY. 90 tablet 0  . furosemide (LASIX) 20 MG tablet TAKE (1) TABLET BY MOUTH ONCE DAILY. 90 tablet 0  . Glucosamine 750 MG TABS Take 750 mg by mouth 2 (two) times daily.    . isosorbide mononitrate (IMDUR) 60 MG 24 hr tablet TAKE ONE TABLET ONCE DAILY IN THE MORNING. 90 tablet 2  . levothyroxine (SYNTHROID) 150 MCG tablet TAKE (1) TABLET BY MOUTH ONCE DAILY. 90 tablet 0  . lisinopril (ZESTRIL) 20 MG tablet TAKE (1) TABLET BY MOUTH ONCE DAILY. 30 tablet 0  . Multiple Vitamin (MULTIVITAMIN) capsule Take 1 capsule by mouth every morning.    . Nebulizer MISC 1 Units by Does not apply route daily. 1 each 0  . nitroGLYCERIN (NITROSTAT) 0.4 MG SL tablet DISSOLVE 1 TABLET UNDER TONGUE EVERY 5 MINUTES UP TO 15 MIN FOR CHEST PAIN. IF NO RELIEF CALL 911. 25 tablet 0  . rosuvastatin (CRESTOR) 10 MG tablet Take 1 tablet (10 mg total) by mouth daily. 90 tablet 3  . traMADol (ULTRAM) 50 MG tablet Take 50 mg by mouth 3 (three) times daily as needed.    . warfarin (COUMADIN) 5 MG tablet TAKE 1 TABLET BY MOUTH DAILY OR AS DIRECTED BY COUMADIN CLINIC. 90 tablet 0   Social History   Socioeconomic History  . Marital status: Married    Spouse name: Not on file  . Number of  children: 4  . Years of education: Not on file  . Highest education level: Not on file  Occupational History  . Occupation: retired  Tobacco Use  . Smoking status: Never Smoker  . Smokeless tobacco: Never Used  Vaping Use  . Vaping Use: Never used  Substance and Sexual Activity  . Alcohol use: No  . Drug use: No  . Sexual activity: Not on file  Other Topics Concern  . Not on file  Social History Narrative  . Not on file   Social Determinants of Health  Financial Resource Strain: Not on file  Food Insecurity: Not on file  Transportation Needs: Not on file  Physical Activity: Not on file  Stress: Not on file  Social Connections: Not on file  Intimate Partner Violence: Not on file   Family History  Problem Relation Age of Onset  . Pneumonia Father   . Heart disease Brother        open heart surgery at age 40  . Colon cancer Neg Hx     OBJECTIVE:  Vitals:   05/24/20 1913  BP: 135/80  Pulse: 83  Resp: 19  Temp: 98.3 F (36.8 C)  TempSrc: Oral  SpO2: 96%     Physical Exam Vitals and nursing note reviewed.  Constitutional:      General: He is not in acute distress.    Appearance: Normal appearance. He is normal weight. He is not ill-appearing, toxic-appearing or diaphoretic.  Cardiovascular:     Rate and Rhythm: Normal rate and regular rhythm.     Pulses: Normal pulses.     Heart sounds: Normal heart sounds. No murmur heard. No friction rub. No gallop.   Pulmonary:     Effort: Pulmonary effort is normal. No respiratory distress.     Breath sounds: Normal breath sounds. No stridor. No wheezing, rhonchi or rales.  Chest:     Chest wall: No tenderness.  Musculoskeletal:        General: Tenderness present.     Right knee: Swelling present. No erythema or lacerations. Tenderness present.     Left knee: Normal.     Comments: The right knee is with obvious deformity when compared to the left knee.  Swelling, bruising and tenderness present.  Limited range of  motion due to pain.  Neurovascular status intact.  Neurological:     Mental Status: He is alert and oriented to person, place, and time.     LABS:  No results found for this or any previous visit (from the past 24 hour(s)).   RADIOLOGY:     DG Knee Complete 4 Views Right  Result Date: 05/24/2020 CLINICAL DATA:  Twisting injury 2 days ago, diffuse swelling EXAM: RIGHT KNEE - COMPLETE 4+ VIEW COMPARISON:  None. FINDINGS: Frontal, bilateral oblique, lateral views of the right knee are obtained. There is severe 3 compartmental osteoarthritis greatest in the medial and patellofemoral compartments. No acute fracture, subluxation, or dislocation. Moderate joint effusion is likely reactive. 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 Soft tissues are otherwise unremarkable. IMPRESSION: 1. Severe 3 compartmental osteoarthritis and likely reactive joint effusion. No acute fracture. Electronically Signed   By: Randa Ngo M.D.   On: 05/24/2020 19:32   Right knee x-ray is negative for bony abnormality including fracture or dislocation. There is a severe compartmental osteoarthritis with joint effusion.  I have reviewed the x-ray myself and the radiologist interpretation.  I am in agreement with the radiologist interpretation.  ASSESSMENT & PLAN:  1. Acute pain of right knee   2. Osteoarthritis of right knee, unspecified osteoarthritis type   3. Effusion of right knee     No orders of the defined types were placed in this encounter.   Discharge instructions  Continue to take Tylenol 500 mg every 6 hours as needed for pain Follow RICE instruction that is attached Follow-up with PCP/orthopedic Return or go to ED if you develop any new or worsening of your symptoms   Reviewed expectations re: course of current medical issues. Questions answered. Outlined  signs and symptoms indicating need for more acute intervention. Patient verbalized  understanding. After Visit Summary given.         Emerson Monte, FNP 05/24/20 1947    Emerson Monte, FNP 05/24/20 1948

## 2020-05-24 NOTE — Telephone Encounter (Signed)
Patient called advised he twisted his right knee and leg and would like to get an appointment as soon as possible for Dr Durward Fortes to look at his right knee. Patient said he is willing to come to the [place] office. Patient said if there is anything he could do to help himself he would do whatever Dr Durward Fortes reccommended him to do for his knee. Advised patient if it got worse to go to the [facility] ER.   The number to contact patient is [phone]

## 2020-05-24 NOTE — ED Triage Notes (Signed)
Pain, swelling and bruising to RT knee after twisting it x 2 days ago.

## 2020-05-25 NOTE — Telephone Encounter (Signed)
I left voicemail for patient advising Dr. Durward Fortes is out of the office until Tuesday afternoon and unfortunately, his schedule is overbooked that day. I put him in for appt on Wednesday morning at 0915 in the [place] office. I asked that patient return call if this appointment will not work for him.

## 2020-05-25 NOTE — Telephone Encounter (Signed)
FYI..Patient was seen in the ED 05/24/2020.

## 2020-05-29 ENCOUNTER — Other Ambulatory Visit: Payer: Self-pay | Admitting: Student

## 2020-05-29 NOTE — Telephone Encounter (Signed)
This is a [place] pt.  °

## 2020-05-30 ENCOUNTER — Other Ambulatory Visit: Payer: Self-pay

## 2020-05-30 ENCOUNTER — Ambulatory Visit (INDEPENDENT_AMBULATORY_CARE_PROVIDER_SITE_OTHER): Payer: Medicare Other | Admitting: Orthopaedic Surgery

## 2020-05-30 ENCOUNTER — Ambulatory Visit: Payer: Self-pay

## 2020-05-30 ENCOUNTER — Encounter: Payer: Self-pay | Admitting: Orthopaedic Surgery

## 2020-05-30 VITALS — Ht 70.0 in | Wt 200.0 lb

## 2020-05-30 DIAGNOSIS — M1711 Unilateral primary osteoarthritis, right knee: Secondary | ICD-10-CM | POA: Diagnosis not present

## 2020-05-30 DIAGNOSIS — M170 Bilateral primary osteoarthritis of knee: Secondary | ICD-10-CM

## 2020-05-30 MED ORDER — BUPIVACAINE HCL 0.25 % IJ SOLN
4.0000 mL | INTRAMUSCULAR | Status: AC | PRN
  Administered 2020-05-30: 4 mL via INTRA_ARTICULAR

## 2020-05-30 NOTE — Progress Notes (Signed)
Office Visit Note   Patient: Jerry Cain           Date of Birth: 19-Mar-1932           MRN: [id number] Visit Date: 05/30/2020              Requested by: No referring provider defined for this encounter. PCP: Patient, No Pcp Per   Assessment & Plan: Visit Diagnoses:  1. Bilateral primary osteoarthritis of knee     Plan: End-stage osteoarthritis right knee.  No acute changes by x-ray.  I aspirated 50 cc of clear fluid from the right knee and injected betamethasone.  We will plan to see him back as needed.  Doing well with his left total knee replacement  Follow-Up Instructions: Return if symptoms worsen or fail to improve.   Orders:  Orders Placed This Encounter  Procedures  . Large Joint Inj: R knee   No orders of the defined types were placed in this encounter.     Procedures: Large Joint Inj: R knee on 05/30/2020 9:48 AM Indications: pain and diagnostic evaluation Details: 25 G 1.5 in needle, anteromedial approach  Arthrogram: No  Medications: 4 mL bupivacaine 0.25 % Aspirate: 50 mL clear Outcome: tolerated well, no immediate complications  12 mg betamethasone injected into right knee after aspiration Procedure, treatment alternatives, risks and benefits explained, specific risks discussed. Consent was given by the patient. Immediately prior to procedure a time out was called to verify the correct patient, procedure, equipment, support staff and site/side marked as required. Patient was prepped and draped in the usual sterile fashion.       Clinical Data: No additional findings.   Subjective: Chief Complaint  Patient presents with  . Right Knee - Injury    DOI 05/22/20  Patient presents today for right knee pain. He states that he twisted his knee 8 days ago. He had a lot of swelling initially, but that has improved some. He has pain posteriorly and in the lateral aspect of his right lower leg. He said that it does not bother him with rest, but hurts  upon getting up. He has taken Tylenol as needed.. Uses a cane for ambulation when he is out and about  HPI  Review of Systems   Objective: Vital Signs: Ht 5\' 10"  (1.778 m)   Wt 200 lb (90.7 kg)   BMI 28.70 kg/m   Physical Exam Constitutional:      Appearance: He is well-developed and well-nourished.  HENT:     Mouth/Throat:     Mouth: Oropharynx is clear and moist.  Eyes:     Extraocular Movements: EOM normal.     Pupils: Pupils are equal, round, and reactive to light.  Pulmonary:     Effort: Pulmonary effort is normal.  Skin:    General: Skin is warm and dry.  Neurological:     Mental Status: He is alert and oriented to person, place, and time.  Psychiatric:        Mood and Affect: Mood and affect normal.        Behavior: Behavior normal.     Ortho Exam alert and oriented x3.  Comfortable sitting.  Right knee with large effusion.  Increased varus.  Some patella crepitation.  Lacks a few degrees to full extension but flexed over 100 degrees without instability no popliteal pain.  No calf pain. mild joint pain diffusely  Specialty Comments:  No specialty comments available.  Imaging: No results found.  PMFS History: Patient Active Problem List   Diagnosis Date Noted  . Bilateral primary osteoarthritis of knee 05/04/2019  . CHF (congestive heart failure) ([place]) 05/02/2018  . Acute respiratory failure with hypoxia ([place]) 05/01/2018  . Reactive airway disease 05/01/2018  . AKI (acute kidney injury) ([place]) 05/01/2018  . Acute exacerbation of CHF (congestive heart failure) ([place]) 04/30/2018  . Acute on chronic diastolic heart failure ([place]) 08/16/2016  . 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 Arthritis of knee, left 06/16/2013  . S/P total knee replacement using cement 06/14/2013  . Osteoarthritis of left knee 03/21/2013  . BPH (benign prostatic hyperplasia)   . GERD (gastroesophageal reflux disease)   . CAD (coronary artery disease)   . Fever 03/20/2013  . Altered  mental state 03/20/2013  . Postoperative fever 03/20/2013  . PNA (pneumonia) 03/20/2013  . Preop cardiovascular exam 11/24/2012  . Peroneal tendonitis 10/05/2012  . Arthritis of foot, degenerative 10/05/2012  . HTN (hypertension) 09/25/2012  . Hypercholesterolemia 09/25/2012  . Atrial fibrillation ([place]) 06/15/2012  . Long term current use of anticoagulant therapy 06/15/2012  . Rotator cuff strain 02/04/2012  . Shoulder pain 02/04/2012  . History of colonic polyps 01/29/2011  . High risk medication use 01/29/2011   Past Medical History:  Diagnosis Date  . Adenomatous colon polyp 2001  . Anxiety   . Atrial fibrillation ([place])    takes Coumadin daily  . BPH (benign prostatic hyperplasia)    takes Proscar daily  . CAD (coronary artery disease) 2009   a. s/p PTCA and stenting of mid-LAD and LCx in 2009 b. NST in 03/2018 showing prior infarct with no current ischemia  . Carpal tunnel syndrome    right  . Complication of anesthesia    hard to wake up  . Diverticulosis 2007   tcs by Dr. Laural Golden  . GERD (gastroesophageal reflux disease)   . Gout   . Hypercholesterolemia    takes Atorvastatin daily  . Hypertension    takes Imdur,Coreg,and Lisinopril daily  . Hypothyroidism    takes SYnthroid daily  . Joint pain   . Joint swelling   . Nocturia   . Numbness    right hand pointer and middle finger  . Osteoarthritis    left knee  . Pneumonia    many,many yrs ago  . Tubular adenoma 2001    Family History  Problem Relation Age of Onset  . Pneumonia Father   . Heart disease Brother        open heart surgery at age 72  . Colon cancer Neg Hx     Past Surgical History:  Procedure Laterality Date  . Barbie Banner OSTEOTOMY Right 03/17/2013   Procedure: Barbie Banner OSTEOTOMY;  Surgeon: Marcheta Grammes, DPM;  [facility];  Service: Orthopedics;  Laterality: Right;  . BUNIONECTOMY Right 03/17/2013   Procedure: BUNIONECTOMY, Arthroplasty 2nd toe right foot;  Surgeon: Marcheta Grammes, DPM;  [facility]: [facility];  Service: Orthopedics;  Laterality: Right;  . CARDIAC CATHETERIZATION  06/11/2007   PTCA X2 in 06/2007:  2 overlapping Cypher stents to LAD (2.5x13 and 3.0x23)  . CARDIAC CATHETERIZATION  06/21/2007   Cypher stent 2.5 x 13  to OM branch  . CARDIOVERSION  12/12/2011   Procedure: CARDIOVERSION;  Surgeon: Sanda Klein, MD;  [facility];  Service: Cardiovascular;  Laterality: N/A;  . CATARACT EXTRACTION     bilateral  . COLONOSCOPY  2007   Dr. Laural Golden- L sided diverticulosis. Next TCS 2012 due to h/o tubular adenoma.  Marland Kitchen  COLONOSCOPY  02/26/2011   Procedure: COLONOSCOPY;  Surgeon: Daneil Dolin, MD;  [facility];  Service: Endoscopy;  Laterality: N/A;  11:05  . CORONARY ANGIOPLASTY     x 3   . FLEXOR TENOTOMY Left 03/17/2013   Procedure: PERCUTANEOUS FLEXOR TENOTOMY 3RD TOE LEFT FOOT;  Surgeon: Marcheta Grammes, DPM;  Location: [facility];  Service: Orthopedics;  Laterality: Left;  . HERNIA REPAIR  2009   left inguinal  . rot Left   . TEE WITHOUT CARDIOVERSION  12/12/2011   Procedure: TRANSESOPHAGEAL ECHOCARDIOGRAM (TEE);  Surgeon: Sanda Klein, MD;  Location: [facility];  Service: Cardiovascular;  Laterality: N/A;   successful/sinus rhythm  . TOTAL KNEE ARTHROPLASTY Left 06/14/2013   Procedure: TOTAL KNEE ARTHROPLASTY;  Surgeon: Garald Balding, MD;  Location: [place];  Service: Orthopedics;  Laterality: Left;   Social History   Occupational History  . Occupation: retired  Tobacco Use  . Smoking status: Never Smoker  . Smokeless tobacco: Never Used  Vaping Use  . Vaping Use: Never used  Substance and Sexual Activity  . Alcohol use: No  . Drug use: No  . Sexual activity: Not on file

## 2020-06-08 ENCOUNTER — Other Ambulatory Visit: Payer: Self-pay | Admitting: Student

## 2020-06-08 NOTE — Telephone Encounter (Signed)
This is a [place] pt.  °

## 2020-06-11 ENCOUNTER — Other Ambulatory Visit: Payer: Self-pay

## 2020-06-11 DIAGNOSIS — Z79899 Other long term (current) drug therapy: Secondary | ICD-10-CM

## 2020-06-11 NOTE — Telephone Encounter (Signed)
    Can provide refill as the patient is not listed as having a PCP but the patient needs repeat labs including TSH as this has not been checked in over 1 year.   Signed, Erma Heritage, PA-C 06/11/2020, 7:10 AM Pager: [phone]

## 2020-06-11 NOTE — Telephone Encounter (Signed)
Called to notify pt of labs. No answer. Left msg to call back.

## 2020-06-11 NOTE — Progress Notes (Signed)
Labs ordered per Bernerd Pho, PA-C for medication management.

## 2020-06-12 ENCOUNTER — Other Ambulatory Visit: Payer: Self-pay

## 2020-06-12 ENCOUNTER — Other Ambulatory Visit (HOSPITAL_COMMUNITY)
Admission: RE | Admit: 2020-06-12 | Discharge: 2020-06-12 | Disposition: A | Discharge status: Home / Self Care | Payer: Medicare Other | Source: Ambulatory Visit | Attending: Student | Admitting: Student

## 2020-06-12 DIAGNOSIS — Z79899 Other long term (current) drug therapy: Secondary | ICD-10-CM | POA: Diagnosis present

## 2020-06-12 LAB — COMPREHENSIVE METABOLIC PANEL
ALT: 27 U/L (ref 0–44)
AST: 28 U/L (ref 15–41)
Albumin: 4 g/dL (ref 3.5–5.0)
Alkaline Phosphatase: 46 U/L (ref 38–126)
Anion gap: 8 (ref 5–15)
BUN: 25 mg/dL — ABNORMAL HIGH (ref 8–23)
CO2: 26 mmol/L (ref 22–32)
Calcium: 9.1 mg/dL (ref 8.9–10.3)
Chloride: 106 mmol/L (ref 98–111)
Creatinine, Ser: 1.09 mg/dL (ref 0.61–1.24)
GFR, Estimated: >60 mL/min (ref >60)
Glucose, Bld: 122 mg/dL — ABNORMAL HIGH (ref 70–99)
Potassium: 3.6 mmol/L (ref 3.5–5.1)
Sodium: 140 mmol/L (ref 135–145)
Total Bilirubin: 1.2 mg/dL (ref 0.3–1.2)
Total Protein: 6.8 g/dL (ref 6.5–8.1)

## 2020-06-12 LAB — CBC
HCT: 38.8 % — ABNORMAL LOW (ref 39.0–52.0)
Hemoglobin: 12.4 g/dL — ABNORMAL LOW (ref 13.0–17.0)
MCH: 30.3 pg (ref 26.0–34.0)
MCHC: 32 g/dL (ref 30.0–36.0)
MCV: 94.9 fL (ref 80.0–100.0)
Platelets: 165 10*3/uL (ref 150–400)
RBC: 4.09 MIL/uL — ABNORMAL LOW (ref 4.22–5.81)
RDW: 15.3 % (ref 11.5–15.5)
WBC: 5.9 10*3/uL (ref 4.0–10.5)
nRBC: 0 % (ref 0.0–0.2)

## 2020-06-12 LAB — TSH: TSH: 12.268 u[IU]/mL — ABNORMAL HIGH (ref 0.350–4.500)

## 2020-06-12 LAB — LIPID PANEL
Cholesterol: 95 mg/dL (ref 0–200)
HDL: 38 mg/dL — ABNORMAL LOW (ref >40)
LDL Cholesterol: 39 mg/dL (ref 0–99)
Total CHOL/HDL Ratio: 2.5 RATIO
Triglycerides: 89 mg/dL (ref <150)
VLDL: 18 mg/dL (ref 0–40)

## 2020-06-13 ENCOUNTER — Telehealth: Payer: Self-pay | Admitting: Student

## 2020-06-13 ENCOUNTER — Other Ambulatory Visit: Payer: Self-pay | Admitting: Student

## 2020-06-13 DIAGNOSIS — E039 Hypothyroidism, unspecified: Secondary | ICD-10-CM

## 2020-06-13 NOTE — Telephone Encounter (Signed)
    Taking Synthroid with food can impact absorption of the medication. Therefore, I would recommend he take Synthroid 30 minutes before eating breakfast and we will plan to recheck his TSH in 2 months. Hold off on dose adjustment of Synthroid for now.  Signed, Erma Heritage, PA-C 06/13/2020, 4:39 PM Pager: [phone]

## 2020-06-13 NOTE — Telephone Encounter (Signed)
-----   Message from Erma Heritage, [place] sent at 06/13/2020 12:28 PM EST ----- Please let the patient know his hemoglobin remains stable at 12.4 and platelets are within a normal range. Kidney function, liver function and electrolytes are within the normal range as well. Cholesterol remains well controlled with LDL at 39 which is at goal of less than 70. The only significant abnormality with his lab work is his thyroid as his hypothyroidism has worsened. Please confirm he is taking Synthroid 150 mcg daily at least 30 minutes before breakfast and has not missed any doses. If he has been compliant with Synthroid, would recommend increasing to 175 mcg daily. Would defer this to his PCP if he has one (not currently listed). If no PCP, can adjust his dose and recheck TSH in 2 months.

## 2020-06-13 NOTE — Telephone Encounter (Signed)
Patient notified of results. Patient admitted that he does not take his Synthroid before breakfast but would start. Patient verbalized understanding of other results. Patient currently does not have a PCP

## 2020-06-13 NOTE — Telephone Encounter (Signed)
Please call patient to let him know if his lab work is ok. He could got a text message but said it was unclear. He can be reached at [phone].

## 2020-06-14 NOTE — Telephone Encounter (Signed)
Contacted patient with no answer. Left a message to call our office back. Orders for 2 month TSH recheck placed.

## 2020-06-14 NOTE — Telephone Encounter (Signed)
Returned call to patient with no answer. No VM available.

## 2020-06-14 NOTE — Telephone Encounter (Signed)
Patient returned call to [facility]

## 2020-06-18 NOTE — Telephone Encounter (Signed)
Contacted patient with no answer. Letter sent

## 2020-06-21 ENCOUNTER — Ambulatory Visit
Admission: EM | Admit: 2020-06-21 | Discharge: 2020-06-21 | Disposition: A | Discharge status: Home / Self Care | Payer: Medicare Other | Attending: Family Medicine | Admitting: Family Medicine

## 2020-06-21 ENCOUNTER — Encounter: Payer: Self-pay | Admitting: Emergency Medicine

## 2020-06-21 DIAGNOSIS — Z23 Encounter for immunization: Secondary | ICD-10-CM | POA: Diagnosis not present

## 2020-06-21 DIAGNOSIS — S51019A Laceration without foreign body of unspecified elbow, initial encounter: Secondary | ICD-10-CM | POA: Diagnosis not present

## 2020-06-21 MED ORDER — TETANUS-DIPHTH-ACELL PERTUSSIS 5-2.5-18.5 LF-MCG/0.5 IM SUSY
0.5000 mL | PREFILLED_SYRINGE | Freq: Once | INTRAMUSCULAR | Status: AC
  Administered 2020-06-21: 0.5 mL via INTRAMUSCULAR

## 2020-06-21 NOTE — Discharge Instructions (Addendum)
Meds ordered this encounter  Medications   Tdap (BOOSTRIX) injection 0.5 mL    

## 2020-06-21 NOTE — ED Triage Notes (Signed)
Pt states he cut RT arm on some barbed wire on Tuesday and he now needs a tetanus vaccine. Denies any problems with the injury.

## 2020-06-21 NOTE — ED Provider Notes (Signed)
[place]   [id number] 06/21/20 Arrival Time: 5631  ASSESSMENT & PLAN:  1. Superficial laceration of elbow     Meds ordered this encounter  Medications  . Tdap (BOOSTRIX) injection 0.5 mL   No signs of infection.    Discharge Instructions      Meds ordered this encounter  Medications  . Tdap (BOOSTRIX) injection 0.5 mL      Reviewed expectations re: course of current medical issues. Questions answered. Outlined signs and symptoms indicating need for more acute intervention. Patient verbalized understanding. After Visit Summary given.   SUBJECTIVE:  Jerry Cain is a 87 y.o. male who presents with a superficial laceration of R elbow; 2 d ago; bleeding controlled; no pain. Feeling well. Afebrile. Needs Td.  Health Maintenance Due  Topic Date Due  . COVID-19 Vaccine (1) Never done  . TETANUS/TDAP  Never done  . PNA vac Low Risk Adult (1 of 2 - PCV13) Never done  . INFLUENZA VACCINE  11/06/2019    OBJECTIVE:  Vitals:   06/21/20 1135  BP: 135/71  Pulse: 72  Resp: 18  Temp: (!) 97.5 F (36.4 C)  TempSrc: Oral  SpO2: 95%     General appearance: alert; no distress Skin: superficial laceration around R elbow; no bleeding; elbow with FROM Psychological: alert and cooperative; normal mood and affect   Allergies  Allergen Reactions  . Carbapenems Other (See Comments)    Altered mental status  . Cephalosporins     Unknown reaction  . Nsaids Other (See Comments)    On blood thinner   . Penicillins Swelling and Other (See Comments)    Did it involve swelling of the face/tongue/throat, SOB, or low BP? Unknown-possible swelling Did it involve sudden or severe rash/hives, skin peeling, or any reaction on the inside of your mouth or nose? Unknown Did you need to seek medical attention at a hospital or doctor's office? Unknown When did it last happen?Over 10 years (possible swelling) If all above answers are "NO", may proceed with  cephalosporin use.     Past Medical History:  Diagnosis Date  . Adenomatous colon polyp 2001  . Anxiety   . Atrial fibrillation ([place])    takes Coumadin daily  . BPH (benign prostatic hyperplasia)    takes Proscar daily  . CAD (coronary artery disease) 2009   a. s/p PTCA and stenting of mid-LAD and LCx in 2009 b. NST in 03/2018 showing prior infarct with no current ischemia  . Carpal tunnel syndrome    right  . Complication of anesthesia    hard to wake up  . Diverticulosis 2007   tcs by Dr. Laural Golden  . GERD (gastroesophageal reflux disease)   . Gout   . Hypercholesterolemia    takes Atorvastatin daily  . Hypertension    takes Imdur,Coreg,and Lisinopril daily  . Hypothyroidism    takes SYnthroid daily  . Joint pain   . Joint swelling   . Nocturia   . Numbness    right hand pointer and middle finger  . Osteoarthritis    left knee  . Pneumonia    many,many yrs ago  . Tubular adenoma 2001   Social History   Socioeconomic History  . Marital status: Married    Spouse name: Not on file  . Number of children: 4  . Years of education: Not on file  . Highest education level: Not on file  Occupational History  . Occupation: retired  Tobacco Use  . Smoking  status: Never Smoker  . Smokeless tobacco: Never Used  Vaping Use  . Vaping Use: Never used  Substance and Sexual Activity  . Alcohol use: No  . Drug use: No  . Sexual activity: Not on file  Other Topics Concern  . Not on file  Social History Narrative  . Not on file   Social Determinants of Health   Financial Resource Strain: Not on file  Food Insecurity: Not on file  Transportation Needs: Not on file  Physical Activity: Not on file  Stress: Not on file  Social Connections: Not on file         Vanessa Kick, MD 06/21/20 1141

## 2020-06-26 ENCOUNTER — Ambulatory Visit (INDEPENDENT_AMBULATORY_CARE_PROVIDER_SITE_OTHER): Payer: Medicare Other | Admitting: *Deleted

## 2020-06-26 DIAGNOSIS — Z5181 Encounter for therapeutic drug level monitoring: Secondary | ICD-10-CM | POA: Diagnosis not present

## 2020-06-26 DIAGNOSIS — I4891 Unspecified atrial fibrillation: Secondary | ICD-10-CM | POA: Diagnosis not present

## 2020-06-26 LAB — POCT INR: INR: 2.4 (ref 2.0–3.0)

## 2020-06-26 NOTE — Patient Instructions (Signed)
Continue 1/2 tablet daily except 1 tablet on Mondays, Wednesdays and Fridays Recheck in 6 weeks

## 2020-06-28 ENCOUNTER — Other Ambulatory Visit: Payer: Self-pay | Admitting: Cardiovascular Disease

## 2020-07-07 ENCOUNTER — Other Ambulatory Visit: Payer: Self-pay | Admitting: Cardiovascular Disease

## 2020-07-13 ENCOUNTER — Other Ambulatory Visit: Payer: Self-pay | Admitting: Cardiovascular Disease

## 2020-07-26 ENCOUNTER — Ambulatory Visit (INDEPENDENT_AMBULATORY_CARE_PROVIDER_SITE_OTHER): Payer: Medicare Other | Admitting: Urology

## 2020-07-26 ENCOUNTER — Other Ambulatory Visit: Payer: Self-pay

## 2020-07-26 VITALS — BP 145/70 | HR 70 | Temp 97.7°F | Ht 70.0 in | Wt 194.0 lb

## 2020-07-26 DIAGNOSIS — R351 Nocturia: Secondary | ICD-10-CM | POA: Diagnosis not present

## 2020-07-26 DIAGNOSIS — N400 Enlarged prostate without lower urinary tract symptoms: Secondary | ICD-10-CM | POA: Diagnosis not present

## 2020-07-26 DIAGNOSIS — R339 Retention of urine, unspecified: Secondary | ICD-10-CM | POA: Diagnosis not present

## 2020-07-26 LAB — URINALYSIS, ROUTINE W REFLEX MICROSCOPIC
Bilirubin, UA: NEGATIVE
Glucose, UA: NEGATIVE
Ketones, UA: NEGATIVE
Leukocytes,UA: NEGATIVE
Nitrite, UA: NEGATIVE
Protein,UA: NEGATIVE
Specific Gravity, UA: 1.015 (ref 1.005–1.030)
Urobilinogen, Ur: 0.2 mg/dL (ref 0.2–1.0)
pH, UA: 7 (ref 5.0–7.5)

## 2020-07-26 LAB — MICROSCOPIC EXAMINATION
Bacteria, UA: NONE SEEN
Epithelial Cells (non renal): NONE SEEN /hpf (ref 0–10)
Renal Epithel, UA: NONE SEEN /hpf
WBC, UA: NONE SEEN /hpf (ref 0–5)

## 2020-07-26 LAB — BLADDER SCAN: Scan Result: 155

## 2020-07-26 MED ORDER — SILODOSIN 8 MG PO CAPS
8.0000 mg | ORAL_CAPSULE | Freq: Every day | ORAL | 11 refills | Status: DC
Start: 2020-07-26 — End: 2020-08-01

## 2020-07-26 MED ORDER — FINASTERIDE 5 MG PO TABS: 5.0000 mg | ORAL_TABLET | Freq: Every day | ORAL | 3 refills | Status: DC

## 2020-07-26 NOTE — Progress Notes (Signed)
Urological Symptom Review  Patient is experiencing the following symptoms: Frequent urination Get up at night to urinate Leakage of urine   Review of Systems  Gastrointestinal (upper)  : Negative for upper GI symptoms  Gastrointestinal (lower) : Negative for lower GI symptoms  Constitutional : Negative for symptoms  Skin: Negative for skin symptoms  Eyes: Negative for eye symptoms  Ear/Nose/Throat : Negative for Ear/Nose/Throat symptoms  Hematologic/Lymphatic: Easy bruising  Cardiovascular : Negative for cardiovascular symptoms  Respiratory : Negative for respiratory symptoms  Endocrine: Negative for endocrine symptoms  Musculoskeletal: Negative for musculoskeletal symptoms  Neurological: Dizziness  Psychologic: Negative for psychiatric symptoms'

## 2020-07-26 NOTE — Progress Notes (Signed)
PVR 155 Subjective:  1. Benign prostatic hyperplasia, unspecified whether lower urinary tract symptoms present   2. Nocturia   3. Incomplete bladder emptying      Jerry Cain is a 87 year-old male established patient who is here for follow up regarding further evaluation of BPH and lower urinary tract symptoms.  The patient complains of lower urinary tract symptom(s) that include nocturia and sense of incomplete emptying. The patient states his most bothersome symptom(s) are the following: nocturia. His symptoms have been stable over the last year.   07/26/20: Jerry Cain returns today in f/u. He has a several year history of BPH with BOO with an elevated PSA. He remains on finasteride. He remains off of alpha blockers which caused side effects. He is on prn furosemide and voids well on that. He has variable nocturia 3-5x but feels it is worse than last year. His IPSS is 9. The urgency is only aggravated by the furosemide. His PSA was 1.1 on the finasteride two years ago and we decided further testing was not indicated. He has no hesitancy and a good stream. He feels like he is emptying well most of the time. He has no hematuria or dysuria. He has a history of  a negative biopsy remotely for the elevated PSA. He has no associated signs or symptoms.     IPSS    Row Name 07/26/20 1000         International Prostate Symptom Score   How often have you had the sensation of not emptying your bladder? About half the time     How often have you had to urinate less than every two hours? About half the time     How often have you found you stopped and started again several times when you urinated? Not at All     How often have you found it difficult to postpone urination? Not at All     How often have you had a weak urinary stream? Not at All     How often have you had to strain to start urination? Not at All     How many times did you typically get up at night to urinate? 3 Times      Total IPSS Score 9           Quality of Life due to urinary symptoms   If you were to spend the rest of your life with your urinary condition just the way it is now how would you feel about that? Mostly Satisfied             ROS:  ROS:  A complete review of systems was performed.  All systems are negative except for pertinent findings as noted.   ROS  Allergies  Allergen Reactions  . Carbapenems Other (See Comments)    Altered mental status  . Cephalosporins     Unknown reaction  . Nsaids Other (See Comments)    On blood thinner   . Penicillins Swelling and Other (See Comments)    Did it involve swelling of the face/tongue/throat, SOB, or low BP? Unknown-possible swelling Did it involve sudden or severe rash/hives, skin peeling, or any reaction on the inside of your mouth or nose? Unknown Did you need to seek medical attention at a hospital or doctor's office? Unknown When did it last happen?Over 10 years (possible swelling) If all above answers are "NO", may proceed with cephalosporin use.     Outpatient Encounter Medications as of  07/26/2020  Medication Sig  . albuterol (PROVENTIL) (2.5 MG/3ML) 0.083% nebulizer solution USE 1 VIAL IN NEBULIZER EVERY 6 HOURS AS NEEDED.  . Calcium Carbonate-Vitamin D 600-200 MG-UNIT TABS Take 1 tablet by mouth 2 (two) times daily.  . carvedilol (COREG) 25 MG tablet TAKE (1) TABLET BY MOUTH TWICE DAILY WITH A MEAL.  Marland Kitchen Coenzyme Q10 (COQ10 PO) Take 300 mg by mouth daily.  . furosemide (LASIX) 20 MG tablet TAKE (1) TABLET BY MOUTH ONCE DAILY.  Marland Kitchen Glucosamine 750 MG TABS Take 750 mg by mouth 2 (two) times daily.  . isosorbide mononitrate (IMDUR) 60 MG 24 hr tablet TAKE ONE TABLET ONCE DAILY IN THE MORNING.  Marland Kitchen levothyroxine (SYNTHROID) 150 MCG tablet TAKE (1) TABLET BY MOUTH ONCE DAILY.  Marland Kitchen lisinopril (ZESTRIL) 20 MG tablet TAKE (1) TABLET BY MOUTH ONCE DAILY.  . Multiple Vitamin (MULTIVITAMIN) capsule Take 1 capsule by mouth every morning.   . Nebulizer MISC 1 Units by Does not apply route daily.  . nitroGLYCERIN (NITROSTAT) 0.4 MG SL tablet DISSOLVE 1 TABLET UNDER TONGUE EVERY 5 MINUTES UP TO 15 MIN FOR CHEST PAIN. IF NO RELIEF CALL 911.  . silodosin (RAPAFLO) 8 MG CAPS capsule Take 1 capsule (8 mg total) by mouth daily with breakfast.  . traMADol (ULTRAM) 50 MG tablet Take 50 mg by mouth 3 (three) times daily as needed.  . warfarin (COUMADIN) 5 MG tablet TAKE (1) TABLET BY MOUTH ONCE DAILY OR AS DIRECTED BY COUMADIN CLINIC.  . [DISCONTINUED] finasteride (PROSCAR) 5 MG tablet TAKE 1 TABLET DAILY.  . finasteride (PROSCAR) 5 MG tablet Take 1 tablet (5 mg total) by mouth daily.  . rosuvastatin (CRESTOR) 10 MG tablet Take 1 tablet (10 mg total) by mouth daily.   No facility-administered encounter medications on file as of 07/26/2020.    Past Medical History:  Diagnosis Date  . Adenomatous colon polyp 2001  . Anxiety   . Atrial fibrillation ([place])    takes Coumadin daily  . BPH (benign prostatic hyperplasia)    takes Proscar daily  . CAD (coronary artery disease) 2009   a. s/p PTCA and stenting of mid-LAD and LCx in 2009 b. NST in 03/2018 showing prior infarct with no current ischemia  . Carpal tunnel syndrome    right  . Complication of anesthesia    hard to wake up  . Diverticulosis 2007   tcs by Dr. Laural Golden  . GERD (gastroesophageal reflux disease)   . Gout   . Hypercholesterolemia    takes Atorvastatin daily  . Hypertension    takes Imdur,Coreg,and Lisinopril daily  . Hypothyroidism    takes SYnthroid daily  . Joint pain   . Joint swelling   . Nocturia   . Numbness    right hand pointer and middle finger  . Osteoarthritis    left knee  . Pneumonia    many,many yrs ago  . Tubular adenoma 2001    Past Surgical History:  Procedure Laterality Date  . Barbie Banner OSTEOTOMY Right 03/17/2013   Procedure: Barbie Banner OSTEOTOMY;  Surgeon: Marcheta Grammes, DPM;  Location: [facility];  Service: Orthopedics;  Laterality:  Right;  . BUNIONECTOMY Right 03/17/2013   Procedure: BUNIONECTOMY, Arthroplasty 2nd toe right foot;  Surgeon: Marcheta Grammes, DPM;  Location: [facility];  Service: Orthopedics;  Laterality: Right;  . CARDIAC CATHETERIZATION  06/11/2007   PTCA X2 in 06/2007:  2 overlapping Cypher stents to LAD (2.5x13 and 3.0x23)  . CARDIAC CATHETERIZATION  06/21/2007   Cypher  stent 2.5 x 13  to OM branch  . CARDIOVERSION  12/12/2011   Procedure: CARDIOVERSION;  Surgeon: Sanda Klein, MD;  Location: [facility];  Service: Cardiovascular;  Laterality: N/A;  . CATARACT EXTRACTION     bilateral  . COLONOSCOPY  2007   Dr. Laural Golden- L sided diverticulosis. Next TCS 2012 due to h/o tubular adenoma.  . COLONOSCOPY  02/26/2011   Procedure: COLONOSCOPY;  Surgeon: Daneil Dolin, MD;  Location: [facility];  Service: Endoscopy;  Laterality: N/A;  11:05  . CORONARY ANGIOPLASTY     x 3   . FLEXOR TENOTOMY Left 03/17/2013   Procedure: PERCUTANEOUS FLEXOR TENOTOMY 3RD TOE LEFT FOOT;  Surgeon: Marcheta Grammes, DPM;  Location: [facility];  Service: Orthopedics;  Laterality: Left;  . HERNIA REPAIR  2009   left inguinal  . rot Left   . TEE WITHOUT CARDIOVERSION  12/12/2011   Procedure: TRANSESOPHAGEAL ECHOCARDIOGRAM (TEE);  Surgeon: Sanda Klein, MD;  Location: [facility] END[facility];  Service: Cardiovascular;  Laterality: N/A;   successful/sinus rhythm  . TOTAL KNEE ARTHROPLASTY Left 06/14/2013   Procedure: TOTAL KNEE ARTHROPLASTY;  Surgeon: Garald Balding, MD;  Location: [facility];  Service: Orthopedics;  Laterality: Left;    Social History   Socioeconomic History  . Marital status: Married    Spouse name: Not on file  . Number of children: 4  . Years of education: Not on file  . Highest education level: Not on file  Occupational History  . Occupation: retired  Tobacco Use  . Smoking status: Never Smoker  . Smokeless tobacco: Never Used  Vaping Use  . Vaping Use: Never used  Substance and Sexual Activity  .  Alcohol use: No  . Drug use: No  . Sexual activity: Not on file  Other Topics Concern  . Not on file  Social History Narrative  . Not on file   Social Determinants of Health   Financial Resource Strain: Not on file  Food Insecurity: Not on file  Transportation Needs: Not on file  Physical Activity: Not on file  Stress: Not on file  Social Connections: Not on file  Intimate Partner Violence: Not on file    Family History  Problem Relation Age of Onset  . Pneumonia Father   . Heart disease Brother        open heart surgery at age 64  . Colon cancer Neg Hx        Objective: Vitals:   07/26/20 1035  BP: (!) 145/70  Pulse: 70  Temp: 97.7 F (36.5 C)     Physical Exam  Lab Results:  No results found for this or any previous visit (from the past 24 hour(s)).  BMET No results for input(s): NA, K, CL, CO2, GLUCOSE, BUN, CREATININE, CALCIUM in the last 72 hours. PSA No results found for: PSA No results found for: TESTOSTERONE  UA has 0-2 RBC's. Results for orders placed or performed in visit on 07/26/20 (from the past 24 hour(s))  Urinalysis, Routine w reflex microscopic     Status: Abnormal   Collection Time: 07/26/20 10:39 AM  Result Value Ref Range   Specific Gravity, UA 1.015 1.005 - 1.030   pH, UA 7.0 5.0 - 7.5   Color, UA Yellow Yellow   Appearance Ur Clear Clear   Leukocytes,UA Negative Negative   Protein,UA Negative Negative/Trace   Glucose, UA Negative Negative   Ketones, UA Negative Negative   RBC, UA Trace (A) Negative   Bilirubin, UA  Negative Negative   Urobilinogen, Ur 0.2 0.2 - 1.0 mg/dL   Nitrite, UA Negative Negative   Microscopic Examination See below:    Narrative   Performed at:  [facility] [address] [place], [place]  [id number] Lab Director: Mina Marble [place], Phone:  [phone]  Microscopic Examination     Status: None   Collection Time: 07/26/20 10:39 AM   Urine  Result Value Ref Range   WBC, UA None seen 0  - 5 /hpf   RBC 0-2 0 - 2 /hpf   Epithelial Cells (non renal) None seen 0 - 10 /hpf   Renal Epithel, UA None seen None seen /hpf   Bacteria, UA None seen None seen/Few   Narrative   Performed at:  [facility] [address], [place], [place]  [id number] Lab Director: [place], Phone:  [phone]    Studies/Results: No results found.  PVR is 140ml  Assessment & Plan: BPH with BOO with increased nocturia and a moderate PVR.   He will continue the finasteride and I will add silodosin 8mg .  He will return in 54mo with a VPR.    Meds ordered this encounter  Medications  . finasteride (PROSCAR) 5 MG tablet    Sig: Take 1 tablet (5 mg total) by mouth daily.    Dispense:  90 tablet    Refill:  3  . silodosin (RAPAFLO) 8 MG CAPS capsule    Sig: Take 1 capsule (8 mg total) by mouth daily with breakfast.    Dispense:  30 capsule    Refill:  11     Orders Placed This Encounter  Procedures  . Urinalysis, Routine w reflex microscopic  . Bladder scan      Return in about 3 months (around 10/25/2020) for PVR.   CC: Patient, No Pcp Per (Inactive)      Irine Seal 07/26/2020 [name], male   DOB: 11-27-1931, 87 y.o.   MRN: [id number]

## 2020-07-27 ENCOUNTER — Ambulatory Visit: Payer: Medicare Other | Admitting: Urology

## 2020-07-30 ENCOUNTER — Telehealth: Payer: Self-pay

## 2020-07-31 NOTE — Telephone Encounter (Signed)
Spoke with patient and he stated that the Silodosin elevated his Bp and it made him feel dizzy. Patient states he would like to try the 4 mg dose but requested that you send in only a few for him to try due to the cost.

## 2020-08-01 ENCOUNTER — Other Ambulatory Visit: Payer: Self-pay

## 2020-08-01 ENCOUNTER — Other Ambulatory Visit: Payer: Self-pay | Admitting: Cardiovascular Disease

## 2020-08-01 DIAGNOSIS — N400 Enlarged prostate without lower urinary tract symptoms: Secondary | ICD-10-CM

## 2020-08-01 MED ORDER — SILODOSIN 4 MG PO CAPS: 4.0000 mg | ORAL_CAPSULE | Freq: Every day | ORAL | 0 refills | Status: DC

## 2020-08-01 NOTE — Telephone Encounter (Signed)
Rx sent. Patient called with no answer. Detailed message left on VM.

## 2020-08-01 NOTE — Telephone Encounter (Signed)
He can have silodosin 4mg  po qday.   We could send in 10 tabs since he only wants a few to start with.

## 2020-08-07 ENCOUNTER — Ambulatory Visit (INDEPENDENT_AMBULATORY_CARE_PROVIDER_SITE_OTHER): Payer: Medicare Other | Admitting: *Deleted

## 2020-08-07 ENCOUNTER — Other Ambulatory Visit: Payer: Self-pay

## 2020-08-07 DIAGNOSIS — Z5181 Encounter for therapeutic drug level monitoring: Secondary | ICD-10-CM

## 2020-08-07 DIAGNOSIS — I4891 Unspecified atrial fibrillation: Secondary | ICD-10-CM | POA: Diagnosis not present

## 2020-08-07 LAB — POCT INR: INR: 3.3 — AB (ref 2.0–3.0)

## 2020-08-07 NOTE — Patient Instructions (Signed)
Take warfarin 1/2 tablet tomorrow then resume 1/2 tablet daily except 1 tablet on Mondays, Wednesdays and Fridays Recheck in 6 weeks

## 2020-08-11 ENCOUNTER — Other Ambulatory Visit: Payer: Self-pay | Admitting: Cardiovascular Disease

## 2020-08-27 ENCOUNTER — Other Ambulatory Visit: Payer: Self-pay | Admitting: Cardiovascular Disease

## 2020-08-27 ENCOUNTER — Other Ambulatory Visit: Payer: Self-pay | Admitting: Student

## 2020-08-28 NOTE — Telephone Encounter (Signed)
This is a [facility] pt.  °

## 2020-09-18 ENCOUNTER — Ambulatory Visit (INDEPENDENT_AMBULATORY_CARE_PROVIDER_SITE_OTHER): Payer: Medicare Other | Admitting: *Deleted

## 2020-09-18 DIAGNOSIS — Z5181 Encounter for therapeutic drug level monitoring: Secondary | ICD-10-CM

## 2020-09-18 DIAGNOSIS — I4891 Unspecified atrial fibrillation: Secondary | ICD-10-CM

## 2020-09-18 LAB — POCT INR: INR: 3.4 — AB (ref 2.0–3.0)

## 2020-09-18 NOTE — Patient Instructions (Addendum)
Hold warfarin tomorrow then decrease dose to 1/2 tablet daily except 1 tablet on Mondays and Fridays Recheck in 6 weeks

## 2020-09-28 ENCOUNTER — Other Ambulatory Visit: Payer: Self-pay | Admitting: Cardiovascular Disease

## 2020-10-12 ENCOUNTER — Other Ambulatory Visit: Payer: Self-pay | Admitting: Cardiovascular Disease

## 2020-10-18 ENCOUNTER — Other Ambulatory Visit: Payer: Self-pay

## 2020-10-18 ENCOUNTER — Encounter: Payer: Self-pay | Admitting: Urology

## 2020-10-18 ENCOUNTER — Ambulatory Visit: Payer: Medicare Other | Admitting: Urology

## 2020-10-18 VITALS — BP 142/62 | HR 76

## 2020-10-18 DIAGNOSIS — N400 Enlarged prostate without lower urinary tract symptoms: Secondary | ICD-10-CM

## 2020-10-18 DIAGNOSIS — R339 Retention of urine, unspecified: Secondary | ICD-10-CM | POA: Diagnosis not present

## 2020-10-18 DIAGNOSIS — R351 Nocturia: Secondary | ICD-10-CM | POA: Diagnosis not present

## 2020-10-18 LAB — BLADDER SCAN AMB NON-IMAGING: Scan Result: 184

## 2020-10-18 NOTE — Progress Notes (Signed)
PVR 155 Subjective:  1. Benign prostatic hyperplasia, unspecified whether lower urinary tract symptoms present      Jerry Cain is a 87 year-old male established patient who is here for follow up regarding further evaluation of BPH and lower urinary tract symptoms.  The patient complains of lower urinary tract symptom(s) that include nocturia and sense of incomplete emptying. The patient states his most bothersome symptom(s) are the following: nocturia. His symptoms have been stable over the last year.   07/26/20: Jerry Cain returns today in f/u. He has a several year history of BPH with BOO with an elevated PSA. He remains on finasteride. He remains off of alpha blockers which caused side effects. He is on prn furosemide and voids well on that. He has variable nocturia 3-5x but feels it is worse than last year. His IPSS is 9. The urgency is only aggravated by the furosemide. His PSA was 1.1 on the finasteride two years ago and we decided further testing was not indicated. He has no hesitancy and a good stream. He feels like he is emptying well most of the time. He has no hematuria or dysuria. He has a history of  a negative biopsy remotely for the elevated PSA. He has no associated signs or symptoms.   10/18/20: Jerry Cain returns for his history of BPH with BOO.  He remains on finasteride.   His PVR is 153ml.  His UA is clear. IPSS is 6.  He has nocturia x 2. He was given silodosin 4mg  at his last visit but he didn't tolerate it and stopped it.    IPSS     Row Name 10/18/20 1600         International Prostate Symptom Score   How often have you had the sensation of not emptying your bladder? Less than half the time     How often have you had to urinate less than every two hours? Less than 1 in 5 times     How often have you found you stopped and started again several times when you urinated? Not at All     How often have you found it difficult to postpone urination? Less than 1  in 5 times     How often have you had a weak urinary stream? Not at All     How often have you had to strain to start urination? Not at All     How many times did you typically get up at night to urinate? 2 Times     Total IPSS Score 6           Quality of Life due to urinary symptoms     If you were to spend the rest of your life with your urinary condition just the way it is now how would you feel about that? Pleased              ROS:  ROS:  A complete review of systems was performed.  All systems are negative except for pertinent findings as noted.   ROS  Allergies  Allergen Reactions   Carbapenems Other (See Comments)    Altered mental status   Cephalosporins     Unknown reaction   Nsaids Other (See Comments)    On blood thinner    Penicillins Swelling and Other (See Comments)    Did it involve swelling of the face/tongue/throat, SOB, or low BP? Unknown-possible swelling Did it involve sudden or severe rash/hives, skin peeling, or any  reaction on the inside of your mouth or nose? Unknown Did you need to seek medical attention at a hospital or doctor's office? Unknown When did it last happen? Over 10 years (possible swelling)      If all above answers are "NO", may proceed with cephalosporin use.     Outpatient Encounter Medications as of 10/18/2020  Medication Sig   albuterol (PROVENTIL) (2.5 MG/3ML) 0.083% nebulizer solution USE 1 VIAL IN NEBULIZER EVERY 6 HOURS AS NEEDED.   Calcium Carbonate-Vitamin D 600-200 MG-UNIT TABS Take 1 tablet by mouth 2 (two) times daily.   carvedilol (COREG) 25 MG tablet TAKE (1) TABLET BY MOUTH TWICE DAILY WITH A MEAL.   Coenzyme Q10 (COQ10 PO) Take 300 mg by mouth daily.   finasteride (PROSCAR) 5 MG tablet Take 1 tablet (5 mg total) by mouth daily.   furosemide (LASIX) 20 MG tablet TAKE (1) TABLET BY MOUTH ONCE DAILY.   Glucosamine 750 MG TABS Take 750 mg by mouth 2 (two) times daily.   isosorbide mononitrate (IMDUR) 60 MG 24 hr  tablet TAKE ONE TABLET ONCE DAILY IN THE MORNING.   levothyroxine (SYNTHROID) 150 MCG tablet TAKE (1) TABLET BY MOUTH ONCE DAILY.   lisinopril (ZESTRIL) 20 MG tablet TAKE (1) TABLET BY MOUTH ONCE DAILY.   Multiple Vitamin (MULTIVITAMIN) capsule Take 1 capsule by mouth every morning.   Nebulizer MISC 1 Units by Does not apply route daily.   nitroGLYCERIN (NITROSTAT) 0.4 MG SL tablet DISSOLVE 1 TABLET UNDER TONGUE EVERY 5 MINUTES UP TO 15 MIN FOR CHEST PAIN. IF NO RELIEF CALL 911.   rosuvastatin (CRESTOR) 10 MG tablet Take 1 tablet (10 mg total) by mouth daily.   traMADol (ULTRAM) 50 MG tablet Take 50 mg by mouth 3 (three) times daily as needed.   warfarin (COUMADIN) 5 MG tablet TAKE (1) TABLET BY MOUTH ONCE DAILY OR AS DIRECTED BY COUMADIN CLINIC.   [DISCONTINUED] silodosin (RAPAFLO) 4 MG CAPS capsule Take 1 capsule (4 mg total) by mouth daily with breakfast.   No facility-administered encounter medications on file as of 10/18/2020.    Past Medical History:  Diagnosis Date   Adenomatous colon polyp 2001   Anxiety    Atrial fibrillation (HCC)    takes Coumadin daily   BPH (benign prostatic hyperplasia)    takes Proscar daily   CAD (coronary artery disease) 2009   a. s/p PTCA and stenting of mid-LAD and LCx in 2009 b. NST in 03/2018 showing prior infarct with no current ischemia   Carpal tunnel syndrome    right   Complication of anesthesia    hard to wake up   Diverticulosis 2007   tcs by Dr. Laural Golden   GERD (gastroesophageal reflux disease)    Gout    Hypercholesterolemia    takes Atorvastatin daily   Hypertension    takes Imdur,Coreg,and Lisinopril daily   Hypothyroidism    takes SYnthroid daily   Joint pain    Joint swelling    Nocturia    Numbness    right hand pointer and middle finger   Osteoarthritis    left knee   Pneumonia    many,many yrs ago   Tubular adenoma 2001    Past Surgical History:  Procedure Laterality Date   Barbie Banner OSTEOTOMY Right 03/17/2013    Procedure: Barbie Banner OSTEOTOMY;  Surgeon: Marcheta Grammes, DPM;  [facility]: [facility];  Service: Orthopedics;  Laterality: Right;   BUNIONECTOMY Right 03/17/2013   Procedure: BUNIONECTOMY, Arthroplasty 2nd toe right  foot;  Surgeon: Marcheta Grammes, DPM;  Location: [facility];  Service: Orthopedics;  Laterality: Right;   CARDIAC CATHETERIZATION  06/11/2007   PTCA X2 in 06/2007:  2 overlapping Cypher stents to LAD (2.5x13 and 3.0x23)   CARDIAC CATHETERIZATION  06/21/2007   Cypher stent 2.5 x 13  to OM branch   CARDIOVERSION  12/12/2011   Procedure: CARDIOVERSION;  Surgeon: Sanda Klein, MD;  Location: [place];  Service: Cardiovascular;  Laterality: N/A;   CATARACT EXTRACTION     bilateral   COLONOSCOPY  2007   Dr. Laural Golden- L sided diverticulosis. Next TCS 2012 due to h/o tubular adenoma.   COLONOSCOPY  02/26/2011   Procedure: COLONOSCOPY;  Surgeon: Daneil Dolin, MD;  [facility];  Service: Endoscopy;  Laterality: N/A;  11:05   CORONARY ANGIOPLASTY     x 3    FLEXOR TENOTOMY  Left 03/17/2013   Procedure: PERCUTANEOUS FLEXOR TENOTOMY 3RD TOE LEFT FOOT;  Surgeon: Marcheta Grammes, DPM;  Location: [facility];  Service: Orthopedics;  Laterality: Left;   HERNIA REPAIR  2009   left inguinal   rot Left    TEE WITHOUT CARDIOVERSION  12/12/2011   Procedure: TRANSESOPHAGEAL ECHOCARDIOGRAM (TEE);  Surgeon: Sanda Klein, MD;  Location: [place];  Service: Cardiovascular;  Laterality: N/A;   successful/sinus rhythm   TOTAL KNEE ARTHROPLASTY Left 06/14/2013   Procedure: TOTAL KNEE ARTHROPLASTY;  Surgeon: Garald Balding, MD;  Location: [place];  Service: Orthopedics;  Laterality: Left;    Social History   Socioeconomic History   Marital status: Married    Spouse name: Not on file   Number of children: 4   Years of education: Not on file   Highest education level: Not on file  Occupational History   Occupation: retired  Tobacco Use   Smoking status: Never   Smokeless  tobacco: Never  Vaping Use   Vaping Use: Never used  Substance and Sexual Activity   Alcohol use: No   Drug use: No   Sexual activity: Not on file  Other Topics Concern   Not on file  Social History Narrative   Not on file   Social Determinants of Health   Financial Resource Strain: Not on file  Food Insecurity: Not on file  Transportation Needs: Not on file  Physical Activity: Not on file  Stress: Not on file  Social Connections: Not on file  Intimate Partner Violence: Not on file    Family History  Problem Relation Age of Onset   Pneumonia Father    Heart disease Brother        open heart surgery at age 33   Colon cancer Neg Hx        Objective: Vitals:   10/18/20 1603  BP: (!) 142/62  Pulse: 76     Physical Exam  Lab Results:  No results found for this or any previous visit (from the past 24 hour(s)).  BMET No results for input(s): NA, K, CL, CO2, GLUCOSE, BUN, CREATININE, CALCIUM in the last 72 hours. PSA No results found for: PSA No results found for: TESTOSTERONE  UA has 0-2 RBC's. No results found for this or any previous visit (from the past 24 hour(s)).   Studies/Results: No results found.  PVR is 158ml  Assessment & Plan: BPH with BOO with increased nocturia and a moderate PVR.   He will continue the finasteride.  He didn't tolerate silodosin.  He will return in 6 months with a PVR.  No orders of the defined types were placed in this encounter.    Orders Placed This Encounter  Procedures   Urinalysis, Routine w reflex microscopic   BLADDER SCAN AMB NON-IMAGING      Return in about 6 months (around 04/20/2021) for PVR.   CC: Patient, No Pcp Per (Inactive)      Irine Seal 10/18/2020 Patient ID: [name], male   DOB: 1932/02/23, 87 y.o.   MRN: [id number]

## 2020-10-18 NOTE — Progress Notes (Signed)
Cardiology Office Note    Date:  10/23/2020   ID:  Jerry Cain, DOB [birth year], MRN [id number]   PCP:  Patient, No Pcp Per (Inactive)   [place]  Cardiologist:  Sanda Klein, MD   Advanced Practice Provider:  No care team member to display Electrophysiologist:  None   [id number]}   Chief Complaint  Patient presents with   Follow-up     History of Present Illness:  Jerry Cain is a 87 y.o. male with CAD DES LAD and Cfx 2009, low risk NST 4782  diastolic heart failure, longstanding persistent atrial fibrillation, on warfarin, history of trifascicular block.   Last saw Dr. Recardo Evangelist 04/04/20 doing well. Sore legs and had to reduce rosuvastatin to 10 mg. CoQ10 added.   Patient comes in alone for f/u. Denies chest pain, dyspnea. Last Thurs was putting up a fence in the heat and overheated. Had to go in and cool down and hydrate. 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 No bleeding problems on coumadin. Didn't bring his med list. Need thyroid checked. Still drives, cares for grandchildren.   Past Medical History:  Diagnosis Date   Adenomatous colon polyp 2001   Anxiety    Atrial fibrillation (HCC)    takes Coumadin daily   BPH (benign prostatic hyperplasia)    takes Proscar daily   CAD (coronary artery disease) 2009   a. s/p PTCA and stenting of mid-LAD and LCx in 2009 b. NST in 03/2018 showing prior infarct with no current ischemia   Carpal tunnel syndrome    right   Complication of anesthesia    hard to wake up   Diverticulosis 2007   tcs by Dr. Laural Golden   GERD (gastroesophageal reflux disease)    Gout    Hypercholesterolemia    takes Atorvastatin daily   Hypertension    takes Imdur,Coreg,and Lisinopril daily   Hypothyroidism    takes SYnthroid daily   Joint pain    Joint swelling    Nocturia    Numbness    right hand pointer and middle finger   Osteoarthritis    left knee   Pneumonia    many,many  yrs ago   Tubular adenoma 2001    Past Surgical History:  Procedure Laterality Date   Barbie Banner OSTEOTOMY Right 03/17/2013   Procedure: Barbie Banner OSTEOTOMY;  Surgeon: Marcheta Grammes, DPM;  [facility];  Service: Orthopedics;  Laterality: Right;   BUNIONECTOMY Right 03/17/2013   Procedure: BUNIONECTOMY, Arthroplasty 2nd toe right foot;  Surgeon: Marcheta Grammes, DPM;  [facility];  Service: Orthopedics;  Laterality: Right;   CARDIAC CATHETERIZATION  06/11/2007   PTCA X2 in 06/2007:  2 overlapping Cypher stents to LAD (2.5x13 and 3.0x23)   CARDIAC CATHETERIZATION  06/21/2007   Cypher stent 2.5 x 13  to OM branch   CARDIOVERSION  12/12/2011   Procedure: CARDIOVERSION;  Surgeon: Sanda Klein, MD;  Location: [facility];  Service: Cardiovascular;  Laterality: N/A;   CATARACT EXTRACTION     bilateral   COLONOSCOPY  2007   Dr. Laural Golden- L sided diverticulosis. Next TCS 2012 due to h/o tubular adenoma.   COLONOSCOPY  02/26/2011   Procedure: COLONOSCOPY;  Surgeon: Daneil Dolin, MD;  [facility];  Service: Endoscopy;  Laterality: N/A;  11:05   CORONARY ANGIOPLASTY     x 3    FLEXOR TENOTOMY  Left 03/17/2013   Procedure: PERCUTANEOUS FLEXOR TENOTOMY 3RD  TOE LEFT FOOT;  Surgeon: Marcheta Grammes, DPM;  [facility];  Service: Orthopedics;  Laterality: Left;   HERNIA REPAIR  2009   left inguinal   rot Left    TEE WITHOUT CARDIOVERSION  12/12/2011   Procedure: TRANSESOPHAGEAL ECHOCARDIOGRAM (TEE);  Surgeon: Sanda Klein, MD;  Location: [facility];  Service: Cardiovascular;  Laterality: N/A;   successful/sinus rhythm   TOTAL KNEE ARTHROPLASTY Left 06/14/2013   Procedure: TOTAL KNEE ARTHROPLASTY;  Surgeon: Garald Balding, MD;  Location: [facility];  Service: Orthopedics;  Laterality: Left;    Current Medications: Current Meds  Medication Sig   albuterol (PROVENTIL) (2.5 MG/3ML) 0.083% nebulizer solution USE 1 VIAL IN NEBULIZER EVERY 6 HOURS AS NEEDED.    Calcium Carbonate-Vitamin D 600-200 MG-UNIT TABS Take 1 tablet by mouth 2 (two) times daily.   carvedilol (COREG) 25 MG tablet TAKE (1) TABLET BY MOUTH TWICE DAILY WITH A MEAL.   Coenzyme Q10 (COQ10 PO) Take 300 mg by mouth daily.   finasteride (PROSCAR) 5 MG tablet Take 1 tablet (5 mg total) by mouth daily.   furosemide (LASIX) 20 MG tablet TAKE (1) TABLET BY MOUTH ONCE DAILY.   Glucosamine 750 MG TABS Take 750 mg by mouth 2 (two) times daily.   isosorbide mononitrate (IMDUR) 60 MG 24 hr tablet TAKE ONE TABLET ONCE DAILY IN THE MORNING.   levothyroxine (SYNTHROID) 150 MCG tablet TAKE (1) TABLET BY MOUTH ONCE DAILY.   lisinopril (ZESTRIL) 20 MG tablet TAKE (1) TABLET BY MOUTH ONCE DAILY.   Multiple Vitamin (MULTIVITAMIN) capsule Take 1 capsule by mouth every morning.   Nebulizer MISC 1 Units by Does not apply route daily.   nitroGLYCERIN (NITROSTAT) 0.4 MG SL tablet DISSOLVE 1 TABLET UNDER TONGUE EVERY 5 MINUTES UP TO 15 MIN FOR CHEST PAIN. IF NO RELIEF CALL 911.   traMADol (ULTRAM) 50 MG tablet Take 50 mg by mouth 3 (three) times daily as needed.   warfarin (COUMADIN) 5 MG tablet TAKE (1) TABLET BY MOUTH ONCE DAILY OR AS DIRECTED BY COUMADIN CLINIC.     Allergies:   Carbapenems, Cephalosporins, Nsaids, and Penicillins   Social History   Socioeconomic History   Marital status: Married    Spouse name: Not on file   Number of children: 4   Years of education: Not on file   Highest education level: Not on file  Occupational History   Occupation: retired  Tobacco Use   Smoking status: Never   Smokeless tobacco: Never  Vaping Use   Vaping Use: Never used  Substance and Sexual Activity   Alcohol use: No   Drug use: No   Sexual activity: Not on file  Other Topics Concern   Not on file  Social History Narrative   Not on file   Social Determinants of Health   Financial Resource Strain: Not on file  Food Insecurity: Not on file  Transportation Needs: Not on file  Physical  Activity: Not on file  Stress: Not on file  Social Connections: Not on file     Family History:  The patient's  family history includes Heart disease in his brother; Pneumonia in his father.   ROS:   Please see the history of present illness.    ROS All other systems reviewed and are negative.   PHYSICAL EXAM:   VS:  BP 140/80   Pulse 86   Ht 5\' 11"  (1.803 m)   Wt 195 lb 12.8 oz (88.8 kg)   SpO2 96%  BMI 27.31 kg/m   Physical Exam  GEN: Well nourished, well developed, in no acute distress  Neck: no JVD, carotid bruits, or masses Cardiac:irreg irreg 2/6 systolic murmur apex Respiratory:  clear to auscultation bilaterally, normal work of breathing GI: soft, nontender, nondistended, + BS Ext: without cyanosis, clubbing, or edema, Good distal pulses bilaterally Neuro:  Alert and Oriented x 3, Psych: euthymic mood, full affect  Wt Readings from Last 3 Encounters:  10/23/20 195 lb 12.8 oz (88.8 kg)  07/26/20 194 lb (88 kg)  05/30/20 200 lb (90.7 kg)      Studies/Labs Reviewed:   EKG:  EKG is not ordered today.    Recent Labs: 06/12/2020: ALT 27; BUN 25; Creatinine, Ser 1.09; Hemoglobin 12.4; Platelets 165; Potassium 3.6; Sodium 140; TSH 12.268   Lipid Panel    Component Value Date/Time   CHOL 95 06/12/2020 1416   CHOL 117 08/27/2017 0820   TRIG 89 06/12/2020 1416   HDL 38 (L) 06/12/2020 1416   HDL 34 (L) 08/27/2017 0820   CHOLHDL 2.5 06/12/2020 1416   VLDL 18 06/12/2020 1416   LDLCALC 39 06/12/2020 1416   LDLCALC 66 08/27/2017 0820    Additional studies/ records that were reviewed today include:  Nuclear stress test 03/25/2018: There was no ST segment deviation noted during stress. Findings consistent with prior inferior/apical myocardial infarction without current ischemia This is an intermediate risk study. Risk based on decreased LVEF, no current myocardium at jeopardy. Correlate LVEF with echo. The left ventricular ejection fraction is mildly decreased  (49%).     ECHO 03/25/2018:   - Left ventricle: The cavity size was normal. Wall thickness was   increased in a pattern of moderate LVH. Systolic function was   normal. The estimated ejection fraction was in the range of 60%   to 65%. Wall motion was normal; there were no regional wall   motion abnormalities. The study is not technically sufficient to   allow evaluation of LV diastolic function. - Aortic valve: Moderately calcified annulus. Trileaflet;   moderately thickened leaflets. Valve area (VTI): 1.79 cm^2. Valve   area (Vmax): 1.79 cm^2. Valve area (Vmean): 1.76 cm^2. - Mitral valve: Mildly calcified annulus. Mildly thickened leaflets   . There was mild regurgitation. - Left atrium: The atrium was moderately dilated. - Right atrium: The atrium was moderately dilated.     Risk Assessment/Calculations:    CHA2DS2-VASc Score = 5  This indicates a 7.2% annual risk of stroke. The patient's score is based upon: CHF History: Yes HTN History: Yes Diabetes History: No Stroke History: No Vascular Disease History: Yes Age Score: 2 Gender Score: 0       ASSESSMENT:    1. Coronary artery disease involving native coronary artery of native heart without angina pectoris   2. Permanent atrial fibrillation ([place])   3. Chronic diastolic heart failure ([place])   4. Essential hypertension   5. Hyperlipidemia, unspecified hyperlipidemia type      PLAN:  In order of problems listed above:  CAD DES LAD & Cfx 2009, low risk NST 2019-no angina, continue coreg/crestor/Imdur/lisinopril. Very active and doing well.  Permanent AFib on Coumadin and carvedilol  Chronic diastolic CHF, normal LVEF echo 2019 compensated on low dose lasix. Weights stable.  HTN BP well controlled. Brought list from home.  HLD on crestor had a light breakfast 6 hrs ago. Will keep that in mind when getting the lipids back  Hypothyroidism-TSH was 12, now taking and says dose increased by [name]  175 mcg but  didn't bring his meds in. recheck today  Shared Decision Making/Informed Consent        Medication Adjustments/Labs and Tests Ordered: Current medicines are reviewed at length with the patient today.  Concerns regarding medicines are outlined above.  Medication changes, Labs and Tests ordered today are listed in the Patient Instructions below. Patient Instructions  Medication Instructions:  Your physician recommends that you continue on your current medications as directed. Please refer to the Current Medication list given to you today.  *If you need a refill on your cardiac medications before your next appointment, please call your pharmacy*   Lab Work: Your physician recommends that you return for lab work today.   If you have labs (blood work) drawn today and your tests are completely normal, you will receive your results only by: [place] (if you have MyChart) OR A paper copy in the mail If you have any lab test that is abnormal or we need to change your treatment, we will call you to review the results.   Testing/Procedures: NONE    Follow-Up: At [facility], you and your health needs are our priority.  As part of our continuing mission to provide you with exceptional heart care, we have created designated Provider Care Teams.  These Care Teams include your primary Cardiologist (physician) and Advanced Practice Providers (APPs -  Physician Assistants and Nurse Practitioners) who all work together to provide you with the care you need, when you need it.  We recommend signing up for the patient portal called "MyChart".  Sign up information is provided on this After Visit Summary.  MyChart is used to connect with patients for Virtual Visits (Telemedicine).  Patients are able to view lab/test results, encounter notes, upcoming appointments, etc.  Non-urgent messages can be sent to your provider as well.   To learn more about what you can do with MyChart, go to  [email or web address].    Your next appointment:   6 month(s)  The format for your next appointment:   In Person  Provider:   Dr. Sallyanne Kuster    Other Instructions Thank you for choosing [facility]!     Sumner Boast, PA-C  10/23/2020 11:31 AM    [facility] [address], [place], [place]  [address] Phone: [phone]; Fax: [phone]

## 2020-10-18 NOTE — Progress Notes (Signed)
post void residual=184  Urological Symptom Review  Patient is experiencing the following symptoms: Frequent urination   Review of Systems  Gastrointestinal (upper)  : Negative for upper GI symptoms  Gastrointestinal (lower) : Negative for lower GI symptoms  Constitutional : Negative for symptoms  Skin: Negative for skin symptoms  Eyes: Negative for eye symptoms  Ear/Nose/Throat : Negative for Ear/Nose/Throat symptoms  Hematologic/Lymphatic: Easy bruising  Cardiovascular : Negative for cardiovascular symptoms  Respiratory : Negative for respiratory symptoms  Endocrine: Negative for endocrine symptoms  Musculoskeletal: Joint pain  Neurological: Negative for neurological symptoms  Psychologic: Negative for psychiatric symptoms

## 2020-10-19 LAB — URINALYSIS, ROUTINE W REFLEX MICROSCOPIC
Bilirubin, UA: NEGATIVE
Glucose, UA: NEGATIVE
Ketones, UA: NEGATIVE
Leukocytes,UA: NEGATIVE
Nitrite, UA: NEGATIVE
Protein,UA: NEGATIVE
Specific Gravity, UA: 1.015 (ref 1.005–1.030)
Urobilinogen, Ur: 0.2 mg/dL (ref 0.2–1.0)
pH, UA: 5.5 (ref 5.0–7.5)

## 2020-10-19 LAB — MICROSCOPIC EXAMINATION
Bacteria, UA: NONE SEEN
Epithelial Cells (non renal): NONE SEEN /hpf (ref 0–10)
RBC, Urine: NONE SEEN /hpf (ref 0–2)
WBC, UA: NONE SEEN /hpf (ref 0–5)

## 2020-10-23 ENCOUNTER — Ambulatory Visit: Payer: Medicare Other | Admitting: Physician Assistant

## 2020-10-23 ENCOUNTER — Encounter: Payer: Self-pay | Admitting: Physician Assistant

## 2020-10-23 ENCOUNTER — Other Ambulatory Visit (HOSPITAL_COMMUNITY)
Admission: RE | Admit: 2020-10-23 | Discharge: 2020-10-23 | Disposition: A | Discharge status: Home / Self Care | Payer: Medicare Other | Source: Ambulatory Visit | Attending: Physician Assistant | Admitting: Physician Assistant

## 2020-10-23 ENCOUNTER — Other Ambulatory Visit: Payer: Self-pay

## 2020-10-23 VITALS — BP 140/80 | HR 86 | Ht 71.0 in | Wt 195.8 lb

## 2020-10-23 DIAGNOSIS — I2510 Atherosclerotic heart disease of native coronary artery without angina pectoris: Secondary | ICD-10-CM

## 2020-10-23 DIAGNOSIS — I4821 Permanent atrial fibrillation: Secondary | ICD-10-CM | POA: Diagnosis not present

## 2020-10-23 DIAGNOSIS — E785 Hyperlipidemia, unspecified: Secondary | ICD-10-CM

## 2020-10-23 DIAGNOSIS — I5032 Chronic diastolic (congestive) heart failure: Secondary | ICD-10-CM

## 2020-10-23 DIAGNOSIS — I10 Essential (primary) hypertension: Secondary | ICD-10-CM | POA: Diagnosis present

## 2020-10-23 LAB — LIPID PANEL
Cholesterol: 93 mg/dL (ref 0–200)
HDL: 37 mg/dL — ABNORMAL LOW (ref >40)
LDL Cholesterol: 41 mg/dL (ref 0–99)
Total CHOL/HDL Ratio: 2.5 RATIO
Triglycerides: 75 mg/dL (ref <150)
VLDL: 15 mg/dL (ref 0–40)

## 2020-10-23 LAB — COMPREHENSIVE METABOLIC PANEL
ALT: 28 U/L (ref 0–44)
AST: 29 U/L (ref 15–41)
Albumin: 4.1 g/dL (ref 3.5–5.0)
Alkaline Phosphatase: 44 U/L (ref 38–126)
Anion gap: 7 (ref 5–15)
BUN: 25 mg/dL — ABNORMAL HIGH (ref 8–23)
CO2: 27 mmol/L (ref 22–32)
Calcium: 9.1 mg/dL (ref 8.9–10.3)
Chloride: 104 mmol/L (ref 98–111)
Creatinine, Ser: 0.99 mg/dL (ref 0.61–1.24)
GFR, Estimated: >60 mL/min (ref >60)
Glucose, Bld: 98 mg/dL (ref 70–99)
Potassium: 3.9 mmol/L (ref 3.5–5.1)
Sodium: 138 mmol/L (ref 135–145)
Total Bilirubin: 1.2 mg/dL (ref 0.3–1.2)
Total Protein: 7 g/dL (ref 6.5–8.1)

## 2020-10-23 LAB — CBC
HCT: 37.9 % — ABNORMAL LOW (ref 39.0–52.0)
Hemoglobin: 12.2 g/dL — ABNORMAL LOW (ref 13.0–17.0)
MCH: 30.8 pg (ref 26.0–34.0)
MCHC: 32.2 g/dL (ref 30.0–36.0)
MCV: 95.7 fL (ref 80.0–100.0)
Platelets: 145 10*3/uL — ABNORMAL LOW (ref 150–400)
RBC: 3.96 MIL/uL — ABNORMAL LOW (ref 4.22–5.81)
RDW: 14.9 % (ref 11.5–15.5)
WBC: 5.4 10*3/uL (ref 4.0–10.5)
nRBC: 0 % (ref 0.0–0.2)

## 2020-10-23 LAB — TSH: TSH: 5.022 u[IU]/mL — ABNORMAL HIGH (ref 0.350–4.500)

## 2020-10-23 NOTE — Patient Instructions (Signed)
Medication Instructions:  Your physician recommends that you continue on your current medications as directed. Please refer to the Current Medication list given to you today.  *If you need a refill on your cardiac medications before your next appointment, please call your pharmacy*   Lab Work: Your physician recommends that you return for lab work today.   If you have labs (blood work) drawn today and your tests are completely normal, you will receive your results only by: [place] (if you have MyChart) OR A paper copy in the mail If you have any lab test that is abnormal or we need to change your treatment, we will call you to review the results.   Testing/Procedures: NONE    Follow-Up: At [facility], you and your health needs are our priority.  As part of our continuing mission to provide you with exceptional heart care, we have created designated Provider Care Teams.  These Care Teams include your primary Cardiologist (physician) and Advanced Practice Providers (APPs -  Physician Assistants and Nurse Practitioners) who all work together to provide you with the care you need, when you need it.  We recommend signing up for the patient portal called "MyChart".  Sign up information is provided on this After Visit Summary.  MyChart is used to connect with patients for Virtual Visits (Telemedicine).  Patients are able to view lab/test results, encounter notes, upcoming appointments, etc.  Non-urgent messages can be sent to your provider as well.   To learn more about what you can do with MyChart, go to [email or web address].    Your next appointment:   6 month(s)  The format for your next appointment:   In Person  Provider:   Dr. Sallyanne Kuster    Other Instructions Thank you for choosing [facility]!

## 2020-10-30 ENCOUNTER — Ambulatory Visit (INDEPENDENT_AMBULATORY_CARE_PROVIDER_SITE_OTHER): Payer: Medicare Other | Admitting: *Deleted

## 2020-10-30 ENCOUNTER — Other Ambulatory Visit: Payer: Self-pay

## 2020-10-30 DIAGNOSIS — I4891 Unspecified atrial fibrillation: Secondary | ICD-10-CM

## 2020-10-30 DIAGNOSIS — Z5181 Encounter for therapeutic drug level monitoring: Secondary | ICD-10-CM | POA: Diagnosis not present

## 2020-10-30 LAB — POCT INR: INR: 2.3 (ref 2.0–3.0)

## 2020-10-30 NOTE — Patient Instructions (Signed)
Continue warfarin 1/2 tablet daily except 1 tablet on Mondays and Fridays  Recheck in 6 weeks.   

## 2020-11-09 ENCOUNTER — Other Ambulatory Visit: Payer: Self-pay | Admitting: Cardiovascular Disease

## 2020-11-24 ENCOUNTER — Other Ambulatory Visit: Payer: Self-pay | Admitting: Student

## 2020-12-01 ENCOUNTER — Other Ambulatory Visit: Payer: Self-pay | Admitting: Student

## 2020-12-11 ENCOUNTER — Ambulatory Visit (INDEPENDENT_AMBULATORY_CARE_PROVIDER_SITE_OTHER): Payer: Medicare Other | Admitting: *Deleted

## 2020-12-11 ENCOUNTER — Telehealth: Payer: Self-pay | Admitting: Student

## 2020-12-11 ENCOUNTER — Other Ambulatory Visit: Payer: Self-pay

## 2020-12-11 DIAGNOSIS — I4891 Unspecified atrial fibrillation: Secondary | ICD-10-CM

## 2020-12-11 DIAGNOSIS — Z5181 Encounter for therapeutic drug level monitoring: Secondary | ICD-10-CM

## 2020-12-11 LAB — POCT INR: INR: 2.3 (ref 2.0–3.0)

## 2020-12-11 MED ORDER — LEVOTHYROXINE SODIUM 175 MCG PO TABS: 175.0000 ug | ORAL_TABLET | Freq: Every day | ORAL | 3 refills | Status: DC

## 2020-12-11 NOTE — Patient Instructions (Signed)
Continue warfarin 1/2 tablet daily except 1 tablet on Mondays and Fridays  Recheck in 6 weeks.   

## 2020-12-11 NOTE — Telephone Encounter (Signed)
Patient states [name] increased his Levothyroxine from 150 to 175.  He needs a new script called in to [place].

## 2020-12-29 ENCOUNTER — Other Ambulatory Visit: Payer: Self-pay | Admitting: Cardiovascular Disease

## 2021-01-02 ENCOUNTER — Telehealth: Payer: Self-pay | Admitting: Student

## 2021-01-02 DIAGNOSIS — E039 Hypothyroidism, unspecified: Secondary | ICD-10-CM

## 2021-01-02 MED ORDER — LEVOTHYROXINE SODIUM 150 MCG PO TABS: 150.0000 ug | ORAL_TABLET | Freq: Every day | ORAL | 3 refills | Status: DC

## 2021-01-02 NOTE — Telephone Encounter (Signed)
New message    Pt c/o medication issue:  1. Name of Medication: levothyroxine (SYNTHROID) 175 MCG tablet  2. How are you currently taking this medication (dosage and times per day)? Stopped taking it   3. Are you having a reaction (difficulty breathing--STAT)? Yes   4. What is your medication issue? Its making his legs swollen  - they swelled up so bad that it looked like the veins were going to pop out   He wants to go back to taking the 191mcg  -  please call in refill to [facility]

## 2021-01-02 NOTE — Telephone Encounter (Signed)
Left a message to return call to office

## 2021-01-02 NOTE — Telephone Encounter (Signed)
Pt stated that since he's been taking 175 mcg levothyroxine, his legs have been swelling and are painful. Pt stated that on Monday and Tuesday he took 150 mcg tablets (what he had left previously) and did not have a problem with his legs. Pt would like to be switched back to 150 mcg. Please advise.

## 2021-01-02 NOTE — Telephone Encounter (Signed)
   That would be a very odd side effect of the medication but if he is tolerating the 150 mcg daily without side effects, then that is fine. Would recheck TSH in 6-8 weeks unless he is being followed by a PCP. He previously did not have a PCP but I would again recommend he find one to help manage his non-cardiac issues.   Signed, Erma Heritage, PA-C 01/02/2021, 11:20 AM Pager: [phone]

## 2021-01-02 NOTE — Telephone Encounter (Signed)
Pt notified. Medication changes reflected in chart/lab order placed for TSH. Recommended to pt that he should find a PCP to help manage non-cardiac issues.

## 2021-01-17 ENCOUNTER — Other Ambulatory Visit: Payer: Self-pay

## 2021-01-17 ENCOUNTER — Encounter (HOSPITAL_COMMUNITY): Payer: Self-pay | Admitting: Emergency Medicine

## 2021-01-17 ENCOUNTER — Emergency Department (HOSPITAL_COMMUNITY)
Admission: EM | Admit: 2021-01-17 | Discharge: 2021-01-17 | Disposition: A | Discharge status: Home / Self Care | Payer: Medicare Other | Attending: Emergency Medicine | Admitting: Emergency Medicine

## 2021-01-17 ENCOUNTER — Emergency Department (HOSPITAL_COMMUNITY): Payer: Medicare Other

## 2021-01-17 DIAGNOSIS — Z20822 Contact with and (suspected) exposure to covid-19: Secondary | ICD-10-CM | POA: Insufficient documentation

## 2021-01-17 DIAGNOSIS — I5023 Acute on chronic systolic (congestive) heart failure: Secondary | ICD-10-CM | POA: Insufficient documentation

## 2021-01-17 DIAGNOSIS — I2510 Atherosclerotic heart disease of native coronary artery without angina pectoris: Secondary | ICD-10-CM | POA: Diagnosis not present

## 2021-01-17 DIAGNOSIS — Z79899 Other long term (current) drug therapy: Secondary | ICD-10-CM | POA: Insufficient documentation

## 2021-01-17 DIAGNOSIS — R0789 Other chest pain: Secondary | ICD-10-CM | POA: Insufficient documentation

## 2021-01-17 DIAGNOSIS — I4891 Unspecified atrial fibrillation: Secondary | ICD-10-CM | POA: Insufficient documentation

## 2021-01-17 DIAGNOSIS — Z7901 Long term (current) use of anticoagulants: Secondary | ICD-10-CM | POA: Diagnosis not present

## 2021-01-17 DIAGNOSIS — I110 Hypertensive heart disease with heart failure: Secondary | ICD-10-CM | POA: Insufficient documentation

## 2021-01-17 DIAGNOSIS — E039 Hypothyroidism, unspecified: Secondary | ICD-10-CM | POA: Diagnosis not present

## 2021-01-17 DIAGNOSIS — Z96652 Presence of left artificial knee joint: Secondary | ICD-10-CM | POA: Diagnosis not present

## 2021-01-17 DIAGNOSIS — R079 Chest pain, unspecified: Secondary | ICD-10-CM | POA: Diagnosis present

## 2021-01-17 DIAGNOSIS — I4510 Unspecified right bundle-branch block: Secondary | ICD-10-CM | POA: Diagnosis not present

## 2021-01-17 LAB — BASIC METABOLIC PANEL
Anion gap: 6 (ref 5–15)
BUN: 21 mg/dL (ref 8–23)
CO2: 26 mmol/L (ref 22–32)
Calcium: 8.6 mg/dL — ABNORMAL LOW (ref 8.9–10.3)
Chloride: 105 mmol/L (ref 98–111)
Creatinine, Ser: 1 mg/dL (ref 0.61–1.24)
GFR, Estimated: >60 mL/min (ref >60)
Glucose, Bld: 131 mg/dL — ABNORMAL HIGH (ref 70–99)
Potassium: 4.2 mmol/L (ref 3.5–5.1)
Sodium: 137 mmol/L (ref 135–145)

## 2021-01-17 LAB — RESP PANEL BY RT-PCR (FLU A&B, COVID) ARPGX2
Influenza A by PCR: NEGATIVE
Influenza B by PCR: NEGATIVE
SARS Coronavirus 2 by RT PCR: NEGATIVE

## 2021-01-17 LAB — TROPONIN I (HIGH SENSITIVITY)
Troponin I (High Sensitivity): 8 ng/L (ref <18)
Troponin I (High Sensitivity): 9 ng/L (ref <18)

## 2021-01-17 LAB — HEPATIC FUNCTION PANEL
ALT: 21 U/L (ref 0–44)
AST: 27 U/L (ref 15–41)
Albumin: 3.7 g/dL (ref 3.5–5.0)
Alkaline Phosphatase: 48 U/L (ref 38–126)
Bilirubin, Direct: 0.3 mg/dL — ABNORMAL HIGH (ref 0.0–0.2)
Indirect Bilirubin: 1 mg/dL — ABNORMAL HIGH (ref 0.3–0.9)
Total Bilirubin: 1.3 mg/dL — ABNORMAL HIGH (ref 0.3–1.2)
Total Protein: 6.5 g/dL (ref 6.5–8.1)

## 2021-01-17 LAB — CBC
HCT: 36.7 % — ABNORMAL LOW (ref 39.0–52.0)
Hemoglobin: 11.9 g/dL — ABNORMAL LOW (ref 13.0–17.0)
MCH: 31.3 pg (ref 26.0–34.0)
MCHC: 32.4 g/dL (ref 30.0–36.0)
MCV: 96.6 fL (ref 80.0–100.0)
Platelets: 153 10*3/uL (ref 150–400)
RBC: 3.8 MIL/uL — ABNORMAL LOW (ref 4.22–5.81)
RDW: 14.9 % (ref 11.5–15.5)
WBC: 5 10*3/uL (ref 4.0–10.5)
nRBC: 0 % (ref 0.0–0.2)

## 2021-01-17 LAB — LIPASE, BLOOD: Lipase: 26 U/L (ref 11–51)

## 2021-01-17 NOTE — ED Provider Notes (Signed)
[facility] EMERGENCY DEPARTMENT Provider Note   CSN: [id number] Arrival date & time: 01/17/21  6195     History Chief Complaint  Patient presents with   Chest Pain    Jerry Cain is a 87 y.o. male.  Pt presents to the ED today with chest pain in the center of his chest.  Pt said it's been going on since Monday, 10/10.  He said it only hurts when he coughs or bends over to tie his shoe.  His cough does not seem to be any worse than normal.  He denies any f/c.  No sob.  No n/v.  No cp now.      Past Medical History:  Diagnosis Date   Adenomatous colon polyp 2001   Anxiety    Atrial fibrillation [facility])    takes Coumadin daily   BPH (benign prostatic hyperplasia)    takes Proscar daily   CAD (coronary artery disease) 2009   a. s/p PTCA and stenting of mid-LAD and LCx in 2009 b. NST in 03/2018 showing prior infarct with no current ischemia   Carpal tunnel syndrome    right   Complication of anesthesia    hard to wake up   Diverticulosis 2007   tcs by Dr. Laural Golden   GERD (gastroesophageal reflux disease)    Gout    Hypercholesterolemia    takes Atorvastatin daily   Hypertension    takes Imdur,Coreg,and Lisinopril daily   Hypothyroidism    takes SYnthroid daily   Joint pain    Joint swelling    Nocturia    Numbness    right hand pointer and middle finger   Osteoarthritis    left knee   Pneumonia    many,many yrs ago   Tubular adenoma 2001    Patient Active Problem List   Diagnosis Date Noted   Bilateral primary osteoarthritis of knee 05/04/2019   CHF (congestive heart failure) ([place]) 05/02/2018   Acute respiratory failure with hypoxia ([place]) 05/01/2018   Reactive airway disease 05/01/2018   AKI (acute kidney injury) ([place]) 05/01/2018   Acute exacerbation of CHF (congestive heart failure) ([place]) 04/30/2018   Acute on chronic diastolic heart failure ([place]) 08/16/2016   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   Arthritis of knee, left 06/16/2013   S/P total knee  replacement using cement 06/14/2013   Osteoarthritis of left knee 03/21/2013   BPH (benign prostatic hyperplasia)    GERD (gastroesophageal reflux disease)    CAD (coronary artery disease)    Fever 03/20/2013   Altered mental state 03/20/2013   Postoperative fever 03/20/2013   PNA (pneumonia) 03/20/2013   Preop cardiovascular exam 11/24/2012   Peroneal tendonitis 10/05/2012   Arthritis of foot, degenerative 10/05/2012   HTN (hypertension) 09/25/2012   Hypercholesterolemia 09/25/2012   Atrial fibrillation ([place]) 06/15/2012   Long term current use of anticoagulant therapy 06/15/2012   Rotator cuff strain 02/04/2012   Shoulder pain 02/04/2012   History of colonic polyps 01/29/2011   High risk medication use 01/29/2011    Past Surgical History:  Procedure Laterality Date   AIKEN OSTEOTOMY Right 03/17/2013   Procedure: Barbie Banner OSTEOTOMY;  Surgeon: Marcheta Grammes, DPM;  Location: [facility];  Service: Orthopedics;  Laterality: Right;   BUNIONECTOMY Right 03/17/2013   Procedure: BUNIONECTOMY, Arthroplasty 2nd toe right foot;  Surgeon: Marcheta Grammes, DPM;  Location: [facility];  Service: Orthopedics;  Laterality: Right;   CARDIAC CATHETERIZATION  06/11/2007   PTCA X2 in 06/2007:  2 overlapping Cypher stents to  LAD (2.5x13 and 3.0x23)   CARDIAC CATHETERIZATION  06/21/2007   Cypher stent 2.5 x 13  to OM branch   CARDIOVERSION  12/12/2011   Procedure: CARDIOVERSION;  Surgeon: Sanda Klein, MD;  Location: [place];  Service: Cardiovascular;  Laterality: N/A;   CATARACT EXTRACTION     bilateral   COLONOSCOPY  2007   Dr. Laural Golden- L sided diverticulosis. Next TCS 2012 due to h/o tubular adenoma.   COLONOSCOPY  02/26/2011   Procedure: COLONOSCOPY;  Surgeon: Daneil Dolin, MD;  Location: [facility];  Service: Endoscopy;  Laterality: N/A;  11:05   CORONARY ANGIOPLASTY     x 3    FLEXOR TENOTOMY  Left 03/17/2013   Procedure: PERCUTANEOUS FLEXOR TENOTOMY 3RD TOE LEFT FOOT;   Surgeon: Marcheta Grammes, DPM;  Location: [facility];  Service: Orthopedics;  Laterality: Left;   HERNIA REPAIR  2009   left inguinal   rot Left    TEE WITHOUT CARDIOVERSION  12/12/2011   Procedure: TRANSESOPHAGEAL ECHOCARDIOGRAM (TEE);  Surgeon: Sanda Klein, MD;  Location: [place];  Service: Cardiovascular;  Laterality: N/A;   successful/sinus rhythm   TOTAL KNEE ARTHROPLASTY Left 06/14/2013   Procedure: TOTAL KNEE ARTHROPLASTY;  Surgeon: Garald Balding, MD;  Location: [place];  Service: Orthopedics;  Laterality: Left;       Family History  Problem Relation Age of Onset   Pneumonia Father    Heart disease Brother        open heart surgery at age 74   Colon cancer Neg Hx     Social History   Tobacco Use   Smoking status: Never   Smokeless tobacco: Never  Vaping Use   Vaping Use: Never used  Substance Use Topics   Alcohol use: No   Drug use: No    Home Medications Prior to Admission medications   Medication Sig Start Date End Date Taking? Authorizing Provider  albuterol (PROVENTIL) (2.5 MG/3ML) 0.083% nebulizer solution USE 1 VIAL IN NEBULIZER EVERY 6 HOURS AS NEEDED. 12/03/20  Yes Strader, [name] M, PA-C  Calcium Carbonate-Vitamin D 600-200 MG-UNIT TABS Take 1 tablet by mouth 2 (two) times daily.   Yes [provider]  carvedilol (COREG) 25 MG tablet TAKE (1) TABLET BY MOUTH TWICE DAILY WITH A MEAL. 08/02/20  Yes Croitoru, Mihai, MD  finasteride (PROSCAR) 5 MG tablet Take 1 tablet (5 mg total) by mouth daily. 07/26/20  Yes Irine Seal, MD  furosemide (LASIX) 20 MG tablet TAKE (1) TABLET BY MOUTH ONCE DAILY. 11/26/20  Yes Croitoru, Mihai, MD  Glucosamine 750 MG TABS Take 750 mg by mouth 2 (two) times daily.   Yes [provider]  isosorbide mononitrate (IMDUR) 60 MG 24 hr tablet TAKE ONE TABLET ONCE DAILY IN THE MORNING. 10/12/20  Yes Croitoru, Mihai, MD  levothyroxine (SYNTHROID) 150 MCG tablet Take 1 tablet (150 mcg total) by mouth daily before  breakfast. 01/02/21  Yes Strader, [name] M, PA-C  lisinopril (ZESTRIL) 20 MG tablet TAKE (1) TABLET BY MOUTH ONCE DAILY. 08/28/20  Yes Croitoru, Mihai, MD  Multiple Vitamin (MULTIVITAMIN) capsule Take 1 capsule by mouth every morning.   Yes [provider]  Nebulizer MISC 1 Units by Does not apply route daily. 05/06/18  Yes Strader, [name] M, PA-C  nitroGLYCERIN (NITROSTAT) 0.4 MG SL tablet DISSOLVE 1 TABLET UNDER TONGUE EVERY 5 MINUTES UP TO 15 MIN FOR CHEST PAIN. IF NO RELIEF CALL 911. 12/05/19  Yes Lorretta Harp, MD  rosuvastatin (CRESTOR) 10 MG  tablet Take 1 tablet (10 mg total) by mouth daily. 04/04/20 01/17/21 Yes Croitoru, Mihai, MD  warfarin (COUMADIN) 5 MG tablet TAKE (1) TABLET BY MOUTH ONCE DAILY OR AS DIRECTED BY COUMADIN CLINIC. 12/31/20  Yes Croitoru, Mihai, MD  traMADol (ULTRAM) 50 MG tablet Take 50 mg by mouth 3 (three) times daily as needed. Patient not taking: Reported on 01/17/2021 04/20/19   [provider]    Allergies    Carbapenems, Cephalosporins, Nsaids, and Penicillins  Review of Systems   Review of Systems  Respiratory:  Positive for cough.   Cardiovascular:  Positive for chest pain.  All other systems reviewed and are negative.  Physical Exam Updated Vital Signs BP 115/77   Pulse 70   Temp 97.7 F (36.5 C) (Oral)   Resp 15   Ht 5\' 11"  (1.803 m)   Wt 85.1 kg   SpO2 100%   BMI 26.18 kg/m   Physical Exam Vitals and nursing note reviewed.  Constitutional:      Appearance: He is well-developed.  HENT:     Head: Normocephalic and atraumatic.  Eyes:     Extraocular Movements: Extraocular movements intact.     Pupils: Pupils are equal, round, and reactive to light.  Cardiovascular:     Rate and Rhythm: Normal rate and regular rhythm.     Heart sounds: Normal heart sounds.  Pulmonary:     Effort: Pulmonary effort is normal.     Breath sounds: Normal breath sounds.  Abdominal:     General: Bowel sounds are normal.     Palpations:  Abdomen is soft.  Musculoskeletal:        General: Normal range of motion.     Cervical back: Normal range of motion and neck supple.  Skin:    General: Skin is warm.     Capillary Refill: Capillary refill takes less than 2 seconds.  Neurological:     General: No focal deficit present.     Mental Status: He is alert and oriented to person, place, and time.  Psychiatric:        Mood and Affect: Mood normal.        Behavior: Behavior normal.    ED Results / Procedures / Treatments   Labs (all labs ordered are listed, but only abnormal results are displayed) Labs Reviewed  BASIC METABOLIC PANEL - Abnormal; Notable for the following components:      Result Value   Glucose, Bld 131 (*)    Calcium 8.6 (*)    All other components within normal limits  CBC - Abnormal; Notable for the following components:   RBC 3.80 (*)    Hemoglobin 11.9 (*)    HCT 36.7 (*)    All other components within normal limits  HEPATIC FUNCTION PANEL - Abnormal; Notable for the following components:   Total Bilirubin 1.3 (*)    Bilirubin, Direct 0.3 (*)    Indirect Bilirubin 1.0 (*)    All other components within normal limits  RESP PANEL BY RT-PCR (FLU A&B, COVID) ARPGX2  LIPASE, BLOOD  TROPONIN I (HIGH SENSITIVITY)  TROPONIN I (HIGH SENSITIVITY)    EKG EKG Interpretation  Date/Time:  Thursday January 17 2021 09:17:03 EDT Ventricular Rate:  67 PR Interval:    QRS Duration: 179 QT Interval:  426 QTC Calculation: 450 R Axis:   -86 Text Interpretation: Atrial fibrillation RBBB and LAFB No significant change since last tracing Confirmed by Isla Pence [phone]) on 01/17/2021 9:19:13 AM  Radiology DG  Chest Port 1 View  Result Date: 01/17/2021 CLINICAL DATA:  Chest pain and cough EXAM: PORTABLE CHEST 1 VIEW COMPARISON:  05/03/2018 FINDINGS: Apical lordotic positioning. Patient rotated minimally right. Moderate cardiomegaly. No pleural effusion or pneumothorax. Interstitial coarsening is slightly  increased, nonspecific in this age group. Somewhat more focal scarring along the left heart border and in the right lung base. No lobar consolidation. No overt congestive failure. IMPRESSION: Cardiomegaly, without congestive failure or acute disease. Electronically Signed   By: Abigail Miyamoto M.D.   On: 01/17/2021 09:40    Procedures Procedures   Medications Ordered in ED Medications - No data to display  ED Course  I have reviewed the triage vital signs and the nursing notes.  Pertinent labs & imaging results that were available during my care of the patient were reviewed by me and considered in my medical decision making (see chart for details).    MDM Rules/Calculators/A&P                           Pt's cardiac work up is negative for anything acute.  Pt's pain sounds very pleuritic.  He is stable for d/c.  Return if worse. F/u with pcp/cards. Final Clinical Impression(s) / ED Diagnoses Final diagnoses:  Chest wall pain    Rx / DC Orders ED Discharge Orders     None        Isla Pence, MD 01/17/21 1204

## 2021-01-17 NOTE — ED Triage Notes (Signed)
Pt complains of chest pain with coughing since last Monday.  Pt states it does not radiate and is only present with coughing.

## 2021-01-22 ENCOUNTER — Ambulatory Visit (INDEPENDENT_AMBULATORY_CARE_PROVIDER_SITE_OTHER): Payer: Medicare Other | Admitting: *Deleted

## 2021-01-22 DIAGNOSIS — Z5181 Encounter for therapeutic drug level monitoring: Secondary | ICD-10-CM | POA: Diagnosis not present

## 2021-01-22 DIAGNOSIS — I4891 Unspecified atrial fibrillation: Secondary | ICD-10-CM

## 2021-01-22 LAB — POCT INR: INR: 2.5 (ref 2.0–3.0)

## 2021-01-22 NOTE — Patient Instructions (Signed)
Continue warfarin 1/2 tablet daily except 1 tablet on Mondays and Fridays  Recheck in 6 weeks.   

## 2021-01-25 ENCOUNTER — Other Ambulatory Visit: Payer: Self-pay | Admitting: Cardiovascular Disease

## 2021-02-04 NOTE — Progress Notes (Signed)
Cardiology Office Note   Date:  02/05/2021   ID:  Jerry Cain, DOB [birth year], MRN [id number]  PCP:  Patient, No Pcp Per (Inactive)  Cardiologist:  Jerry Klein, MD EP: None  Chief Complaint  Patient presents with   Follow-up    Chest pain      History of Present Illness: Jerry Cain is a 87 y.o. 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  He was last evaluated by cardiology at an outpatient visit with Jerry Barrios, PA-C 10/2020 at which time he was doing fairly well from a cardiac standpoint.  No medication changes occurred and he was recommended to follow-up in 6 months. His last ischemic evaluation was an NST in 2019 which showed scar but no evidence of ischemia, EF 49%.  Echo at that time showed EF 60-65%, moderate LVH, no R WMA, indeterminate LV diastolic function, moderate biatrial enlargement, and mild MR.  More recently he was seen in the emergency department 01/23/2021 with complaints of chest pain with coughing.  His EKG was nonischemic atrial fibrillation with CVR, chronic RBBB, chronic LAFB; no ischemic changes. HsTrop was negative x2.  Chest x-ray showed no acute findings.  His symptoms were felt to be pleuritic in nature.  He was discharged home with supportive care and recommended to follow-up with PCP and cardiology.  He presents today for post ED follow-up.  He tells me a couple days prior to his ED visit he had a mechanical fall after tripping over a limb in the yard.  He landed on his right shoulder and afterwards developed anterior chest pain worse with deep breathing.  Given persistent symptoms over a couple days he decided to get it checked out on the ED where he had a reassuring cardiac work-up including EKG and negative trops.  His symptoms were attributed to  MSK etiology and ultimately his symptoms completely resolved within a couple days.  He has not had any recurrent chest pain.  He continues to stay fairly active.  Tells me he still this some chainsaw work to cut up trees on his property.  He denies exertional chest pain, shortness of breath, DOE, dizziness, lightheadedness, syncope, palpitations, lower extremity edema.  He reports weights have been trickling downward over the past few days, down to 189 pounds this morning on his check.  He has not had any orthostatic symptoms.  He will continue to monitor things closely.     Past Medical History:  Diagnosis Date   Adenomatous colon polyp 2001   Anxiety    Atrial fibrillation (HCC)    takes Coumadin daily   BPH (benign prostatic hyperplasia)    takes Proscar daily   CAD (coronary artery disease) 2009   a. s/p PTCA and stenting of mid-LAD and LCx in 2009 b. NST in 03/2018 showing prior infarct with no current ischemia   Carpal tunnel syndrome    right   Complication of anesthesia    hard to wake up   Diverticulosis 2007   tcs by Dr. Laural Cain   GERD (gastroesophageal reflux disease)    Gout    Hypercholesterolemia    takes Atorvastatin daily   Hypertension    takes Imdur,Coreg,and Lisinopril daily   Hypothyroidism    takes SYnthroid daily   Joint pain    Joint swelling  Nocturia    Numbness    right hand pointer and middle finger   Osteoarthritis    left knee   Pneumonia    many,many yrs ago   Tubular adenoma 2001    Past Surgical History:  Procedure Laterality Date   Barbie Banner OSTEOTOMY Right 03/17/2013   Procedure: Barbie Banner OSTEOTOMY;  Surgeon: Jerry Cain, DPM;  Location: [facility];  Service: Orthopedics;  Laterality: Right;   BUNIONECTOMY Right 03/17/2013   Procedure: BUNIONECTOMY, Arthroplasty 2nd toe right foot;  Surgeon: Jerry Cain, DPM;  Location: [facility];  Service: Orthopedics;  Laterality: Right;   CARDIAC CATHETERIZATION  06/11/2007   PTCA X2 in  06/2007:  2 overlapping Cypher stents to LAD (2.5x13 and 3.0x23)   CARDIAC CATHETERIZATION  06/21/2007   Cypher stent 2.5 x 13  to OM branch   CARDIOVERSION  12/12/2011   Procedure: CARDIOVERSION;  Surgeon: Jerry Klein, MD;  Location: [place];  Service: Cardiovascular;  Laterality: N/A;   CATARACT EXTRACTION     bilateral   COLONOSCOPY  2007   Dr. Laural Cain- L sided diverticulosis. Next TCS 2012 due to h/o tubular adenoma.   COLONOSCOPY  02/26/2011   Procedure: COLONOSCOPY;  Surgeon: Jerry Dolin, MD;  [facility];  Service: Endoscopy;  Laterality: N/A;  11:05   CORONARY ANGIOPLASTY     x 3    FLEXOR TENOTOMY  Left 03/17/2013   Procedure: PERCUTANEOUS FLEXOR TENOTOMY 3RD TOE LEFT FOOT;  Surgeon: Jerry Cain, DPM;  Location: [facility];  Service: Orthopedics;  Laterality: Left;   HERNIA REPAIR  2009   left inguinal   rot Left    TEE WITHOUT CARDIOVERSION  12/12/2011   Procedure: TRANSESOPHAGEAL ECHOCARDIOGRAM (TEE);  Surgeon: Jerry Klein, MD;  Location: [place];  Service: Cardiovascular;  Laterality: N/A;   successful/sinus rhythm   TOTAL KNEE ARTHROPLASTY Left 06/14/2013   Procedure: TOTAL KNEE ARTHROPLASTY;  Surgeon: Jerry Balding, MD;  Location: [place];  Service: Orthopedics;  Laterality: Left;     Current Outpatient Medications  Medication Sig Dispense Refill   albuterol (PROVENTIL) (2.5 MG/3ML) 0.083% nebulizer solution USE 1 VIAL IN NEBULIZER EVERY 6 HOURS AS NEEDED. 75 mL 0   Calcium Carbonate-Vitamin D 600-200 MG-UNIT TABS Take 1 tablet by mouth 2 (two) times daily.     carvedilol (COREG) 25 MG tablet TAKE (1) TABLET BY MOUTH TWICE DAILY WITH A MEAL. 180 tablet 2   finasteride (PROSCAR) 5 MG tablet Take 1 tablet (5 mg total) by mouth daily. 90 tablet 3   furosemide (LASIX) 20 MG tablet TAKE (1) TABLET BY MOUTH ONCE DAILY. 90 tablet 0   Glucosamine 750 MG TABS Take 750 mg by mouth 2 (two) times daily.     isosorbide mononitrate (IMDUR) 60 MG 24 hr  tablet TAKE ONE TABLET ONCE DAILY IN THE MORNING. 90 tablet 0   levothyroxine (SYNTHROID) 150 MCG tablet Take 1 tablet (150 mcg total) by mouth daily before breakfast. 90 tablet 3   lisinopril (ZESTRIL) 20 MG tablet TAKE (1) TABLET BY MOUTH ONCE DAILY. 90 tablet 3   Multiple Vitamin (MULTIVITAMIN) capsule Take 1 capsule by mouth every morning.     Nebulizer MISC 1 Units by Does not apply route daily. 1 each 0   nitroGLYCERIN (NITROSTAT) 0.4 MG SL tablet DISSOLVE 1 TABLET UNDER TONGUE EVERY 5 MINUTES UP TO 15 MIN FOR CHEST PAIN. IF NO RELIEF CALL 911. 25 tablet 0   rosuvastatin (CRESTOR) 10 MG tablet TAKE (1)  TABLET BY MOUTH ONCE DAILY. 90 tablet 0   warfarin (COUMADIN) 5 MG tablet TAKE (1) TABLET BY MOUTH ONCE DAILY OR AS DIRECTED BY COUMADIN CLINIC. 30 tablet 0   No current facility-administered medications for this visit.    Allergies:   Carbapenems, Cephalosporins, Nsaids, and Penicillins    Social History:  The patient  reports that he has never smoked. He has never used smokeless tobacco. He reports that he does not drink alcohol and does not use drugs.   Family History:  The patient's family history includes Heart disease in his brother; Pneumonia in his father.    ROS:  Please see the history of present illness.   Otherwise, review of systems are positive for none.   All other systems are reviewed and negative.    PHYSICAL EXAM: VS:  BP 140/80 (BP Location: Right Arm, Patient Position: Sitting, Cuff Size: Normal)   Pulse 73   Ht 6' (1.829 m)   Wt 194 lb (88 kg)   SpO2 98%   BMI 26.31 kg/m  , BMI Body mass index is 26.31 kg/m. GEN: Well nourished, well developed, in no acute distress HEENT: normal Neck: no JVD, carotid bruits, or masses Cardiac: IRIR; no murmurs, rubs, or gallops,no edema  Respiratory:  clear to auscultation bilaterally, normal work of breathing GI: soft, nontender, nondistended, + BS MS: no deformity or atrophy Skin: warm and dry, no rash Neuro:   Strength and sensation are intact Psych: euthymic mood, full affect   EKG:  EKG is ordered today. The ekg ordered today demonstrates atrial fibrillation, rate 73 bpm, chronic RBBB, non-specific T wave abnormalities, no STE/D, no significant change from previous.    Recent Labs: 10/23/2020: TSH 5.022 01/17/2021: ALT 21; BUN 21; Creatinine, Ser 1.00; Hemoglobin 11.9; Platelets 153; Potassium 4.2; Sodium 137    Lipid Panel    Component Value Date/Time   CHOL 93 10/23/2020 1149   CHOL 117 08/27/2017 0820   TRIG 75 10/23/2020 1149   HDL 37 (L) 10/23/2020 1149   HDL 34 (L) 08/27/2017 0820   CHOLHDL 2.5 10/23/2020 1149   VLDL 15 10/23/2020 1149   LDLCALC 41 10/23/2020 1149   LDLCALC 66 08/27/2017 0820      Wt Readings from Last 3 Encounters:  02/05/21 194 lb (88 kg)  01/17/21 187 lb 11.2 oz (85.1 kg)  10/23/20 195 lb 12.8 oz (88.8 kg)      Other studies Reviewed: Additional studies/ records that were reviewed today include:   Nuclear stress test 03/25/2018:  There was no ST segment deviation noted during stress. Findings consistent with prior inferior/apical myocardial infarction without current ischemia This is an intermediate risk study. Risk based on decreased LVEF, no current myocardium at jeopardy. Correlate LVEF with echo. The left ventricular ejection fraction is mildly decreased (49%).     ECHO 03/25/2018:   - Left ventricle: The cavity size was normal. Wall thickness was   increased in a pattern of moderate LVH. Systolic function was   normal. The estimated ejection fraction was in the range of 60%   to 65%. Wall motion was normal; there were no regional wall   motion abnormalities. The study is not technically sufficient to   allow evaluation of LV diastolic function. - Aortic valve: Moderately calcified annulus. Trileaflet;   moderately thickened leaflets. Valve area (VTI): 1.79 cm^2. Valve   area (Vmax): 1.79 cm^2. Valve area (Vmean): 1.76 cm^2. - Mitral  valve: Mildly calcified annulus. Mildly thickened leaflets   .  There was mild regurgitation. - Left atrium: The atrium was moderately dilated. - Right atrium: The atrium was moderately dilated.    ASSESSMENT AND PLAN:  1. CAD s/p PCI/DES to LAD and LCx in 2009: Recent chest pain is clearly MSK in nature.  He has not had any anginal complaints. Low risk Lexiscan Myoview December 2019. Not on aspirin due to need for [facility]. - Continue statin - Continue continue beta-blocker and Imdur   2. Chronic diastolic CHF: He has no volume overload complaints and appears euvolemic on exam.  Per Dr. Loletha Grayer, patient easily maintaining his "dry weight" which we establish to be 190-197 pounds (<190 has orthostatic hypotension, >197 has dyspnea).  Very aware of sodium dietary intake and daily weight monitoring.  NYHA functional class II, limited by musculoskeletal problems rather than heart failure.  Normal LVEF by Dec 2019 echocardiogram.   - Continue Lasix 20 mg daily  3. Permanent AFib: Well rate controlled.  Tolerating anticoagulation without complaints of bleeding.  CHADSVasc score is 5 (age 87, HTN, CAD, CHF). He has never had a stroke or TIA. INR 2.5 01/22/21 - Continue coumadin per coumadin clinic.   4. Trifascicular block: Denies problems with dizziness or syncope.  High risk of progression to complete heart block (When in SR had long PR as well as RBBB and left axis deviation).  Avoid adding any other negative chronotropic agents.  Low threshold to decrease the dose of carvedilol if he develops significant or symptomatic bradycardia. - Continue to monitor with routine EKGs  5. HTN: BP 140/80 today. Home log reviewed with SBP generally in the 120s-140s/70s-80s - Continue carvedilol, lasix, lisinopril, and imdur  6. HLD: LDL41 10/2020 - Continue crestor   Current medicines are reviewed at length with the patient today.  The patient does not have concerns regarding medicines.  The following changes have  been made:  no change  Labs/ tests ordered today include:   Orders Placed This Encounter  Procedures   EKG 12-Lead     Disposition:   FU with Dr. Sallyanne Kuster in 6 months  Signed, Abigail Butts, PA-C  02/05/2021 11:23 AM

## 2021-02-05 ENCOUNTER — Ambulatory Visit: Payer: Medicare Other | Admitting: Medical

## 2021-02-05 ENCOUNTER — Encounter: Payer: Self-pay | Admitting: Medical

## 2021-02-05 ENCOUNTER — Other Ambulatory Visit: Payer: Self-pay

## 2021-02-05 VITALS — BP 140/80 | HR 73 | Ht 72.0 in | Wt 194.0 lb

## 2021-02-05 DIAGNOSIS — I2510 Atherosclerotic heart disease of native coronary artery without angina pectoris: Secondary | ICD-10-CM | POA: Diagnosis not present

## 2021-02-05 DIAGNOSIS — I4821 Permanent atrial fibrillation: Secondary | ICD-10-CM | POA: Diagnosis not present

## 2021-02-05 DIAGNOSIS — I453 Trifascicular block: Secondary | ICD-10-CM | POA: Diagnosis not present

## 2021-02-05 DIAGNOSIS — I5032 Chronic diastolic (congestive) heart failure: Secondary | ICD-10-CM | POA: Diagnosis not present

## 2021-02-05 DIAGNOSIS — I10 Essential (primary) hypertension: Secondary | ICD-10-CM

## 2021-02-05 DIAGNOSIS — E785 Hyperlipidemia, unspecified: Secondary | ICD-10-CM

## 2021-02-05 NOTE — Patient Instructions (Signed)
Medication Instructions:  Your physician recommends that you continue on your current medications as directed. Please refer to the Current Medication list given to you today.  *If you need a refill on your cardiac medications before your next appointment, please call your pharmacy*   Lab Work: None If you have labs (blood work) drawn today and your tests are completely normal, you will receive your results only by: [place] (if you have MyChart) OR A paper copy in the mail If you have any lab test that is abnormal or we need to change your treatment, we will call you to review the results.   Testing/Procedures: None   Follow-Up: At [facility], you and your health needs are our priority.  As part of our continuing mission to provide you with exceptional heart care, we have created designated Provider Care Teams.  These Care Teams include your primary Cardiologist (physician) and Advanced Practice Providers (APPs -  Physician Assistants and Nurse Practitioners) who all work together to provide you with the care you need, when you need it.  We recommend signing up for the patient portal called "MyChart".  Sign up information is provided on this After Visit Summary.  MyChart is used to connect with patients for Virtual Visits (Telemedicine).  Patients are able to view lab/test results, encounter notes, upcoming appointments, etc.  Non-urgent messages can be sent to your provider as well.   To learn more about what you can do with MyChart, go to [email or web address].    Your next appointment:   6 month(s)  The format for your next appointment:   In Person  Provider:   Sanda Klein, MD   Other Instructions

## 2021-02-09 ENCOUNTER — Other Ambulatory Visit: Payer: Self-pay | Admitting: Cardiovascular Disease

## 2021-02-23 ENCOUNTER — Other Ambulatory Visit: Payer: Self-pay | Admitting: Cardiovascular Disease

## 2021-03-05 ENCOUNTER — Ambulatory Visit (INDEPENDENT_AMBULATORY_CARE_PROVIDER_SITE_OTHER): Payer: Medicare Other | Admitting: *Deleted

## 2021-03-05 DIAGNOSIS — I4891 Unspecified atrial fibrillation: Secondary | ICD-10-CM | POA: Diagnosis not present

## 2021-03-05 DIAGNOSIS — Z5181 Encounter for therapeutic drug level monitoring: Secondary | ICD-10-CM | POA: Diagnosis not present

## 2021-03-05 LAB — POCT INR: INR: 2.1 (ref 2.0–3.0)

## 2021-03-05 NOTE — Patient Instructions (Signed)
Continue warfarin 1/2 tablet daily except 1 tablet on Mondays and Fridays  Recheck in 6 weeks.   

## 2021-03-08 ENCOUNTER — Encounter: Payer: Self-pay | Admitting: Cardiovascular Disease

## 2021-03-09 ENCOUNTER — Encounter (HOSPITAL_COMMUNITY): Payer: Self-pay

## 2021-03-09 ENCOUNTER — Other Ambulatory Visit: Payer: Self-pay

## 2021-03-09 ENCOUNTER — Emergency Department (HOSPITAL_COMMUNITY)
Admission: EM | Admit: 2021-03-09 | Discharge: 2021-03-09 | Disposition: A | Discharge status: Home / Self Care | Payer: Medicare Other | Attending: Emergency Medicine | Admitting: Emergency Medicine

## 2021-03-09 DIAGNOSIS — Z96652 Presence of left artificial knee joint: Secondary | ICD-10-CM | POA: Diagnosis not present

## 2021-03-09 DIAGNOSIS — Z7901 Long term (current) use of anticoagulants: Secondary | ICD-10-CM | POA: Insufficient documentation

## 2021-03-09 DIAGNOSIS — I110 Hypertensive heart disease with heart failure: Secondary | ICD-10-CM | POA: Insufficient documentation

## 2021-03-09 DIAGNOSIS — Z79899 Other long term (current) drug therapy: Secondary | ICD-10-CM | POA: Insufficient documentation

## 2021-03-09 DIAGNOSIS — J45909 Unspecified asthma, uncomplicated: Secondary | ICD-10-CM | POA: Insufficient documentation

## 2021-03-09 DIAGNOSIS — I4891 Unspecified atrial fibrillation: Secondary | ICD-10-CM | POA: Diagnosis not present

## 2021-03-09 DIAGNOSIS — R030 Elevated blood-pressure reading, without diagnosis of hypertension: Secondary | ICD-10-CM | POA: Diagnosis present

## 2021-03-09 DIAGNOSIS — E039 Hypothyroidism, unspecified: Secondary | ICD-10-CM | POA: Diagnosis not present

## 2021-03-09 DIAGNOSIS — Z951 Presence of aortocoronary bypass graft: Secondary | ICD-10-CM | POA: Insufficient documentation

## 2021-03-09 DIAGNOSIS — I5033 Acute on chronic diastolic (congestive) heart failure: Secondary | ICD-10-CM | POA: Diagnosis not present

## 2021-03-09 DIAGNOSIS — I2510 Atherosclerotic heart disease of native coronary artery without angina pectoris: Secondary | ICD-10-CM | POA: Diagnosis not present

## 2021-03-09 DIAGNOSIS — I10 Essential (primary) hypertension: Secondary | ICD-10-CM

## 2021-03-09 NOTE — ED Provider Notes (Signed)
[facility] EMERGENCY DEPARTMENT Provider Note   CSN: [id number] Arrival date & time: 03/09/21  1517     History Chief Complaint  Patient presents with   Hypertension    Jerry Cain is a 87 y.o. male.   Hypertension Associated symptoms include headaches. Pertinent negatives include no chest pain, no abdominal pain and no shortness of breath. Patient presents to hypertension.  History of same.  Woke up on Tuesday with today being Saturday states that he checked his blood pressure on Tuesday and it was elevated.  Approximately 170/110.  States that he felt a little off but no chest pain or trouble breathing.  No headache.  No confusion.  States the blood pressure came down.  He has been checking the blood pressure every morning and has been more elevated.  However after he takes medicines it appears to go down.  Has been not going down as much over the last 2 days but still goes down.  No chest pain.  No trouble breathing.  No headaches that are consistent.  States he will sometimes feel little pain come down above his head but is not associated with the high blood pressure.  Sent a message to his cardiologist but had not heard back.      Past Medical History:  Diagnosis Date   Adenomatous colon polyp 2001   Anxiety    Atrial fibrillation [facility])    takes Coumadin daily   BPH (benign prostatic hyperplasia)    takes Proscar daily   CAD (coronary artery disease) 2009   a. s/p PTCA and stenting of mid-LAD and LCx in 2009 b. NST in 03/2018 showing prior infarct with no current ischemia   Carpal tunnel syndrome    right   Complication of anesthesia    hard to wake up   Diverticulosis 2007   tcs by Dr. Laural Golden   GERD (gastroesophageal reflux disease)    Gout    Hypercholesterolemia    takes Atorvastatin daily   Hypertension    takes Imdur,Coreg,and Lisinopril daily   Hypothyroidism    takes SYnthroid daily   Joint pain    Joint swelling    Nocturia    Numbness     right hand pointer and middle finger   Osteoarthritis    left knee   Pneumonia    many,many yrs ago   Tubular adenoma 2001    Patient Active Problem List   Diagnosis Date Noted   Bilateral primary osteoarthritis of knee 05/04/2019   CHF (congestive heart failure) ([place]) 05/02/2018   Acute respiratory failure with hypoxia ([place]) 05/01/2018   Reactive airway disease 05/01/2018   AKI (acute kidney injury) ([place]) 05/01/2018   Acute exacerbation of CHF (congestive heart failure) ([place]) 04/30/2018   Acute on chronic diastolic heart failure ([place]) 08/16/2016   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   Arthritis of knee, left 06/16/2013   S/P total knee replacement using cement 06/14/2013   Osteoarthritis of left knee 03/21/2013   BPH (benign prostatic hyperplasia)    GERD (gastroesophageal reflux disease)    CAD (coronary artery disease)    Fever 03/20/2013   Altered mental state 03/20/2013   Postoperative fever 03/20/2013   PNA (pneumonia) 03/20/2013   Preop cardiovascular exam 11/24/2012   Peroneal tendonitis 10/05/2012   Arthritis of foot, degenerative 10/05/2012   HTN (hypertension) 09/25/2012   Hypercholesterolemia 09/25/2012   Atrial fibrillation ([place]) 06/15/2012   Long term current use of anticoagulant therapy 06/15/2012   Rotator cuff strain 02/04/2012  Shoulder pain 02/04/2012   History of colonic polyps 01/29/2011   High risk medication use 01/29/2011    Past Surgical History:  Procedure Laterality Date   Barbie Banner OSTEOTOMY Right 03/17/2013   Procedure: Barbie Banner OSTEOTOMY;  Surgeon: Marcheta Grammes, DPM;  Location: [facility];  Service: Orthopedics;  Laterality: Right;   BUNIONECTOMY Right 03/17/2013   Procedure: BUNIONECTOMY, Arthroplasty 2nd toe right foot;  Surgeon: Marcheta Grammes, DPM;  Location: [facility];  Service: Orthopedics;  Laterality: Right;   CARDIAC CATHETERIZATION  06/11/2007   PTCA X2 in 06/2007:  2 overlapping Cypher stents to LAD (2.5x13 and 3.0x23)   CARDIAC  CATHETERIZATION  06/21/2007   Cypher stent 2.5 x 13  to OM branch   CARDIOVERSION  12/12/2011   Procedure: CARDIOVERSION;  Surgeon: Sanda Klein, MD;  Location: [place];  Service: Cardiovascular;  Laterality: N/A;   CATARACT EXTRACTION     bilateral   COLONOSCOPY  2007   Dr. Laural Golden- L sided diverticulosis. Next TCS 2012 due to h/o tubular adenoma.   COLONOSCOPY  02/26/2011   Procedure: COLONOSCOPY;  Surgeon: Daneil Dolin, MD;  [facility];  Service: Endoscopy;  Laterality: N/A;  11:05   CORONARY ANGIOPLASTY     x 3    FLEXOR TENOTOMY  Left 03/17/2013   Procedure: PERCUTANEOUS FLEXOR TENOTOMY 3RD TOE LEFT FOOT;  Surgeon: Marcheta Grammes, DPM;  Location[facility];  Service: Orthopedics;  Laterality: Left;   HERNIA REPAIR  2009   left inguinal   rot Left    TEE WITHOUT CARDIOVERSION  12/12/2011   Procedure: TRANSESOPHAGEAL ECHOCARDIOGRAM (TEE);  Surgeon: Sanda Klein, MD;  Location: [place];  Service: Cardiovascular;  Laterality: N/A;   successful/sinus rhythm   TOTAL KNEE ARTHROPLASTY Left 06/14/2013   Procedure: TOTAL KNEE ARTHROPLASTY;  Surgeon: Garald Balding, MD;  Location: [place];  Service: Orthopedics;  Laterality: Left;       Family History  Problem Relation Age of Onset   Pneumonia Father    Heart disease Brother        open heart surgery at age 8   Colon cancer Neg Hx     Social History   Tobacco Use   Smoking status: Never   Smokeless tobacco: Never  Vaping Use   Vaping Use: Never used  Substance Use Topics   Alcohol use: No   Drug use: No    Home Medications Prior to Admission medications   Medication Sig Start Date End Date Taking? Authorizing Provider  albuterol (PROVENTIL) (2.5 MG/3ML) 0.083% nebulizer solution USE 1 VIAL IN NEBULIZER EVERY 6 HOURS AS NEEDED. 12/03/20   Ahmed Prima, Fransisco Hertz, PA-C  Calcium Carbonate-Vitamin D 600-200 MG-UNIT TABS Take 1 tablet by mouth 2 (two) times daily.    [provider]   carvedilol (COREG) 25 MG tablet TAKE (1) TABLET BY MOUTH TWICE DAILY WITH A MEAL. 02/11/21   Croitoru, Mihai, MD  finasteride (PROSCAR) 5 MG tablet Take 1 tablet (5 mg total) by mouth daily. 07/26/20   Irine Seal, MD  furosemide (LASIX) 20 MG tablet TAKE (1) TABLET BY MOUTH ONCE DAILY. 02/25/21   Croitoru, Mihai, MD  Glucosamine 750 MG TABS Take 750 mg by mouth 2 (two) times daily.    [provider]  isosorbide mononitrate (IMDUR) 60 MG 24 hr tablet TAKE ONE TABLET ONCE DAILY IN THE MORNING. 10/12/20   Croitoru, Mihai, MD  levothyroxine (SYNTHROID) 150 MCG tablet Take 1 tablet (150 mcg total) by mouth daily before  breakfast. 01/02/21   Strader, Fransisco Hertz, PA-C  lisinopril (ZESTRIL) 20 MG tablet TAKE (1) TABLET BY MOUTH ONCE DAILY. 08/28/20   Croitoru, Mihai, MD  Multiple Vitamin (MULTIVITAMIN) capsule Take 1 capsule by mouth every morning.    [provider]  Nebulizer MISC 1 Units by Does not apply route daily. 05/06/18   Strader, Fransisco Hertz, PA-C  nitroGLYCERIN (NITROSTAT) 0.4 MG SL tablet DISSOLVE 1 TABLET UNDER TONGUE EVERY 5 MINUTES UP TO 15 MIN FOR CHEST PAIN. IF NO RELIEF CALL 911. 12/05/19   Lorretta Harp, MD  rosuvastatin (CRESTOR) 10 MG tablet TAKE (1) TABLET BY MOUTH ONCE DAILY. 01/25/21   Croitoru, Mihai, MD  warfarin (COUMADIN) 5 MG tablet TAKE (1) TABLET BY MOUTH ONCE DAILY OR AS DIRECTED BY COUMADIN CLINIC. 12/31/20   Croitoru, Mihai, MD    Allergies    Carbapenems, Cephalosporins, Nsaids, and Penicillins  Review of Systems   Review of Systems  Constitutional:  Negative for appetite change.  HENT:  Negative for congestion.   Respiratory:  Negative for shortness of breath.   Cardiovascular:  Negative for chest pain.  Gastrointestinal:  Negative for abdominal pain.  Genitourinary:  Negative for flank pain.  Musculoskeletal:  Negative for back pain.  Neurological:  Positive for headaches.  Psychiatric/Behavioral:  Negative for confusion.    Physical  Exam Updated Vital Signs BP (!) 151/97 (BP Location: Right Arm)   Pulse 76   Temp 97.9 F (36.6 C) (Oral)   Resp 18   Ht 5\' 10"  (1.778 m)   Wt 86.4 kg   SpO2 97%   BMI 27.32 kg/m   Physical Exam Vitals and nursing note reviewed.  HENT:     Head: Atraumatic.  Eyes:     Pupils: Pupils are equal, round, and reactive to light.  Cardiovascular:     Rate and Rhythm: Rhythm irregular.  Pulmonary:     Breath sounds: No wheezing or rhonchi.  Abdominal:     Tenderness: There is no abdominal tenderness.  Musculoskeletal:        General: No tenderness.     Cervical back: Neck supple.  Skin:    General: Skin is warm.     Capillary Refill: Capillary refill takes less than 2 seconds.  Neurological:     Mental Status: He is alert and oriented to person, place, and time.    ED Results / Procedures / Treatments   Labs (all labs ordered are listed, but only abnormal results are displayed) Labs Reviewed - No data to display  EKG EKG Interpretation  Date/Time:  Saturday March 09 2021 10:07:48 EST Ventricular Rate:  72 PR Interval:    QRS Duration: 182 QT Interval:  443 QTC Calculation: 485 R Axis:   -83 Text Interpretation: Atrial fibrillation RBBB and LAFB No significant change since last tracing Confirmed by Davonna Belling [phone]) on 03/09/2021 10:28:13 AM  Radiology No results found.  Procedures Procedures   Medications Ordered in ED Medications - No data to display  ED Course  I have reviewed the triage vital signs and the nursing notes.  Pertinent labs & imaging results that were available during my care of the patient were reviewed by me and considered in my medical decision making (see chart for details).    MDM Rules/Calculators/A&P                           Patient presents with hypertension.  History of same.  Blood  pressures been elevated when he checks it around 5 in the morning.  Does take blood pressure medicines and is come down.  Has been  asymptomatic with it.  Feeling fine.  Reviewing blood pressure numbers it does appear to have an early morning peak that when he rechecks it later in the morning is come down.  Here his blood pressures also come down.  On recheck it was 130/70.  Also asymptomatic.  Doubt severe endorgan damage with these brief hypertension episodes.  Follow-up with cardiology who has been dosing his blood pressure medicines.  Not feel as if he needs further work-up or admission to the hospital at this time.  Instructed to continue taking his blood pressure and can be checked by cardiology.  Discharge home Final Clinical Impression(s) / ED Diagnoses Final diagnoses:  Hypertension, unspecified type    Rx / DC Orders ED Discharge Orders     None        Davonna Belling, MD 03/09/21 1057

## 2021-03-09 NOTE — ED Notes (Signed)
Pt verbalized he took his morning meds around 8 am.

## 2021-03-09 NOTE — Discharge Instructions (Signed)
Discussed with cardiology on Monday about your blood pressure.  As long as you are feeling fine we worry less about the elevated blood pressure

## 2021-03-09 NOTE — ED Triage Notes (Signed)
Reports last week woke up feeling different like his head is not right. Patient has been monitoring BP and it has been elevated in 140'/90's.  Patient complains of headache and dizziness.  Reports some pain in his chest but unsure if it is muscle.

## 2021-03-12 ENCOUNTER — Telehealth: Payer: Self-pay | Admitting: Cardiovascular Disease

## 2021-03-12 NOTE — Telephone Encounter (Signed)
  Patient sent message through MyChart scheduling pool:  My blood pressure has been high in the mornings. I went to ER to get it checked out on Saturday, Dec.3rd. Dr. Alvino Chapel said for me to follow up with you. In checking my medicine, my daughter noticed that I have been taking lisinopril twice a day instead of once a day. The medicine list in My Chart has that I take it once a day. Which is right?  Pt c/o BP issue: STAT if pt c/o blurred vision, one-sided weakness or slurred speech  1. What are your last 5 BP readings?  Before medication-  12/3- 7:45am 104/94 heartrate- 79; 6:30pm  150/90 hr- 68 12/4- 7am 161/91 hr-80; 7pm 142/83 hr- 77 12/5- 5:30am-138/78 hr- 70    2. Are you having any other symptoms (ex. Dizziness, headache, blurred vision, passed out)? No other symptoms  3. What is your BP issue?  My BP ran up all at once without a reason.

## 2021-03-12 NOTE — Telephone Encounter (Signed)
Spoke with pt, he states he was seen on Saturday and there was a miscommunication about his medication. Spoke with pt through [place] earlier today. Pt states everything has been taken care of. I take both of the medications once a day. I told him twice a day but it's really just once a day. Pt thanked me for calling him to check on him.  Matter has been resolved.

## 2021-04-05 ENCOUNTER — Other Ambulatory Visit: Payer: Self-pay | Admitting: Cardiovascular Disease

## 2021-04-12 ENCOUNTER — Other Ambulatory Visit: Payer: Self-pay | Admitting: Cardiovascular Disease

## 2021-04-12 ENCOUNTER — Other Ambulatory Visit: Payer: Self-pay | Admitting: Student

## 2021-04-12 NOTE — Telephone Encounter (Signed)
This is a [place] pt

## 2021-04-16 ENCOUNTER — Ambulatory Visit (INDEPENDENT_AMBULATORY_CARE_PROVIDER_SITE_OTHER): Payer: Medicare Other | Admitting: *Deleted

## 2021-04-16 DIAGNOSIS — Z5181 Encounter for therapeutic drug level monitoring: Secondary | ICD-10-CM

## 2021-04-16 DIAGNOSIS — I4891 Unspecified atrial fibrillation: Secondary | ICD-10-CM | POA: Diagnosis not present

## 2021-04-16 LAB — POCT INR: INR: 2.2 (ref 2.0–3.0)

## 2021-04-16 NOTE — Patient Instructions (Signed)
Continue warfarin 1/2 tablet daily except 1 tablet on Mondays and Fridays  Recheck in 6 weeks.   

## 2021-04-25 ENCOUNTER — Ambulatory Visit (INDEPENDENT_AMBULATORY_CARE_PROVIDER_SITE_OTHER): Payer: Medicare Other | Admitting: Urology

## 2021-04-25 ENCOUNTER — Other Ambulatory Visit: Payer: Self-pay

## 2021-04-25 VITALS — BP 153/79 | HR 76

## 2021-04-25 DIAGNOSIS — R351 Nocturia: Secondary | ICD-10-CM

## 2021-04-25 DIAGNOSIS — R339 Retention of urine, unspecified: Secondary | ICD-10-CM | POA: Diagnosis not present

## 2021-04-25 DIAGNOSIS — R3915 Urgency of urination: Secondary | ICD-10-CM | POA: Diagnosis not present

## 2021-04-25 DIAGNOSIS — N400 Enlarged prostate without lower urinary tract symptoms: Secondary | ICD-10-CM | POA: Diagnosis not present

## 2021-04-25 DIAGNOSIS — N3941 Urge incontinence: Secondary | ICD-10-CM

## 2021-04-25 DIAGNOSIS — N138 Other obstructive and reflux uropathy: Secondary | ICD-10-CM

## 2021-04-25 LAB — URINALYSIS, ROUTINE W REFLEX MICROSCOPIC
Bilirubin, UA: NEGATIVE
Glucose, UA: NEGATIVE
Ketones, UA: NEGATIVE
Leukocytes,UA: NEGATIVE
Nitrite, UA: NEGATIVE
Protein,UA: NEGATIVE
RBC, UA: NEGATIVE
Specific Gravity, UA: 1.01 (ref 1.005–1.030)
Urobilinogen, Ur: 0.2 mg/dL (ref 0.2–1.0)
pH, UA: 7 (ref 5.0–7.5)

## 2021-04-25 LAB — BLADDER SCAN AMB NON-IMAGING: Scan Result: 161

## 2021-04-25 MED ORDER — FINASTERIDE 5 MG PO TABS: 5.0000 mg | ORAL_TABLET | Freq: Every day | ORAL | 3 refills | Status: DC

## 2021-04-25 NOTE — Progress Notes (Signed)
PVR 155 Subjective:  1. Benign prostatic hyperplasia, unspecified whether lower urinary tract symptoms present   2. Incomplete bladder emptying   3. Nocturia   4. Urgency of urination   5. Urge incontinence      Jerry Cain is a 88 year-old male established patient who is here for follow up regarding further evaluation of BPH and lower urinary tract symptoms.  The patient complains of lower urinary tract symptom(s) that include nocturia and sense of incomplete emptying. The patient states his most bothersome symptom(s) are the following: nocturia. His symptoms have been stable over the last year.   07/26/20: Jerry Cain returns today in f/u. He has a several year history of BPH with BOO with an elevated PSA. He remains on finasteride. He remains off of alpha blockers which caused side effects. He is on prn furosemide and voids well on that. He has variable nocturia 3-5x but feels it is worse than last year. His IPSS is 9. The urgency is only aggravated by the furosemide. His PSA was 1.1 on the finasteride two years ago and we decided further testing was not indicated. He has no hesitancy and a good stream. He feels like he is emptying well most of the time. He has no hematuria or dysuria. He has a history of  a negative biopsy remotely for the elevated PSA. He has no associated signs or symptoms.   10/18/20: Jerry Cain returns for his history of BPH with BOO.  He remains on finasteride.   His PVR is 158ml.  His UA is clear. IPSS is 6.  He has nocturia x 2. He was given silodosin 4mg  at his last visit but he didn't tolerate it and stopped it.   04/25/21: Jerry Cain returns today in f/u.  He remains on finasteride.  He a 6 month history of increased urgency and UUI if he eats too much sodium during the day.  His family had COVID in December.  They all recovered but his wife had some hearing issues but that has resolved.  He has no dysuria or hematuria.  His PVR is stable at 123ml and  his IPSS is 22.    IPSS     Row Name 04/25/21 1100         International Prostate Symptom Score   How often have you had the sensation of not emptying your bladder? Almost always     How often have you had to urinate less than every two hours? About half the time     How often have you found you stopped and started again several times when you urinated? Almost always     How often have you found it difficult to postpone urination? About half the time     How often have you had a weak urinary stream? About half the time     How often have you had to strain to start urination? Not at All     How many times did you typically get up at night to urinate? 3 Times     Total IPSS Score 22       Quality of Life due to urinary symptoms   If you were to spend the rest of your life with your urinary condition just the way it is now how would you feel about that? Pleased                ROS:  ROS:  A complete review of systems was performed.  All systems  are negative except for pertinent findings as noted.   ROS  Allergies  Allergen Reactions   Carbapenems Other (See Comments)    Altered mental status   Cephalosporins     Unknown reaction   Nsaids Other (See Comments)    On blood thinner    Penicillins Swelling and Other (See Comments)    Did it involve swelling of the face/tongue/throat, SOB, or low BP? Unknown-possible swelling Did it involve sudden or severe rash/hives, skin peeling, or any reaction on the inside of your mouth or nose? Unknown Did you need to seek medical attention at a hospital or doctor's office? Unknown When did it last happen? Over 10 years (possible swelling)      If all above answers are "NO", may proceed with cephalosporin use.     Outpatient Encounter Medications as of 04/25/2021  Medication Sig   albuterol (PROVENTIL) (2.5 MG/3ML) 0.083% nebulizer solution USE 1 VIAL IN NEBULIZER EVERY 6 HOURS AS NEEDED.   Calcium Carbonate-Vitamin D 600-200  MG-UNIT TABS Take 1 tablet by mouth 2 (two) times daily.   carvedilol (COREG) 25 MG tablet TAKE (1) TABLET BY MOUTH TWICE DAILY WITH A MEAL.   furosemide (LASIX) 20 MG tablet TAKE (1) TABLET BY MOUTH ONCE DAILY.   Glucosamine 750 MG TABS Take 750 mg by mouth 2 (two) times daily.   isosorbide mononitrate (IMDUR) 60 MG 24 hr tablet TAKE ONE TABLET ONCE DAILY IN THE MORNING.   levothyroxine (SYNTHROID) 150 MCG tablet Take 1 tablet (150 mcg total) by mouth daily before breakfast.   lisinopril (ZESTRIL) 20 MG tablet TAKE (1) TABLET BY MOUTH ONCE DAILY.   Multiple Vitamin (MULTIVITAMIN) capsule Take 1 capsule by mouth every morning.   Nebulizer MISC 1 Units by Does not apply route daily.   nitroGLYCERIN (NITROSTAT) 0.4 MG SL tablet DISSOLVE 1 TABLET UNDER TONGUE EVERY 5 MINUTES UP TO 15 MIN FOR CHEST PAIN. IF NO RELIEF CALL 911.   rosuvastatin (CRESTOR) 10 MG tablet TAKE (1) TABLET BY MOUTH ONCE DAILY.   warfarin (COUMADIN) 5 MG tablet TAKE (1) TABLET BY MOUTH ONCE DAILY OR AS DIRECTED BY COUMADIN CLINIC.   [DISCONTINUED] finasteride (PROSCAR) 5 MG tablet Take 1 tablet (5 mg total) by mouth daily.   finasteride (PROSCAR) 5 MG tablet Take 1 tablet (5 mg total) by mouth daily.   No facility-administered encounter medications on file as of 04/25/2021.    Past Medical History:  Diagnosis Date   Adenomatous colon polyp 2001   Anxiety    Atrial fibrillation (HCC)    takes Coumadin daily   BPH (benign prostatic hyperplasia)    takes Proscar daily   CAD (coronary artery disease) 2009   a. s/p PTCA and stenting of mid-LAD and LCx in 2009 b. NST in 03/2018 showing prior infarct with no current ischemia   Carpal tunnel syndrome    right   Complication of anesthesia    hard to wake up   Diverticulosis 2007   tcs by Dr. Laural Golden   GERD (gastroesophageal reflux disease)    Gout    Hypercholesterolemia    takes Atorvastatin daily   Hypertension    takes Imdur,Coreg,and Lisinopril daily    Hypothyroidism    takes SYnthroid daily   Joint pain    Joint swelling    Nocturia    Numbness    right hand pointer and middle finger   Osteoarthritis    left knee   Pneumonia    many,many yrs ago  Tubular adenoma 2001    Past Surgical History:  Procedure Laterality Date   Barbie Banner OSTEOTOMY Right 03/17/2013   Procedure: Barbie Banner OSTEOTOMY;  Surgeon: Marcheta Grammes, DPM;  Location: [facility];  Service: Orthopedics;  Laterality: Right;   BUNIONECTOMY Right 03/17/2013   Procedure: BUNIONECTOMY, Arthroplasty 2nd toe right foot;  Surgeon: Marcheta Grammes, DPM;  Location: [facility];  Service: Orthopedics;  Laterality: Right;   CARDIAC CATHETERIZATION  06/11/2007   PTCA X2 in 06/2007:  2 overlapping Cypher stents to LAD (2.5x13 and 3.0x23)   CARDIAC CATHETERIZATION  06/21/2007   Cypher stent 2.5 x 13  to OM branch   CARDIOVERSION  12/12/2011   Procedure: CARDIOVERSION;  Surgeon: Sanda Klein, MD;  Location: [facility];  Service: Cardiovascular;  Laterality: N/A;   CATARACT EXTRACTION     bilateral   COLONOSCOPY  2007   Dr. Laural Golden- L sided diverticulosis. Next TCS 2012 due to h/o tubular adenoma.   COLONOSCOPY  02/26/2011   Procedure: COLONOSCOPY;  Surgeon: Daneil Dolin, MD;  [facility];  Service: Endoscopy;  Laterality: N/A;  11:05   CORONARY ANGIOPLASTY     x 3    FLEXOR TENOTOMY  Left 03/17/2013   Procedure: PERCUTANEOUS FLEXOR TENOTOMY 3RD TOE LEFT FOOT;  Surgeon: Marcheta Grammes, DPM;  Location: [facility];  Service: Orthopedics;  Laterality: Left;   HERNIA REPAIR  2009   left inguinal   rot Left    TEE WITHOUT CARDIOVERSION  12/12/2011   Procedure: TRANSESOPHAGEAL ECHOCARDIOGRAM (TEE);  Surgeon: Sanda Klein, MD;  Location: [facility];  Service: Cardiovascular;  Laterality: N/A;   successful/sinus rhythm   TOTAL KNEE ARTHROPLASTY Left 06/14/2013   Procedure: TOTAL KNEE ARTHROPLASTY;  Surgeon: Garald Balding, MD;  Location: [facility];  Service:  Orthopedics;  Laterality: Left;    Social History   Socioeconomic History   Marital status: Married    Spouse name: Not on file   Number of children: 4   Years of education: Not on file   Highest education level: Not on file  Occupational History   Occupation: retired  Tobacco Use   Smoking status: Never   Smokeless tobacco: Never  Vaping Use   Vaping Use: Never used  Substance and Sexual Activity   Alcohol use: No   Drug use: No   Sexual activity: Not on file  Other Topics Concern   Not on file  Social History Narrative   Not on file   Social Determinants of Health   Financial Resource Strain: Not on file  Food Insecurity: Not on file  Transportation Needs: Not on file  Physical Activity: Not on file  Stress: Not on file  Social Connections: Not on file  Intimate Partner Violence: Not on file    Family History  Problem Relation Age of Onset   Pneumonia Father    Heart disease Brother        open heart surgery at age 86   Colon cancer Neg Hx        Objective: Vitals:   04/25/21 1058  BP: (!) 153/79  Pulse: 76     Physical Exam  Lab Results:  Results for orders placed or performed in visit on 04/25/21 (from the past 24 hour(s))  Urinalysis, Routine w reflex microscopic     Status: None   Collection Time: 04/25/21 11:18 AM  Result Value Ref Range   Specific Gravity, UA 1.010 1.005 - 1.030   pH, UA 7.0 5.0 - 7.5  Color, UA Yellow Yellow   Appearance Ur Clear Clear   Leukocytes,UA Negative Negative   Protein,UA Negative Negative/Trace   Glucose, UA Negative Negative   Ketones, UA Negative Negative   RBC, UA Negative Negative   Bilirubin, UA Negative Negative   Urobilinogen, Ur 0.2 0.2 - 1.0 mg/dL   Nitrite, UA Negative Negative   Microscopic Examination Comment    Narrative   Performed at:  [facility] [address], [place], [place]  [id number] Lab Director: [place], Phone:  [phone]    BMET No results for  input(s): NA, K, CL, CO2, GLUCOSE, BUN, CREATININE, CALCIUM in the last 72 hours. PSA No results found for: PSA No results found for: TESTOSTERONE  UA is clear.  Results for orders placed or performed in visit on 04/25/21 (from the past 24 hour(s))  Urinalysis, Routine w reflex microscopic     Status: None   Collection Time: 04/25/21 11:18 AM  Result Value Ref Range   Specific Gravity, UA 1.010 1.005 - 1.030   pH, UA 7.0 5.0 - 7.5   Color, UA Yellow Yellow   Appearance Ur Clear Clear   Leukocytes,UA Negative Negative   Protein,UA Negative Negative/Trace   Glucose, UA Negative Negative   Ketones, UA Negative Negative   RBC, UA Negative Negative   Bilirubin, UA Negative Negative   Urobilinogen, Ur 0.2 0.2 - 1.0 mg/dL   Nitrite, UA Negative Negative   Microscopic Examination Comment    Narrative   Performed at:  [facility] [address] [place], [place]  [id number] Lab Director: [place], Phone:  [phone]     Studies/Results: No results found.  PVR is 110ml  Assessment & Plan: BPH with BOO with increased urgency and UUI with nocturia and a moderate PVR.   He will continue the finasteride.  He didn't tolerate silodosin.  He is not interested in other medications.  I discussed behavioral modification. He will return in 6 months with a PVR.     Meds ordered this encounter  Medications   finasteride (PROSCAR) 5 MG tablet    Sig: Take 1 tablet (5 mg total) by mouth daily.    Dispense:  90 tablet    Refill:  3      Orders Placed This Encounter  Procedures   Urinalysis, Routine w reflex microscopic   BLADDER SCAN AMB NON-IMAGING      Return in about 6 months (around 10/23/2021) for with PVR.   CC: Patient, No Pcp Per (Inactive)      Jerry Cain 04/25/2021 [name], male   DOB: 05/15/1931, 88 y.o.   MRN: [id number] [name], male   DOB: [birth year], 88 y.o.   MRN: [id number]

## 2021-04-25 NOTE — Progress Notes (Signed)
post void residual= 161 Urological Symptom Review  Patient is experiencing the following symptoms: Frequent urination Hard to postpone urination Get up at night to urinate Leakage of urine Stream starts and stops   Review of Systems  Gastrointestinal (upper)  : Negative for upper GI symptoms  Gastrointestinal (lower) : Negative for lower GI symptoms  Constitutional : Negative for symptoms  Skin: Negative for skin symptoms  Eyes: Negative for eye symptoms  Ear/Nose/Throat : Negative for Ear/Nose/Throat symptoms  Hematologic/Lymphatic: Easy bruising  Cardiovascular : Negative for cardiovascular symptoms  Respiratory : Negative for respiratory symptoms  Endocrine: Negative for endocrine symptoms  Musculoskeletal: Negative for musculoskeletal symptoms  Neurological: Dizziness  Psychologic: Negative for psychiatric symptoms

## 2021-05-17 ENCOUNTER — Other Ambulatory Visit: Payer: Self-pay | Admitting: Urology

## 2021-05-17 DIAGNOSIS — N400 Enlarged prostate without lower urinary tract symptoms: Secondary | ICD-10-CM

## 2021-05-24 ENCOUNTER — Other Ambulatory Visit: Payer: Self-pay | Admitting: Cardiovascular Disease

## 2021-05-28 ENCOUNTER — Ambulatory Visit (INDEPENDENT_AMBULATORY_CARE_PROVIDER_SITE_OTHER): Payer: Medicare Other | Admitting: *Deleted

## 2021-05-28 DIAGNOSIS — Z5181 Encounter for therapeutic drug level monitoring: Secondary | ICD-10-CM

## 2021-05-28 DIAGNOSIS — I4891 Unspecified atrial fibrillation: Secondary | ICD-10-CM | POA: Diagnosis not present

## 2021-05-28 LAB — POCT INR: INR: 2.5 (ref 2.0–3.0)

## 2021-05-28 NOTE — Patient Instructions (Signed)
Continue warfarin 1/2 tablet daily except 1 tablet on Mondays and Fridays  Recheck in 6 weeks.   

## 2021-06-10 ENCOUNTER — Other Ambulatory Visit: Payer: Self-pay | Admitting: Cardiovascular Disease

## 2021-07-05 ENCOUNTER — Other Ambulatory Visit: Payer: Self-pay | Admitting: Cardiovascular Disease

## 2021-07-09 ENCOUNTER — Ambulatory Visit (INDEPENDENT_AMBULATORY_CARE_PROVIDER_SITE_OTHER): Payer: Medicare Other | Admitting: *Deleted

## 2021-07-09 DIAGNOSIS — Z5181 Encounter for therapeutic drug level monitoring: Secondary | ICD-10-CM | POA: Diagnosis not present

## 2021-07-09 DIAGNOSIS — I4891 Unspecified atrial fibrillation: Secondary | ICD-10-CM | POA: Diagnosis not present

## 2021-07-09 LAB — POCT INR: INR: 2.1 (ref 2.0–3.0)

## 2021-07-09 NOTE — Patient Instructions (Signed)
Continue warfarin 1/2 tablet daily except 1 tablet on Mondays and Fridays  Recheck in 6 weeks.   

## 2021-07-11 ENCOUNTER — Ambulatory Visit: Admitting: Urology

## 2021-07-13 ENCOUNTER — Other Ambulatory Visit: Payer: Self-pay | Admitting: Cardiovascular Disease

## 2021-08-03 ENCOUNTER — Other Ambulatory Visit: Payer: Self-pay | Admitting: Cardiovascular Disease

## 2021-08-08 ENCOUNTER — Ambulatory Visit: Payer: Medicare Other | Admitting: Cardiovascular Disease

## 2021-08-08 ENCOUNTER — Encounter: Payer: Self-pay | Admitting: Cardiovascular Disease

## 2021-08-08 VITALS — BP 130/70 | HR 66 | Ht 70.0 in | Wt 193.0 lb

## 2021-08-08 DIAGNOSIS — I2510 Atherosclerotic heart disease of native coronary artery without angina pectoris: Secondary | ICD-10-CM

## 2021-08-08 DIAGNOSIS — I453 Trifascicular block: Secondary | ICD-10-CM | POA: Diagnosis not present

## 2021-08-08 DIAGNOSIS — I5032 Chronic diastolic (congestive) heart failure: Secondary | ICD-10-CM | POA: Diagnosis not present

## 2021-08-08 DIAGNOSIS — I4811 Longstanding persistent atrial fibrillation: Secondary | ICD-10-CM | POA: Diagnosis not present

## 2021-08-08 DIAGNOSIS — E7800 Pure hypercholesterolemia, unspecified: Secondary | ICD-10-CM

## 2021-08-08 DIAGNOSIS — D6869 Other thrombophilia: Secondary | ICD-10-CM

## 2021-08-08 DIAGNOSIS — I10 Essential (primary) hypertension: Secondary | ICD-10-CM

## 2021-08-08 MED ORDER — NITROGLYCERIN 0.4 MG SL SUBL: SUBLINGUAL_TABLET | SUBLINGUAL | 0 refills | Status: DC

## 2021-08-08 NOTE — Patient Instructions (Signed)

## 2021-08-08 NOTE — Progress Notes (Signed)
[name], male   DOB: Sep 14, 1931, 88 y.o.   MRN: [id number] ?[name], male   DOB: [birth year], 88 y.o.   MRN: [id number] ?  ? ?Cardiology Office Note   ? ?Date:  08/11/2021  ? ?ID:  Jerry Cain, DOB 07-Apr-1932, MRN [id number] ? ?PCP:  Patient, No Pcp Per (Inactive)  ?Cardiologist:   Sanda Klein, MD  ? ?Chief Complaint  ?Patient presents with  ? Atrial Fibrillation  ? Congestive Heart Failure  ? ? ?History of Present Illness:  ?Jerry Cain is a 88 y.o. male with CAD, diastolic heart failure, longstanding persistent atrial fibrillation, on warfarin anticoagulation, history of trifascicular block. ? ?He continues to do quite well.  He keeps a close eye on his vital signs and weights which has been very stable around 186-189 pounds (our office scale shows 4-5 pounds more) and has not required any diuretic dose adjustments.  His heart rate is consistently in the 60s and 70s and his blood pressure is always normal.  He is conscientious about sodium restriction. ? ?He denies problems with exertional dyspnea or angina and does not have orthopnea, PND or leg edema.  He has not had dizziness palpitations or syncope.  He denies falls, injuries or bleeding problems and is compliant with warfarin anticoagulation with INR almost always in for therapeutic range. ? ?He did not tolerate doses higher than in 10 mg daily of rosuvastatin.  On this medication, his most recent LDL cholesterol was excellent at 41. ? ? ? ?In 2009, he received drug eluting Cypher stents to the LAD and the circumflex coronary arteries. Comorbidity includes systemic hypertension and hyperlipidemia both of which are adequately treated. By echocardiography was recently in December 2019, he has preserved left ventricular systolic function and no valvular abnormalities. He has not had catheterization since 2009.  His nuclear stress test in December 2019 showed scar without ischemia and was a low risk  study, EF 49%. ? ? ?Past Medical History:  ?Diagnosis Date  ? Adenomatous colon polyp 2001  ? Anxiety   ? Atrial fibrillation ([facility])   ? takes Coumadin daily  ? BPH (benign prostatic hyperplasia)   ? takes Proscar daily  ? CAD (coronary artery disease) 2009  ? a. s/p PTCA and stenting of mid-LAD and LCx in 2009 b. NST in 03/2018 showing prior infarct with no current ischemia  ? Carpal tunnel syndrome   ? right  ? Complication of anesthesia   ? hard to wake up  ? Diverticulosis 2007  ? tcs by Dr. Laural Golden  ? GERD (gastroesophageal reflux disease)   ? Gout   ? Hypercholesterolemia   ? takes Atorvastatin daily  ? Hypertension   ? takes Imdur,Coreg,and Lisinopril daily  ? Hypothyroidism   ? takes SYnthroid daily  ? Joint pain   ? Joint swelling   ? Nocturia   ? Numbness   ? right hand pointer and middle finger  ? Osteoarthritis   ? left knee  ? Pneumonia   ? many,many yrs ago  ? Tubular adenoma 2001  ? ? ?Past Surgical History:  ?Procedure Laterality Date  ? AIKEN OSTEOTOMY Right 03/17/2013  ? Procedure: [facility] [employer];  Surgeon: Marcheta Grammes, DPM;  Location: [facility];  Service: Orthopedics;  Laterality: Right;  ? BUNIONECTOMY Right 03/17/2013  ? Procedure: BUNIONECTOMY, Arthroplasty 2nd toe right foot;  Surgeon: Marcheta Grammes, DPM;  Location: [facility];  Service: Orthopedics;  Laterality: Right;  ? CARDIAC CATHETERIZATION  06/11/2007  ? PTCA X2 in 06/2007:  2 overlapping Cypher stents to LAD (2.5x13 and 3.0x23)  ? CARDIAC CATHETERIZATION  06/21/2007  ? Cypher stent 2.5 x 13  to OM branch  ? CARDIOVERSION  12/12/2011  ? Procedure: CARDIOVERSION;  Surgeon: Sanda Klein, MD;  [facility];  Service: Cardiovascular;  Laterality: N/A;  ? CATARACT EXTRACTION    ? bilateral  ? COLONOSCOPY  2007  ? Dr. Laural Golden- L sided diverticulosis. Next TCS 2012 due to h/o tubular adenoma.  ? COLONOSCOPY  02/26/2011  ? Procedure: COLONOSCOPY;  Surgeon: Daneil Dolin, MD;  [facility];  Service: Endoscopy;   Laterality: N/A;  11:05  ? CORONARY ANGIOPLASTY    ? x 3   ? FLEXOR TENOTOMY  Left 03/17/2013  ? Procedure: PERCUTANEOUS FLEXOR TENOTOMY 3RD TOE LEFT FOOT;  Surgeon: Marcheta Grammes, DPM;  Location: [facility];  Service: Orthopedics;  Laterality: Left;  ? HERNIA REPAIR  2009  ? left inguinal  ? rot Left   ? TEE WITHOUT CARDIOVERSION  12/12/2011  ? Procedure: TRANSESOPHAGEAL ECHOCARDIOGRAM (TEE);  Surgeon: Sanda Klein, MD;  [facility];  Service: Cardiovascular;  Laterality: N/A;   successful/sinus rhythm  ? TOTAL KNEE ARTHROPLASTY Left 06/14/2013  ? Procedure: TOTAL KNEE ARTHROPLASTY;  Surgeon: Garald Balding, MD;  Location: [facility];  Service: Orthopedics;  Laterality: Left;  ? ? ?Outpatient Medications Prior to Visit  ?Medication Sig Dispense Refill  ? albuterol (PROVENTIL) (2.5 MG/3ML) 0.083% nebulizer solution USE 1 VIAL IN NEBULIZER EVERY 6 HOURS AS NEEDED. 75 mL 3  ? Calcium Carbonate-Vitamin D 600-200 MG-UNIT TABS Take 1 tablet by mouth 2 (two) times daily.    ? finasteride (PROSCAR) 5 MG tablet TAKE 1 TABLET DAILY. 90 tablet 0  ? furosemide (LASIX) 20 MG tablet TAKE (1) TABLET BY MOUTH ONCE DAILY. 90 tablet 3  ? Glucosamine 750 MG TABS Take 750 mg by mouth 2 (two) times daily.    ? isosorbide mononitrate (IMDUR) 60 MG 24 hr tablet TAKE ONE TABLET ONCE DAILY IN THE MORNING. 90 tablet 1  ? levothyroxine (SYNTHROID) 150 MCG tablet Take 1 tablet (150 mcg total) by mouth daily before breakfast. 90 tablet 3  ? lisinopril (ZESTRIL) 20 MG tablet TAKE (1) TABLET BY MOUTH ONCE DAILY. 90 tablet 0  ? Multiple Vitamin (MULTIVITAMIN) capsule Take 1 capsule by mouth every morning.    ? Nebulizer MISC 1 Units by Does not apply route daily. 1 each 0  ? rosuvastatin (CRESTOR) 10 MG tablet TAKE (1) TABLET BY MOUTH ONCE DAILY. 90 tablet 1  ? warfarin (COUMADIN) 5 MG tablet Take 1 tablet (5 mg total) by mouth daily at 4 PM. TAKE 1-2 TABLETS BY MOUTH ONCE DAILY OR AS DIRECTED BY COUMADIN CLINIC. 30 tablet 0  ?  carvedilol (COREG) 25 MG tablet TAKE (1) TABLET BY MOUTH TWICE DAILY WITH A MEAL. 180 tablet 0  ? nitroGLYCERIN (NITROSTAT) 0.4 MG SL tablet DISSOLVE 1 TABLET UNDER TONGUE EVERY 5 MINUTES UP TO 15 MIN FOR CHEST PAIN. IF NO RELIEF CALL 911. 25 tablet 0  ? ?No facility-administered medications prior to visit.  ?  ? ?Allergies:   Carbapenems, Cephalosporins, Nsaids, and Penicillins  ? ?Social History  ? ?Socioeconomic History  ? Marital status: Married  ?  Spouse name: Not on file  ? Number of children: 4  ? Years of education: Not on file  ? Highest education level: Not on file  ?Occupational History  ? Occupation:  retired  ?Tobacco Use  ? Smoking status: Never  ? Smokeless tobacco: Never  ?Vaping Use  ? Vaping Use: Never used  ?Substance and Sexual Activity  ? Alcohol use: No  ? Drug use: No  ? Sexual activity: Not on file  ?Other Topics Concern  ? Not on file  ?Social History Narrative  ? Not on file  ? ?Social Determinants of Health  ? ?Financial Resource Strain: Not on file  ?Food Insecurity: Not on file  ?Transportation Needs: Not on file  ?Physical Activity: Not on file  ?Stress: Not on file  ?Social Connections: Not on file  ?  ? ?Family History:  The patient's family history includes Heart disease in his brother; Pneumonia in his father.  ? ?ROS:   ?Please see the history of present illness.    ?All other systems are reviewed and are negative.  ? ?PHYSICAL EXAM:   ?VS:  BP 130/70   Pulse 66   Ht '5\' 10"'$  (1.778 m)   Wt 193 lb (87.5 kg)   SpO2 95%   BMI 27.69 kg/m?    ? ? ? ?General: Alert, oriented x3, no distress, appears well. ?Head: no evidence of trauma, PERRL, EOMI, no exophtalmos or lid lag, no myxedema, no xanthelasma; normal ears, nose and oropharynx ?Neck: normal jugular venous pulsations and no hepatojugular reflux; brisk carotid pulses without delay and no carotid bruits ?Chest: clear to auscultation, no signs of consolidation by percussion or palpation, normal fremitus, symmetrical and full  respiratory excursions ?Cardiovascular: normal position and quality of the apical impulse, irregular rhythm, normal first and second heart sounds, no murmurs, rubs or gallops ?Abdomen: no tenderness or distention, no mas

## 2021-08-09 ENCOUNTER — Other Ambulatory Visit: Payer: Self-pay | Admitting: Cardiovascular Disease

## 2021-08-11 ENCOUNTER — Encounter: Payer: Self-pay | Admitting: Cardiovascular Disease

## 2021-08-20 ENCOUNTER — Ambulatory Visit (INDEPENDENT_AMBULATORY_CARE_PROVIDER_SITE_OTHER): Payer: Medicare Other | Admitting: *Deleted

## 2021-08-20 DIAGNOSIS — Z5181 Encounter for therapeutic drug level monitoring: Secondary | ICD-10-CM | POA: Diagnosis not present

## 2021-08-20 DIAGNOSIS — I4891 Unspecified atrial fibrillation: Secondary | ICD-10-CM

## 2021-08-20 LAB — POCT INR: INR: 2.1 (ref 2.0–3.0)

## 2021-08-20 NOTE — Patient Instructions (Signed)
Continue warfarin 1/2 tablet daily except 1 tablet on Mondays and Fridays  Recheck in 6 weeks.   

## 2021-08-23 ENCOUNTER — Other Ambulatory Visit: Payer: Self-pay | Admitting: Cardiovascular Disease

## 2021-09-04 ENCOUNTER — Encounter: Payer: Self-pay | Admitting: Orthopaedic Surgery

## 2021-09-04 ENCOUNTER — Ambulatory Visit: Payer: Self-pay

## 2021-09-04 ENCOUNTER — Ambulatory Visit (INDEPENDENT_AMBULATORY_CARE_PROVIDER_SITE_OTHER): Payer: Medicare Other | Admitting: Orthopaedic Surgery

## 2021-09-04 DIAGNOSIS — Z96653 Presence of artificial knee joint, bilateral: Secondary | ICD-10-CM | POA: Diagnosis not present

## 2021-09-04 DIAGNOSIS — M25562 Pain in left knee: Secondary | ICD-10-CM

## 2021-09-04 DIAGNOSIS — M170 Bilateral primary osteoarthritis of knee: Secondary | ICD-10-CM | POA: Diagnosis not present

## 2021-09-04 MED ORDER — BUPIVACAINE HCL 0.25 % IJ SOLN
2.0000 mL | INTRAMUSCULAR | Status: AC | PRN
  Administered 2021-09-04: 2 mL via INTRA_ARTICULAR

## 2021-09-04 NOTE — Progress Notes (Signed)
Office Visit Note   Patient: Jerry Cain           Date of Birth: January 26, 1932           MRN: [id number] Visit Date: 09/04/2021              Requested by: No referring provider defined for this encounter. PCP: Patient, No Pcp Per (Inactive)   Assessment & Plan: Visit Diagnoses:  1. Bilateral primary osteoarthritis of knee   2. Status post total bilateral knee replacement using cement     Plan: Bail relates a several week history of left knee pain.  He had a prior left total knee replacement probably 7 or 8 years ago and has been doing well until recently.  He notes that he had some pain when he was getting up and down from his tractor.  His knee has not been red warm or swollen.  Denies any fever or chills.  He has not used any ambulatory aid.  He localizes the pain along the lateral parapatellar region.  X-rays were nondiagnostic.  He has excellent glue mantle in position of the components.  There is some calcification within the distal quads just proximal to the attachment to the superior pole of the patella but excellent quad strength and no defect.  I think the problem is soft tissue in nature and will inject the area of tenderness along the lateral parapatellar region with betamethasone and Marcaine.  After the injection he had no pain.  We will continue to monitor his course.  I do not see any problems with the total knee components  Follow-Up Instructions: Return if symptoms worsen or fail to improve.   Orders:  Orders Placed This Encounter  Procedures   XR KNEE 3 VIEW LEFT   XR KNEE 3 VIEW RIGHT   No orders of the defined types were placed in this encounter.     Procedures: Large Joint Inj: L knee on 09/04/2021 11:39 AM Indications: pain and diagnostic evaluation Details: 25 G 1.5 in needle, anteromedial approach  Arthrogram: No  Medications: 2 mL bupivacaine 0.25 %  12 mg betamethasone injected with Marcaine into the lateral aspect of the left  knee Procedure, treatment alternatives, risks and benefits explained, specific risks discussed. Consent was given by the patient. Patient was prepped and draped in the usual sterile fashion.      Clinical Data: No additional findings.   Subjective: Chief Complaint  Patient presents with   Right Knee - Pain   Left Knee - Pain    History of TKA 06/2013  Patient presents today for bilateral knee pain. He said that the left hurts him more than the right knee. His left knee started to hurt about 3 weeks ago after climing on his tractor to plant produce. He has pain with flexion. Ibuprofen helps. He has a history of left total knee arthroplasty done in March of 2015. His right knee continues to bother him medially. He was here last year and had his right knee aspirated and injected. His pain is located medially.   HPI  Review of Systems   Objective: Vital Signs: There were no vitals taken for this visit.  Physical Exam Constitutional:      Appearance: He is well-developed.  Pulmonary:     Effort: Pulmonary effort is normal.  Skin:    General: Skin is warm and dry.  Neurological:     Mental Status: He is alert and oriented to person, place, and  time.  Psychiatric:        Behavior: Behavior normal.    Ortho Exam awake and oriented x3.  Comfortable sitting.  No acute distress.  Left knee was not hot red warm or swollen.  No effusion.  He had full quick extension and was able to flex over 100 degrees.  He did have an area of tenderness along the lateral parapatellar region.  The patella was stable.  No pain along the distal quadriceps or superior patella.  No pain along the medial lateral compartments or along the patella tendon.  He was able to flex at least 100 degrees without instability  Specialty Comments:  No specialty comments available.  Imaging: No results found.   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   Arthritis of knee, left 06/16/2013   S/P total knee replacement using cement 06/14/2013   Osteoarthritis of left knee 03/21/2013   BPH (benign prostatic hyperplasia)    GERD (gastroesophageal reflux disease)    CAD (coronary artery disease)    Fever 03/20/2013   Altered mental state 03/20/2013   Postoperative fever 03/20/2013   PNA (pneumonia) 03/20/2013   Preop cardiovascular exam 11/24/2012   Peroneal tendonitis 10/05/2012   Arthritis of foot, degenerative 10/05/2012   HTN (hypertension) 09/25/2012   Hypercholesterolemia 09/25/2012   Atrial fibrillation ([place]) 06/15/2012   Long term current use of anticoagulant therapy 06/15/2012   Rotator cuff strain 02/04/2012   Shoulder pain 02/04/2012   History of colonic polyps 01/29/2011   High risk medication use 01/29/2011   Past Medical History:  Diagnosis Date   Adenomatous colon polyp 2001   Anxiety    Atrial fibrillation (HCC)    takes Coumadin daily   BPH (benign prostatic hyperplasia)    takes Proscar daily   CAD (coronary artery disease) 2009   a. s/p PTCA and stenting of mid-LAD and LCx in 2009 b. NST in 03/2018 showing prior infarct with no current ischemia   Carpal tunnel syndrome    right   Complication of anesthesia    hard to wake up   Diverticulosis 2007   tcs by Dr. Laural Golden   GERD (gastroesophageal reflux disease)    Gout    Hypercholesterolemia    takes Atorvastatin daily   Hypertension    takes Imdur,Coreg,and Lisinopril daily   Hypothyroidism    takes SYnthroid daily   Joint pain    Joint swelling    Nocturia    Numbness    right hand  pointer and middle finger   Osteoarthritis    left knee   Pneumonia    many,many yrs ago   Tubular adenoma 2001    Family History  Problem Relation Age of Onset   Pneumonia Father    Heart disease Brother        open heart surgery at age 64   Colon cancer Neg Hx     Past Surgical History:  Procedure Laterality Date   Barbie Banner OSTEOTOMY Right 03/17/2013   Procedure: Barbie Banner OSTEOTOMY;  Surgeon: Marcheta Grammes, DPM;  Location: [facility];  Service: Orthopedics;  Laterality: Right;   BUNIONECTOMY Right 03/17/2013   Procedure: BUNIONECTOMY, Arthroplasty 2nd toe right foot;  Surgeon: Marcheta Grammes, DPM;  Location: [facility];  Service: Orthopedics;  Laterality: Right;   CARDIAC CATHETERIZATION  06/11/2007   PTCA X2 in 06/2007:  2 overlapping Cypher stents to LAD (2.5x13 and 3.0x23)   CARDIAC CATHETERIZATION  06/21/2007   Cypher stent 2.5 x 13  to OM branch   CARDIOVERSION  12/12/2011   Procedure: CARDIOVERSION;  Surgeon: Sanda Klein, MD;  Location: [place];  Service: Cardiovascular;  Laterality: N/A;   CATARACT EXTRACTION     bilateral   COLONOSCOPY  2007   Dr. Laural Golden- L sided diverticulosis. Next TCS 2012 due to h/o tubular adenoma.   COLONOSCOPY  02/26/2011   Procedure: COLONOSCOPY;  Surgeon: Daneil Dolin, MD;  [facility];  Service: Endoscopy;  Laterality: N/A;  11:05   CORONARY ANGIOPLASTY     x 3    FLEXOR TENOTOMY  Left 03/17/2013   Procedure: PERCUTANEOUS FLEXOR TENOTOMY 3RD TOE LEFT FOOT;  Surgeon: Marcheta Grammes, DPM;  Location: [facility];  Service: Orthopedics;  Laterality: Left;   HERNIA REPAIR  2009   left inguinal   rot Left    TEE WITHOUT CARDIOVERSION  12/12/2011   Procedure: TRANSESOPHAGEAL ECHOCARDIOGRAM (TEE);  Surgeon: Sanda Klein, MD;  Location: [place];  Service: Cardiovascular;  Laterality: N/A;   successful/sinus rhythm   TOTAL KNEE ARTHROPLASTY Left 06/14/2013   Procedure: TOTAL KNEE ARTHROPLASTY;  Surgeon: Garald Balding, MD;  Location: [place];  Service: Orthopedics;  Laterality: Left;   Social History   Occupational History   Occupation: retired  Tobacco Use   Smoking status: Never   Smokeless tobacco: Never  Vaping Use   Vaping Use: Never used  Substance and Sexual Activity   Alcohol use: No   Drug use: No   Sexual activity: Not on file

## 2021-09-05 ENCOUNTER — Encounter: Payer: Self-pay | Admitting: Cardiovascular Disease

## 2021-09-23 ENCOUNTER — Ambulatory Visit: Payer: Medicare Other | Admitting: Physician Assistant

## 2021-09-23 ENCOUNTER — Ambulatory Visit (INDEPENDENT_AMBULATORY_CARE_PROVIDER_SITE_OTHER): Payer: Medicare Other

## 2021-09-23 ENCOUNTER — Encounter: Payer: Self-pay | Admitting: Physician Assistant

## 2021-09-23 DIAGNOSIS — M25531 Pain in right wrist: Secondary | ICD-10-CM

## 2021-09-23 DIAGNOSIS — M79641 Pain in right hand: Secondary | ICD-10-CM

## 2021-09-23 NOTE — Progress Notes (Signed)
Office Visit Note   Patient: Jerry Cain           Date of Birth: 05/05/1931           MRN: [id number] Visit Date: 09/23/2021              Requested by: No referring provider defined for this encounter. PCP: Patient, No Pcp Per  Chief Complaint  Patient presents with   Right Hand - Follow-up      HPI: Patient is a pleasant 88 year old gentleman who is a longtime patient of Dr. Durward Fortes.  He presents with his family today with a chief complaint of right wrist pain and swelling.  He said on Friday night he picked up a 25 pound bag of corn with his right hand which is his dominant hand.  Did not feel any popping or pulling but shortly after that began having pain and swelling across the dorsum of his hand which extended into his wrist.  He is on chronic anticoagulation for atrial fibrillation.  He also has a history of GI bleeds.  Assessment & Plan: Visit Diagnoses:  1. Pain in right hand   2. Pain in right wrist     Plan: X-rays show advanced arthritis of the right wrist.  Hand x-rays some ectopic bone cannot rule out CPPD.  He does not have any acute fractures.  Unfortunately he cannot take steroids as he has had a GI bleed before and he is very concerned about this.  That being on 6 Coumadin therapy.  We will place him in a wrist splint today and a light compressive dressing.  His family understands if he starts to have any sensation changes in his fingers to take the wrist splint off.  It is on very loosely.  We also recommended elevating it and icing it over the top of the splint.  He will follow-up with Dr. Durward Fortes next week notably after the splint was applied he did get some slight relief and felt his fingers were moving a little bit better  Follow-Up Instructions: No follow-ups on file.   Ortho Exam  Patient is alert, oriented, no adenopathy, well-dressed, normal affect, normal respiratory effort. Examination of his right wrist he has no redness or cellulitis.  He  has a easily palpable radial pulse.  He has full sensation in all of his fingers.  He is mostly limited by the swelling that extends from the dorsal part of his hand up into his wrist.  He can oppose his thumb to his lesser fingers but cannot quite touch secondary to the swelling in his hand.  Imaging: No results found. No images are attached to the encounter.  Labs: Lab Results  Component Value Date   HGBA1C  06/19/2007    5.7 (NOTE)   The ADA recommends the following therapeutic goals for glycemic   control related to Hgb A1C measurement:   Goal of Therapy:   < 7.0% Hgb A1C   Action Suggested:  > 8.0% Hgb A1C   Ref:  Diabetes Care, 22, Suppl. 1, 1999   HGBA1C  06/12/2007    5.9 (NOTE)   The ADA recommends the following therapeutic goals for glycemic   control related to Hgb A1C measurement:   Goal of Therapy:   < 7.0% Hgb A1C   Action Suggested:  > 8.0% Hgb A1C   Ref:  Diabetes Care, 22, Suppl. 1, 1999   LABURIC 7.3 03/20/2013   LABURIC 7.0 06/22/2007   REPTSTATUS 06/09/2013  FINAL 06/08/2013   CULT NO GROWTH Performed at [facility] 06/08/2013     Lab Results  Component Value Date   ALBUMIN 3.7 01/17/2021   ALBUMIN 4.1 10/23/2020   ALBUMIN 4.0 06/12/2020    Lab Results  Component Value Date   MG 1.9 06/12/2016   MG 2.4 06/19/2007   No results found for: "VD25OH"  No results found for: "PREALBUMIN"    Latest Ref Rng & Units 01/17/2021    9:30 AM 10/23/2020   11:48 AM 06/12/2020    2:16 PM  CBC EXTENDED  WBC 4.0 - 10.5 K/uL 5.0  5.4  5.9   RBC 4.22 - 5.81 MIL/uL 3.80  3.96  4.09   Hemoglobin 13.0 - 17.0 g/dL 11.9  12.2  12.4   HCT 39.0 - 52.0 % 36.7  37.9  38.8   Platelets 150 - 400 K/uL 153  145  165      There is no height or weight on file to calculate BMI.  Orders:  Orders Placed This Encounter  Procedures   XR Hand Complete Right   XR Wrist Complete Right   No orders of the defined types were placed in this encounter.    Procedures: No  procedures performed  Clinical Data: No additional findings.  ROS:  All other systems negative, except as noted in the HPI. Review of Systems  Objective: Vital Signs: There were no vitals taken for this visit.  Specialty Comments:  No specialty comments available.  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   Arthritis of knee, left 06/16/2013   S/P total knee replacement using cement 06/14/2013   Osteoarthritis of left knee 03/21/2013   BPH (benign prostatic hyperplasia)    GERD (gastroesophageal reflux disease)    CAD (coronary artery disease)    Fever 03/20/2013   Altered mental state 03/20/2013   Postoperative fever 03/20/2013   PNA (pneumonia) 03/20/2013   Preop cardiovascular exam 11/24/2012   Peroneal tendonitis 10/05/2012   Arthritis of foot, degenerative 10/05/2012   HTN (hypertension) 09/25/2012   Hypercholesterolemia 09/25/2012   Atrial fibrillation ([place]) 06/15/2012   Long term current use of anticoagulant therapy 06/15/2012   Rotator cuff strain 02/04/2012   Shoulder pain 02/04/2012   History of colonic polyps 01/29/2011   High risk medication use 01/29/2011   Past Medical History:  Diagnosis Date   Adenomatous colon polyp 2001   Anxiety    Atrial fibrillation (HCC)    takes Coumadin daily   BPH (benign prostatic hyperplasia)    takes Proscar daily   CAD (coronary artery disease) 2009   a. s/p PTCA and stenting of mid-LAD and LCx in 2009 b. NST in 03/2018 showing prior infarct with no  current ischemia   Carpal tunnel syndrome    right   Complication of anesthesia    hard to wake up   Diverticulosis 2007   tcs by Dr. Laural Golden   GERD (gastroesophageal reflux disease)    Gout    Hypercholesterolemia    takes Atorvastatin daily   Hypertension  takes Imdur,Coreg,and Lisinopril daily   Hypothyroidism    takes SYnthroid daily   Joint pain    Joint swelling    Nocturia    Numbness    right hand pointer and middle finger   Osteoarthritis    left knee   Pneumonia    many,many yrs ago   Tubular adenoma 2001    Family History  Problem Relation Age of Onset   Pneumonia Father    Heart disease Brother        open heart surgery at age 58   Colon cancer Neg Hx     Past Surgical History:  Procedure Laterality Date   Barbie Banner OSTEOTOMY Right 03/17/2013   Procedure: Barbie Banner OSTEOTOMY;  Surgeon: Marcheta Grammes, DPM;  [facility];  Service: Orthopedics;  Laterality: Right;   BUNIONECTOMY Right 03/17/2013   Procedure: BUNIONECTOMY, Arthroplasty 2nd toe right foot;  Surgeon: Marcheta Grammes, DPM;  Location: [facility];  Service: Orthopedics;  Laterality: Right;   CARDIAC CATHETERIZATION  06/11/2007   PTCA X2 in 06/2007:  2 overlapping Cypher stents to LAD (2.5x13 and 3.0x23)   CARDIAC CATHETERIZATION  06/21/2007   Cypher stent 2.5 x 13  to OM branch   CARDIOVERSION  12/12/2011   Procedure: CARDIOVERSION;  Surgeon: Sanda Klein, MD;  Location: [place];  Service: Cardiovascular;  Laterality: N/A;   CATARACT EXTRACTION     bilateral   COLONOSCOPY  2007   Dr. Laural Golden- L sided diverticulosis. Next TCS 2012 due to h/o tubular adenoma.   COLONOSCOPY  02/26/2011   Procedure: COLONOSCOPY;  Surgeon: Daneil Dolin, MD;  [facility];  Service: Endoscopy;  Laterality: N/A;  11:05   CORONARY ANGIOPLASTY     x 3    FLEXOR TENOTOMY  Left 03/17/2013   Procedure: PERCUTANEOUS FLEXOR TENOTOMY 3RD TOE LEFT FOOT;  Surgeon: Marcheta Grammes, DPM;   Location: [facility];  Service: Orthopedics;  Laterality: Left;   HERNIA REPAIR  2009   left inguinal   rot Left    TEE WITHOUT CARDIOVERSION  12/12/2011   Procedure: TRANSESOPHAGEAL ECHOCARDIOGRAM (TEE);  Surgeon: Sanda Klein, MD;  Location: [place];  Service: Cardiovascular;  Laterality: N/A;   successful/sinus rhythm   TOTAL KNEE ARTHROPLASTY Left 06/14/2013   Procedure: TOTAL KNEE ARTHROPLASTY;  Surgeon: Garald Balding, MD;  Location: [place];  Service: Orthopedics;  Laterality: Left;   Social History   Occupational History   Occupation: retired  Tobacco Use   Smoking status: Never   Smokeless tobacco: Never  Vaping Use   Vaping Use: Never used  Substance and Sexual Activity   Alcohol use: No   Drug use: No   Sexual activity: Not on file

## 2021-09-27 ENCOUNTER — Other Ambulatory Visit: Payer: Self-pay | Admitting: Physician Assistant

## 2021-09-27 ENCOUNTER — Telehealth: Payer: Self-pay

## 2021-09-27 MED ORDER — TRAMADOL HCL 50 MG PO TABS: 50.0000 mg | ORAL_TABLET | Freq: Four times a day (QID) | ORAL | 0 refills | Status: DC | PRN

## 2021-09-27 NOTE — Telephone Encounter (Signed)
LMOM for patient letting him know we called in medication for his hand/wrist

## 2021-10-01 ENCOUNTER — Ambulatory Visit: Payer: Medicare Other | Admitting: *Deleted

## 2021-10-01 DIAGNOSIS — I4891 Unspecified atrial fibrillation: Secondary | ICD-10-CM | POA: Diagnosis not present

## 2021-10-01 DIAGNOSIS — Z5181 Encounter for therapeutic drug level monitoring: Secondary | ICD-10-CM | POA: Diagnosis not present

## 2021-10-01 LAB — POCT INR: INR: 1.8 — AB (ref 2.0–3.0)

## 2021-10-02 ENCOUNTER — Encounter: Payer: Self-pay | Admitting: Orthopaedic Surgery

## 2021-10-02 ENCOUNTER — Ambulatory Visit (INDEPENDENT_AMBULATORY_CARE_PROVIDER_SITE_OTHER): Payer: Medicare Other | Admitting: Orthopaedic Surgery

## 2021-10-02 DIAGNOSIS — M25531 Pain in right wrist: Secondary | ICD-10-CM

## 2021-10-02 MED ORDER — BUPIVACAINE HCL 0.25 % IJ SOLN
2.0000 mL | INTRAMUSCULAR | Status: AC | PRN
  Administered 2021-10-02: 2 mL

## 2021-10-02 NOTE — Progress Notes (Signed)
Office Visit Note   Patient: Jerry Cain           Date of Birth: [birth year]           MRN: [id number] Visit Date: 10/02/2021              Requested by: No referring provider defined for this encounter. PCP: Patient, No Pcp Per   Assessment & Plan: Visit Diagnoses:  1. Pain in right wrist     Plan: Kunzler was seen just over a week ago in the [place] office for evaluation of right hand and wrist pain.  He had been doing some work around the home and within 24 hours hours developed significant pain and swelling of the wrist.  X-rays demonstrate diffuse degenerative changes in the carpus and at the radiocarpal joint with some ectopic calcification possibly consistent with CPPD.  He was placed in a splint placed on some pain medicine.  He does have a history of GI bleed and we are hesitant to place him on NSAIDs or prednisone.  He notes that the swelling is significantly decreased but he still having some pain.  There is some tenderness at the base of the thumb where he has significant arthritis and on the dorsum of the wrist where he has bone-on-bone the radiocarpal joint.  I have injected both of those areas with betamethasone and placed him back in the splint.  I like him to return in 2 weeks for reevaluation.  Neurologically intact  Follow-Up Instructions: Return in about 2 weeks (around 10/16/2021).   Orders:  No orders of the defined types were placed in this encounter.  No orders of the defined types were placed in this encounter.     Procedures: Hand/UE Inj: R thumb CMC for osteoarthritis on 10/02/2021 12:49 PM Details: dorsal approach Medications: 2 mL bupivacaine 0.25 %  12 mg betamethasone injected with Marcaine into the base of the thumb and the dorsum of the wrist without problem      Clinical Data: No additional findings.   Subjective: Chief Complaint  Patient presents with   Right Hand - Pain    Can't tell if the splint is helping. Swelling has  gone down. Hasn't slowed him down much. Can't take Tramadol  Swelling is significantly decreased and pain is better in his right hand and wrist.  Has been wearing the splint.  No numbness or tingling.  HPI  Review of Systems   Objective: Vital Signs: There were no vitals taken for this visit.  Physical Exam Constitutional:      Appearance: He is well-developed.  Pulmonary:     Effort: Pulmonary effort is normal.  Skin:    General: Skin is warm and dry.  Neurological:     Mental Status: He is alert and oriented to person, place, and time.  Psychiatric:        Behavior: Behavior normal.     Ortho Exam right hand with minimal swelling dorsally.  There was an area of about 2 or 3 cm in the mid carpus with some fluctuation and might be consistent with either some synovitis or extensor tenosynovitis.  He had pain along the dorsum of the wrist mild to moderate diffusely and even at the base of the thumb with there was positive fine test.  He was able to make a fist slowly and had good capillary refill and sensation to the fingers.  Very limited range of motion of his wrist with no evidence of cellulitis  or infection  Specialty Comments:  No specialty comments available.  Imaging: No results found.   PMFS History: Patient Active Problem List   Diagnosis Date Noted   Pain in right wrist 09/23/2021   Bilateral primary osteoarthritis of knee 05/04/2019   CHF (congestive heart failure) ([place]) 05/02/2018   Acute respiratory failure with hypoxia ([place]) 05/01/2018   Reactive airway disease 05/01/2018   AKI (acute kidney injury) ([place]) 05/01/2018   Acute exacerbation of CHF (congestive heart failure) ([place]) 04/30/2018   Acute on chronic diastolic heart failure ([place]) 08/16/2016   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   Arthritis of knee, left 06/16/2013   S/P total knee replacement using cement 06/14/2013   Osteoarthritis of left knee 03/21/2013   BPH (benign prostatic hyperplasia)    GERD  (gastroesophageal reflux disease)    CAD (coronary artery disease)    Fever 03/20/2013   Altered mental state 03/20/2013   Postoperative fever 03/20/2013   PNA (pneumonia) 03/20/2013   Preop cardiovascular exam 11/24/2012   Peroneal tendonitis 10/05/2012   Arthritis of foot, degenerative 10/05/2012   HTN (hypertension) 09/25/2012   Hypercholesterolemia 09/25/2012   Atrial fibrillation ([place]) 06/15/2012   Long term current use of anticoagulant therapy 06/15/2012   Rotator cuff strain 02/04/2012   Shoulder pain 02/04/2012   History of colonic polyps 01/29/2011   High risk medication use 01/29/2011   Past Medical History:  Diagnosis Date   Adenomatous colon polyp 2001   Anxiety    Atrial fibrillation (HCC)    takes Coumadin daily   BPH (benign prostatic hyperplasia)    takes Proscar daily   CAD (coronary artery disease) 2009   a. s/p PTCA and stenting of mid-LAD and LCx in 2009 b. NST in 03/2018 showing prior infarct with no current ischemia   Carpal tunnel syndrome    right   Complication of anesthesia    hard to wake up   Diverticulosis 2007   tcs by Dr. Laural Golden   GERD (gastroesophageal reflux disease)    Gout    Hypercholesterolemia    takes Atorvastatin daily   Hypertension    takes Imdur,Coreg,and Lisinopril daily   Hypothyroidism    takes SYnthroid daily   Joint pain    Joint swelling    Nocturia    Numbness    right hand pointer and middle finger   Osteoarthritis    left knee   Pneumonia    many,many yrs ago   Tubular adenoma 2001    Family History  Problem Relation Age of Onset   Pneumonia Father    Heart disease Brother        open heart surgery at age 41   Colon cancer Neg Hx     Past Surgical History:  Procedure Laterality Date   Barbie Banner OSTEOTOMY Right 03/17/2013   Procedure: Barbie Banner OSTEOTOMY;  Surgeon: Marcheta Grammes, DPM;  Location: [facility];  Service: Orthopedics;  Laterality: Right;   BUNIONECTOMY Right 03/17/2013   Procedure:  BUNIONECTOMY, Arthroplasty 2nd toe right foot;  Surgeon: Marcheta Grammes, DPM;  Location: [facility];  Service: Orthopedics;  Laterality: Right;   CARDIAC CATHETERIZATION  06/11/2007   PTCA X2 in 06/2007:  2 overlapping Cypher stents to LAD (2.5x13 and 3.0x23)   CARDIAC CATHETERIZATION  06/21/2007   Cypher stent 2.5 x 13  to OM branch   CARDIOVERSION  12/12/2011   Procedure: CARDIOVERSION;  Surgeon: Sanda Klein, MD;  Location: [facility];  Service: Cardiovascular;  Laterality: N/A;   CATARACT EXTRACTION  bilateral   COLONOSCOPY  2007   Dr. Laural Golden- L sided diverticulosis. Next TCS 2012 due to h/o tubular adenoma.   COLONOSCOPY  02/26/2011   Procedure: COLONOSCOPY;  Surgeon: Daneil Dolin, MD;  [facility];  Service: Endoscopy;  Laterality: N/A;  11:05   CORONARY ANGIOPLASTY     x 3    FLEXOR TENOTOMY  Left 03/17/2013   Procedure: PERCUTANEOUS FLEXOR TENOTOMY 3RD TOE LEFT FOOT;  Surgeon: Marcheta Grammes, DPM;  Location: [facility];  Service: Orthopedics;  Laterality: Left;   HERNIA REPAIR  2009   left inguinal   rot Left    TEE WITHOUT CARDIOVERSION  12/12/2011   Procedure: TRANSESOPHAGEAL ECHOCARDIOGRAM (TEE);  Surgeon: Sanda Klein, MD;  Location: [facility];  Service: Cardiovascular;  Laterality: N/A;   successful/sinus rhythm   TOTAL KNEE ARTHROPLASTY Left 06/14/2013   Procedure: TOTAL KNEE ARTHROPLASTY;  Surgeon: Garald Balding, MD;  Location: [facility];  Service: Orthopedics;  Laterality: Left;   Social History   Occupational History   Occupation: retired  Tobacco Use   Smoking status: Never   Smokeless tobacco: Never  Vaping Use   Vaping Use: Never used  Substance and Sexual Activity   Alcohol use: No   Drug use: No   Sexual activity: Not on file     Garald Balding, MD   Note - This record has been created using Editor, commissioning.  Chart creation errors have been sought, but may not always  have been located. Such creation errors do not  reflect on  the standard of medical care.

## 2021-10-03 ENCOUNTER — Other Ambulatory Visit: Payer: Self-pay | Admitting: Cardiovascular Disease

## 2021-10-17 ENCOUNTER — Ambulatory Visit: Payer: Medicare Other | Admitting: Orthopaedic Surgery

## 2021-10-17 ENCOUNTER — Ambulatory Visit (INDEPENDENT_AMBULATORY_CARE_PROVIDER_SITE_OTHER): Payer: Medicare Other

## 2021-10-17 DIAGNOSIS — M79641 Pain in right hand: Secondary | ICD-10-CM | POA: Diagnosis not present

## 2021-10-17 DIAGNOSIS — M25531 Pain in right wrist: Secondary | ICD-10-CM

## 2021-10-17 NOTE — Progress Notes (Signed)
Office Visit Note   Patient: Jerry Cain           Date of Birth: [birth year]           MRN: [id number] Visit Date: 10/17/2021              Requested by: No referring provider defined for this encounter. PCP: Patient, No Pcp Per   Assessment & Plan: Visit Diagnoses:  1. Pain in right wrist   2. Pain in right hand     Plan: He has a wrist splint documents were cut so he can use a son at the wrist splint since he has developed a rash related to weather and current heat outside.  Discussed using the splint when he is doing increase activities that are going to involve the hand and wrist.  We discussed options including wrist fusion.  Return as needed.  Follow-Up Instructions: Return if symptoms worsen or fail to improve.   Orders:  Orders Placed This Encounter  Procedures   XR Wrist Complete Right   No orders of the defined types were placed in this encounter.     Procedures: No procedures performed   Clinical Data: No additional findings.   Subjective: Chief Complaint  Patient presents with   Right Hand - Follow-up    HPI 88 year old male tossed a 25 pound bag of cord with his right wrist with severe pain past history of 40 years ago breaking his wrist and x-rays show evidence of scaphoid fracture with SLACK wrist.  Patient is right-hand dominant.  He is trying to snap a lot of beans and has had increased pain in that history of GI bleed has atrial fibs and is on chronic Coumadin.  Review of Systems all the systems noncontributory.   Objective: Vital Signs: There were no vitals taken for this visit.  Physical Exam Constitutional:      Appearance: He is well-developed.  HENT:     Head: Normocephalic and atraumatic.     Right Ear: External ear normal.     Left Ear: External ear normal.  Eyes:     Pupils: Pupils are equal, round, and reactive to light.  Neck:     Thyroid: No thyromegaly.     Trachea: No tracheal deviation.  Cardiovascular:      Comments: afib Pulmonary:     Effort: Pulmonary effort is normal.     Breath sounds: No wheezing.  Abdominal:     General: Bowel sounds are normal.     Palpations: Abdomen is soft.  Musculoskeletal:     Cervical back: Neck supple.  Skin:    General: Skin is warm and dry.     Capillary Refill: Capillary refill takes less than 2 seconds.  Neurological:     Mental Status: He is alert and oriented to person, place, and time.  Psychiatric:        Behavior: Behavior normal.        Thought Content: Thought content normal.        Judgment: Judgment normal.     Ortho Exam  Wrist effusion less than 30% flexion extension pain with ulnar and radial deviation greater than 70% limitation.  Specialty Comments:  No specialty comments available.  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   Arthritis of knee, left 06/16/2013   S/P total knee replacement using cement 06/14/2013   Osteoarthritis of left knee 03/21/2013   BPH (benign prostatic hyperplasia)    GERD (gastroesophageal reflux disease)    CAD (coronary artery disease)    Fever 03/20/2013   Altered mental state 03/20/2013   Postoperative fever 03/20/2013   PNA (pneumonia) 03/20/2013   Preop  cardiovascular exam 11/24/2012   Peroneal tendonitis 10/05/2012   Arthritis of foot, degenerative 10/05/2012   HTN (hypertension) 09/25/2012   Hypercholesterolemia 09/25/2012   Atrial fibrillation ([place]) 06/15/2012   Long term current use of anticoagulant therapy 06/15/2012   Rotator cuff strain 02/04/2012   Shoulder pain 02/04/2012   History of colonic polyps 01/29/2011   High risk medication use 01/29/2011   Past Medical History:  Diagnosis Date   Adenomatous colon polyp 2001   Anxiety    Atrial fibrillation (HCC)    takes Coumadin daily   BPH (benign prostatic hyperplasia)    takes Proscar daily   CAD (coronary artery disease) 2009   a. s/p PTCA and stenting of mid-LAD and LCx in 2009 b. NST in 03/2018 showing prior infarct with no current ischemia   Carpal tunnel syndrome    right   Complication of anesthesia    hard to wake up   Diverticulosis 2007   tcs by Dr. Laural Golden   GERD (gastroesophageal reflux disease)    Gout    Hypercholesterolemia    takes Atorvastatin daily   Hypertension    takes Imdur,Coreg,and Lisinopril daily   Hypothyroidism    takes SYnthroid daily   Joint pain    Joint swelling    Nocturia    Numbness    right hand pointer and middle finger   Osteoarthritis    left knee   Pneumonia    many,many yrs ago   Tubular adenoma 2001    Family History  Problem Relation Age of Onset   Pneumonia Father    Heart disease Brother        open heart surgery at age 30   Colon cancer Neg Hx     Past Surgical History:  Procedure Laterality Date   Barbie Banner OSTEOTOMY Right 03/17/2013   Procedure: Barbie Banner OSTEOTOMY;  Surgeon: Marcheta Grammes, DPM;  [facility];  Service: Orthopedics;  Laterality: Right;   BUNIONECTOMY Right 03/17/2013   Procedure: BUNIONECTOMY, Arthroplasty 2nd toe right foot;  Surgeon: Marcheta Grammes, DPM;  Location: [facility];  Service: Orthopedics;  Laterality: Right;   CARDIAC CATHETERIZATION  06/11/2007   PTCA X2 in 06/2007:   2 overlapping Cypher stents to LAD (2.5x13 and 3.0x23)   CARDIAC CATHETERIZATION  06/21/2007   Cypher stent 2.5 x 13  to OM branch   CARDIOVERSION  12/12/2011   Procedure: CARDIOVERSION;  Surgeon: Sanda Klein, MD;  Location: [facility];  Service: Cardiovascular;  Laterality: N/A;   CATARACT EXTRACTION     bilateral   COLONOSCOPY  2007   Dr. Laural Golden- L sided diverticulosis. Next TCS 2012 due to h/o tubular adenoma.  COLONOSCOPY  02/26/2011   Procedure: COLONOSCOPY;  Surgeon: Daneil Dolin, MD;  [facility];  Service: Endoscopy;  Laterality: N/A;  11:05   CORONARY ANGIOPLASTY     x 3    FLEXOR TENOTOMY  Left 03/17/2013   Procedure: PERCUTANEOUS FLEXOR TENOTOMY 3RD TOE LEFT FOOT;  Surgeon: Marcheta Grammes, DPM;  Location: [facility];  Service: Orthopedics;  Laterality: Left;   HERNIA REPAIR  2009   left inguinal   rot Left    TEE WITHOUT CARDIOVERSION  12/12/2011   Procedure: TRANSESOPHAGEAL ECHOCARDIOGRAM (TEE);  Surgeon: Sanda Klein, MD;  Location: [facility];  Service: Cardiovascular;  Laterality: N/A;   successful/sinus rhythm   TOTAL KNEE ARTHROPLASTY Left 06/14/2013   Procedure: TOTAL KNEE ARTHROPLASTY;  Surgeon: Garald Balding, MD;  Location: [facility];  Service: Orthopedics;  Laterality: Left;   Social History   Occupational History   Occupation: retired  Tobacco Use   Smoking status: Never   Smokeless tobacco: Never  Vaping Use   Vaping Use: Never used  Substance and Sexual Activity   Alcohol use: No   Drug use: No   Sexual activity: Not on file

## 2021-10-24 ENCOUNTER — Ambulatory Visit (INDEPENDENT_AMBULATORY_CARE_PROVIDER_SITE_OTHER): Payer: Medicare Other | Admitting: Urology

## 2021-10-24 ENCOUNTER — Encounter: Payer: Self-pay | Admitting: Urology

## 2021-10-24 VITALS — BP 118/72 | HR 88

## 2021-10-24 DIAGNOSIS — R339 Retention of urine, unspecified: Secondary | ICD-10-CM

## 2021-10-24 DIAGNOSIS — R3915 Urgency of urination: Secondary | ICD-10-CM | POA: Diagnosis not present

## 2021-10-24 DIAGNOSIS — N3941 Urge incontinence: Secondary | ICD-10-CM

## 2021-10-24 DIAGNOSIS — R351 Nocturia: Secondary | ICD-10-CM

## 2021-10-24 DIAGNOSIS — N400 Enlarged prostate without lower urinary tract symptoms: Secondary | ICD-10-CM

## 2021-10-24 DIAGNOSIS — N401 Enlarged prostate with lower urinary tract symptoms: Secondary | ICD-10-CM | POA: Diagnosis not present

## 2021-10-24 DIAGNOSIS — N138 Other obstructive and reflux uropathy: Secondary | ICD-10-CM | POA: Diagnosis not present

## 2021-10-24 LAB — URINALYSIS, ROUTINE W REFLEX MICROSCOPIC
Bilirubin, UA: NEGATIVE
Glucose, UA: NEGATIVE
Ketones, UA: NEGATIVE
Leukocytes,UA: NEGATIVE
Nitrite, UA: NEGATIVE
Protein,UA: NEGATIVE
RBC, UA: NEGATIVE
Specific Gravity, UA: <1.005 — ABNORMAL LOW (ref 1.005–1.030)
Urobilinogen, Ur: 0.2 mg/dL (ref 0.2–1.0)
pH, UA: 5.5 (ref 5.0–7.5)

## 2021-10-24 MED ORDER — FINASTERIDE 5 MG PO TABS: 5.0000 mg | ORAL_TABLET | Freq: Every day | ORAL | 3 refills | Status: DC

## 2021-10-24 NOTE — Progress Notes (Signed)
post void residual=79 

## 2021-10-24 NOTE — Progress Notes (Signed)
PVR 155 Subjective:  1. Benign prostatic hyperplasia, unspecified whether lower urinary tract symptoms present   2. Incomplete bladder emptying   3. Nocturia   4. Urgency of urination   5. Urge incontinence      Jerry Cain is a 88 year-old male established patient who is here for follow up regarding further evaluation of BPH and lower urinary tract symptoms.  The patient complains of lower urinary tract symptom(s) that include nocturia and sense of incomplete emptying. The patient states his most bothersome symptom(s) are the following: nocturia. His symptoms have been stable over the last year.   07/26/20: [name] returns today in f/u. He has a several year history of BPH with BOO with an elevated PSA. He remains on finasteride. He remains off of alpha blockers which caused side effects. He is on prn furosemide and voids well on that. He has variable nocturia 3-5x but feels it is worse than last year. His IPSS is 9. The urgency is only aggravated by the furosemide. His PSA was 1.1 on the finasteride two years ago and we decided further testing was not indicated. He has no hesitancy and a good stream. He feels like he is emptying well most of the time. He has no hematuria or dysuria. He has a history of  a negative biopsy remotely for the elevated PSA. He has no associated signs or symptoms.   10/18/20: [name] returns for his history of BPH with BOO.  He remains on finasteride.   His PVR is 136m.  His UA is clear. IPSS is 6.  He has nocturia x 2. He was given silodosin '4mg'$  at his last visit but he didn't tolerate it and stopped it.   04/25/21: Mr. SFerrisreturns today in f/u.  He remains on finasteride.  He a 6 month history of increased urgency and UUI if he eats too much sodium during the day.  His family had COVID in December.  They all recovered but his wife had some hearing issues but that has resolved.  He has no dysuria or hematuria.  His PVR is stable at 1675mand  his IPSS is 22.   10/24/21: Mr. StSinningeturns for his history of BPH with BOO.  He remains on finasteride.  HIs IPSS is 13.   He has no incontinence.  He has had no hematuria or dysuria.  His PVR is 7996m       ROS:  ROS:  A complete review of systems was performed.  All systems are negative except for pertinent findings as noted.   ROS  Allergies  Allergen Reactions   Carbapenems Other (See Comments)    Altered mental status   Cephalosporins     Unknown reaction   Nsaids Other (See Comments)    On blood thinner    Penicillins Swelling and Other (See Comments)    Did it involve swelling of the face/tongue/throat, SOB, or low BP? Unknown-possible swelling Did it involve sudden or severe rash/hives, skin peeling, or any reaction on the inside of your mouth or nose? Unknown Did you need to seek medical attention at a hospital or doctor's office? Unknown When did it last happen? Over 10 years (possible swelling)      If all above answers are "NO", may proceed with cephalosporin use.     Outpatient Encounter Medications as of 10/24/2021  Medication Sig   albuterol (PROVENTIL) (2.5 MG/3ML) 0.083% nebulizer solution USE 1 VIAL IN NEBULIZER EVERY 6 HOURS AS NEEDED.  Calcium Carbonate-Vitamin D 600-200 MG-UNIT TABS Take 1 tablet by mouth 2 (two) times daily.   carvedilol (COREG) 25 MG tablet TAKE (1) TABLET BY MOUTH TWICE DAILY WITH A MEAL.   finasteride (PROSCAR) 5 MG tablet Take 1 tablet (5 mg total) by mouth daily.   furosemide (LASIX) 20 MG tablet TAKE (1) TABLET BY MOUTH ONCE DAILY.   Glucosamine 750 MG TABS Take 750 mg by mouth 2 (two) times daily.   isosorbide mononitrate (IMDUR) 60 MG 24 hr tablet TAKE ONE TABLET ONCE DAILY IN THE MORNING.   levothyroxine (SYNTHROID) 150 MCG tablet Take 1 tablet (150 mcg total) by mouth daily before breakfast.   lisinopril (ZESTRIL) 20 MG tablet TAKE (1) TABLET BY MOUTH ONCE DAILY.   Multiple Vitamin (MULTIVITAMIN) capsule Take 1  capsule by mouth every morning.   Nebulizer MISC 1 Units by Does not apply route daily.   nitroGLYCERIN (NITROSTAT) 0.4 MG SL tablet DISSOLVE 1 TABLET UNDER TONGUE EVERY 5 MINUTES UP TO 15 MIN FOR CHEST PAIN. IF NO RELIEF CALL 911.   rosuvastatin (CRESTOR) 10 MG tablet TAKE (1) TABLET BY MOUTH ONCE DAILY.   traMADol (ULTRAM) 50 MG tablet Take 1 tablet (50 mg total) by mouth every 6 (six) hours as needed.   warfarin (COUMADIN) 5 MG tablet TAKE (1) TABLET BY MOUTH ONCE DAILY OR AS DIRECTED BY COUMADIN CLINIC.   [DISCONTINUED] finasteride (PROSCAR) 5 MG tablet TAKE 1 TABLET DAILY.   No facility-administered encounter medications on file as of 10/24/2021.    Past Medical History:  Diagnosis Date   Adenomatous colon polyp 2001   Anxiety    Atrial fibrillation (HCC)    takes Coumadin daily   BPH (benign prostatic hyperplasia)    takes Proscar daily   CAD (coronary artery disease) 2009   a. s/p PTCA and stenting of mid-LAD and LCx in 2009 b. NST in 03/2018 showing prior infarct with no current ischemia   Carpal tunnel syndrome    right   Complication of anesthesia    hard to wake up   Diverticulosis 2007   tcs by Dr. Laural Golden   GERD (gastroesophageal reflux disease)    Gout    Hypercholesterolemia    takes Atorvastatin daily   Hypertension    takes Imdur,Coreg,and Lisinopril daily   Hypothyroidism    takes SYnthroid daily   Joint pain    Joint swelling    Nocturia    Numbness    right hand pointer and middle finger   Osteoarthritis    left knee   Pneumonia    many,many yrs ago   Tubular adenoma 2001    Past Surgical History:  Procedure Laterality Date   Barbie Banner OSTEOTOMY Right 03/17/2013   Procedure: Barbie Banner OSTEOTOMY;  Surgeon: Marcheta Grammes, DPM;  Location: [facility];  Service: Orthopedics;  Laterality: Right;   BUNIONECTOMY Right 03/17/2013   Procedure: BUNIONECTOMY, Arthroplasty 2nd toe right foot;  Surgeon: Marcheta Grammes, DPM;  Location: [facility];  Service:  Orthopedics;  Laterality: Right;   CARDIAC CATHETERIZATION  06/11/2007   PTCA X2 in 06/2007:  2 overlapping Cypher stents to LAD (2.5x13 and 3.0x23)   CARDIAC CATHETERIZATION  06/21/2007   Cypher stent 2.5 x 13  to OM branch   CARDIOVERSION  12/12/2011   Procedure: CARDIOVERSION;  Surgeon: Sanda Klein, MD;  Location: [facility];  Service: Cardiovascular;  Laterality: N/A;   CATARACT EXTRACTION     bilateral   COLONOSCOPY  2007   Dr. Laural Golden- L  sided diverticulosis. Next TCS 2012 due to h/o tubular adenoma.   COLONOSCOPY  02/26/2011   Procedure: COLONOSCOPY;  Surgeon: Daneil Dolin, MD;  Location: [facility];  Service: Endoscopy;  Laterality: N/A;  11:05   CORONARY ANGIOPLASTY     x 3    FLEXOR TENOTOMY  Left 03/17/2013   Procedure: PERCUTANEOUS FLEXOR TENOTOMY 3RD TOE LEFT FOOT;  Surgeon: Marcheta Grammes, DPM;  Location: [facility];  Service: Orthopedics;  Laterality: Left;   HERNIA REPAIR  2009   left inguinal   rot Left    TEE WITHOUT CARDIOVERSION  12/12/2011   Procedure: TRANSESOPHAGEAL ECHOCARDIOGRAM (TEE);  Surgeon: Sanda Klein, MD;  Location: [facility];  Service: Cardiovascular;  Laterality: N/A;   successful/sinus rhythm   TOTAL KNEE ARTHROPLASTY Left 06/14/2013   Procedure: TOTAL KNEE ARTHROPLASTY;  Surgeon: Garald Balding, MD;  Location: [facility];  Service: Orthopedics;  Laterality: Left;    Social History   Socioeconomic History   Marital status: Married    Spouse name: Not on file   Number of children: 4   Years of education: Not on file   Highest education level: Not on file  Occupational History   Occupation: retired  Tobacco Use   Smoking status: Never   Smokeless tobacco: Never  Vaping Use   Vaping Use: Never used  Substance and Sexual Activity   Alcohol use: No   Drug use: No   Sexual activity: Not on file  Other Topics Concern   Not on file  Social History Narrative   Not on file   Social Determinants of Health   Financial Resource  Strain: Not on file  Food Insecurity: Not on file  Transportation Needs: Not on file  Physical Activity: Not on file  Stress: Not on file  Social Connections: Not on file  Intimate Partner Violence: Not on file    Family History  Problem Relation Age of Onset   Pneumonia Father    Heart disease Brother        open heart surgery at age 68   Colon cancer Neg Hx        Objective: Vitals:   10/24/21 1041  BP: 118/72  Pulse: 88     Physical Exam  Lab Results:  Results for orders placed or performed in visit on 10/24/21 (from the past 24 hour(s))  Urinalysis, Routine w reflex microscopic     Status: Abnormal   Collection Time: 10/24/21 10:54 AM  Result Value Ref Range   Specific Gravity, UA <1.005 (L) 1.005 - 1.030   pH, UA 5.5 5.0 - 7.5   Color, UA Yellow Yellow   Appearance Ur Clear Clear   Leukocytes,UA Negative Negative   Protein,UA Negative Negative/Trace   Glucose, UA Negative Negative   Ketones, UA Negative Negative   RBC, UA Negative Negative   Bilirubin, UA Negative Negative   Urobilinogen, Ur 0.2 0.2 - 1.0 mg/dL   Nitrite, UA Negative Negative   Microscopic Examination Comment    Narrative   Performed at:  [facility] [address] [place], [place]  [id number] Lab Director: Mina Marble MT, Phone:  [phone]     BMET No results for input(s): "NA", "K", "CL", "CO2", "GLUCOSE", "BUN", "CREATININE", "CALCIUM" in the last 72 hours. PSA No results found for: "PSA" No results found for: "TESTOSTERONE"  UA is clear.  Results for orders placed or performed in visit on 10/24/21 (from the past 24 hour(s))  Urinalysis, Routine w  reflex microscopic     Status: Abnormal   Collection Time: 10/24/21 10:54 AM  Result Value Ref Range   Specific Gravity, UA <1.005 (L) 1.005 - 1.030   pH, UA 5.5 5.0 - 7.5   Color, UA Yellow Yellow   Appearance Ur Clear Clear   Leukocytes,UA Negative Negative   Protein,UA Negative Negative/Trace   Glucose, UA  Negative Negative   Ketones, UA Negative Negative   RBC, UA Negative Negative   Bilirubin, UA Negative Negative   Urobilinogen, Ur 0.2 0.2 - 1.0 mg/dL   Nitrite, UA Negative Negative   Microscopic Examination Comment    Narrative   Performed at:  [facility] [address], [place], [place]  [id number] Lab Director: [place], Phone:  [phone]      Studies/Results: No results found.  PVR is 59m  Assessment & Plan: BPH with BOO with moderate LUTS.   He will continue the finasteride.  He will return in a year PVR.     Meds ordered this encounter  Medications   finasteride (PROSCAR) 5 MG tablet    Sig: Take 1 tablet (5 mg total) by mouth daily.    Dispense:  90 tablet    Refill:  3      Orders Placed This Encounter  Procedures   Urinalysis, Routine w reflex microscopic   BLADDER SCAN AMB NON-IMAGING      Return in about 1 year (around 10/25/2022) for for PVR..Marland Kitchen  CC: Patient, No Pcp Per      JIrine Seal7/20/2023 Patient ID: [id number] Flank male   DOB: [birth year] 88y.o.   MRN: [id number]

## 2021-10-31 ENCOUNTER — Ambulatory Visit (INDEPENDENT_AMBULATORY_CARE_PROVIDER_SITE_OTHER): Payer: Medicare Other | Admitting: *Deleted

## 2021-10-31 DIAGNOSIS — I4891 Unspecified atrial fibrillation: Secondary | ICD-10-CM

## 2021-10-31 DIAGNOSIS — Z5181 Encounter for therapeutic drug level monitoring: Secondary | ICD-10-CM

## 2021-10-31 LAB — POCT INR: INR: 1.9 — AB (ref 2.0–3.0)

## 2021-10-31 NOTE — Patient Instructions (Signed)
Description   Take warfarin 1 tablet today, then START taking warfarin 1/2 a tablet daily except for 1 tablet on Mondays, Wednesday and Friday.  Recheck in 2 weeks

## 2021-11-13 ENCOUNTER — Other Ambulatory Visit: Payer: Self-pay | Admitting: Cardiovascular Disease

## 2021-11-18 ENCOUNTER — Ambulatory Visit (INDEPENDENT_AMBULATORY_CARE_PROVIDER_SITE_OTHER): Payer: Medicare Other | Admitting: *Deleted

## 2021-11-18 DIAGNOSIS — I4891 Unspecified atrial fibrillation: Secondary | ICD-10-CM

## 2021-11-18 DIAGNOSIS — Z5181 Encounter for therapeutic drug level monitoring: Secondary | ICD-10-CM

## 2021-11-18 LAB — POCT INR: INR: 1.5 — AB (ref 2.0–3.0)

## 2021-11-18 NOTE — Patient Instructions (Signed)
Take warfarin extra 1/2 tablet today then increase dose to 1 tablet daily except 1/2 tablet on Sundays, Tuesdays and Thursdays.  Recheck in 2 weeks

## 2021-11-19 ENCOUNTER — Other Ambulatory Visit: Payer: Self-pay | Admitting: Cardiovascular Disease

## 2021-12-02 ENCOUNTER — Ambulatory Visit: Payer: Medicare Other | Attending: Cardiology | Admitting: *Deleted

## 2021-12-02 DIAGNOSIS — I4891 Unspecified atrial fibrillation: Secondary | ICD-10-CM

## 2021-12-02 DIAGNOSIS — Z5181 Encounter for therapeutic drug level monitoring: Secondary | ICD-10-CM

## 2021-12-02 LAB — POCT INR: INR: 2.5 (ref 2.0–3.0)

## 2021-12-02 NOTE — Patient Instructions (Signed)
Continue warfarin 1 tablet daily except 1/2 tablet on Sundays, Tuesdays and Thursdays.  Recheck in 4 weeks

## 2021-12-12 ENCOUNTER — Other Ambulatory Visit: Payer: Self-pay | Admitting: Cardiovascular Disease

## 2021-12-26 ENCOUNTER — Other Ambulatory Visit: Payer: Self-pay | Admitting: Cardiovascular Disease

## 2021-12-26 DIAGNOSIS — I4821 Permanent atrial fibrillation: Secondary | ICD-10-CM

## 2021-12-26 NOTE — Telephone Encounter (Signed)
Prescription refill request received for warfarin Lov: 08/08/21 ([facility]) Next INR check: 12/31/21 Warfarin tablet strength: '5mg'$   Appropriate dose and refill sent to requested pharmacy.

## 2021-12-31 ENCOUNTER — Ambulatory Visit: Payer: Medicare Other | Attending: Cardiology | Admitting: *Deleted

## 2021-12-31 DIAGNOSIS — I4891 Unspecified atrial fibrillation: Secondary | ICD-10-CM | POA: Diagnosis not present

## 2021-12-31 DIAGNOSIS — Z5181 Encounter for therapeutic drug level monitoring: Secondary | ICD-10-CM | POA: Diagnosis not present

## 2021-12-31 LAB — POCT INR: INR: 2.2 (ref 2.0–3.0)

## 2021-12-31 NOTE — Patient Instructions (Signed)
Continue warfarin 1 tablet daily except 1/2 tablet on Sundays, Tuesdays and Thursdays.  Recheck in 4 weeks

## 2022-01-01 ENCOUNTER — Other Ambulatory Visit: Payer: Self-pay | Admitting: Student

## 2022-01-01 NOTE — Telephone Encounter (Signed)
yes

## 2022-01-11 ENCOUNTER — Other Ambulatory Visit: Payer: Self-pay | Admitting: Cardiovascular Disease

## 2022-01-28 ENCOUNTER — Ambulatory Visit: Payer: Medicare Other | Attending: Cardiology | Admitting: *Deleted

## 2022-01-28 DIAGNOSIS — I4891 Unspecified atrial fibrillation: Secondary | ICD-10-CM | POA: Diagnosis not present

## 2022-01-28 DIAGNOSIS — Z5181 Encounter for therapeutic drug level monitoring: Secondary | ICD-10-CM

## 2022-01-28 LAB — POCT INR: INR: 3 (ref 2.0–3.0)

## 2022-01-28 NOTE — Patient Instructions (Signed)
Continue warfarin 1 tablet daily except 1/2 tablet on Sundays, Tuesdays and Thursdays.  Recheck in 6 weeks 

## 2022-01-30 ENCOUNTER — Ambulatory Visit: Payer: Medicare Other | Admitting: Urology

## 2022-02-06 ENCOUNTER — Other Ambulatory Visit: Payer: Self-pay | Admitting: Cardiovascular Disease

## 2022-02-20 ENCOUNTER — Other Ambulatory Visit: Payer: Self-pay | Admitting: Cardiovascular Disease

## 2022-03-06 ENCOUNTER — Encounter: Payer: Self-pay | Admitting: Urology

## 2022-03-06 ENCOUNTER — Ambulatory Visit (INDEPENDENT_AMBULATORY_CARE_PROVIDER_SITE_OTHER): Payer: Medicare Other | Admitting: Urology

## 2022-03-06 VITALS — BP 152/74 | HR 76

## 2022-03-06 DIAGNOSIS — R3915 Urgency of urination: Secondary | ICD-10-CM

## 2022-03-06 DIAGNOSIS — N400 Enlarged prostate without lower urinary tract symptoms: Secondary | ICD-10-CM

## 2022-03-06 DIAGNOSIS — N3941 Urge incontinence: Secondary | ICD-10-CM

## 2022-03-06 DIAGNOSIS — R339 Retention of urine, unspecified: Secondary | ICD-10-CM | POA: Diagnosis not present

## 2022-03-06 DIAGNOSIS — R351 Nocturia: Secondary | ICD-10-CM | POA: Diagnosis not present

## 2022-03-06 DIAGNOSIS — N401 Enlarged prostate with lower urinary tract symptoms: Secondary | ICD-10-CM | POA: Diagnosis not present

## 2022-03-06 LAB — BLADDER SCAN AMB NON-IMAGING: Scan Result: 135

## 2022-03-06 NOTE — Progress Notes (Signed)
Pt here today for bladder scan. Bladder was scanned and 135 was visualized.   Performed by Marisue Brooklyn, CMA

## 2022-03-06 NOTE — Progress Notes (Signed)
Subjective:  1. Benign prostatic hyperplasia, unspecified whether lower urinary tract symptoms present   2. Incomplete bladder emptying   3. Nocturia   4. Urgency of urination   5. Urge incontinence      Jerry Cain is a 88 year-old male established patient who is here for follow up regarding further evaluation of BPH and lower urinary tract symptoms.  The patient complains of lower urinary tract symptom(s) that include nocturia and sense of incomplete emptying. The patient states his most bothersome symptom(s) are the following: nocturia. His symptoms have been stable over the last year.   07/26/20: [name] returns today in f/u. He has a several year history of BPH with BOO with an elevated PSA. He remains on finasteride. He remains off of alpha blockers which caused side effects. He is on prn furosemide and voids well on that. He has variable nocturia 3-5x but feels it is worse than last year. His IPSS is 9. The urgency is only aggravated by the furosemide. His PSA was 1.1 on the finasteride two years ago and we decided further testing was not indicated. He has no hesitancy and a good stream. He feels like he is emptying well most of the time. He has no hematuria or dysuria. He has a history of  a negative biopsy remotely for the elevated PSA. He has no associated signs or symptoms.   10/18/20: [name] returns for his history of BPH with BOO.  He remains on finasteride.   His PVR is 120m.  His UA is clear. IPSS is 6.  He has nocturia x 2. He was given silodosin '4mg'$  at his last visit but he didn't tolerate it and stopped it.   04/25/21: Mr. SKuzelreturns today in f/u.  He remains on finasteride.  He a 6 month history of increased urgency and UUI if he eats too much sodium during the day.  His family had COVID in December.  They all recovered but his wife had some hearing issues but that has resolved.  He has no dysuria or hematuria.  His PVR is stable at 1628mand his IPSS  is 22.   10/24/21: Mr. StCasaleeturns for his history of BPH with BOO.  He remains on finasteride.  HIs IPSS is 13.   He has no incontinence.  He has had no hematuria or dysuria.  His PVR is 7966m  03/06/22: Mr. StrKeeslingturns today in f/u for his history of BPH with BOO on finasteride.  His PVR is 135m14mHe has some AM frequency q30-60mi40mter his morning lasix and that will settle by mid day.  He has urgency when he stands and can have some mild UUI.  He has nocturia x 3.  His IPSS is 16.    IPSS     Row Name 03/06/22 1100         International Prostate Symptom Score   How often have you had the sensation of not emptying your bladder? More than half the time     How often have you had to urinate less than every two hours? Almost always     How often have you found you stopped and started again several times when you urinated? Not at All     How often have you found it difficult to postpone urination? About half the time     How often have you had a weak urinary stream? Less than 1 in 5 times     How  often have you had to strain to start urination? Not at All     How many times did you typically get up at night to urinate? 3 Times     Total IPSS Score 16       Quality of Life due to urinary symptoms   If you were to spend the rest of your life with your urinary condition just the way it is now how would you feel about that? Mostly Satisfied                 ROS:  ROS:  A complete review of systems was performed.  All systems are negative except for pertinent findings as noted.   Review of Systems  Musculoskeletal:  Positive for back pain and joint pain.  Endo/Heme/Allergies:  Bruises/bleeds easily.  Psychiatric/Behavioral:  Positive for memory loss.     Allergies  Allergen Reactions   Carbapenems Other (See Comments)    Altered mental status   Cephalosporins     Unknown reaction   Nsaids Other (See Comments)    On blood thinner    Penicillins Swelling and  Other (See Comments)    Did it involve swelling of the face/tongue/throat, SOB, or low BP? Unknown-possible swelling Did it involve sudden or severe rash/hives, skin peeling, or any reaction on the inside of your mouth or nose? Unknown Did you need to seek medical attention at a hospital or doctor's office? Unknown When did it last happen? Over 10 years (possible swelling)      If all above answers are "NO", may proceed with cephalosporin use.     Outpatient Encounter Medications as of 03/06/2022  Medication Sig   albuterol (PROVENTIL) (2.5 MG/3ML) 0.083% nebulizer solution USE 1 VIAL IN NEBULIZER EVERY 6 HOURS AS NEEDED.   Calcium Carbonate-Vitamin D 600-200 MG-UNIT TABS Take 1 tablet by mouth 2 (two) times daily.   carvedilol (COREG) 25 MG tablet TAKE (1) TABLET BY MOUTH TWICE DAILY WITH A MEAL.   finasteride (PROSCAR) 5 MG tablet Take 1 tablet (5 mg total) by mouth daily.   furosemide (LASIX) 20 MG tablet TAKE (1) TABLET BY MOUTH ONCE DAILY.   Glucosamine 750 MG TABS Take 750 mg by mouth 2 (two) times daily.   isosorbide mononitrate (IMDUR) 60 MG 24 hr tablet TAKE ONE TABLET ONCE DAILY IN THE MORNING.   levothyroxine (SYNTHROID) 150 MCG tablet Take 1 tablet (150 mcg total) by mouth daily before breakfast.   lisinopril (ZESTRIL) 20 MG tablet TAKE (1) TABLET BY MOUTH ONCE DAILY.   Multiple Vitamin (MULTIVITAMIN) capsule Take 1 capsule by mouth every morning.   Nebulizer MISC 1 Units by Does not apply route daily.   nitroGLYCERIN (NITROSTAT) 0.4 MG SL tablet DISSOLVE 1 TABLET UNDER TONGUE EVERY 5 MINUTES UP TO 15 MIN FOR CHEST PAIN. IF NO RELIEF CALL 911.   rosuvastatin (CRESTOR) 10 MG tablet TAKE (1) TABLET BY MOUTH ONCE DAILY.   traMADol (ULTRAM) 50 MG tablet Take 1 tablet (50 mg total) by mouth every 6 (six) hours as needed.   warfarin (COUMADIN) 5 MG tablet TAKE 1/2 TABLET TO 1 TABLET DAILY AS DIRECTED BY COUMADIN CLINIC   No facility-administered encounter medications on file as of  03/06/2022.    Past Medical History:  Diagnosis Date   Adenomatous colon polyp 2001   Anxiety    Atrial fibrillation (HCC)    takes Coumadin daily   BPH (benign prostatic hyperplasia)    takes Proscar daily   CAD (coronary  artery disease) 2009   a. s/p PTCA and stenting of mid-LAD and LCx in 2009 b. NST in 03/2018 showing prior infarct with no current ischemia   Carpal tunnel syndrome    right   Complication of anesthesia    hard to wake up   Diverticulosis 2007   tcs by Dr. Laural Golden   GERD (gastroesophageal reflux disease)    Gout    Hypercholesterolemia    takes Atorvastatin daily   Hypertension    takes Imdur,Coreg,and Lisinopril daily   Hypothyroidism    takes SYnthroid daily   Joint pain    Joint swelling    Nocturia    Numbness    right hand pointer and middle finger   Osteoarthritis    left knee   Pneumonia    many,many yrs ago   Tubular adenoma 2001    Past Surgical History:  Procedure Laterality Date   Barbie Banner OSTEOTOMY Right 03/17/2013   Procedure: Barbie Banner OSTEOTOMY;  Surgeon: Marcheta Grammes, DPM;  [facility]: [facility];  Service: Orthopedics;  Laterality: Right;   BUNIONECTOMY Right 03/17/2013   Procedure: BUNIONECTOMY, Arthroplasty 2nd toe right foot;  Surgeon: Marcheta Grammes, DPM;  Location: [facility];  Service: Orthopedics;  Laterality: Right;   CARDIAC CATHETERIZATION  06/11/2007   PTCA X2 in 06/2007:  2 overlapping Cypher stents to LAD (2.5x13 and 3.0x23)   CARDIAC CATHETERIZATION  06/21/2007   Cypher stent 2.5 x 13  to OM branch   CARDIOVERSION  12/12/2011   Procedure: CARDIOVERSION;  Surgeon: Sanda Klein, MD;  Location: [facility];  Service: Cardiovascular;  Laterality: N/A;   CATARACT EXTRACTION     bilateral   COLONOSCOPY  2007   Dr. Laural Golden- L sided diverticulosis. Next TCS 2012 due to h/o tubular adenoma.   COLONOSCOPY  02/26/2011   Procedure: COLONOSCOPY;  Surgeon: Daneil Dolin, MD;  [facility];  Service: Endoscopy;   Laterality: N/A;  11:05   CORONARY ANGIOPLASTY     x 3    FLEXOR TENOTOMY  Left 03/17/2013   Procedure: PERCUTANEOUS FLEXOR TENOTOMY 3RD TOE LEFT FOOT;  Surgeon: Marcheta Grammes, DPM;  Location: [facility];  Service: Orthopedics;  Laterality: Left;   HERNIA REPAIR  2009   left inguinal   rot Left    TEE WITHOUT CARDIOVERSION  12/12/2011   Procedure: TRANSESOPHAGEAL ECHOCARDIOGRAM (TEE);  Surgeon: Sanda Klein, MD;  Location: [facility];  Service: Cardiovascular;  Laterality: N/A;   successful/sinus rhythm   TOTAL KNEE ARTHROPLASTY Left 06/14/2013   Procedure: TOTAL KNEE ARTHROPLASTY;  Surgeon: Garald Balding, MD;  Location: [facility];  Service: Orthopedics;  Laterality: Left;    Social History   Socioeconomic History   Marital status: Married    Spouse name: Not on file   Number of children: 4   Years of education: Not on file   Highest education level: Not on file  Occupational History   Occupation: retired  Tobacco Use   Smoking status: Never   Smokeless tobacco: Never  Vaping Use   Vaping Use: Never used  Substance and Sexual Activity   Alcohol use: No   Drug use: No   Sexual activity: Not on file  Other Topics Concern   Not on file  Social History Narrative   Not on file   Social Determinants of Health   Financial Resource Strain: Not on file  Food Insecurity: Not on file  Transportation Needs: Not on file  Physical Activity: Not on file  Stress:  Not on file  Social Connections: Not on file  Intimate Partner Violence: Not on file    Family History  Problem Relation Age of Onset   Pneumonia Father    Heart disease Brother        open heart surgery at age 60   Colon cancer Neg Hx        Objective: Vitals:   03/06/22 1120  BP: (!) 152/74  Pulse: 76      Physical Exam Vitals reviewed.  Constitutional:      Appearance: Normal appearance.  Neurological:     Mental Status: He is alert.     Lab Results:  Results for orders placed or  performed in visit on 03/06/22 (from the past 24 hour(s))  Urinalysis, Routine w reflex microscopic     Status: Abnormal   Collection Time: 03/06/22 11:20 AM  Result Value Ref Range   Specific Gravity, UA <1.005 (L) 1.005 - 1.030   pH, UA 6.0 5.0 - 7.5   Color, UA Yellow Yellow   Appearance Ur Clear Clear   Leukocytes,UA Negative Negative   Protein,UA Negative Negative/Trace   Glucose, UA Negative Negative   Ketones, UA Negative Negative   RBC, UA Trace (A) Negative   Bilirubin, UA Negative Negative   Urobilinogen, Ur 0.2 0.2 - 1.0 mg/dL   Nitrite, UA Negative Negative   Microscopic Examination See below:    Narrative   Performed at:  [facility] [address], [place], [place]  [id number] Lab Director: Mina Marble MT, Phone:  [phone]  Microscopic Examination     Status: None   Collection Time: 03/06/22 11:20 AM   Urine  Result Value Ref Range   WBC, UA 0-5 0 - 5 /hpf   RBC, Urine 0-2 0 - 2 /hpf   Epithelial Cells (non renal) None seen 0 - 10 /hpf   Bacteria, UA None seen None seen/Few   Narrative   Performed at:  [facility], [place], [place]  [id number] Lab Director: [place], Phone:  [phone]      BMET No results for input(s): "NA", "K", "CL", "CO2", "GLUCOSE", "BUN", "CREATININE", "CALCIUM" in the last 72 hours. PSA No results found for: "PSA" No results found for: "TESTOSTERONE"  UA is clear.  Results for orders placed or performed in visit on 03/06/22 (from the past 24 hour(s))  Urinalysis, Routine w reflex microscopic     Status: Abnormal   Collection Time: 03/06/22 11:20 AM  Result Value Ref Range   Specific Gravity, UA <1.005 (L) 1.005 - 1.030   pH, UA 6.0 5.0 - 7.5   Color, UA Yellow Yellow   Appearance Ur Clear Clear   Leukocytes,UA Negative Negative   Protein,UA Negative Negative/Trace   Glucose, UA Negative Negative   Ketones, UA Negative Negative   RBC, UA Trace (A) Negative    Bilirubin, UA Negative Negative   Urobilinogen, Ur 0.2 0.2 - 1.0 mg/dL   Nitrite, UA Negative Negative   Microscopic Examination See below:    Narrative   Performed at:  [facility] [address] [place], [place]  [id number] Lab Director: Mina Marble MT, Phone:  [phone]  Microscopic Examination     Status: None   Collection Time: 03/06/22 11:20 AM   Urine  Result Value Ref Range   WBC, UA 0-5 0 - 5 /hpf   RBC, Urine 0-2 0 - 2 /hpf   Epithelial Cells (non renal)  None seen 0 - 10 /hpf   Bacteria, UA None seen None seen/Few   Narrative   Performed at:  [facility] [address], [place], [place]  [id number] Lab Director: [place], Phone:  [phone]       Studies/Results: No results found.  PVR is 157m  Assessment & Plan: BPH with BOO with moderate LUTS.   He will continue the finasteride.  He will return in a year PVR.     No orders of the defined types were placed in this encounter.     Orders Placed This Encounter  Procedures   Microscopic Examination   Urinalysis, Routine w reflex microscopic   BLADDER SCAN AMB NON-IMAGING      Return in about 6 months (around 09/04/2022) for with PVR.   CC: Patient, No Pcp Per      JIrine Seal12/04/2021 Patient ID: [id number] Flank male   DOB: [birth year] 88y.o.   MRN: [id number]

## 2022-03-07 LAB — URINALYSIS, ROUTINE W REFLEX MICROSCOPIC
Bilirubin, UA: NEGATIVE
Glucose, UA: NEGATIVE
Ketones, UA: NEGATIVE
Leukocytes,UA: NEGATIVE
Nitrite, UA: NEGATIVE
Protein,UA: NEGATIVE
Specific Gravity, UA: <1.005 — ABNORMAL LOW (ref 1.005–1.030)
Urobilinogen, Ur: 0.2 mg/dL (ref 0.2–1.0)
pH, UA: 6 (ref 5.0–7.5)

## 2022-03-07 LAB — MICROSCOPIC EXAMINATION
Bacteria, UA: NONE SEEN
Epithelial Cells (non renal): NONE SEEN /hpf (ref 0–10)

## 2022-03-11 ENCOUNTER — Ambulatory Visit: Payer: Medicare Other | Attending: Cardiology | Admitting: *Deleted

## 2022-03-11 DIAGNOSIS — Z5181 Encounter for therapeutic drug level monitoring: Secondary | ICD-10-CM

## 2022-03-11 DIAGNOSIS — I4891 Unspecified atrial fibrillation: Secondary | ICD-10-CM | POA: Diagnosis not present

## 2022-03-11 LAB — POCT INR: INR: 2.1 (ref 2.0–3.0)

## 2022-03-11 NOTE — Patient Instructions (Signed)
Continue warfarin 1 tablet daily except 1/2 tablet on Sundays, Tuesdays and Thursdays.  Recheck in 6 weeks

## 2022-03-14 ENCOUNTER — Other Ambulatory Visit: Payer: Self-pay | Admitting: Cardiovascular Disease

## 2022-03-14 DIAGNOSIS — I4821 Permanent atrial fibrillation: Secondary | ICD-10-CM

## 2022-03-14 NOTE — Telephone Encounter (Signed)
Last INR 03/11/2022 Last OV 08/08/2021

## 2022-04-02 ENCOUNTER — Other Ambulatory Visit: Payer: Self-pay | Admitting: Student

## 2022-04-22 ENCOUNTER — Ambulatory Visit: Payer: Medicare Other | Attending: Cardiology | Admitting: *Deleted

## 2022-04-22 DIAGNOSIS — I4891 Unspecified atrial fibrillation: Secondary | ICD-10-CM | POA: Diagnosis not present

## 2022-04-22 DIAGNOSIS — Z5181 Encounter for therapeutic drug level monitoring: Secondary | ICD-10-CM | POA: Diagnosis not present

## 2022-04-22 LAB — POCT INR: INR: 1.9 — AB (ref 2.0–3.0)

## 2022-04-22 NOTE — Patient Instructions (Signed)
Take warfarin 1 tablet tonight then resume 1 tablet daily except 1/2 tablet on Sundays, Tuesdays and Thursdays.  Recheck in 6 weeks

## 2022-05-22 ENCOUNTER — Other Ambulatory Visit: Payer: Self-pay | Admitting: Cardiovascular Disease

## 2022-06-03 ENCOUNTER — Ambulatory Visit: Payer: Medicare Other | Attending: Cardiology | Admitting: Pharmacist

## 2022-06-03 DIAGNOSIS — Z7901 Long term (current) use of anticoagulants: Secondary | ICD-10-CM

## 2022-06-03 DIAGNOSIS — I4891 Unspecified atrial fibrillation: Secondary | ICD-10-CM | POA: Diagnosis not present

## 2022-06-03 DIAGNOSIS — Z5181 Encounter for therapeutic drug level monitoring: Secondary | ICD-10-CM | POA: Diagnosis not present

## 2022-06-03 LAB — POCT INR: INR: 2.3 (ref 2.0–3.0)

## 2022-06-03 NOTE — Patient Instructions (Signed)
Description   Continue taking 1 tablet daily except 1/2 tablet on Sundays, Tuesdays and Thursdays.  Recheck in 6 weeks

## 2022-06-05 ENCOUNTER — Ambulatory Visit (INDEPENDENT_AMBULATORY_CARE_PROVIDER_SITE_OTHER): Payer: Medicare Other

## 2022-06-05 ENCOUNTER — Ambulatory Visit (INDEPENDENT_AMBULATORY_CARE_PROVIDER_SITE_OTHER): Payer: Medicare Other | Admitting: Orthopaedic Surgery

## 2022-06-05 DIAGNOSIS — M79671 Pain in right foot: Secondary | ICD-10-CM | POA: Diagnosis not present

## 2022-06-06 NOTE — Progress Notes (Signed)
Office Visit Note   Patient: Jerry Cain           Date of Birth: October 20, 1931           MRN: [id number] Visit Date: 06/05/2022              Requested by: No referring provider defined for this encounter. PCP: Patient, No Pcp Per   Assessment & Plan: Visit Diagnoses:  1. Pain in right foot     Plan: 2 calluses debrided.  Prescription for arch support with Plastizote insert for his midfoot breakdown.  We discussed working on the callus 2-3 times a week to keep it soft.  He does have a pumice stone.  Follow-up if he has ongoing problems.  Daughter was present and we included her in the discussion outlined treatment plan.  Follow-Up Instructions: No follow-ups on file.   Orders:  Orders Placed This Encounter  Procedures   XR Foot Complete Right   No orders of the defined types were placed in this encounter.     Procedures: No procedures performed   Clinical Data: No additional findings.   Subjective: Chief Complaint  Patient presents with   Right Foot - Pain    HPI 89 year old male here with his daughter with painful left foot mid foot collapse and large callus greater than a quarter sized with underlying blood blister.  He is not able to walk barefooted.  He is in [place] shoes has not had any specific inserts.  He has a grinder but is not able to get it down to the bottom of his foot.  Patient also has callus along the medial great toe he typically grinds.  Review of Systems positive coronary disease, heart failure on warfarin chronically.   Objective: Vital Signs: There were no vitals taken for this visit.  Physical Exam Constitutional:      Appearance: He is well-developed.  HENT:     Head: Normocephalic and atraumatic.     Right Ear: External ear normal.     Left Ear: External ear normal.  Eyes:     Pupils: Pupils are equal, round, and reactive to light.  Neck:     Thyroid: No thyromegaly.     Trachea: No tracheal deviation.  Cardiovascular:      Rate and Rhythm: Normal rate.  Pulmonary:     Effort: Pulmonary effort is normal.     Breath sounds: No wheezing.  Abdominal:     General: Bowel sounds are normal.     Palpations: Abdomen is soft.  Musculoskeletal:     Cervical back: Neck supple.  Skin:    General: Skin is warm and dry.     Capillary Refill: Capillary refill takes less than 2 seconds.  Neurological:     Mental Status: He is alert and oriented to person, place, and time.  Psychiatric:        Behavior: Behavior normal.        Thought Content: Thought content normal.        Judgment: Judgment normal.     Ortho Exam patient has pes planus midfoot collapse prominence of subcutaneous bone and large blood blister.  Using a 10 scalpel blade over period of 1520 minutes extremely hard callus was debrided away down to softer skin we debrided the callus along the medial aspect of the great toe at the IP joint as well.  Footcare discussed.  He needs some Plastizote inserts and arch support either custom insert that will fit  his current [place] shoes or designated orthopedic shoes.  Specialty Comments:  No specialty comments available.  Imaging: XR Foot Complete Right  Result Date: 06/06/2022 Three-view x-rays right foot demonstrates talonavicular subluxation with midfoot collapse.  Tarsometatarsal dorsal spurring.  Previous great toe proximal phalanx osteotomy and staple present.  Residual bunion deformity is noted. Impression midfoot collapse with some talonavicular subluxation.    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   Arthritis of knee, left 06/16/2013   S/P total knee replacement using cement 06/14/2013   Osteoarthritis of left knee 03/21/2013   BPH (benign prostatic hyperplasia)    GERD (gastroesophageal reflux disease)    CAD (coronary artery disease)    Fever 03/20/2013   Altered mental state 03/20/2013   Postoperative fever 03/20/2013   PNA (pneumonia) 03/20/2013   Preop cardiovascular exam 11/24/2012   Peroneal tendonitis 10/05/2012   Arthritis of foot, degenerative 10/05/2012   HTN (hypertension) 09/25/2012   Hypercholesterolemia 09/25/2012   Atrial fibrillation ([facility]) 06/15/2012   Long term current use of anticoagulant therapy 06/15/2012   Rotator cuff strain 02/04/2012   Shoulder pain 02/04/2012   History of colonic polyps 01/29/2011   High risk medication use 01/29/2011   Past Medical History:  Diagnosis Date   Adenomatous colon polyp 2001   Anxiety    Atrial fibrillation (HCC)    takes Coumadin daily   BPH (benign prostatic hyperplasia)    takes Proscar daily   CAD (coronary artery disease) 2009   a. s/p PTCA and stenting of mid-LAD and LCx in 2009 b. NST in 03/2018 showing prior infarct with no current ischemia   Carpal tunnel syndrome    right   Complication of anesthesia    hard to wake up   Diverticulosis 2007   tcs by Dr. Laural Golden   GERD (gastroesophageal reflux disease)    Gout    Hypercholesterolemia    takes Atorvastatin daily   Hypertension    takes Imdur,Coreg,and Lisinopril daily   Hypothyroidism    takes SYnthroid daily   Joint pain    Joint swelling    Nocturia    Numbness    right hand pointer and middle finger   Osteoarthritis    left knee   Pneumonia    many,many yrs ago   Tubular adenoma 2001    Family History  Problem Relation Age of Onset   Pneumonia Father    Heart disease Brother        open heart surgery at age 34   Colon cancer Neg Hx     Past Surgical  History:  Procedure Laterality Date   Barbie Banner OSTEOTOMY Right 03/17/2013   Procedure: Barbie Banner OSTEOTOMY;  Surgeon: Marcheta Grammes, DPM;  [facility];  Service: Orthopedics;  Laterality: Right;   BUNIONECTOMY Right 03/17/2013   Procedure: BUNIONECTOMY, Arthroplasty 2nd toe right foot;  Surgeon: Marcheta Grammes, DPM;  Location: [facility];  Service: Orthopedics;  Laterality: Right;   CARDIAC CATHETERIZATION  06/11/2007   PTCA X2 in 06/2007:  2 overlapping Cypher stents to LAD (  2.5x13 and 3.0x23)   CARDIAC CATHETERIZATION  06/21/2007   Cypher stent 2.5 x 13  to OM branch   CARDIOVERSION  12/12/2011   Procedure: CARDIOVERSION;  Surgeon: Sanda Klein, MD;  Location: [facility];  Service: Cardiovascular;  Laterality: N/A;   CATARACT EXTRACTION     bilateral   COLONOSCOPY  2007   Dr. Laural Golden- L sided diverticulosis. Next TCS 2012 due to h/o tubular adenoma.   COLONOSCOPY  02/26/2011   Procedure: COLONOSCOPY;  Surgeon: Daneil Dolin, MD;  [facility];  Service: Endoscopy;  Laterality: N/A;  11:05   CORONARY ANGIOPLASTY     x 3    FLEXOR TENOTOMY  Left 03/17/2013   Procedure: PERCUTANEOUS FLEXOR TENOTOMY 3RD TOE LEFT FOOT;  Surgeon: Marcheta Grammes, DPM;  [facility];  Service: Orthopedics;  Laterality: Left;   HERNIA REPAIR  2009   left inguinal   rot Left    TEE WITHOUT CARDIOVERSION  12/12/2011   Procedure: TRANSESOPHAGEAL ECHOCARDIOGRAM (TEE);  Surgeon: Sanda Klein, MD;  Location: [facility];  Service: Cardiovascular;  Laterality: N/A;   successful/sinus rhythm   TOTAL KNEE ARTHROPLASTY Left 06/14/2013   Procedure: TOTAL KNEE ARTHROPLASTY;  Surgeon: Garald Balding, MD;  Location: [facility];  Service: Orthopedics;  Laterality: Left;   Social History   Occupational History   Occupation: retired  Tobacco Use   Smoking status: Never   Smokeless tobacco: Never  Vaping Use   Vaping Use: Never used  Substance and Sexual Activity   Alcohol use: No    Drug use: No   Sexual activity: Not on file

## 2022-06-12 ENCOUNTER — Telehealth: Payer: Self-pay | Admitting: Cardiovascular Disease

## 2022-06-12 MED ORDER — LEVOTHYROXINE SODIUM 150 MCG PO TABS: 150.0000 ug | ORAL_TABLET | Freq: Every day | ORAL | 0 refills | Status: DC

## 2022-06-12 NOTE — Telephone Encounter (Signed)
Was unable to reach patient. Left pt voicemail to call the office if he had any questions or concerns.

## 2022-06-12 NOTE — Telephone Encounter (Signed)
*  STAT* If patient is at the pharmacy, call can be transferred to refill team.   1. Which medications need to be refilled? (please list name of each medication and dose if known)  levothyroxine (SYNTHROID) 150 MCG tablet  2. Which pharmacy/location (including street and city if local pharmacy) is medication to be sent to? [facility]  3. Do they need a 30 day or 90 day supply?   90 day supply

## 2022-07-01 ENCOUNTER — Ambulatory Visit: Payer: Medicare Other | Attending: Student | Admitting: Student

## 2022-07-01 ENCOUNTER — Encounter: Payer: Self-pay | Admitting: Student

## 2022-07-01 ENCOUNTER — Telehealth: Payer: Self-pay | Admitting: Student

## 2022-07-01 VITALS — BP 132/70 | HR 73 | Ht 64.0 in | Wt 186.6 lb

## 2022-07-01 DIAGNOSIS — I2510 Atherosclerotic heart disease of native coronary artery without angina pectoris: Secondary | ICD-10-CM

## 2022-07-01 DIAGNOSIS — E785 Hyperlipidemia, unspecified: Secondary | ICD-10-CM

## 2022-07-01 DIAGNOSIS — R079 Chest pain, unspecified: Secondary | ICD-10-CM | POA: Diagnosis not present

## 2022-07-01 DIAGNOSIS — I5032 Chronic diastolic (congestive) heart failure: Secondary | ICD-10-CM | POA: Diagnosis not present

## 2022-07-01 DIAGNOSIS — I4821 Permanent atrial fibrillation: Secondary | ICD-10-CM | POA: Diagnosis not present

## 2022-07-01 DIAGNOSIS — I10 Essential (primary) hypertension: Secondary | ICD-10-CM

## 2022-07-01 DIAGNOSIS — Z131 Encounter for screening for diabetes mellitus: Secondary | ICD-10-CM

## 2022-07-01 MED ORDER — NITROGLYCERIN 0.4 MG SL SUBL: SUBLINGUAL_TABLET | SUBLINGUAL | 2 refills | Status: DC

## 2022-07-01 NOTE — Telephone Encounter (Signed)
Returned call to pt and explained that he needs to be seen in the ER. Pt still wants to be seen in the office. Explained that if he has anymore chest pain that he needs to be seen in the ER. Pt voiced understanding.

## 2022-07-01 NOTE — Progress Notes (Signed)
Cardiology Office Note:    Date:  07/01/2022  ID:  Jerry Cain, DOB [birth year], MRN [id number]  History of Present Illness:    Jerry Cain is a 89 y.o. male with past medical history of CAD (s/p PTCA and stenting of mid-LAD and LCx in 2009, NST in 03/2018 showing prior infarct with no current ischemia), persistent atrial fibrillation (on Coumadin), trifasicular block, HFrEF, HTN and HLD who presents to the office today for evaluation of chest pain.   He was last examined by Dr. Sallyanne Kuster in 08/2021 and denied any recent chest pain or dyspnea on exertion at that time. Was continued on his current cardiac medications including Coreg 25 mg twice daily, Lasix 20 mg daily, Imdur 60 mg daily, Lisinopril 20 mg daily, Crestor 10 mg daily and Coumadin.  In talking with the patient, his wife and his daughter today, he reports he is usually very active and has recently been cutting down trees with a chainsaw and doing yard work without any chest pain or dyspnea on exertion. This morning, he developed a sharp pain along his left pectoral region which awoke him from sleep. He took a nitroglycerin x1 and pain resolved within 5 minutes. He reports being in his normal state since and denies any recurrent pain. No associated dyspnea, nausea or vomiting. No recent palpitations, orthopnea, PND or pitting edema.    Studies Reviewed:    EKG:  EKG is ordered today and personally reviewed. This shows rate-controlled atrial fibrillation, HR 73 with PVC's and known RBBB.   NST: 03/2018 There was no ST segment deviation noted during stress. Findings consistent with prior inferior/apical myocardial infarction without current ischemia This is an intermediate risk study. Risk based on decreased LVEF, no current myocardium at jeopardy. Correlate LVEF with echo. The left ventricular ejection fraction is mildly decreased (49%).  Echocardiogram: 03/2018 Study Conclusions   - Left ventricle: The cavity size  was normal. Wall thickness was    increased in a pattern of moderate LVH. Systolic function was    normal. The estimated ejection fraction was in the range of 60%    to 65%. Wall motion was normal; there were no regional wall    motion abnormalities. The study is not technically sufficient to    allow evaluation of LV diastolic function.  - Aortic valve: Moderately calcified annulus. Trileaflet;    moderately thickened leaflets. Valve area (VTI): 1.79 cm^2. Valve    area (Vmax): 1.79 cm^2. Valve area (Vmean): 1.76 cm^2.  - Mitral valve: Mildly calcified annulus. Mildly thickened leaflets    . There was mild regurgitation.  - Left atrium: The atrium was moderately dilated.  - Right atrium: The atrium was moderately dilated.     Physical Exam:   VS:  BP 132/70   Pulse 73   Ht 5\' 4"  (1.626 m)   Wt 186 lb 9.6 oz (84.6 kg)   SpO2 97%   BMI 32.03 kg/m    Wt Readings from Last 3 Encounters:  07/01/22 186 lb 9.6 oz (84.6 kg)  08/08/21 193 lb (87.5 kg)  03/09/21 190 lb 6.4 oz (86.4 kg)     GEN: Pleasant, elderly male appearing in no acute distress NECK: No JVD; No carotid bruits CARDIAC: Irregular irregular, no murmurs, rubs, gallops RESPIRATORY:  Clear to auscultation without rales, wheezing or rhonchi  ABDOMEN: Soft. Does not appear distended. No obvious abdominal masses. EXTREMITIES:  No pitting edema; No deformity   ASSESSMENT AND PLAN:    1. CAD/Chest  Pain - He underwent PTCA and stenting of the LAD and LCx in 2009 with most recent ischemic evaluation and a low risk NST in 2019.  - Given his recent episode of chest pain which resolved with the use of nitroglycerin, will obtain a Lexiscan Myoview for ischemic evaluation. We also reviewed possible titration of Imdur but for now he prefers to continue his current dose of 60 mg daily. This can be titrated if he has recurrent pain or based off stress test results. - Continue Imdur 60 mg daily, Crestor 10 mg daily and Coreg 25 mg  twice daily.  He is not on ASA given the need for anticoagulation.  2. HFpEF - His EF was normal at 60 to 65% by most recent imaging in 2019. He appears euvolemic on examination today and his weight has been stable on his home scales. - Continue current medical therapy with Coreg 25 mg twice daily, Lisinopril 20 mg daily and Lasix 20 mg daily. Will recheck renal function and electrolytes with upcoming labs.   3. Permanent Atrial Fibrillation - He denies any recent palpitations and his heart rate is well-controlled in the 60's to 70's during today's visit. He remains on Coreg 25 mg twice daily.  - No reports of active bleeding. He remains on Coumadin for anticoagulation and his INR was at goal when checked in 05/2022. Will recheck a CBC and CMET with upcoming labs.   4. HTN - His BP is well-controlled at 132/70 during today's visit and has also been well-controlled when checked at home. Continue current medical therapy with Coreg 25 mg twice daily, Imdur 60 mg daily and Lisinopril 20 mg daily.  5. HLD - He has not had recent labs. Will recheck an FLP on the day of his stress test. Continue Crestor 10 mg daily as he was previously intolerant to higher intensity statin therapy.  6. Screening for Type 2 DM - Will check a Hgb A1c with upcoming labs as he does not currently have a PCP.    Signed, Erma Heritage, PA-C

## 2022-07-01 NOTE — Telephone Encounter (Signed)
Pt states that he woke up this morning with chest pain. Denies nausea and sweating. O2 sat 98, BP 146/88 Hr 78. Pt did take a nitro with relief. Pt denies having chest pain at this time. Pt is requesting an appt. With a provider today or tomorrow. Pt does not want to be seen in the ER. Explained that we are unable to check labs in office. Pt still wants to be seen in office. Please advise.

## 2022-07-01 NOTE — Telephone Encounter (Signed)
  Pt c/o of Chest Pain: STAT if CP now or developed within 24 hours  1. Are you having CP right now? No   2. Are you experiencing any other symptoms (ex. SOB, nausea, vomiting, sweating)?   3. How long have you been experiencing CP? This morning   4. Is your CP continuous or coming and going? Coming and going  5. Have you taken Nitroglycerin? Yes   Pt said, when he woke up this morning he felt chest pain, he took 1 nitroglycerin and it helped the pain go away. He would like to see [place] today ?

## 2022-07-01 NOTE — Patient Instructions (Addendum)
Medication Instructions:  Your physician recommends that you continue on your current medications as directed. Please refer to the Current Medication list given to you today.   Labwork: CBC,CMET,Lipids, A1C do the day of stress test  Testing/Procedures: Your physician has requested that you have a lexiscan myoview. For further information please visit [email or web address]. Please follow instruction sheet, as given.   Follow-Up: Keep may appointment  Any Other Special Instructions Will Be Listed Below (If Applicable).  If you need a refill on your cardiac medications before your next appointment, please call your pharmacy.

## 2022-07-01 NOTE — Telephone Encounter (Signed)
   I strongly recommend Emergency Department evaluation. If he refuses, can add today. If he has more pain in the interim, he needs to go to the Emergency Department.   Signed, Erma Heritage, PA-C 07/01/2022, 9:19 AM

## 2022-07-04 ENCOUNTER — Ambulatory Visit (HOSPITAL_BASED_OUTPATIENT_CLINIC_OR_DEPARTMENT_OTHER)
Admission: RE | Admit: 2022-07-04 | Discharge: 2022-07-04 | Disposition: A | Discharge status: Home / Self Care | Payer: Medicare Other | Source: Ambulatory Visit | Attending: Student | Admitting: Student

## 2022-07-04 ENCOUNTER — Other Ambulatory Visit (HOSPITAL_COMMUNITY)
Admission: RE | Admit: 2022-07-04 | Discharge: 2022-07-04 | Disposition: A | Discharge status: Home / Self Care | Payer: Medicare Other | Source: Ambulatory Visit | Attending: Student | Admitting: Student

## 2022-07-04 ENCOUNTER — Ambulatory Visit (HOSPITAL_COMMUNITY)
Admission: RE | Admit: 2022-07-04 | Discharge: 2022-07-04 | Disposition: A | Discharge status: Home / Self Care | Payer: Medicare Other | Source: Ambulatory Visit | Attending: Cardiovascular Disease | Admitting: Cardiovascular Disease

## 2022-07-04 ENCOUNTER — Other Ambulatory Visit: Payer: Self-pay

## 2022-07-04 DIAGNOSIS — Z131 Encounter for screening for diabetes mellitus: Secondary | ICD-10-CM

## 2022-07-04 DIAGNOSIS — E785 Hyperlipidemia, unspecified: Secondary | ICD-10-CM

## 2022-07-04 DIAGNOSIS — R079 Chest pain, unspecified: Secondary | ICD-10-CM | POA: Insufficient documentation

## 2022-07-04 DIAGNOSIS — I2510 Atherosclerotic heart disease of native coronary artery without angina pectoris: Secondary | ICD-10-CM | POA: Insufficient documentation

## 2022-07-04 LAB — LIPID PANEL
Cholesterol: 99 mg/dL (ref 0–200)
HDL: 36 mg/dL — ABNORMAL LOW (ref >40)
LDL Cholesterol: 48 mg/dL (ref 0–99)
Total CHOL/HDL Ratio: 2.8 RATIO
Triglycerides: 77 mg/dL (ref <150)
VLDL: 15 mg/dL (ref 0–40)

## 2022-07-04 LAB — COMPREHENSIVE METABOLIC PANEL
ALT: 22 U/L (ref 0–44)
AST: 24 U/L (ref 15–41)
Albumin: 4 g/dL (ref 3.5–5.0)
Alkaline Phosphatase: 47 U/L (ref 38–126)
Anion gap: 7 (ref 5–15)
BUN: 21 mg/dL (ref 8–23)
CO2: 29 mmol/L (ref 22–32)
Calcium: 9.1 mg/dL (ref 8.9–10.3)
Chloride: 103 mmol/L (ref 98–111)
Creatinine, Ser: 0.95 mg/dL (ref 0.61–1.24)
GFR, Estimated: >60 mL/min (ref >60)
Glucose, Bld: 114 mg/dL — ABNORMAL HIGH (ref 70–99)
Potassium: 3.6 mmol/L (ref 3.5–5.1)
Sodium: 139 mmol/L (ref 135–145)
Total Bilirubin: 1.4 mg/dL — ABNORMAL HIGH (ref 0.3–1.2)
Total Protein: 6.8 g/dL (ref 6.5–8.1)

## 2022-07-04 LAB — CBC
HCT: 39.2 % (ref 39.0–52.0)
Hemoglobin: 12.9 g/dL — ABNORMAL LOW (ref 13.0–17.0)
MCH: 30.9 pg (ref 26.0–34.0)
MCHC: 32.9 g/dL (ref 30.0–36.0)
MCV: 94 fL (ref 80.0–100.0)
Platelets: 144 10*3/uL — ABNORMAL LOW (ref 150–400)
RBC: 4.17 MIL/uL — ABNORMAL LOW (ref 4.22–5.81)
RDW: 14.8 % (ref 11.5–15.5)
WBC: 6.5 10*3/uL (ref 4.0–10.5)
nRBC: 0 % (ref 0.0–0.2)

## 2022-07-04 LAB — NM MYOCAR MULTI W/SPECT W/WALL MOTION / EF
LV dias vol: 116 mL (ref 62–150)
LV sys vol: 50 mL
Nuc Stress EF: 57 %
Peak HR: 86 {beats}/min
RATE: 0.4
Rest HR: 73 {beats}/min
Rest Nuclear Isotope Dose: 10.3 mCi
SDS: 0
SRS: 8
SSS: 8
ST Depression (mm): 0 mm
Stress Nuclear Isotope Dose: 30.5 mCi
TID: 1.04

## 2022-07-04 LAB — HEMOGLOBIN A1C
Hgb A1c MFr Bld: 6 % — ABNORMAL HIGH (ref 4.8–5.6)
Mean Plasma Glucose: 126 mg/dL

## 2022-07-04 MED ORDER — TECHNETIUM TC 99M TETROFOSMIN IV KIT
30.5000 | PACK | Freq: Once | INTRAVENOUS | Status: AC | PRN
  Administered 2022-07-04: 30.5 via INTRAVENOUS

## 2022-07-04 MED ORDER — TECHNETIUM TC 99M TETROFOSMIN IV KIT
10.3000 | PACK | Freq: Once | INTRAVENOUS | Status: AC | PRN
  Administered 2022-07-04: 10.3 via INTRAVENOUS

## 2022-07-04 MED ORDER — SODIUM CHLORIDE FLUSH 0.9 % IV SOLN
INTRAVENOUS | Status: AC
  Administered 2022-07-04: 10 mL via INTRAVENOUS
  Filled 2022-07-04: qty 10

## 2022-07-04 MED ORDER — REGADENOSON 0.4 MG/5ML IV SOLN
INTRAVENOUS | Status: AC
  Administered 2022-07-04: 0.4 mg via INTRAVENOUS
  Filled 2022-07-04: qty 5

## 2022-07-07 ENCOUNTER — Encounter (HOSPITAL_COMMUNITY): Admission: RE | Admit: 2022-07-07 | Payer: Medicare Other | Source: Ambulatory Visit

## 2022-07-07 ENCOUNTER — Encounter (HOSPITAL_COMMUNITY): Payer: Medicare Other

## 2022-07-15 ENCOUNTER — Ambulatory Visit: Payer: Medicare Other | Attending: Cardiology | Admitting: *Deleted

## 2022-07-15 DIAGNOSIS — Z5181 Encounter for therapeutic drug level monitoring: Secondary | ICD-10-CM

## 2022-07-15 DIAGNOSIS — I4891 Unspecified atrial fibrillation: Secondary | ICD-10-CM | POA: Diagnosis not present

## 2022-07-15 LAB — POCT INR: INR: 2.3 (ref 2.0–3.0)

## 2022-07-15 NOTE — Patient Instructions (Signed)
Continue taking 1 tablet daily except 1/2 tablet on Sundays, Tuesdays and Thursdays Recheck in 6 weeks 

## 2022-08-05 ENCOUNTER — Other Ambulatory Visit: Payer: Self-pay | Admitting: Cardiovascular Disease

## 2022-08-06 ENCOUNTER — Ambulatory Visit: Payer: HMO | Attending: Student | Admitting: Student

## 2022-08-06 ENCOUNTER — Encounter: Payer: Self-pay | Admitting: Student

## 2022-08-06 VITALS — BP 116/70 | HR 82 | Ht 71.0 in | Wt 187.8 lb

## 2022-08-06 DIAGNOSIS — I4821 Permanent atrial fibrillation: Secondary | ICD-10-CM | POA: Diagnosis not present

## 2022-08-06 DIAGNOSIS — I2510 Atherosclerotic heart disease of native coronary artery without angina pectoris: Secondary | ICD-10-CM | POA: Diagnosis not present

## 2022-08-06 DIAGNOSIS — I5032 Chronic diastolic (congestive) heart failure: Secondary | ICD-10-CM | POA: Diagnosis not present

## 2022-08-06 DIAGNOSIS — E785 Hyperlipidemia, unspecified: Secondary | ICD-10-CM

## 2022-08-06 DIAGNOSIS — I10 Essential (primary) hypertension: Secondary | ICD-10-CM | POA: Diagnosis not present

## 2022-08-06 NOTE — Patient Instructions (Signed)
Medication Instructions:  Your physician recommends that you continue on your current medications as directed. Please refer to the Current Medication list given to you today.  *If you need a refill on your cardiac medications before your next appointment, please call your pharmacy*   Lab Work:  NONE  If you have labs (blood work) drawn today and your tests are completely normal, you will receive your results only by: MyChart Message (if you have MyChart) OR A paper copy in the mail If you have any lab test that is abnormal or we need to change your treatment, we will call you to review the results.   Testing/Procedures: NONE    Follow-Up: At [facility], you and your health needs are our priority.  As part of our continuing mission to provide you with exceptional heart care, we have created designated Provider Care Teams.  These Care Teams include your primary Cardiologist (physician) and Advanced Practice Providers (APPs -  Physician Assistants and Nurse Practitioners) who all work together to provide you with the care you need, when you need it.  We recommend signing up for the patient portal called "MyChart".  Sign up information is provided on this After Visit Summary.  MyChart is used to connect with patients for Virtual Visits (Telemedicine).  Patients are able to view lab/test results, encounter notes, upcoming appointments, etc.  Non-urgent messages can be sent to your provider as well.   To learn more about what you can do with MyChart, go to [email or web address].    Your next appointment:   6 month(s)  Provider:   Brittany Strader, PA-C    Other Instructions Thank you for choosing [facility]!    

## 2022-08-06 NOTE — Progress Notes (Signed)
Cardiology Office Note    Date:  08/06/2022  ID:  Jerry Cain, DOB 07/22/1931, MRN [id number] Cardiologist: Thurmon Fair, MD    History of Present Illness:    Jerry Cain is a 89 y.o. male with past medical history of CAD (s/p PTCA and stenting of mid-LAD and LCx in 2009, NST in 03/2018 showing prior infarct with no current ischemia), persistent atrial fibrillation (on Coumadin), trifasicular block, HFrEF, HTN and HLD who presents to the office today for 6-week follow-up.  He was examined by myself in 06/2022 and reported an episode of sharp chest discomfort the day of his office visit and pain had resolved with nitroglycerin. He reported being in his normal state since and denied any active pain at the time of his evaluation. Given the timeframe since his last ischemic evaluation, a follow-up Lexiscan Myoview was recommended. We also reviewed possible titration of Imdur but he preferred to continue his current dose. His NST showed probable soft tissue attenuation and normal perfusion with no evidence of ischemia or scar and was overall a low-risk study.  In talking with the patient and his wife today, he reports overall doing well from a cardiac perspective since his last office visit. He denies any recurrent episodes of chest pain. Reports his breathing has been stable with no specific orthopnea, PND or pitting edema. His main limiting factor at this time is right knee pain and he uses Biofreeze with improvement in his symptoms. He remains on Coumadin for anticoagulation with no reports of melena, hematochezia or hematuria.  Studies Reviewed:   EKG: EKG is not ordered today. EKG from 07/04/2022 is reviewed and demonstrates rate controlled atrial fibrillation, heart rate 72 with RBBB and LAFB.  Echocardiogram: 03/2018 Study Conclusions   - Left ventricle: The cavity size was normal. Wall thickness was    increased in a pattern of moderate LVH. Systolic function was     normal. The estimated ejection fraction was in the range of 60%    to 65%. Wall motion was normal; there were no regional wall    motion abnormalities. The study is not technically sufficient to    allow evaluation of LV diastolic function.  - Aortic valve: Moderately calcified annulus. Trileaflet;    moderately thickened leaflets. Valve area (VTI): 1.79 cm^2. Valve    area (Vmax): 1.79 cm^2. Valve area (Vmean): 1.76 cm^2.  - Mitral valve: Mildly calcified annulus. Mildly thickened leaflets    . There was mild regurgitation.  - Left atrium: The atrium was moderately dilated.  - Right atrium: The atrium was moderately dilated.    NST: 06/2022   Lexiscan stress showed no EKG changes to suggest ischemia.   Myoview scan showed thinning with decreased tracer counts in the basal inferior, distal inferior regions and apex. Consistent with probable soft tissue attenuation (diaphragm) and normal perfusion. No ischemia or scar   LVEF 57% with normal wall motion   Overall low risk scan   Physical Exam:   VS:  BP 116/70   Pulse 82   Ht 5\' 11"  (1.803 m)   Wt 187 lb 12.8 oz (85.2 kg)   SpO2 96%   BMI 26.19 kg/m    Wt Readings from Last 3 Encounters:  08/06/22 187 lb 12.8 oz (85.2 kg)  07/01/22 186 lb 9.6 oz (84.6 kg)  08/08/21 193 lb (87.5 kg)     GEN: Pleasant, elderly male appearing in no acute distress NECK: No JVD; No carotid bruits CARDIAC: Irregularly irregular,  no murmurs, rubs, gallops RESPIRATORY:  Clear to auscultation without rales, wheezing or rhonchi  ABDOMEN: Appears non-distended. No obvious abdominal masses. EXTREMITIES: No clubbing or cyanosis. No pitting edema.  Distal pedal pulses are 2+ bilaterally.   Assessment and Plan:   1. CAD - He previously underwent PTCA and stenting of the LAD and LCx in 2009 and recent NST in 06/2022 showed no significant ischemia as outlined above.  - He denies any recurrent anginal symptoms. - Continue current medical therapy with  Coreg 25 mg twice daily, Imdur 60 mg daily and Crestor 10 mg daily. He is not on ASA given the need for anticoagulation.  2. HFpEF - His weight has been stable on his home scales and he appears euvolemic by examination today. Continue Lasix 20 mg daily. Recent labs showed his creatinine was stable at 0.95 with electrolytes being within a normal range.  3. Permanent Atrial Fibrillation - He denies any recent palpitations and his heart rate is in the 80's today and has been in the 70's to 80's when checked at home. Continue Coreg 25 mg twice daily for rate control. - He remains on Coumadin for anticoagulation and labs in 06/2022 showed his hemoglobin was stable at 12.9 with platelets at 144 K. INR was stable at 2.3 when checked on 07/15/2022.  4. HTN - His blood pressure is well-controlled at 116/70 during today's visit and he brings with him a very detailed BP log and this has been well-controlled when checked at home. Continue current medical therapy with Coreg 25 mg twice daily, Imdur 60 mg daily and Lisinopril 20 mg daily.  5. HLD - FLP in 06/2022 showed total cholesterol 99, triglycerides 77, HDL 36 and LDL 48. Continue current medical therapy with Crestor 10 mg daily.   Signed, Ellsworth Lennox, PA-C

## 2022-08-16 ENCOUNTER — Other Ambulatory Visit: Payer: Self-pay | Admitting: Cardiovascular Disease

## 2022-08-26 ENCOUNTER — Ambulatory Visit: Payer: HMO | Attending: Cardiology | Admitting: *Deleted

## 2022-08-26 DIAGNOSIS — I4891 Unspecified atrial fibrillation: Secondary | ICD-10-CM | POA: Diagnosis not present

## 2022-08-26 DIAGNOSIS — Z5181 Encounter for therapeutic drug level monitoring: Secondary | ICD-10-CM | POA: Diagnosis not present

## 2022-08-26 LAB — POCT INR: INR: 3 (ref 2.0–3.0)

## 2022-08-26 NOTE — Patient Instructions (Signed)
Continue taking 1 tablet daily except 1/2 tablet on Sundays, Tuesdays and Thursdays Recheck in 6 weeks 

## 2022-08-28 ENCOUNTER — Other Ambulatory Visit: Payer: Self-pay | Admitting: Student

## 2022-08-29 NOTE — Telephone Encounter (Signed)
Refill request

## 2022-09-04 ENCOUNTER — Ambulatory Visit: Payer: Medicare Other | Admitting: Urology

## 2022-09-25 ENCOUNTER — Other Ambulatory Visit: Payer: Self-pay | Admitting: Cardiovascular Disease

## 2022-09-25 DIAGNOSIS — I4821 Permanent atrial fibrillation: Secondary | ICD-10-CM

## 2022-09-25 NOTE — Telephone Encounter (Signed)
Prescription refill request received for warfarin Lov:  Strader, 08/06/2022 Next INR check: 7/2 Warfarin tablet strength: 5mg 

## 2022-09-26 NOTE — Telephone Encounter (Signed)
Pt's pharmacy is requesting a refill on levothyroxine. Would Dr. Croitoru like to refill this medication? Please address 

## 2022-10-02 ENCOUNTER — Encounter: Payer: Self-pay | Admitting: Orthopaedic Surgery

## 2022-10-02 ENCOUNTER — Ambulatory Visit (INDEPENDENT_AMBULATORY_CARE_PROVIDER_SITE_OTHER): Payer: HMO | Admitting: Orthopaedic Surgery

## 2022-10-02 VITALS — Ht 71.0 in | Wt 182.0 lb

## 2022-10-02 DIAGNOSIS — I878 Other specified disorders of veins: Secondary | ICD-10-CM | POA: Diagnosis not present

## 2022-10-02 NOTE — Progress Notes (Signed)
Office Visit Note   Patient: Jerry Cain           Date of Birth: [birth year]           MRN: [id number] Visit Date: 10/02/2022              Requested by: No referring provider defined for this encounter. PCP: Patient, No Pcp Per   Assessment & Plan: Visit Diagnoses:  1. Venous stasis of lower extremity     Plan: He can get some light Ted compression for his venous stasis problem.  If he has recurrent symptoms he can continue to use ice over the lateral capsule.  Office follow-up  Follow-Up Instructions: No follow-ups on file.   Orders:  No orders of the defined types were placed in this encounter.  No orders of the defined types were placed in this encounter.     Procedures: No procedures performed   Clinical Data: No additional findings.   Subjective: Chief Complaint  Patient presents with   Left Knee - Pain    HPI 89 year old male returns had total knee arthroplasty 2015.  He caught his left foot on the table as he was getting up from sitting position and started having sharp pain over the lateral femoral condyle.  He states since he is iced it a lot is actually gotten some better its bothered him in the past at 1 point he had a cortisone injection by Dr. Cleophas Dunker last year which she states he did not think it really helped.  Patient [place] any groin pain no associated back pain.  Bilateral venous stasis discoloration dark black over both shins with left lower extremity swelling.  Has some dilated superficial veins adjacent saphenous along the left knee.  Review of Systems all systems are noncontributory to HPI.   Objective: Vital Signs: Ht 5\' 11"  (1.803 m)   Wt 182 lb (82.6 kg)   BMI 25.38 kg/m   Physical Exam Constitutional:      Appearance: He is well-developed.  HENT:     Head: Normocephalic and atraumatic.     Right Ear: External ear normal.     Left Ear: External ear normal.  Eyes:     Pupils: Pupils are equal, round, and reactive to light.   Neck:     Thyroid: No thyromegaly.     Trachea: No tracheal deviation.  Cardiovascular:     Rate and Rhythm: Normal rate.  Pulmonary:     Effort: Pulmonary effort is normal.     Breath sounds: No wheezing.  Abdominal:     General: Bowel sounds are normal.     Palpations: Abdomen is soft.  Musculoskeletal:     Cervical back: Neck supple.  Skin:    General: Skin is warm and dry.     Capillary Refill: Capillary refill takes less than 2 seconds.  Neurological:     Mental Status: He is alert and oriented to person, place, and time.  Psychiatric:        Behavior: Behavior normal.        Thought Content: Thought content normal.        Judgment: Judgment normal.     Ortho Exam good knee range of motion no effusion tenderness over the capsule adjacent lateral femoral condyle.  Iliotibial band appears normal trochanteric bursa is normal with palpation negative logroll hips.  Bilateral lower extremity edema much worse on the left than right with severe discoloration over the shin dark brown.  Specialty Comments:  No specialty comments available.  Imaging: No results found.   PMFS History: Patient Active Problem List   Diagnosis Date Noted   Venous stasis of lower extremity 10/02/2022   Pain in right wrist 09/23/2021   Bilateral primary osteoarthritis of knee 05/04/2019   CHF (congestive heart failure) (HCC) 05/02/2018   Acute respiratory failure with hypoxia (HCC) 05/01/2018   Reactive airway disease 05/01/2018   AKI (acute kidney injury) (HCC) 05/01/2018   Acute exacerbation of CHF (congestive heart failure) (HCC) 04/30/2018   Acute on chronic diastolic heart failure (HCC) 08/16/2016   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   Arthritis of knee, left 06/16/2013   S/P total knee replacement using cement 06/14/2013   Osteoarthritis of left knee 03/21/2013   BPH (benign prostatic hyperplasia)    GERD (gastroesophageal reflux disease)    CAD (coronary artery disease)    Fever  03/20/2013   Altered mental state 03/20/2013   Postoperative fever 03/20/2013   PNA (pneumonia) 03/20/2013   Preop cardiovascular exam 11/24/2012   Peroneal tendonitis 10/05/2012   Arthritis of foot, degenerative 10/05/2012   HTN (hypertension) 09/25/2012   Hypercholesterolemia 09/25/2012   Atrial fibrillation (HCC) 06/15/2012   Long term current use of anticoagulant therapy 06/15/2012   Rotator cuff strain 02/04/2012   Shoulder pain 02/04/2012   History of colonic polyps 01/29/2011   High risk medication use 01/29/2011   Past Medical History:  Diagnosis Date   Adenomatous colon polyp 2001   Anxiety    Atrial fibrillation (HCC)    takes Coumadin daily   BPH (benign prostatic hyperplasia)    takes Proscar daily   CAD (coronary artery disease) 2009   a. s/p PTCA and stenting of mid-LAD and LCx in 2009 b. NST in 03/2018 showing prior infarct with no current ischemia   Carpal tunnel syndrome    right   Complication of anesthesia    hard to wake up   Diverticulosis 2007   tcs by Dr. Karilyn Cota   GERD (gastroesophageal reflux disease)    Gout    Hypercholesterolemia    takes Atorvastatin daily   Hypertension    takes Imdur,Coreg,and Lisinopril daily   Hypothyroidism    takes SYnthroid daily   Joint pain    Joint swelling    Nocturia    Numbness    right hand pointer and middle finger   Osteoarthritis    left knee   Pneumonia    many,many yrs ago   Tubular adenoma 2001    Family History  Problem Relation Age of Onset   Pneumonia Father    Heart disease Brother        open heart surgery at age 46   Colon cancer Neg Hx     Past Surgical History:  Procedure Laterality Date   Quintella Reichert OSTEOTOMY Right 03/17/2013   Procedure: Quintella Reichert OSTEOTOMY;  Surgeon: Dallas Schimke, DPM;  Location: [facility];  Service: Orthopedics;  Laterality: Right;   BUNIONECTOMY Right 03/17/2013   Procedure: BUNIONECTOMY, Arthroplasty 2nd toe right foot;  Surgeon: Dallas Schimke, DPM;   Location: [facility];  Service: Orthopedics;  Laterality: Right;   CARDIAC CATHETERIZATION  06/11/2007   PTCA X2 in 06/2007:  2 overlapping Cypher stents to LAD (2.5x13 and 3.0x23)   CARDIAC CATHETERIZATION  06/21/2007   Cypher stent 2.5 x 13  to OM branch   CARDIOVERSION  12/12/2011   Procedure: CARDIOVERSION;  Surgeon: Thurmon Fair, MD;  Location: [facility];  Service: Cardiovascular;  Laterality: N/A;   CATARACT  EXTRACTION     bilateral   COLONOSCOPY  2007   Dr. Karilyn Cota- L sided diverticulosis. Next TCS 2012 due to h/o tubular adenoma.   COLONOSCOPY  02/26/2011   Procedure: COLONOSCOPY;  Surgeon: Corbin Ade, MD;  Location: [facility];  Service: Endoscopy;  Laterality: N/A;  11:05   CORONARY ANGIOPLASTY     x 3    FLEXOR TENOTOMY  Left 03/17/2013   Procedure: PERCUTANEOUS FLEXOR TENOTOMY 3RD TOE LEFT FOOT;  Surgeon: Dallas Schimke, DPM;  Location: [facility];  Service: Orthopedics;  Laterality: Left;   HERNIA REPAIR  2009   left inguinal   rot Left    TEE WITHOUT CARDIOVERSION  12/12/2011   Procedure: TRANSESOPHAGEAL ECHOCARDIOGRAM (TEE);  Surgeon: Thurmon Fair, MD;  Location: [facility] END[facility];  Service: Cardiovascular;  Laterality: N/A;   successful/sinus rhythm   TOTAL KNEE ARTHROPLASTY Left 06/14/2013   Procedure: TOTAL KNEE ARTHROPLASTY;  Surgeon: Valeria Batman, MD;  Location: [facility] OR;  Service: Orthopedics;  Laterality: Left;   Social History   Occupational History   Occupation: retired  Tobacco Use   Smoking status: Never   Smokeless tobacco: Never  Vaping Use   Vaping Use: Never used  Substance and Sexual Activity   Alcohol use: No   Drug use: No   Sexual activity: Not on file

## 2022-10-07 ENCOUNTER — Ambulatory Visit: Payer: HMO | Attending: Cardiology | Admitting: *Deleted

## 2022-10-07 DIAGNOSIS — Z5181 Encounter for therapeutic drug level monitoring: Secondary | ICD-10-CM | POA: Diagnosis not present

## 2022-10-07 DIAGNOSIS — I4891 Unspecified atrial fibrillation: Secondary | ICD-10-CM | POA: Diagnosis not present

## 2022-10-07 LAB — POCT INR: INR: 2.7 (ref 2.0–3.0)

## 2022-10-07 NOTE — Patient Instructions (Signed)
Continue taking 1 tablet daily except 1/2 tablet on Sundays, Tuesdays and Thursdays Recheck in 6 weeks 

## 2022-10-30 ENCOUNTER — Ambulatory Visit: Payer: Medicare Other | Admitting: Urology

## 2022-11-11 ENCOUNTER — Other Ambulatory Visit: Payer: Self-pay | Admitting: Cardiovascular Disease

## 2022-11-18 ENCOUNTER — Ambulatory Visit: Payer: HMO | Attending: Cardiology | Admitting: *Deleted

## 2022-11-18 DIAGNOSIS — I4891 Unspecified atrial fibrillation: Secondary | ICD-10-CM | POA: Diagnosis not present

## 2022-11-18 DIAGNOSIS — Z5181 Encounter for therapeutic drug level monitoring: Secondary | ICD-10-CM

## 2022-11-18 LAB — POCT INR: INR: 3.7 — AB (ref 2.0–3.0)

## 2022-11-18 NOTE — Patient Instructions (Signed)
Hold warfarin tomorrow then resume 1 tablet daily except 1/2 tablet on Sundays, Tuesdays and Thursdays.  Increase greens Recheck in 3 weeks

## 2022-11-20 ENCOUNTER — Ambulatory Visit: Payer: Medicare Other | Admitting: Urology

## 2022-11-21 ENCOUNTER — Other Ambulatory Visit: Payer: Self-pay | Admitting: Urology

## 2022-11-21 ENCOUNTER — Other Ambulatory Visit: Payer: Self-pay | Admitting: Cardiovascular Disease

## 2022-11-21 DIAGNOSIS — N400 Enlarged prostate without lower urinary tract symptoms: Secondary | ICD-10-CM

## 2022-12-06 ENCOUNTER — Other Ambulatory Visit: Payer: Self-pay | Admitting: Cardiovascular Disease

## 2022-12-09 ENCOUNTER — Ambulatory Visit: Payer: HMO | Attending: Cardiology | Admitting: *Deleted

## 2022-12-09 DIAGNOSIS — Z5181 Encounter for therapeutic drug level monitoring: Secondary | ICD-10-CM

## 2022-12-09 DIAGNOSIS — I4891 Unspecified atrial fibrillation: Secondary | ICD-10-CM

## 2022-12-09 LAB — POCT INR: INR: 3.6 — AB (ref 2.0–3.0)

## 2022-12-09 NOTE — Patient Instructions (Signed)
Hold warfarin tomorrow then decrease warfarin to 1/2 tablet daily except 1 tablet on Mondays, Wednesdays and Fridays.  Continue greens Recheck in 3 weeks

## 2022-12-12 ENCOUNTER — Other Ambulatory Visit: Payer: Self-pay | Admitting: Cardiovascular Disease

## 2022-12-12 DIAGNOSIS — I4821 Permanent atrial fibrillation: Secondary | ICD-10-CM

## 2022-12-12 NOTE — Telephone Encounter (Signed)
Warfarin 5mg  refill Afib Last INR 12/09/22 Last OV 08/06/22

## 2022-12-17 ENCOUNTER — Ambulatory Visit: Payer: Self-pay | Admitting: Family Medicine

## 2022-12-27 ENCOUNTER — Other Ambulatory Visit: Payer: Self-pay | Admitting: Cardiovascular Disease

## 2022-12-30 ENCOUNTER — Ambulatory Visit: Payer: HMO | Attending: Cardiology | Admitting: *Deleted

## 2022-12-30 DIAGNOSIS — Z5181 Encounter for therapeutic drug level monitoring: Secondary | ICD-10-CM

## 2022-12-30 DIAGNOSIS — I4891 Unspecified atrial fibrillation: Secondary | ICD-10-CM

## 2022-12-30 LAB — POCT INR: INR: 2.6 (ref 2.0–3.0)

## 2022-12-30 NOTE — Patient Instructions (Signed)
Continue warfarin 1/2 tablet daily except 1 tablet on Mondays, Wednesdays and Fridays Continue greens Recheck in 4 weeks

## 2023-01-16 ENCOUNTER — Other Ambulatory Visit: Payer: Self-pay | Admitting: Cardiovascular Disease

## 2023-01-27 ENCOUNTER — Ambulatory Visit: Payer: HMO | Attending: Cardiology

## 2023-01-29 ENCOUNTER — Ambulatory Visit: Payer: HMO | Attending: Internal Medicine | Admitting: *Deleted

## 2023-01-29 DIAGNOSIS — I4891 Unspecified atrial fibrillation: Secondary | ICD-10-CM

## 2023-01-29 DIAGNOSIS — Z5181 Encounter for therapeutic drug level monitoring: Secondary | ICD-10-CM

## 2023-01-29 LAB — POCT INR: INR: 2 (ref 2.0–3.0)

## 2023-01-29 NOTE — Patient Instructions (Signed)
Continue warfarin 1/2 tablet daily except 1 tablet on Mondays, Wednesdays and Fridays Continue greens Recheck in 4 weeks

## 2023-02-03 ENCOUNTER — Encounter: Payer: Self-pay | Admitting: Student

## 2023-02-03 ENCOUNTER — Ambulatory Visit: Payer: HMO | Attending: Student | Admitting: Student

## 2023-02-03 VITALS — BP 118/64 | HR 76 | Ht 71.0 in | Wt 189.2 lb

## 2023-02-03 DIAGNOSIS — I4821 Permanent atrial fibrillation: Secondary | ICD-10-CM

## 2023-02-03 DIAGNOSIS — I5032 Chronic diastolic (congestive) heart failure: Secondary | ICD-10-CM | POA: Diagnosis not present

## 2023-02-03 DIAGNOSIS — I10 Essential (primary) hypertension: Secondary | ICD-10-CM

## 2023-02-03 DIAGNOSIS — E785 Hyperlipidemia, unspecified: Secondary | ICD-10-CM

## 2023-02-03 DIAGNOSIS — I2510 Atherosclerotic heart disease of native coronary artery without angina pectoris: Secondary | ICD-10-CM | POA: Diagnosis not present

## 2023-02-03 MED ORDER — LISINOPRIL 20 MG PO TABS: 20.0000 mg | ORAL_TABLET | Freq: Every day | ORAL | 3 refills | Status: DC

## 2023-02-03 MED ORDER — CARVEDILOL 25 MG PO TABS: 25.0000 mg | ORAL_TABLET | Freq: Two times a day (BID) | ORAL | 3 refills | Status: DC

## 2023-02-03 MED ORDER — ISOSORBIDE MONONITRATE ER 60 MG PO TB24: 60.0000 mg | ORAL_TABLET | Freq: Every day | ORAL | 3 refills | Status: DC

## 2023-02-03 MED ORDER — ROSUVASTATIN CALCIUM 10 MG PO TABS: 10.0000 mg | ORAL_TABLET | Freq: Every day | ORAL | 3 refills | Status: DC

## 2023-02-03 NOTE — Progress Notes (Signed)
Cardiology Office Note    Date:  02/03/2023  ID:  Jerry Cain, DOB [birth year], MRN [id number] Cardiologist: Thurmon Fair, MD    History of Present Illness:    Jerry Cain is a 89 y.o. male with past medical history of CAD (s/p PTCA and stenting of mid-LAD and LCx in 2009, NST in 03/2018 showing prior infarct with no current ischemia, NST in 06/2022 showing no ischemia and a low-risk study), persistent atrial fibrillation (on Coumadin), trifasicular block, HFrEF, HTN and HLD who presents to the office today for 68-month follow-up.  He was examined by myself in 08/2022 and reported overall doing well and denied any recurrent chest pain following his recent [facility] which was overall reassuring at that time. He was continued on his current medical therapy with Coreg 25 mg twice daily, Imdur 60 mg daily, Crestor 10 mg daily, Lasix 20 mg daily, Lisinopril 20 mg daily and Coumadin.  In talking with the patient and his wife today, he reports his activity has been limited since undergoing callus removal by Podiatry earlier this month. He has been in a boot for this and is hopeful it can be removed within the next week. He denies any recent chest pain or dyspnea on exertion. No specific palpitations, orthopnea, PND or pitting edema. He reports feeling cold all of the time and is on Coumadin for anticoagulation but denies any melena, hematochezia or hematuria.  Studies Reviewed:   EKG: EKG is not ordered today. EKG from 07/04/2022 is reviewed and shows rate-controlled atrial fibrillation, heart rate 72 with RBBB and LAFB.  Echocardiogram: 03/2018 Study Conclusions   - Left ventricle: The cavity size was normal. Wall thickness was    increased in a pattern of moderate LVH. Systolic function was    normal. The estimated ejection fraction was in the range of 60%    to 65%. Wall motion was normal; there were no regional wall    motion abnormalities. The study is not technically  sufficient to    allow evaluation of LV diastolic function.  - Aortic valve: Moderately calcified annulus. Trileaflet;    moderately thickened leaflets. Valve area (VTI): 1.79 cm^2. Valve    area (Vmax): 1.79 cm^2. Valve area (Vmean): 1.76 cm^2.  - Mitral valve: Mildly calcified annulus. Mildly thickened leaflets    . There was mild regurgitation.  - Left atrium: The atrium was moderately dilated.  - Right atrium: The atrium was moderately dilated.   NST: 06/2022   Lexiscan stress showed no EKG changes to suggest ischemia.   Myoview scan showed thinning with decreased tracer counts in the basal inferior, distal inferior regions and apex. Consistent with probable soft tissue attenuation (diaphragm) and normal perfusion. No ischemia or scar   LVEF 57% with normal wall motion   Overall low risk scan  Risk Assessment/Calculations:    CHA2DS2-VASc Score = 4   This indicates a 4.8% annual risk of stroke. The patient's score is based upon: CHF History: 0 HTN History: 1 Diabetes History: 0 Stroke History: 0 Vascular Disease History: 1 Age Score: 2 Gender Score: 0    Physical Exam:   VS:  BP 118/64   Pulse 76   Ht 5\' 11"  (1.803 m)   Wt 189 lb 3.2 oz (85.8 kg)   SpO2 97%   BMI 26.39 kg/m    Wt Readings from Last 3 Encounters:  02/03/23 189 lb 3.2 oz (85.8 kg)  10/02/22 182 lb (82.6 kg)  08/06/22 187 lb 12.8  oz (85.2 kg)     GEN: Pleasant, elderly male appearing in no acute distress NECK: No JVD; No carotid bruits CARDIAC: Irregularly irregular; No rubs or gallops.  RESPIRATORY:  Clear to auscultation without rales, wheezing or rhonchi  ABDOMEN: Appears non-distended. No obvious abdominal masses. EXTREMITIES: No clubbing or cyanosis. Trace edema along LLE, no edema along RLE.  Distal pedal pulses are 2+ bilaterally.   Assessment and Plan:   1. CAD - He underwent stenting of the LAD and LCx in 2009 and most recent ischemic evaluation was a Lexiscan Myoview in 06/2022  which showed no evidence of significant ischemia and was a low-risk study.  - He denies any recent anginal symptoms. - Continue current medical therapy with Coreg 25 mg twice daily, Imdur 60 mg daily and Crestor 10 mg daily. He is not on ASA given the need for anticoagulation.  2. Permanent Atrial Fibrillation - He denies any recent palpitations and heart rate is well-controlled in the 70's during today's visit. Continue Coreg 25 mg twice daily for rate-control. He is aware to call us if he has bradycardia with heart rate in the 50's or lower as he does have underlying conduction disease. - No reports of active bleeding. He remains on Coumadin for anticoagulation. INR was at 2.0 when checked on 01/29/2023.  3. Chronic HFpEF - He has trace edema along his leg and has been in a boot per podiatry but denies any swelling along his right leg or worsening respiratory issues. Continue with Lasix 20 mg daily. Labs in 06/2022 showed his creatinine was stable at 0.95. If not checked by his PCP in the interim, would recheck at his next office visit (was planning to establish with [facility]).   4. HTN - BP is well-controlled at 118/64 during today's visit and he brings with him today a very detailed blood pressure log from home and readings have been well-controlled there as well. Continue current medical therapy with Coreg 25 mg twice daily, Imdur 60 mg daily and Lisinopril 20 mg daily.  5. HLD - LDL was at 48 in 06/2022. Continue current medical therapy.   Signed, Ellsworth Lennox, PA-C

## 2023-02-03 NOTE — Patient Instructions (Addendum)
Medication Instructions:  Continue current regimen.   Follow-Up: Your physician recommends that you schedule a follow-up appointment in: 6 months with Randall An, PA or Dr. Royann Shivers  Any Other Special Instructions Will Be Listed Below (If Applicable).  If you need a refill on your cardiac medications before your next appointment, please call your pharmacy.

## 2023-02-20 ENCOUNTER — Other Ambulatory Visit: Payer: Self-pay | Admitting: Urology

## 2023-02-20 DIAGNOSIS — N400 Enlarged prostate without lower urinary tract symptoms: Secondary | ICD-10-CM

## 2023-02-25 ENCOUNTER — Ambulatory Visit: Payer: HMO | Attending: Cardiology

## 2023-02-25 DIAGNOSIS — I4891 Unspecified atrial fibrillation: Secondary | ICD-10-CM | POA: Diagnosis not present

## 2023-02-25 DIAGNOSIS — Z7901 Long term (current) use of anticoagulants: Secondary | ICD-10-CM | POA: Diagnosis not present

## 2023-02-25 DIAGNOSIS — Z5181 Encounter for therapeutic drug level monitoring: Secondary | ICD-10-CM | POA: Diagnosis not present

## 2023-02-25 LAB — POCT INR: INR: 2 (ref 2.0–3.0)

## 2023-02-25 NOTE — Patient Instructions (Signed)
Description   Continue warfarin 1/2 tablet daily except 1 tablet on Mondays, Wednesdays and Fridays.  Continue greens Recheck in 4 weeks

## 2023-02-26 ENCOUNTER — Encounter: Payer: Self-pay | Admitting: Urology

## 2023-02-26 ENCOUNTER — Ambulatory Visit: Payer: HMO | Admitting: Urology

## 2023-02-26 VITALS — BP 154/90 | HR 79 | Ht 71.0 in | Wt 189.2 lb

## 2023-02-26 DIAGNOSIS — R339 Retention of urine, unspecified: Secondary | ICD-10-CM | POA: Diagnosis not present

## 2023-02-26 DIAGNOSIS — N3941 Urge incontinence: Secondary | ICD-10-CM | POA: Diagnosis not present

## 2023-02-26 DIAGNOSIS — R3915 Urgency of urination: Secondary | ICD-10-CM

## 2023-02-26 DIAGNOSIS — R351 Nocturia: Secondary | ICD-10-CM | POA: Diagnosis not present

## 2023-02-26 DIAGNOSIS — N400 Enlarged prostate without lower urinary tract symptoms: Secondary | ICD-10-CM

## 2023-02-26 DIAGNOSIS — N401 Enlarged prostate with lower urinary tract symptoms: Secondary | ICD-10-CM

## 2023-02-26 DIAGNOSIS — N138 Other obstructive and reflux uropathy: Secondary | ICD-10-CM

## 2023-02-26 LAB — URINALYSIS, ROUTINE W REFLEX MICROSCOPIC
Bilirubin, UA: NEGATIVE
Glucose, UA: NEGATIVE
Ketones, UA: NEGATIVE
Leukocytes,UA: NEGATIVE
Nitrite, UA: NEGATIVE
Protein,UA: NEGATIVE
RBC, UA: NEGATIVE
Specific Gravity, UA: 1.01 (ref 1.005–1.030)
Urobilinogen, Ur: 0.2 mg/dL (ref 0.2–1.0)
pH, UA: 6 (ref 5.0–7.5)

## 2023-02-26 LAB — BLADDER SCAN AMB NON-IMAGING: Scan Result: 273

## 2023-02-26 MED ORDER — TAMSULOSIN HCL 0.4 MG PO CAPS: 0.4000 mg | ORAL_CAPSULE | Freq: Every day | ORAL | 11 refills | Status: DC

## 2023-02-26 NOTE — Progress Notes (Signed)
post void residual=273

## 2023-02-26 NOTE — Progress Notes (Signed)
Subjective:  1. Benign prostatic hyperplasia, unspecified whether lower urinary tract symptoms present   2. Incomplete bladder emptying   3. Nocturia   4. Urgency of urination   5. Urge incontinence      Jerry Cain is a 89 year-old male established patient who is here for follow up regarding further evaluation of BPH and lower urinary tract symptoms.  The patient complains of lower urinary tract symptom(s) that include nocturia and sense of incomplete emptying. The patient states his most bothersome symptom(s) are the following: nocturia. His symptoms have been stable over the last year.   07/26/20: Jerry Cain returns today in f/u. He has a several year history of BPH with BOO with an elevated PSA. He remains on finasteride. He remains off of alpha blockers which caused side effects. He is on prn furosemide and voids well on that. He has variable nocturia 3-5x but feels it is worse than last year. His IPSS is 9. The urgency is only aggravated by the furosemide. His PSA was 1.1 on the finasteride two years ago and we decided further testing was not indicated. He has no hesitancy and a good stream. He feels like he is emptying well most of the time. He has no hematuria or dysuria. He has a history of  a negative biopsy remotely for the elevated PSA. He has no associated signs or symptoms.   10/18/20: Jerry Cain returns for his history of BPH with BOO.  He remains on finasteride.   His PVR is .  His UA is clear. IPSS is 6.  He has nocturia x 2. He was given silodosin 4mg  at his last visit but he didn't tolerate it and stopped it.   04/25/21: Jerry Cain returns today in f/u.  He remains on finasteride.  He a 6 month history of increased urgency and UUI if he eats too much sodium during the day.  His family had COVID in December.  They all recovered but his wife had some hearing issues but that has resolved.  He has no dysuria or hematuria.  His PVR is stable at and his IPSS  is 22.   10/24/21: Jerry Cain returns for his history of BPH with BOO.  He remains on finasteride.  HIs IPSS is 13.   He has no incontinence.  He has had no hematuria or dysuria.  His PVR is 79ml.   03/06/22: Jerry Cain returns today in f/u for his history of BPH with BOO on finasteride.  His PVR is .  He has some AM frequency q30-86min after his morning lasix and that will settle by mid day.  He has urgency when he stands and can have some mild UUI.  He has nocturia x 3.  His IPSS is 16.   02/26/23: Jerry Cain returns today in f/u   He has BPH with BOO and remains on finasteride.   He has some increase urgency with UUI on occasion.  His PVR is up to .  His IPSS is 17 with nocturia x 3 and urgency.   His UA is clear.   He is on an antibiotic for his foot.    IPSS     Row Name 02/26/23 1000         International Prostate Symptom Score   How often have you had the sensation of not emptying your bladder? More than half the time     How often have you had to urinate less than every two hours? More than  half the time     How often have you found you stopped and started again several times when you urinated? Not at All     How often have you found it difficult to postpone urination? Almost always     How often have you had a weak urinary stream? Less than 1 in 5 times     How often have you had to strain to start urination? Not at All     How many times did you typically get up at night to urinate? 3 Times     Total IPSS Score 17       Quality of Life due to urinary symptoms   If you were to spend the rest of your life with your urinary condition just the way it is now how would you feel about that? Mostly Satisfied                  ROS:  ROS:  A complete review of systems was performed.  All systems are negative except for pertinent findings as noted.   Review of Systems  Musculoskeletal:  Positive for back pain and joint pain.  Endo/Heme/Allergies:  Bruises/bleeds  easily.  Psychiatric/Behavioral:  Positive for memory loss.     Allergies  Allergen Reactions   Carbapenems Other (See Comments)    Altered mental status   Cephalosporins     Unknown reaction   Nsaids Other (See Comments)    On blood thinner    Penicillins Swelling and Other (See Comments)    Did it involve swelling of the face/tongue/throat, SOB, or low BP? Unknown-possible swelling Did it involve sudden or severe rash/hives, skin peeling, or any reaction on the inside of your mouth or nose? Unknown Did you need to seek medical attention at a hospital or doctor's office? Unknown When did it last happen? Over 10 years (possible swelling)      If all above answers are "NO", may proceed with cephalosporin use.     Outpatient Encounter Medications as of 02/26/2023  Medication Sig   albuterol (PROVENTIL) (2.5 MG/3ML) 0.083% nebulizer solution USE 1 VIAL IN NEBULIZER EVERY 6 HOURS AS NEEDED.   Calcium Carbonate-Vitamin D 600-200 MG-UNIT TABS Take 1 tablet by mouth 2 (two) times daily.   carvedilol (COREG) 25 MG tablet Take 1 tablet (25 mg total) by mouth 2 (two) times daily with a meal.   finasteride (PROSCAR) 5 MG tablet Take 1 tablet (5 mg total) by mouth daily.   furosemide (LASIX) 20 MG tablet TAKE (1) TABLET BY MOUTH ONCE DAILY.   Glucosamine 750 MG TABS Take 750 mg by mouth 2 (two) times daily.   isosorbide mononitrate (IMDUR) 60 MG 24 hr tablet Take 1 tablet (60 mg total) by mouth daily.   levothyroxine (SYNTHROID) 150 MCG tablet TAKE 1 TABLET BY MOUTH DAILY BEFORE BREAKFAST.   lisinopril (ZESTRIL) 20 MG tablet Take 1 tablet (20 mg total) by mouth daily.   Multiple Vitamin (MULTIVITAMIN) capsule Take 1 capsule by mouth every morning.   Nebulizer MISC 1 Units by Does not apply route daily.   nitroGLYCERIN (NITROSTAT) 0.4 MG SL tablet Place 0.4 mg under the tongue every 5 (five) minutes x 3 doses as needed for chest pain (If no relief after 2nd dose, proceed to ED or call 911).    rosuvastatin (CRESTOR) 10 MG tablet Take 1 tablet (10 mg total) by mouth daily.   tamsulosin (FLOMAX) 0.4 MG CAPS capsule Take 1 capsule (0.4 mg total)  by mouth daily.   traMADol (ULTRAM) 50 MG tablet Take 1 tablet (50 mg total) by mouth every 6 (six) hours as needed.   warfarin (COUMADIN) 5 MG tablet TAKE (1/2) TO 1 TABLET BY MOUTH ONCE DAILY OR AS DIRECTED BY COUMADIN CLINIC.   No facility-administered encounter medications on file as of 02/26/2023.    Past Medical History:  Diagnosis Date   Adenomatous colon polyp 2001   Anxiety    Atrial fibrillation (HCC)    takes Coumadin daily   BPH (benign prostatic hyperplasia)    takes Proscar daily   CAD (coronary artery disease) 2009   a. s/p PTCA and stenting of mid-LAD and LCx in 2009 b. NST in 03/2018 showing prior infarct with no current ischemia   Carpal tunnel syndrome    right   Complication of anesthesia    hard to wake up   Diverticulosis 2007   tcs by Dr. Karilyn Cota   GERD (gastroesophageal reflux disease)    Gout    Hypercholesterolemia    takes Atorvastatin daily   Hypertension    takes Imdur,Coreg,and Lisinopril daily   Hypothyroidism    takes SYnthroid daily   Joint pain    Joint swelling    Nocturia    Numbness    right hand pointer and middle finger   Osteoarthritis    left knee   Pneumonia    many,many yrs ago   Tubular adenoma 2001    Past Surgical History:  Procedure Laterality Date   Quintella Reichert OSTEOTOMY Right 03/17/2013   Procedure: Quintella Reichert OSTEOTOMY;  Surgeon: Dallas Schimke, DPM;  Location: [facility];  Service: Orthopedics;  Laterality: Right;   BUNIONECTOMY Right 03/17/2013   Procedure: BUNIONECTOMY, Arthroplasty 2nd toe right foot;  Surgeon: Dallas Schimke, DPM;  Location: [facility];  Service: Orthopedics;  Laterality: Right;   CARDIAC CATHETERIZATION  06/11/2007   PTCA X2 in 06/2007:  2 overlapping Cypher stents to LAD (2.5x13 and 3.0x23)   CARDIAC CATHETERIZATION  06/21/2007   Cypher stent 2.5  x 13  to OM branch   CARDIOVERSION  12/12/2011   Procedure: CARDIOVERSION;  Surgeon: Thurmon Fair, MD;  Location: [facility];  Service: Cardiovascular;  Laterality: N/A;   CATARACT EXTRACTION     bilateral   COLONOSCOPY  2007   Dr. Karilyn Cota- L sided diverticulosis. Next TCS 2012 due to h/o tubular adenoma.   COLONOSCOPY  02/26/2011   Procedure: COLONOSCOPY;  Surgeon: Corbin Ade, MD;  Location: [facility];  Service: Endoscopy;  Laterality: N/A;  11:05   CORONARY ANGIOPLASTY     x 3    FLEXOR TENOTOMY  Left 03/17/2013   Procedure: PERCUTANEOUS FLEXOR TENOTOMY 3RD TOE LEFT FOOT;  Surgeon: Dallas Schimke, DPM;  Location: [facility];  Service: Orthopedics;  Laterality: Left;   HERNIA REPAIR  2009   left inguinal   rot Left    TEE WITHOUT CARDIOVERSION  12/12/2011   Procedure: TRANSESOPHAGEAL ECHOCARDIOGRAM (TEE);  Surgeon: Thurmon Fair, MD;  Location[facility] END[facility];  Service: Cardiovascular;  Laterality: N/A;   successful/sinus rhythm   TOTAL KNEE ARTHROPLASTY Left 06/14/2013   Procedure: TOTAL KNEE ARTHROPLASTY;  Surgeon: Valeria Batman, MD;  Location: [facility] OR;  Service: Orthopedics;  Laterality: Left;    Social History   Socioeconomic History   Marital status: Married    Spouse name: Not on file   Number of children: 4   Years of education: Not on file   Highest education level: Not on file  Occupational History   Occupation: retired  Tobacco Use   Smoking status: Never   Smokeless tobacco: Never  Vaping Use   Vaping status: Never Used  Substance and Sexual Activity   Alcohol use: No   Drug use: No   Sexual activity: Not on file  Other Topics Concern   Not on file  Social History Narrative   Not on file   Social Determinants of Health   Financial Resource Strain: Not on file  Food Insecurity: Not on file  Transportation Needs: Not on file  Physical Activity: Not on file  Stress: Not on file  Social Connections: Not on file  Intimate Partner Violence: Not  on file    Family History  Problem Relation Age of Onset   Pneumonia Father    Heart disease Brother        open heart surgery at age 46   Colon cancer Neg Hx        Objective: Vitals:   02/26/23 1047  BP: (!) 154/90  Pulse: 79      Physical Exam Vitals reviewed.  Constitutional:      Appearance: Normal appearance.  Neurological:     Mental Status: He is alert.     Lab Results:  Results for orders placed or performed in visit on 02/26/23 (from the past 24 hour(s))  Urinalysis, Routine w reflex microscopic     Status: None   Collection Time: 02/26/23 10:53 AM  Result Value Ref Range   Specific Gravity, UA 1.010 1.005 - 1.030   pH, UA 6.0 5.0 - 7.5   Color, UA Yellow Yellow   Appearance Ur Clear Clear   Leukocytes,UA Negative Negative   Protein,UA Negative Negative/Trace   Glucose, UA Negative Negative   Ketones, UA Negative Negative   RBC, UA Negative Negative   Bilirubin, UA Negative Negative   Urobilinogen, Ur 0.2 0.2 - 1.0 mg/dL   Nitrite, UA Negative Negative   Microscopic Examination Comment    Narrative   Performed at:  [address] [facility] [address], [place], [place]  [id number] Lab Director: Chinita Pester MT, Phone:  [phone]       BMET No results for input(s): "NA", "K", "CL", "CO2", "GLUCOSE", "BUN", "CREATININE", "CALCIUM" in the last 72 hours. PSA No results found for: "PSA" No results found for: "TESTOSTERONE"  UA is clear.  Results for orders placed or performed in visit on 02/26/23 (from the past 24 hour(s))  Urinalysis, Routine w reflex microscopic     Status: None   Collection Time: 02/26/23 10:53 AM  Result Value Ref Range   Specific Gravity, UA 1.010 1.005 - 1.030   pH, UA 6.0 5.0 - 7.5   Color, UA Yellow Yellow   Appearance Ur Clear Clear   Leukocytes,UA Negative Negative   Protein,UA Negative Negative/Trace   Glucose, UA Negative Negative   Ketones, UA Negative Negative   RBC, UA Negative Negative    Bilirubin, UA Negative Negative   Urobilinogen, Ur 0.2 0.2 - 1.0 mg/dL   Nitrite, UA Negative Negative   Microscopic Examination Comment    Narrative   Performed at:  [address] - [facility] [address] [place], [place]  [id number] Lab Director: Chinita Pester MT, Phone:  [phone]        Studies/Results: No results found.  PVR is  Assessment & Plan: BPH with BOO with moderate LUTS.  He has some increased UUI and his PVR is up.  He will continue the  finasteride which was filled on 02/20/23 and I will start him on tamsulosin.   Side effects reviewed.  He will return in 4-6 weeks for a PVR and then in 6 months.     Meds ordered this encounter  Medications   tamsulosin (FLOMAX) 0.4 MG CAPS capsule    Sig: Take 1 capsule (0.4 mg total) by mouth daily.    Dispense:  30 capsule    Refill:  11      Orders Placed This Encounter  Procedures   Urinalysis, Routine w reflex microscopic   BLADDER SCAN AMB NON-IMAGING      Return in about 6 months (around 08/26/2023) for return to see Jerry Cain in 4-6 weeks for a PVR and then me in 6 months with a PVR. Marland Kitchen   CC: Patient, No Pcp Per      Bjorn Pippin 02/27/2023 [name], male   DOB: [birth year], 89 y.o.   MRN: [id number] [name], male   DOB: [birth year], 89 y.o.   MRN: [id number]

## 2023-03-25 ENCOUNTER — Ambulatory Visit: Payer: HMO | Attending: Cardiology | Admitting: *Deleted

## 2023-03-25 DIAGNOSIS — Z5181 Encounter for therapeutic drug level monitoring: Secondary | ICD-10-CM | POA: Diagnosis not present

## 2023-03-25 DIAGNOSIS — I4891 Unspecified atrial fibrillation: Secondary | ICD-10-CM | POA: Diagnosis not present

## 2023-03-25 LAB — POCT INR: INR: 2 (ref 2.0–3.0)

## 2023-03-25 NOTE — Patient Instructions (Signed)
Continue warfarin 1/2 tablet daily except 1 tablet on Mondays, Wednesdays and Fridays.   Continue greens Recheck in 6 weeks.  

## 2023-03-27 ENCOUNTER — Other Ambulatory Visit: Payer: Self-pay | Admitting: Cardiovascular Disease

## 2023-04-01 ENCOUNTER — Other Ambulatory Visit: Payer: Self-pay | Admitting: Cardiovascular Disease

## 2023-04-01 DIAGNOSIS — I4821 Permanent atrial fibrillation: Secondary | ICD-10-CM

## 2023-04-02 NOTE — Telephone Encounter (Signed)
Prescription refill request received for warfarin Lov: 02/03/23 [place] Ouch)  Next INR check: 05/06/23 Warfarin tablet strength: 5mg   Appropriate dose. Refill sent.

## 2023-04-06 NOTE — Progress Notes (Signed)
 Name: Jerry Cain DOB: 1932-02-06 MRN: [id number]  History of Present Illness: Jerry Cain is a 89 y.o. male who presents today at [facility] [place].  - GU history: 1. BPH with BOO & LUTS (frequency, nocturia, urgency, urge incontinence, incomplete bladder emptying). - LUTS likely exacerbated by diuretic use (Lasix ). 2. Elevated PSA. - Distant history of a negative prostate biopsy. - He and Jerry Cain have agreed to abstain from further PSA testing.  At last visit with Jerry Cain: - He has some increased UUI and his PVR is up [273 ml].  He will continue the finasteride  which was filled on 02/20/23 and I will start him on tamsulosin .   Side effects reviewed.  He will return in 4-6 weeks for a PVR and then in 6 months.   Today: He reports that he took the Flomax  about 5 times and then discontinued it because he had weak urinary stream, increased sensations of incomplete bladder emptying, and noticeably decreased urine output while taking it. Did notice decreased urinary frequency while on Flomax  however. He reports that he continued the Proscar  5 mg daily.   Today he reports persistent urinary urgency, frequency, nocturia x2. Denies urinary hesitancy, dysuria, gross hematuria, straining to void, or sensations of incomplete emptying. He reports medium pressure urinary stream.  He denies flank pain, abdominal pain, fevers, nausea, or vomiting.   Fall Screening: Do you usually have a device to assist in your mobility? Yes - cane   Medications: Current Outpatient Medications  Medication Sig Dispense Refill   furosemide  (LASIX ) 20 MG tablet TAKE (1) TABLET BY MOUTH ONCE DAILY. 90 tablet 3   albuterol  (PROVENTIL ) (2.5 MG/3ML) 0.083% nebulizer solution USE 1 VIAL IN NEBULIZER EVERY 6 HOURS AS NEEDED. 75 mL 2   Calcium  Carbonate-Vitamin D  600-200 MG-UNIT TABS Take 1 tablet by mouth 2 (two) times daily.     carvedilol  (COREG ) 25 MG tablet Take 1 tablet  (25 mg total) by mouth 2 (two) times daily with a meal. 180 tablet 3   finasteride  (PROSCAR ) 5 MG tablet Take 1 tablet (5 mg total) by mouth daily. 90 tablet 3   Glucosamine 750 MG TABS Take 750 mg by mouth 2 (two) times daily.     isosorbide  mononitrate (IMDUR ) 60 MG 24 hr tablet Take 1 tablet (60 mg total) by mouth daily. 90 tablet 3   levothyroxine  (SYNTHROID ) 150 MCG tablet TAKE 1 TABLET BY MOUTH DAILY BEFORE BREAKFAST. 30 tablet 0   lisinopril  (ZESTRIL ) 20 MG tablet Take 1 tablet (20 mg total) by mouth daily. 90 tablet 3   Multiple Vitamin (MULTIVITAMIN) capsule Take 1 capsule by mouth every morning.     Nebulizer MISC 1 Units by Does not apply route daily. 1 each 0   nitroGLYCERIN  (NITROSTAT ) 0.4 MG SL tablet Place 0.4 mg under the tongue every 5 (five) minutes x 3 doses as needed for chest pain (If no relief after 2nd dose, proceed to ED or call 911).     rosuvastatin  (CRESTOR ) 10 MG tablet Take 1 tablet (10 mg total) by mouth daily. 90 tablet 3   traMADol  (ULTRAM ) 50 MG tablet Take 1 tablet (50 mg total) by mouth every 6 (six) hours as needed. 30 tablet 0   warfarin (COUMADIN ) 5 MG tablet TAKE (1/2) TO 1 TABLET BY MOUTH ONCE DAILY OR AS DIRECTED BY COUMADIN  CLINIC. 30 tablet 2   No current facility-administered medications for this visit.    Allergies: Allergies  Allergen  Reactions   Alpha Blocker Quinazolines     Tamsulosin  / Alfuzosin / Silodosin  caused adverse effects (weak urinary stream, increased sensations of incomplete bladder emptying, decreased urine output)   Carbapenems Other (See Comments)    Altered mental status   Cephalosporins     Unknown reaction   Nsaids Other (See Comments)    On blood thinner    Penicillins Swelling and Other (See Comments)    Did it involve swelling of the face/tongue/throat, SOB, or low BP? Unknown-possible swelling Did it involve sudden or severe rash/hives, skin peeling, or any reaction on the inside of your mouth or nose? Unknown Did  you need to seek medical attention at a hospital or doctor's office? Unknown When did it last happen? Over 10 years (possible swelling)      If all above answers are NO, may proceed with cephalosporin use.     Past Medical History:  Diagnosis Date   Adenomatous colon polyp 2001   Anxiety    Atrial fibrillation [facility])    takes Coumadin  daily   BPH (benign prostatic hyperplasia)    takes Proscar  daily   CAD (coronary artery disease) 2009   a. s/p PTCA and stenting of mid-LAD and LCx in 2009 b. NST in 03/2018 showing prior infarct with no current ischemia   Carpal tunnel syndrome    right   Complication of anesthesia    hard to wake up   Diverticulosis 2007   tcs by Jerry Cain   GERD (gastroesophageal reflux disease)    Gout    Hypercholesterolemia    takes Atorvastatin  daily   Hypertension    takes Imdur ,Coreg ,and Lisinopril  daily   Hypothyroidism    takes SYnthroid  daily   Joint pain    Joint swelling    Nocturia    Numbness    right hand pointer and middle finger   Osteoarthritis    left knee   Pneumonia    many,many yrs ago   Tubular adenoma 2001   Past Surgical History:  Procedure Laterality Date   KATRINA OSTEOTOMY Right 03/17/2013   Procedure: KATRINA OSTEOTOMY;  Surgeon: Jerry Cain, DPM;  Location: [facility];  Service: Orthopedics;  Laterality: Right;   BUNIONECTOMY Right 03/17/2013   Procedure: BUNIONECTOMY, Arthroplasty 2nd toe right foot;  Surgeon: Jerry Cain, DPM;  Location: [facility];  Service: Orthopedics;  Laterality: Right;   CARDIAC CATHETERIZATION  06/11/2007   PTCA X2 in 06/2007:  2 overlapping Cypher stents to LAD (2.5x13 and 3.0x23)   CARDIAC CATHETERIZATION  06/21/2007   Cypher stent 2.5 x 13  to OM branch   CARDIOVERSION  12/12/2011   Procedure: CARDIOVERSION;  Surgeon: Jerry Balding, MD;  Location: [facility];  Service: Cardiovascular;  Laterality: N/A;   CATARACT EXTRACTION     bilateral   COLONOSCOPY  2007   Jerry Cain- L  sided diverticulosis. Next TCS 2012 due to h/o tubular adenoma.   COLONOSCOPY  02/26/2011   Procedure: COLONOSCOPY;  Surgeon: Jerry CHRISTELLA Hollingshead, MD;  Location: [facility];  Service: Endoscopy;  Laterality: N/A;  11:05   CORONARY ANGIOPLASTY     x 3    FLEXOR TENOTOMY  Left 03/17/2013   Procedure: PERCUTANEOUS FLEXOR TENOTOMY 3RD TOE LEFT FOOT;  Surgeon: Jerry Cain, DPM;  Location: [facility];  Service: Orthopedics;  Laterality: Left;   HERNIA REPAIR  2009   left inguinal   rot Left    TEE WITHOUT CARDIOVERSION  12/12/2011   Procedure: TRANSESOPHAGEAL ECHOCARDIOGRAM (  TEE);  Surgeon: Jerry Balding, MD;  Location: [facility] ENDOSCOPY;  Service: Cardiovascular;  Laterality: N/A;   successful/sinus rhythm   TOTAL KNEE ARTHROPLASTY Left 06/14/2013   Procedure: TOTAL KNEE ARTHROPLASTY;  Surgeon: Maude LELON Right, MD;  Location: [facility] OR;  Service: Orthopedics;  Laterality: Left;   Family History  Problem Relation Age of Onset   Pneumonia Father    Heart disease Brother        open heart surgery at age 59   Colon cancer Neg Hx    Social History   Socioeconomic History   Marital status: Married    Spouse name: Not on file   Number of children: 4   Years of education: Not on file   Highest education level: Not on file  Occupational History   Occupation: retired  Tobacco Use   Smoking status: Never   Smokeless tobacco: Never  Vaping Use   Vaping status: Never Used  Substance and Sexual Activity   Alcohol  use: No   Drug use: No   Sexual activity: Not on file  Other Topics Concern   Not on file  Social History Narrative   Not on file   Social Drivers of Health   Financial Resource Strain: Not on file  Food Insecurity: Not on file  Transportation Needs: Not on file  Physical Activity: Not on file  Stress: Not on file  Social Connections: Not on file  Intimate Partner Violence: Not on file    Review of Systems Constitutional: Patient denies any unintentional weight loss or  change in strength lntegumentary: Patient denies any rashes or pruritus Cardiovascular: Patient denies chest pain or syncope Respiratory: Patient denies shortness of breath Gastrointestinal: Patient denies nausea, vomiting, constipation, or diarrhea Musculoskeletal: Patient denies muscle cramps or weakness Neurologic: Patient denies convulsions or seizures Allergic/Immunologic: Patient denies recent allergic reaction(s) Hematologic/Lymphatic: Patient denies bleeding tendencies Endocrine: Patient denies heat/cold intolerance  GU: As per HPI.  OBJECTIVE Vitals:   04/14/23 1117  BP: 113/63  Pulse: 84  Temp: 97.7 F (36.5 C)   There is no height or weight on file to calculate BMI.  Physical Examination Constitutional: No obvious distress; patient is non-toxic appearing  Cardiovascular: No visible lower extremity edema.  Respiratory: The patient does not have audible wheezing/stridor; respirations do not appear labored  Gastrointestinal: Abdomen non-distended Musculoskeletal: Normal ROM of UEs  Skin: No obvious rashes/open sores  Neurologic: CN 2-12 grossly intact Psychiatric: Answered questions appropriately with normal affect  Hematologic/Lymphatic/Immunologic: No obvious bruises or sites of spontaneous bleeding  UA: negative  PVR: 354 ml first attempt; 268 ml second attempt  ASSESSMENT Incomplete bladder emptying - Plan: US  RENAL  Benign prostatic hyperplasia, unspecified whether lower urinary tract symptoms present - Plan: Urinalysis, Routine w reflex microscopic, BLADDER SCAN AMB NON-IMAGING, US  RENAL, finasteride  (PROSCAR ) 5 MG tablet  Nocturia - Plan: Urinalysis, Routine w reflex microscopic, BLADDER SCAN AMB NON-IMAGING  Urgency of urination - Plan: Urinalysis, Routine w reflex microscopic, BLADDER SCAN AMB NON-IMAGING  Urge incontinence - Plan: Urinalysis, Routine w reflex microscopic, BLADDER SCAN AMB NON-IMAGING  We discussed his incomplete bladder emptying  including possible etiologies and related risks. PVR is stable compared to November 2024. Flomax  discontinued due to patient intolerance / adverse effects; previously had similar problems with Alfuzosin and Silodosin . Advised to continue Proscar  5 mg daily. We agreed to obtain RUS to assess for hydronephrosis. As long as there are no concerning findings with that, will plan to keep follow up as previously scheduled with  Jerry Cain on 09/03/2023. If RUS shows hydronephrosis will plan to consult with Jerry Cain to determine next steps. Pt verbalized understanding and agreement. All questions were answered.  PLAN Advised the following: 1. Continue Proscar  5 mg daily. Refills sent. 2. Return for RUS within 2 weeks; keep f/u with Jerry Cain in May 2025 as scheduled.  Orders Placed This Encounter  Procedures   US  RENAL    Standing Status:   Future    Expected Date:   04/14/2023    Expiration Date:   04/13/2024    Reason for Exam (SYMPTOM  OR DIAGNOSIS REQUIRED):   kidney stone known or suspected    Preferred imaging location?:   [facility]   Urinalysis, Routine w reflex microscopic   BLADDER SCAN AMB NON-IMAGING   Total time spent caring for the patient today was over 30 minutes. This includes time spent on the date of the visit reviewing the patient's chart before the visit, time spent during the visit, and time spent after the visit on documentation. Over 50% of that time was spent in face-to-face time with this patient for direct counseling. E&M based on time and complexity of medical decision making.  It has been explained that the patient is to follow regularly with their PCP in addition to all other providers involved in their care and to follow instructions provided by these respective offices. Patient advised to contact urology clinic if any urologic-pertaining questions, concerns, new symptoms or problems arise in the interim period.  There are no Patient Instructions on file for this  visit.  Electronically signed by:  Lauraine JAYSON Oz, FNP   04/14/23    12:14 PM

## 2023-04-14 ENCOUNTER — Encounter: Payer: Self-pay | Admitting: Urology

## 2023-04-14 ENCOUNTER — Ambulatory Visit (INDEPENDENT_AMBULATORY_CARE_PROVIDER_SITE_OTHER): Payer: HMO | Admitting: Urology

## 2023-04-14 VITALS — BP 113/63 | HR 84 | Temp 97.7°F

## 2023-04-14 DIAGNOSIS — R351 Nocturia: Secondary | ICD-10-CM | POA: Diagnosis not present

## 2023-04-14 DIAGNOSIS — N3941 Urge incontinence: Secondary | ICD-10-CM | POA: Diagnosis not present

## 2023-04-14 DIAGNOSIS — R339 Retention of urine, unspecified: Secondary | ICD-10-CM | POA: Diagnosis not present

## 2023-04-14 DIAGNOSIS — N400 Enlarged prostate without lower urinary tract symptoms: Secondary | ICD-10-CM

## 2023-04-14 DIAGNOSIS — R3915 Urgency of urination: Secondary | ICD-10-CM

## 2023-04-14 LAB — URINALYSIS, ROUTINE W REFLEX MICROSCOPIC
Bilirubin, UA: NEGATIVE
Glucose, UA: NEGATIVE
Ketones, UA: NEGATIVE
Leukocytes,UA: NEGATIVE
Nitrite, UA: NEGATIVE
Protein,UA: NEGATIVE
RBC, UA: NEGATIVE
Specific Gravity, UA: 1.01 (ref 1.005–1.030)
Urobilinogen, Ur: 0.2 mg/dL (ref 0.2–1.0)
pH, UA: 6.5 (ref 5.0–7.5)

## 2023-04-14 MED ORDER — FINASTERIDE 5 MG PO TABS: 5.0000 mg | ORAL_TABLET | Freq: Every day | ORAL | 3 refills | Status: AC

## 2023-04-14 NOTE — Progress Notes (Signed)
 PVR >268

## 2023-04-14 NOTE — Progress Notes (Signed)
post void residual =354

## 2023-04-20 ENCOUNTER — Other Ambulatory Visit: Payer: Self-pay | Admitting: Student

## 2023-04-23 ENCOUNTER — Telehealth: Payer: Self-pay

## 2023-04-23 ENCOUNTER — Ambulatory Visit (HOSPITAL_COMMUNITY)
Admission: RE | Admit: 2023-04-23 | Discharge: 2023-04-23 | Disposition: A | Discharge status: Home / Self Care | Payer: HMO | Source: Ambulatory Visit | Attending: Urology | Admitting: Urology

## 2023-04-23 DIAGNOSIS — N281 Cyst of kidney, acquired: Secondary | ICD-10-CM | POA: Insufficient documentation

## 2023-04-23 DIAGNOSIS — N400 Enlarged prostate without lower urinary tract symptoms: Secondary | ICD-10-CM | POA: Diagnosis present

## 2023-04-23 DIAGNOSIS — R339 Retention of urine, unspecified: Secondary | ICD-10-CM

## 2023-04-23 DIAGNOSIS — R9341 Abnormal radiologic findings on diagnostic imaging of renal pelvis, ureter, or bladder: Secondary | ICD-10-CM | POA: Diagnosis not present

## 2023-04-23 NOTE — Telephone Encounter (Signed)
Patient was made aware and voiced understanding.

## 2023-04-23 NOTE — Telephone Encounter (Signed)
-----   Message from Donnita Falls sent at 04/23/2023 12:32 PM EST ----- Please let pt know RUS showed no overt hydronephrosis. OK to follow up with Dr. Annabell Howells in May 2025 as planned.

## 2023-04-27 ENCOUNTER — Other Ambulatory Visit (HOSPITAL_COMMUNITY): Payer: Self-pay

## 2023-04-30 ENCOUNTER — Other Ambulatory Visit: Payer: Self-pay | Admitting: Cardiovascular Disease

## 2023-05-04 ENCOUNTER — Telehealth: Payer: Self-pay | Admitting: Student

## 2023-05-04 NOTE — Telephone Encounter (Signed)
*  STAT* If patient is at the pharmacy, call can be transferred to refill team.   1. Which medications need to be refilled? (please list name of each medication and dose if known)  levothyroxine (SYNTHROID) 150 MCG tablet  2. Which pharmacy/location (including street and city if local pharmacy) is medication to be sent to? [facility] - [place], [place] - [address]  3. Do they need a 30 day or 90 day supply?   90 day supply

## 2023-05-04 NOTE — Telephone Encounter (Signed)
Spoke with pt and he will reach out to urgent care to have this med filled.

## 2023-05-06 ENCOUNTER — Ambulatory Visit: Payer: HMO | Attending: Cardiology | Admitting: *Deleted

## 2023-05-06 DIAGNOSIS — Z5181 Encounter for therapeutic drug level monitoring: Secondary | ICD-10-CM | POA: Diagnosis not present

## 2023-05-06 DIAGNOSIS — I4891 Unspecified atrial fibrillation: Secondary | ICD-10-CM

## 2023-05-06 LAB — POCT INR: INR: 1.8 — AB (ref 2.0–3.0)

## 2023-05-06 NOTE — Patient Instructions (Signed)
Take warfarin 1 1/2 tablets tonight then increase dose to 1 tablet daily except 1/2 tablet on Tuesdays, Thursdays and Saturdays.  Continue greens Recheck in 3 weeks

## 2023-05-12 ENCOUNTER — Ambulatory Visit
Admission: EM | Admit: 2023-05-12 | Discharge: 2023-05-12 | Disposition: A | Discharge status: Home / Self Care | Payer: HMO | Attending: Family Medicine | Admitting: Family Medicine

## 2023-05-12 ENCOUNTER — Other Ambulatory Visit: Payer: Self-pay

## 2023-05-12 ENCOUNTER — Encounter: Payer: Self-pay | Admitting: Emergency Medicine

## 2023-05-12 DIAGNOSIS — E039 Hypothyroidism, unspecified: Secondary | ICD-10-CM

## 2023-05-12 MED ORDER — LEVOTHYROXINE SODIUM 150 MCG PO TABS
150.0000 ug | ORAL_TABLET | Freq: Every day | ORAL | 1 refills | Status: DC
Start: 2023-05-12 — End: 2023-05-15

## 2023-05-12 NOTE — Discharge Instructions (Signed)
You have had labs (thyroid test) sent today. We will call you with any significant abnormalities or if there is need to begin or change treatment or pursue further follow up.  You may also review your test results online through MyChart. If you do not have a MyChart account, instructions to sign up should be on your discharge paperwork.

## 2023-05-12 NOTE — ED Provider Notes (Signed)
 [facility]   [id number] 05/12/23 Arrival Time: 0808  ASSESSMENT & PLAN:  1. Hypothyroidism, unspecified type    Reports feeling well. Pending: Labs Reviewed  TSH   Meds ordered this encounter  Medications   levothyroxine  (SYNTHROID ) 150 MCG tablet    Sig: Take 1 tablet (150 mcg total) by mouth daily before breakfast.    Dispense:  30 tablet    Refill:  1     Follow-up Information     Schedule an appointment as soon as possible for a visit  with Cook, Jayce G, DO.   Specialty: Family Medicine Contact information: [address] [place] [address] [phone]                 Reviewed expectations re: course of current medical issues. Questions answered. Outlined signs and symptoms indicating need for more acute intervention. Understanding verbalized. After Visit Summary given.   SUBJECTIVE: History from: Patient. Jerry Cain is a 89 y.o. male. Pt reports pcp retired and states is in between doctors. Pt reports has one dose left of levothyroxine  and is inquiring about refill.  Otherwise no concerns. Normal PO intake without n/v/d.  OBJECTIVE:  Vitals:   05/12/23 0825  BP: 124/77  Pulse: 63  Resp: 20  Temp: 97.6 F (36.4 C)  TempSrc: Oral  SpO2: 97%    General appearance: alert; no distress CV: regular Neurologic: normal gait with cane Psychological: alert and cooperative; normal mood and affect  Labs:  Labs Reviewed  TSH    Imaging: No results found.  Allergies  Allergen Reactions   Alpha Blocker Quinazolines     Tamsulosin  / Alfuzosin / Silodosin  caused adverse effects (weak urinary stream, increased sensations of incomplete bladder emptying, decreased urine output)   Carbapenems Other (See Comments)    Altered mental status   Cephalosporins     Unknown reaction   Nsaids Other (See Comments)    On blood thinner    Penicillins Swelling and Other (See Comments)    Did it involve swelling of the  face/tongue/throat, SOB, or low BP? Unknown-possible swelling Did it involve sudden or severe rash/hives, skin peeling, or any reaction on the inside of your mouth or nose? Unknown Did you need to seek medical attention at a hospital or doctor's office? Unknown When did it last happen? Over 10 years (possible swelling)      If all above answers are NO, may proceed with cephalosporin use.     Past Medical History:  Diagnosis Date   Adenomatous colon polyp 2001   Anxiety    Atrial fibrillation (HCC)    takes Coumadin  daily   BPH (benign prostatic hyperplasia)    takes Proscar  daily   CAD (coronary artery disease) 2009   a. s/p PTCA and stenting of mid-LAD and LCx in 2009 b. NST in 03/2018 showing prior infarct with no current ischemia   Carpal tunnel syndrome    right   Complication of anesthesia    hard to wake up   Diverticulosis 2007   tcs by Dr. Golda   GERD (gastroesophageal reflux disease)    Gout    Hypercholesterolemia    takes Atorvastatin  daily   Hypertension    takes Imdur ,Coreg ,and Lisinopril  daily   Hypothyroidism    takes SYnthroid  daily   Joint pain    Joint swelling    Nocturia    Numbness    right hand pointer and middle finger   Osteoarthritis    left  knee   Pneumonia    many,many yrs ago   Tubular adenoma 2001   Social History   Socioeconomic History   Marital status: Married    Spouse name: Not on file   Number of children: 4   Years of education: Not on file   Highest education level: Not on file  Occupational History   Occupation: retired  Tobacco Use   Smoking status: Never   Smokeless tobacco: Never  Vaping Use   Vaping status: Never Used  Substance and Sexual Activity   Alcohol  use: No   Drug use: No   Sexual activity: Not on file  Other Topics Concern   Not on file  Social History Narrative   Not on file   Social Drivers of Health   Financial Resource Strain: Not on file  Food Insecurity: Not on file  Transportation  Needs: Not on file  Physical Activity: Not on file  Stress: Not on file  Social Connections: Not on file  Intimate Partner Violence: Not on file   Family History  Problem Relation Age of Onset   Pneumonia Father    Heart disease Brother        open heart surgery at age 97   Colon cancer Neg Hx    Past Surgical History:  Procedure Laterality Date   KATRINA OSTEOTOMY Right 03/17/2013   Procedure: KATRINA OSTEOTOMY;  Surgeon: Morene Donley Anon, DPM;  Location: [facility];  Service: Orthopedics;  Laterality: Right;   BUNIONECTOMY Right 03/17/2013   Procedure: BUNIONECTOMY, Arthroplasty 2nd toe right foot;  Surgeon: Morene Donley Anon, DPM;  Location: [facility];  Service: Orthopedics;  Laterality: Right;   CARDIAC CATHETERIZATION  06/11/2007   PTCA X2 in 06/2007:  2 overlapping Cypher stents to LAD (2.5x13 and 3.0x23)   CARDIAC CATHETERIZATION  06/21/2007   Cypher stent 2.5 x 13  to OM branch   CARDIOVERSION  12/12/2011   Procedure: CARDIOVERSION;  Surgeon: Jerel Balding, MD;  Location: [facility];  Service: Cardiovascular;  Laterality: N/A;   CATARACT EXTRACTION     bilateral   COLONOSCOPY  2007   Dr. Golda- L sided diverticulosis. Next TCS 2012 due to h/o tubular adenoma.   COLONOSCOPY  02/26/2011   Procedure: COLONOSCOPY;  Surgeon: Lamar CHRISTELLA Hollingshead, MD;  Location: [facility];  Service: Endoscopy;  Laterality: N/A;  11:05   CORONARY ANGIOPLASTY     x 3    FLEXOR TENOTOMY  Left 03/17/2013   Procedure: PERCUTANEOUS FLEXOR TENOTOMY 3RD TOE LEFT FOOT;  Surgeon: Morene Donley Anon, DPM;  Location: [facility];  Service: Orthopedics;  Laterality: Left;   HERNIA REPAIR  2009   left inguinal   rot Left    TEE WITHOUT CARDIOVERSION  12/12/2011   Procedure: TRANSESOPHAGEAL ECHOCARDIOGRAM (TEE);  Surgeon: Jerel Balding, MD;  Location: [facility] ENDOSCOPY;  Service: Cardiovascular;  Laterality: N/A;   successful/sinus rhythm   TOTAL KNEE ARTHROPLASTY Left 06/14/2013   Procedure: TOTAL KNEE  ARTHROPLASTY;  Surgeon: Maude LELON Right, MD;  Location: [facility] OR;  Service: Orthopedics;  Laterality: Left;     Rolinda Rogue, MD 05/12/23 [phone]

## 2023-05-12 NOTE — ED Triage Notes (Signed)
Pt reports pcp retired and states is in between doctors. Pt reports has one dose left of levothyroxine and is inquiring about refill.

## 2023-05-13 LAB — TSH: TSH: 9.02 u[IU]/mL — ABNORMAL HIGH (ref 0.450–4.500)

## 2023-05-15 ENCOUNTER — Encounter: Payer: Self-pay | Admitting: Family Medicine

## 2023-05-15 ENCOUNTER — Ambulatory Visit (INDEPENDENT_AMBULATORY_CARE_PROVIDER_SITE_OTHER): Payer: HMO | Admitting: Family Medicine

## 2023-05-15 VITALS — BP 135/76 | HR 71 | Temp 97.6°F | Resp 18 | Ht 71.0 in | Wt 189.8 lb

## 2023-05-15 DIAGNOSIS — E039 Hypothyroidism, unspecified: Secondary | ICD-10-CM

## 2023-05-15 MED ORDER — LEVOTHYROXINE SODIUM 175 MCG PO TABS: 175.0000 ug | ORAL_TABLET | Freq: Every day | ORAL | 2 refills | Status: DC

## 2023-05-15 NOTE — Progress Notes (Signed)
 New Patient Office Visit  Subjective    [name], male    DOB: 03-11-1932  Age: 89 y.o. MRN: [id number]  CC:  Chief Complaint  Patient presents with   Establish Care    Patient is here to establish care with new PCP, Patient needs refills on Levothyroxine     HPI Jerry Cain presents to establish care.  Pt is here for refills on his thyroid  medicine, Levothyroxine  150mcg daily. He reports his previous PCP retired and he was getting his medicine refilled through his cardiologist who he is seeing for A fib and on coumadin  therapy. He recently went to urgent care to get his Levothyroxine  refilled and had TSH done. Levels returned at 9.0. Pt reports he's been taking his thyroid  replacement every day on an empty stomach at least an hour before any of his other medicines or food.    Outpatient Encounter Medications as of 05/15/2023  Medication Sig   albuterol  (PROVENTIL ) (2.5 MG/3ML) 0.083% nebulizer solution USE 1 VIAL IN NEBULIZER EVERY 6 HOURS AS NEEDED.   Calcium  Carbonate-Vitamin D  600-200 MG-UNIT TABS Take 1 tablet by mouth 2 (two) times daily.   carvedilol  (COREG ) 25 MG tablet Take 1 tablet (25 mg total) by mouth 2 (two) times daily with a meal.   finasteride  (PROSCAR ) 5 MG tablet Take 1 tablet (5 mg total) by mouth daily.   furosemide  (LASIX ) 20 MG tablet TAKE (1) TABLET BY MOUTH ONCE DAILY.   Glucosamine 750 MG TABS Take 750 mg by mouth 2 (two) times daily.   isosorbide  mononitrate (IMDUR ) 60 MG 24 hr tablet Take 1 tablet (60 mg total) by mouth daily.   lisinopril  (ZESTRIL ) 20 MG tablet Take 1 tablet (20 mg total) by mouth daily.   Multiple Vitamin (MULTIVITAMIN) capsule Take 1 capsule by mouth every morning.   Nebulizer MISC 1 Units by Does not apply route daily.   nitroGLYCERIN  (NITROSTAT ) 0.4 MG SL tablet Place 0.4 mg under the tongue every 5 (five) minutes x 3 doses as needed for chest pain (If no relief after 2nd dose, proceed to ED or call 911).    rosuvastatin  (CRESTOR ) 10 MG tablet Take 1 tablet (10 mg total) by mouth daily.   warfarin (COUMADIN ) 5 MG tablet TAKE (1/2) TO 1 TABLET BY MOUTH ONCE DAILY OR AS DIRECTED BY COUMADIN  CLINIC.   [DISCONTINUED] levothyroxine  (SYNTHROID ) 150 MCG tablet Take 1 tablet (150 mcg total) by mouth daily before breakfast.   levothyroxine  (SYNTHROID ) 175 MCG tablet Take 1 tablet (175 mcg total) by mouth daily before breakfast.   [DISCONTINUED] traMADol  (ULTRAM ) 50 MG tablet Take 1 tablet (50 mg total) by mouth every 6 (six) hours as needed.   No facility-administered encounter medications on file as of 05/15/2023.    Past Medical History:  Diagnosis Date   Adenomatous colon polyp 2001   Anxiety    Atrial fibrillation (HCC)    takes Coumadin  daily   BPH (benign prostatic hyperplasia)    takes Proscar  daily   CAD (coronary artery disease) 2009   a. s/p PTCA and stenting of mid-LAD and LCx in 2009 b. NST in 03/2018 showing prior infarct with no current ischemia   Carpal tunnel syndrome    right   Complication of anesthesia    hard to wake up   Diverticulosis 2007   tcs by Dr. Golda   GERD (gastroesophageal reflux disease)    Gout    Hypercholesterolemia    takes Atorvastatin  daily  Hypertension    takes Imdur ,Coreg ,and Lisinopril  daily   Hypothyroidism    takes SYnthroid  daily   Joint pain    Joint swelling    Nocturia    Numbness    right hand pointer and middle finger   Osteoarthritis    left knee   Pneumonia    many,many yrs ago   Tubular adenoma 2001    Past Surgical History:  Procedure Laterality Date   KATRINA OSTEOTOMY Right 03/17/2013   Procedure: KATRINA OSTEOTOMY;  Surgeon: Morene Donley Anon, DPM;  Location: [facility];  Service: Orthopedics;  Laterality: Right;   BUNIONECTOMY Right 03/17/2013   Procedure: BUNIONECTOMY, Arthroplasty 2nd toe right foot;  Surgeon: Morene Donley Anon, DPM;  Location: [facility];  Service: Orthopedics;  Laterality: Right;   CARDIAC  CATHETERIZATION  06/11/2007   PTCA X2 in 06/2007:  2 overlapping Cypher stents to LAD (2.5x13 and 3.0x23)   CARDIAC CATHETERIZATION  06/21/2007   Cypher stent 2.5 x 13  to OM branch   CARDIOVERSION  12/12/2011   Procedure: CARDIOVERSION;  Surgeon: Jerel Balding, MD;  Location: [facility];  Service: Cardiovascular;  Laterality: N/A;   CATARACT EXTRACTION     bilateral   COLONOSCOPY  2007   Dr. Golda- L sided diverticulosis. Next TCS 2012 due to h/o tubular adenoma.   COLONOSCOPY  02/26/2011   Procedure: COLONOSCOPY;  Surgeon: Lamar CHRISTELLA Hollingshead, MD;  Location: [facility];  Service: Endoscopy;  Laterality: N/A;  11:05   CORONARY ANGIOPLASTY     x 3    FLEXOR TENOTOMY  Left 03/17/2013   Procedure: PERCUTANEOUS FLEXOR TENOTOMY 3RD TOE LEFT FOOT;  Surgeon: Morene Donley Anon, DPM;  Location: [facility];  Service: Orthopedics;  Laterality: Left;   HERNIA REPAIR  2009   left inguinal   rot Left    TEE WITHOUT CARDIOVERSION  12/12/2011   Procedure: TRANSESOPHAGEAL ECHOCARDIOGRAM (TEE);  Surgeon: Jerel Balding, MD;  Location: [facility];  Service: Cardiovascular;  Laterality: N/A;   successful/sinus rhythm   TOTAL KNEE ARTHROPLASTY Left 06/14/2013   Procedure: TOTAL KNEE ARTHROPLASTY;  Surgeon: Maude LELON Right, MD;  Location: [facility] OR;  Service: Orthopedics;  Laterality: Left;    Family History  Problem Relation Age of Onset   Pneumonia Father    Heart disease Brother        open heart surgery at age 62   Colon cancer Neg Hx     Social History   Socioeconomic History   Marital status: Married    Spouse name: Not on file   Number of children: 4   Years of education: Not on file   Highest education level: Not on file  Occupational History   Occupation: retired  Tobacco Use   Smoking status: Never   Smokeless tobacco: Never  Vaping Use   Vaping status: Never Used  Substance and Sexual Activity   Alcohol  use: No   Drug use: No   Sexual activity: Not on file  Other Topics Concern    Not on file  Social History Narrative   Not on file   Social Drivers of Health   Financial Resource Strain: Not on file  Food Insecurity: Not on file  Transportation Needs: Not on file  Physical Activity: Not on file  Stress: Not on file  Social Connections: Not on file  Intimate Partner Violence: Not on file    Review of Systems  All other systems reviewed and are negative.       Objective  BP 135/76   Pulse 71   Temp 97.6 F (36.4 C) (Oral)   Resp 18   Ht 5' 11 (1.803 m)   Wt 189 lb 12.8 oz (86.1 kg)   SpO2 97%   BMI 26.47 kg/m   Physical Exam Vitals and nursing note reviewed.  Constitutional:      Appearance: Normal appearance. He is normal weight.  HENT:     Head: Normocephalic and atraumatic.     Right Ear: External ear normal.     Left Ear: External ear normal.     Nose: Nose normal.     Mouth/Throat:     Mouth: Mucous membranes are moist.     Pharynx: Oropharynx is clear.  Eyes:     Conjunctiva/sclera: Conjunctivae normal.     Pupils: Pupils are equal, round, and reactive to light.  Cardiovascular:     Rate and Rhythm: Normal rate and regular rhythm.     Pulses: Normal pulses.     Heart sounds: Normal heart sounds.  Pulmonary:     Effort: Pulmonary effort is normal.     Breath sounds: Normal breath sounds.  Skin:    General: Skin is warm.     Capillary Refill: Capillary refill takes less than 2 seconds.  Neurological:     General: No focal deficit present.     Mental Status: He is alert and oriented to person, place, and time. Mental status is at baseline.  Psychiatric:        Mood and Affect: Mood normal.        Behavior: Behavior normal.        Thought Content: Thought content normal.        Judgment: Judgment normal.        Assessment & Plan:   Problem List Items Addressed This Visit   None Visit Diagnoses       Acquired hypothyroidism    -  Primary   Relevant Medications   levothyroxine  (SYNTHROID ) 175 MCG tablet       Acquired hypothyroidism -     Levothyroxine  Sodium; Take 1 tablet (175 mcg total) by mouth daily before breakfast.  Dispense: 30 tablet; Refill: 2  Pt has been on Levothyroxine  150mcg daily. Will need to adjust dose and increase due to elevated TSH at 9.0. Will change to 175 mcg daily to take on an empty stomach and repeat labs in 4 weeks.   Return in about 4 weeks (around 06/12/2023) for Hypothyroidism.   Torrence CINDERELLA Barrier, MD

## 2023-05-20 ENCOUNTER — Other Ambulatory Visit (HOSPITAL_COMMUNITY): Payer: Self-pay

## 2023-05-27 ENCOUNTER — Ambulatory Visit: Payer: HMO | Attending: Cardiology | Admitting: *Deleted

## 2023-05-27 DIAGNOSIS — Z5181 Encounter for therapeutic drug level monitoring: Secondary | ICD-10-CM

## 2023-05-27 DIAGNOSIS — I4891 Unspecified atrial fibrillation: Secondary | ICD-10-CM

## 2023-05-27 LAB — POCT INR: INR: 2.6 (ref 2.0–3.0)

## 2023-05-27 NOTE — Patient Instructions (Signed)
Continue warfarin 1 tablet daily except 1/2 tablet on Tuesdays, Thursdays and Saturdays.  Continue greens Recheck in 4 weeks

## 2023-06-09 ENCOUNTER — Ambulatory Visit (HOSPITAL_BASED_OUTPATIENT_CLINIC_OR_DEPARTMENT_OTHER): Payer: HMO | Admitting: General Surgery

## 2023-06-12 ENCOUNTER — Ambulatory Visit: Payer: HMO | Admitting: Family Medicine

## 2023-06-23 ENCOUNTER — Ambulatory Visit (INDEPENDENT_AMBULATORY_CARE_PROVIDER_SITE_OTHER): Admitting: Family Medicine

## 2023-06-23 ENCOUNTER — Encounter: Payer: Self-pay | Admitting: Family Medicine

## 2023-06-23 VITALS — BP 116/63 | HR 73 | Temp 97.6°F | Resp 18 | Ht 71.0 in | Wt 188.0 lb

## 2023-06-23 DIAGNOSIS — R7303 Prediabetes: Secondary | ICD-10-CM | POA: Diagnosis not present

## 2023-06-23 DIAGNOSIS — E039 Hypothyroidism, unspecified: Secondary | ICD-10-CM | POA: Diagnosis not present

## 2023-06-23 NOTE — Progress Notes (Signed)
 Established Patient Office Visit  Subjective   [name], male    DOB: 02-25-1932  Age: 89 y.o. MRN: [id number]  Chief Complaint  Patient presents with  . Medical Management of Chronic Issues    Patient is here for a 4 month follow up    HPI  Hypothyroidism Pt with hx of hypothyroidism. Had synthroid dose changed. Time for recheck. Taking as directed an hour before any other medicines or food.  Left foot wound Seeing wound care in [place]. He reports wound isn't healing as suspected. Has hx of prediabetes. Last A1c 6.0 11 months ago.  Pt taking calcium supplements BID. Says he sometimes gets muscle aches.    Review of Systems  Musculoskeletal:        Occasional muscle aches  All other systems reviewed and are negative.    Objective:     BP 116/63   Pulse 73   Temp 97.6 F (36.4 C) (Oral)   Resp 18   Ht 5\' 11"  (1.803 m)   Wt 188 lb (85.3 kg)   SpO2 96%   BMI 26.22 kg/m  BP Readings from Last 3 Encounters:  06/23/23 116/63  05/15/23 135/76  05/12/23 124/77      Physical Exam Vitals and nursing note reviewed.  Constitutional:      Appearance: Normal appearance. He is normal weight.  HENT:     Head: Normocephalic and atraumatic.     Right Ear: External ear normal.     Left Ear: External ear normal.     Nose: Nose normal.     Mouth/Throat:     Mouth: Mucous membranes are moist.     Pharynx: Oropharynx is clear.  Eyes:     Conjunctiva/sclera: Conjunctivae normal.     Pupils: Pupils are equal, round, and reactive to light.  Cardiovascular:     Rate and Rhythm: Normal rate and regular rhythm.     Pulses: Normal pulses.     Heart sounds: Normal heart sounds.  Pulmonary:     Effort: Pulmonary effort is normal.     Breath sounds: Normal breath sounds.  Skin:    General: Skin is warm.     Capillary Refill: Capillary refill takes less than 2 seconds.  Neurological:     General: No focal deficit present.     Mental Status: He is alert  and oriented to person, place, and time. Mental status is at baseline.     Gait: Gait abnormal.     Comments: Walks with cane, left foot in boot  Psychiatric:        Mood and Affect: Mood normal.        Behavior: Behavior normal.        Thought Content: Thought content normal.        Judgment: Judgment normal.    No results found for any visits on 06/23/23.  Last metabolic panel Lab Results  Component Value Date   GLUCOSE 114 (H) 07/04/2022   NA 139 07/04/2022   K 3.6 07/04/2022   CL 103 07/04/2022   CO2 29 07/04/2022   BUN 21 07/04/2022   CREATININE 0.95 07/04/2022   GFRNONAA >60 07/04/2022   CALCIUM 9.1 07/04/2022   PROT 6.8 07/04/2022   ALBUMIN 4.0 07/04/2022   LABGLOB 2.7 08/27/2017   AGRATIO 1.5 08/27/2017   BILITOT 1.4 (H) 07/04/2022   ALKPHOS 47 07/04/2022   AST 24 07/04/2022   ALT 22 07/04/2022   ANIONGAP 7 07/04/2022  Last hemoglobin A1c Lab Results  Component Value Date   HGBA1C 6.0 (H) 07/04/2022      The ASCVD Risk score (Arnett DK, et al., 2019) failed to calculate for the following reasons:   The 2019 ASCVD risk score is only valid for ages 14 to 53    Assessment & Plan:   Problem List Items Addressed This Visit   None Visit Diagnoses       Acquired hypothyroidism    -  Primary   Relevant Orders   TSH   T4, free     Prediabetes       Relevant Orders   Hemoglobin A1c   Basic Metabolic Panel (BMET)      Acquired hypothyroidism -     TSH -     T4, free  Prediabetes -     Hemoglobin A1c -     Basic metabolic panel   Recheck thyroid today. Continue Synthroid daily for now. To recheck A1c as pt has wound to left foot that won't heal. To also check BMP as pt is on calcium supplements and reports occasional muscle aches. Return in about 3 months (around 09/23/2023) for Chronic condition follow up.    Suzan Slick, MD

## 2023-06-24 ENCOUNTER — Ambulatory Visit: Payer: HMO | Attending: Cardiology | Admitting: *Deleted

## 2023-06-24 ENCOUNTER — Encounter: Payer: Self-pay | Admitting: Family Medicine

## 2023-06-24 DIAGNOSIS — I4891 Unspecified atrial fibrillation: Secondary | ICD-10-CM

## 2023-06-24 DIAGNOSIS — Z5181 Encounter for therapeutic drug level monitoring: Secondary | ICD-10-CM

## 2023-06-24 LAB — HEMOGLOBIN A1C
Est. average glucose Bld gHb Est-mCnc: 120 mg/dL
Hgb A1c MFr Bld: 5.8 % — ABNORMAL HIGH (ref 4.8–5.6)

## 2023-06-24 LAB — T4, FREE: Free T4: 1.67 ng/dL (ref 0.82–1.77)

## 2023-06-24 LAB — BASIC METABOLIC PANEL
BUN/Creatinine Ratio: 22 (ref 10–24)
BUN: 22 mg/dL (ref 8–27)
CO2: 25 mmol/L (ref 20–29)
Calcium: 9.2 mg/dL (ref 8.6–10.2)
Chloride: 103 mmol/L (ref 96–106)
Creatinine, Ser: 0.98 mg/dL (ref 0.76–1.27)
Glucose: 100 mg/dL — ABNORMAL HIGH (ref 70–99)
Potassium: 4.6 mmol/L (ref 3.5–5.2)
Sodium: 141 mmol/L (ref 134–144)
eGFR: 74 mL/min/{1.73_m2} (ref >59)

## 2023-06-24 LAB — TSH: TSH: 3.96 u[IU]/mL (ref 0.450–4.500)

## 2023-06-24 LAB — POCT INR: INR: 2.8 (ref 2.0–3.0)

## 2023-06-24 NOTE — Patient Instructions (Signed)
 Continue warfarin 1 tablet daily except 1/2 tablet on Tuesdays, Thursdays and Saturdays.  Continue greens Recheck in 6 weeks

## 2023-07-01 NOTE — Progress Notes (Signed)
 Jerry Cain 89 y.o. 12-05-1931 Phone: [phone] (home)  Address: [address] [place] [address]  MRN: [id number] Primary MD : Morene Donley Anon  [facility]   Problem List Items Addressed This Visit     Diabetic ulcer of toe of left foot associated with type 2 diabetes mellitus, with fat layer exposed (CMS-HCC) - Primary   Left great toe ulcer  He has a superficial great toe ulcer with surrounding callus.  The callus was pared back.  He also had callus of the right third toe and the left fourth toes.  Continue hydrafera blue, kerlix and ACE.  Change MWF.    The apligraf would be $500 out of pocket for the patient and is cost prohibitive approval.  Follow up in two weeks.  He should apply aquaphor liberally.  Follow up in three weeks.   Preoperative diagnosis: Peri-wound callus of the left great toe, callus of the right third toe, left fourth toe  Postoperative diagnosis: Same  Procedure performed: Paring of the peri-wound callus of the left great toe and excision of slough, paring of two more calluses  Anesthesia: None  Surgeon: Duwaine Jumper, MD  Procedure performed in detail:   After informed consent was obtained, a 15 blade was used to pare back the peri-wound callus of the left great toe.  Slough was also removed from the ulcer.  The callus of the right third toe and left fourth toe were also pared back.  Patient tolerated procedure well.  I personally spent over 20 minutes face-to-face and non-face-to-face in the care of this patient, which includes all pre, intra, and post visit time on the date of service.  All documented time was specific to the E/M visit and does not include any procedures that may have been performed.    Wound Care Visit  Past Medical History:  Diagnosis Date  . A-fib   . Altered mental status   . BPH (benign prostatic hyperplasia)   . CAD (coronary artery disease)   . CHF  (congestive heart failure)   . GERD (gastroesophageal reflux disease)   . HTN (hypertension)   . Hypercholesterolemia   . Osteoarthritis   . Trifascicular block   . Venous stasis ulcer    No past surgical history on file. Allergies  Allergen Reactions  . Alpha 1 Blocker- Quinazolines   . Carbapenems   . Cephalosporins   . Nsaids (Non-Steroidal Anti-Inflammatory Drug)   . Penicillins     Meds:  Current Outpatient Medications:  .  albuterol  2.5 mg /3 mL (0.083 %) nebulizer solution, Inhale 3 mL (2.5 mg total) by nebulization every four (4) hours as needed., Disp: , Rfl:  .  calcium  carbonate-vitamin D3 600 mg-5 mcg (200 unit) per tablet, Take 1 mg by mouth Two (2) times a day (at 8am and 12:00)., Disp: , Rfl:  .  carvedilol  (COREG ) 25 MG tablet, Take 1 tablet (25 mg total) by mouth two (2) times a day., Disp: , Rfl:  .  finasteride  (PROSCAR ) 5 mg tablet, Take 1 tablet (5 mg total) by mouth daily. TAKE 1 TABLET (5 MG TOTAL) BY MOUTH DAILY., Disp: , Rfl:  .  furosemide  (LASIX ) 20 MG tablet, Take 1 tablet (20 mg total) by mouth daily at 0600., Disp: , Rfl:  .  glucosamine HCl 750 mg Tab, Take 750 mg by mouth two (2) times a day., Disp: , Rfl:  .  isosorbide  mononitrate (IMDUR ) 60 MG 24 hr tablet,  Take 1 tablet (60 mg total) by mouth daily., Disp: , Rfl:  .  levothyroxine  (SYNTHROID ) 150 MCG tablet, Take 1 tablet (150 mcg total) by mouth daily., Disp: , Rfl:  .  lisinopril  (PRINIVIL ,ZESTRIL ) 20 MG tablet, Take 1 tablet (20 mg total) by mouth daily., Disp: , Rfl:  .  multivitamin (MULTIPLE VITAMIN ORAL), Take 1 capsule by mouth every morning., Disp: , Rfl:  .  nitroglycerin  (NITROSTAT ) 0.4 MG SL tablet, Place 1 tablet (0.4 mg total) under the tongue every five (5) minutes as needed., Disp: , Rfl:  .  rosuvastatin  (CRESTOR ) 10 MG tablet, Take 1 tablet (10 mg total) by mouth daily., Disp: , Rfl:  .  warfarin (JANTOVEN ) 5 MG tablet, Take 1 tablet (5 mg total) by mouth daily with evening meal.  1/2 tablet 4 days per week, 1 tablet the other days, Disp: , Rfl:   SocHx:    FamHx: has no family status information on file.    Review of Systems A 12 system review of systems was negative except as noted in HPI  special needs met: patient walks with cane  Subjective:     Jerry Cain presents to the wound care clinic for evaluation of a left great toe wound.  In reviewing the referral, he developed the wound around September or October of 2024.  He has been seeing podiatry.  I personally reviewed Dr. Patton notes.  He reports it got worse but he only realized on Christmas that he was supposed to be putting the collagen patch on the toe. It is now getting better.  07/01/23  He returns for follow up.  He thinks it is doing better.  06/17/23  He returns for follow up.  He has no complaints.  05/27/23  He returns for follow up.  He is establishing care with a PCP in [place], Dr. Colette.  He has not seen his podiatrist for his toe nails.  05/06/23  Initial visit.  Objective:   BP 122/73 (BP Site: R Arm, BP Position: Sitting)   Pulse 71   Temp 36.5 C (97.7 F)   Resp 16   SpO2 96%  General:  well appearing  Pulmonary: CTAB, no wheezes, rhonci, crackles  CV:   RRR, S1,S2, no murmurs, gallops,rubs  GI: soft, bowel sounds active, non-tender  Neuro:    Alert and oriented  Psych : Normal mood and judgement  Pulse :   +2 DP  Wound:   0.4 x 0.3 x 0.1 cm

## 2023-07-31 ENCOUNTER — Other Ambulatory Visit: Payer: Self-pay | Admitting: Cardiovascular Disease

## 2023-07-31 DIAGNOSIS — I4821 Permanent atrial fibrillation: Secondary | ICD-10-CM

## 2023-07-31 NOTE — Telephone Encounter (Signed)
 Warfarin 5mg  refills Afib Last INR 06/24/23 Last OV 02/03/23

## 2023-08-04 ENCOUNTER — Ambulatory Visit: Attending: Cardiology | Admitting: *Deleted

## 2023-08-04 DIAGNOSIS — Z5181 Encounter for therapeutic drug level monitoring: Secondary | ICD-10-CM

## 2023-08-04 DIAGNOSIS — I4891 Unspecified atrial fibrillation: Secondary | ICD-10-CM

## 2023-08-04 LAB — POCT INR: INR: 2.9 (ref 2.0–3.0)

## 2023-08-04 NOTE — Patient Instructions (Signed)
 Continue warfarin 1 tablet daily except 1/2 tablet on Tuesdays, Thursdays and Saturdays.  Continue greens Recheck in 6 weeks

## 2023-08-10 ENCOUNTER — Encounter

## 2023-08-13 ENCOUNTER — Other Ambulatory Visit: Payer: Self-pay | Admitting: Family Medicine

## 2023-08-13 DIAGNOSIS — E039 Hypothyroidism, unspecified: Secondary | ICD-10-CM

## 2023-08-13 MED ORDER — LEVOTHYROXINE SODIUM 175 MCG PO TABS: 175.0000 ug | ORAL_TABLET | Freq: Every day | ORAL | 2 refills | Status: DC

## 2023-08-13 NOTE — Telephone Encounter (Signed)
 Copied from CRM [id number]. Topic: Clinical - Medication Refill >> Aug 13, 2023  9:27 AM Jerry Cain wrote: Medication: levothyroxine  (SYNTHROID ) 175 MCG tablet  Pt called to report that he requested this Monday. Please advise   Has the patient contacted their pharmacy? Yes (Agent: If no, request that the patient contact the pharmacy for the refill. If patient does not wish to contact the pharmacy document the reason why and proceed with request.) (Agent: If yes, when and what did the pharmacy advise?)  This is the patient's preferred pharmacy:  [facility] [place], [place] - [id number] [place] [address] Phone: [phone] Fax: [phone]  Is this the correct pharmacy for this prescription? Yes If no, delete pharmacy and type the correct one.   Has the prescription been filled recently? Yes  Is the patient out of the medication? Yes  Has the patient been seen for an appointment in the last year OR does the patient have an upcoming appointment? Yes  Can we respond through MyChart? Yes   Agent: Please be advised that Rx refills may take up to 3 business days. We ask that you follow-up with your pharmacy.

## 2023-09-03 ENCOUNTER — Ambulatory Visit: Payer: HMO | Admitting: Urology

## 2023-09-03 ENCOUNTER — Encounter: Payer: Self-pay | Admitting: Urology

## 2023-09-03 VITALS — BP 121/69 | HR 86

## 2023-09-03 DIAGNOSIS — N3941 Urge incontinence: Secondary | ICD-10-CM | POA: Diagnosis not present

## 2023-09-03 DIAGNOSIS — N400 Enlarged prostate without lower urinary tract symptoms: Secondary | ICD-10-CM

## 2023-09-03 DIAGNOSIS — N401 Enlarged prostate with lower urinary tract symptoms: Secondary | ICD-10-CM | POA: Diagnosis not present

## 2023-09-03 DIAGNOSIS — R3913 Splitting of urinary stream: Secondary | ICD-10-CM

## 2023-09-03 DIAGNOSIS — R339 Retention of urine, unspecified: Secondary | ICD-10-CM

## 2023-09-03 DIAGNOSIS — R338 Other retention of urine: Secondary | ICD-10-CM

## 2023-09-03 DIAGNOSIS — R3915 Urgency of urination: Secondary | ICD-10-CM

## 2023-09-03 DIAGNOSIS — R351 Nocturia: Secondary | ICD-10-CM | POA: Diagnosis not present

## 2023-09-03 LAB — URINALYSIS, ROUTINE W REFLEX MICROSCOPIC
Bilirubin, UA: NEGATIVE
Glucose, UA: NEGATIVE
Ketones, UA: NEGATIVE
Leukocytes,UA: NEGATIVE
Nitrite, UA: NEGATIVE
Protein,UA: NEGATIVE
Specific Gravity, UA: 1.01 (ref 1.005–1.030)
Urobilinogen, Ur: 0.2 mg/dL (ref 0.2–1.0)
pH, UA: 7 (ref 5.0–7.5)

## 2023-09-03 LAB — MICROSCOPIC EXAMINATION
Bacteria, UA: NONE SEEN
WBC, UA: NONE SEEN /HPF (ref 0–5)

## 2023-09-03 LAB — BLADDER SCAN AMB NON-IMAGING: Scan Result: 227

## 2023-09-03 MED ORDER — TAMSULOSIN HCL 0.4 MG PO CAPS: 0.4000 mg | ORAL_CAPSULE | Freq: Every day | ORAL | 11 refills | Status: DC

## 2023-09-03 NOTE — Progress Notes (Unsigned)
 Bladder Scan completed today.  Patient can void prior to the bladder scan. Bladder scan result: [id number]  Performed By: Melvenia Stabs. CMA  Additional notes-

## 2023-09-03 NOTE — Progress Notes (Unsigned)
 Subjective:  1. Jerry Cain, unspecified whether lower urinary tract symptoms present   2. Nocturia   3. Urgency of urination   4. Urge incontinence   5. Incomplete bladder emptying   6. Intermittent urinary stream      Jerry Cain is a 89 year-old male established patient who is here for follow up regarding further evaluation of BPH and lower urinary tract symptoms.  The patient complains of lower urinary tract symptom(s) that include nocturia and sense of incomplete emptying. The patient states his most bothersome symptom(s) are the following: nocturia. His symptoms have been stable over the last year.   07/26/20: Jerry Cain returns today in f/u. He has a several year history of BPH with BOO with an elevated PSA. He remains on finasteride . He remains off of alpha blockers which caused side effects. He is on prn furosemide  and voids well on that. He has variable nocturia 3-5x but feels it is worse than last year. His IPSS is 9. The urgency is only aggravated by the furosemide . His PSA was 1.1 on the finasteride  two years ago and we decided further testing was not indicated. He has no hesitancy and a good stream. He feels like he is emptying well most of the time. He has no hematuria or dysuria. He has a history of  a negative biopsy remotely for the elevated PSA. He has no associated signs or symptoms.   10/18/20: Jerry Cain returns for his history of BPH with BOO.  He remains on finasteride .   His PVR is .  His UA is clear. IPSS is 6.  He has nocturia x 2. He was given silodosin  4mg  at his last visit but he didn't tolerate it and stopped it.   04/25/21: Jerry Cain returns today in f/u.  He remains on finasteride .  He a 6 month history of increased urgency and UUI if he eats too much sodium during the day.  His family had COVID in December.  They all recovered but his wife had some hearing issues but that has resolved.  He has no dysuria or hematuria.  His  PVR is stable at and his IPSS is 22.   10/24/21: Jerry Cain returns for his history of BPH with BOO.  He remains on finasteride .  HIs IPSS is 13.   He has no incontinence.  He has had no hematuria or dysuria.  His PVR is 79ml.   03/06/22: Jerry Cain returns today in f/u for his history of BPH with BOO on finasteride .  His PVR is .  He has some AM frequency q30-4min after his morning lasix  and that will settle by mid day.  He has urgency when he stands and can have some mild UUI.  He has nocturia x 3.  His IPSS is 16.   02/26/23: Jerry Cain returns today in f/u   He has BPH with BOO and remains on finasteride .   He has some increase urgency with UUI on occasion.  His PVR is up to .  His IPSS is 17 with nocturia x 3 and urgency.   His UA is clear.   He is on an antibiotic for his foot.   09/03/23: Jerry Cain returns today in f/u.   He has BPH with BOO and remains on finasteride .  I thought I had sent tamsulosin  at his last visit but he never got it.  His PVR is .  He has a new complaint of hesitancy at night with a gap between  voids.  It started Monday night.  His IPSS is 23 with nocturia x 3.  He has a reduced stream and frequency.  He has no hematuria or dysuria.  UA has trace blood.  He is dealing with a left foot ulcer.   IPSS     Row Name 09/03/23 1000         International Prostate Symptom Score   How often have you had the sensation of not emptying your bladder? Almost always     How often have you had to urinate less than every two hours? Almost always     How often have you found you stopped and started again several times when you urinated? Less than half the time     How often have you found it difficult to postpone urination? About half the time     How often have you had a weak urinary stream? Almost always     How often have you had to strain to start urination? Not at All     How many times did you typically get up at night to urinate? 3 Times     Total  IPSS Score 23                   ROS:  ROS:  A complete review of systems was performed.  All systems are negative except for pertinent findings as noted.   Review of Systems  All other systems reviewed and are negative.   Allergies  Allergen Reactions   Alpha Blocker Quinazolines     Tamsulosin  / Alfuzosin / Silodosin  caused adverse effects (weak urinary stream, increased sensations of incomplete bladder emptying, decreased urine output)   Carbapenems Other (See Comments)    Altered mental status   Cephalosporins     Unknown reaction   Nsaids Other (See Comments)    On blood thinner    Penicillins Swelling and Other (See Comments)    Did it involve swelling of the face/tongue/throat, SOB, or low BP? Unknown-possible swelling Did it involve sudden or severe rash/hives, skin peeling, or any reaction on the inside of your mouth or nose? Unknown Did you need to seek medical attention at a hospital or doctor's office? Unknown When did it last happen? Over 10 years (possible swelling)      If all above answers are "NO", may proceed with cephalosporin use.     Outpatient Encounter Medications as of 09/03/2023  Medication Sig   albuterol  (PROVENTIL ) (2.5 MG/3ML) 0.083% nebulizer solution USE 1 VIAL IN NEBULIZER EVERY 6 HOURS AS NEEDED.   Calcium  Carbonate-Vitamin D  600-200 MG-UNIT TABS Take 1 tablet by mouth 2 (two) times daily.   carvedilol  (COREG ) 25 MG tablet Take 1 tablet (25 mg total) by mouth 2 (two) times daily with a meal.   finasteride  (PROSCAR ) 5 MG tablet Take 1 tablet (5 mg total) by mouth daily.   furosemide  (LASIX ) 20 MG tablet TAKE (1) TABLET BY MOUTH ONCE DAILY.   Glucosamine 750 MG TABS Take 750 mg by mouth 2 (two) times daily.   isosorbide  mononitrate (IMDUR ) 60 MG 24 hr tablet Take 1 tablet (60 mg total) by mouth daily.   levothyroxine  (SYNTHROID ) 175 MCG tablet Take 1 tablet (175 mcg total) by mouth daily before breakfast.   lisinopril  (ZESTRIL ) 20 MG  tablet Take 1 tablet (20 mg total) by mouth daily.   Multiple Vitamin (MULTIVITAMIN) capsule Take 1 capsule by mouth every morning.   Nebulizer MISC 1 Units by Does  not apply route daily.   nitroGLYCERIN  (NITROSTAT ) 0.4 MG SL tablet Place 0.4 mg under the tongue every 5 (five) minutes x 3 doses as needed for chest pain (If no relief after 2nd dose, proceed to ED or call 911).   rosuvastatin  (CRESTOR ) 10 MG tablet Take 1 tablet (10 mg total) by mouth daily.   tamsulosin  (FLOMAX ) 0.4 MG CAPS capsule Take 1 capsule (0.4 mg total) by mouth daily.   warfarin (COUMADIN ) 5 MG tablet TAKE (1/2) TO 1 TABLET BY MOUTH ONCE DAILY OR AS DIRECTED BY COUMADIN  CLINIC.   No facility-administered encounter medications on file as of 09/03/2023.    Past Medical History:  Diagnosis Date   Adenomatous colon polyp 2001   Anxiety    Atrial fibrillation [facility])    takes Coumadin  daily   BPH (Jerry Cain)    takes Proscar  daily   CAD (coronary artery disease) 2009   a. s/p PTCA and stenting of mid-LAD and LCx in 2009 b. NST in 03/2018 showing prior infarct with no current ischemia   Carpal tunnel syndrome    right   Complication of anesthesia    hard to wake up   Diverticulosis 2007   tcs by Dr. Homero Luster   GERD (gastroesophageal reflux disease)    Gout    Hypercholesterolemia    takes Atorvastatin  daily   Hypertension    takes Imdur ,Coreg ,and Lisinopril  daily   Hypothyroidism    takes SYnthroid  daily   Joint pain    Joint swelling    Nocturia    Numbness    right hand pointer and middle finger   Osteoarthritis    left knee   Pneumonia    many,many yrs ago   Tubular adenoma 2001    Past Surgical History:  Procedure Laterality Date   Candida Chalk OSTEOTOMY Right 03/17/2013   Procedure: Candida Chalk OSTEOTOMY;  Surgeon: Dewayne Ford, DPM;  Location: [facility];  Service: Orthopedics;  Laterality: Right;   BUNIONECTOMY Right 03/17/2013   Procedure: BUNIONECTOMY, Arthroplasty 2nd toe right  foot;  Surgeon: Dewayne Ford, DPM;  Location: [facility];  Service: Orthopedics;  Laterality: Right;   CARDIAC CATHETERIZATION  06/11/2007   PTCA X2 in 06/2007:  2 overlapping Cypher stents to LAD (2.5x13 and 3.0x23)   CARDIAC CATHETERIZATION  06/21/2007   Cypher stent 2.5 x 13  to OM branch   CARDIOVERSION  12/12/2011   Procedure: CARDIOVERSION;  Surgeon: Luana Rumple, MD;  Location: [facility] END[facility];  Service: Cardiovascular;  Laterality: N/A;   CATARACT EXTRACTION     bilateral   COLONOSCOPY  2007   Dr. Homero Luster- L sided diverticulosis. Next TCS 2012 due to h/o tubular adenoma.   COLONOSCOPY  02/26/2011   Procedure: COLONOSCOPY;  Surgeon: Suzette Espy, MD;  Location: [facility];  Service: Endoscopy;  Laterality: N/A;  11:05   CORONARY ANGIOPLASTY     x 3    FLEXOR TENOTOMY  Left 03/17/2013   Procedure: PERCUTANEOUS FLEXOR TENOTOMY 3RD TOE LEFT FOOT;  Surgeon: Dewayne Ford, DPM;  Location: [facility];  Service: Orthopedics;  Laterality: Left;   HERNIA REPAIR  2009   left inguinal   rot Left    TEE WITHOUT CARDIOVERSION  12/12/2011   Procedure: TRANSESOPHAGEAL ECHOCARDIOGRAM (TEE);  Surgeon: Luana Rumple, MD;  Location: [facility] ENDOSCOPY;  Service: Cardiovascular;  Laterality: N/A;   successful/sinus rhythm   TOTAL KNEE ARTHROPLASTY Left 06/14/2013   Procedure: TOTAL KNEE ARTHROPLASTY;  Surgeon: Shirlee Dotter, MD;  Location: [facility]  OR;  Service: Orthopedics;  Laterality: Left;    Social History   Socioeconomic History   Marital status: Married    Spouse name: Not on file   Number of children: 4   Years of education: Not on file   Highest education level: Not on file  Occupational History   Occupation: retired  Tobacco Use   Smoking status: Never   Smokeless tobacco: Never  Vaping Use   Vaping status: Never Used  Substance and Sexual Activity   Alcohol  use: No   Drug use: No   Sexual activity: Not on file  Other Topics Concern   Not on file  Social History Narrative    Not on file   Social Drivers of Health   Financial Resource Strain: Not on file  Food Insecurity: Not on file  Transportation Needs: Not on file  Physical Activity: Not on file  Stress: Not on file  Social Connections: Not on file  Intimate Partner Violence: Not on file    Family History  Problem Relation Age of Onset   Pneumonia Father    Heart disease Brother        open heart surgery at age 77   Colon cancer Neg Hx        Objective: Vitals:   09/03/23 1042  BP: 121/69  Pulse: 86      Physical Exam Vitals reviewed.  Constitutional:      Appearance: Normal appearance.  Neurological:     Mental Status: He is alert.     Lab Results:  Results for orders placed or performed in visit on 09/03/23 (from the past 24 hours)  Urinalysis, Routine w reflex microscopic     Status: Abnormal   Collection Time: 09/03/23 10:44 AM  Result Value Ref Range   Specific Gravity, UA 1.010 1.005 - 1.030   pH, UA 7.0 5.0 - 7.5   Color, UA Yellow Yellow   Appearance Ur Clear Clear   Leukocytes,UA Negative Negative   Protein,UA Negative Negative/Trace   Glucose, UA Negative Negative   Ketones, UA Negative Negative   RBC, UA Trace (A) Negative   Bilirubin, UA Negative Negative   Urobilinogen, Ur 0.2 0.2 - 1.0 mg/dL   Nitrite, UA Negative Negative   Microscopic Examination See below:    Narrative   Performed at:  [address] - [facility] [address] [place], [place]  [id number] Lab Director: Liliana Regulus MT, Phone:  [phone]  Microscopic Examination     Status: None   Collection Time: 09/03/23 10:44 AM   Urine  Result Value Ref Range   WBC, UA None seen 0 - 5 /hpf   RBC, Urine 0-2 0 - 2 /hpf   Epithelial Cells (non renal) 0-10 0 - 10 /hpf   Bacteria, UA None seen None seen/Few   Narrative   Performed at:  [address] - [facility] [address] [place], [place]  [id number] Lab Director: Liliana Regulus MT, Phone:  [phone]        BMET No results  for input(s): "NA", "K", "CL", "CO2", "GLUCOSE", "BUN", "CREATININE", "CALCIUM " in the last 72 hours. PSA No results found for: "PSA" No results found for: "TESTOSTERONE"  UA is clear.  Results for orders placed or performed in visit on 09/03/23 (from the past 24 hours)  Urinalysis, Routine w reflex microscopic     Status: Abnormal   Collection Time: 09/03/23 10:44 AM  Result Value Ref Range   Specific Gravity, UA 1.010 1.005 - 1.030  pH, UA 7.0 5.0 - 7.5   Color, UA Yellow Yellow   Appearance Ur Clear Clear   Leukocytes,UA Negative Negative   Protein,UA Negative Negative/Trace   Glucose, UA Negative Negative   Ketones, UA Negative Negative   RBC, UA Trace (A) Negative   Bilirubin, UA Negative Negative   Urobilinogen, Ur 0.2 0.2 - 1.0 mg/dL   Nitrite, UA Negative Negative   Microscopic Examination See below:    Narrative   Performed at:  [address] - [facility] [place] [address] [place], [place]  [id number] Lab Director: Liliana Regulus MT, Phone:  [phone]  Microscopic Examination     Status: None   Collection Time: 09/03/23 10:44 AM   Urine  Result Value Ref Range   WBC, UA None seen 0 - 5 /hpf   RBC, Urine 0-2 0 - 2 /hpf   Epithelial Cells (non renal) 0-10 0 - 10 /hpf   Bacteria, UA None seen None seen/Few   Narrative   Performed at:  [address] - [facility] [address] [place], [place]  [id number] Lab Director: Liliana Regulus MT, Phone:  [phone]         Studies/Results: No results found.  PVR is  Assessment & Plan: BPH with BOO with moderate LUTS.  He has some increased intermittency.   He will continue the finasteride  which was filled on 02/20/23 and I will start him on tamsulosin .   Side effects reviewed.  He will return in 3 months for a PVR.     Meds ordered this encounter  Medications   tamsulosin  (FLOMAX ) 0.4 MG CAPS capsule    Sig: Take 1 capsule (0.4 mg total) by mouth daily.    Dispense:  30 capsule    Refill:  11       Orders Placed This Encounter  Procedures   Microscopic Examination   Urinalysis, Routine w reflex microscopic   BLADDER SCAN AMB NON-IMAGING      Return in about 3 months (around 12/04/2023) for f/u with Dr. Derrick Fling with PVR. Aaron Aas   CC: Manette Section, MD      Homero Luster 09/04/2023 Patient ID: Jeffory Mings, male   DOB: 03-14-1932, 89 y.o.   MRN: [id number] Patient ID: ESEQUIEL KLEINFELTER, male   DOB: [birth year], 89 y.o.   MRN: [id number] Patient ID: TEVIN SHILLINGFORD, male   DOB: [birth year], 89 y.o.   MRN: [id number]

## 2023-09-15 ENCOUNTER — Ambulatory Visit: Attending: Cardiology | Admitting: *Deleted

## 2023-09-15 DIAGNOSIS — I4891 Unspecified atrial fibrillation: Secondary | ICD-10-CM | POA: Diagnosis not present

## 2023-09-15 DIAGNOSIS — Z5181 Encounter for therapeutic drug level monitoring: Secondary | ICD-10-CM

## 2023-09-15 LAB — POCT INR: INR: 2.3 (ref 2.0–3.0)

## 2023-09-15 NOTE — Patient Instructions (Signed)
 Continue warfarin 1 tablet daily except 1/2 tablet on Tuesdays, Thursdays and Saturdays.  Continue greens Recheck in 6 weeks

## 2023-09-23 ENCOUNTER — Ambulatory Visit: Admitting: Family Medicine

## 2023-09-28 ENCOUNTER — Ambulatory Visit (INDEPENDENT_AMBULATORY_CARE_PROVIDER_SITE_OTHER): Admitting: Family Medicine

## 2023-09-28 ENCOUNTER — Encounter: Payer: Self-pay | Admitting: Family Medicine

## 2023-09-28 VITALS — BP 118/67 | HR 77 | Temp 99.7°F | Resp 18 | Ht 71.0 in | Wt 184.7 lb

## 2023-09-28 DIAGNOSIS — R7303 Prediabetes: Secondary | ICD-10-CM

## 2023-09-28 NOTE — Progress Notes (Signed)
   Established Patient Office Visit  Subjective   [name], male    DOB: 07/27/1931  Age: 89 y.o. MRN: [id number]  No chief complaint on file.   HPI  Prediabetes Pt reports he's working on his diet. Cutting back on carbohydrates. Pt is seeing wound care for left foot ulcer. He has wound care HH coming out to his home 3 times a week.    Review of Systems  All other systems reviewed and are negative.    Objective:     There were no vitals taken for this visit.   Physical Exam Vitals and nursing note reviewed.  Constitutional:      Appearance: Normal appearance. He is normal weight.  HENT:     Head: Normocephalic and atraumatic.     Right Ear: External ear normal.     Left Ear: External ear normal.     Nose: Nose normal.     Mouth/Throat:     Mouth: Mucous membranes are moist.     Pharynx: Oropharynx is clear.   Eyes:     Conjunctiva/sclera: Conjunctivae normal.     Pupils: Pupils are equal, round, and reactive to light.    Cardiovascular:     Rate and Rhythm: Normal rate.  Pulmonary:     Effort: Pulmonary effort is normal.  Abdominal:     General: Bowel sounds are normal.   Skin:    General: Skin is warm.     Capillary Refill: Capillary refill takes less than 2 seconds.   Neurological:     General: No focal deficit present.     Mental Status: He is alert and oriented to person, place, and time. Mental status is at baseline.   Psychiatric:        Mood and Affect: Mood normal.        Behavior: Behavior normal.        Thought Content: Thought content normal.        Judgment: Judgment normal.    No results found for any visits on 09/28/23.    The ASCVD Risk score (Arnett DK, et al., 2019) failed to calculate for the following reasons:   The 2019 ASCVD risk score is only valid for ages 50 to 90    Assessment & Plan:   Problem List Items Addressed This Visit   None Prediabetes -     Hemoglobin A1c   Pt here for follow up of  prediabetes. To recheck today.   No follow-ups on file.    Jerry Cain Barrier, MD

## 2023-09-29 ENCOUNTER — Ambulatory Visit: Payer: Self-pay | Admitting: Family Medicine

## 2023-09-29 LAB — HEMOGLOBIN A1C
Est. average glucose Bld gHb Est-mCnc: 126 mg/dL
Hgb A1c MFr Bld: 6 % — ABNORMAL HIGH (ref 4.8–5.6)

## 2023-10-07 NOTE — Progress Notes (Unsigned)
 Cardiology Office Note    Date:  10/08/2023  ID:  Jerry Cain 1932-03-08, MRN [id number] Cardiologist: Jerel Balding, MD    History of Present Illness:    Jerry Cain is a 89 y.o. male with past medical history of CAD (s/p PTCA and stenting of mid-LAD and LCx in 2009, NST in 03/2018 showing prior infarct with no current ischemia, NST in 06/2022 showing no ischemia and a low-risk study), persistent atrial fibrillation (on Coumadin ), trifasicular block, HFrEF, HTN and HLD who presents to the office today for 86-month follow-up.  He was examined by myself in 01/2023 and activity had been more limited given recent callus removal by Podiatry but he denied any specific anginal symptoms. No changes were made to his cardiac medications and he was continued on Coreg  25 mg twice daily, Imdur  60 mg daily, Crestor  10 mg daily, Lasix  20 mg daily, Lisinopril  20 mg daily and Coumadin  for anticoagulation. Was not on ASA given the need for anticoagulation.  In talking with the patient today, he reports overall doing well since his last office visit.  Activity has been more limited given the ulcer along his left foot. He is being followed closely by the wound center for this. Wearing a boot currently. Does experience intermittent swelling due to this but symptoms improve with elevation of his lower extremities and he does report compliance with Lasix  20 mg daily. He continues to do yard work and denies any recent chest pain or dyspnea on exertion with this. No recent palpitations, orthopnea, PND or pitting edema. Remains on Coumadin  for anticoagulation with no reports of melena, hematochezia or hematuria.  Studies Reviewed:   EKG: EKG is ordered today and demonstrates:   EKG Interpretation Date/Time:  Thursday October 08 2023 07:56:23 EDT Ventricular Rate:  74 PR Interval:    QRS Duration:  172 QT Interval:  438 QTC Calculation: 486 R Axis:   -83  Text Interpretation: Atrial fibrillation  Left axis deviation Right bundle branch block Left anterior fasicular block Confirmed by Johnson Grate ([id number]) on 10/08/2023 8:00:26 AM       Echocardiogram: 03/2018 Study Conclusions   - Left ventricle: The cavity size was normal. Wall thickness was    increased in a pattern of moderate LVH. Systolic function was    normal. The estimated ejection fraction was in the range of 60%    to 65%. Wall motion was normal; there were no regional wall    motion abnormalities. The study is not technically sufficient to    allow evaluation of LV diastolic function.  - Aortic valve: Moderately calcified annulus. Trileaflet;    moderately thickened leaflets. Valve area (VTI): 1.79 cm^2. Valve    area (Vmax): 1.79 cm^2. Valve area (Vmean): 1.76 cm^2.  - Mitral valve: Mildly calcified annulus. Mildly thickened leaflets    . There was mild regurgitation.  - Left atrium: The atrium was moderately dilated.  - Right atrium: The atrium was moderately dilated.   NST: 06/2022   Lexiscan  stress showed no EKG changes to suggest ischemia.   Myoview  scan showed thinning with decreased tracer counts in the basal inferior, distal inferior regions and apex. Consistent with probable soft tissue attenuation (diaphragm) and normal perfusion. No ischemia or scar   LVEF 57% with normal wall motion   Overall low risk scan    Risk Assessment/Calculations:    CHA2DS2-VASc Score = 4   This indicates a 4.8% annual risk of stroke. The patient's score is based upon:  CHF History: 0 HTN History: 1 Diabetes History: 0 Stroke History: 0 Vascular Disease History: 1 Age Score: 2 Gender Score: 0    Physical Exam:   VS:  BP 134/72 (BP Location: Left Arm, Cuff Size: Normal)   Pulse 74   Ht 5' 8 (1.727 m)   Wt 188 lb (85.3 kg)   SpO2 96%   BMI 28.59 kg/m    Wt Readings from Last 3 Encounters:  10/08/23 188 lb (85.3 kg)  09/28/23 184 lb 11.2 oz (83.8 kg)  06/23/23 188 lb (85.3 kg)     GEN: Pleasant, elderly  male appearing in no acute distress NECK: No JVD; No carotid bruits CARDIAC: Irregularly irregular, no murmurs, rubs, gallops RESPIRATORY:  Clear to auscultation without rales, wheezing or rhonchi  ABDOMEN: Appears non-distended. No obvious abdominal masses. EXTREMITIES: Boot in place along left foot. No pitting edema.  Distal pedal pulses are 2+ bilaterally.   Assessment and Plan:   1. Coronary artery disease involving native coronary artery of native heart without angina pectoris - He previously underwent PTCA and stenting of mid-LAD and LCx in 2009 and most recent ischemic evaluation was an NST in 06/2022 showing no evidence of ischemia. - He remains active at baseline for his age and denies any recent anginal symptoms. He is not on ASA given the need for anticoagulation. Continue Coreg  25 mg twice daily, Imdur  60 mg daily and Crestor  10 mg daily.  2. Permanent atrial fibrillation (HCC) - He denies any recent palpitations and heart rate is well-controlled in the 70's during today's visit and has been in the 60's to 70's when checked at home. Continue Coreg  25 mg twice daily for rate-control. - He is on Coumadin  for anticoagulation and INR was at 2.3 when checked on 09/15/2023. No reports of active bleeding. He does need an updated CBC with his next set of labs.  3. Chronic heart failure with preserved ejection fraction (HCC) - Echocardiogram in 2019 showed a preserved EF of 60 to 65% with no regional wall motion abnormalities and EF was normal at 57% by NST in 06/2022. He experiences intermittent edema along his left foot but no pitting edema or respiratory issues.  Continue Lasix  20 mg daily. Would not use an SGLT2 inhibitor given the nonhealing wound along his left foot.  4. Essential hypertension - BP is well-controlled at 134/72 during today's visit. Has also been well-controlled when checked at home by review of his BP log. Continue current medical therapy with Coreg  25 mg twice daily,  Lisinopril  20 mg daily and Imdur  60 mg daily. Creatinine was stable at 0.98 when checked in 06/2023.  5. Hyperlipidemia LDL goal <70 - His LDL was at 48 when checked in 2024. Continue current medical therapy with Crestor  10 mg daily.   Signed, Laymon CHRISTELLA Qua, PA-C

## 2023-10-08 ENCOUNTER — Encounter: Payer: Self-pay | Admitting: Student

## 2023-10-08 ENCOUNTER — Ambulatory Visit: Attending: Student | Admitting: Student

## 2023-10-08 VITALS — BP 134/72 | HR 74 | Ht 68.0 in | Wt 188.0 lb

## 2023-10-08 DIAGNOSIS — I5032 Chronic diastolic (congestive) heart failure: Secondary | ICD-10-CM

## 2023-10-08 DIAGNOSIS — I4821 Permanent atrial fibrillation: Secondary | ICD-10-CM | POA: Diagnosis not present

## 2023-10-08 DIAGNOSIS — I10 Essential (primary) hypertension: Secondary | ICD-10-CM | POA: Diagnosis not present

## 2023-10-08 DIAGNOSIS — E785 Hyperlipidemia, unspecified: Secondary | ICD-10-CM

## 2023-10-08 DIAGNOSIS — I2510 Atherosclerotic heart disease of native coronary artery without angina pectoris: Secondary | ICD-10-CM | POA: Diagnosis not present

## 2023-10-08 MED ORDER — FUROSEMIDE 20 MG PO TABS: 20.0000 mg | ORAL_TABLET | Freq: Every day | ORAL | 3 refills | Status: AC

## 2023-10-08 MED ORDER — CARVEDILOL 25 MG PO TABS: 25.0000 mg | ORAL_TABLET | Freq: Two times a day (BID) | ORAL | 3 refills | Status: AC

## 2023-10-08 MED ORDER — ISOSORBIDE MONONITRATE ER 60 MG PO TB24: 60.0000 mg | ORAL_TABLET | Freq: Every day | ORAL | 3 refills | Status: AC

## 2023-10-08 MED ORDER — LISINOPRIL 20 MG PO TABS: 20.0000 mg | ORAL_TABLET | Freq: Every day | ORAL | 3 refills | Status: AC

## 2023-10-08 MED ORDER — ROSUVASTATIN CALCIUM 10 MG PO TABS: 10.0000 mg | ORAL_TABLET | Freq: Every day | ORAL | 3 refills | Status: AC

## 2023-10-08 NOTE — Patient Instructions (Signed)
 Medication Instructions:  Your physician recommends that you continue on your current medications as directed. Please refer to the Current Medication list given to you today.  *If you need a refill on your cardiac medications before your next appointment, please call your pharmacy*  Lab Work: NONE   If you have labs (blood work) drawn today and your tests are completely normal, you will receive your results only by: MyChart Message (if you have MyChart) OR A paper copy in the mail If you have any lab test that is abnormal or we need to change your treatment, we will call you to review the results.  Testing/Procedures: NONE   Follow-Up: At [facility], you and your health needs are our priority.  As part of our continuing mission to provide you with exceptional heart care, our providers are all part of one team.  This team includes your primary Cardiologist (physician) and Advanced Practice Providers or APPs (Physician Assistants and Nurse Practitioners) who all work together to provide you with the care you need, when you need it.  Your next appointment:   6 month(s)  Provider:   You may see Jerel Balding, MD or one of the following Advanced Practice Providers on your designated Care Team:   [place], PA-C  [place]ia [place], [place] Olivia Pavy, [place]     We recommend signing up for the patient portal called MyChart.  Sign up information is provided on this After Visit Summary.  MyChart is used to connect with patients for Virtual Visits (Telemedicine).  Patients are able to view lab/test results, encounter notes, upcoming appointments, etc.  Non-urgent messages can be sent to your provider as well.   To learn more about what you can do with MyChart, go to [email or web address].   Other Instructions Thank you for choosing [facility]!

## 2023-10-27 ENCOUNTER — Ambulatory Visit: Attending: Cardiology | Admitting: *Deleted

## 2023-10-27 DIAGNOSIS — I4891 Unspecified atrial fibrillation: Secondary | ICD-10-CM | POA: Diagnosis not present

## 2023-10-27 DIAGNOSIS — Z5181 Encounter for therapeutic drug level monitoring: Secondary | ICD-10-CM

## 2023-10-27 LAB — POCT INR: INR: 2.8 (ref 2.0–3.0)

## 2023-10-27 NOTE — Patient Instructions (Signed)
 Continue warfarin 1 tablet daily except 1/2 tablet on Tuesdays, Thursdays and Saturdays.  Continue greens Recheck in 6 weeks

## 2023-10-27 NOTE — Progress Notes (Signed)
Please see anticoagulation encounter.

## 2023-11-11 ENCOUNTER — Other Ambulatory Visit: Payer: Self-pay | Admitting: Family Medicine

## 2023-11-11 DIAGNOSIS — E039 Hypothyroidism, unspecified: Secondary | ICD-10-CM

## 2023-11-11 MED ORDER — LEVOTHYROXINE SODIUM 175 MCG PO TABS
175.0000 ug | ORAL_TABLET | Freq: Every day | ORAL | 2 refills | Status: DC
Start: 2023-11-11 — End: 2024-01-28

## 2023-11-11 NOTE — Telephone Encounter (Unsigned)
 Copied from CRM [id number]. Topic: Clinical - Medication Refill >> Nov 11, 2023 11:50 AM Donna BRAVO wrote: Medication: patient would like this to be on automatic refill   levothyroxine  (SYNTHROID ) 175 MCG tablet  Has the patient contacted their pharmacy? Yes Pharmacy stated to call provider they have not received refills  This is the patient's preferred pharmacy:   [facility] [place], [place] - [id number] [place] [address] Phone: [phone] Fax: [phone]  Is this the correct pharmacy for this prescription? Yes If no, delete pharmacy and type the correct one.   Has the prescription been filled recently? Yes  Is the patient out of the medication? Yes  Has the patient been seen for an appointment in the last year OR does the patient have an upcoming appointment? Yes  Can we respond through MyChart? No  Agent: Please be advised that Rx refills may take up to 3 business days. We ask that you follow-up with your pharmacy.

## 2023-11-23 ENCOUNTER — Ambulatory Visit: Admitting: Urology

## 2023-12-08 ENCOUNTER — Encounter (HOSPITAL_COMMUNITY): Payer: Self-pay

## 2023-12-08 ENCOUNTER — Other Ambulatory Visit: Payer: Self-pay

## 2023-12-08 ENCOUNTER — Encounter

## 2023-12-08 ENCOUNTER — Emergency Department (HOSPITAL_COMMUNITY)

## 2023-12-08 ENCOUNTER — Inpatient Hospital Stay (HOSPITAL_COMMUNITY)
Admission: EM | Admit: 2023-12-08 | Discharge: 2023-12-12 | DRG: 854 | Disposition: A | Discharge status: Home Health | Attending: Family Medicine | Admitting: Family Medicine

## 2023-12-08 DIAGNOSIS — E7800 Pure hypercholesterolemia, unspecified: Secondary | ICD-10-CM | POA: Diagnosis present

## 2023-12-08 DIAGNOSIS — Z880 Allergy status to penicillin: Secondary | ICD-10-CM

## 2023-12-08 DIAGNOSIS — E039 Hypothyroidism, unspecified: Secondary | ICD-10-CM | POA: Diagnosis present

## 2023-12-08 DIAGNOSIS — L97529 Non-pressure chronic ulcer of other part of left foot with unspecified severity: Secondary | ICD-10-CM | POA: Diagnosis present

## 2023-12-08 DIAGNOSIS — Z8249 Family history of ischemic heart disease and other diseases of the circulatory system: Secondary | ICD-10-CM | POA: Diagnosis not present

## 2023-12-08 DIAGNOSIS — I482 Chronic atrial fibrillation, unspecified: Secondary | ICD-10-CM | POA: Diagnosis present

## 2023-12-08 DIAGNOSIS — I110 Hypertensive heart disease with heart failure: Secondary | ICD-10-CM | POA: Diagnosis not present

## 2023-12-08 DIAGNOSIS — I2510 Atherosclerotic heart disease of native coronary artery without angina pectoris: Secondary | ICD-10-CM | POA: Diagnosis present

## 2023-12-08 DIAGNOSIS — M869 Osteomyelitis, unspecified: Secondary | ICD-10-CM | POA: Diagnosis present

## 2023-12-08 DIAGNOSIS — A419 Sepsis, unspecified organism: Principal | ICD-10-CM | POA: Diagnosis present

## 2023-12-08 DIAGNOSIS — I10 Essential (primary) hypertension: Secondary | ICD-10-CM | POA: Diagnosis present

## 2023-12-08 DIAGNOSIS — Z886 Allergy status to analgesic agent status: Secondary | ICD-10-CM | POA: Diagnosis not present

## 2023-12-08 DIAGNOSIS — Z79899 Other long term (current) drug therapy: Secondary | ICD-10-CM

## 2023-12-08 DIAGNOSIS — L03032 Cellulitis of left toe: Secondary | ICD-10-CM | POA: Diagnosis present

## 2023-12-08 DIAGNOSIS — Z888 Allergy status to other drugs, medicaments and biological substances status: Secondary | ICD-10-CM

## 2023-12-08 DIAGNOSIS — Z79890 Hormone replacement therapy (postmenopausal): Secondary | ICD-10-CM | POA: Diagnosis not present

## 2023-12-08 DIAGNOSIS — J4489 Other specified chronic obstructive pulmonary disease: Secondary | ICD-10-CM | POA: Diagnosis present

## 2023-12-08 DIAGNOSIS — Z96652 Presence of left artificial knee joint: Secondary | ICD-10-CM | POA: Diagnosis present

## 2023-12-08 DIAGNOSIS — I509 Heart failure, unspecified: Secondary | ICD-10-CM | POA: Diagnosis not present

## 2023-12-08 DIAGNOSIS — Z881 Allergy status to other antibiotic agents status: Secondary | ICD-10-CM

## 2023-12-08 DIAGNOSIS — Z1152 Encounter for screening for COVID-19: Secondary | ICD-10-CM

## 2023-12-08 DIAGNOSIS — K219 Gastro-esophageal reflux disease without esophagitis: Secondary | ICD-10-CM | POA: Diagnosis present

## 2023-12-08 DIAGNOSIS — N400 Enlarged prostate without lower urinary tract symptoms: Secondary | ICD-10-CM | POA: Diagnosis present

## 2023-12-08 DIAGNOSIS — Z7901 Long term (current) use of anticoagulants: Secondary | ICD-10-CM

## 2023-12-08 DIAGNOSIS — R7303 Prediabetes: Secondary | ICD-10-CM | POA: Diagnosis present

## 2023-12-08 DIAGNOSIS — L089 Local infection of the skin and subcutaneous tissue, unspecified: Secondary | ICD-10-CM | POA: Diagnosis not present

## 2023-12-08 DIAGNOSIS — Z955 Presence of coronary angioplasty implant and graft: Secondary | ICD-10-CM | POA: Diagnosis not present

## 2023-12-08 DIAGNOSIS — R7881 Bacteremia: Secondary | ICD-10-CM | POA: Diagnosis not present

## 2023-12-08 DIAGNOSIS — M86079 Acute hematogenous osteomyelitis, unspecified ankle and foot: Secondary | ICD-10-CM | POA: Diagnosis not present

## 2023-12-08 DIAGNOSIS — L0390 Cellulitis, unspecified: Secondary | ICD-10-CM | POA: Diagnosis present

## 2023-12-08 DIAGNOSIS — T148XXA Other injury of unspecified body region, initial encounter: Secondary | ICD-10-CM

## 2023-12-08 LAB — COMPREHENSIVE METABOLIC PANEL WITH GFR
ALT: 26 U/L (ref 0–44)
AST: 25 U/L (ref 15–41)
Albumin: 3.8 g/dL (ref 3.5–5.0)
Alkaline Phosphatase: 60 U/L (ref 38–126)
Anion gap: 11 (ref 5–15)
BUN: 21 mg/dL (ref 8–23)
CO2: 23 mmol/L (ref 22–32)
Calcium: 9.1 mg/dL (ref 8.9–10.3)
Chloride: 103 mmol/L (ref 98–111)
Creatinine, Ser: 1.05 mg/dL (ref 0.61–1.24)
GFR, Estimated: >60 mL/min (ref >60)
Glucose, Bld: 110 mg/dL — ABNORMAL HIGH (ref 70–99)
Potassium: 4.3 mmol/L (ref 3.5–5.1)
Sodium: 137 mmol/L (ref 135–145)
Total Bilirubin: 2.2 mg/dL — ABNORMAL HIGH (ref 0.0–1.2)
Total Protein: 7.1 g/dL (ref 6.5–8.1)

## 2023-12-08 LAB — MAGNESIUM
Magnesium: 1.9 mg/dL (ref 1.7–2.4)
Magnesium: 1.7 mg/dL (ref 1.7–2.4)

## 2023-12-08 LAB — CBC WITH DIFFERENTIAL/PLATELET
Abs Immature Granulocytes: 0.03 K/uL (ref 0.00–0.07)
Basophils Absolute: 0 K/uL (ref 0.0–0.1)
Basophils Relative: 0 %
Eosinophils Absolute: 0 K/uL (ref 0.0–0.5)
Eosinophils Relative: 1 %
HCT: 39.6 % (ref 39.0–52.0)
Hemoglobin: 12.8 g/dL — ABNORMAL LOW (ref 13.0–17.0)
Immature Granulocytes: 0 %
Lymphocytes Relative: 7 %
Lymphs Abs: 0.6 K/uL — ABNORMAL LOW (ref 0.7–4.0)
MCH: 30.6 pg (ref 26.0–34.0)
MCHC: 32.3 g/dL (ref 30.0–36.0)
MCV: 94.7 fL (ref 80.0–100.0)
Monocytes Absolute: 0.1 K/uL (ref 0.1–1.0)
Monocytes Relative: 1 %
Neutro Abs: 8.1 K/uL — ABNORMAL HIGH (ref 1.7–7.7)
Neutrophils Relative %: 91 %
Platelets: 149 K/uL — ABNORMAL LOW (ref 150–400)
RBC: 4.18 MIL/uL — ABNORMAL LOW (ref 4.22–5.81)
RDW: 14.8 % (ref 11.5–15.5)
WBC: 8.8 K/uL (ref 4.0–10.5)
nRBC: 0 % (ref 0.0–0.2)

## 2023-12-08 LAB — URINALYSIS, W/ REFLEX TO CULTURE (INFECTION SUSPECTED)
Bacteria, UA: NONE SEEN
Bilirubin Urine: NEGATIVE
Glucose, UA: NEGATIVE mg/dL
Ketones, ur: NEGATIVE mg/dL
Leukocytes,Ua: NEGATIVE
Nitrite: NEGATIVE
Protein, ur: NEGATIVE mg/dL
Specific Gravity, Urine: 1.008 (ref 1.005–1.030)
pH: 6 (ref 5.0–8.0)

## 2023-12-08 LAB — CBG MONITORING, ED: Glucose-Capillary: 101 mg/dL — ABNORMAL HIGH (ref 70–99)

## 2023-12-08 LAB — GLUCOSE, CAPILLARY
Glucose-Capillary: 128 mg/dL — ABNORMAL HIGH (ref 70–99)
Glucose-Capillary: 103 mg/dL — ABNORMAL HIGH (ref 70–99)

## 2023-12-08 LAB — TSH
TSH: 2.696 u[IU]/mL (ref 0.350–4.500)
TSH: 1.882 u[IU]/mL (ref 0.350–4.500)

## 2023-12-08 LAB — BLOOD GAS, VENOUS
Acid-Base Excess: 4 mmol/L — ABNORMAL HIGH (ref 0.0–2.0)
Bicarbonate: 29.2 mmol/L — ABNORMAL HIGH (ref 20.0–28.0)
Drawn by: 69042
O2 Saturation: 28 %
Patient temperature: 38.2
pCO2, Ven: 47 mmHg (ref 44–60)
pH, Ven: 7.4 (ref 7.25–7.43)
pO2, Ven: <31 mmHg — CL (ref 32–45)

## 2023-12-08 LAB — RESP PANEL BY RT-PCR (RSV, FLU A&B, COVID)  RVPGX2
Influenza A by PCR: NEGATIVE
Influenza B by PCR: NEGATIVE
Resp Syncytial Virus by PCR: NEGATIVE
SARS Coronavirus 2 by RT PCR: NEGATIVE

## 2023-12-08 LAB — PROTIME-INR
INR: 2.2 — ABNORMAL HIGH (ref 0.8–1.2)
Prothrombin Time: 26 s — ABNORMAL HIGH (ref 11.4–15.2)

## 2023-12-08 LAB — PHOSPHORUS: Phosphorus: 2.1 mg/dL — ABNORMAL LOW (ref 2.5–4.6)

## 2023-12-08 LAB — LACTIC ACID, PLASMA
Lactic Acid, Venous: 1.4 mmol/L (ref 0.5–1.9)
Lactic Acid, Venous: 1.2 mmol/L (ref 0.5–1.9)

## 2023-12-08 LAB — TROPONIN I (HIGH SENSITIVITY)
Troponin I (High Sensitivity): 12 ng/L (ref <18)
Troponin I (High Sensitivity): 22 ng/L — ABNORMAL HIGH (ref <18)

## 2023-12-08 LAB — BRAIN NATRIURETIC PEPTIDE: B Natriuretic Peptide: 559 pg/mL — ABNORMAL HIGH (ref 0.0–100.0)

## 2023-12-08 MED ORDER — HYDROMORPHONE HCL 1 MG/ML IJ SOLN
0.5000 mg | INTRAMUSCULAR | Status: DC | PRN
  Administered 2023-12-11 – 2023-12-12 (×4): 0.5 mg via INTRAVENOUS
  Filled 2023-12-08 (×4): qty 0.5

## 2023-12-08 MED ORDER — LACTATED RINGERS IV BOLUS (SEPSIS)
500.0000 mL | Freq: Once | INTRAVENOUS | Status: AC
  Administered 2023-12-08: 500 mL via INTRAVENOUS

## 2023-12-08 MED ORDER — SODIUM CHLORIDE 0.9 % IV SOLN: 250.0000 mL | INTRAVENOUS | Status: DC | PRN

## 2023-12-08 MED ORDER — LEVOFLOXACIN IN D5W 750 MG/150ML IV SOLN
750.0000 mg | Freq: Once | INTRAVENOUS | Status: AC
  Administered 2023-12-08: 750 mg via INTRAVENOUS
  Filled 2023-12-08: qty 150

## 2023-12-08 MED ORDER — FUROSEMIDE 20 MG PO TABS
20.0000 mg | ORAL_TABLET | Freq: Every day | ORAL | Status: DC
  Administered 2023-12-08 – 2023-12-10 (×3): 20 mg via ORAL
  Filled 2023-12-08 (×4): qty 1

## 2023-12-08 MED ORDER — HEPARIN SODIUM (PORCINE) 5000 UNIT/ML IJ SOLN: 5000.0000 [IU] | Freq: Three times a day (TID) | INTRAMUSCULAR | Status: DC

## 2023-12-08 MED ORDER — ACETAMINOPHEN 325 MG PO TABS
650.0000 mg | ORAL_TABLET | Freq: Once | ORAL | Status: AC
  Administered 2023-12-08: 650 mg via ORAL
  Filled 2023-12-08: qty 2

## 2023-12-08 MED ORDER — VANCOMYCIN HCL 1250 MG/250ML IV SOLN: 1250.0000 mg | INTRAVENOUS | Status: DC

## 2023-12-08 MED ORDER — IPRATROPIUM-ALBUTEROL 0.5-2.5 (3) MG/3ML IN SOLN: 3.0000 mL | Freq: Four times a day (QID) | RESPIRATORY_TRACT | Status: DC | PRN

## 2023-12-08 MED ORDER — CARVEDILOL 12.5 MG PO TABS
25.0000 mg | ORAL_TABLET | Freq: Two times a day (BID) | ORAL | Status: DC
  Administered 2023-12-08 – 2023-12-12 (×7): 25 mg via ORAL
  Filled 2023-12-08 (×9): qty 2

## 2023-12-08 MED ORDER — ROSUVASTATIN CALCIUM 10 MG PO TABS
10.0000 mg | ORAL_TABLET | Freq: Every day | ORAL | Status: DC
  Administered 2023-12-08 – 2023-12-10 (×3): 10 mg via ORAL
  Filled 2023-12-08 (×4): qty 1

## 2023-12-08 MED ORDER — FINASTERIDE 5 MG PO TABS
5.0000 mg | ORAL_TABLET | Freq: Every day | ORAL | Status: DC
  Administered 2023-12-08 – 2023-12-12 (×5): 5 mg via ORAL
  Filled 2023-12-08 (×5): qty 1

## 2023-12-08 MED ORDER — BUDESONIDE 0.5 MG/2ML IN SUSP
0.5000 mg | Freq: Two times a day (BID) | RESPIRATORY_TRACT | Status: DC
  Administered 2023-12-08 – 2023-12-12 (×8): 0.5 mg via RESPIRATORY_TRACT
  Filled 2023-12-08 (×8): qty 2

## 2023-12-08 MED ORDER — WARFARIN SODIUM 5 MG PO TABS: 5.0000 mg | ORAL_TABLET | ORAL | Status: DC

## 2023-12-08 MED ORDER — ISOSORBIDE MONONITRATE ER 60 MG PO TB24
60.0000 mg | ORAL_TABLET | Freq: Every day | ORAL | Status: DC
  Administered 2023-12-08 – 2023-12-12 (×5): 60 mg via ORAL
  Filled 2023-12-08 (×5): qty 1

## 2023-12-08 MED ORDER — ACETAMINOPHEN 325 MG PO TABS: 650.0000 mg | ORAL_TABLET | Freq: Four times a day (QID) | ORAL | Status: DC | PRN

## 2023-12-08 MED ORDER — DM-GUAIFENESIN ER 30-600 MG PO TB12
1.0000 | ORAL_TABLET | Freq: Two times a day (BID) | ORAL | Status: DC
  Administered 2023-12-08 – 2023-12-12 (×8): 1 via ORAL
  Filled 2023-12-08 (×9): qty 1

## 2023-12-08 MED ORDER — GADOBUTROL 1 MMOL/ML IV SOLN
8.0000 mL | Freq: Once | INTRAVENOUS | Status: AC | PRN
  Administered 2023-12-08: 8 mL via INTRAVENOUS

## 2023-12-08 MED ORDER — INSULIN ASPART 100 UNIT/ML IJ SOLN: 0.0000 [IU] | Freq: Every day | INTRAMUSCULAR | Status: DC

## 2023-12-08 MED ORDER — ACETAMINOPHEN 650 MG RE SUPP
650.0000 mg | Freq: Four times a day (QID) | RECTAL | Status: DC | PRN
Start: 2023-12-08 — End: 2023-12-12

## 2023-12-08 MED ORDER — WARFARIN SODIUM 2.5 MG PO TABS
2.5000 mg | ORAL_TABLET | ORAL | Status: DC
  Filled 2023-12-08: qty 1

## 2023-12-08 MED ORDER — VANCOMYCIN HCL 1500 MG/300ML IV SOLN: 1500.0000 mg | Freq: Once | INTRAVENOUS | Status: DC

## 2023-12-08 MED ORDER — VANCOMYCIN HCL 1500 MG/300ML IV SOLN
1500.0000 mg | Freq: Once | INTRAVENOUS | Status: AC
  Administered 2023-12-08: 1500 mg via INTRAVENOUS
  Filled 2023-12-08: qty 300

## 2023-12-08 MED ORDER — ONDANSETRON HCL 4 MG/2ML IJ SOLN: 4.0000 mg | Freq: Four times a day (QID) | INTRAMUSCULAR | Status: DC | PRN

## 2023-12-08 MED ORDER — LEVOTHYROXINE SODIUM 50 MCG PO TABS
175.0000 ug | ORAL_TABLET | Freq: Every day | ORAL | Status: DC
  Administered 2023-12-09 – 2023-12-12 (×4): 175 ug via ORAL
  Filled 2023-12-08 (×4): qty 1

## 2023-12-08 MED ORDER — SODIUM CHLORIDE 0.9% FLUSH
3.0000 mL | Freq: Two times a day (BID) | INTRAVENOUS | Status: DC
  Administered 2023-12-08 – 2023-12-09 (×3): 3 mL via INTRAVENOUS

## 2023-12-08 MED ORDER — ONDANSETRON HCL 4 MG PO TABS
4.0000 mg | ORAL_TABLET | Freq: Four times a day (QID) | ORAL | Status: DC | PRN
Start: 2023-12-08 — End: 2023-12-12

## 2023-12-08 MED ORDER — WARFARIN - PHARMACIST DOSING INPATIENT: Freq: Every day | Status: DC

## 2023-12-08 MED ORDER — SODIUM CHLORIDE 0.9% FLUSH: 3.0000 mL | INTRAVENOUS | Status: DC | PRN

## 2023-12-08 MED ORDER — VANCOMYCIN HCL IN DEXTROSE 1-5 GM/200ML-% IV SOLN: 1000.0000 mg | Freq: Once | INTRAVENOUS | Status: DC

## 2023-12-08 MED ORDER — SALINE SPRAY 0.65 % NA SOLN
1.0000 | NASAL | Status: DC | PRN
  Administered 2023-12-08: 1 via NASAL
  Filled 2023-12-08: qty 44

## 2023-12-08 MED ORDER — INSULIN ASPART 100 UNIT/ML IJ SOLN
0.0000 [IU] | Freq: Three times a day (TID) | INTRAMUSCULAR | Status: DC
  Administered 2023-12-08: 1 [IU] via SUBCUTANEOUS

## 2023-12-08 NOTE — Progress Notes (Signed)
 PHARMACY - ANTICOAGULATION CONSULT NOTE  Pharmacy Consult for warfarin Indication: atrial fibrillation  Allergies  Allergen Reactions   Alpha Blocker Quinazolines     Tamsulosin  / Alfuzosin / Silodosin  caused adverse effects (weak urinary stream, increased sensations of incomplete bladder emptying, decreased urine output)   Carbapenems Other (See Comments)    Altered mental status   Cephalosporins     Unknown reaction   Nsaids Other (See Comments)    On blood thinner    Penicillins Swelling and Other (See Comments)    Did it involve swelling of the face/tongue/throat, SOB, or low BP? Unknown-possible swelling Did it involve sudden or severe rash/hives, skin peeling, or any reaction on the inside of your mouth or nose? Unknown Did you need to seek medical attention at a hospital or doctor's office? Unknown When did it last happen? Over 10 years (possible swelling)      If all above answers are NO, may proceed with cephalosporin use.     Patient Measurements: Height: 5' 6 (167.6 cm) Weight: 81.6 kg (180 lb) IBW/kg (Calculated) : 63.8 HEPARIN  DW (KG): 80.3  Vital Signs: Temp: 100.7 F (38.2 C) (09/02 0852) Temp Source: Oral (09/02 0852) BP: 138/110 (09/02 0852) Pulse Rate: 96 (09/02 0852)  Labs: Recent Labs    12/08/23 0904 12/08/23 0910 12/08/23 1050  HGB 12.8*  --   --   HCT 39.6  --   --   PLT 149*  --   --   LABPROT  --  26.0*  --   INR  --  2.2*  --   CREATININE 1.05  --   --   TROPONINIHS 12  --  22*    Estimated Creatinine Clearance: 47.8 mL/min (by C-G formula based on SCr of 1.05 mg/dL).   Medical History: Past Medical History:  Diagnosis Date   Adenomatous colon polyp 2001   Anxiety    Atrial fibrillation (HCC)    takes Coumadin  daily   BPH (benign prostatic hyperplasia)    takes Proscar  daily   CAD (coronary artery disease) 2009   a. s/p PTCA and stenting of mid-LAD and LCx in 2009 b. NST in 03/2018 showing prior infarct with no current  ischemia   Carpal tunnel syndrome    right   Complication of anesthesia    hard to wake up   Diverticulosis 2007   tcs by Dr. Golda   GERD (gastroesophageal reflux disease)    Gout    Hypercholesterolemia    takes Atorvastatin  daily   Hypertension    takes Imdur ,Coreg ,and Lisinopril  daily   Hypothyroidism    takes SYnthroid  daily   Joint pain    Joint swelling    Nocturia    Numbness    right hand pointer and middle finger   Osteoarthritis    left knee   Pneumonia    many,many yrs ago   Tubular adenoma 2001    Medications:  (Not in a hospital admission)   Assessment: Pharmacy consulted to dose warfarin in patient with atrial fibrillation. INR on admission is therapeutic at 2.2.  Home dose listed as 2.5 mg on Tue+Thur+Sat and 5 mg ROW.  Goal of Therapy:  INR 2-3 Monitor platelets by anticoagulation protocol: Yes   Plan:  Continue home regimen of warfarin 2.5 mg every Tue,Thu,Sat and 5 mg ROW. Monitor daily INR and s/s of bleeding  Elspeth Sour, PharmD Clinical Pharmacist 12/08/2023 12:06 PM

## 2023-12-08 NOTE — TOC CM/SW Note (Signed)
 Transition of Care [facility]) - Inpatient Brief Assessment   Patient Details  Name: Jerry Cain MRN: [id number] Date of Birth: 03/01/1932  Transition of Care [facility]) CM/SW Contact:    Noreen KATHEE Pinal, LCSWA Phone Number: 12/08/2023, 11:35 AM   Clinical Narrative:  Transition of Care Department [facility]) has reviewed patient and no TOC needs have been identified at this time. We will continue to monitor patient advancement through interdisciplinary progression rounds. If new patient transition needs arise, please place a TOC consult.   Transition of Care Asessment: Insurance and Status: Insurance coverage has been reviewed Patient has primary care physician: Yes Home environment has been reviewed: Single Family Home Prior level of function:: Independent Prior/Current Home Services: No current home services Social Drivers of Health Review: SDOH reviewed no interventions necessary Readmission risk has been reviewed: Yes Transition of care needs: no transition of care needs at this time

## 2023-12-08 NOTE — Progress Notes (Signed)
 Pharmacy Antibiotic Note  Jerry Cain is a 89 y.o. male admitted on 12/08/2023 with cellulitis.  Pharmacy has been consulted for vancomycin  dosing.  Plan: Vancomycin  1500 mg IV x 1 dose Vancomycin  1250 mg IV every 24 hours. Monitor labs, c/s, and vanco levels as indicated.  Height: 5' 6 (167.6 cm) Weight: 81.6 kg (180 lb) IBW/kg (Calculated) : 63.8  Temp (24hrs), Avg:100.7 F (38.2 C), Min:100.7 F (38.2 C), Max:100.7 F (38.2 C)  Recent Labs  Lab 12/08/23 0904  WBC 8.8  CREATININE 1.05  LATICACIDVEN 1.4    Estimated Creatinine Clearance: 47.8 mL/min (by C-G formula based on SCr of 1.05 mg/dL).    Allergies  Allergen Reactions   Alpha Blocker Quinazolines     Tamsulosin  / Alfuzosin / Silodosin  caused adverse effects (weak urinary stream, increased sensations of incomplete bladder emptying, decreased urine output)   Carbapenems Other (See Comments)    Altered mental status   Cephalosporins     Unknown reaction   Nsaids Other (See Comments)    On blood thinner    Penicillins Swelling and Other (See Comments)    Did it involve swelling of the face/tongue/throat, SOB, or low BP? Unknown-possible swelling Did it involve sudden or severe rash/hives, skin peeling, or any reaction on the inside of your mouth or nose? Unknown Did you need to seek medical attention at a hospital or doctor's office? Unknown When did it last happen? Over 10 years (possible swelling)      If all above answers are NO, may proceed with cephalosporin use.     Antimicrobials this admission: Vanco 9/2 >>  Levaquin  9/2   Microbiology results: 9/2 BCx: pending   Thank you for allowing pharmacy to be a part of this patient's care.  Elspeth Sour, PharmD Clinical Pharmacist 12/08/2023 12:03 PM

## 2023-12-08 NOTE — ED Triage Notes (Signed)
 Pt c/o flue like symptoms, [facility] EMS, O2 sat at 87 in field, placed on 2L O2. Temp checked at bedside at arrival, 100.7 F. Pt denies N/V/D, A&Ox4.

## 2023-12-08 NOTE — Plan of Care (Signed)
   Problem: Activity: Goal: Risk for activity intolerance will decrease Outcome: Progressing   Problem: Coping: Goal: Level of anxiety will decrease Outcome: Progressing

## 2023-12-08 NOTE — Consult Note (Signed)
 PODIATRY CONSULT NOTE  REASON FOR CONSULTATION Infected ulcer of L great toe.  HISTORY OF PRESENT ILLNESS Jerry Cain is a 89 y.o. male presented to ED this AM with chills since last night. I was consulted by Dr. Melvenia this AM. Admission and MRI recommended.  Jerry Cain is well known to me from my private office. He initially presented with an ulcer of the left great toe on 01/27/2023. Serial debridement with local wound care was performed, but the ulceration failed to improve. The patient was referred to [facility] on 04/21/2023. His initial visit with the wound center was on 05/06/2023. His wound has been managed by the wound center since then. Currently, Hydroferon Blue is being applied to the ulceration daily. The patient's wife, daughter, and son are present at bedside.  Past Medical History:  Diagnosis Date   Adenomatous colon polyp 2001   Anxiety    Atrial fibrillation (HCC)    takes Coumadin  daily   BPH (benign prostatic hyperplasia)    takes Proscar  daily   CAD (coronary artery disease) 2009   a. s/p PTCA and stenting of mid-LAD and LCx in 2009 b. NST in 03/2018 showing prior infarct with no current ischemia   Carpal tunnel syndrome    right   Complication of anesthesia    hard to wake up   Diverticulosis 2007   tcs by Dr. Golda   GERD (gastroesophageal reflux disease)    Gout    Hypercholesterolemia    takes Atorvastatin  daily   Hypertension    takes Imdur ,Coreg ,and Lisinopril  daily   Hypothyroidism    takes SYnthroid  daily   Joint pain    Joint swelling    Nocturia    Numbness    right hand pointer and middle finger   Osteoarthritis    left knee   Pneumonia    many,many yrs ago   Tubular adenoma 2001   Scheduled Meds:  budesonide  (PULMICORT ) nebulizer solution  0.5 mg Nebulization BID   carvedilol   25 mg Oral BID WC   dextromethorphan -guaiFENesin   1 tablet Oral BID   finasteride   5 mg Oral Daily   furosemide   20 mg Oral Daily    insulin  aspart  0-5 Units Subcutaneous QHS   insulin  aspart  0-9 Units Subcutaneous TID WC   isosorbide  mononitrate  60 mg Oral Daily   [START ON 12/09/2023] levothyroxine   175 mcg Oral QAC breakfast   rosuvastatin   10 mg Oral Daily   sodium chloride  flush  3 mL Intravenous Q12H   warfarin  2.5 mg Oral Once per day on Tuesday Thursday Saturday   [START ON 12/09/2023] warfarin  5 mg Oral Once per day on Sunday Monday Wednesday Friday   Warfarin - Pharmacist Dosing Inpatient   Does not apply q1600   Continuous Infusions:  sodium chloride      [START ON 12/09/2023] vancomycin      PRN Meds:.sodium chloride , acetaminophen  **OR** acetaminophen , HYDROmorphone  (DILAUDID ) injection, ipratropium-albuterol , ondansetron  **OR** ondansetron  (ZOFRAN ) IV, sodium chloride  flush  Allergies  Allergen Reactions   Alpha Blocker Quinazolines     Tamsulosin  / Alfuzosin / Silodosin  caused adverse effects (weak urinary stream, increased sensations of incomplete bladder emptying, decreased urine output)   Carbapenems Other (See Comments)    Altered mental status   Cephalosporins     Unknown reaction   Nsaids Other (See Comments)    On blood thinner    Penicillins Swelling and Other (See Comments)    Did it involve swelling of  the face/tongue/throat, SOB, or low BP? Unknown-possible swelling Did it involve sudden or severe rash/hives, skin peeling, or any reaction on the inside of your mouth or nose? Unknown Did you need to seek medical attention at a hospital or doctor's office? Unknown When did it last happen? Over 10 years (possible swelling)      If all above answers are NO, may proceed with cephalosporin use.    Past Surgical History:  Procedure Laterality Date   KATRINA OSTEOTOMY Right 03/17/2013   Procedure: KATRINA OSTEOTOMY;  Surgeon: Morene Donley Anon, DPM;  Location: [facility];  Service: Orthopedics;  Laterality: Right;   BUNIONECTOMY Right 03/17/2013   Procedure: BUNIONECTOMY, Arthroplasty 2nd  toe right foot;  Surgeon: Morene Donley Anon, DPM;  Location: [facility];  Service: Orthopedics;  Laterality: Right;   CARDIAC CATHETERIZATION  06/11/2007   PTCA X2 in 06/2007:  2 overlapping Cypher stents to LAD (2.5x13 and 3.0x23)   CARDIAC CATHETERIZATION  06/21/2007   Cypher stent 2.5 x 13  to OM branch   CARDIOVERSION  12/12/2011   Procedure: CARDIOVERSION;  Surgeon: Jerel Balding, MD;  Location: [facility];  Service: Cardiovascular;  Laterality: N/A;   CATARACT EXTRACTION     bilateral   COLONOSCOPY  2007   Dr. Golda- L sided diverticulosis. Next TCS 2012 due to h/o tubular adenoma.   COLONOSCOPY  02/26/2011   Procedure: COLONOSCOPY;  Surgeon: Lamar CHRISTELLA Hollingshead, MD;  Location: [facility];  Service: Endoscopy;  Laterality: N/A;  11:05   CORONARY ANGIOPLASTY     x 3    FLEXOR TENOTOMY  Left 03/17/2013   Procedure: PERCUTANEOUS FLEXOR TENOTOMY 3RD TOE LEFT FOOT;  Surgeon: Morene Donley Anon, DPM;  Location: [facility];  Service: Orthopedics;  Laterality: Left;   HERNIA REPAIR  2009   left inguinal   rot Left    TEE WITHOUT CARDIOVERSION  12/12/2011   Procedure: TRANSESOPHAGEAL ECHOCARDIOGRAM (TEE);  Surgeon: Jerel Balding, MD;  Location: [facility] ENDOSCOPY;  Service: Cardiovascular;  Laterality: N/A;   successful/sinus rhythm   TOTAL KNEE ARTHROPLASTY Left 06/14/2013   Procedure: TOTAL KNEE ARTHROPLASTY;  Surgeon: Maude LELON Right, MD;  Location: [facility] OR;  Service: Orthopedics;  Laterality: Left;   Family History  Problem Relation Age of Onset   Pneumonia Father    Heart disease Brother        open heart surgery at age 46   Colon cancer Neg Hx     SOCIAL HISTORY  reports that he has never smoked. He has never used smokeless tobacco. He reports that he does not drink alcohol  and does not use drugs.  REVIEW OF SYSTEMS Positive for chills, fatigue, dizziness and weakness. Denies nausea or vomiting.  PHYSICAL EXAMINATION Vital signs in last 24 hours:   Temp:  [97.5 F (36.4 C)-100.7  F (38.2 C)] 97.5 F (36.4 C) (09/02 1437) Pulse Rate:  [84-96] 84 (09/02 1437) Resp:  [20-27] 20 (09/02 1437) BP: (100-138)/(55-110) 100/55 (09/02 1437) SpO2:  [95 %-99 %] 99 % (09/02 1437) Weight:  [81.6 kg-83.6 kg] 83.6 kg (09/02 1437)  There is a full thickness ulceration present along the distal plantar aspect of the left hallux with significant surrounding hyperkeratotic tissue. The wound bed is fibrotic. The ulcer does not probe to bone. There is edema of the distal aspect of the left great toe. Dorsalis pedis and posterior tibial pulses are palpable. Flexion contracture at IPJ of L hallux.  LAB/TEST RESULTS   Recent Labs    12/08/23 [phone]  WBC 8.8  HGB 12.8*  HCT 39.6  PLT 149*  NA 137  K 4.3  CL 103  CO2 23  BUN 21  CREATININE 1.05  GLUCOSE 110*  CALCIUM  9.1    Recent Results (from the past 240 hours)  Blood Culture (routine x 2)     Status: None (Preliminary result)   Collection Time: 12/08/23  9:04 AM   Specimen: Blood  Result Value Ref Range Status   Specimen Description BLOOD LEFT ANTECUBITAL  Final   Special Requests   Final    BOTTLES DRAWN AEROBIC AND ANAEROBIC Blood Culture adequate volume Performed at [facility], [address], [place], [place] [address]    Culture PENDING  Incomplete   Report Status PENDING  Incomplete  Blood Culture (routine x 2)     Status: None (Preliminary result)   Collection Time: 12/08/23  9:09 AM   Specimen: Blood  Result Value Ref Range Status   Specimen Description BLOOD BLOOD RIGHT HAND  Final   Special Requests   Final    BOTTLES DRAWN AEROBIC AND ANAEROBIC Blood Culture results may not be optimal due to an inadequate volume of blood received in culture bottles Performed at [facility], [address] [place], [place] [address]    Culture PENDING  Incomplete   Report Status PENDING  Incomplete  Resp panel by RT-PCR (RSV, Flu A&B, Covid) Anterior Nasal Swab     Status: None   Collection Time: 12/08/23  9:34 AM    Specimen: Anterior Nasal Swab  Result Value Ref Range Status   SARS Coronavirus 2 by RT PCR NEGATIVE NEGATIVE Final    Comment: (NOTE) SARS-CoV-2 target nucleic acids are NOT DETECTED.  The SARS-CoV-2 RNA is generally detectable in upper respiratory specimens during the acute phase of infection. The lowest concentration of SARS-CoV-2 viral copies this assay can detect is 138 copies/mL. A negative result does not preclude SARS-Cov-2 infection and should not be used as the sole basis for treatment or other patient management decisions. A negative result may occur with  improper specimen collection/handling, submission of specimen other than nasopharyngeal swab, presence of viral mutation(s) within the areas targeted by this assay, and inadequate number of viral copies(<138 copies/mL). A negative result must be combined with clinical observations, patient history, and epidemiological information. The expected result is Negative.  Fact Sheet for Patients:  BloggerCourse.com  Fact Sheet for Healthcare Providers:  SeriousBroker.it  This test is no t yet approved or cleared by the [place]  FDA and  has been authorized for detection and/or diagnosis of SARS-CoV-2 by FDA under an Emergency Use Authorization (EUA). This EUA will remain  in effect (meaning this test can be used) for the duration of the COVID-19 declaration under Section 564(b)(1) of the Act, 21 U.S.C.section 360bbb-3(b)(1), unless the authorization is terminated  or revoked sooner.       Influenza A by PCR NEGATIVE NEGATIVE Final   Influenza B by PCR NEGATIVE NEGATIVE Final    Comment: (NOTE) The Xpert Xpress SARS-CoV-2/FLU/RSV plus assay is intended as an aid in the diagnosis of influenza from Nasopharyngeal swab specimens and should not be used as a sole basis for treatment. Nasal washings and aspirates are unacceptable for Xpert Xpress  SARS-CoV-2/FLU/RSV testing.  Fact Sheet for Patients: BloggerCourse.com  Fact Sheet for Healthcare Providers: SeriousBroker.it  This test is not yet approved or cleared by the [place]  FDA and has been authorized for detection and/or diagnosis of SARS-CoV-2 by FDA under an Emergency  Use Authorization (EUA). This EUA will remain in effect (meaning this test can be used) for the duration of the COVID-19 declaration under Section 564(b)(1) of the Act, 21 U.S.C. section 360bbb-3(b)(1), unless the authorization is terminated or revoked.     Resp Syncytial Virus by PCR NEGATIVE NEGATIVE Final    Comment: (NOTE) Fact Sheet for Patients: BloggerCourse.com  Fact Sheet for Healthcare Providers: SeriousBroker.it  This test is not yet approved or cleared by the [place]  FDA and has been authorized for detection and/or diagnosis of SARS-CoV-2 by FDA under an Emergency Use Authorization (EUA). This EUA will remain in effect (meaning this test can be used) for the duration of the COVID-19 declaration under Section 564(b)(1) of the Act, 21 U.S.C. section 360bbb-3(b)(1), unless the authorization is terminated or revoked.  Performed at [facility], [address], [place], [place] [address]      MR FOOT LEFT W WO CONTRAST Result Date: 12/08/2023 MR FOOT WITHOUT THEN WITH IV CONTRAST COMPARISON: None. CLINICAL HISTORY: Soft tissue infection suspected. Wound left great toe. PULSE SEQUENCES: Ax T1, Ax T2 FS, Sag T1, Sag T2 FS, Cor STIR, Cor T1 with and without IV contrast FINDINGS: There is a moderate-sized wound identified in the plantar aspect of the great toe. There is no full-thickness tissue loss. There is mild edema in the tuft of the distal phalanx of the great toe which is nonspecific. This could be related to reactive edema or mild early osteomyelitis in the appropriate clinical  setting. No destructive changes are present. 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 There is significant degenerative change in the second and third interphalangeal joints. Musculotendinous structures demonstrate no significant tenosynovitis or tendinosis. There is mild fatty atrophy of the intrinsic muscles foot. No focal drainable collection is present. IMPRESSION: No with mild edema in the tuft of the distal phalanx of the great toe as above. This is likely reactive. Mild early osteomyelitis cannot be entirely excluded. No destructive changes. No drainable collection. Follow-up suggested after conservative management to ensure no significant progression. Moderate degenerative changes in the interphalangeal joints of the first, second and third toes. Electronically signed by: Norleen Satchel MD 12/08/2023 12:46 PM EDT RP Workstation: [id number]   DG Toe Great Left Result Date: 12/08/2023 CLINICAL DATA:  Left great toe has open sore on the and, wound, infection EXAM: LEFT GREAT TOE COMPARISON:  March 20, 2013 foot radiographs FINDINGS: Soft tissue ulceration and swelling at the distal aspect of the first toe. No acute osseous fractures identified. No lytic or blastic osseous lesions. No osseous erosions identified. Degenerative changes of interphalangeal and first MTP joints. IMPRESSION: First toe soft tissue ulceration and swelling with no radiographic evidence for acute osteomyelitis. Electronically Signed   By: Michaeline Blanch M.D.   On: 12/08/2023 11:07   DG Chest Port 1 View Result Date: 12/08/2023 EXAM: 1 VIEW XRAY OF THE CHEST 12/08/2023 08:56:00 AM COMPARISON: [facility] radiograph of chest dated 01/17/2021. CLINICAL HISTORY: Questionable sepsis - evaluate for abnormality. Pt c/o flue like symptoms, [facility] EMS, O2 sat at 87 in field, placed on 2L O2. Temp checked at bedside at arrival, 100.7 F. Pt denies N/V/D, A\T\Ox4. Hx of afib,  CAD, hypertension, pneumonia. Non smoker. FINDINGS: LUNGS AND PLEURA: Mildly prominent reticular opacities present within the periphery of the lungs bilaterally suggesting interstitial edema. No focal pulmonary opacity. No pleural effusion. No pneumothorax. HEART AND MEDIASTINUM: No acute abnormality of the cardiac and mediastinal  silhouettes. BONES AND SOFT TISSUES: No acute osseous abnormality. IMPRESSION: 1. Mildly prominent reticular opacities within the periphery of the lungs bilaterally, suggesting interstitial edema. Electronically signed by: Evalene Coho MD 12/08/2023 09:04 AM EDT RP Workstation: [id number]    ASSESSMENT & PLAN 1. Left great toe ulceration with possible early osteomyelitis:  Recommended partial amputation of the left great toe due to chronic nature of ulceration, failure to heal, clinical position of the toe, and patient's advanced age  Discussed alternative treatment of IV antibiotic therapy for at least six weeks with continued wound care, explaining there are no guarantees of response to this course of treatment  Reviewed potential long-term complications from extended antibiotic use  Patient agreed with partial amputation of toe  Surgery tentatively planned for Thursday or Friday pending PT/INR  CBC with differential, ESR, CRP, PT/INR, and BNP are scheduled for tomorrow at 05:00.  Superficial culture obtained from wound; deep culture will be obtained in OR  Betadine  dressing applied; will change dressing daily until surgery  2. Atrial fibrillation on anticoagulation therapy:  Discontinued Coumadin  which patient has not received since admission  Consulted pharmacy for perioperative anticoagulation management that would allow relatively quick discontinuation once surgery is scheduled  Morene LILLETTE Anon 12/08/2023, 5:11 PM

## 2023-12-08 NOTE — ED Provider Notes (Signed)
 [place] EMERGENCY DEPARTMENT AT [facility] Provider Note   CSN: [id number] Arrival date & time: 12/08/23  9168     Patient presents with: Flu Like Symptoms   Jerry Cain is a 89 y.o. male.   Soft tissue    Patient presents for chills since last night.  Medical history includes HTN, HLD, gout, GERD, BPH, atrial fibrillation, arthritis.  He lives independently at home.  Since last night, he has had chills, dizziness, generalized weakness.  EMS was called to his home today.  When they arrived, he was doing a nebulized breathing treatment.  He states that this is typical for his morning routine.  EMS did find to be hypoxic on room air at 87% SpO2.  He does not wear oxygen at baseline.  He was placed on 2 L of supplemental oxygen.  On arrival, patient endorses feeling cold.  He denies any other current symptoms.    Prior to Admission medications   Medication Sig Start Date End Date Taking? Authorizing Provider  albuterol  (PROVENTIL ) (2.5 MG/3ML) 0.083% nebulizer solution USE 1 VIAL IN NEBULIZER EVERY 6 HOURS AS NEEDED. 04/20/23   Strader, Laymon HERO, PA-C  Calcium  Carbonate-Vitamin D  600-200 MG-UNIT TABS Take 1 tablet by mouth 2 (two) times daily.    [provider]  carvedilol  (COREG ) 25 MG tablet Take 1 tablet (25 mg total) by mouth 2 (two) times daily with a meal. 10/08/23   Strader, [place] M, PA-C  finasteride  (PROSCAR ) 5 MG tablet Take 1 tablet (5 mg total) by mouth daily. 04/14/23   Gerldine Lauraine BROCKS, FNP  furosemide  (LASIX ) 20 MG tablet Take 1 tablet (20 mg total) by mouth daily. 10/08/23   Strader, Laymon HERO, PA-C  Glucosamine 750 MG TABS Take 750 mg by mouth 2 (two) times daily.    [provider]  isosorbide  mononitrate (IMDUR ) 60 MG 24 hr tablet Take 1 tablet (60 mg total) by mouth daily. 10/08/23   Strader, Brittany M, PA-C  levothyroxine  (SYNTHROID ) 175 MCG tablet Take 1 tablet (175 mcg total) by mouth daily before breakfast. 11/11/23   Colette Torrence GRADE, MD  lisinopril  (ZESTRIL ) 20 MG tablet Take 1 tablet (20 mg total) by mouth daily. 10/08/23   Strader, Brittany M, PA-C  Multiple Vitamin (MULTIVITAMIN) capsule Take 1 capsule by mouth every morning.    [provider]  Nebulizer MISC 1 Units by Does not apply route daily. 05/06/18   Strader, Laymon HERO, PA-C  nitroGLYCERIN  (NITROSTAT ) 0.4 MG SL tablet Place 0.4 mg under the tongue every 5 (five) minutes x 3 doses as needed for chest pain (If no relief after 2nd dose, proceed to ED or call 911).    [provider]  rosuvastatin  (CRESTOR ) 10 MG tablet Take 1 tablet (10 mg total) by mouth daily. 10/08/23   Strader, Laymon HERO, PA-C  warfarin (COUMADIN ) 5 MG tablet TAKE (1/2) TO 1 TABLET BY MOUTH ONCE DAILY OR AS DIRECTED BY COUMADIN  CLINIC. 07/31/23   Croitoru, Mihai, MD    Allergies: Alpha blocker quinazolines, Carbapenems, Cephalosporins, Nsaids, and Penicillins    Review of Systems  Constitutional:  Positive for chills and fatigue.  Neurological:  Positive for dizziness and weakness (Generalized).  All other systems reviewed and are negative.   Updated Vital Signs BP (!) 138/110 (BP Location: Left Arm)   Pulse 96   Temp (!) 100.7 F (38.2 C) (Oral)   Resp (!) 27   Ht 5' 6 (1.676 m)   Wt 81.6  kg   SpO2 95%   BMI 29.05 kg/m   Physical Exam Vitals and nursing note reviewed.  Constitutional:      General: He is not in acute distress.    Appearance: Normal appearance. He is well-developed. He is not ill-appearing, toxic-appearing or diaphoretic.  HENT:     Head: Normocephalic and atraumatic.     Right Ear: External ear normal.     Left Ear: External ear normal.     Nose: Nose normal.     Mouth/Throat:     Mouth: Mucous membranes are moist.  Eyes:     Extraocular Movements: Extraocular movements intact.     Conjunctiva/sclera: Conjunctivae normal.  Cardiovascular:     Rate and Rhythm: Normal rate and regular rhythm.  Pulmonary:     Effort: Pulmonary  effort is normal. No respiratory distress.  Abdominal:     General: There is no distension.     Palpations: Abdomen is soft.  Musculoskeletal:        General: No swelling.     Cervical back: Normal range of motion and neck supple.  Skin:    General: Skin is warm and dry.     Coloration: Skin is not jaundiced or pale.  Neurological:     General: No focal deficit present.     Mental Status: He is alert and oriented to person, place, and time.  Psychiatric:        Mood and Affect: Mood normal.        Behavior: Behavior normal.     (all labs ordered are listed, but only abnormal results are displayed) Labs Reviewed  COMPREHENSIVE METABOLIC PANEL WITH GFR - Abnormal; Notable for the following components:      Result Value   Glucose, Bld 110 (*)    Total Bilirubin 2.2 (*)    All other components within normal limits  CBC WITH DIFFERENTIAL/PLATELET - Abnormal; Notable for the following components:   RBC 4.18 (*)    Hemoglobin 12.8 (*)    Platelets 149 (*)    Neutro Abs 8.1 (*)    Lymphs Abs 0.6 (*)    All other components within normal limits  BLOOD GAS, VENOUS - Abnormal; Notable for the following components:   pO2, Ven <31 (*)    Bicarbonate 29.2 (*)    Acid-Base Excess 4.0 (*)    All other components within normal limits  URINALYSIS, W/ REFLEX TO CULTURE (INFECTION SUSPECTED) - Abnormal; Notable for the following components:   Hgb urine dipstick SMALL (*)    All other components within normal limits  BRAIN NATRIURETIC PEPTIDE - Abnormal; Notable for the following components:   B Natriuretic Peptide 559.0 (*)    All other components within normal limits  PROTIME-INR - Abnormal; Notable for the following components:   Prothrombin Time 26.0 (*)    INR 2.2 (*)    All other components within normal limits  CULTURE, BLOOD (ROUTINE X 2)  CULTURE, BLOOD (ROUTINE X 2)  RESP PANEL BY RT-PCR (RSV, FLU A&B, COVID)  RVPGX2  LACTIC ACID, PLASMA  MAGNESIUM   TSH  LACTIC ACID, PLASMA   I-STAT CHEM 8, ED  TROPONIN I (HIGH SENSITIVITY)  TROPONIN I (HIGH SENSITIVITY)    EKG: EKG Interpretation Date/Time:  Tuesday December 08 2023 08:46:02 EDT Ventricular Rate:  95 PR Interval:    QRS Duration:  171 QT Interval:  372 QTC Calculation: 468 R Axis:   -88  Text Interpretation: Atrial fibrillation Ventricular premature complex RBBB and LAFB  Confirmed by Melvenia Motto [phone]) on 12/08/2023 9:20:15 AM  Radiology: [facility] Chest Port 1 View Result Date: 12/08/2023 EXAM: 1 VIEW XRAY OF THE CHEST 12/08/2023 08:56:00 AM COMPARISON: [facility] radiograph of chest dated 01/17/2021. CLINICAL HISTORY: Questionable sepsis - evaluate for abnormality. Pt c/o flue like symptoms, brb EMS, O2 sat at 87 in field, placed on 2L O2. Temp checked at bedside at arrival, 100.7 F. Pt denies N/V/D, A\T\Ox4. Hx of afib, CAD, hypertension, pneumonia. Non smoker. FINDINGS: LUNGS AND PLEURA: Mildly prominent reticular opacities present within the periphery of the lungs bilaterally suggesting interstitial edema. No focal pulmonary opacity. No pleural effusion. No pneumothorax. HEART AND MEDIASTINUM: No acute abnormality of the cardiac and mediastinal silhouettes. BONES AND SOFT TISSUES: No acute osseous abnormality. IMPRESSION: 1. Mildly prominent reticular opacities within the periphery of the lungs bilaterally, suggesting interstitial edema. Electronically signed by: Evalene Coho MD 12/08/2023 09:04 AM EDT RP Workstation: [id number]     Procedures   Medications Ordered in the ED  lactated ringers  bolus 500 mL (500 mLs Intravenous New Bag/Given 12/08/23 0929)  levofloxacin  (LEVAQUIN ) IVPB 750 mg (750 mg Intravenous New Bag/Given 12/08/23 0927)  vancomycin  (VANCOREADY) IVPB 1500 mg/300 mL (has no administration in time range)  acetaminophen  (TYLENOL ) tablet 650 mg (650 mg Oral Given 12/08/23 9076)                                    Medical Decision Making Amount and/or Complexity of Data Reviewed Labs:  ordered. Radiology: ordered.  Risk OTC drugs. Prescription drug management. Decision regarding hospitalization.   This patient presents to the ED for concern of chills, this involves an extensive number of treatment options, and is a complaint that carries with it a high risk of complications and morbidity.  The differential diagnosis includes infection, dehydration, polypharmacy, metabolic derangements   Co morbidities / Chronic conditions that complicate the patient evaluation  HTN, HLD, gout, GERD, BPH, atrial fibrillation, arthritis   Additional history obtained:  Additional history obtained from EMR External records from outside source obtained and reviewed including EMS, patient's family   Lab Tests:  I Ordered, and personally interpreted labs.  The pertinent results include: Normal hemoglobin, no leukocytosis, normal kidney function, normal electrolytes.  BNP is elevated.   Imaging Studies ordered:  I ordered imaging studies including x-ray of chest and left great toe; MRI of left foot I independently visualized and interpreted imaging which showed soft tissue swelling with possible early osteomyelitis I agree with the radiologist interpretation   Cardiac Monitoring: / EKG:  The patient was maintained on a cardiac monitor.  I personally viewed and interpreted the cardiac monitored which showed an underlying rhythm of: Atrial fibrillation   Problem List / ED Course / Critical interventions / Medication management  Patient presenting for chills and generalized weakness since last night.  Found to be hypoxic with EMS.  On arrival in the ED, he does have a low-grade temperature, tachycardia, tachypnea.  He does meet SIRS criteria.  Patient was placed on monitor.  Will continue supplemental oxygen.  Tylenol  ordered for antipyresis.  Workup was initiated to identify source of infection.  Given his hypoxia, pneumonia is high on differential.  Patient has listed allergies to  penicillin, cephalosporins, and carbapenems.  Levaquin  was ordered for empiric treatment of sepsis.  This would cover pneumonia and/or urine source.  IV fluids ordered as well.  Per chart review, patient also has a  chronic wound to left great toe.  Home nursing typically comes to his home 3 days a week for dressing changes.  Because yesterday was a holiday, he has not seen her since Friday.  He also goes to wound clinic every 3 weeks.  He did go to wound clinic a week ago.  On inspection of his toe today, patient has purulent discharge.    Vancomycin  ordered to broaden antibiotics.  I spoke with his podiatrist, Dr. Blinda.  Dr. Blinda will be able to round on him here at [facility].  He did request plain films and MRI imaging of his left foot.  He will plan on seeing the patient today around 5 PM after he finishes in the office.  Patient was admitted for further management. I ordered medication including IV fluids and antibiotics for sepsis; Tylenol  for antipyresis Reevaluation of the patient after these medicines showed that the patient improved I have reviewed the patients home medicines and have made adjustments as needed   Consultations Obtained:  I requested consultation with the podiatrist, Dr. Blinda,  and discussed lab and imaging findings as well as pertinent plan - they recommend: Admission [facility].  Dr. Blinda will see the patient this evening   Social Determinants of Health:  Lives at home with family  CRITICAL CARE Performed by: Bernardino Fireman   Total critical care time: 31 minutes  Critical care time was exclusive of separately billable procedures and treating other patients.  Critical care was necessary to treat or prevent imminent or life-threatening deterioration.  Critical care was time spent personally by me on the following activities: development of treatment plan with patient and/or surrogate as well as nursing, discussions with consultants, evaluation of  patient's response to treatment, examination of patient, obtaining history from patient or surrogate, ordering and performing treatments and interventions, ordering and review of laboratory studies, ordering and review of radiographic studies, pulse oximetry and re-evaluation of patient's condition.     Final diagnoses:  Sepsis without acute organ dysfunction, due to unspecified organism [facility])  Wound infection    ED Discharge Orders     None          Fireman Bernardino, MD 12/08/23 [phone]

## 2023-12-08 NOTE — H&P (Signed)
 History and Physical    Patient: Jerry Cain FMW:[id number] DOB: October 12, 1931 DOA: 12/08/2023 DOS: the patient was seen and examined on 12/08/2023 PCP: Jerry Torrence GRADE, Cain   Patient coming from: Home  Chief Complaint:  Chief Complaint  Patient presents with   Flu Like Symptoms   HPI: Jerry Cain is a 89 y.o. male with medical history significant of BPH, chronic atrial fibrillation on Coumadin , GERD, hyperlipidemia, hypertension, hypothyroidism, history of asthma/COPD and and type 2 diabetes mellitus; who presented to the hospital secondary to general malaise and fever.  Symptoms have been present for the last 24 to 48 hours and worsening.  Patient reports no coughing spells, sick contacts, dysuria, hematuria, melena or hematochezia.  There has not been any focal weaknesses.  Of note, patient with diabetic foot ulcer in his left great toe present for the last couple of weeks and with changes suggesting active cellulitis and concerns for early osteomyelitis.  Fluid resuscitation given, cultures taken and IV antibiotics has been started.  Podiatrist (Dr. Blinda) consulted and will see patient in consultation.  [facility] contacted to place patient in the hospital for further evaluation and management.  Review of Systems: As mentioned in the history of present illness. All other systems reviewed and are negative. Past Medical History:  Diagnosis Date   Adenomatous colon polyp 2001   Anxiety    Atrial fibrillation (HCC)    takes Coumadin  daily   BPH (benign prostatic hyperplasia)    takes Proscar  daily   CAD (coronary artery disease) 2009   a. s/p PTCA and stenting of mid-LAD and LCx in 2009 b. NST in 03/2018 showing prior infarct with no current ischemia   Carpal tunnel syndrome    Cain   Complication of anesthesia    hard to wake up   Diverticulosis 2007   tcs by Dr. Golda   GERD (gastroesophageal reflux disease)    Gout    Hypercholesterolemia    takes Atorvastatin   daily   Hypertension    takes Imdur ,Coreg ,and Lisinopril  daily   Hypothyroidism    takes SYnthroid  daily   Joint pain    Joint swelling    Nocturia    Numbness    Cain hand pointer and middle finger   Osteoarthritis    left knee   Pneumonia    many,many yrs ago   Tubular adenoma 2001   Past Surgical History:  Procedure Laterality Date   Jerry Cain Cain 03/17/2013   Procedure: Jerry Cain;  Surgeon: Jerry Cain, DPM;  Location: [facility] ORS;  Service: Orthopedics;  Laterality: Cain;   BUNIONECTOMY Cain 03/17/2013   Procedure: BUNIONECTOMY, Arthroplasty 2nd toe Cain foot;  Surgeon: Jerry Cain, DPM;  Location: [facility] ORS;  Service: Orthopedics;  Laterality: Cain;   CARDIAC CATHETERIZATION  06/11/2007   PTCA X2 in 06/2007:  2 overlapping Cypher stents to LAD (2.5x13 and 3.0x23)   CARDIAC CATHETERIZATION  06/21/2007   Cypher stent 2.5 x 13  to OM branch   CARDIOVERSION  12/12/2011   Procedure: CARDIOVERSION;  Surgeon: Jerry Balding, Cain;  Location: [facility] ENDOSCOPY;  Service: Cardiovascular;  Laterality: N/A;   CATARACT EXTRACTION     bilateral   COLONOSCOPY  2007   Dr. Golda- Cain sided diverticulosis. Next TCS 2012 due to h/o tubular adenoma.   COLONOSCOPY  02/26/2011   Procedure: COLONOSCOPY;  Surgeon: Jerry CHRISTELLA Hollingshead, Cain;  Location: [facility] ENDO SUITE;  Service: Endoscopy;  Laterality: N/A;  11:05   CORONARY ANGIOPLASTY  x 3    FLEXOR TENOTOMY  Left 03/17/2013   Procedure: PERCUTANEOUS FLEXOR TENOTOMY 3RD TOE LEFT FOOT;  Surgeon: Jerry Cain, DPM;  Location: [facility] ORS;  Service: Orthopedics;  Laterality: Left;   HERNIA REPAIR  2009   left inguinal   rot Left    TEE WITHOUT CARDIOVERSION  12/12/2011   Procedure: TRANSESOPHAGEAL ECHOCARDIOGRAM (TEE);  Surgeon: Jerry Balding, Cain;  Location: [facility] ENDOSCOPY;  Service: Cardiovascular;  Laterality: N/A;   successful/sinus rhythm   TOTAL KNEE ARTHROPLASTY Left 06/14/2013   Procedure: TOTAL KNEE ARTHROPLASTY;   Surgeon: Jerry Cain;  Location[facility] OR;  Service: Orthopedics;  Laterality: Left;   Social History:  reports that he has never smoked. He has never used smokeless tobacco. He reports that he does not drink alcohol  and does not use drugs.  Allergies  Allergen Reactions   Alpha Blocker Quinazolines     Tamsulosin  / Alfuzosin / Silodosin  caused adverse effects (weak urinary stream, increased sensations of incomplete bladder emptying, decreased urine output)   Carbapenems Other (See Comments)    Altered mental status   Cephalosporins     Unknown reaction   Nsaids Other (See Comments)    On blood thinner    Penicillins Swelling and Other (See Comments)    Did it involve swelling of the face/tongue/throat, SOB, or low BP? Unknown-possible swelling Did it involve sudden or severe rash/hives, skin peeling, or any reaction on the inside of your mouth or nose? Unknown Did you need to seek medical attention at a hospital or doctor's office? Unknown When did it last happen? Over 10 years (possible swelling)      If all above answers are NO, may proceed with cephalosporin use.     Family History  Problem Relation Age of Onset   Pneumonia Father    Heart disease Brother        open heart surgery at age 61   Colon cancer Neg Hx     Prior to Admission medications   Medication Sig Start Date End Date Taking? Authorizing Provider  albuterol  (PROVENTIL ) (2.5 MG/3ML) 0.083% nebulizer solution USE 1 VIAL IN NEBULIZER EVERY 6 HOURS AS NEEDED. 04/20/23  Yes Strader, [place] M, PA-C  Calcium  Carbonate-Vitamin D  600-200 MG-UNIT TABS Take 1 tablet by mouth 2 (two) times daily.   Yes Provider, Historical, Cain  carvedilol  (COREG ) 25 MG tablet Take 1 tablet (25 mg total) by mouth 2 (two) times daily with a meal. 10/08/23  Yes Strader, [place] M, PA-C  finasteride  (PROSCAR ) 5 MG tablet Take 1 tablet (5 mg total) by mouth daily. 04/14/23  Yes Gerldine Lauraine BROCKS, FNP  furosemide  (LASIX ) 20 MG tablet Take  1 tablet (20 mg total) by mouth daily. 10/08/23  Yes Strader, Laymon HERO, PA-C  Glucosamine 750 MG TABS Take 750 mg by mouth 2 (two) times daily.   Yes Provider, Historical, Cain  isosorbide  mononitrate (IMDUR ) 60 MG 24 hr tablet Take 1 tablet (60 mg total) by mouth daily. 10/08/23  Yes Strader, Laymon HERO, PA-C  levothyroxine  (SYNTHROID ) 175 MCG tablet Take 1 tablet (175 mcg total) by mouth daily before breakfast. 11/11/23  Yes Rucker, Torrence GRADE, Cain  lisinopril  (ZESTRIL ) 20 MG tablet Take 1 tablet (20 mg total) by mouth daily. 10/08/23  Yes Strader, [place] M, PA-C  Multiple Vitamin (MULTIVITAMIN) capsule Take 1 capsule by mouth every morning.   Yes Provider, Historical, Cain  nitroGLYCERIN  (NITROSTAT ) 0.4 MG SL tablet Place 0.4 mg under the tongue every 5 (  five) minutes x 3 doses as needed for chest pain (If no relief after 2nd dose, proceed to ED or call 911).   Yes Provider, Historical, Cain  rosuvastatin  (CRESTOR ) 10 MG tablet Take 1 tablet (10 mg total) by mouth daily. 10/08/23  Yes Strader, [place] M, PA-C  warfarin (COUMADIN ) 5 MG tablet TAKE (1/2) TO 1 TABLET BY MOUTH ONCE DAILY OR AS DIRECTED BY COUMADIN  CLINIC. Patient taking differently: TAKE (1/2) TO 1 TABLET BY MOUTH ONCE DAILY OR AS DIRECTED BY COUMADIN  CLINIC. Take 1 tablet On Sunday,Monday,Wednesday and Friday. Take 1/2 tablet on Tuesday,Thursday and Saturday 07/31/23  Yes Croitoru, Mihai, Cain  Nebulizer MISC 1 Units by Does not apply route daily. 05/06/18   Johnson Laymon HERO, PA-C    Physical Exam: Vitals:   12/08/23 0841 12/08/23 0852 12/08/23 1248 12/08/23 1437  BP:  (!) 138/110  (!) 100/55  Pulse:  96  84  Resp:  (!) 27  20  Temp:  (!) 100.7 F (38.2 C) 98.3 F (36.8 C) (!) 97.5 F (36.4 C)  TempSrc:  Oral  Oral  SpO2:  95%  99%  Weight: 81.6 kg   83.6 kg  Height: 5' 6 (1.676 m)   5' 11 (1.803 m)   General exam: Alert, awake, oriented x 3; no chest pain, no nausea or vomiting.  Reporting general malaise. Respiratory system: Good  saturation on room air. Cardiovascular system:RRR. No murmurs, rubs, gallops.  No JVD. Gastrointestinal system: Abdomen is nondistended, soft and nontender. No organomegaly or masses felt. Normal bowel sounds heard. Central nervous system: No focal neurological deficits. Extremities: No cyanosis or clubbing.  No edema appreciated. Skin: Left great toe diabetic ulcer with active serosanguineous drainage and cellulitic changes; please refer to epic media for images details. Psychiatry: Judgement and insight appear normal. Mood & affect appropriate.   Data Reviewed: Comprehensive metabolic panel: Sodium 137, potassium 4.3, chloride 103, bicarb 23, BUN 21, creatinine 1.05, normal LFTs and GFR >60 CBC: WBCs 8.8, hemoglobin 12.8 and platelet count 149K BNP: 559 PT/INR: 26.0/2.2 respectively UA: Demonstrating a specific gravity 1.008, negative ketones, negative nitrites and no leukocyte esterase.   Assessment and Plan: 1-early sepsis due to diabetic foot ulcer/cellulitis - Continue supportive care and maintain adequate hydration - Lactic acid within normal limit - IV antibiotic has been started - X-ray and MRI has been ordered - Podiatry service has been contacted - Follow further recommendations and clinical response - Antipyretics and analgesics will be provided.  2-type 2 diabetes - Follow CBG fluctuation and adjust hypoglycemic regimen as needed - Holding oral agents while inpatient - Sliding scale insulin  has been ordered.  3-chronic atrial fibrillation - Continue beta-blocker for rate control - Continue Coumadin  per pharmacy for secondary prevention. -Telemetry monitoring has been ordered  4-hypertension - Continue home antihypertensive agents and follow vital signs.  5-hyperlipidemia - Continue statin.  6-hypothyroidism - Continue Synthroid  - Update TSH  7-history of asthma/COPD - Continue as needed bronchodilator management - Pulmicort  has been started - Will provide  mucolytic's - No frank exacerbation currently appreciated.  8-history of BPH - Continue treatment with Proscar .     Advance Care Planning:   Code Status: Full Code   Consults: Podiatrist  Family Communication: No family at bedside.  Severity of Illness: The appropriate patient status for this patient is INPATIENT. Inpatient status is judged to be reasonable and necessary in order to provide the required intensity of service to ensure the patient's safety. The patient's presenting symptoms, physical exam  findings, and initial radiographic and laboratory data in the context of their chronic comorbidities is felt to place them at high risk for further clinical deterioration. Furthermore, it is not anticipated that the patient will be medically stable for discharge from the hospital within 2 midnights of admission.   * I certify that at the point of admission it is my clinical judgment that the patient will require inpatient hospital care spanning beyond 2 midnights from the point of admission due to high intensity of service, high risk for further deterioration and high frequency of surveillance required.*  Author: Eric Nunnery, Cain 12/08/2023 5:31 PM  For on call review [email or web address].

## 2023-12-09 ENCOUNTER — Telehealth (HOSPITAL_COMMUNITY): Payer: Self-pay | Admitting: Pharmacy Technician

## 2023-12-09 ENCOUNTER — Other Ambulatory Visit (HOSPITAL_COMMUNITY): Payer: Self-pay

## 2023-12-09 DIAGNOSIS — L03032 Cellulitis of left toe: Secondary | ICD-10-CM | POA: Diagnosis not present

## 2023-12-09 LAB — GLUCOSE, CAPILLARY
Glucose-Capillary: 94 mg/dL (ref 70–99)
Glucose-Capillary: 117 mg/dL — ABNORMAL HIGH (ref 70–99)
Glucose-Capillary: 107 mg/dL — ABNORMAL HIGH (ref 70–99)
Glucose-Capillary: 133 mg/dL — ABNORMAL HIGH (ref 70–99)

## 2023-12-09 LAB — BLOOD CULTURE ID PANEL (REFLEXED) - BCID2
A.calcoaceticus-baumannii: NOT DETECTED
Bacteroides fragilis: NOT DETECTED
CTX-M ESBL: NOT DETECTED
Candida albicans: NOT DETECTED
Candida auris: NOT DETECTED
Candida glabrata: NOT DETECTED
Candida krusei: NOT DETECTED
Candida parapsilosis: NOT DETECTED
Candida tropicalis: NOT DETECTED
Carbapenem resist OXA 48 LIKE: NOT DETECTED
Carbapenem resistance IMP: NOT DETECTED
Carbapenem resistance KPC: NOT DETECTED
Carbapenem resistance NDM: NOT DETECTED
Carbapenem resistance VIM: NOT DETECTED
Cryptococcus neoformans/gattii: NOT DETECTED
Enterobacter cloacae complex: DETECTED — AB
Enterobacterales: DETECTED — AB
Enterococcus Faecium: NOT DETECTED
Enterococcus faecalis: NOT DETECTED
Escherichia coli: NOT DETECTED
Haemophilus influenzae: NOT DETECTED
Klebsiella aerogenes: DETECTED — AB
Klebsiella oxytoca: NOT DETECTED
Klebsiella pneumoniae: NOT DETECTED
Listeria monocytogenes: NOT DETECTED
Neisseria meningitidis: NOT DETECTED
Proteus species: NOT DETECTED
Pseudomonas aeruginosa: NOT DETECTED
Salmonella species: NOT DETECTED
Serratia marcescens: NOT DETECTED
Staphylococcus aureus (BCID): NOT DETECTED
Staphylococcus epidermidis: NOT DETECTED
Staphylococcus lugdunensis: NOT DETECTED
Staphylococcus species: NOT DETECTED
Stenotrophomonas maltophilia: NOT DETECTED
Streptococcus agalactiae: NOT DETECTED
Streptococcus pneumoniae: NOT DETECTED
Streptococcus pyogenes: NOT DETECTED
Streptococcus species: NOT DETECTED

## 2023-12-09 LAB — BASIC METABOLIC PANEL WITH GFR
Anion gap: 9 (ref 5–15)
BUN: 26 mg/dL — ABNORMAL HIGH (ref 8–23)
CO2: 22 mmol/L (ref 22–32)
Calcium: 8.4 mg/dL — ABNORMAL LOW (ref 8.9–10.3)
Chloride: 106 mmol/L (ref 98–111)
Creatinine, Ser: 1.09 mg/dL (ref 0.61–1.24)
GFR, Estimated: >60 mL/min (ref >60)
Glucose, Bld: 94 mg/dL (ref 70–99)
Potassium: 3.1 mmol/L — ABNORMAL LOW (ref 3.5–5.1)
Sodium: 137 mmol/L (ref 135–145)

## 2023-12-09 LAB — CBC WITH DIFFERENTIAL/PLATELET
Abs Immature Granulocytes: 0.03 K/uL (ref 0.00–0.07)
Basophils Absolute: 0 K/uL (ref 0.0–0.1)
Basophils Relative: 0 %
Eosinophils Absolute: 0 K/uL (ref 0.0–0.5)
Eosinophils Relative: 0 %
HCT: 31.9 % — ABNORMAL LOW (ref 39.0–52.0)
Hemoglobin: 10.6 g/dL — ABNORMAL LOW (ref 13.0–17.0)
Immature Granulocytes: 0 %
Lymphocytes Relative: 6 %
Lymphs Abs: 0.7 K/uL (ref 0.7–4.0)
MCH: 31.1 pg (ref 26.0–34.0)
MCHC: 33.2 g/dL (ref 30.0–36.0)
MCV: 93.5 fL (ref 80.0–100.0)
Monocytes Absolute: 0.6 K/uL (ref 0.1–1.0)
Monocytes Relative: 6 %
Neutro Abs: 9.4 K/uL — ABNORMAL HIGH (ref 1.7–7.7)
Neutrophils Relative %: 88 %
Platelets: 131 K/uL — ABNORMAL LOW (ref 150–400)
RBC: 3.41 MIL/uL — ABNORMAL LOW (ref 4.22–5.81)
RDW: 15.1 % (ref 11.5–15.5)
WBC: 10.7 K/uL — ABNORMAL HIGH (ref 4.0–10.5)
nRBC: 0 % (ref 0.0–0.2)

## 2023-12-09 LAB — C-REACTIVE PROTEIN: CRP: 7.5 mg/dL — ABNORMAL HIGH (ref <1.0)

## 2023-12-09 LAB — PROTIME-INR
INR: 2.9 — ABNORMAL HIGH (ref 0.8–1.2)
Prothrombin Time: 31.3 s — ABNORMAL HIGH (ref 11.4–15.2)

## 2023-12-09 LAB — SEDIMENTATION RATE: Sed Rate: 20 mm/h (ref 0–20)

## 2023-12-09 MED ORDER — LEVOFLOXACIN IN D5W 750 MG/150ML IV SOLN
750.0000 mg | INTRAVENOUS | Status: DC
  Administered 2023-12-09 – 2023-12-12 (×4): 750 mg via INTRAVENOUS
  Filled 2023-12-09 (×4): qty 150

## 2023-12-09 NOTE — Hospital Course (Signed)
 Jerry Cain is a 89 y.o. male with medical history significant of BPH, chronic atrial fibrillation on Coumadin , GERD, hyperlipidemia, hypertension, hypothyroidism, history of asthma/COPD and and type 2 diabetes mellitus; who presented to the hospital secondary to general malaise and fever.  Symptoms have been present for the last 24 to 48 hours and worsening.  Patient reports no coughing spells, sick contacts, dysuria, hematuria, melena or hematochezia.  There has not been any focal weaknesses.   Of note, patient with diabetic foot ulcer in his left great toe present for the last couple of weeks and with changes suggesting active cellulitis and concerns for early osteomyelitis.   Fluid resuscitation given, cultures taken and IV antibiotics has been started.  Podiatrist (Dr. Blinda) consulted and will see patient in consultation.  [facility] contacted to place patient in the hospital for further evaluation and management. -------------------------------------------------------------------------  Assessment and Plan:   Early sepsis due to diabetic foot ulcer/cellulitis - Continue supportive care and maintain adequate hydration - Lactic acid within normal limit - IV antibiotic has been started - X-ray and MRI has been ordered - Podiatry service has been contacted - Follow further recommendations and clinical response - Antipyretics and analgesics will be provided.   2-type 2 diabetes - Follow CBG fluctuation and adjust hypoglycemic regimen as needed - Holding oral agents while inpatient - Sliding scale insulin  has been ordered.   3-chronic atrial fibrillation - Continue beta-blocker for rate control - Continue Coumadin  per pharmacy for secondary prevention. -Telemetry monitoring has been ordered   4-hypertension - Continue home antihypertensive agents and follow vital signs.   5-hyperlipidemia - Continue statin.   6-hypothyroidism - Continue Synthroid  - Update TSH   7-history  of asthma/COPD - Continue as needed bronchodilator management - Pulmicort  has been started - Will provide mucolytic's - No frank exacerbation currently appreciated.   8-history of BPH - Continue treatment with Proscar .

## 2023-12-09 NOTE — Progress Notes (Signed)
 PHARMACY - ANTICOAGULATION CONSULT NOTE  Pharmacy Consult for anticoagulation managementIndication: atrial fibrillation  Allergies  Allergen Reactions   Alpha Blocker Quinazolines     Tamsulosin  / Alfuzosin / Silodosin  caused adverse effects (weak urinary stream, increased sensations of incomplete bladder emptying, decreased urine output)   Carbapenems Other (See Comments)    Altered mental status   Cephalosporins     Unknown reaction   Nsaids Other (See Comments)    On blood thinner    Penicillins Swelling and Other (See Comments)    Did it involve swelling of the face/tongue/throat, SOB, or low BP? Unknown-possible swelling Did it involve sudden or severe rash/hives, skin peeling, or any reaction on the inside of your mouth or nose? Unknown Did you need to seek medical attention at a hospital or doctor's office? Unknown When did it last happen? Over 10 years (possible swelling)      If all above answers are NO, may proceed with cephalosporin use.     Patient Measurements: Height: 5' 11 (180.3 cm) Weight: 81.6 kg (179 lb 14.4 oz) IBW/kg (Calculated) : 75.3 HEPARIN  DW (KG): 83.6  Vital Signs: Temp: 98.2 F (36.8 C) (09/03 0517) Temp Source: Oral (09/03 0517) BP: 111/63 (09/03 0517) Pulse Rate: 92 (09/03 0517)  Labs: Recent Labs    12/08/23 0904 12/08/23 0910 12/08/23 1050 12/09/23 0345  HGB 12.8*  --   --  10.6*  HCT 39.6  --   --  31.9*  PLT 149*  --   --  131*  LABPROT  --  26.0*  --  31.3*  INR  --  2.2*  --  2.9*  CREATININE 1.05  --   --  1.09  TROPONINIHS 12  --  22*  --     Estimated Creatinine Clearance: 48.9 mL/min (by C-G formula based on SCr of 1.09 mg/dL).   Medical History: Past Medical History:  Diagnosis Date   Adenomatous colon polyp 2001   Anxiety    Atrial fibrillation (HCC)    takes Coumadin  daily   BPH (benign prostatic hyperplasia)    takes Proscar  daily   CAD (coronary artery disease) 2009   a. s/p PTCA and stenting of mid-LAD  and LCx in 2009 b. NST in 03/2018 showing prior infarct with no current ischemia   Carpal tunnel syndrome    right   Complication of anesthesia    hard to wake up   Diverticulosis 2007   tcs by Dr. Golda   GERD (gastroesophageal reflux disease)    Gout    Hypercholesterolemia    takes Atorvastatin  daily   Hypertension    takes Imdur ,Coreg ,and Lisinopril  daily   Hypothyroidism    takes SYnthroid  daily   Joint pain    Joint swelling    Nocturia    Numbness    right hand pointer and middle finger   Osteoarthritis    left knee   Pneumonia    many,many yrs ago   Tubular adenoma 2001    Assessment: Pharmacy consulted to dose warfarin in patient with atrial fibrillation. INR on admission is therapeutic at 2.2 now up to 2.9. Warfarin was not given, patient did receive levofloxacin  which can potentiate INR. Discussion had with podiatry last night regarding anticoagulation. Current plan is to hold warfarin for now, start heparin  if INR drifts below 2 prior to surgery which is planned for later this week.   Home dose listed as 2.5 mg on Tue+Thur+Sat and 5 mg ROW.  Goal of Therapy:  INR 2-3 Monitor platelets by anticoagulation protocol: Yes   Plan:  Hold warfarin for now Will check INR daily, if it drifts below 2 will plan to start IV heparin .   Dempsey Blush PharmD., BCPS Clinical Pharmacist 12/09/2023 8:53 AM

## 2023-12-09 NOTE — Progress Notes (Signed)
 PHARMACY - PHYSICIAN COMMUNICATION CRITICAL VALUE ALERT - BLOOD CULTURE IDENTIFICATION (BCID)  Jerry Cain is an 89 y.o. male who presented to [facility] on 12/08/2023 with a chief complaint of malaise and flu like symptoms.   Assessment:  89 year old male with diabetic foot infection, possible early osteo. Podiatry is consulted and is planning surgery later this week.   Name of physician (or Provider) Contacted: Shahmedhi  Current antibiotics: vanc  Changes to prescribed antibiotics recommended:  Recommendations accepted by provider Will change to levaquin  based on allergies  Results for orders placed or performed during the hospital encounter of 12/08/23  Blood Culture ID Panel (Reflexed) (Collected: 12/08/2023  9:09 AM)  Result Value Ref Range   Enterococcus faecalis NOT DETECTED NOT DETECTED   Enterococcus Faecium NOT DETECTED NOT DETECTED   Listeria monocytogenes NOT DETECTED NOT DETECTED   Staphylococcus species NOT DETECTED NOT DETECTED   Staphylococcus aureus (BCID) NOT DETECTED NOT DETECTED   Staphylococcus epidermidis NOT DETECTED NOT DETECTED   Staphylococcus lugdunensis NOT DETECTED NOT DETECTED   Streptococcus species NOT DETECTED NOT DETECTED   Streptococcus agalactiae NOT DETECTED NOT DETECTED   Streptococcus pneumoniae NOT DETECTED NOT DETECTED   Streptococcus pyogenes NOT DETECTED NOT DETECTED   A.calcoaceticus-baumannii NOT DETECTED NOT DETECTED   Bacteroides fragilis NOT DETECTED NOT DETECTED   Enterobacterales DETECTED (A) NOT DETECTED   Enterobacter cloacae complex DETECTED (A) NOT DETECTED   Escherichia coli NOT DETECTED NOT DETECTED   Klebsiella aerogenes DETECTED (A) NOT DETECTED   Klebsiella oxytoca NOT DETECTED NOT DETECTED   Klebsiella pneumoniae NOT DETECTED NOT DETECTED   Proteus species NOT DETECTED NOT DETECTED   Salmonella species NOT DETECTED NOT DETECTED   Serratia marcescens NOT DETECTED NOT DETECTED   Haemophilus influenzae NOT  DETECTED NOT DETECTED   Neisseria meningitidis NOT DETECTED NOT DETECTED   Pseudomonas aeruginosa NOT DETECTED NOT DETECTED   Stenotrophomonas maltophilia NOT DETECTED NOT DETECTED   Candida albicans NOT DETECTED NOT DETECTED   Candida auris NOT DETECTED NOT DETECTED   Candida glabrata NOT DETECTED NOT DETECTED   Candida krusei NOT DETECTED NOT DETECTED   Candida parapsilosis NOT DETECTED NOT DETECTED   Candida tropicalis NOT DETECTED NOT DETECTED   Cryptococcus neoformans/gattii NOT DETECTED NOT DETECTED   CTX-M ESBL NOT DETECTED NOT DETECTED   Carbapenem resistance IMP NOT DETECTED NOT DETECTED   Carbapenem resistance KPC NOT DETECTED NOT DETECTED   Carbapenem resistance NDM NOT DETECTED NOT DETECTED   Carbapenem resist OXA 48 LIKE NOT DETECTED NOT DETECTED   Carbapenem resistance VIM NOT DETECTED NOT DETECTED   Dempsey Blush PharmD., BCPS Clinical Pharmacist 12/09/2023 11:39 AM

## 2023-12-09 NOTE — Progress Notes (Signed)
 PROGRESS NOTE    Patient: Jerry Cain                            PCP: Colette Torrence GRADE, MD                    DOB: 18-Dec-1931            DOA: 12/08/2023 FMW:[id number]             DOS: 12/09/2023, 11:33 AM   LOS: 1 day   Date of Service: The patient was seen and examined on 12/09/2023  Subjective:   The patient was seen and examined this morning. Hemodynamically stable. No issues overnight .  Brief Narrative:   Jerry Cain is a 89 y.o. male with medical history significant of BPH, chronic atrial fibrillation on Coumadin , GERD, hyperlipidemia, hypertension, hypothyroidism, history of asthma/COPD and and type 2 diabetes mellitus; who presented to the hospital secondary to general malaise and fever.  Symptoms have been present for the last 24 to 48 hours and worsening.  Patient reports no coughing spells, sick contacts, dysuria, hematuria, melena or hematochezia.  There has not been any focal weaknesses.   Of note, patient with diabetic foot ulcer in his left great toe present for the last couple of weeks and with changes suggesting active cellulitis and concerns for early osteomyelitis.   Fluid resuscitation given, cultures taken and IV antibiotics has been started.  Podiatrist (Dr. Blinda) consulted and will see patient in consultation.    Assessment & Plan:   Principal Problem:   Cellulitis Prediabetic Chronic atrial fibrillation, anticoagulated Hypertension Hyperlipidemia Thyroidism COPD   Assessment and Plan:   Early sepsis due to diabetic foot ulcer/cellulitis POA-met SIRS, early sepsis criteria-source of infection diabetic toe ulcer with cellulitis Currently stable, normotensive afebrile, lactic acid within normal limits  -monitor IV fluids, IV antibiotics - MRI of foot: No with mild edema in the tuft of the distal phalanx of the great toe as above. This is likely reactive. Mild early osteomyelitis cannot be entirely exclude - Podiatry service has  been contacted - Currently on IV vancomycin  >>> per culture switching to Levaquin   Podiatrist (Dr. Blinda) consulted and will see patient in consultation.    H/o type 2 diabetes Per patient not diabetic, Prediabetic -not on any medications at home _ A1c: 5.8 ---6.0  - Continue to check CBG q. ACHS, SI coverage    Chronic atrial fibrillation - Continue beta-blocker for rate control - Continue Coumadin  per pharmacy for secondary prevention. Holding Coumadin  in anticipation of further debridement -INR 2.2 - Evaluating patient for alternative DOAC   Hypertension - Continue home antihypertensive agents and follow vital signs.   Hyperlipidemia - Continue statin.   Hypothyroidism - Continue Synthroid  - Update TSH   History of asthma/COPD - Continue as needed bronchodilator management - Pulmicort  has been started - Will provide mucolytic's - No frank exacerbation currently appreciated.   History of BPH - Continue treatment with Proscar .      --------------------------------------------------------------------------------------------------------------------------- Nutritional status:  The patient's BMI is: Body mass index is 25.09 kg/m. I agree with the assessment and plan as outlined  Nutrition Status:     -------------------------------------------------------------------------------------------------------------------------- Cultures; Blood Cultures x 2 >> Enterobacter, Klebsiella Repeating blood cultures    ---------------------------------------------------------------------------------------------------------------------------  DVT prophylaxis:  Place and maintain sequential compression device Start: 12/08/23 1900   Code Status:   Code Status: Full Code  Family Communication: No family  member present at bedside-  -Advance care planning has been discussed.   Admission status:   Status is: Inpatient Remains inpatient appropriate because: Needing IV  antibiotics-consult evaluation for further evaluation for surgical intervention   Disposition: From  - home             Planning for discharge in 1-2 days   Procedures:   No admission procedures for hospital encounter.   Antimicrobials:  Anti-infectives (From admission, onward)    Start     Dose/Rate Route Frequency Ordered Stop   12/09/23 1300  vancomycin  (VANCOREADY) IVPB 1250 mg/250 mL        1,250 mg 166.7 mL/hr over 90 Minutes Intravenous Every 24 hours 12/08/23 1202     12/08/23 1300  vancomycin  (VANCOREADY) IVPB 1500 mg/300 mL        1,500 mg 150 mL/hr over 120 Minutes Intravenous  Once 12/08/23 1148 12/08/23 1450   12/08/23 1030  vancomycin  (VANCOCIN ) IVPB 1000 mg/200 mL premix  Status:  Discontinued        1,000 mg 200 mL/hr over 60 Minutes Intravenous  Once 12/08/23 1021 12/08/23 1022   12/08/23 1030  vancomycin  (VANCOREADY) IVPB 1500 mg/300 mL  Status:  Discontinued        1,500 mg 150 mL/hr over 120 Minutes Intravenous  Once 12/08/23 1024 12/08/23 1155   12/08/23 0900  levofloxacin  (LEVAQUIN ) IVPB 750 mg        750 mg 100 mL/hr over 90 Minutes Intravenous  Once 12/08/23 0855 12/08/23 1228        Medication:   budesonide  (PULMICORT ) nebulizer solution  0.5 mg Nebulization BID   carvedilol   25 mg Oral BID WC   dextromethorphan -guaiFENesin   1 tablet Oral BID   finasteride   5 mg Oral Daily   furosemide   20 mg Oral Daily   insulin  aspart  0-5 Units Subcutaneous QHS   insulin  aspart  0-9 Units Subcutaneous TID WC   isosorbide  mononitrate  60 mg Oral Daily   levothyroxine   175 mcg Oral QAC breakfast   rosuvastatin   10 mg Oral Daily   sodium chloride  flush  3 mL Intravenous Q12H    acetaminophen  **OR** acetaminophen , HYDROmorphone  (DILAUDID ) injection, ipratropium-albuterol , ondansetron  **OR** ondansetron  (ZOFRAN ) IV, sodium chloride , sodium chloride  flush   Objective:   Vitals:   12/08/23 2144 12/09/23 0207 12/09/23 0517 12/09/23 0658  BP: 121/78 (!)  109/54 111/63   Pulse: 88 89 92   Resp:      Temp: 98.4 F (36.9 C) 98.7 F (37.1 C) 98.2 F (36.8 C)   TempSrc: Oral Oral Oral   SpO2: 93% 92% 95% 93%  Weight:   81.6 kg   Height:        Intake/Output Summary (Last 24 hours) at 12/09/2023 1133 Last data filed at 12/09/2023 0837 Gross per 24 hour  Intake 1430.25 ml  Output 600 ml  Net 830.25 ml   Filed Weights   12/08/23 0841 12/08/23 1437 12/09/23 0517  Weight: 81.6 kg 83.6 kg 81.6 kg     Physical examination:   General:  AAO x 3,  cooperative, no distress;   HEENT:  Normocephalic, PERRL, otherwise with in Normal limits   Neuro:  CNII-XII intact. , normal motor and sensation, reflexes intact   Lungs:   Clear to auscultation BL, Respirations unlabored,  No wheezes / crackles  Cardio:    S1/S2, RRR, No murmure, No Rubs or Gallops   Abdomen:  Soft, non-tender, bowel sounds active all four quadrants, no  guarding or peritoneal signs.  Muscular  skeletal:  Limited exam -global generalized weaknesses - in bed, able to move all 4 extremities,   2+ pulses,  symmetric, No pitting edema  Skin:  Dry, warm to touch, left great toe sole puncture wound minimal drainage mild erythema edema  Wounds: Left great toe  wound - please see description per nursing staff          LABs:     Latest Ref Rng & Units 12/09/2023    3:45 AM 12/08/2023    9:04 AM 07/04/2022   10:36 AM  CBC  WBC 4.0 - 10.5 K/uL 10.7  8.8  6.5   Hemoglobin 13.0 - 17.0 g/dL 89.3  87.1  87.0   Hematocrit 39.0 - 52.0 % 31.9  39.6  39.2   Platelets 150 - 400 K/uL 131  149  144       Latest Ref Rng & Units 12/09/2023    3:45 AM 12/08/2023    9:04 AM 06/23/2023   11:05 AM  CMP  Glucose 70 - 99 mg/dL 94  889  899   BUN 8 - 23 mg/dL 26  21  22    Creatinine 0.61 - 1.24 mg/dL 8.90  8.94  9.01   Sodium 135 - 145 mmol/L 137  137  141   Potassium 3.5 - 5.1 mmol/L 3.1  4.3  4.6   Chloride 98 - 111 mmol/L 106  103  103   CO2 22 - 32 mmol/L 22  23  25    Calcium  8.9 - 10.3  mg/dL 8.4  9.1  9.2   Total Protein 6.5 - 8.1 g/dL  7.1    Total Bilirubin 0.0 - 1.2 mg/dL  2.2    Alkaline Phos 38 - 126 U/L  60    AST 15 - 41 U/L  25    ALT 0 - 44 U/L  26         Micro Results Recent Results (from the past 240 hours)  Blood Culture (routine x 2)     Status: None (Preliminary result)   Collection Time: 12/08/23  9:04 AM   Specimen: Blood  Result Value Ref Range Status   Specimen Description BLOOD LEFT ANTECUBITAL  Final   Special Requests   Final    BOTTLES DRAWN AEROBIC AND ANAEROBIC Blood Culture adequate volume Performed at [facility], [address], [place], [place] [address]    Culture PENDING  Incomplete   Report Status PENDING  Incomplete  Blood Culture (routine x 2)     Status: None (Preliminary result)   Collection Time: 12/08/23  9:09 AM   Specimen: BLOOD  Result Value Ref Range Status   Specimen Description   Final    BLOOD BLOOD RIGHT HAND Performed at [facility], [address], [place], [place] [address]    Special Requests   Final    BOTTLES DRAWN AEROBIC AND ANAEROBIC Blood Culture results may not be optimal due to an inadequate volume of blood received in culture bottles Performed at [facility], [address], [place], [place] [address]    Culture  Setup Time   Final    ANAEROBIC BOTTLE ONLY GRAM NEGATIVE RODS Gram Stain Report Called to,Read Back By and Verified With: KYM MORALE RN 12/09/23 @0649  BY J. WHITE Organism ID to follow Performed at [facility], [address], [place], [place] [address]    Culture GRAM NEGATIVE RODS  Final   Report Status PENDING  Incomplete  Blood Culture ID Panel (Reflexed)     Status: Abnormal   Collection Time: 12/08/23  9:09 AM  Result Value Ref Range Status   Enterococcus faecalis NOT DETECTED NOT DETECTED Final   Enterococcus Faecium NOT DETECTED NOT DETECTED Final   Listeria monocytogenes NOT DETECTED NOT DETECTED Final   Staphylococcus species NOT DETECTED NOT DETECTED Final    Staphylococcus aureus (BCID) NOT DETECTED NOT DETECTED Final   Staphylococcus epidermidis NOT DETECTED NOT DETECTED Final   Staphylococcus lugdunensis NOT DETECTED NOT DETECTED Final   Streptococcus species NOT DETECTED NOT DETECTED Final   Streptococcus agalactiae NOT DETECTED NOT DETECTED Final   Streptococcus pneumoniae NOT DETECTED NOT DETECTED Final   Streptococcus pyogenes NOT DETECTED NOT DETECTED Final   A.calcoaceticus-baumannii NOT DETECTED NOT DETECTED Final   Bacteroides fragilis NOT DETECTED NOT DETECTED Final   Enterobacterales DETECTED (A) NOT DETECTED Final    Comment: CRITICAL RESULT CALLED TO, READ BACK BY AND VERIFIED WITHBETHA PHEBE BLUSH PHARMD, AT 1102 12/09/23 D. VANHOOK    Enterobacter cloacae complex DETECTED (A) NOT DETECTED Final    Comment: CRITICAL RESULT CALLED TO, READ BACK BY AND VERIFIED WITH: PHEBE BLUSH PHARMD, AT 1102 12/09/23 D. VANHOOK    Escherichia coli NOT DETECTED NOT DETECTED Final   Klebsiella aerogenes DETECTED (A) NOT DETECTED Final    Comment: CRITICAL RESULT CALLED TO, READ BACK BY AND VERIFIED WITH: PHEBE BLUSH PHARMD, AT 1102 12/09/23 D. VANHOOK    Klebsiella oxytoca NOT DETECTED NOT DETECTED Final   Klebsiella pneumoniae NOT DETECTED NOT DETECTED Final   Proteus species NOT DETECTED NOT DETECTED Final   Salmonella species NOT DETECTED NOT DETECTED Final   Serratia marcescens NOT DETECTED NOT DETECTED Final   Haemophilus influenzae NOT DETECTED NOT DETECTED Final   Neisseria meningitidis NOT DETECTED NOT DETECTED Final   Pseudomonas aeruginosa NOT DETECTED NOT DETECTED Final   Stenotrophomonas maltophilia NOT DETECTED NOT DETECTED Final   Candida albicans NOT DETECTED NOT DETECTED Final   Candida auris NOT DETECTED NOT DETECTED Final   Candida glabrata NOT DETECTED NOT DETECTED Final   Candida krusei NOT DETECTED NOT DETECTED Final   Candida parapsilosis NOT DETECTED NOT DETECTED Final   Candida tropicalis NOT DETECTED NOT DETECTED Final    Cryptococcus neoformans/gattii NOT DETECTED NOT DETECTED Final   CTX-M ESBL NOT DETECTED NOT DETECTED Final   Carbapenem resistance IMP NOT DETECTED NOT DETECTED Final   Carbapenem resistance KPC NOT DETECTED NOT DETECTED Final   Carbapenem resistance NDM NOT DETECTED NOT DETECTED Final   Carbapenem resist OXA 48 LIKE NOT DETECTED NOT DETECTED Final   Carbapenem resistance VIM NOT DETECTED NOT DETECTED Final    Comment: Performed at [facility], [address], [place], [place] [address]  Resp panel by RT-PCR (RSV, Flu A&B, Covid) Anterior Nasal Swab     Status: None   Collection Time: 12/08/23  9:34 AM   Specimen: Anterior Nasal Swab  Result Value Ref Range Status   SARS Coronavirus 2 by RT PCR NEGATIVE NEGATIVE Final    Comment: (NOTE) SARS-CoV-2 target nucleic acids are NOT DETECTED.  The SARS-CoV-2 RNA is generally detectable in upper respiratory specimens during the acute phase of infection. The lowest concentration of SARS-CoV-2 viral copies this assay can detect is 138 copies/mL. A negative result does not preclude SARS-Cov-2 infection and should not be used as the sole basis for treatment or other patient management decisions. A negative result may occur with  improper specimen collection/handling, submission  of specimen other than nasopharyngeal swab, presence of viral mutation(s) within the areas targeted by this assay, and inadequate number of viral copies(<138 copies/mL). A negative result must be combined with clinical observations, patient history, and epidemiological information. The expected result is Negative.  Fact Sheet for Patients:  BloggerCourse.com  Fact Sheet for Healthcare Providers:  SeriousBroker.it  This test is no t yet approved or cleared by the [place]  FDA and  has been authorized for detection and/or diagnosis of SARS-CoV-2 by FDA under an Emergency Use Authorization (EUA). This EUA will  remain  in effect (meaning this test can be used) for the duration of the COVID-19 declaration under Section 564(b)(1) of the Act, 21 U.S.C.section 360bbb-3(b)(1), unless the authorization is terminated  or revoked sooner.       Influenza A by PCR NEGATIVE NEGATIVE Final   Influenza B by PCR NEGATIVE NEGATIVE Final    Comment: (NOTE) The Xpert Xpress SARS-CoV-2/FLU/RSV plus assay is intended as an aid in the diagnosis of influenza from Nasopharyngeal swab specimens and should not be used as a sole basis for treatment. Nasal washings and aspirates are unacceptable for Xpert Xpress SARS-CoV-2/FLU/RSV testing.  Fact Sheet for Patients: BloggerCourse.com  Fact Sheet for Healthcare Providers: SeriousBroker.it  This test is not yet approved or cleared by the [place]  FDA and has been authorized for detection and/or diagnosis of SARS-CoV-2 by FDA under an Emergency Use Authorization (EUA). This EUA will remain in effect (meaning this test can be used) for the duration of the COVID-19 declaration under Section 564(b)(1) of the Act, 21 U.S.C. section 360bbb-3(b)(1), unless the authorization is terminated or revoked.     Resp Syncytial Virus by PCR NEGATIVE NEGATIVE Final    Comment: (NOTE) Fact Sheet for Patients: BloggerCourse.com  Fact Sheet for Healthcare Providers: SeriousBroker.it  This test is not yet approved or cleared by the [place]  FDA and has been authorized for detection and/or diagnosis of SARS-CoV-2 by FDA under an Emergency Use Authorization (EUA). This EUA will remain in effect (meaning this test can be used) for the duration of the COVID-19 declaration under Section 564(b)(1) of the Act, 21 U.S.C. section 360bbb-3(b)(1), unless the authorization is terminated or revoked.  Performed at [facility] [address] [place], [place] [address]   Aerobic  Culture w Gram Stain (superficial specimen)     Status: None (Preliminary result)   Collection Time: 12/08/23  5:55 PM   Specimen: Toe; Wound  Result Value Ref Range Status   Specimen Description   Final    TOE LEFT Performed at [facility], [address], [place], [place] [address]    Special Requests   Final    Normal Performed at [facility], [address], [place], [place] [address]    Gram Stain NO WBC SEEN NO ORGANISMS SEEN   Final   Culture   Final    NO GROWTH < 12 HOURS Performed at [facility], [address] [place], [place] [address]    Report Status PENDING  Incomplete    Radiology Reports MR FOOT LEFT W WO CONTRAST Result Date: 12/08/2023 MR FOOT WITHOUT THEN WITH IV CONTRAST COMPARISON: None. CLINICAL HISTORY: Soft tissue infection suspected. Wound left great toe. PULSE SEQUENCES: Ax T1, Ax T2 FS, Sag T1, Sag T2 FS, Cor STIR, Cor T1 with and without IV contrast FINDINGS: There is a moderate-sized wound identified in the plantar aspect of the great toe. There is no full-thickness tissue loss. There is mild  edema in the tuft of the distal phalanx of the great toe which is nonspecific. This could be related to reactive edema or mild early osteomyelitis in the appropriate clinical setting. No destructive changes are present. 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 There is significant degenerative change in the second and third interphalangeal joints. Musculotendinous structures demonstrate no significant tenosynovitis or tendinosis. There is mild fatty atrophy of the intrinsic muscles foot. No focal drainable collection is present. IMPRESSION: No with mild edema in the tuft of the distal phalanx of the great toe as above. This is likely reactive. Mild early osteomyelitis cannot be entirely excluded. No destructive changes. No drainable collection. Follow-up suggested after conservative  management to ensure no significant progression. Moderate degenerative changes in the interphalangeal joints of the first, second and third toes. Electronically signed by: Norleen Satchel MD 12/08/2023 12:46 PM EDT RP Workstation: [id number]    SIGNED: Adriana DELENA Grams, MD, FHM. FAAFP. Jolynn Pack - [facility] Time spent - 55 min.  In seeing, evaluating and examining the patient. Reviewing medical records, labs, drawn plan of care. [facility],  Pager (please use [email or web address] to page/ text) Please use Epic Secure Chat for non-urgent communication (7AM-7PM)  If 7PM-7AM, please contact night-coverage [email or web address], 12/09/2023, 11:33 AM

## 2023-12-09 NOTE — Telephone Encounter (Signed)

## 2023-12-09 NOTE — Plan of Care (Signed)
   Problem: Education: Goal: Knowledge of General Education information will improve Description: Including pain rating scale, medication(s)/side effects and non-pharmacologic comfort measures Outcome: Progressing   Problem: Health Behavior/Discharge Planning: Goal: Ability to manage health-related needs will improve Outcome: Progressing   Problem: Clinical Measurements: Goal: Ability to maintain clinical measurements within normal limits will improve Outcome: Progressing Goal: Will remain free from infection Outcome: Progressing Goal: Diagnostic test results will improve Outcome: Progressing Goal: Respiratory complications will improve Outcome: Progressing Goal: Cardiovascular complication will be avoided Outcome: Progressing   Problem: Nutrition: Goal: Adequate nutrition will be maintained Outcome: Progressing   Problem: Activity: Goal: Risk for activity intolerance will decrease Outcome: Progressing

## 2023-12-09 NOTE — Progress Notes (Signed)
 Left Great toe cleansed with NS and betadine . Covered with Xerofoam, gauze, and compression wrap.

## 2023-12-09 NOTE — Progress Notes (Signed)
 PODIATRY PROGRESS NOTE  SUBJECTIVE 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 No current nausea, vomiting, fever or chills.  MEDICATIONS Scheduled Meds:  budesonide  (PULMICORT ) nebulizer solution  0.5 mg Nebulization BID   carvedilol   25 mg Oral BID WC   dextromethorphan -guaiFENesin   1 tablet Oral BID   finasteride   5 mg Oral Daily   furosemide   20 mg Oral Daily   insulin  aspart  0-5 Units Subcutaneous QHS   insulin  aspart  0-9 Units Subcutaneous TID WC   isosorbide  mononitrate  60 mg Oral Daily   levothyroxine   175 mcg Oral QAC breakfast   rosuvastatin   10 mg Oral Daily   Continuous Infusions:  levofloxacin  (LEVAQUIN ) IV 750 mg (12/09/23 1233)   PRN Meds:.acetaminophen  **OR** acetaminophen , HYDROmorphone  (DILAUDID ) injection, ipratropium-albuterol , ondansetron  **OR** ondansetron  (ZOFRAN ) IV, sodium chloride   OBJECTIVE Vital signs in last 24 hours:   Temp:  [97.6 F (36.4 C)-98.7 F (37.1 C)] 97.6 F (36.4 C) (09/03 1441) Pulse Rate:  [70-92] 83 (09/03 1441) Resp:  [18] 18 (09/03 1441) BP: (105-121)/(54-78) 106/65 (09/03 1441) SpO2:  [92 %-100 %] 96 % (09/03 1441) Weight:  [81.6 kg] 81.6 kg (09/03 0517)  Left foot examination reveals a chronic ulcer of the left great toe. It is unchanged from yesterday. DP pulse palpable. PT pulse palpable. Edema of distal aspect of the left hallux.  LAB/TEST RESULTS  Recent Labs    12/08/23 0904 12/09/23 0345  WBC 8.8 10.7*  HGB 12.8* 10.6*  HCT 39.6 31.9*  PLT 149* 131*  NA 137 137  K 4.3 3.1*  CL 103 106  CO2 23 22  BUN 21 26*  CREATININE 1.05 1.09  GLUCOSE 110* 94  CALCIUM  9.1 8.4*    Recent Results (from the past 240 hours)  Blood Culture (routine x 2)     Status: None (Preliminary result)   Collection Time: 12/08/23  9:04 AM   Specimen: BLOOD  Result Value Ref Range Status   Specimen Description   Final    BLOOD LEFT ANTECUBITAL Performed at  [facility], [address] [place], [place] [address]    Special Requests   Final    BOTTLES DRAWN AEROBIC AND ANAEROBIC Blood Culture adequate volume Performed at [facility], [address], [place], [place] [address]    Culture  Setup Time   Final    AEROBIC BOTTLE ONLY GRAM NEGATIVE RODS Gram Stain Report Called to,Read Back By and Verified With: W. EARLY AT 1350 ON 09.03.25 BY ADGER J Performed at [facility], [address], [place], [place] [address]    Culture   Final    NO GROWTH 1 DAY Performed at [facility], [address], [place], [place] [address]    Report Status PENDING  Incomplete  Blood Culture (routine x 2)     Status: None (Preliminary result)   Collection Time: 12/08/23  9:09 AM   Specimen: BLOOD RIGHT HAND  Result Value Ref Range Status   Specimen Description   Final    BLOOD RIGHT HAND Performed at [facility], [address], [place], [place] [address]    Special Requests   Final    BOTTLES DRAWN AEROBIC AND ANAEROBIC Blood Culture results may not be optimal due to an inadequate volume of blood received in culture bottles Performed at [facility], [address], [place], [place] [address]    Culture  Setup Time   Final  IN BOTH AEROBIC AND ANAEROBIC BOTTLES GRAM NEGATIVE RODS Gram Stain Report Called to,Read Back By and Verified With: CSABRA MORALE RN 12/09/23 @0649  BY J. WHITE GRAM STAIN REVIEWED-AGREE WITH RESULT DRT Performed at [facility], [address], [place], [place] [address]    Culture GRAM NEGATIVE RODS  Final   Report Status PENDING  Incomplete  Blood Culture ID Panel (Reflexed)     Status: Abnormal   Collection Time: 12/08/23  9:09 AM  Result Value Ref Range Status   Enterococcus faecalis NOT DETECTED NOT DETECTED Final   Enterococcus Faecium NOT DETECTED NOT DETECTED Final   Listeria monocytogenes NOT DETECTED NOT DETECTED Final   Staphylococcus species NOT DETECTED NOT DETECTED Final   Staphylococcus aureus  (BCID) NOT DETECTED NOT DETECTED Final   Staphylococcus epidermidis NOT DETECTED NOT DETECTED Final   Staphylococcus lugdunensis NOT DETECTED NOT DETECTED Final   Streptococcus species NOT DETECTED NOT DETECTED Final   Streptococcus agalactiae NOT DETECTED NOT DETECTED Final   Streptococcus pneumoniae NOT DETECTED NOT DETECTED Final   Streptococcus pyogenes NOT DETECTED NOT DETECTED Final   A.calcoaceticus-baumannii NOT DETECTED NOT DETECTED Final   Bacteroides fragilis NOT DETECTED NOT DETECTED Final   Enterobacterales DETECTED (A) NOT DETECTED Final    Comment: CRITICAL RESULT CALLED TO, READ BACK BY AND VERIFIED WITHBETHA PHEBE BLUSH PHARMD, AT 1102 12/09/23 D. VANHOOK    Enterobacter cloacae complex DETECTED (A) NOT DETECTED Final    Comment: CRITICAL RESULT CALLED TO, READ BACK BY AND VERIFIED WITH: PHEBE BLUSH PHARMD, AT 1102 12/09/23 D. VANHOOK    Escherichia coli NOT DETECTED NOT DETECTED Final   Klebsiella aerogenes DETECTED (A) NOT DETECTED Final    Comment: CRITICAL RESULT CALLED TO, READ BACK BY AND VERIFIED WITH: PHEBE BLUSH PHARMD, AT 1102 12/09/23 D. VANHOOK    Klebsiella oxytoca NOT DETECTED NOT DETECTED Final   Klebsiella pneumoniae NOT DETECTED NOT DETECTED Final   Proteus species NOT DETECTED NOT DETECTED Final   Salmonella species NOT DETECTED NOT DETECTED Final   Serratia marcescens NOT DETECTED NOT DETECTED Final   Haemophilus influenzae NOT DETECTED NOT DETECTED Final   Neisseria meningitidis NOT DETECTED NOT DETECTED Final   Pseudomonas aeruginosa NOT DETECTED NOT DETECTED Final   Stenotrophomonas maltophilia NOT DETECTED NOT DETECTED Final   Candida albicans NOT DETECTED NOT DETECTED Final   Candida auris NOT DETECTED NOT DETECTED Final   Candida glabrata NOT DETECTED NOT DETECTED Final   Candida krusei NOT DETECTED NOT DETECTED Final   Candida parapsilosis NOT DETECTED NOT DETECTED Final   Candida tropicalis NOT DETECTED NOT DETECTED Final   Cryptococcus  neoformans/gattii NOT DETECTED NOT DETECTED Final   CTX-M ESBL NOT DETECTED NOT DETECTED Final   Carbapenem resistance IMP NOT DETECTED NOT DETECTED Final   Carbapenem resistance KPC NOT DETECTED NOT DETECTED Final   Carbapenem resistance NDM NOT DETECTED NOT DETECTED Final   Carbapenem resist OXA 48 LIKE NOT DETECTED NOT DETECTED Final   Carbapenem resistance VIM NOT DETECTED NOT DETECTED Final    Comment: Performed at [facility], [address], [place], [place] [address]  Resp panel by RT-PCR (RSV, Flu A&B, Covid) Anterior Nasal Swab     Status: None   Collection Time: 12/08/23  9:34 AM   Specimen: Anterior Nasal Swab  Result Value Ref Range Status   SARS Coronavirus 2 by RT PCR NEGATIVE NEGATIVE Final    Comment: (NOTE) SARS-CoV-2 target nucleic acids are NOT DETECTED.  The SARS-CoV-2 RNA is  generally detectable in upper respiratory specimens during the acute phase of infection. The lowest concentration of SARS-CoV-2 viral copies this assay can detect is 138 copies/mL. A negative result does not preclude SARS-Cov-2 infection and should not be used as the sole basis for treatment or other patient management decisions. A negative result may occur with  improper specimen collection/handling, submission of specimen other than nasopharyngeal swab, presence of viral mutation(s) within the areas targeted by this assay, and inadequate number of viral copies(<138 copies/mL). A negative result must be combined with clinical observations, patient history, and epidemiological information. The expected result is Negative.  Fact Sheet for Patients:  BloggerCourse.com  Fact Sheet for Healthcare Providers:  SeriousBroker.it  This test is no t yet approved or cleared by the [place]  FDA and  has been authorized for detection and/or diagnosis of SARS-CoV-2 by FDA under an Emergency Use Authorization (EUA). This EUA will remain  in  effect (meaning this test can be used) for the duration of the COVID-19 declaration under Section 564(b)(1) of the Act, 21 U.S.C.section 360bbb-3(b)(1), unless the authorization is terminated  or revoked sooner.       Influenza A by PCR NEGATIVE NEGATIVE Final   Influenza B by PCR NEGATIVE NEGATIVE Final    Comment: (NOTE) The Xpert Xpress SARS-CoV-2/FLU/RSV plus assay is intended as an aid in the diagnosis of influenza from Nasopharyngeal swab specimens and should not be used as a sole basis for treatment. Nasal washings and aspirates are unacceptable for Xpert Xpress SARS-CoV-2/FLU/RSV testing.  Fact Sheet for Patients: BloggerCourse.com  Fact Sheet for Healthcare Providers: SeriousBroker.it  This test is not yet approved or cleared by the [place]  FDA and has been authorized for detection and/or diagnosis of SARS-CoV-2 by FDA under an Emergency Use Authorization (EUA). This EUA will remain in effect (meaning this test can be used) for the duration of the COVID-19 declaration under Section 564(b)(1) of the Act, 21 U.S.C. section 360bbb-3(b)(1), unless the authorization is terminated or revoked.     Resp Syncytial Virus by PCR NEGATIVE NEGATIVE Final    Comment: (NOTE) Fact Sheet for Patients: BloggerCourse.com  Fact Sheet for Healthcare Providers: SeriousBroker.it  This test is not yet approved or cleared by the [place]  FDA and has been authorized for detection and/or diagnosis of SARS-CoV-2 by FDA under an Emergency Use Authorization (EUA). This EUA will remain in effect (meaning this test can be used) for the duration of the COVID-19 declaration under Section 564(b)(1) of the Act, 21 U.S.C. section 360bbb-3(b)(1), unless the authorization is terminated or revoked.  Performed at [facility], [address] [place], [place] [address]   Aerobic Culture w Gram  Stain (superficial specimen)     Status: None (Preliminary result)   Collection Time: 12/08/23  5:55 PM   Specimen: Toe; Wound  Result Value Ref Range Status   Specimen Description   Final    TOE LEFT Performed at [facility], [address], [place], [place] [address]    Special Requests   Final    Normal Performed at [facility], [address] [place], [place] [address]    Gram Stain NO WBC SEEN NO ORGANISMS SEEN   Final   Culture   Final    NO GROWTH < 12 HOURS Performed at [facility], [address], [place], [place] [address]    Report Status PENDING  Incomplete     MR FOOT LEFT W WO CONTRAST Result Date: 12/08/2023 MR FOOT WITHOUT THEN  WITH IV CONTRAST COMPARISON: None. CLINICAL HISTORY: Soft tissue infection suspected. Wound left great toe. PULSE SEQUENCES: Ax T1, Ax T2 FS, Sag T1, Sag T2 FS, Cor STIR, Cor T1 with and without IV contrast FINDINGS: There is a moderate-sized wound identified in the plantar aspect of the great toe. There is no full-thickness tissue loss. There is mild edema in the tuft of the distal phalanx of the great toe which is nonspecific. This could be related to reactive edema or mild early osteomyelitis in the appropriate clinical setting. No destructive changes are present. 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 There is significant degenerative change in the second and third interphalangeal joints. Musculotendinous structures demonstrate no significant tenosynovitis or tendinosis. There is mild fatty atrophy of the intrinsic muscles foot. No focal drainable collection is present. IMPRESSION: No with mild edema in the tuft of the distal phalanx of the great toe as above. This is likely reactive. Mild early osteomyelitis cannot be entirely excluded. No destructive changes. No drainable collection. Follow-up suggested after conservative management to ensure no significant  progression. Moderate degenerative changes in the interphalangeal joints of the first, second and third toes. Electronically signed by: Norleen Satchel MD 12/08/2023 12:46 PM EDT RP Workstation: [id number]   DG Toe Great Left Result Date: 12/08/2023 CLINICAL DATA:  Left great toe has open sore on the and, wound, infection EXAM: LEFT GREAT TOE COMPARISON:  March 20, 2013 foot radiographs FINDINGS: Soft tissue ulceration and swelling at the distal aspect of the first toe. No acute osseous fractures identified. No lytic or blastic osseous lesions. No osseous erosions identified. Degenerative changes of interphalangeal and first MTP joints. IMPRESSION: First toe soft tissue ulceration and swelling with no radiographic evidence for acute osteomyelitis. Electronically Signed   By: Michaeline Blanch M.D.   On: 12/08/2023 11:07   DG Chest Port 1 View Result Date: 12/08/2023 EXAM: 1 VIEW XRAY OF THE CHEST 12/08/2023 08:56:00 AM COMPARISON: AP radiograph of chest dated 01/17/2021. CLINICAL HISTORY: Questionable sepsis - evaluate for abnormality. Pt c/o flue like symptoms, brb EMS, O2 sat at 87 in field, placed on 2L O2. Temp checked at bedside at arrival, 100.7 F. Pt denies N/V/D, A\T\Ox4. Hx of afib, CAD, hypertension, pneumonia. Non smoker. FINDINGS: LUNGS AND PLEURA: Mildly prominent reticular opacities present within the periphery of the lungs bilaterally suggesting interstitial edema. No focal pulmonary opacity. No pleural effusion. No pneumothorax. HEART AND MEDIASTINUM: No acute abnormality of the cardiac and mediastinal silhouettes. BONES AND SOFT TISSUES: No acute osseous abnormality. IMPRESSION: 1. Mildly prominent reticular opacities within the periphery of the lungs bilaterally, suggesting interstitial edema. Electronically signed by: Evalene Coho MD 12/08/2023 09:04 AM EDT RP Workstation: [id number]    ASSESSMENT & PLAN 1. Bacteremia:  Antibiotics per primary team/pharmacy.  Monitoring clinical response  to treatment.  2. Left great toe ulceration with possible early osteomyelitis:  Partial amputation of the left great toe scheduled for Friday at 1:00 PM, with possibility of earlier scheduling if available.  Will obtain intraoperative cultures to guide definitive antibiotic therapy.  Betadine  dressing applied; will change dressing daily until surgery.   3. Atrial fibrillation on anticoagulation therapy:  Monitoring INR; currently at 2.9.  May consider vitamin K administration if INR does not decrease naturally.  Morene LILLETTE Anon 12/09/2023, 5:31 PM

## 2023-12-10 DIAGNOSIS — L03032 Cellulitis of left toe: Secondary | ICD-10-CM | POA: Diagnosis not present

## 2023-12-10 LAB — BLOOD CULTURE ID PANEL (REFLEXED) - BCID2

## 2023-12-10 LAB — GLUCOSE, CAPILLARY
Glucose-Capillary: 95 mg/dL (ref 70–99)
Glucose-Capillary: 98 mg/dL (ref 70–99)
Glucose-Capillary: 116 mg/dL — ABNORMAL HIGH (ref 70–99)
Glucose-Capillary: 119 mg/dL — ABNORMAL HIGH (ref 70–99)

## 2023-12-10 LAB — PROTIME-INR
INR: 2.4 — ABNORMAL HIGH (ref 0.8–1.2)
Prothrombin Time: 27.4 s — ABNORMAL HIGH (ref 11.4–15.2)

## 2023-12-10 MED ORDER — PHYTONADIONE 5 MG PO TABS
2.5000 mg | ORAL_TABLET | Freq: Once | ORAL | Status: AC
  Administered 2023-12-10: 2.5 mg via ORAL
  Filled 2023-12-10: qty 1

## 2023-12-10 MED ORDER — MUPIROCIN 2 % EX OINT
1.0000 | TOPICAL_OINTMENT | Freq: Two times a day (BID) | CUTANEOUS | Status: DC
  Administered 2023-12-11: 1 via NASAL
  Filled 2023-12-10: qty 22

## 2023-12-10 NOTE — Progress Notes (Signed)
 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 No current nausea, vomiting, fever or chills. His wife and daughter are present at bedside.  MEDICATIONS Scheduled Meds:  budesonide  (PULMICORT ) nebulizer solution  0.5 mg Nebulization BID   carvedilol   25 mg Oral BID WC   dextromethorphan -guaiFENesin   1 tablet Oral BID   finasteride   5 mg Oral Daily   furosemide   20 mg Oral Daily   insulin  aspart  0-5 Units Subcutaneous QHS   insulin  aspart  0-9 Units Subcutaneous TID WC   isosorbide  mononitrate  60 mg Oral Daily   levothyroxine   175 mcg Oral QAC breakfast   rosuvastatin   10 mg Oral Daily   Continuous Infusions:  levofloxacin  (LEVAQUIN ) IV 750 mg (12/10/23 1119)   PRN Meds:.acetaminophen  **OR** acetaminophen , HYDROmorphone  (DILAUDID ) injection, ipratropium-albuterol , ondansetron  **OR** ondansetron  (ZOFRAN ) IV, sodium chloride   OBJECTIVE Vital signs in last 24 hours:   Temp:  [97.6 F (36.4 C)-98.1 F (36.7 C)] 98.1 F (36.7 C) (09/04 0500) Pulse Rate:  [80-87] 80 (09/04 0500) Resp:  [18-20] 20 (09/03 1956) BP: (106-126)/(55-73) 106/55 (09/04 0500) SpO2:  [96 %-98 %] 96 % (09/04 0729) Weight:  [82 kg] 82 kg (09/04 0500)  Left foot examination reveals a chronic ulcer of the left great toe. It is unchanged from yesterday. DP pulse palpable. PT pulse palpable. Edema of distal aspect of the left hallux. Slight improvement in edema from yesterday.  LAB/TEST RESULTS  Recent Labs    12/08/23 0904 12/09/23 0345  WBC 8.8 10.7*  HGB 12.8* 10.6*  HCT 39.6 31.9*  PLT 149* 131*  NA 137 137  K 4.3 3.1*  CL 103 106  CO2 23 22  BUN 21 26*  CREATININE 1.05 1.09  GLUCOSE 110* 94  CALCIUM  9.1 8.4*     Latest Reference Range & Units 12/10/23 03:49  INR 0.8 - 1.2  2.4 (H)    Recent Results (from the past 240 hours)  Blood Culture (routine x 2)     Status: None (Preliminary  result)   Collection Time: 12/08/23  9:04 AM   Specimen: BLOOD  Result Value Ref Range Status   Specimen Description   Final    BLOOD LEFT ANTECUBITAL Performed at [facility], [address], [place], [place] [address]    Special Requests   Final    BOTTLES DRAWN AEROBIC AND ANAEROBIC Blood Culture adequate volume Performed at [facility], [address] [place], [place] [address]    Culture  Setup Time   Final    AEROBIC BOTTLE ONLY GRAM NEGATIVE RODS Gram Stain Report Called to,Read Back By and Verified With: W. EARLY AT 1350 ON 09.03.25 BY ADGER J GRAM STAIN REVIEWED-AGREE WITH RESULT DRT ANAEROBIC BOTTLE ONLY GRAM VARIABLE ROD GRAM POSITIVE COCCI GRAM VARIABLE ROD CRITICAL RESULT CALLED TO, READ BACK BY AND VERIFIED WITH: CHRISTELLA MAYER RN 12/10/2023 @ 0108 BY AB Performed at [facility], [address], [place], [place] [address]    Culture GRAM NEGATIVE RODS  Final   Report Status PENDING  Incomplete  Blood Culture (routine x 2)     Status: None (Preliminary result)   Collection Time: 12/08/23  9:09 AM   Specimen: BLOOD RIGHT HAND  Result Value Ref Range Status   Specimen Description   Final    BLOOD RIGHT HAND Performed at [facility], [address] [place], [place] [address]  Special Requests   Final    BOTTLES DRAWN AEROBIC AND ANAEROBIC Blood Culture results may not be optimal due to an inadequate volume of blood received in culture bottles Performed at [facility], [address], [place], [place] [address]    Culture  Setup Time   Final    IN BOTH AEROBIC AND ANAEROBIC BOTTLES GRAM NEGATIVE RODS Gram Stain Report Called to,Read Back By and Verified With: CSABRA MORALE RN 12/09/23 @0649  BY J. WHITE GRAM STAIN REVIEWED-AGREE WITH RESULT DRT    Culture   Final    GRAM NEGATIVE RODS CULTURE REINCUBATED FOR BETTER GROWTH Performed at [facility], [address] [place], [place] [address]    Report Status PENDING  Incomplete  Blood Culture ID  Panel (Reflexed)     Status: Abnormal   Collection Time: 12/08/23  9:09 AM  Result Value Ref Range Status   Enterococcus faecalis NOT DETECTED NOT DETECTED Final   Enterococcus Faecium NOT DETECTED NOT DETECTED Final   Listeria monocytogenes NOT DETECTED NOT DETECTED Final   Staphylococcus species NOT DETECTED NOT DETECTED Final   Staphylococcus aureus (BCID) NOT DETECTED NOT DETECTED Final   Staphylococcus epidermidis NOT DETECTED NOT DETECTED Final   Staphylococcus lugdunensis NOT DETECTED NOT DETECTED Final   Streptococcus species NOT DETECTED NOT DETECTED Final   Streptococcus agalactiae NOT DETECTED NOT DETECTED Final   Streptococcus pneumoniae NOT DETECTED NOT DETECTED Final   Streptococcus pyogenes NOT DETECTED NOT DETECTED Final   A.calcoaceticus-baumannii NOT DETECTED NOT DETECTED Final   Bacteroides fragilis NOT DETECTED NOT DETECTED Final   Enterobacterales DETECTED (A) NOT DETECTED Final    Comment: CRITICAL RESULT CALLED TO, READ BACK BY AND VERIFIED WITHBETHA PHEBE BLUSH PHARMD, AT 1102 12/09/23 D. VANHOOK    Enterobacter cloacae complex DETECTED (A) NOT DETECTED Final    Comment: CRITICAL RESULT CALLED TO, READ BACK BY AND VERIFIED WITH: PHEBE BLUSH PHARMD, AT 1102 12/09/23 D. VANHOOK    Escherichia coli NOT DETECTED NOT DETECTED Final   Klebsiella aerogenes DETECTED (A) NOT DETECTED Final    Comment: CRITICAL RESULT CALLED TO, READ BACK BY AND VERIFIED WITH: PHEBE BLUSH PHARMD, AT 1102 12/09/23 D. VANHOOK    Klebsiella oxytoca NOT DETECTED NOT DETECTED Final   Klebsiella pneumoniae NOT DETECTED NOT DETECTED Final   Proteus species NOT DETECTED NOT DETECTED Final   Salmonella species NOT DETECTED NOT DETECTED Final   Serratia marcescens NOT DETECTED NOT DETECTED Final   Haemophilus influenzae NOT DETECTED NOT DETECTED Final   Neisseria meningitidis NOT DETECTED NOT DETECTED Final   Pseudomonas aeruginosa NOT DETECTED NOT DETECTED Final   Stenotrophomonas maltophilia NOT DETECTED  NOT DETECTED Final   Candida albicans NOT DETECTED NOT DETECTED Final   Candida auris NOT DETECTED NOT DETECTED Final   Candida glabrata NOT DETECTED NOT DETECTED Final   Candida krusei NOT DETECTED NOT DETECTED Final   Candida parapsilosis NOT DETECTED NOT DETECTED Final   Candida tropicalis NOT DETECTED NOT DETECTED Final   Cryptococcus neoformans/gattii NOT DETECTED NOT DETECTED Final   CTX-M ESBL NOT DETECTED NOT DETECTED Final   Carbapenem resistance IMP NOT DETECTED NOT DETECTED Final   Carbapenem resistance KPC NOT DETECTED NOT DETECTED Final   Carbapenem resistance NDM NOT DETECTED NOT DETECTED Final   Carbapenem resist OXA 48 LIKE NOT DETECTED NOT DETECTED Final   Carbapenem resistance VIM NOT DETECTED NOT DETECTED Final    Comment: Performed at [facility], [address], [place], [place] [address]  Resp panel by RT-PCR (RSV, Flu A&B, Covid) Anterior Nasal Swab     Status: None   Collection Time: 12/08/23  9:34 AM   Specimen: Anterior Nasal Swab  Result Value Ref Range Status   SARS Coronavirus 2 by RT PCR NEGATIVE NEGATIVE Final    Comment: (NOTE) SARS-CoV-2 target nucleic acids are NOT DETECTED.  The SARS-CoV-2 RNA is generally detectable in upper respiratory specimens during the acute phase of infection. The lowest concentration of SARS-CoV-2 viral copies this assay can detect is 138 copies/mL. A negative result does not preclude SARS-Cov-2 infection and should not be used as the sole basis for treatment or other patient management decisions. A negative result may occur with  improper specimen collection/handling, submission of specimen other than nasopharyngeal swab, presence of viral mutation(s) within the areas targeted by this assay, and inadequate number of viral copies(<138 copies/mL). A negative result must be combined with clinical observations, patient history, and epidemiological information. The expected result is Negative.  Fact Sheet for Patients:   BloggerCourse.com  Fact Sheet for Healthcare Providers:  SeriousBroker.it  This test is no t yet approved or cleared by the [place]  FDA and  has been authorized for detection and/or diagnosis of SARS-CoV-2 by FDA under an Emergency Use Authorization (EUA). This EUA will remain  in effect (meaning this test can be used) for the duration of the COVID-19 declaration under Section 564(b)(1) of the Act, 21 U.S.C.section 360bbb-3(b)(1), unless the authorization is terminated  or revoked sooner.       Influenza A by PCR NEGATIVE NEGATIVE Final   Influenza B by PCR NEGATIVE NEGATIVE Final    Comment: (NOTE) The Xpert Xpress SARS-CoV-2/FLU/RSV plus assay is intended as an aid in the diagnosis of influenza from Nasopharyngeal swab specimens and should not be used as a sole basis for treatment. Nasal washings and aspirates are unacceptable for Xpert Xpress SARS-CoV-2/FLU/RSV testing.  Fact Sheet for Patients: BloggerCourse.com  Fact Sheet for Healthcare Providers: SeriousBroker.it  This test is not yet approved or cleared by the [place]  FDA and has been authorized for detection and/or diagnosis of SARS-CoV-2 by FDA under an Emergency Use Authorization (EUA). This EUA will remain in effect (meaning this test can be used) for the duration of the COVID-19 declaration under Section 564(b)(1) of the Act, 21 U.S.C. section 360bbb-3(b)(1), unless the authorization is terminated or revoked.     Resp Syncytial Virus by PCR NEGATIVE NEGATIVE Final    Comment: (NOTE) Fact Sheet for Patients: BloggerCourse.com  Fact Sheet for Healthcare Providers: SeriousBroker.it  This test is not yet approved or cleared by the [place]  FDA and has been authorized for detection and/or diagnosis of SARS-CoV-2 by FDA under an Emergency Use  Authorization (EUA). This EUA will remain in effect (meaning this test can be used) for the duration of the COVID-19 declaration under Section 564(b)(1) of the Act, 21 U.S.C. section 360bbb-3(b)(1), unless the authorization is terminated or revoked.  Performed at [facility], [address], [place], [place] [address]   Aerobic Culture w Gram Stain (superficial specimen)     Status: None (Preliminary result)   Collection Time: 12/08/23  5:55 PM   Specimen: Toe; Wound  Result Value Ref Range Status   Specimen Description   Final    TOE LEFT Performed at [facility], [address] [place], [place] [address]    Special Requests   Final    Normal Performed at [facility], [address] [place], [place] [address]  Gram Stain NO WBC SEEN NO ORGANISMS SEEN   Final   Culture   Final    CULTURE REINCUBATED FOR BETTER GROWTH Performed at [facility], [address] [place], [place] [address]    Report Status PENDING  Incomplete  Blood Culture ID Panel (Reflexed)     Status: Abnormal   Collection Time: 12/09/23  9:03 AM  Result Value Ref Range Status   Enterococcus faecalis NOT DETECTED NOT DETECTED Final   Enterococcus Faecium NOT DETECTED NOT DETECTED Final   Listeria monocytogenes NOT DETECTED NOT DETECTED Final   Staphylococcus species NOT DETECTED NOT DETECTED Final   Staphylococcus aureus (BCID) NOT DETECTED NOT DETECTED Final   Staphylococcus epidermidis NOT DETECTED NOT DETECTED Final   Staphylococcus lugdunensis NOT DETECTED NOT DETECTED Final   Streptococcus species NOT DETECTED NOT DETECTED Final   Streptococcus agalactiae NOT DETECTED NOT DETECTED Final   Streptococcus pneumoniae NOT DETECTED NOT DETECTED Final   Streptococcus pyogenes NOT DETECTED NOT DETECTED Final   A.calcoaceticus-baumannii NOT DETECTED NOT DETECTED Final   Bacteroides fragilis NOT DETECTED NOT DETECTED Final   Enterobacterales DETECTED (A) NOT DETECTED Final    Comment:  Enterobacterales represent a large order of gram negative bacteria, not a single organism. Refer to culture for further identification. CRITICAL RESULT CALLED TO, READ BACK BY AND VERIFIED WITH: M ATKINS RN 12/10/2023 @ 0108 BY AB    Enterobacter cloacae complex NOT DETECTED NOT DETECTED Final   Escherichia coli NOT DETECTED NOT DETECTED Final   Klebsiella aerogenes NOT DETECTED NOT DETECTED Final   Klebsiella oxytoca NOT DETECTED NOT DETECTED Final   Klebsiella pneumoniae NOT DETECTED NOT DETECTED Final   Proteus species NOT DETECTED NOT DETECTED Final   Salmonella species NOT DETECTED NOT DETECTED Final   Serratia marcescens NOT DETECTED NOT DETECTED Final   Haemophilus influenzae NOT DETECTED NOT DETECTED Final   Neisseria meningitidis NOT DETECTED NOT DETECTED Final   Pseudomonas aeruginosa NOT DETECTED NOT DETECTED Final   Stenotrophomonas maltophilia NOT DETECTED NOT DETECTED Final   Candida albicans NOT DETECTED NOT DETECTED Final   Candida auris NOT DETECTED NOT DETECTED Final   Candida glabrata NOT DETECTED NOT DETECTED Final   Candida krusei NOT DETECTED NOT DETECTED Final   Candida parapsilosis NOT DETECTED NOT DETECTED Final   Candida tropicalis NOT DETECTED NOT DETECTED Final   Cryptococcus neoformans/gattii NOT DETECTED NOT DETECTED Final   CTX-M ESBL NOT DETECTED NOT DETECTED Final   Carbapenem resistance IMP NOT DETECTED NOT DETECTED Final   Carbapenem resistance KPC NOT DETECTED NOT DETECTED Final   Carbapenem resistance NDM NOT DETECTED NOT DETECTED Final   Carbapenem resist OXA 48 LIKE NOT DETECTED NOT DETECTED Final   Carbapenem resistance VIM NOT DETECTED NOT DETECTED Final    Comment: Performed at [facility], [address], [place], [place] [address]     MR FOOT LEFT W WO CONTRAST Result Date: 12/08/2023 MR FOOT WITHOUT THEN WITH IV CONTRAST COMPARISON: None. CLINICAL HISTORY: Soft tissue infection suspected. Wound left great toe. PULSE SEQUENCES: Ax T1,  Ax T2 FS, Sag T1, Sag T2 FS, Cor STIR, Cor T1 with and without IV contrast FINDINGS: There is a moderate-sized wound identified in the plantar aspect of the great toe. There is no full-thickness tissue loss. There is mild edema in the tuft of the distal phalanx of the great toe which is nonspecific. This could be related to reactive edema or mild early osteomyelitis in the appropriate clinical setting. No  destructive changes are present. 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 There is significant degenerative change in the second and third interphalangeal joints. Musculotendinous structures demonstrate no significant tenosynovitis or tendinosis. There is mild fatty atrophy of the intrinsic muscles foot. No focal drainable collection is present. IMPRESSION: No with mild edema in the tuft of the distal phalanx of the great toe as above. This is likely reactive. Mild early osteomyelitis cannot be entirely excluded. No destructive changes. No drainable collection. Follow-up suggested after conservative management to ensure no significant progression. Moderate degenerative changes in the interphalangeal joints of the first, second and third toes. Electronically signed by: Norleen Satchel MD 12/08/2023 12:46 PM EDT RP Workstation: [id number]    ASSESSMENT & PLAN 1. Left great toe ulceration with possible early osteomyelitis:  Partial amputation of the left great toe scheduled for 12/10/2023 at 1:00 PM.  Benefits and risks reviewed.  Will obtain intraoperative cultures to guide definitive antibiotic therapy.  Betadine  dressing applied; will change dressing daily until surgery.  NPO after midnight.  Order placed to obtain consent.   2. Atrial fibrillation on anticoagulation therapy:  Monitoring INR; currently at 2.4.  Jerry Cain Anon 12/10/2023, 12:22 PM

## 2023-12-10 NOTE — Plan of Care (Signed)
  Problem: Education: Goal: Knowledge of General Education information will improve Description: Including pain rating scale, medication(s)/side effects and non-pharmacologic comfort measures Outcome: Progressing   Problem: Clinical Measurements: Goal: Will remain free from infection Outcome: Progressing Goal: Diagnostic test results will improve Outcome: Progressing   Problem: Activity: Goal: Risk for activity intolerance will decrease Outcome: Progressing   Problem: Coping: Goal: Level of anxiety will decrease Outcome: Progressing   Problem: Elimination: Goal: Will not experience complications related to bowel motility Outcome: Progressing Goal: Will not experience complications related to urinary retention Outcome: Progressing   Problem: Pain Managment: Goal: General experience of comfort will improve and/or be controlled Outcome: Progressing   Problem: Safety: Goal: Ability to remain free from injury will improve Outcome: Progressing   Problem: Skin Integrity: Goal: Risk for impaired skin integrity will decrease Outcome: Progressing   Problem: Coping: Goal: Ability to adjust to condition or change in health will improve Outcome: Progressing   Problem: Metabolic: Goal: Ability to maintain appropriate glucose levels will improve Outcome: Progressing   Problem: Tissue Perfusion: Goal: Adequacy of tissue perfusion will improve Outcome: Progressing

## 2023-12-10 NOTE — Progress Notes (Signed)
 PROGRESS NOTE    Patient: Jerry Cain                            PCP: Colette Torrence GRADE, MD                    DOB: 20-Nov-1931            DOA: 12/08/2023 FMW:[id number]             DOS: 12/10/2023, 9:18 AM   LOS: 2 days   Date of Service: The patient was seen and examined on 12/10/2023  Subjective:   The patient was seen and examined this morning, stable no acute distress Hemodynamically stable  Brief Narrative:   Jerry Cain is a 89 y.o. male with medical history significant of BPH, chronic atrial fibrillation on Coumadin , GERD, hyperlipidemia, hypertension, hypothyroidism, history of asthma/COPD and and type 2 diabetes mellitus; who presented to the hospital secondary to general malaise and fever.  Symptoms have been present for the last 24 to 48 hours and worsening.  Patient reports no coughing spells, sick contacts, dysuria, hematuria, melena or hematochezia.  There has not been any focal weaknesses.   Of note, patient with diabetic foot ulcer in his left great toe present for the last couple of weeks and with changes suggesting active cellulitis and concerns for early osteomyelitis.   Fluid resuscitation given, cultures taken and IV antibiotics has been started.  Podiatrist (Dr. Blinda) consulted and will see patient in consultation.    Assessment & Plan:   Principal Problem:   Cellulitis Prediabetic Chronic atrial fibrillation, anticoagulated Hypertension Hyperlipidemia Thyroidism COPD   Assessment and Plan:  Sepsis due to diabetic foot ulcer/cellulitis/bacteremia -Blood cultures are growing: Enterobacter /Klebsiella -Repeat blood cultures obtained 12/10/2023  POA-met SIRS, early sepsis criteria-source of infection diabetic toe ulcer with cellulitis - Improved sepsis physiology  -monitor IV fluids, IV antibiotics - MRI of foot: No with mild edema in the tuft of the distal phalanx of the great toe as above. This is likely reactive. Mild early  osteomyelitis cannot be entirely exclude - Podiatry service has been contacted - Currently on IV vancomycin  >>> per culture switching to Levaquin   Podiatrist (Dr. Blinda) consulted: Planning for partial left great toe amputation on Friday, 12/11/2023  H/o type 2 diabetes Per patient not diabetic, Prediabetic -not on any medications at home _ A1c: 5.8 ---6.0  - Continue to check CBG q. ACHS, SI coverage    Chronic atrial fibrillation - Continue beta-blocker for rate control - Holding home medication of Coumadin  -in anticipation of partial left toe amputation on Friday -INR 2.2>> 2.9, 2.4 - Evaluating patient for alternative DOAC   Hypertension - Hypotensive-holding BP meds   Hyperlipidemia - Continue statin.   Hypothyroidism - Continue Synthroid  - TSH: 1.8   History of asthma/COPD - Continue as needed bronchodilator management - Pulmicort  has been started - Will provide mucolytic's - No frank exacerbation currently appreciated.   History of BPH - Continue treatment with Proscar .      --------------------------------------------------------------------------------------------------------------------------- Nutritional status:  The patient's BMI is: Body mass index is 25.21 kg/m. I agree with the assessment and plan as outlined  Nutrition Status:     -------------------------------------------------------------------------------------------------------------------------- Cultures; Blood Cultures x 2 >> Enterobacter, Klebsiella Repeating blood cultures 12/10/2023 >>     ---------------------------------------------------------------------------------------------------------------------------  DVT prophylaxis:  Place and maintain sequential compression device Start: 12/08/23 1900   Code Status:   Code Status:  Full Code  Family Communication: No family member present at bedside-  -Advance care planning has been discussed.   Admission status:   Status is:  Inpatient Remains inpatient appropriate because: Needing IV antibiotics-consult evaluation for further evaluation for surgical intervention   Disposition: From  - home             Planning for discharge in 1-2 days   Procedures:   No admission procedures for hospital encounter.   Antimicrobials:  Anti-infectives (From admission, onward)    Start     Dose/Rate Route Frequency Ordered Stop   12/09/23 1300  vancomycin  (VANCOREADY) IVPB 1250 mg/250 mL  Status:  Discontinued        1,250 mg 166.7 mL/hr over 90 Minutes Intravenous Every 24 hours 12/08/23 1202 12/09/23 1138   12/09/23 1300  levofloxacin  (LEVAQUIN ) IVPB 750 mg        750 mg 100 mL/hr over 90 Minutes Intravenous Every 24 hours 12/09/23 1138     12/08/23 1300  vancomycin  (VANCOREADY) IVPB 1500 mg/300 mL        1,500 mg 150 mL/hr over 120 Minutes Intravenous  Once 12/08/23 1148 12/08/23 1450   12/08/23 1030  vancomycin  (VANCOCIN ) IVPB 1000 mg/200 mL premix  Status:  Discontinued        1,000 mg 200 mL/hr over 60 Minutes Intravenous  Once 12/08/23 1021 12/08/23 1022   12/08/23 1030  vancomycin  (VANCOREADY) IVPB 1500 mg/300 mL  Status:  Discontinued        1,500 mg 150 mL/hr over 120 Minutes Intravenous  Once 12/08/23 1024 12/08/23 1155   12/08/23 0900  levofloxacin  (LEVAQUIN ) IVPB 750 mg        750 mg 100 mL/hr over 90 Minutes Intravenous  Once 12/08/23 0855 12/08/23 1228        Medication:   budesonide  (PULMICORT ) nebulizer solution  0.5 mg Nebulization BID   carvedilol   25 mg Oral BID WC   dextromethorphan -guaiFENesin   1 tablet Oral BID   finasteride   5 mg Oral Daily   furosemide   20 mg Oral Daily   insulin  aspart  0-5 Units Subcutaneous QHS   insulin  aspart  0-9 Units Subcutaneous TID WC   isosorbide  mononitrate  60 mg Oral Daily   levothyroxine   175 mcg Oral QAC breakfast   rosuvastatin   10 mg Oral Daily    acetaminophen  **OR** acetaminophen , HYDROmorphone  (DILAUDID ) injection, ipratropium-albuterol ,  ondansetron  **OR** ondansetron  (ZOFRAN ) IV, sodium chloride    Objective:   Vitals:   12/09/23 1441 12/09/23 1956 12/10/23 0500 12/10/23 0729  BP: 106/65 126/73 (!) 106/55   Pulse: 83 87 80   Resp: 18 20    Temp: 97.6 F (36.4 C) 98 F (36.7 C) 98.1 F (36.7 C)   TempSrc: Oral Oral Oral   SpO2: 96% 98% 96% 96%  Weight:   82 kg   Height:        Intake/Output Summary (Last 24 hours) at 12/10/2023 0918 Last data filed at 12/10/2023 0831 Gross per 24 hour  Intake 622.2 ml  Output 1250 ml  Net -627.8 ml   Filed Weights   12/08/23 1437 12/09/23 0517 12/10/23 0500  Weight: 83.6 kg 81.6 kg 82 kg     Physical examination:        General:  AAO x 3,  cooperative, no distress;   HEENT:  Normocephalic, PERRL, otherwise with in Normal limits   Neuro:  CNII-XII intact. , normal motor and sensation, reflexes intact   Lungs:   Clear to  auscultation BL, Respirations unlabored,  No wheezes / crackles  Cardio:    S1/S2, RRR, No murmure, No Rubs or Gallops   Abdomen:  Soft, non-tender, bowel sounds active all four quadrants, no guarding or peritoneal signs.  Muscular  skeletal:  Limited exam -global generalized weaknesses - in bed, able to move all 4 extremities,   2+ pulses,  symmetric, No pitting edema  Skin:  Dry, warm to touch, left great toe wound  Wounds: Left great toe wound on the sole, with drainage dressing in place          LABs:     Latest Ref Rng & Units 12/09/2023    3:45 AM 12/08/2023    9:04 AM 07/04/2022   10:36 AM  CBC  WBC 4.0 - 10.5 K/uL 10.7  8.8  6.5   Hemoglobin 13.0 - 17.0 g/dL 89.3  87.1  87.0   Hematocrit 39.0 - 52.0 % 31.9  39.6  39.2   Platelets 150 - 400 K/uL 131  149  144       Latest Ref Rng & Units 12/09/2023    3:45 AM 12/08/2023    9:04 AM 06/23/2023   11:05 AM  CMP  Glucose 70 - 99 mg/dL 94  889  899   BUN 8 - 23 mg/dL 26  21  22    Creatinine 0.61 - 1.24 mg/dL 8.90  8.94  9.01   Sodium 135 - 145 mmol/L 137  137  141   Potassium 3.5 -  5.1 mmol/L 3.1  4.3  4.6   Chloride 98 - 111 mmol/L 106  103  103   CO2 22 - 32 mmol/L 22  23  25    Calcium  8.9 - 10.3 mg/dL 8.4  9.1  9.2   Total Protein 6.5 - 8.1 g/dL  7.1    Total Bilirubin 0.0 - 1.2 mg/dL  2.2    Alkaline Phos 38 - 126 U/L  60    AST 15 - 41 U/L  25    ALT 0 - 44 U/L  26         Micro Results Recent Results (from the past 240 hours)  Blood Culture (routine x 2)     Status: None (Preliminary result)   Collection Time: 12/08/23  9:04 AM   Specimen: BLOOD  Result Value Ref Range Status   Specimen Description   Final    BLOOD LEFT ANTECUBITAL Performed at [facility], [address], [place], [place] [address]    Special Requests   Final    BOTTLES DRAWN AEROBIC AND ANAEROBIC Blood Culture adequate volume Performed at [facility], [address], [place], [place] [address]    Culture  Setup Time   Final    AEROBIC BOTTLE ONLY GRAM NEGATIVE RODS Gram Stain Report Called to,Read Back By and Verified With: W. EARLY AT 1350 ON 09.03.25 BY ADGER J GRAM STAIN REVIEWED-AGREE WITH RESULT DRT ANAEROBIC BOTTLE ONLY GRAM VARIABLE ROD GRAM POSITIVE COCCI GRAM VARIABLE ROD CRITICAL RESULT CALLED TO, READ BACK BY AND VERIFIED WITH: CHRISTELLA MAYER RN 12/10/2023 @ 0108 BY AB Performed at [facility], [address], [place], [place] [address]    Culture GRAM NEGATIVE RODS  Final   Report Status PENDING  Incomplete  Blood Culture (routine x 2)     Status: None (Preliminary result)   Collection Time: 12/08/23  9:09 AM   Specimen: BLOOD RIGHT HAND  Result Value Ref Range Status   Specimen Description  Final    BLOOD RIGHT HAND Performed at [facility], [address] [place], [place] [address]    Special Requests   Final    BOTTLES DRAWN AEROBIC AND ANAEROBIC Blood Culture results may not be optimal due to an inadequate volume of blood received in culture bottles Performed at [facility], [address], [place], [place] [address]    Culture  Setup Time    Final    IN BOTH AEROBIC AND ANAEROBIC BOTTLES GRAM NEGATIVE RODS Gram Stain Report Called to,Read Back By and Verified With: CSABRA MORALE RN 12/09/23 @0649  BY J. WHITE GRAM STAIN REVIEWED-AGREE WITH RESULT DRT    Culture   Final    GRAM NEGATIVE RODS CULTURE REINCUBATED FOR BETTER GROWTH Performed at [facility], [address], [place], [place] [address]    Report Status PENDING  Incomplete  Blood Culture ID Panel (Reflexed)     Status: Abnormal   Collection Time: 12/08/23  9:09 AM  Result Value Ref Range Status   Enterococcus faecalis NOT DETECTED NOT DETECTED Final   Enterococcus Faecium NOT DETECTED NOT DETECTED Final   Listeria monocytogenes NOT DETECTED NOT DETECTED Final   Staphylococcus species NOT DETECTED NOT DETECTED Final   Staphylococcus aureus (BCID) NOT DETECTED NOT DETECTED Final   Staphylococcus epidermidis NOT DETECTED NOT DETECTED Final   Staphylococcus lugdunensis NOT DETECTED NOT DETECTED Final   Streptococcus species NOT DETECTED NOT DETECTED Final   Streptococcus agalactiae NOT DETECTED NOT DETECTED Final   Streptococcus pneumoniae NOT DETECTED NOT DETECTED Final   Streptococcus pyogenes NOT DETECTED NOT DETECTED Final   A.calcoaceticus-baumannii NOT DETECTED NOT DETECTED Final   Bacteroides fragilis NOT DETECTED NOT DETECTED Final   Enterobacterales DETECTED (A) NOT DETECTED Final    Comment: CRITICAL RESULT CALLED TO, READ BACK BY AND VERIFIED WITHBETHA PHEBE BLUSH PHARMD, AT 1102 12/09/23 D. VANHOOK    Enterobacter cloacae complex DETECTED (A) NOT DETECTED Final    Comment: CRITICAL RESULT CALLED TO, READ BACK BY AND VERIFIED WITH: PHEBE BLUSH PHARMD, AT 1102 12/09/23 D. VANHOOK    Escherichia coli NOT DETECTED NOT DETECTED Final   Klebsiella aerogenes DETECTED (A) NOT DETECTED Final    Comment: CRITICAL RESULT CALLED TO, READ BACK BY AND VERIFIED WITH: PHEBE BLUSH PHARMD, AT 1102 12/09/23 D. VANHOOK    Klebsiella oxytoca NOT DETECTED NOT DETECTED Final    Klebsiella pneumoniae NOT DETECTED NOT DETECTED Final   Proteus species NOT DETECTED NOT DETECTED Final   Salmonella species NOT DETECTED NOT DETECTED Final   Serratia marcescens NOT DETECTED NOT DETECTED Final   Haemophilus influenzae NOT DETECTED NOT DETECTED Final   Neisseria meningitidis NOT DETECTED NOT DETECTED Final   Pseudomonas aeruginosa NOT DETECTED NOT DETECTED Final   Stenotrophomonas maltophilia NOT DETECTED NOT DETECTED Final   Candida albicans NOT DETECTED NOT DETECTED Final   Candida auris NOT DETECTED NOT DETECTED Final   Candida glabrata NOT DETECTED NOT DETECTED Final   Candida krusei NOT DETECTED NOT DETECTED Final   Candida parapsilosis NOT DETECTED NOT DETECTED Final   Candida tropicalis NOT DETECTED NOT DETECTED Final   Cryptococcus neoformans/gattii NOT DETECTED NOT DETECTED Final   CTX-M ESBL NOT DETECTED NOT DETECTED Final   Carbapenem resistance IMP NOT DETECTED NOT DETECTED Final   Carbapenem resistance KPC NOT DETECTED NOT DETECTED Final   Carbapenem resistance NDM NOT DETECTED NOT DETECTED Final   Carbapenem resist OXA 48 LIKE NOT DETECTED NOT DETECTED Final   Carbapenem resistance VIM  NOT DETECTED NOT DETECTED Final    Comment: Performed at [facility], [address] [place], [place] [address]  Resp panel by RT-PCR (RSV, Flu A&B, Covid) Anterior Nasal Swab     Status: None   Collection Time: 12/08/23  9:34 AM   Specimen: Anterior Nasal Swab  Result Value Ref Range Status   SARS Coronavirus 2 by RT PCR NEGATIVE NEGATIVE Final    Comment: (NOTE) SARS-CoV-2 target nucleic acids are NOT DETECTED.  The SARS-CoV-2 RNA is generally detectable in upper respiratory specimens during the acute phase of infection. The lowest concentration of SARS-CoV-2 viral copies this assay can detect is 138 copies/mL. A negative result does not preclude SARS-Cov-2 infection and should not be used as the sole basis for treatment or other patient management decisions.  A negative result may occur with  improper specimen collection/handling, submission of specimen other than nasopharyngeal swab, presence of viral mutation(s) within the areas targeted by this assay, and inadequate number of viral copies(<138 copies/mL). A negative result must be combined with clinical observations, patient history, and epidemiological information. The expected result is Negative.  Fact Sheet for Patients:  BloggerCourse.com  Fact Sheet for Healthcare Providers:  SeriousBroker.it  This test is no t yet approved or cleared by the [place]  FDA and  has been authorized for detection and/or diagnosis of SARS-CoV-2 by FDA under an Emergency Use Authorization (EUA). This EUA will remain  in effect (meaning this test can be used) for the duration of the COVID-19 declaration under Section 564(b)(1) of the Act, 21 U.S.C.section 360bbb-3(b)(1), unless the authorization is terminated  or revoked sooner.       Influenza A by PCR NEGATIVE NEGATIVE Final   Influenza B by PCR NEGATIVE NEGATIVE Final    Comment: (NOTE) The Xpert Xpress SARS-CoV-2/FLU/RSV plus assay is intended as an aid in the diagnosis of influenza from Nasopharyngeal swab specimens and should not be used as a sole basis for treatment. Nasal washings and aspirates are unacceptable for Xpert Xpress SARS-CoV-2/FLU/RSV testing.  Fact Sheet for Patients: BloggerCourse.com  Fact Sheet for Healthcare Providers: SeriousBroker.it  This test is not yet approved or cleared by the [place]  FDA and has been authorized for detection and/or diagnosis of SARS-CoV-2 by FDA under an Emergency Use Authorization (EUA). This EUA will remain in effect (meaning this test can be used) for the duration of the COVID-19 declaration under Section 564(b)(1) of the Act, 21 U.S.C. section 360bbb-3(b)(1), unless the authorization  is terminated or revoked.     Resp Syncytial Virus by PCR NEGATIVE NEGATIVE Final    Comment: (NOTE) Fact Sheet for Patients: BloggerCourse.com  Fact Sheet for Healthcare Providers: SeriousBroker.it  This test is not yet approved or cleared by the [place]  FDA and has been authorized for detection and/or diagnosis of SARS-CoV-2 by FDA under an Emergency Use Authorization (EUA). This EUA will remain in effect (meaning this test can be used) for the duration of the COVID-19 declaration under Section 564(b)(1) of the Act, 21 U.S.C. section 360bbb-3(b)(1), unless the authorization is terminated or revoked.  Performed at [facility], [address], [place], [place] [address]   Aerobic Culture w Gram Stain (superficial specimen)     Status: None (Preliminary result)   Collection Time: 12/08/23  5:55 PM   Specimen: Toe; Wound  Result Value Ref Range Status   Specimen Description   Final    TOE LEFT Performed at [facility], [address], [place], [place] [address]  Special Requests   Final    Normal Performed at [facility], [address], [place], [place] [address]    Gram Stain NO WBC SEEN NO ORGANISMS SEEN   Final   Culture   Final    CULTURE REINCUBATED FOR BETTER GROWTH Performed at [facility], [address], [place], [place] [address]    Report Status PENDING  Incomplete  Blood Culture ID Panel (Reflexed)     Status: Abnormal   Collection Time: 12/09/23  9:03 AM  Result Value Ref Range Status   Enterococcus faecalis NOT DETECTED NOT DETECTED Final   Enterococcus Faecium NOT DETECTED NOT DETECTED Final   Listeria monocytogenes NOT DETECTED NOT DETECTED Final   Staphylococcus species NOT DETECTED NOT DETECTED Final   Staphylococcus aureus (BCID) NOT DETECTED NOT DETECTED Final   Staphylococcus epidermidis NOT DETECTED NOT DETECTED Final   Staphylococcus lugdunensis NOT DETECTED NOT DETECTED Final    Streptococcus species NOT DETECTED NOT DETECTED Final   Streptococcus agalactiae NOT DETECTED NOT DETECTED Final   Streptococcus pneumoniae NOT DETECTED NOT DETECTED Final   Streptococcus pyogenes NOT DETECTED NOT DETECTED Final   A.calcoaceticus-baumannii NOT DETECTED NOT DETECTED Final   Bacteroides fragilis NOT DETECTED NOT DETECTED Final   Enterobacterales DETECTED (A) NOT DETECTED Final    Comment: Enterobacterales represent a large order of gram negative bacteria, not a single organism. Refer to culture for further identification. CRITICAL RESULT CALLED TO, READ BACK BY AND VERIFIED WITH: M ATKINS RN 12/10/2023 @ 0108 BY AB    Enterobacter cloacae complex NOT DETECTED NOT DETECTED Final   Escherichia coli NOT DETECTED NOT DETECTED Final   Klebsiella aerogenes NOT DETECTED NOT DETECTED Final   Klebsiella oxytoca NOT DETECTED NOT DETECTED Final   Klebsiella pneumoniae NOT DETECTED NOT DETECTED Final   Proteus species NOT DETECTED NOT DETECTED Final   Salmonella species NOT DETECTED NOT DETECTED Final   Serratia marcescens NOT DETECTED NOT DETECTED Final   Haemophilus influenzae NOT DETECTED NOT DETECTED Final   Neisseria meningitidis NOT DETECTED NOT DETECTED Final   Pseudomonas aeruginosa NOT DETECTED NOT DETECTED Final   Stenotrophomonas maltophilia NOT DETECTED NOT DETECTED Final   Candida albicans NOT DETECTED NOT DETECTED Final   Candida auris NOT DETECTED NOT DETECTED Final   Candida glabrata NOT DETECTED NOT DETECTED Final   Candida krusei NOT DETECTED NOT DETECTED Final   Candida parapsilosis NOT DETECTED NOT DETECTED Final   Candida tropicalis NOT DETECTED NOT DETECTED Final   Cryptococcus neoformans/gattii NOT DETECTED NOT DETECTED Final   CTX-M ESBL NOT DETECTED NOT DETECTED Final   Carbapenem resistance IMP NOT DETECTED NOT DETECTED Final   Carbapenem resistance KPC NOT DETECTED NOT DETECTED Final   Carbapenem resistance NDM NOT DETECTED NOT DETECTED Final   Carbapenem  resist OXA 48 LIKE NOT DETECTED NOT DETECTED Final   Carbapenem resistance VIM NOT DETECTED NOT DETECTED Final    Comment: Performed at [facility], [address], [place], [place] [address]    Radiology Reports No results found.   SIGNED: Adriana DELENA Grams, MD, FHM. FAAFP. Jolynn Pack - [facility] Time spent - 55 min.  In seeing, evaluating and examining the patient. Reviewing medical records, labs, drawn plan of care. [facility],  Pager (please use [email or web address] to page/ text) Please use Epic Secure Chat for non-urgent communication (7AM-7PM)  If 7PM-7AM, please contact night-coverage [email or web address], 12/10/2023, 9:18 AM

## 2023-12-10 NOTE — Progress Notes (Addendum)
 PHARMACY - ANTICOAGULATION CONSULT NOTE  Pharmacy Consult for anticoagulation managementIndication: atrial fibrillation  Allergies  Allergen Reactions   Alpha Blocker Quinazolines     Tamsulosin  / Alfuzosin / Silodosin  caused adverse effects (weak urinary stream, increased sensations of incomplete bladder emptying, decreased urine output)   Carbapenems Other (See Comments)    Altered mental status   Cephalosporins     Unknown reaction   Nsaids Other (See Comments)    On blood thinner    Penicillins Swelling and Other (See Comments)    Did it involve swelling of the face/tongue/throat, SOB, or low BP? Unknown-possible swelling Did it involve sudden or severe rash/hives, skin peeling, or any reaction on the inside of your mouth or nose? Unknown Did you need to seek medical attention at a hospital or doctor's office? Unknown When did it last happen? Over 10 years (possible swelling)      If all above answers are NO, may proceed with cephalosporin use.     Patient Measurements: Height: 5' 11 (180.3 cm) Weight: 82 kg (180 lb 12.4 oz) IBW/kg (Calculated) : 75.3 HEPARIN  DW (KG): 83.6  Vital Signs: Temp: 98.1 F (36.7 C) (09/04 0500) Temp Source: Oral (09/04 0500) BP: 106/55 (09/04 0500) Pulse Rate: 80 (09/04 0500)  Labs: Recent Labs    12/08/23 0904 12/08/23 0910 12/08/23 1050 12/09/23 0345 12/10/23 0349  HGB 12.8*  --   --  10.6*  --   HCT 39.6  --   --  31.9*  --   PLT 149*  --   --  131*  --   LABPROT  --  26.0*  --  31.3* 27.4*  INR  --  2.2*  --  2.9* 2.4*  CREATININE 1.05  --   --  1.09  --   TROPONINIHS 12  --  22*  --   --     Estimated Creatinine Clearance: 48.9 mL/min (by C-G formula based on SCr of 1.09 mg/dL).   Medical History: Past Medical History:  Diagnosis Date   Adenomatous colon polyp 2001   Anxiety    Atrial fibrillation (HCC)    takes Coumadin  daily   BPH (benign prostatic hyperplasia)    takes Proscar  daily   CAD (coronary artery  disease) 2009   a. s/p PTCA and stenting of mid-LAD and LCx in 2009 b. NST in 03/2018 showing prior infarct with no current ischemia   Carpal tunnel syndrome    right   Complication of anesthesia    hard to wake up   Diverticulosis 2007   tcs by Dr. Golda   GERD (gastroesophageal reflux disease)    Gout    Hypercholesterolemia    takes Atorvastatin  daily   Hypertension    takes Imdur ,Coreg ,and Lisinopril  daily   Hypothyroidism    takes SYnthroid  daily   Joint pain    Joint swelling    Nocturia    Numbness    right hand pointer and middle finger   Osteoarthritis    left knee   Pneumonia    many,many yrs ago   Tubular adenoma 2001    Assessment: Pharmacy consulted to dose warfarin in patient with atrial fibrillation, consult then discontinued due to need for surgery. INR bumped to 2.9 yesterday and is now down to 2.4. Warfarin was never given, patient transitioned to levofloxacin  for bacteremia which can potentiate INR so will need to be cautious with dosing of warfarin at discharge. Discussion had with podiatry on admit regarding anticoagulation. Current plan is  to hold warfarin for now, start heparin  if INR drifts below 2 prior to surgery which is planned for Friday afternoon.   Home dose listed as 2.5 mg on Tue+Thur+Sat and 5 mg ROW.  Goal of Therapy:  INR 2-3 Monitor platelets by anticoagulation protocol: Yes   Plan:  Hold warfarin for now Will check INR daily, if it drifts below 2 will plan to start IV heparin .   Dempsey Blush PharmD., BCPS Clinical Pharmacist 12/10/2023 8:55 AM

## 2023-12-11 ENCOUNTER — Inpatient Hospital Stay (HOSPITAL_COMMUNITY): Admitting: Anesthesiology

## 2023-12-11 ENCOUNTER — Encounter (HOSPITAL_COMMUNITY)
Admission: EM | Disposition: A | Discharge status: Home Health | Payer: Self-pay | Source: Home / Self Care | Attending: Family Medicine

## 2023-12-11 ENCOUNTER — Inpatient Hospital Stay (HOSPITAL_COMMUNITY)

## 2023-12-11 DIAGNOSIS — M869 Osteomyelitis, unspecified: Secondary | ICD-10-CM

## 2023-12-11 DIAGNOSIS — R7881 Bacteremia: Secondary | ICD-10-CM

## 2023-12-11 DIAGNOSIS — I509 Heart failure, unspecified: Secondary | ICD-10-CM

## 2023-12-11 DIAGNOSIS — L03032 Cellulitis of left toe: Secondary | ICD-10-CM | POA: Diagnosis not present

## 2023-12-11 DIAGNOSIS — I2510 Atherosclerotic heart disease of native coronary artery without angina pectoris: Secondary | ICD-10-CM

## 2023-12-11 DIAGNOSIS — I110 Hypertensive heart disease with heart failure: Secondary | ICD-10-CM

## 2023-12-11 HISTORY — PX: AMPUTATION TOE: SHX6595

## 2023-12-11 LAB — AEROBIC CULTURE W GRAM STAIN (SUPERFICIAL SPECIMEN)
Culture: NO GROWTH
Gram Stain: NONE SEEN
Special Requests: NORMAL

## 2023-12-11 LAB — CBC
HCT: 33 % — ABNORMAL LOW (ref 39.0–52.0)
Hemoglobin: 10.9 g/dL — ABNORMAL LOW (ref 13.0–17.0)
MCH: 31.1 pg (ref 26.0–34.0)
MCHC: 33 g/dL (ref 30.0–36.0)
MCV: 94.3 fL (ref 80.0–100.0)
Platelets: 150 K/uL (ref 150–400)
RBC: 3.5 MIL/uL — ABNORMAL LOW (ref 4.22–5.81)
RDW: 14.9 % (ref 11.5–15.5)
WBC: 7 K/uL (ref 4.0–10.5)
nRBC: 0 % (ref 0.0–0.2)

## 2023-12-11 LAB — GLUCOSE, CAPILLARY
Glucose-Capillary: 90 mg/dL (ref 70–99)
Glucose-Capillary: 97 mg/dL (ref 70–99)
Glucose-Capillary: 74 mg/dL (ref 70–99)
Glucose-Capillary: 103 mg/dL — ABNORMAL HIGH (ref 70–99)
Glucose-Capillary: 129 mg/dL — ABNORMAL HIGH (ref 70–99)

## 2023-12-11 LAB — BASIC METABOLIC PANEL WITH GFR
Anion gap: 7 (ref 5–15)
BUN: 24 mg/dL — ABNORMAL HIGH (ref 8–23)
CO2: 25 mmol/L (ref 22–32)
Calcium: 8.2 mg/dL — ABNORMAL LOW (ref 8.9–10.3)
Chloride: 108 mmol/L (ref 98–111)
Creatinine, Ser: 0.92 mg/dL (ref 0.61–1.24)
GFR, Estimated: >60 mL/min (ref >60)
Glucose, Bld: 97 mg/dL (ref 70–99)
Potassium: 3 mmol/L — ABNORMAL LOW (ref 3.5–5.1)
Sodium: 140 mmol/L (ref 135–145)

## 2023-12-11 LAB — PROTIME-INR
INR: 1.6 — ABNORMAL HIGH (ref 0.8–1.2)
Prothrombin Time: 20.2 s — ABNORMAL HIGH (ref 11.4–15.2)

## 2023-12-11 LAB — SURGICAL PCR SCREEN
MRSA, PCR: NEGATIVE
Staphylococcus aureus: NEGATIVE

## 2023-12-11 SURGERY — AMPUTATION, TOE
Anesthesia: General | Site: Toe | Laterality: Left

## 2023-12-11 MED ORDER — CHLORHEXIDINE GLUCONATE 0.12 % MT SOLN
15.0000 mL | Freq: Once | OROMUCOSAL | Status: AC
  Administered 2023-12-11: 15 mL via OROMUCOSAL

## 2023-12-11 MED ORDER — LACTATED RINGERS IV SOLN: INTRAVENOUS | Status: DC

## 2023-12-11 MED ORDER — POTASSIUM CHLORIDE CRYS ER 20 MEQ PO TBCR
20.0000 meq | EXTENDED_RELEASE_TABLET | Freq: Once | ORAL | Status: AC
  Administered 2023-12-11: 20 meq via ORAL
  Filled 2023-12-11: qty 1

## 2023-12-11 MED ORDER — BUPIVACAINE HCL (PF) 0.5 % IJ SOLN
INTRAMUSCULAR | Status: DC | PRN
  Administered 2023-12-11 (×2): 10 mL

## 2023-12-11 MED ORDER — BUPIVACAINE HCL (PF) 0.5 % IJ SOLN
INTRAMUSCULAR | Status: AC
  Filled 2023-12-11: qty 30

## 2023-12-11 MED ORDER — FENTANYL CITRATE PF 50 MCG/ML IJ SOSY: 25.0000 ug | PREFILLED_SYRINGE | INTRAMUSCULAR | Status: DC | PRN

## 2023-12-11 MED ORDER — FUROSEMIDE 20 MG PO TABS
20.0000 mg | ORAL_TABLET | Freq: Every day | ORAL | Status: DC
  Administered 2023-12-12: 20 mg via ORAL
  Filled 2023-12-11: qty 1

## 2023-12-11 MED ORDER — 0.9 % SODIUM CHLORIDE (POUR BTL) OPTIME
TOPICAL | Status: DC | PRN
  Administered 2023-12-11: 1000 mL

## 2023-12-11 MED ORDER — PROPOFOL 500 MG/50ML IV EMUL
INTRAVENOUS | Status: DC | PRN
Start: 2023-12-11 — End: 2023-12-11
  Administered 2023-12-11: 30 ug/kg/min via INTRAVENOUS

## 2023-12-11 MED ORDER — PROPOFOL 10 MG/ML IV BOLUS
INTRAVENOUS | Status: DC | PRN
  Administered 2023-12-11: 70 mg via INTRAVENOUS

## 2023-12-11 MED ORDER — FENTANYL CITRATE (PF) 100 MCG/2ML IJ SOLN
INTRAMUSCULAR | Status: AC
Start: 2023-12-11 — End: 2023-12-11
  Filled 2023-12-11: qty 2

## 2023-12-11 MED ORDER — ORAL CARE MOUTH RINSE: 15.0000 mL | Freq: Once | OROMUCOSAL | Status: AC

## 2023-12-11 MED ORDER — POTASSIUM CHLORIDE CRYS ER 20 MEQ PO TBCR: 40.0000 meq | EXTENDED_RELEASE_TABLET | Freq: Once | ORAL | Status: DC

## 2023-12-11 MED ORDER — PROPOFOL 10 MG/ML IV BOLUS
INTRAVENOUS | Status: AC
  Filled 2023-12-11: qty 20

## 2023-12-11 MED ORDER — POTASSIUM CHLORIDE 10 MEQ/100ML IV SOLN
10.0000 meq | INTRAVENOUS | Status: AC
  Administered 2023-12-11 (×2): 10 meq via INTRAVENOUS
  Filled 2023-12-11: qty 100

## 2023-12-11 MED ORDER — ROSUVASTATIN CALCIUM 10 MG PO TABS
10.0000 mg | ORAL_TABLET | Freq: Every day | ORAL | Status: DC
  Administered 2023-12-12: 10 mg via ORAL
  Filled 2023-12-11: qty 1

## 2023-12-11 SURGICAL SUPPLY — 26 items
BANDAGE ESMARK 4X12 BL STRL LF (DISPOSABLE) ×2 IMPLANT
BLADE SURG 15 STRL LF DISP TIS (BLADE) ×2 IMPLANT
BNDG ELASTIC 4X5.8 VLCR NS LF (GAUZE/BANDAGES/DRESSINGS) ×2 IMPLANT
BNDG GAUZE DERMACEA FLUFF 4 (GAUZE/BANDAGES/DRESSINGS) IMPLANT
COVER LIGHT HANDLE STERIS (MISCELLANEOUS) ×4 IMPLANT
CUFF TOURN SGL QUICK 18X4 (TOURNIQUET CUFF) ×2 IMPLANT
DRSG ADAPTIC 3X8 NADH LF (GAUZE/BANDAGES/DRESSINGS) ×2 IMPLANT
ELECTRODE REM PT RTRN 9FT ADLT (ELECTROSURGICAL) ×2 IMPLANT
GAUZE SPONGE 4X4 12PLY STRL (GAUZE/BANDAGES/DRESSINGS) ×4 IMPLANT
GLOVE BIO SURGEON STRL SZ7.5 (GLOVE) ×2 IMPLANT
GLOVE BIOGEL PI IND STRL 7.0 (GLOVE) ×4 IMPLANT
GOWN STRL REUS W/TWL LRG LVL3 (GOWN DISPOSABLE) ×4 IMPLANT
KIT TURNOVER KIT A (KITS) ×2 IMPLANT
MANIFOLD NEPTUNE II (INSTRUMENTS) ×2 IMPLANT
NDL HYPO 27GX1-1/4 (NEEDLE) ×4 IMPLANT
NEEDLE HYPO 27GX1-1/4 (NEEDLE) ×2 IMPLANT
NS IRRIG 1000ML POUR BTL (IV SOLUTION) ×2 IMPLANT
PACK BASIC LIMB (CUSTOM PROCEDURE TRAY) ×2 IMPLANT
PAD ABD 5X9 TENDERSORB (GAUZE/BANDAGES/DRESSINGS) IMPLANT
PAD ARMBOARD POSITIONER FOAM (MISCELLANEOUS) ×2 IMPLANT
POSITIONER HEAD 8X9X4 ADT (SOFTGOODS) ×2 IMPLANT
SET BASIN LINEN APH (SET/KITS/TRAYS/PACK) ×2 IMPLANT
SUT ETHILON 3 0 PS 1 (SUTURE) ×2 IMPLANT
SUT PROLENE NAB BLUE 3-0 30IN (SUTURE) IMPLANT
SUT VICRYL AB 3-0 FS1 BRD 27IN (SUTURE) ×2 IMPLANT
SYR CONTROL 10ML LL (SYRINGE) ×4 IMPLANT

## 2023-12-11 NOTE — Progress Notes (Signed)
 PROGRESS NOTE    Patient: Jerry Cain                            PCP: Colette Torrence GRADE, MD                    DOB: Feb 14, 1932            DOA: 12/08/2023 FMW:[id number]             DOS: 12/11/2023, 10:56 AM   LOS: 3 days   Date of Service: The patient was seen and examined on 12/11/2023  Subjective:   The patient was seen and examined this morning stable no acute distress N.p.o. In anticipation of surgical invention-possible partial toe amputation today Hemodynamically stable  Brief Narrative:   Jerry Cain is a 89 y.o. male with medical history significant of BPH, chronic atrial fibrillation on Coumadin , GERD, hyperlipidemia, hypertension, hypothyroidism, history of asthma/COPD and and type 2 diabetes mellitus; who presented to the hospital secondary to general malaise and fever.  Symptoms have been present for the last 24 to 48 hours and worsening.  Patient reports no coughing spells, sick contacts, dysuria, hematuria, melena or hematochezia.  There has not been any focal weaknesses.   Of note, patient with diabetic foot ulcer in his left great toe present for the last couple of weeks and with changes suggesting active cellulitis and concerns for early osteomyelitis.   Fluid resuscitation given, cultures taken and IV antibiotics has been started.  Podiatrist (Dr. Blinda) consulted and will see patient in consultation.    Assessment & Plan:   Principal Problem:   Cellulitis Prediabetic Chronic atrial fibrillation, anticoagulated Hypertension Hyperlipidemia Thyroidism COPD   Assessment and Plan:  Sepsis due to diabetic foot ulcer/cellulitis/bacteremia -Resolved sepsis physiology, hemodynamically stable -Blood cultures are growing: Enterobacter /Klebsiella - pansensitive -Repeat blood cultures obtained 12/10/2023 -no growth to date  POA-met SIRS, early sepsis criteria-source of infection diabetic toe ulcer with cellulitis - Improved sepsis  physiology  -monitor IV fluids, IV antibiotics - MRI of foot: No with mild edema in the tuft of the distal phalanx of the great toe as above. This is likely reactive. Mild early osteomyelitis cannot be entirely exclude - Podiatry service has been contacted - Currently on IV vancomycin  >>> per culture switching to Levaquin   Podiatrist (Dr. Blinda) consulted: Planning for partial left great toe amputation on Friday, 12/11/2023  H/o type 2 diabetes Per patient not diabetic, Prediabetic -not on any medications at home _ A1c: 5.8 ---6.0  - Continue to check CBG q. ACHS, SI coverage    Chronic atrial fibrillation - Continue beta-blocker for rate control - Holding home medication of Coumadin  -in anticipation of partial left toe amputation on Friday -INR 2.2>> 2.9, 2.4>>> 1.6 - Evaluating patient for alternative DOAC   Hypertension - Hypotensive-holding BP meds   Hyperlipidemia - Continue statin.   Hypothyroidism - Continue Synthroid  - TSH: 1.8   History of asthma/COPD -Stable on room air, no signs of exacerbation - Continue as needed bronchodilator management - Pulmicort  has been started - Will provide mucolytic's    History of BPH - Continue treatment with Proscar .      --------------------------------------------------------------------------------------------------------------------------- Nutritional status:  The patient's BMI is: Body mass index is 25.34 kg/m. I agree with the assessment and plan as outlined  Nutrition Status:     -------------------------------------------------------------------------------------------------------------------------- Cultures; Blood Cultures x 2 >> Enterobacter, Klebsiella Repeating blood cultures 12/10/2023 >>     ---------------------------------------------------------------------------------------------------------------------------  DVT prophylaxis:  Place and maintain sequential compression device Start: 12/08/23  1900   Code Status:   Code Status: Full Code  Family Communication: No family member present at bedside-  -Advance care planning has been discussed.   Admission status:   Status is: Inpatient Remains inpatient appropriate because: Needing IV antibiotics-consult evaluation for further evaluation for surgical intervention   Disposition: From  - home             Planning for discharge in 1-2 days   Procedures:   No admission procedures for hospital encounter.   Antimicrobials:  Anti-infectives (From admission, onward)    Start     Dose/Rate Route Frequency Ordered Stop   12/09/23 1300  vancomycin  (VANCOREADY) IVPB 1250 mg/250 mL  Status:  Discontinued        1,250 mg 166.7 mL/hr over 90 Minutes Intravenous Every 24 hours 12/08/23 1202 12/09/23 1138   12/09/23 1300  levofloxacin  (LEVAQUIN ) IVPB 750 mg        750 mg 100 mL/hr over 90 Minutes Intravenous Every 24 hours 12/09/23 1138     12/08/23 1300  vancomycin  (VANCOREADY) IVPB 1500 mg/300 mL        1,500 mg 150 mL/hr over 120 Minutes Intravenous  Once 12/08/23 1148 12/08/23 1450   12/08/23 1030  vancomycin  (VANCOCIN ) IVPB 1000 mg/200 mL premix  Status:  Discontinued        1,000 mg 200 mL/hr over 60 Minutes Intravenous  Once 12/08/23 1021 12/08/23 1022   12/08/23 1030  vancomycin  (VANCOREADY) IVPB 1500 mg/300 mL  Status:  Discontinued        1,500 mg 150 mL/hr over 120 Minutes Intravenous  Once 12/08/23 1024 12/08/23 1155   12/08/23 0900  levofloxacin  (LEVAQUIN ) IVPB 750 mg        750 mg 100 mL/hr over 90 Minutes Intravenous  Once 12/08/23 0855 12/08/23 1228        Medication:   budesonide  (PULMICORT ) nebulizer solution  0.5 mg Nebulization BID   carvedilol   25 mg Oral BID WC   dextromethorphan -guaiFENesin   1 tablet Oral BID   finasteride   5 mg Oral Daily   [START ON 12/12/2023] furosemide   20 mg Oral Daily   insulin  aspart  0-5 Units Subcutaneous QHS   insulin  aspart  0-9 Units Subcutaneous TID WC   isosorbide   mononitrate  60 mg Oral Daily   levothyroxine   175 mcg Oral QAC breakfast   potassium chloride   20 mEq Oral Once   [START ON 12/12/2023] rosuvastatin   10 mg Oral Daily    acetaminophen  **OR** acetaminophen , HYDROmorphone  (DILAUDID ) injection, ipratropium-albuterol , ondansetron  **OR** ondansetron  (ZOFRAN ) IV, sodium chloride    Objective:   Vitals:   12/10/23 2024 12/11/23 0500 12/11/23 0505 12/11/23 0805  BP: 133/86  (!) 152/87   Pulse: 82  78   Resp: 17  20   Temp: 97.7 F (36.5 C)  97.9 F (36.6 C)   TempSrc: Oral  Oral   SpO2: 96%  96% 95%  Weight:  82.4 kg    Height:        Intake/Output Summary (Last 24 hours) at 12/11/2023 1056 Last data filed at 12/10/2023 1839 Gross per 24 hour  Intake 480 ml  Output --  Net 480 ml   Filed Weights   12/09/23 0517 12/10/23 0500 12/11/23 0500  Weight: 81.6 kg 82 kg 82.4 kg     Physical examination:          General:  AAO x 3,  cooperative,  no distress;   HEENT:  Normocephalic, PERRL, otherwise with in Normal limits   Neuro:  CNII-XII intact. , normal motor and sensation, reflexes intact   Lungs:   Clear to auscultation BL, Respirations unlabored,  No wheezes / crackles  Cardio:    S1/S2, RRR, No murmure, No Rubs or Gallops   Abdomen:  Soft, non-tender, bowel sounds active all four quadrants, no guarding or peritoneal signs.  Muscular  skeletal:  Limited exam -global generalized weaknesses - in bed, able to move all 4 extremities,   Chronic left great toe wound, dressing in place 2+ pulses,  symmetric, No pitting edema  Skin:  Dry, warm to touch, left great toe wound     Wounds: Left great toe wound on the sole, with drainage dressing in place          LABs:     Latest Ref Rng & Units 12/11/2023    4:14 AM 12/09/2023    3:45 AM 12/08/2023    9:04 AM  CBC  WBC 4.0 - 10.5 K/uL 7.0  10.7  8.8   Hemoglobin 13.0 - 17.0 g/dL 89.0  89.3  87.1   Hematocrit 39.0 - 52.0 % 33.0  31.9  39.6   Platelets 150 - 400 K/uL 150   131  149       Latest Ref Rng & Units 12/11/2023    4:14 AM 12/09/2023    3:45 AM 12/08/2023    9:04 AM  CMP  Glucose 70 - 99 mg/dL 97  94  889   BUN 8 - 23 mg/dL 24  26  21    Creatinine 0.61 - 1.24 mg/dL 9.07  8.90  8.94   Sodium 135 - 145 mmol/L 140  137  137   Potassium 3.5 - 5.1 mmol/L 3.0  3.1  4.3   Chloride 98 - 111 mmol/L 108  106  103   CO2 22 - 32 mmol/L 25  22  23    Calcium  8.9 - 10.3 mg/dL 8.2  8.4  9.1   Total Protein 6.5 - 8.1 g/dL   7.1   Total Bilirubin 0.0 - 1.2 mg/dL   2.2   Alkaline Phos 38 - 126 U/L   60   AST 15 - 41 U/L   25   ALT 0 - 44 U/L   26        Micro Results Recent Results (from the past 240 hours)  Blood Culture (routine x 2)     Status: None (Preliminary result)   Collection Time: 12/08/23  9:04 AM   Specimen: BLOOD  Result Value Ref Range Status   Specimen Description   Final    BLOOD LEFT ANTECUBITAL Performed at [facility], [address], [place], [place] [address]    Special Requests   Final    BOTTLES DRAWN AEROBIC AND ANAEROBIC Blood Culture adequate volume Performed at [facility], [address] [place], [place] [address]    Culture  Setup Time   Final    AEROBIC BOTTLE ONLY GRAM NEGATIVE RODS Gram Stain Report Called to,Read Back By and Verified With: W. EARLY AT 1350 ON 09.03.25 BY ADGER J GRAM STAIN REVIEWED-AGREE WITH RESULT DRT ANAEROBIC BOTTLE ONLY GRAM VARIABLE ROD GRAM POSITIVE COCCI GRAM VARIABLE ROD CRITICAL RESULT CALLED TO, READ BACK BY AND VERIFIED WITH: CHRISTELLA MAYER RN 12/10/2023 @ 0108 BY AB    Culture   Final    GRAM NEGATIVE RODS CULTURE REINCUBATED FOR BETTER GROWTH IDENTIFICATION AND SUSCEPTIBILITIES  TO FOLLOW Performed at [facility], [address], [place], [place] [address]    Report Status PENDING  Incomplete  Blood Culture (routine x 2)     Status: None (Preliminary result)   Collection Time: 12/08/23  9:09 AM   Specimen: BLOOD RIGHT HAND  Result Value Ref Range Status   Specimen Description    Final    BLOOD RIGHT HAND Performed at [facility], [address] [place], [place] [address]    Special Requests   Final    BOTTLES DRAWN AEROBIC AND ANAEROBIC Blood Culture results may not be optimal due to an inadequate volume of blood received in culture bottles Performed at [facility], [address], [place], [place] [address]    Culture  Setup Time   Final    IN BOTH AEROBIC AND ANAEROBIC BOTTLES GRAM NEGATIVE RODS Gram Stain Report Called to,Read Back By and Verified With: C. DRAUGHN RN 12/09/23 @0649  BY J. WHITE GRAM STAIN REVIEWED-AGREE WITH RESULT DRT    Culture   Final    GRAM NEGATIVE RODS CULTURE REINCUBATED FOR BETTER GROWTH IDENTIFICATION AND SUSCEPTIBILITIES TO FOLLOW Performed at [facility], [address], [place], [place] [address]    Report Status PENDING  Incomplete  Blood Culture ID Panel (Reflexed)     Status: Abnormal   Collection Time: 12/08/23  9:09 AM  Result Value Ref Range Status   Enterococcus faecalis NOT DETECTED NOT DETECTED Final   Enterococcus Faecium NOT DETECTED NOT DETECTED Final   Listeria monocytogenes NOT DETECTED NOT DETECTED Final   Staphylococcus species NOT DETECTED NOT DETECTED Final   Staphylococcus aureus (BCID) NOT DETECTED NOT DETECTED Final   Staphylococcus epidermidis NOT DETECTED NOT DETECTED Final   Staphylococcus lugdunensis NOT DETECTED NOT DETECTED Final   Streptococcus species NOT DETECTED NOT DETECTED Final   Streptococcus agalactiae NOT DETECTED NOT DETECTED Final   Streptococcus pneumoniae NOT DETECTED NOT DETECTED Final   Streptococcus pyogenes NOT DETECTED NOT DETECTED Final   A.calcoaceticus-baumannii NOT DETECTED NOT DETECTED Final   Bacteroides fragilis NOT DETECTED NOT DETECTED Final   Enterobacterales DETECTED (A) NOT DETECTED Final    Comment: CRITICAL RESULT CALLED TO, READ BACK BY AND VERIFIED WITHBETHA PHEBE BLUSH PHARMD, AT 1102 12/09/23 D. VANHOOK    Enterobacter cloacae complex DETECTED (A) NOT  DETECTED Final    Comment: CRITICAL RESULT CALLED TO, READ BACK BY AND VERIFIED WITH: PHEBE BLUSH PHARMD, AT 1102 12/09/23 D. VANHOOK    Escherichia coli NOT DETECTED NOT DETECTED Final   Klebsiella aerogenes DETECTED (A) NOT DETECTED Final    Comment: CRITICAL RESULT CALLED TO, READ BACK BY AND VERIFIED WITH: PHEBE BLUSH PHARMD, AT 1102 12/09/23 D. VANHOOK    Klebsiella oxytoca NOT DETECTED NOT DETECTED Final   Klebsiella pneumoniae NOT DETECTED NOT DETECTED Final   Proteus species NOT DETECTED NOT DETECTED Final   Salmonella species NOT DETECTED NOT DETECTED Final   Serratia marcescens NOT DETECTED NOT DETECTED Final   Haemophilus influenzae NOT DETECTED NOT DETECTED Final   Neisseria meningitidis NOT DETECTED NOT DETECTED Final   Pseudomonas aeruginosa NOT DETECTED NOT DETECTED Final   Stenotrophomonas maltophilia NOT DETECTED NOT DETECTED Final   Candida albicans NOT DETECTED NOT DETECTED Final   Candida auris NOT DETECTED NOT DETECTED Final   Candida glabrata NOT DETECTED NOT DETECTED Final   Candida krusei NOT DETECTED NOT DETECTED Final   Candida parapsilosis NOT DETECTED NOT DETECTED Final   Candida tropicalis NOT DETECTED  NOT DETECTED Final   Cryptococcus neoformans/gattii NOT DETECTED NOT DETECTED Final   CTX-M ESBL NOT DETECTED NOT DETECTED Final   Carbapenem resistance IMP NOT DETECTED NOT DETECTED Final   Carbapenem resistance KPC NOT DETECTED NOT DETECTED Final   Carbapenem resistance NDM NOT DETECTED NOT DETECTED Final   Carbapenem resist OXA 48 LIKE NOT DETECTED NOT DETECTED Final   Carbapenem resistance VIM NOT DETECTED NOT DETECTED Final    Comment: Performed at [facility], [address] [place], [place] [address]  Resp panel by RT-PCR (RSV, Flu A&B, Covid) Anterior Nasal Swab     Status: None   Collection Time: 12/08/23  9:34 AM   Specimen: Anterior Nasal Swab  Result Value Ref Range Status   SARS Coronavirus 2 by RT PCR NEGATIVE NEGATIVE Final    Comment:  (NOTE) SARS-CoV-2 target nucleic acids are NOT DETECTED.  The SARS-CoV-2 RNA is generally detectable in upper respiratory specimens during the acute phase of infection. The lowest concentration of SARS-CoV-2 viral copies this assay can detect is 138 copies/mL. A negative result does not preclude SARS-Cov-2 infection and should not be used as the sole basis for treatment or other patient management decisions. A negative result may occur with  improper specimen collection/handling, submission of specimen other than nasopharyngeal swab, presence of viral mutation(s) within the areas targeted by this assay, and inadequate number of viral copies(<138 copies/mL). A negative result must be combined with clinical observations, patient history, and epidemiological information. The expected result is Negative.  Fact Sheet for Patients:  BloggerCourse.com  Fact Sheet for Healthcare Providers:  SeriousBroker.it  This test is no t yet approved or cleared by the [place]  FDA and  has been authorized for detection and/or diagnosis of SARS-CoV-2 by FDA under an Emergency Use Authorization (EUA). This EUA will remain  in effect (meaning this test can be used) for the duration of the COVID-19 declaration under Section 564(b)(1) of the Act, 21 U.S.C.section 360bbb-3(b)(1), unless the authorization is terminated  or revoked sooner.       Influenza A by PCR NEGATIVE NEGATIVE Final   Influenza B by PCR NEGATIVE NEGATIVE Final    Comment: (NOTE) The Xpert Xpress SARS-CoV-2/FLU/RSV plus assay is intended as an aid in the diagnosis of influenza from Nasopharyngeal swab specimens and should not be used as a sole basis for treatment. Nasal washings and aspirates are unacceptable for Xpert Xpress SARS-CoV-2/FLU/RSV testing.  Fact Sheet for Patients: BloggerCourse.com  Fact Sheet for Healthcare  Providers: SeriousBroker.it  This test is not yet approved or cleared by the [place]  FDA and has been authorized for detection and/or diagnosis of SARS-CoV-2 by FDA under an Emergency Use Authorization (EUA). This EUA will remain in effect (meaning this test can be used) for the duration of the COVID-19 declaration under Section 564(b)(1) of the Act, 21 U.S.C. section 360bbb-3(b)(1), unless the authorization is terminated or revoked.     Resp Syncytial Virus by PCR NEGATIVE NEGATIVE Final    Comment: (NOTE) Fact Sheet for Patients: BloggerCourse.com  Fact Sheet for Healthcare Providers: SeriousBroker.it  This test is not yet approved or cleared by the [place]  FDA and has been authorized for detection and/or diagnosis of SARS-CoV-2 by FDA under an Emergency Use Authorization (EUA). This EUA will remain in effect (meaning this test can be used) for the duration of the COVID-19 declaration under Section 564(b)(1) of the Act, 21 U.S.C. section 360bbb-3(b)(1), unless the authorization is terminated or revoked.  Performed at [facility]  [facility], [address] [place], [place] [address]   Aerobic Culture w Gram Stain (superficial specimen)     Status: None   Collection Time: 12/08/23  5:55 PM   Specimen: Toe; Wound  Result Value Ref Range Status   Specimen Description   Final    TOE LEFT Performed at [facility], [address] [place], [place] [address]    Special Requests   Final    Normal Performed at [facility], [address] [place], [place] [address]    Gram Stain NO WBC SEEN NO ORGANISMS SEEN   Final   Culture   Final    RARE ENTEROBACTER CLOACAE FEW DIPHTHEROIDS(CORYNEBACTERIUM SPECIES) Standardized susceptibility testing for this organism is not available. Performed at [facility], [address], [place], [place] [address]    Report Status 12/11/2023 FINAL  Final    Organism ID, Bacteria ENTEROBACTER CLOACAE  Final      Susceptibility   Enterobacter cloacae - MIC*    CEFEPIME <=0.12 SENSITIVE Sensitive     ERTAPENEM <=0.12 SENSITIVE Sensitive     CIPROFLOXACIN  0.12 SENSITIVE Sensitive     GENTAMICIN <=1 SENSITIVE Sensitive     MEROPENEM <=0.25 SENSITIVE Sensitive     TRIMETH/SULFA <=20 SENSITIVE Sensitive     PIP/TAZO Value in next row Sensitive ug/mL     <=4 SENSITIVEThis is a modified FDA-approved test that has been validated and its performance characteristics determined by the reporting laboratory.  This laboratory is certified under the Clinical Laboratory Improvement Amendments CLIA as qualified to perform high complexity clinical laboratory testing.    * RARE ENTEROBACTER CLOACAE  Blood Culture ID Panel (Reflexed)     Status: Abnormal   Collection Time: 12/09/23  9:03 AM  Result Value Ref Range Status   Enterococcus faecalis NOT DETECTED NOT DETECTED Final   Enterococcus Faecium NOT DETECTED NOT DETECTED Final   Listeria monocytogenes NOT DETECTED NOT DETECTED Final   Staphylococcus species NOT DETECTED NOT DETECTED Final   Staphylococcus aureus (BCID) NOT DETECTED NOT DETECTED Final   Staphylococcus epidermidis NOT DETECTED NOT DETECTED Final   Staphylococcus lugdunensis NOT DETECTED NOT DETECTED Final   Streptococcus species NOT DETECTED NOT DETECTED Final   Streptococcus agalactiae NOT DETECTED NOT DETECTED Final   Streptococcus pneumoniae NOT DETECTED NOT DETECTED Final   Streptococcus pyogenes NOT DETECTED NOT DETECTED Final   A.calcoaceticus-baumannii NOT DETECTED NOT DETECTED Final   Bacteroides fragilis NOT DETECTED NOT DETECTED Final   Enterobacterales DETECTED (A) NOT DETECTED Final    Comment: Enterobacterales represent a large order of gram negative bacteria, not a single organism. Refer to culture for further identification. CRITICAL RESULT CALLED TO, READ BACK BY AND VERIFIED WITH: M ATKINS RN 12/10/2023 @ 0108 BY AB     Enterobacter cloacae complex NOT DETECTED NOT DETECTED Final   Escherichia coli NOT DETECTED NOT DETECTED Final   Klebsiella aerogenes NOT DETECTED NOT DETECTED Final   Klebsiella oxytoca NOT DETECTED NOT DETECTED Final   Klebsiella pneumoniae NOT DETECTED NOT DETECTED Final   Proteus species NOT DETECTED NOT DETECTED Final   Salmonella species NOT DETECTED NOT DETECTED Final   Serratia marcescens NOT DETECTED NOT DETECTED Final   Haemophilus influenzae NOT DETECTED NOT DETECTED Final   Neisseria meningitidis NOT DETECTED NOT DETECTED Final   Pseudomonas aeruginosa NOT DETECTED NOT DETECTED Final   Stenotrophomonas maltophilia NOT DETECTED NOT DETECTED Final   Candida albicans NOT DETECTED NOT DETECTED Final   Candida auris NOT DETECTED NOT DETECTED  Final   Candida glabrata NOT DETECTED NOT DETECTED Final   Candida krusei NOT DETECTED NOT DETECTED Final   Candida parapsilosis NOT DETECTED NOT DETECTED Final   Candida tropicalis NOT DETECTED NOT DETECTED Final   Cryptococcus neoformans/gattii NOT DETECTED NOT DETECTED Final   CTX-M ESBL NOT DETECTED NOT DETECTED Final   Carbapenem resistance IMP NOT DETECTED NOT DETECTED Final   Carbapenem resistance KPC NOT DETECTED NOT DETECTED Final   Carbapenem resistance NDM NOT DETECTED NOT DETECTED Final   Carbapenem resist OXA 48 LIKE NOT DETECTED NOT DETECTED Final   Carbapenem resistance VIM NOT DETECTED NOT DETECTED Final    Comment: Performed at [facility], [address], [place], [place] [address]  Surgical PCR screen     Status: None   Collection Time: 12/10/23 10:57 PM   Specimen: Nasal Mucosa; Nasal Swab  Result Value Ref Range Status   MRSA, PCR NEGATIVE NEGATIVE Final   Staphylococcus aureus NEGATIVE NEGATIVE Final    Comment: (NOTE) The Xpert SA Assay (FDA approved for NASAL specimens in patients 27 years of age and older), is one component of a comprehensive surveillance program. It is not intended to diagnose infection  nor to guide or monitor treatment. Performed at [facility], [address], [place], [place] [address]     Radiology Reports No results found.   SIGNED: Adriana DELENA Grams, MD, FHM. FAAFP. Jolynn Pack - [facility] Time spent - 55 min.  In seeing, evaluating and examining the patient. Reviewing medical records, labs, drawn plan of care. [facility],  Pager (please use [email or web address] to page/ text) Please use Epic Secure Chat for non-urgent communication (7AM-7PM)  If 7PM-7AM, please contact night-coverage [email or web address], 12/11/2023, 10:56 AM

## 2023-12-11 NOTE — Brief Op Note (Signed)
 BRIEF OPERATIVE NOTE  DATE OF PROCEDURE 12/11/2023  SURGEON Morene Donley Anon, DPM  ASSISTANT SURGEON None  OR STAFF Circulator: Lane Sabra RODES, RN Scrub Person: Eliane Krabbe A OR Clinical Technician: Gibson Ronell HERO   PREOPERATIVE DIAGNOSIS Chronic ulceration of left great toe Osteomyelitis left great toe  POSTOPERATIVE DIAGNOSIS Same  PROCEDURE Partial amputation of the left great toe  ANESTHESIA MAC with local  HEMOSTASIS Pneumatic ankle tourniquet set at 250 mmHg  ESTIMATED BLOOD LOSS Minimal (<5 cc)  MATERIALS USED None  INJECTABLES 0.5% Marcaine  plain  PATHOLOGY Distal aspect of left great toe to pathology Deep swab culture of left great toe microbiology for C&S  COMPLICATIONS None

## 2023-12-11 NOTE — Op Note (Signed)
 OPERATIVE NOTE  DATE OF PROCEDURE 12/11/2023  SURGEON Morene Donley Anon, DPM  ASSISTANT SURGEON None  OR STAFF Circulator: Lane Sabra RODES, RN Scrub Person: Eliane Krabbe A OR Clinical Technician: Gibson Ronell HERO   PREOPERATIVE DIAGNOSIS Chronic ulceration of left great toe Osteomyelitis left great toe  POSTOPERATIVE DIAGNOSIS Same  PROCEDURE Partial amputation of the left great toe  ANESTHESIA MAC with local  HEMOSTASIS Pneumatic ankle tourniquet set at 250 mmHg  ESTIMATED BLOOD LOSS Minimal (<5 cc)  MATERIALS USED None  INJECTABLES 0.5% Marcaine  plain  PATHOLOGY Distal aspect of left great toe to pathology Deep swab culture of left great toe microbiology for C&S  COMPLICATIONS None  INDICATIONS Patient with chronic ulceration of left great toe since October 2024, treated in private office and wound care center. Recent presentation to emergency department with chills. MRI findings consistent with early osteomyelitis. Failure of ulceration to completely resolve despite clinical improvement with conservative treatment.  DESCRIPTION OF THE PROCEDURE Preparation and Draping: Patient was placed in supine position on the operating table. A pneumatic ankle tourniquet was placed around the patient's left ankle. The left foot was anesthetized using 0.5% Marcaine  plain. The foot was scrub prepped and draped in the usual sterile manner. A timeout was performed. The foot was elevated, and the pneumatic ankle tourniquet was inflated to 250 mmHg.  Incision: Two converging semi-elliptical incisions were made circumferentially around the toe, encompassing the distal aspect of the toe and the ulceration in its entirety. Description of Procedure: Tissection was continued deep down to the level of the interphalangeal joint, which was disarticulated. The distal aspect of the toe was removed and passed from the operative field for pathological evaluation. A deep swab  culture was obtained and sent to microbiology for culture and sensitivity. Hemostasis: Achieved with pneumatic ankle tourniquet. Upon deflation, a prompt hyperemic response was noted to all remaining digits of the left foot. Closure: The skin was reapproximated using 3-0 Prolene in a simple suture and horizontal mattress suture technique. Dressings: A sterile compressive dressing was applied to the left foot.

## 2023-12-11 NOTE — Plan of Care (Signed)
  Problem: Education: Goal: Knowledge of General Education information will improve Description: Including pain rating scale, medication(s)/side effects and non-pharmacologic comfort measures Outcome: Progressing   Problem: Health Behavior/Discharge Planning: Goal: Ability to manage health-related needs will improve Outcome: Progressing   Problem: Clinical Measurements: Goal: Ability to maintain clinical measurements within normal limits will improve Outcome: Progressing Goal: Will remain free from infection Outcome: Progressing Goal: Diagnostic test results will improve Outcome: Progressing Goal: Respiratory complications will improve Outcome: Progressing Goal: Cardiovascular complication will be avoided Outcome: Progressing   Problem: Activity: Goal: Risk for activity intolerance will decrease Outcome: Progressing   Problem: Nutrition: Goal: Adequate nutrition will be maintained Outcome: Progressing   Problem: Coping: Goal: Level of anxiety will decrease Outcome: Progressing   Problem: Elimination: Goal: Will not experience complications related to bowel motility Outcome: Progressing Goal: Will not experience complications related to urinary retention Outcome: Progressing   Problem: Pain Managment: Goal: General experience of comfort will improve and/or be controlled Outcome: Progressing   Problem: Safety: Goal: Ability to remain free from injury will improve Outcome: Progressing   Problem: Skin Integrity: Goal: Risk for impaired skin integrity will decrease Outcome: Progressing   Problem: Education: Goal: Ability to describe self-care measures that may prevent or decrease complications (Diabetes Survival Skills Education) will improve Outcome: Progressing Goal: Individualized Educational Video(s) Outcome: Progressing   Problem: Coping: Goal: Ability to adjust to condition or change in health will improve Outcome: Progressing   Problem: Fluid  Volume: Goal: Ability to maintain a balanced intake and output will improve Outcome: Progressing   Problem: Health Behavior/Discharge Planning: Goal: Ability to identify and utilize available resources and services will improve Outcome: Progressing Goal: Ability to manage health-related needs will improve Outcome: Progressing   Problem: Metabolic: Goal: Ability to maintain appropriate glucose levels will improve Outcome: Progressing   Problem: Nutritional: Goal: Maintenance of adequate nutrition will improve Outcome: Progressing Goal: Progress toward achieving an optimal weight will improve Outcome: Progressing   Problem: Skin Integrity: Goal: Risk for impaired skin integrity will decrease Outcome: Progressing   Problem: Tissue Perfusion: Goal: Adequacy of tissue perfusion will improve Outcome: Progressing   Problem: Clinical Measurements: Goal: Ability to avoid or minimize complications of infection will improve Outcome: Progressing   Problem: Skin Integrity: Goal: Skin integrity will improve Outcome: Progressing

## 2023-12-11 NOTE — Transfer of Care (Signed)
 Immediate Anesthesia Transfer of Care Note  Patient: Jerry Cain  Procedure(s) Performed: AMPUTATION, TOE (Left: Toe)  Patient Location: PACU  Anesthesia Type:General  Level of Consciousness: awake  Airway & Oxygen Therapy: Patient Spontanous Breathing  Post-op Assessment: Report given to RN  Post vital signs: Reviewed and stable  Last Vitals:  Vitals Value Taken Time  BP 130/86 12/11/23 14:21  Temp    Pulse 92 12/11/23 14:24  Resp 19 12/11/23 14:24  SpO2 98 % 12/11/23 14:24  Vitals shown include unfiled device data.  Last Pain:  Vitals:   12/11/23 1225  TempSrc: Oral  PainSc: 3       Patients Stated Pain Goal: 5 (12/11/23 1225)  Complications: No notable events documented.

## 2023-12-11 NOTE — Progress Notes (Signed)
 PHARMACY - ANTICOAGULATION CONSULT NOTE  Pharmacy Consult for anticoagulation managementIndication: atrial fibrillation  Allergies  Allergen Reactions   Alpha Blocker Quinazolines     Tamsulosin  / Alfuzosin / Silodosin  caused adverse effects (weak urinary stream, increased sensations of incomplete bladder emptying, decreased urine output)   Carbapenems Other (See Comments)    Altered mental status   Cephalosporins     Unknown reaction   Nsaids Other (See Comments)    On blood thinner    Penicillins Swelling and Other (See Comments)    Did it involve swelling of the face/tongue/throat, SOB, or low BP? Unknown-possible swelling Did it involve sudden or severe rash/hives, skin peeling, or any reaction on the inside of your mouth or nose? Unknown Did you need to seek medical attention at a hospital or doctor's office? Unknown When did it last happen? Over 10 years (possible swelling)      If all above answers are NO, may proceed with cephalosporin use.     Patient Measurements: Height: 5' 11 (180.3 cm) Weight: 82.4 kg (181 lb 10.5 oz) IBW/kg (Calculated) : 75.3 HEPARIN  DW (KG): 83.6  Vital Signs: Temp: 97.9 F (36.6 C) (09/05 0505) Temp Source: Oral (09/05 0505) BP: 152/87 (09/05 0505) Pulse Rate: 78 (09/05 0505)  Labs: Recent Labs    12/08/23 0904 12/08/23 0910 12/08/23 1050 12/09/23 0345 12/10/23 0349 12/11/23 0414  HGB 12.8*  --   --  10.6*  --  10.9*  HCT 39.6  --   --  31.9*  --  33.0*  PLT 149*  --   --  131*  --  150  LABPROT  --    < >  --  31.3* 27.4* 20.2*  INR  --    < >  --  2.9* 2.4* 1.6*  CREATININE 1.05  --   --  1.09  --  0.92  TROPONINIHS 12  --  22*  --   --   --    < > = values in this interval not displayed.    Estimated Creatinine Clearance: 58 mL/min (by C-G formula based on SCr of 0.92 mg/dL).   Medical History: Past Medical History:  Diagnosis Date   Adenomatous colon polyp 2001   Anxiety    Atrial fibrillation (HCC)    takes  Coumadin  daily   BPH (benign prostatic hyperplasia)    takes Proscar  daily   CAD (coronary artery disease) 2009   a. s/p PTCA and stenting of mid-LAD and LCx in 2009 b. NST in 03/2018 showing prior infarct with no current ischemia   Carpal tunnel syndrome    right   Complication of anesthesia    hard to wake up   Diverticulosis 2007   tcs by Dr. Golda   GERD (gastroesophageal reflux disease)    Gout    Hypercholesterolemia    takes Atorvastatin  daily   Hypertension    takes Imdur ,Coreg ,and Lisinopril  daily   Hypothyroidism    takes SYnthroid  daily   Joint pain    Joint swelling    Nocturia    Numbness    right hand pointer and middle finger   Osteoarthritis    left knee   Pneumonia    many,many yrs ago   Tubular adenoma 2001    Assessment: Pharmacy consulted to dose warfarin in patient with atrial fibrillation, consult then discontinued due to need for surgery. INR bumped to 2.9 yesterday and is now down to 2.4. Warfarin was never given, patient transitioned to levofloxacin  for  bacteremia which can potentiate INR so will need to be cautious with dosing of warfarin at discharge. Discussion had with podiatry on admit regarding anticoagulation. Current plan is to hold warfarin for now, start heparin  if INR drifts below 2 prior to surgery which is planned for Friday afternoon.   INR 1.6, for surgery today.   Home dose listed as 2.5 mg on Tue+Thur+Sat and 5 mg ROW.  Goal of Therapy:  INR 2-3 Monitor platelets by anticoagulation protocol: Yes   Plan:  Continue to Hold warfarin for today for surgery F/U anticoagulation post amputation today  Cherlyn Boers, BS Pharm D, BCPS Clinical Pharmacist 12/11/2023 7:57 AM

## 2023-12-11 NOTE — Progress Notes (Signed)
 PODIATRY PROGRESS NOTE  SUBJECTIVE Jerry Cain seen in holding area prior to surgery. NPO since midnight. Wife, son and 2 daughters present.  MEDICATIONS Scheduled Meds:  [MAR Hold] budesonide  (PULMICORT ) nebulizer solution  0.5 mg Nebulization BID   [MAR Hold] carvedilol   25 mg Oral BID WC   [MAR Hold] dextromethorphan -guaiFENesin   1 tablet Oral BID   [MAR Hold] finasteride   5 mg Oral Daily   [MAR Hold] furosemide   20 mg Oral Daily   [MAR Hold] insulin  aspart  0-5 Units Subcutaneous QHS   [MAR Hold] insulin  aspart  0-9 Units Subcutaneous TID WC   [MAR Hold] isosorbide  mononitrate  60 mg Oral Daily   [MAR Hold] levothyroxine   175 mcg Oral QAC breakfast   [MAR Hold] potassium chloride   20 mEq Oral Once   [MAR Hold] rosuvastatin   10 mg Oral Daily   Continuous Infusions:  lactated ringers  10 mL/hr at 12/11/23 1241   [MAR Hold] levofloxacin  (LEVAQUIN ) IV 750 mg (12/10/23 1119)   PRN Meds:.[MAR Hold] acetaminophen  **OR** [MAR Hold] acetaminophen , [MAR Hold]  HYDROmorphone  (DILAUDID ) injection, [MAR Hold] ipratropium-albuterol , [MAR Hold] ondansetron  **OR** [MAR Hold] ondansetron  (ZOFRAN ) IV, [MAR Hold] sodium chloride   OBJECTIVE Vital signs in last 24 hours:   Temp:  [97.7 F (36.5 C)-98 F (36.7 C)] 98 F (36.7 C) (09/05 1225) Pulse Rate:  [73-82] 73 (09/05 1225) Resp:  [17-20] 18 (09/05 1225) BP: (133-158)/(83-87) 158/83 (09/05 1225) SpO2:  [95 %-98 %] 98 % (09/05 1225) Weight:  [82.4 kg] 82.4 kg (09/05 0500)  Dressing intact on L foot.  LAB/TEST RESULTS  Recent Labs    12/09/23 0345 12/11/23 0414  WBC 10.7* 7.0  HGB 10.6* 10.9*  HCT 31.9* 33.0*  PLT 131* 150  NA 137 140  K 3.1* 3.0*  CL 106 108  CO2 22 25  BUN 26* 24*  CREATININE 1.09 0.92  GLUCOSE 94 97  CALCIUM  8.4* 8.2*    Recent Results (from the past 240 hours)  Blood Culture (routine x 2)     Status: None (Preliminary result)   Collection Time: 12/08/23  9:04 AM   Specimen: BLOOD  Result  Value Ref Range Status   Specimen Description   Final    BLOOD LEFT ANTECUBITAL Performed at [facility], [address], [place], [place] [address]    Special Requests   Final    BOTTLES DRAWN AEROBIC AND ANAEROBIC Blood Culture adequate volume Performed at [facility], [address] [place], [place] [address]    Culture  Setup Time   Final    AEROBIC BOTTLE ONLY GRAM NEGATIVE RODS Gram Stain Report Called to,Read Back By and Verified With: W. EARLY AT 1350 ON 09.03.25 BY ADGER J GRAM STAIN REVIEWED-AGREE WITH RESULT DRT ANAEROBIC BOTTLE ONLY GRAM VARIABLE ROD GRAM POSITIVE COCCI GRAM VARIABLE ROD CRITICAL RESULT CALLED TO, READ BACK BY AND VERIFIED WITH: CHRISTELLA MAYER RN 12/10/2023 @ 0108 BY AB    Culture   Final    GRAM NEGATIVE RODS CULTURE REINCUBATED FOR BETTER GROWTH IDENTIFICATION AND SUSCEPTIBILITIES TO FOLLOW Performed at [facility], [address], [place], [place] [address]    Report Status PENDING  Incomplete  Blood Culture (routine x 2)     Status: None (Preliminary result)   Collection Time: 12/08/23  9:09 AM   Specimen: BLOOD RIGHT HAND  Result Value Ref Range Status   Specimen Description   Final    BLOOD RIGHT HAND Performed at [facility], [address], [place], [place] [address]    Special Requests   Final    BOTTLES DRAWN AEROBIC AND ANAEROBIC Blood Culture results may not be optimal due to an inadequate volume of blood received in culture bottles Performed at [facility], [address] [place], [place] [address]    Culture  Setup Time   Final    IN BOTH AEROBIC AND ANAEROBIC BOTTLES GRAM NEGATIVE RODS Gram Stain Report Called to,Read Back By and Verified With: CSABRA MORALE RN 12/09/23 @0649  BY J. WHITE GRAM STAIN REVIEWED-AGREE WITH RESULT DRT    Culture   Final    GRAM NEGATIVE RODS CULTURE REINCUBATED FOR BETTER GROWTH IDENTIFICATION AND SUSCEPTIBILITIES TO FOLLOW Performed at [facility], [address] [place], [place]  [address]    Report Status PENDING  Incomplete  Blood Culture ID Panel (Reflexed)     Status: Abnormal   Collection Time: 12/08/23  9:09 AM  Result Value Ref Range Status   Enterococcus faecalis NOT DETECTED NOT DETECTED Final   Enterococcus Faecium NOT DETECTED NOT DETECTED Final   Listeria monocytogenes NOT DETECTED NOT DETECTED Final   Staphylococcus species NOT DETECTED NOT DETECTED Final   Staphylococcus aureus (BCID) NOT DETECTED NOT DETECTED Final   Staphylococcus epidermidis NOT DETECTED NOT DETECTED Final   Staphylococcus lugdunensis NOT DETECTED NOT DETECTED Final   Streptococcus species NOT DETECTED NOT DETECTED Final   Streptococcus agalactiae NOT DETECTED NOT DETECTED Final   Streptococcus pneumoniae NOT DETECTED NOT DETECTED Final   Streptococcus pyogenes NOT DETECTED NOT DETECTED Final   A.calcoaceticus-baumannii NOT DETECTED NOT DETECTED Final   Bacteroides fragilis NOT DETECTED NOT DETECTED Final   Enterobacterales DETECTED (A) NOT DETECTED Final    Comment: CRITICAL RESULT CALLED TO, READ BACK BY AND VERIFIED WITHBETHA PHEBE BLUSH PHARMD, AT 1102 12/09/23 D. VANHOOK    Enterobacter cloacae complex DETECTED (A) NOT DETECTED Final    Comment: CRITICAL RESULT CALLED TO, READ BACK BY AND VERIFIED WITH: PHEBE BLUSH PHARMD, AT 1102 12/09/23 D. VANHOOK    Escherichia coli NOT DETECTED NOT DETECTED Final   Klebsiella aerogenes DETECTED (A) NOT DETECTED Final    Comment: CRITICAL RESULT CALLED TO, READ BACK BY AND VERIFIED WITH: PHEBE BLUSH PHARMD, AT 1102 12/09/23 D. VANHOOK    Klebsiella oxytoca NOT DETECTED NOT DETECTED Final   Klebsiella pneumoniae NOT DETECTED NOT DETECTED Final   Proteus species NOT DETECTED NOT DETECTED Final   Salmonella species NOT DETECTED NOT DETECTED Final   Serratia marcescens NOT DETECTED NOT DETECTED Final   Haemophilus influenzae NOT DETECTED NOT DETECTED Final   Neisseria meningitidis NOT DETECTED NOT DETECTED Final   Pseudomonas aeruginosa NOT DETECTED  NOT DETECTED Final   Stenotrophomonas maltophilia NOT DETECTED NOT DETECTED Final   Candida albicans NOT DETECTED NOT DETECTED Final   Candida auris NOT DETECTED NOT DETECTED Final   Candida glabrata NOT DETECTED NOT DETECTED Final   Candida krusei NOT DETECTED NOT DETECTED Final   Candida parapsilosis NOT DETECTED NOT DETECTED Final   Candida tropicalis NOT DETECTED NOT DETECTED Final   Cryptococcus neoformans/gattii NOT DETECTED NOT DETECTED Final   CTX-M ESBL NOT DETECTED NOT DETECTED Final   Carbapenem resistance IMP NOT DETECTED NOT DETECTED Final   Carbapenem resistance KPC NOT DETECTED NOT DETECTED Final   Carbapenem resistance NDM NOT DETECTED NOT DETECTED Final   Carbapenem resist OXA 48 LIKE NOT DETECTED NOT DETECTED Final   Carbapenem resistance VIM NOT DETECTED NOT DETECTED Final    Comment: Performed  at [facility], [address], [place], [place] [address]  Resp panel by RT-PCR (RSV, Flu A&B, Covid) Anterior Nasal Swab     Status: None   Collection Time: 12/08/23  9:34 AM   Specimen: Anterior Nasal Swab  Result Value Ref Range Status   SARS Coronavirus 2 by RT PCR NEGATIVE NEGATIVE Final    Comment: (NOTE) SARS-CoV-2 target nucleic acids are NOT DETECTED.  The SARS-CoV-2 RNA is generally detectable in upper respiratory specimens during the acute phase of infection. The lowest concentration of SARS-CoV-2 viral copies this assay can detect is 138 copies/mL. A negative result does not preclude SARS-Cov-2 infection and should not be used as the sole basis for treatment or other patient management decisions. A negative result may occur with  improper specimen collection/handling, submission of specimen other than nasopharyngeal swab, presence of viral mutation(s) within the areas targeted by this assay, and inadequate number of viral copies(<138 copies/mL). A negative result must be combined with clinical observations, patient history, and  epidemiological information. The expected result is Negative.  Fact Sheet for Patients:  BloggerCourse.com  Fact Sheet for Healthcare Providers:  SeriousBroker.it  This test is no t yet approved or cleared by the [place]  FDA and  has been authorized for detection and/or diagnosis of SARS-CoV-2 by FDA under an Emergency Use Authorization (EUA). This EUA will remain  in effect (meaning this test can be used) for the duration of the COVID-19 declaration under Section 564(b)(1) of the Act, 21 U.S.C.section 360bbb-3(b)(1), unless the authorization is terminated  or revoked sooner.       Influenza A by PCR NEGATIVE NEGATIVE Final   Influenza B by PCR NEGATIVE NEGATIVE Final    Comment: (NOTE) The Xpert Xpress SARS-CoV-2/FLU/RSV plus assay is intended as an aid in the diagnosis of influenza from Nasopharyngeal swab specimens and should not be used as a sole basis for treatment. Nasal washings and aspirates are unacceptable for Xpert Xpress SARS-CoV-2/FLU/RSV testing.  Fact Sheet for Patients: BloggerCourse.com  Fact Sheet for Healthcare Providers: SeriousBroker.it  This test is not yet approved or cleared by the [place]  FDA and has been authorized for detection and/or diagnosis of SARS-CoV-2 by FDA under an Emergency Use Authorization (EUA). This EUA will remain in effect (meaning this test can be used) for the duration of the COVID-19 declaration under Section 564(b)(1) of the Act, 21 U.S.C. section 360bbb-3(b)(1), unless the authorization is terminated or revoked.     Resp Syncytial Virus by PCR NEGATIVE NEGATIVE Final    Comment: (NOTE) Fact Sheet for Patients: BloggerCourse.com  Fact Sheet for Healthcare Providers: SeriousBroker.it  This test is not yet approved or cleared by the [place]  FDA and has been  authorized for detection and/or diagnosis of SARS-CoV-2 by FDA under an Emergency Use Authorization (EUA). This EUA will remain in effect (meaning this test can be used) for the duration of the COVID-19 declaration under Section 564(b)(1) of the Act, 21 U.S.C. section 360bbb-3(b)(1), unless the authorization is terminated or revoked.  Performed at [facility], [address], [place], [place] [address]   Aerobic Culture w Gram Stain (superficial specimen)     Status: None   Collection Time: 12/08/23  5:55 PM   Specimen: Toe; Wound  Result Value Ref Range Status   Specimen Description   Final    TOE LEFT Performed at [facility], [address] [place], [place] [address]    Special Requests   Final    Normal Performed  at [facility], [address] [place], [place] [address]    Gram Stain NO WBC SEEN NO ORGANISMS SEEN   Final   Culture   Final    RARE ENTEROBACTER CLOACAE FEW DIPHTHEROIDS(CORYNEBACTERIUM SPECIES) Standardized susceptibility testing for this organism is not available. Performed at [facility], [address] [place], [place] [address]    Report Status 12/11/2023 FINAL  Final   Organism ID, Bacteria ENTEROBACTER CLOACAE  Final      Susceptibility   Enterobacter cloacae - MIC*    CEFEPIME <=0.12 SENSITIVE Sensitive     ERTAPENEM <=0.12 SENSITIVE Sensitive     CIPROFLOXACIN  0.12 SENSITIVE Sensitive     GENTAMICIN <=1 SENSITIVE Sensitive     MEROPENEM <=0.25 SENSITIVE Sensitive     TRIMETH/SULFA <=20 SENSITIVE Sensitive     PIP/TAZO Value in next row Sensitive ug/mL     <=4 SENSITIVEThis is a modified FDA-approved test that has been validated and its performance characteristics determined by the reporting laboratory.  This laboratory is certified under the Clinical Laboratory Improvement Amendments CLIA as qualified to perform high complexity clinical laboratory testing.    * RARE ENTEROBACTER CLOACAE  Blood Culture ID Panel (Reflexed)     Status:  Abnormal   Collection Time: 12/09/23  9:03 AM  Result Value Ref Range Status   Enterococcus faecalis NOT DETECTED NOT DETECTED Final   Enterococcus Faecium NOT DETECTED NOT DETECTED Final   Listeria monocytogenes NOT DETECTED NOT DETECTED Final   Staphylococcus species NOT DETECTED NOT DETECTED Final   Staphylococcus aureus (BCID) NOT DETECTED NOT DETECTED Final   Staphylococcus epidermidis NOT DETECTED NOT DETECTED Final   Staphylococcus lugdunensis NOT DETECTED NOT DETECTED Final   Streptococcus species NOT DETECTED NOT DETECTED Final   Streptococcus agalactiae NOT DETECTED NOT DETECTED Final   Streptococcus pneumoniae NOT DETECTED NOT DETECTED Final   Streptococcus pyogenes NOT DETECTED NOT DETECTED Final   A.calcoaceticus-baumannii NOT DETECTED NOT DETECTED Final   Bacteroides fragilis NOT DETECTED NOT DETECTED Final   Enterobacterales DETECTED (A) NOT DETECTED Final    Comment: Enterobacterales represent a large order of gram negative bacteria, not a single organism. Refer to culture for further identification. CRITICAL RESULT CALLED TO, READ BACK BY AND VERIFIED WITH: M ATKINS RN 12/10/2023 @ 0108 BY AB    Enterobacter cloacae complex NOT DETECTED NOT DETECTED Final   Escherichia coli NOT DETECTED NOT DETECTED Final   Klebsiella aerogenes NOT DETECTED NOT DETECTED Final   Klebsiella oxytoca NOT DETECTED NOT DETECTED Final   Klebsiella pneumoniae NOT DETECTED NOT DETECTED Final   Proteus species NOT DETECTED NOT DETECTED Final   Salmonella species NOT DETECTED NOT DETECTED Final   Serratia marcescens NOT DETECTED NOT DETECTED Final   Haemophilus influenzae NOT DETECTED NOT DETECTED Final   Neisseria meningitidis NOT DETECTED NOT DETECTED Final   Pseudomonas aeruginosa NOT DETECTED NOT DETECTED Final   Stenotrophomonas maltophilia NOT DETECTED NOT DETECTED Final   Candida albicans NOT DETECTED NOT DETECTED Final   Candida auris NOT DETECTED NOT DETECTED Final   Candida glabrata  NOT DETECTED NOT DETECTED Final   Candida krusei NOT DETECTED NOT DETECTED Final   Candida parapsilosis NOT DETECTED NOT DETECTED Final   Candida tropicalis NOT DETECTED NOT DETECTED Final   Cryptococcus neoformans/gattii NOT DETECTED NOT DETECTED Final   CTX-M ESBL NOT DETECTED NOT DETECTED Final   Carbapenem resistance IMP NOT DETECTED NOT DETECTED Final   Carbapenem resistance KPC NOT DETECTED NOT DETECTED Final   Carbapenem  resistance NDM NOT DETECTED NOT DETECTED Final   Carbapenem resist OXA 48 LIKE NOT DETECTED NOT DETECTED Final   Carbapenem resistance VIM NOT DETECTED NOT DETECTED Final    Comment: Performed at [facility], [address], [place], [place] [address]  Surgical PCR screen     Status: None   Collection Time: 12/10/23 10:57 PM   Specimen: Nasal Mucosa; Nasal Swab  Result Value Ref Range Status   MRSA, PCR NEGATIVE NEGATIVE Final   Staphylococcus aureus NEGATIVE NEGATIVE Final    Comment: (NOTE) The Xpert SA Assay (FDA approved for NASAL specimens in patients 22 years of age and older), is one component of a comprehensive surveillance program. It is not intended to diagnose infection nor to guide or monitor treatment. Performed at [facility], [address], [place], [place] [address]      No results found.  ASSESSMENT & PLAN 1. Left great toe ulceration with possible early osteomyelitis:  Proceed with partial amputation of the L hallux as planned.  2. Atrial fibrillation on anticoagulation therapy:  Monitoring INR; currently at 1.6.  Jerry Cain Anon 12/11/2023, 12:54 PM

## 2023-12-11 NOTE — Plan of Care (Signed)
   Problem: Education: Goal: Knowledge of General Education information will improve Description Including pain rating scale, medication(s)/side effects and non-pharmacologic comfort measures Outcome: Progressing   Problem: Clinical Measurements: Goal: Ability to maintain clinical measurements within normal limits will improve Outcome: Progressing   Problem: Activity: Goal: Risk for activity intolerance will decrease Outcome: Progressing

## 2023-12-11 NOTE — Anesthesia Preprocedure Evaluation (Addendum)
 Anesthesia Evaluation  Patient identified by MRN, date of birth, ID band Patient awake    Reviewed: Allergy & Precautions, H&P , NPO status , Patient's Chart, lab work & pertinent test results, reviewed documented beta blocker date and time   Airway Mallampati: II  TM Distance: >3 FB Neck ROM: full    Dental  (+) Missing, Dental Advisory Given,    Pulmonary pneumonia, resolved   Pulmonary exam normal breath sounds clear to auscultation       Cardiovascular hypertension, + CAD and +CHF  Normal cardiovascular exam+ dysrhythmias Atrial Fibrillation  Rhythm:regular Rate:Normal     Neuro/Psych   Anxiety      Neuromuscular disease  negative psych ROS   GI/Hepatic Neg liver ROS,GERD  ,,  Endo/Other  Hypothyroidism    Renal/GU negative Renal ROS  negative genitourinary   Musculoskeletal  (+) Arthritis , Osteoarthritis,    Abdominal   Peds  Hematology negative hematology ROS (+)   Anesthesia Other Findings K 3.0  Reproductive/Obstetrics negative OB ROS                              Anesthesia Physical Anesthesia Plan  ASA: 3  Anesthesia Plan: General   Post-op Pain Management: Minimal or no pain anticipated   Induction: Intravenous  PONV Risk Score and Plan: Propofol  infusion  Airway Management Planned: Natural Airway and Nasal Cannula  Additional Equipment: None  Intra-op Plan:   Post-operative Plan:   Informed Consent: I have reviewed the patients History and Physical, chart, labs and discussed the procedure including the risks, benefits and alternatives for the proposed anesthesia with the patient or authorized representative who has indicated his/her understanding and acceptance.     Dental Advisory Given  Plan Discussed with: CRNA  Anesthesia Plan Comments:          Anesthesia Quick Evaluation

## 2023-12-11 NOTE — Anesthesia Postprocedure Evaluation (Signed)
 Anesthesia Post Note  Patient: Jerry Cain  Procedure(s) Performed: AMPUTATION, TOE (Left: Toe)  Patient location during evaluation: Phase II Anesthesia Type: General Level of consciousness: awake and alert Pain management: pain level controlled Vital Signs Assessment: post-procedure vital signs reviewed and stable Respiratory status: spontaneous breathing, nonlabored ventilation and respiratory function stable Cardiovascular status: blood pressure returned to baseline and stable Postop Assessment: no apparent nausea or vomiting Anesthetic complications: no   There were no known notable events for this encounter.   Last Vitals:  Vitals:   12/11/23 1445 12/11/23 1501  BP: 138/80 (!) 148/98  Pulse: 79 73  Resp: (!) 26 (!) 21  Temp:    SpO2: 97% 99%    Last Pain:  Vitals:   12/11/23 1445  TempSrc:   PainSc: 0-No pain                 Kinjal Neitzke L Shaquoya Cosper

## 2023-12-11 NOTE — Care Management Important Message (Signed)
 Important Message  Patient Details  Name: Jerry Cain MRN: [id number] Date of Birth: 10/25/1931   Important Message Given:  Yes - Medicare IM     Cheyla Duchemin L Morrison Mcbryar 12/11/2023, 10:30 AM

## 2023-12-12 ENCOUNTER — Encounter (HOSPITAL_COMMUNITY): Payer: Self-pay | Admitting: Podiatry

## 2023-12-12 DIAGNOSIS — M86079 Acute hematogenous osteomyelitis, unspecified ankle and foot: Secondary | ICD-10-CM

## 2023-12-12 DIAGNOSIS — L03032 Cellulitis of left toe: Secondary | ICD-10-CM | POA: Diagnosis not present

## 2023-12-12 LAB — CBC
HCT: 33.6 % — ABNORMAL LOW (ref 39.0–52.0)
Hemoglobin: 10.9 g/dL — ABNORMAL LOW (ref 13.0–17.0)
MCH: 30.5 pg (ref 26.0–34.0)
MCHC: 32.4 g/dL (ref 30.0–36.0)
MCV: 94.1 fL (ref 80.0–100.0)
Platelets: 158 K/uL (ref 150–400)
RBC: 3.57 MIL/uL — ABNORMAL LOW (ref 4.22–5.81)
RDW: 14.9 % (ref 11.5–15.5)
WBC: 8.1 K/uL (ref 4.0–10.5)
nRBC: 0 % (ref 0.0–0.2)

## 2023-12-12 LAB — BASIC METABOLIC PANEL WITH GFR
Anion gap: 8 (ref 5–15)
BUN: 20 mg/dL (ref 8–23)
CO2: 21 mmol/L — ABNORMAL LOW (ref 22–32)
Calcium: 8.1 mg/dL — ABNORMAL LOW (ref 8.9–10.3)
Chloride: 109 mmol/L (ref 98–111)
Creatinine, Ser: 0.78 mg/dL (ref 0.61–1.24)
GFR, Estimated: >60 mL/min (ref >60)
Glucose, Bld: 106 mg/dL — ABNORMAL HIGH (ref 70–99)
Potassium: 3.6 mmol/L (ref 3.5–5.1)
Sodium: 138 mmol/L (ref 135–145)

## 2023-12-12 LAB — GLUCOSE, CAPILLARY
Glucose-Capillary: 94 mg/dL (ref 70–99)
Glucose-Capillary: 70 mg/dL (ref 70–99)
Glucose-Capillary: 118 mg/dL — ABNORMAL HIGH (ref 70–99)

## 2023-12-12 LAB — PROTIME-INR
INR: 1.4 — ABNORMAL HIGH (ref 0.8–1.2)
Prothrombin Time: 18 s — ABNORMAL HIGH (ref 11.4–15.2)

## 2023-12-12 MED ORDER — LEVOFLOXACIN 750 MG PO TABS: 750.0000 mg | ORAL_TABLET | Freq: Every day | ORAL | 0 refills | Status: DC

## 2023-12-12 MED ORDER — LEVOFLOXACIN 750 MG PO TABS: 750.0000 mg | ORAL_TABLET | Freq: Every day | ORAL | 0 refills | Status: AC

## 2023-12-12 MED ORDER — LACTINEX PO CHEW: 1.0000 | CHEWABLE_TABLET | Freq: Three times a day (TID) | ORAL | 0 refills | Status: AC

## 2023-12-12 MED ORDER — HYDROCODONE-ACETAMINOPHEN 7.5-325 MG PO TABS: 1.0000 | ORAL_TABLET | Freq: Four times a day (QID) | ORAL | 0 refills | Status: AC | PRN

## 2023-12-12 MED ORDER — HYDROCODONE-ACETAMINOPHEN 7.5-325 MG PO TABS
1.0000 | ORAL_TABLET | Freq: Four times a day (QID) | ORAL | Status: DC | PRN
  Administered 2023-12-12 (×2): 1 via ORAL
  Filled 2023-12-12 (×2): qty 1

## 2023-12-12 NOTE — Plan of Care (Signed)
   Problem: Education: Goal: Knowledge of General Education information will improve Description: Including pain rating scale, medication(s)/side effects and non-pharmacologic comfort measures Outcome: Progressing   Problem: Health Behavior/Discharge Planning: Goal: Ability to manage health-related needs will improve Outcome: Progressing   Problem: Nutrition: Goal: Adequate nutrition will be maintained Outcome: Progressing

## 2023-12-12 NOTE — Progress Notes (Signed)
 Reviewed d/c instructions with pt and family, reviewed evening meds, daughter and patient aware that pt needs all evening meds including coreg , no concerns noted.

## 2023-12-12 NOTE — Progress Notes (Signed)
 Due to pt being non weight bearing from surgery, daily weight this morning was taken from bed.

## 2023-12-12 NOTE — Discharge Summary (Signed)
 Physician Discharge Summary   Patient: Jerry Cain MRN: [id number] DOB: 1931-05-09  Admit date:     12/08/2023  Discharge date: 12/12/23  Discharge Physician: Adriana DELENA Grams   PCP: Colette Torrence GRADE, MD   Recommendations at discharge:    Dry dressing applied to left foot.  Dressing to remain intact until office follow-up on Tuesday, September 9 at noon.  Remain non-weight bearing on left foot while wearing surgical shoe. -May put pressure on heel only when going to restroom. -Otherwise should remain seated with foot elevated.  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 male with medical history significant of BPH, chronic atrial fibrillation on Coumadin , GERD, hyperlipidemia, hypertension, hypothyroidism, history of asthma/COPD and and type 2 diabetes mellitus; who presented to the hospital secondary to general malaise and fever.  Symptoms have been present for the last 24 to 48 hours and worsening.  Patient reports no coughing spells, sick contacts, dysuria, hematuria, melena or hematochezia.  There has not been any focal weaknesses.   Of note, patient with diabetic foot ulcer in his left great toe present for the last couple of weeks and with changes suggesting active cellulitis and concerns for early osteomyelitis.   Fluid resuscitation given, cultures taken and IV antibiotics has been started.  Podiatrist (Dr. Blinda) consulted and will see patient in consultation.  [facility] contacted to place patient in the hospital for further evaluation and management. ---------------------------------------------------------------------------------------------------------------------  Per podiatrist Dr. Cottie:  Postop day #1 -S/p partial amputation of right great toe  12/11/2023  Status post partial amputation of left hallux (great toe), post-operative day one for osteomyelitis: - Dry dressing applied to left foot. - Dressing to remain intact until office follow-up on Tuesday, September 9 at noon. -RC hydrocodone /acetaminophen  7.5/325mg , one tablet every 6 hours as needed for pain.  - Patient to remain non-weight bearing on left foot while wearing surgical shoe. - May put pressure on heel only when going to restroom. - Otherwise should remain seated with foot elevated.   Anticoagulation: - Safe to resume Coumadin  from surgical standpoint. ----------------------------------------------------------------------------------------------------------------------------- Sepsis due to diabetic foot ulcer/cellulitis/bacteremia/osteomyelitis -Resolved sepsis physiology, hemodynamically stable -Blood cultures are growing: Enterobacter /Klebsiella - pansensitive -Repeat blood cultures obtained 12/10/2023 -no growth to date   POA-met SIRS, early sepsis criteria-source of infection diabetic toe ulcer with cellulitis - Improved sepsis physiology -S/p treatment with IV fluids, IV antibiotics - MRI of foot: No with mild edema in the tuft of the distal phalanx of the great toe as above. This is likely reactive. Mild early osteomyelitis cannot be entirely exclude - Podiatry service has been contacted - Currently on IV vancomycin  >>> per culture switching to Levaquin    Podiatrist (Dr. Blinda)  -Tolerated procedure well, stable -Instruction given-cleared for discharge      H/o prediabetes Per patient not diabetic, Prediabetic -not on any medications at home _ A1c: 5.8 ---6.0  - Continue carb modified diabetic diet     Chronic atrial fibrillation - Continue beta-blocker for rate control - Coumadin  was on hold, mildly reversed for surgical invention, amputation -INR 2.2>> 2.9, 2.4>>> 1.6, 1.4 - Resume Coumadin  with INR goal of 2-3   Hypertension -  Hypotensive-continue to hold BP meds for now   Hyperlipidemia - Continue statin.   Hypothyroidism- Continue Synthroid  - TSH: 1.8  History of asthma/COPD -Stable on room air, no signs of exacerbation     History of BPH- Continue treatment with Proscar .           --------------------------------------------------------------------------------------------------------------------------- Nutritional status:  I agree with the assessment and plan as outlined      Consultants: Podiatrist Dr. Blinda Procedures performed: Status post partial amputation of left hallux (great toe), post-operative day one for osteomyelitis:   Disposition: Home Diet recommendation:  Discharge Diet Orders (From admission, onward)     Start     Ordered   12/12/23 0000  Diet - low sodium heart healthy        12/12/23 1219           Cardiac and Carb modified diet DISCHARGE MEDICATION: Allergies as of 12/12/2023       Reactions   Alpha Blocker Quinazolines    Tamsulosin  / Alfuzosin / Silodosin  caused adverse effects (weak urinary stream, increased sensations of incomplete bladder emptying, decreased urine output)   Carbapenems Other (See Comments)   Altered mental status   Cephalosporins    Unknown reaction   Nsaids Other (See Comments)   On blood thinner    Penicillins Swelling, Other (See Comments)   Did it involve swelling of the face/tongue/throat, SOB, or low BP? Unknown-possible swelling Did it involve sudden or severe rash/hives, skin peeling, or any reaction on the inside of your mouth or nose? Unknown Did you need to seek medical attention at a hospital or doctor's office? Unknown When did it last happen? Over 10 years (possible swelling)      If all above answers are NO, may proceed with cephalosporin use.        Medication List     PAUSE taking these medications    lisinopril  20 MG tablet Wait to take this until: December 17, 2023 Commonly known as: ZESTRIL  Take 1  tablet (20 mg total) by mouth daily.       TAKE these medications    albuterol  (2.5 MG/3ML) 0.083% nebulizer solution Commonly known as: PROVENTIL  USE 1 VIAL IN NEBULIZER EVERY 6 HOURS AS NEEDED.   Calcium  Carbonate-Vitamin D  600-200 MG-UNIT Tabs Take 1 tablet by mouth 2 (two) times daily.   carvedilol  25 MG tablet Commonly known as: COREG  Take 1 tablet (25 mg total) by mouth 2 (two) times daily with a meal.   finasteride  5 MG tablet Commonly known as: PROSCAR  Take 1 tablet (5 mg total) by mouth daily.   furosemide  20 MG tablet Commonly known as: LASIX  Take 1 tablet (20 mg total) by mouth daily.   Glucosamine 750 MG Tabs Take 750 mg by mouth 2 (two) times daily.   HYDROcodone -acetaminophen  7.5-325 MG tablet Commonly known as: NORCO Take 1 tablet by mouth every 6 (six) hours as needed for up to 5 days for moderate pain (pain score 4-6) or severe pain (pain score 7-10).   isosorbide  mononitrate 60 MG 24 hr tablet Commonly known as: IMDUR  Take 1 tablet (60 mg total) by mouth daily.   lactobacillus acidophilus & bulgar chewable tablet Chew 1 tablet by mouth 3 (three) times daily with meals for 10 days.   levofloxacin  750 MG tablet Commonly known as: Levaquin  Take 1 tablet (750 mg total) by mouth daily for 10 days. Start taking on: December 13, 2023   levothyroxine  175 MCG tablet Commonly known as: Synthroid  Take 1 tablet (175 mcg total) by mouth daily before breakfast.   multivitamin capsule Take 1 capsule by mouth every morning.  Nebulizer Misc 1 Units by Does not apply route daily.   nitroGLYCERIN  0.4 MG SL tablet Commonly known as: NITROSTAT  Place 0.4 mg under the tongue every 5 (five) minutes x 3 doses as needed for chest pain (If no relief after 2nd dose, proceed to ED or call 911).   rosuvastatin  10 MG tablet Commonly known as: CRESTOR  Take 1 tablet (10 mg total) by mouth daily.   warfarin 5 MG tablet Commonly known as: COUMADIN  Take as directed. If  you are unsure how to take this medication, talk to your nurse or doctor. Original instructions: TAKE (1/2) TO 1 TABLET BY MOUTH ONCE DAILY OR AS DIRECTED BY COUMADIN  CLINIC. What changed: See the new instructions.               Discharge Care Instructions  (From admission, onward)           Start     Ordered   12/12/23 0000  Discharge wound care:       Comments: Per specific instruction from wound care per surgery team   12/12/23 1219            Follow-up Information     Blinda Katz, DPM Follow up on 12/15/2023.   Specialty: Podiatry Why: at 12:00 [place]) Contact information: [address] [place] [address] [phone]                Discharge Exam: Filed Weights   12/10/23 0500 12/11/23 0500 12/12/23 0602  Weight: 82 kg 82.4 kg 83.1 kg        General:  AAO x 3,  cooperative, no distress;   HEENT:  Normocephalic, PERRL, otherwise with in Normal limits   Neuro:  CNII-XII intact. , normal motor and sensation, reflexes intact   Lungs:   Clear to auscultation BL, Respirations unlabored,  No wheezes / crackles  Cardio:    S1/S2, RRR, No murmure, No Rubs or Gallops   Abdomen:  Soft, non-tender, bowel sounds active all four quadrants, no guarding or peritoneal signs.  Muscular  skeletal:  Partially amputated right great toe, dressing in place Limited exam -global generalized weaknesses - in bed, able to move all 4 extremities,   2+ pulses,  symmetric, No pitting edema  Skin:  Dry, warm to touch, right great toe/foot surgical wound dressing in place  Wounds: Right great toe surgical wound, dressing in place          Condition at discharge: good  The results of significant diagnostics from this hospitalization (including imaging, microbiology, ancillary and laboratory) are listed below for reference.   Imaging Studies: DG Foot Complete Left Result Date: 12/11/2023 CLINICAL DATA:  Amputation great toe EXAM: LEFT FOOT - COMPLETE 3+  VIEW COMPARISON:  12/08/2023 MRI FINDINGS: Interval amputation of the distal phalanx great toe. Central concavity of the distal articular surface of the proximal phalanx as on preoperative radiographs. Bandaging along the amputation site observed. Second through fifth hammertoe/claw toe deformities. Chronic corticated erosion along the medial margin of the head of the first metatarsal, cannot exclude gout arthropathy. Mild atheromatous vascular calcifications. Dorsal subcutaneous swelling along the distal forefoot. IMPRESSION: 1. Interval amputation of the distal phalanx of the great toe. 2. Chronic corticated erosion along the medial margin of the head of the first metatarsal, cannot exclude gout arthropathy. 3. Second through fifth hammertoe/claw toe deformities. 4. Dorsal subcutaneous swelling along the distal forefoot. Electronically Signed   By: Ryan Salvage M.D.   On: 12/11/2023 15:16  MR FOOT LEFT W WO CONTRAST Result Date: 12/08/2023 MR FOOT WITHOUT THEN WITH IV CONTRAST COMPARISON: None. CLINICAL HISTORY: Soft tissue infection suspected. Wound left great toe. PULSE SEQUENCES: Ax T1, Ax T2 FS, Sag T1, Sag T2 FS, Cor STIR, Cor T1 with and without IV contrast FINDINGS: There is a moderate-sized wound identified in the plantar aspect of the great toe. There is no full-thickness tissue loss. There is mild edema in the tuft of the distal phalanx of the great toe which is nonspecific. This could be related to reactive edema or mild early osteomyelitis in the appropriate clinical setting. No destructive changes are present. 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 There is significant degenerative change in the second and third interphalangeal joints. Musculotendinous structures demonstrate no significant tenosynovitis or tendinosis. There is mild fatty atrophy of the intrinsic muscles foot. No focal drainable collection is  present. IMPRESSION: No with mild edema in the tuft of the distal phalanx of the great toe as above. This is likely reactive. Mild early osteomyelitis cannot be entirely excluded. No destructive changes. No drainable collection. Follow-up suggested after conservative management to ensure no significant progression. Moderate degenerative changes in the interphalangeal joints of the first, second and third toes. Electronically signed by: Norleen Satchel MD 12/08/2023 12:46 PM EDT RP Workstation: [id number]   DG Toe Great Left Result Date: 12/08/2023 CLINICAL DATA:  Left great toe has open sore on the and, wound, infection EXAM: LEFT GREAT TOE COMPARISON:  March 20, 2013 foot radiographs FINDINGS: Soft tissue ulceration and swelling at the distal aspect of the first toe. No acute osseous fractures identified. No lytic or blastic osseous lesions. No osseous erosions identified. Degenerative changes of interphalangeal and first MTP joints. IMPRESSION: First toe soft tissue ulceration and swelling with no radiographic evidence for acute osteomyelitis. Electronically Signed   By: Michaeline Blanch M.D.   On: 12/08/2023 11:07   DG Chest Port 1 View Result Date: 12/08/2023 EXAM: 1 VIEW XRAY OF THE CHEST 12/08/2023 08:56:00 AM COMPARISON: AP radiograph of chest dated 01/17/2021. CLINICAL HISTORY: Questionable sepsis - evaluate for abnormality. Pt c/o flue like symptoms, brb EMS, O2 sat at 87 in field, placed on 2L O2. Temp checked at bedside at arrival, 100.7 F. Pt denies N/V/D, A\T\Ox4. Hx of afib, CAD, hypertension, pneumonia. Non smoker. FINDINGS: LUNGS AND PLEURA: Mildly prominent reticular opacities present within the periphery of the lungs bilaterally suggesting interstitial edema. No focal pulmonary opacity. No pleural effusion. No pneumothorax. HEART AND MEDIASTINUM: No acute abnormality of the cardiac and mediastinal silhouettes. BONES AND SOFT TISSUES: No acute osseous abnormality. IMPRESSION: 1. Mildly prominent  reticular opacities within the periphery of the lungs bilaterally, suggesting interstitial edema. Electronically signed by: Evalene Coho MD 12/08/2023 09:04 AM EDT RP Workstation: [id number]    Microbiology: Results for orders placed or performed during the hospital encounter of 12/08/23  Blood Culture (routine x 2)     Status: Abnormal (Preliminary result)   Collection Time: 12/08/23  9:04 AM   Specimen: BLOOD  Result Value Ref Range Status   Specimen Description   Final    BLOOD LEFT ANTECUBITAL Performed at [facility], [address], [place], [place] [address]    Special Requests   Final    BOTTLES DRAWN AEROBIC AND ANAEROBIC Blood Culture adequate volume Performed at [facility], [address], [place], [place] [address]    Culture  Setup Time   Final  AEROBIC BOTTLE ONLY GRAM NEGATIVE RODS Gram Stain Report Called to,Read Back By and Verified With: W. EARLY AT 1350 ON 09.03.25 BY ADGER J GRAM STAIN REVIEWED-AGREE WITH RESULT DRT ANAEROBIC BOTTLE ONLY GRAM VARIABLE ROD GRAM POSITIVE COCCI GRAM VARIABLE ROD CRITICAL RESULT CALLED TO, READ BACK BY AND VERIFIED WITH: CHRISTELLA MAYER RN 12/10/2023 @ 0108 BY AB    Culture (A)  Final    ENTEROBACTER CLOACAE SUSCEPTIBILITIES PERFORMED ON PREVIOUS CULTURE WITHIN THE LAST 5 DAYS. CULTURE REINCUBATED FOR BETTER GROWTH Performed at [facility], [address], [place], [place] [address]    Report Status PENDING  Incomplete  Blood Culture (routine x 2)     Status: Abnormal (Preliminary result)   Collection Time: 12/08/23  9:09 AM   Specimen: BLOOD RIGHT HAND  Result Value Ref Range Status   Specimen Description   Final    BLOOD RIGHT HAND Performed at [facility], [address], [place], [place] [address]    Special Requests   Final    BOTTLES DRAWN AEROBIC AND ANAEROBIC Blood Culture results may not be optimal due to an inadequate volume of blood received in culture bottles Performed at [facility] [address] [place], [place] [address]    Culture  Setup Time   Final    IN BOTH AEROBIC AND ANAEROBIC BOTTLES GRAM NEGATIVE RODS Gram Stain Report Called to,Read Back By and Verified With: CSABRA MORALE RN 12/09/23 @0649  BY J. WHITE GRAM STAIN REVIEWED-AGREE WITH RESULT DRT    Culture (A)  Final    ENTEROBACTER CLOACAE CULTURE REINCUBATED FOR BETTER GROWTH Performed at [facility], [address] [place], [place] [address]    Report Status PENDING  Incomplete   Organism ID, Bacteria ENTEROBACTER CLOACAE  Final      Susceptibility   Enterobacter cloacae - MIC*    CEFEPIME <=0.12 SENSITIVE Sensitive     ERTAPENEM <=0.12 SENSITIVE Sensitive     CIPROFLOXACIN  <=0.06 SENSITIVE Sensitive     GENTAMICIN <=1 SENSITIVE Sensitive     MEROPENEM <=0.25 SENSITIVE Sensitive     TRIMETH/SULFA <=20 SENSITIVE Sensitive     PIP/TAZO Value in next row Sensitive ug/mL     <=4 SENSITIVEThis is a modified FDA-approved test that has been validated and its performance characteristics determined by the reporting laboratory.  This laboratory is certified under the Clinical Laboratory Improvement Amendments CLIA as qualified to perform high complexity clinical laboratory testing.    * ENTEROBACTER CLOACAE  Blood Culture ID Panel (Reflexed)     Status: Abnormal   Collection Time: 12/08/23  9:09 AM  Result Value Ref Range Status   Enterococcus faecalis NOT DETECTED NOT DETECTED Final   Enterococcus Faecium NOT DETECTED NOT DETECTED Final   Listeria monocytogenes NOT DETECTED NOT DETECTED Final   Staphylococcus species NOT DETECTED NOT DETECTED Final   Staphylococcus aureus (BCID) NOT DETECTED NOT DETECTED Final   Staphylococcus epidermidis NOT DETECTED NOT DETECTED Final   Staphylococcus lugdunensis NOT DETECTED NOT DETECTED Final   Streptococcus species NOT DETECTED NOT DETECTED Final   Streptococcus agalactiae NOT DETECTED NOT DETECTED Final   Streptococcus pneumoniae NOT DETECTED NOT DETECTED Final    Streptococcus pyogenes NOT DETECTED NOT DETECTED Final   A.calcoaceticus-baumannii NOT DETECTED NOT DETECTED Final   Bacteroides fragilis NOT DETECTED NOT DETECTED Final   Enterobacterales DETECTED (A) NOT DETECTED Final    Comment: CRITICAL RESULT CALLED TO, READ BACK BY AND VERIFIED WITH: F. WILSON PHARMD, AT [phone]  12/09/23 D. VANHOOK    Enterobacter cloacae complex DETECTED (A) NOT DETECTED Final    Comment: CRITICAL RESULT CALLED TO, READ BACK BY AND VERIFIED WITH: PHEBE BLUSH PHARMD, AT 1102 12/09/23 D. VANHOOK    Escherichia coli NOT DETECTED NOT DETECTED Final   Klebsiella aerogenes DETECTED (A) NOT DETECTED Final    Comment: CRITICAL RESULT CALLED TO, READ BACK BY AND VERIFIED WITH: PHEBE BLUSH PHARMD, AT 1102 12/09/23 D. VANHOOK    Klebsiella oxytoca NOT DETECTED NOT DETECTED Final   Klebsiella pneumoniae NOT DETECTED NOT DETECTED Final   Proteus species NOT DETECTED NOT DETECTED Final   Salmonella species NOT DETECTED NOT DETECTED Final   Serratia marcescens NOT DETECTED NOT DETECTED Final   Haemophilus influenzae NOT DETECTED NOT DETECTED Final   Neisseria meningitidis NOT DETECTED NOT DETECTED Final   Pseudomonas aeruginosa NOT DETECTED NOT DETECTED Final   Stenotrophomonas maltophilia NOT DETECTED NOT DETECTED Final   Candida albicans NOT DETECTED NOT DETECTED Final   Candida auris NOT DETECTED NOT DETECTED Final   Candida glabrata NOT DETECTED NOT DETECTED Final   Candida krusei NOT DETECTED NOT DETECTED Final   Candida parapsilosis NOT DETECTED NOT DETECTED Final   Candida tropicalis NOT DETECTED NOT DETECTED Final   Cryptococcus neoformans/gattii NOT DETECTED NOT DETECTED Final   CTX-M ESBL NOT DETECTED NOT DETECTED Final   Carbapenem resistance IMP NOT DETECTED NOT DETECTED Final   Carbapenem resistance KPC NOT DETECTED NOT DETECTED Final   Carbapenem resistance NDM NOT DETECTED NOT DETECTED Final   Carbapenem resist OXA 48 LIKE NOT DETECTED NOT DETECTED Final   Carbapenem  resistance VIM NOT DETECTED NOT DETECTED Final    Comment: Performed at [facility], [address] [place], [place] [address]  Resp panel by RT-PCR (RSV, Flu A&B, Covid) Anterior Nasal Swab     Status: None   Collection Time: 12/08/23  9:34 AM   Specimen: Anterior Nasal Swab  Result Value Ref Range Status   SARS Coronavirus 2 by RT PCR NEGATIVE NEGATIVE Final    Comment: (NOTE) SARS-CoV-2 target nucleic acids are NOT DETECTED.  The SARS-CoV-2 RNA is generally detectable in upper respiratory specimens during the acute phase of infection. The lowest concentration of SARS-CoV-2 viral copies this assay can detect is 138 copies/mL. A negative result does not preclude SARS-Cov-2 infection and should not be used as the sole basis for treatment or other patient management decisions. A negative result may occur with  improper specimen collection/handling, submission of specimen other than nasopharyngeal swab, presence of viral mutation(s) within the areas targeted by this assay, and inadequate number of viral copies(<138 copies/mL). A negative result must be combined with clinical observations, patient history, and epidemiological information. The expected result is Negative.  Fact Sheet for Patients:  BloggerCourse.com  Fact Sheet for Healthcare Providers:  SeriousBroker.it  This test is no t yet approved or cleared by the [place]  FDA and  has been authorized for detection and/or diagnosis of SARS-CoV-2 by FDA under an Emergency Use Authorization (EUA). This EUA will remain  in effect (meaning this test can be used) for the duration of the COVID-19 declaration under Section 564(b)(1) of the Act, 21 U.S.C.section 360bbb-3(b)(1), unless the authorization is terminated  or revoked sooner.       Influenza A by PCR NEGATIVE NEGATIVE Final   Influenza B by PCR NEGATIVE NEGATIVE Final    Comment: (NOTE) The Xpert Xpress  SARS-CoV-2/FLU/RSV plus assay is intended as an aid in the diagnosis  of influenza from Nasopharyngeal swab specimens and should not be used as a sole basis for treatment. Nasal washings and aspirates are unacceptable for Xpert Xpress SARS-CoV-2/FLU/RSV testing.  Fact Sheet for Patients: BloggerCourse.com  Fact Sheet for Healthcare Providers: SeriousBroker.it  This test is not yet approved or cleared by the [place]  FDA and has been authorized for detection and/or diagnosis of SARS-CoV-2 by FDA under an Emergency Use Authorization (EUA). This EUA will remain in effect (meaning this test can be used) for the duration of the COVID-19 declaration under Section 564(b)(1) of the Act, 21 U.S.C. section 360bbb-3(b)(1), unless the authorization is terminated or revoked.     Resp Syncytial Virus by PCR NEGATIVE NEGATIVE Final    Comment: (NOTE) Fact Sheet for Patients: BloggerCourse.com  Fact Sheet for Healthcare Providers: SeriousBroker.it  This test is not yet approved or cleared by the [place]  FDA and has been authorized for detection and/or diagnosis of SARS-CoV-2 by FDA under an Emergency Use Authorization (EUA). This EUA will remain in effect (meaning this test can be used) for the duration of the COVID-19 declaration under Section 564(b)(1) of the Act, 21 U.S.C. section 360bbb-3(b)(1), unless the authorization is terminated or revoked.  Performed at [facility], [address], [place], [place] [address]   Aerobic Culture w Gram Stain (superficial specimen)     Status: None   Collection Time: 12/08/23  5:55 PM   Specimen: Toe; Wound  Result Value Ref Range Status   Specimen Description   Final    TOE LEFT Performed at [facility], [address] [place], [place] [address]    Special Requests   Final    Normal Performed at [facility], [address]  [place], [place] [address]    Gram Stain NO WBC SEEN NO ORGANISMS SEEN   Final   Culture   Final    RARE ENTEROBACTER CLOACAE FEW DIPHTHEROIDS(CORYNEBACTERIUM SPECIES) Standardized susceptibility testing for this organism is not available. Performed at [facility], [address] [place], [place] [address]    Report Status 12/11/2023 FINAL  Final   Organism ID, Bacteria ENTEROBACTER CLOACAE  Final      Susceptibility   Enterobacter cloacae - MIC*    CEFEPIME <=0.12 SENSITIVE Sensitive     ERTAPENEM <=0.12 SENSITIVE Sensitive     CIPROFLOXACIN  0.12 SENSITIVE Sensitive     GENTAMICIN <=1 SENSITIVE Sensitive     MEROPENEM <=0.25 SENSITIVE Sensitive     TRIMETH/SULFA <=20 SENSITIVE Sensitive     PIP/TAZO Value in next row Sensitive ug/mL     <=4 SENSITIVEThis is a modified FDA-approved test that has been validated and its performance characteristics determined by the reporting laboratory.  This laboratory is certified under the Clinical Laboratory Improvement Amendments CLIA as qualified to perform high complexity clinical laboratory testing.    * RARE ENTEROBACTER CLOACAE  Blood Culture ID Panel (Reflexed)     Status: Abnormal   Collection Time: 12/09/23  9:03 AM  Result Value Ref Range Status   Enterococcus faecalis NOT DETECTED NOT DETECTED Final   Enterococcus Faecium NOT DETECTED NOT DETECTED Final   Listeria monocytogenes NOT DETECTED NOT DETECTED Final   Staphylococcus species NOT DETECTED NOT DETECTED Final   Staphylococcus aureus (BCID) NOT DETECTED NOT DETECTED Final   Staphylococcus epidermidis NOT DETECTED NOT DETECTED Final   Staphylococcus lugdunensis NOT DETECTED NOT DETECTED Final   Streptococcus species NOT DETECTED NOT DETECTED Final   Streptococcus agalactiae NOT DETECTED NOT DETECTED Final  Streptococcus pneumoniae NOT DETECTED NOT DETECTED Final   Streptococcus pyogenes NOT DETECTED NOT DETECTED Final   A.calcoaceticus-baumannii NOT DETECTED NOT DETECTED  Final   Bacteroides fragilis NOT DETECTED NOT DETECTED Final   Enterobacterales DETECTED (A) NOT DETECTED Final    Comment: Enterobacterales represent a large order of gram negative bacteria, not a single organism. Refer to culture for further identification. CRITICAL RESULT CALLED TO, READ BACK BY AND VERIFIED WITH: M ATKINS RN 12/10/2023 @ 0108 BY AB    Enterobacter cloacae complex NOT DETECTED NOT DETECTED Final   Escherichia coli NOT DETECTED NOT DETECTED Final   Klebsiella aerogenes NOT DETECTED NOT DETECTED Final   Klebsiella oxytoca NOT DETECTED NOT DETECTED Final   Klebsiella pneumoniae NOT DETECTED NOT DETECTED Final   Proteus species NOT DETECTED NOT DETECTED Final   Salmonella species NOT DETECTED NOT DETECTED Final   Serratia marcescens NOT DETECTED NOT DETECTED Final   Haemophilus influenzae NOT DETECTED NOT DETECTED Final   Neisseria meningitidis NOT DETECTED NOT DETECTED Final   Pseudomonas aeruginosa NOT DETECTED NOT DETECTED Final   Stenotrophomonas maltophilia NOT DETECTED NOT DETECTED Final   Candida albicans NOT DETECTED NOT DETECTED Final   Candida auris NOT DETECTED NOT DETECTED Final   Candida glabrata NOT DETECTED NOT DETECTED Final   Candida krusei NOT DETECTED NOT DETECTED Final   Candida parapsilosis NOT DETECTED NOT DETECTED Final   Candida tropicalis NOT DETECTED NOT DETECTED Final   Cryptococcus neoformans/gattii NOT DETECTED NOT DETECTED Final   CTX-M ESBL NOT DETECTED NOT DETECTED Final   Carbapenem resistance IMP NOT DETECTED NOT DETECTED Final   Carbapenem resistance KPC NOT DETECTED NOT DETECTED Final   Carbapenem resistance NDM NOT DETECTED NOT DETECTED Final   Carbapenem resist OXA 48 LIKE NOT DETECTED NOT DETECTED Final   Carbapenem resistance VIM NOT DETECTED NOT DETECTED Final    Comment: Performed at [facility], [address] [place], [place] [address]  Surgical PCR screen     Status: None   Collection Time: 12/10/23 10:57 PM    Specimen: Nasal Mucosa; Nasal Swab  Result Value Ref Range Status   MRSA, PCR NEGATIVE NEGATIVE Final   Staphylococcus aureus NEGATIVE NEGATIVE Final    Comment: (NOTE) The Xpert SA Assay (FDA approved for NASAL specimens in patients 63 years of age and older), is one component of a comprehensive surveillance program. It is not intended to diagnose infection nor to guide or monitor treatment. Performed at [facility], [address], [place], [place] [address]   Aerobic/Anaerobic Culture w Gram Stain (surgical/deep wound)     Status: None (Preliminary result)   Collection Time: 12/11/23  1:51 PM   Specimen: Path fluid; Body Fluid  Result Value Ref Range Status   Specimen Description   Final    FLUID Performed at [facility], [address] [place], [place] [address]    Special Requests   Final    NONE Performed at [facility], [address] [place], [place] [address]    Gram Stain NO WBC SEEN NO ORGANISMS SEEN   Final   Culture   Final    CULTURE REINCUBATED FOR BETTER GROWTH Performed at [facility], [address] [place], [place] [address]    Report Status PENDING  Incomplete    Labs: CBC: Recent Labs  Lab 12/08/23 0904 12/09/23 0345 12/11/23 0414 12/12/23 0400  WBC 8.8 10.7* 7.0 8.1  NEUTROABS 8.1* 9.4*  --   --   HGB  12.8* 10.6* 10.9* 10.9*  HCT 39.6 31.9* 33.0* 33.6*  MCV 94.7 93.5 94.3 94.1  PLT 149* 131* 150 158   Basic Metabolic Panel: Recent Labs  Lab 12/08/23 0904 12/08/23 1133 12/09/23 0345 12/11/23 0414 12/12/23 0400  NA 137  --  137 140 138  K 4.3  --  3.1* 3.0* 3.6  CL 103  --  106 108 109  CO2 23  --  22 25 21*  GLUCOSE 110*  --  94 97 106*  BUN 21  --  26* 24* 20  CREATININE 1.05  --  1.09 0.92 0.78  CALCIUM  9.1  --  8.4* 8.2* 8.1*  MG 1.9 1.7  --   --   --   PHOS  --  2.1*  --   --   --    Liver Function Tests: Recent Labs  Lab 12/08/23 0904  AST 25  ALT 26  ALKPHOS 60  BILITOT 2.2*  PROT 7.1  ALBUMIN 3.8    CBG: Recent Labs  Lab 12/11/23 1454 12/11/23 1623 12/11/23 1957 12/12/23 0724 12/12/23 1132  GLUCAP 74 103* 129* 94 70    Discharge time spent: greater than 30 minutes.  Signed: Adriana DELENA Grams, MD [facility] 12/12/2023

## 2023-12-12 NOTE — TOC Progression Note (Signed)
 Transition of Care [facility]) - Progression Note    Patient Details  Name: Jerry Cain MRN: [id number] Date of Birth: Nov 03, 1931  [facility] CM/SW Contact  Lorraine LILLETTE Fenton, LCSW Phone Number: 12/12/2023, 2:53 PM  Clinical Narrative:    CSW met with pt at bedside and family members including spouse. Shared that MD ordered home RN to assist with care. Asked about choice. Family has had [facility] involved and provided nurse name.  CSW shared nurse name with [facility] contact as family pleased with care. [facility] selected by pt/family, CSW adding info to AVS.  Advised RN/MD by [facility] that ICM all set.  No further ICM needs.     Expected Discharge Plan: Home/Self Care Barriers to Discharge: No Barriers Identified               Expected Discharge Plan and Services         Expected Discharge Date: 12/12/23                                     Social Drivers of Health (SDOH) Interventions SDOH Screenings   Food Insecurity: No Food Insecurity (12/08/2023)  Housing: Low Risk  (12/08/2023)  Transportation Needs: No Transportation Needs (12/08/2023)  Utilities: Not At Risk (12/08/2023)  Depression (PHQ2-9): Low Risk  (05/15/2023)  Social Connections: Socially Integrated (12/08/2023)  Tobacco Use: Low Risk  (12/08/2023)    Readmission Risk Interventions     No data to display

## 2023-12-12 NOTE — Progress Notes (Signed)
 PODIATRY PROGRESS NOTE  SUBJECTIVE [name] is post-operative day one following partial amputation of his left great toe for management of osteomyelitis. Patient reports experiencing some pain this morning, for which he received Dilaudid . His wife, son, and one of his daughters are present at bedside and report that the patient has been a little confused or talking out of his head since receiving the medication. Patient denies any nausea, vomiting, fever, or chills.  MEDICATIONS Scheduled Meds:  budesonide  (PULMICORT ) nebulizer solution  0.5 mg Nebulization BID   carvedilol   25 mg Oral BID WC   dextromethorphan -guaiFENesin   1 tablet Oral BID   finasteride   5 mg Oral Daily   furosemide   20 mg Oral Daily   insulin  aspart  0-5 Units Subcutaneous QHS   insulin  aspart  0-9 Units Subcutaneous TID WC   isosorbide  mononitrate  60 mg Oral Daily   levothyroxine   175 mcg Oral QAC breakfast   rosuvastatin   10 mg Oral Daily   Continuous Infusions:  levofloxacin  (LEVAQUIN ) IV 750 mg (12/10/23 1119)   PRN Meds:.acetaminophen  **OR** acetaminophen , HYDROcodone -acetaminophen , ipratropium-albuterol , ondansetron  **OR** ondansetron  (ZOFRAN ) IV, sodium chloride   OBJECTIVE Vital signs in last 24 hours:   Temp:  [97.6 F (36.4 C)-98 F (36.7 C)] 98 F (36.7 C) (09/06 0602) Pulse Rate:  [70-99] 99 (09/06 0602) Resp:  [18-26] 20 (09/06 0602) BP: (130-158)/(78-98) 130/78 (09/06 0602) SpO2:  [93 %-99 %] 95 % (09/06 0811) Weight:  [83.1 kg] 83.1 kg (09/06 0602)  LEFT FOOT: - Surgical dressing intact.  - No visible strikethrough noted. - Upon removal of dressing, incision is well approximated with sutures intact. - No erythema noted. - Calves are soft and nontender  LAB/TEST RESULTS  Recent Labs    12/11/23 0414 12/12/23 0400  WBC 7.0 8.1  HGB 10.9* 10.9*  HCT 33.0* 33.6*  PLT 150 158  NA 140 138  K 3.0* 3.6  CL 108 109  CO2 25 21*  BUN 24* 20  CREATININE 0.92 0.78  GLUCOSE  97 106*  CALCIUM  8.2* 8.1*    Recent Results (from the past 240 hours)  Blood Culture (routine x 2)     Status: Abnormal (Preliminary result)   Collection Time: 12/08/23  9:04 AM   Specimen: BLOOD  Result Value Ref Range Status   Specimen Description   Final    BLOOD LEFT ANTECUBITAL Performed at [facility], [address] [place], [place] [address]    Special Requests   Final    BOTTLES DRAWN AEROBIC AND ANAEROBIC Blood Culture adequate volume Performed at [facility], [address], [place], [place] [address]    Culture  Setup Time   Final    AEROBIC BOTTLE ONLY GRAM NEGATIVE RODS Gram Stain Report Called to,Read Back By and Verified With: W. EARLY AT 1350 ON 09.03.25 BY ADGER J GRAM STAIN REVIEWED-AGREE WITH RESULT DRT ANAEROBIC BOTTLE ONLY GRAM VARIABLE ROD GRAM POSITIVE COCCI GRAM VARIABLE ROD CRITICAL RESULT CALLED TO, READ BACK BY AND VERIFIED WITH: CHRISTELLA MAYER RN 12/10/2023 @ 0108 BY AB    Culture (A)  Final    ENTEROBACTER CLOACAE SUSCEPTIBILITIES PERFORMED ON PREVIOUS CULTURE WITHIN THE LAST 5 DAYS. CULTURE REINCUBATED FOR BETTER GROWTH Performed at [facility], [address], [place], [place] [address]    Report Status PENDING  Incomplete  Blood Culture (routine x 2)     Status: Abnormal (Preliminary result)   Collection Time: 12/08/23  9:09 AM   Specimen: BLOOD RIGHT HAND  Result Value Ref Range Status   Specimen Description   Final    BLOOD RIGHT HAND Performed at [facility], [address] [place], [place] [address]    Special Requests   Final    BOTTLES DRAWN AEROBIC AND ANAEROBIC Blood Culture results may not be optimal due to an inadequate volume of blood received in culture bottles Performed at [facility], [address] [place], [place] [address]    Culture  Setup Time   Final    IN BOTH AEROBIC AND ANAEROBIC BOTTLES GRAM NEGATIVE RODS Gram Stain Report Called to,Read Back By and Verified With: CSABRA MORALE RN 12/09/23 @0649  BY J.  WHITE GRAM STAIN REVIEWED-AGREE WITH RESULT DRT    Culture (A)  Final    ENTEROBACTER CLOACAE CULTURE REINCUBATED FOR BETTER GROWTH Performed at [facility], [address], [place], [place] [address]    Report Status PENDING  Incomplete   Organism ID, Bacteria ENTEROBACTER CLOACAE  Final      Susceptibility   Enterobacter cloacae - MIC*    CEFEPIME <=0.12 SENSITIVE Sensitive     ERTAPENEM <=0.12 SENSITIVE Sensitive     CIPROFLOXACIN  <=0.06 SENSITIVE Sensitive     GENTAMICIN <=1 SENSITIVE Sensitive     MEROPENEM <=0.25 SENSITIVE Sensitive     TRIMETH/SULFA <=20 SENSITIVE Sensitive     PIP/TAZO Value in next row Sensitive ug/mL     <=4 SENSITIVEThis is a modified FDA-approved test that has been validated and its performance characteristics determined by the reporting laboratory.  This laboratory is certified under the Clinical Laboratory Improvement Amendments CLIA as qualified to perform high complexity clinical laboratory testing.    * ENTEROBACTER CLOACAE  Blood Culture ID Panel (Reflexed)     Status: Abnormal   Collection Time: 12/08/23  9:09 AM  Result Value Ref Range Status   Enterococcus faecalis NOT DETECTED NOT DETECTED Final   Enterococcus Faecium NOT DETECTED NOT DETECTED Final   Listeria monocytogenes NOT DETECTED NOT DETECTED Final   Staphylococcus species NOT DETECTED NOT DETECTED Final   Staphylococcus aureus (BCID) NOT DETECTED NOT DETECTED Final   Staphylococcus epidermidis NOT DETECTED NOT DETECTED Final   Staphylococcus lugdunensis NOT DETECTED NOT DETECTED Final   Streptococcus species NOT DETECTED NOT DETECTED Final   Streptococcus agalactiae NOT DETECTED NOT DETECTED Final   Streptococcus pneumoniae NOT DETECTED NOT DETECTED Final   Streptococcus pyogenes NOT DETECTED NOT DETECTED Final   A.calcoaceticus-baumannii NOT DETECTED NOT DETECTED Final   Bacteroides fragilis NOT DETECTED NOT DETECTED Final   Enterobacterales DETECTED (A) NOT DETECTED Final     Comment: CRITICAL RESULT CALLED TO, READ BACK BY AND VERIFIED WITHBETHA PHEBE BLUSH PHARMD, AT 1102 12/09/23 D. VANHOOK    Enterobacter cloacae complex DETECTED (A) NOT DETECTED Final    Comment: CRITICAL RESULT CALLED TO, READ BACK BY AND VERIFIED WITH: PHEBE BLUSH PHARMD, AT 1102 12/09/23 D. VANHOOK    Escherichia coli NOT DETECTED NOT DETECTED Final   Klebsiella aerogenes DETECTED (A) NOT DETECTED Final    Comment: CRITICAL RESULT CALLED TO, READ BACK BY AND VERIFIED WITH: PHEBE BLUSH PHARMD, AT 1102 12/09/23 D. VANHOOK    Klebsiella oxytoca NOT DETECTED NOT DETECTED Final   Klebsiella pneumoniae NOT DETECTED NOT DETECTED Final   Proteus species NOT DETECTED NOT DETECTED Final   Salmonella species NOT DETECTED NOT DETECTED Final   Serratia marcescens NOT DETECTED NOT DETECTED Final   Haemophilus influenzae NOT DETECTED NOT DETECTED Final   Neisseria meningitidis NOT DETECTED  NOT DETECTED Final   Pseudomonas aeruginosa NOT DETECTED NOT DETECTED Final   Stenotrophomonas maltophilia NOT DETECTED NOT DETECTED Final   Candida albicans NOT DETECTED NOT DETECTED Final   Candida auris NOT DETECTED NOT DETECTED Final   Candida glabrata NOT DETECTED NOT DETECTED Final   Candida krusei NOT DETECTED NOT DETECTED Final   Candida parapsilosis NOT DETECTED NOT DETECTED Final   Candida tropicalis NOT DETECTED NOT DETECTED Final   Cryptococcus neoformans/gattii NOT DETECTED NOT DETECTED Final   CTX-M ESBL NOT DETECTED NOT DETECTED Final   Carbapenem resistance IMP NOT DETECTED NOT DETECTED Final   Carbapenem resistance KPC NOT DETECTED NOT DETECTED Final   Carbapenem resistance NDM NOT DETECTED NOT DETECTED Final   Carbapenem resist OXA 48 LIKE NOT DETECTED NOT DETECTED Final   Carbapenem resistance VIM NOT DETECTED NOT DETECTED Final    Comment: Performed at [facility], [address] [place], [place] [address]  Resp panel by RT-PCR (RSV, Flu A&B, Covid) Anterior Nasal Swab     Status: None    Collection Time: 12/08/23  9:34 AM   Specimen: Anterior Nasal Swab  Result Value Ref Range Status   SARS Coronavirus 2 by RT PCR NEGATIVE NEGATIVE Final    Comment: (NOTE) SARS-CoV-2 target nucleic acids are NOT DETECTED.  The SARS-CoV-2 RNA is generally detectable in upper respiratory specimens during the acute phase of infection. The lowest concentration of SARS-CoV-2 viral copies this assay can detect is 138 copies/mL. A negative result does not preclude SARS-Cov-2 infection and should not be used as the sole basis for treatment or other patient management decisions. A negative result may occur with  improper specimen collection/handling, submission of specimen other than nasopharyngeal swab, presence of viral mutation(s) within the areas targeted by this assay, and inadequate number of viral copies(<138 copies/mL). A negative result must be combined with clinical observations, patient history, and epidemiological information. The expected result is Negative.  Fact Sheet for Patients:  BloggerCourse.com  Fact Sheet for Healthcare Providers:  SeriousBroker.it  This test is no t yet approved or cleared by the [place]  FDA and  has been authorized for detection and/or diagnosis of SARS-CoV-2 by FDA under an Emergency Use Authorization (EUA). This EUA will remain  in effect (meaning this test can be used) for the duration of the COVID-19 declaration under Section 564(b)(1) of the Act, 21 U.S.C.section 360bbb-3(b)(1), unless the authorization is terminated  or revoked sooner.       Influenza A by PCR NEGATIVE NEGATIVE Final   Influenza B by PCR NEGATIVE NEGATIVE Final    Comment: (NOTE) The Xpert Xpress SARS-CoV-2/FLU/RSV plus assay is intended as an aid in the diagnosis of influenza from Nasopharyngeal swab specimens and should not be used as a sole basis for treatment. Nasal washings and aspirates are unacceptable for  Xpert Xpress SARS-CoV-2/FLU/RSV testing.  Fact Sheet for Patients: BloggerCourse.com  Fact Sheet for Healthcare Providers: SeriousBroker.it  This test is not yet approved or cleared by the [place]  FDA and has been authorized for detection and/or diagnosis of SARS-CoV-2 by FDA under an Emergency Use Authorization (EUA). This EUA will remain in effect (meaning this test can be used) for the duration of the COVID-19 declaration under Section 564(b)(1) of the Act, 21 U.S.C. section 360bbb-3(b)(1), unless the authorization is terminated or revoked.     Resp Syncytial Virus by PCR NEGATIVE NEGATIVE Final    Comment: (NOTE) Fact Sheet for Patients: BloggerCourse.com  Fact Sheet for Healthcare Providers: SeriousBroker.it  This test is not yet approved or cleared by the [place]  FDA and has been authorized for detection and/or diagnosis of SARS-CoV-2 by FDA under an Emergency Use Authorization (EUA). This EUA will remain in effect (meaning this test can be used) for the duration of the COVID-19 declaration under Section 564(b)(1) of the Act, 21 U.S.C. section 360bbb-3(b)(1), unless the authorization is terminated or revoked.  Performed at [facility], [address] [place], [place] [address]   Aerobic Culture w Gram Stain (superficial specimen)     Status: None   Collection Time: 12/08/23  5:55 PM   Specimen: Toe; Wound  Result Value Ref Range Status   Specimen Description   Final    TOE LEFT Performed at [facility], [address], [place], [place] [address]    Special Requests   Final    Normal Performed at [facility], [address], [place], [place] [address]    Gram Stain NO WBC SEEN NO ORGANISMS SEEN   Final   Culture   Final    RARE ENTEROBACTER CLOACAE FEW DIPHTHEROIDS(CORYNEBACTERIUM SPECIES) Standardized susceptibility testing for this organism is not  available. Performed at [facility], [address], [place], [place] [address]    Report Status 12/11/2023 FINAL  Final   Organism ID, Bacteria ENTEROBACTER CLOACAE  Final      Susceptibility   Enterobacter cloacae - MIC*    CEFEPIME <=0.12 SENSITIVE Sensitive     ERTAPENEM <=0.12 SENSITIVE Sensitive     CIPROFLOXACIN  0.12 SENSITIVE Sensitive     GENTAMICIN <=1 SENSITIVE Sensitive     MEROPENEM <=0.25 SENSITIVE Sensitive     TRIMETH/SULFA <=20 SENSITIVE Sensitive     PIP/TAZO Value in next row Sensitive ug/mL     <=4 SENSITIVEThis is a modified FDA-approved test that has been validated and its performance characteristics determined by the reporting laboratory.  This laboratory is certified under the Clinical Laboratory Improvement Amendments CLIA as qualified to perform high complexity clinical laboratory testing.    * RARE ENTEROBACTER CLOACAE  Blood Culture ID Panel (Reflexed)     Status: Abnormal   Collection Time: 12/09/23  9:03 AM  Result Value Ref Range Status   Enterococcus faecalis NOT DETECTED NOT DETECTED Final   Enterococcus Faecium NOT DETECTED NOT DETECTED Final   Listeria monocytogenes NOT DETECTED NOT DETECTED Final   Staphylococcus species NOT DETECTED NOT DETECTED Final   Staphylococcus aureus (BCID) NOT DETECTED NOT DETECTED Final   Staphylococcus epidermidis NOT DETECTED NOT DETECTED Final   Staphylococcus lugdunensis NOT DETECTED NOT DETECTED Final   Streptococcus species NOT DETECTED NOT DETECTED Final   Streptococcus agalactiae NOT DETECTED NOT DETECTED Final   Streptococcus pneumoniae NOT DETECTED NOT DETECTED Final   Streptococcus pyogenes NOT DETECTED NOT DETECTED Final   A.calcoaceticus-baumannii NOT DETECTED NOT DETECTED Final   Bacteroides fragilis NOT DETECTED NOT DETECTED Final   Enterobacterales DETECTED (A) NOT DETECTED Final    Comment: Enterobacterales represent a large order of gram negative bacteria, not a single organism. Refer to culture  for further identification. CRITICAL RESULT CALLED TO, READ BACK BY AND VERIFIED WITH: CHRISTELLA MAYER RN 12/10/2023 @ 0108 BY AB    Enterobacter cloacae complex NOT DETECTED NOT DETECTED Final   Escherichia coli NOT DETECTED NOT DETECTED Final   Klebsiella aerogenes NOT DETECTED NOT DETECTED Final   Klebsiella oxytoca NOT DETECTED NOT DETECTED Final   Klebsiella pneumoniae NOT DETECTED NOT DETECTED Final   Proteus species NOT DETECTED NOT DETECTED Final  Salmonella species NOT DETECTED NOT DETECTED Final   Serratia marcescens NOT DETECTED NOT DETECTED Final   Haemophilus influenzae NOT DETECTED NOT DETECTED Final   Neisseria meningitidis NOT DETECTED NOT DETECTED Final   Pseudomonas aeruginosa NOT DETECTED NOT DETECTED Final   Stenotrophomonas maltophilia NOT DETECTED NOT DETECTED Final   Candida albicans NOT DETECTED NOT DETECTED Final   Candida auris NOT DETECTED NOT DETECTED Final   Candida glabrata NOT DETECTED NOT DETECTED Final   Candida krusei NOT DETECTED NOT DETECTED Final   Candida parapsilosis NOT DETECTED NOT DETECTED Final   Candida tropicalis NOT DETECTED NOT DETECTED Final   Cryptococcus neoformans/gattii NOT DETECTED NOT DETECTED Final   CTX-M ESBL NOT DETECTED NOT DETECTED Final   Carbapenem resistance IMP NOT DETECTED NOT DETECTED Final   Carbapenem resistance KPC NOT DETECTED NOT DETECTED Final   Carbapenem resistance NDM NOT DETECTED NOT DETECTED Final   Carbapenem resist OXA 48 LIKE NOT DETECTED NOT DETECTED Final   Carbapenem resistance VIM NOT DETECTED NOT DETECTED Final    Comment: Performed at [facility], [address], [place], [place] [address]  Surgical PCR screen     Status: None   Collection Time: 12/10/23 10:57 PM   Specimen: Nasal Mucosa; Nasal Swab  Result Value Ref Range Status   MRSA, PCR NEGATIVE NEGATIVE Final   Staphylococcus aureus NEGATIVE NEGATIVE Final    Comment: (NOTE) The Xpert SA Assay (FDA approved for NASAL specimens in patients  85 years of age and older), is one component of a comprehensive surveillance program. It is not intended to diagnose infection nor to guide or monitor treatment. Performed at [facility], [address] [place], [place] [address]   Aerobic/Anaerobic Culture w Gram Stain (surgical/deep wound)     Status: None (Preliminary result)   Collection Time: 12/11/23  1:51 PM   Specimen: Path fluid; Body Fluid  Result Value Ref Range Status   Specimen Description   Final    FLUID Performed at [facility], [address], [place], [place] [address]    Special Requests   Final    NONE Performed at [facility], [address], [place], [place] [address]    Gram Stain   Final    NO WBC SEEN NO ORGANISMS SEEN Performed at [facility], [address] [place], [place] [address]    Culture PENDING  Incomplete   Report Status PENDING  Incomplete     DG Foot Complete Left Result Date: 12/11/2023 CLINICAL DATA:  Amputation great toe EXAM: LEFT FOOT - COMPLETE 3+ VIEW COMPARISON:  12/08/2023 MRI FINDINGS: Interval amputation of the distal phalanx great toe. Central concavity of the distal articular surface of the proximal phalanx as on preoperative radiographs. Bandaging along the amputation site observed. Second through fifth hammertoe/claw toe deformities. Chronic corticated erosion along the medial margin of the head of the first metatarsal, cannot exclude gout arthropathy. Mild atheromatous vascular calcifications. Dorsal subcutaneous swelling along the distal forefoot. IMPRESSION: 1. Interval amputation of the distal phalanx of the great toe. 2. Chronic corticated erosion along the medial margin of the head of the first metatarsal, cannot exclude gout arthropathy. 3. Second through fifth hammertoe/claw toe deformities. 4. Dorsal subcutaneous swelling along the distal forefoot. Electronically Signed   By: Ryan Salvage M.D.   On: 12/11/2023 15:16    ASSESSMENT & PLAN 1. Status post partial  amputation of left hallux (great toe), post-operative day one for osteomyelitis: - Dry dressing applied to left  foot. - Dressing to remain intact until office follow-up on Tuesday, September 9 at noon. - Discontinued Dilaudid  due to confusion. - Prescribed hydrocodone /acetaminophen  7.5/325mg , one tablet every 6 hours as needed for pain. - First dose to be administered in hospital to ensure tolerance. - Patient to remain non-weight bearing on left foot while wearing surgical shoe. - May put pressure on heel only when going to restroom. - Otherwise should remain seated with foot elevated.  2. Anticoagulation: - Safe to resume Coumadin  from surgical standpoint.  3. Infection management: - To complete 2-week course of oral Levaquin  upon discharge.  4. Mobility assistance: - Provided prescription for walker and wheelchair with elevated leg rest. - Daughter will obtain from [place].  5. Disposition: - Cleared for discharge from surgical standpoint. - Follow-up appointment scheduled for Tuesday, September 9 at noon.  Morene LILLETTE Anon 12/12/2023, 11:59 AM

## 2023-12-14 ENCOUNTER — Telehealth: Payer: Self-pay | Admitting: *Deleted

## 2023-12-14 ENCOUNTER — Ambulatory Visit: Attending: Family Medicine

## 2023-12-14 LAB — CULTURE, BLOOD (ROUTINE X 2)

## 2023-12-14 NOTE — Transitions of Care (Post Inpatient/ED Visit) (Signed)
   12/14/2023  Name: Jerry Cain MRN: [id number] DOB: 1932/02/07  Today's TOC FU Call Status: Today's TOC FU Call Status:: Unsuccessful Call (1st Attempt) Unsuccessful Call (1st Attempt) Date: 12/14/23  Attempted to reach the patient regarding the most recent Inpatient/ED visit.  Follow Up Plan: Additional outreach attempts will be made to reach the patient to complete the Transitions of Care (Post Inpatient/ED visit) call.   Andrea Dimes RN, BSN [place]  [facility] RN Care Manager [phone]

## 2023-12-15 ENCOUNTER — Telehealth: Payer: Self-pay | Admitting: *Deleted

## 2023-12-15 NOTE — Transitions of Care (Post Inpatient/ED Visit) (Signed)
   12/15/2023  Name: Jerry Cain MRN: [id number] DOB: 11-24-1931  Today's TOC FU Call Status: Today's TOC FU Call Status:: Unsuccessful Call (2nd Attempt) Unsuccessful Call (2nd Attempt) Date: 12/15/23  Attempted to reach the patient regarding the most recent Inpatient/ED visit.  Follow Up Plan: Additional outreach attempts will be made to reach the patient to complete the Transitions of Care (Post Inpatient/ED visit) call.   Andrea Dimes RN, BSN [place]  [facility] RN Care Manager [phone]

## 2023-12-16 ENCOUNTER — Telehealth: Payer: Self-pay | Admitting: *Deleted

## 2023-12-16 LAB — CULTURE, BLOOD (ROUTINE X 2): Special Requests: ADEQUATE

## 2023-12-16 LAB — AEROBIC/ANAEROBIC CULTURE W GRAM STAIN (SURGICAL/DEEP WOUND): Gram Stain: NONE SEEN

## 2023-12-16 LAB — SURGICAL PATHOLOGY

## 2023-12-16 NOTE — Transitions of Care (Post Inpatient/ED Visit) (Signed)
   12/16/2023  Name: Jerry Cain MRN: [id number] DOB: 09/16/1931  Today's TOC FU Call Status: Today's TOC FU Call Status:: Unsuccessful Call (3rd Attempt) Unsuccessful Call (3rd Attempt) Date: 12/16/23  Attempted to reach the patient regarding the most recent Inpatient/ED visit.  Follow Up Plan: No further outreach attempts will be made at this time. We have been unable to contact the patient.  Andrea Dimes RN, BSN [place]  [facility] RN Care Manager [phone]

## 2023-12-24 ENCOUNTER — Telehealth: Payer: Self-pay | Admitting: Cardiovascular Disease

## 2023-12-24 NOTE — Telephone Encounter (Signed)
 error

## 2023-12-24 NOTE — Telephone Encounter (Signed)
 I am sorry to hear that he was in the hospital and had to have a toe amputation.  I am glad that he is better.  I am fully in agreement with switching from Coumadin  to Eliquis .  The transition is quite simple.  Stop Coumadin  now and start Eliquis  5 mg twice daily.  Please make him an appointment with either myself, Laymon Qua or even the A-fib clinic in the next several weeks.

## 2023-12-24 NOTE — Telephone Encounter (Signed)
 Pt c/o medication issue:  1. Name of Medication:   warfarin (COUMADIN ) 5 MG tablet    2. How are you currently taking this medication (dosage and times per day)?   TAKE (1/2) TO 1 TABLET BY MOUTH ONCE DAILY OR AS DIRECTED BY COUMADIN  CLINIC.Patient taking differently: TAKE (1/2) TO 1 TABLET BY MOUTH ONCE DAILY OR AS DIRECTED BY COUMADIN  CLINIC. Take 1 tablet On Sunday,Monday,Wednesday and Friday. Take 1/2 tablet on Tuesday,Thursday and Saturday    3. Are you having a reaction (difficulty breathing--STAT)? no  4. What is your medication issue? Pt would like a call back switching to a different medication as discussed previous with Dr C.

## 2023-12-24 NOTE — Telephone Encounter (Signed)
 I spoke with patient. He has seen Dr Francyne in the past and most recently has seen Laymon Qua, PAC.  Patient reports he currently takes coumadin  but was recently in the hospital and it was recommended he may want to switch to Eliquis .  Patient is calling to see if this change can be made.

## 2023-12-25 MED ORDER — APIXABAN 5 MG PO TABS
5.0000 mg | ORAL_TABLET | Freq: Two times a day (BID) | ORAL | 0 refills | Status: DC
Start: 2023-12-25 — End: 2024-01-13

## 2023-12-25 NOTE — Telephone Encounter (Signed)
 Pt notified of Dr. Tyrone response. Pt agrees with plan of care. Samples placed at front desk.

## 2024-01-04 ENCOUNTER — Encounter

## 2024-01-04 ENCOUNTER — Ambulatory Visit: Admitting: Urology

## 2024-01-07 ENCOUNTER — Telehealth: Payer: Self-pay

## 2024-01-07 NOTE — Telephone Encounter (Signed)
 Copied from CRM [id number]. Topic: General - Other >> Jan 07, 2024 11:35 AM Treva T wrote: Reason for CRM: Received call from patient, states he has calluses on foot, and is a diabetic.   States he was seen today by foot specialist and was advised that special shoes were needed for diabetics.   Patient is calling to inquire what shoes are needed and what insurance will cover, and how to start the process for this. Patient also reports had recent surgery in September for partial toe removal.   Patient would like a return call to discuss further, or next steps at [phone].

## 2024-01-08 ENCOUNTER — Other Ambulatory Visit: Payer: Self-pay | Admitting: Cardiovascular Disease

## 2024-01-08 DIAGNOSIS — I4821 Permanent atrial fibrillation: Secondary | ICD-10-CM

## 2024-01-08 NOTE — Telephone Encounter (Signed)
 Patients medicare will cover diabetic shoes as long as there is documentation and Rx. As long as patient has met his Part B deductible, he will only be responsible for 20% of the cost.   Usually a podiatrist or qualified provider prescribes the shoes or inserts and orders them. Is the patient sure the shoes have not been prescribed/ordered?  If not we may have to contact a medical supply store. I looked on parachute and was unable to locate a DME company that would supply them.

## 2024-01-08 NOTE — Telephone Encounter (Signed)
 Spoke with patient and informed him that he would need to speak with the foot specialist regarding getting fitted for diabetic shoes. Patient gave a verbal understanding .

## 2024-01-11 NOTE — Progress Notes (Unsigned)
 Cardiology Office Note    Date:  01/13/2024  ID:  Bhavesh, Vazquez 1931-09-07, MRN [id number] Cardiologist: Jerel Balding, MD Cardiology APP:  Johnson Laymon HERO, PA-C { : History of Present Illness:    Jerry Cain is a 89 y.o. male with past medical history of CAD (s/p PTCA and stenting of mid-LAD and LCx in 2009, NST in 03/2018 showing prior infarct with no current ischemia, NST in 06/2022 showing no ischemia and a low-risk study), persistent atrial fibrillation (on Coumadin ), trifasicular block, chronic HFpEF, HTN and HLD who presents to the office today for hospital follow-up.   He was last examined by myself in 10/2023 and denied any specific anginal symptoms. Activity was more limited given an ulcer along his left foot and he was being followed by the wound clinic. No changes were made to his cardiac medications and he was continued on Coreg  25 mg twice daily, Imdur  60 mg daily, Crestor  10 mg daily, Lasix  20 mg daily, Lisinopril  20 mg daily and Coumadin . He was not on an SGLT2 inhibitor given his nonhealing wound.  In the interim, he was admitted to [facility] in 12/2023 for sepsis in the setting of a diabetic foot ulcer. Blood cultures were positive for Enterobacter and Klebsiella. Was found to have osteomyelitis and underwent partial amputation of his right great toe on 12/11/2023 by Podiatry. He contacted the office after his hospitalization and wished to switch from Coumadin  to Eliquis . Dr. Balding prescribed Eliquis  5 mg twice daily on 12/24/2023.  In talking with the patient today, he reports overall doing well since his recent surgery. Says that his foot has overall felt well and he has not had recurrent lower extremity edema. His biggest issue has been worsening right knee pain and he has upcoming follow-up with orthopedics to further review this. Has known arthritis but previously declined surgery. He denies any recent chest pain or progressive dyspnea on exertion. No  specific orthopnea or PND. Does have occasional palpitations but no persistent symptoms. He did switch to Eliquis  and denies any melena, hematochezia or hematuria.  Studies Reviewed:   EKG: EKG is not ordered today.  Echocardiogram: 06/2022 Study Conclusions   - Left ventricle: The cavity size was normal. Wall thickness was    increased in a pattern of moderate LVH. Systolic function was    normal. The estimated ejection fraction was in the range of 60%    to 65%. Wall motion was normal; there were no regional wall    motion abnormalities. The study is not technically sufficient to    allow evaluation of LV diastolic function.  - Aortic valve: Moderately calcified annulus. Trileaflet;    moderately thickened leaflets. Valve area (VTI): 1.79 cm^2. Valve    area (Vmax): 1.79 cm^2. Valve area (Vmean): 1.76 cm^2.  - Mitral valve: Mildly calcified annulus. Mildly thickened leaflets    . There was mild regurgitation.  - Left atrium: The atrium was moderately dilated.  - Right atrium: The atrium was moderately dilated.    NST: 06/2022   Lexiscan  stress showed no EKG changes to suggest ischemia.   Myoview  scan showed thinning with decreased tracer counts in the basal inferior, distal inferior regions and apex. Consistent with probable soft tissue attenuation (diaphragm) and normal perfusion. No ischemia or scar   LVEF 57% with normal wall motion   Overall low risk scan   Risk Assessment/Calculations:    CHA2DS2-VASc Score = 4   This indicates a 4.8% annual risk of  stroke. The patient's score is based upon: CHF History: 0 HTN History: 1 Diabetes History: 0 Stroke History: 0 Vascular Disease History: 1 Age Score: 2 Gender Score: 0    Physical Exam:   VS:  BP 122/68   Pulse 94   Ht 5' 10 (1.778 m)   Wt 190 lb (86.2 kg)   SpO2 96%   BMI 27.26 kg/m    Wt Readings from Last 3 Encounters:  01/13/24 190 lb (86.2 kg)  12/12/23 183 lb 4.8 oz (83.1 kg)  10/08/23 188 lb (85.3  kg)     GEN: Pleasant, elderly male appearing in no acute distress NECK: No JVD; No carotid bruits CARDIAC: Irregular irregular, 2/6 SEM along RUSB.  RESPIRATORY:  Clear to auscultation without rales, wheezing or rhonchi  ABDOMEN: Appears non-distended. No obvious abdominal masses. EXTREMITIES: No clubbing or cyanosis. No pitting edema.  Distal pedal pulses are 2+ bilaterally.   Assessment and Plan:   1. Coronary artery disease involving native coronary artery of native heart without angina pectoris - Most recent intervention was in 2009 and NST in 06/2022 showed no evidence of ischemia  He denies any specific anginal symptoms.  - Continue current medical therapy with Coreg  25 mg twice daily, Imdur  60 mg daily and Crestor  10 mg daily. He is not on ASA given the need for anticoagulation.  2. Chronic heart failure with preserved ejection fraction (HCC) - Echocardiogram in 2024 showed a preserved EF of 60 to 65%. He denies any orthopnea, PND or pitting edema and appears euvolemic by examination today. Continue Lasix  20 mg daily. Creatinine was stable at 0.78 when checked on 12/12/2023.  3. Permanent atrial fibrillation (HCC) - He reports occasional, brief palpitations but no persistent symptoms. Heart rate has overall been well-controlled when checked at home. Continue Coreg  25 mg twice daily for rate-control. - He has now switched to Eliquis  5 mg twice daily for anticoagulation which is the appropriate dose given his current age, weight and renal function (creatinine at 0.78 when checked in 12/2023).  4. Essential hypertension - BP was initially elevated at 148/92 but he had just taken his medications prior to driving to his visit. Rechecked and improved to 122/68 and he reports this has been well-controlled when checked in the ambulatory setting. Continue current medical therapy with Coreg  25 mg twice daily, Imdur  60mg  daily and Lisinopril  20 mg daily.  5. Hyperlipidemia LDL goal <70 - LDL  was at 48 when checked in 2024 and we have requested most recent labs from his PCP. Continue current medical therapy with Crestor  10 mg daily for now.   Signed, Laymon CHRISTELLA Qua, PA-C

## 2024-01-13 ENCOUNTER — Ambulatory Visit: Attending: Student | Admitting: Student

## 2024-01-13 ENCOUNTER — Encounter: Payer: Self-pay | Admitting: Student

## 2024-01-13 VITALS — BP 122/68 | HR 94 | Ht 70.0 in | Wt 190.0 lb

## 2024-01-13 DIAGNOSIS — I2510 Atherosclerotic heart disease of native coronary artery without angina pectoris: Secondary | ICD-10-CM | POA: Diagnosis not present

## 2024-01-13 DIAGNOSIS — I4821 Permanent atrial fibrillation: Secondary | ICD-10-CM

## 2024-01-13 DIAGNOSIS — I10 Essential (primary) hypertension: Secondary | ICD-10-CM | POA: Diagnosis not present

## 2024-01-13 DIAGNOSIS — I5032 Chronic diastolic (congestive) heart failure: Secondary | ICD-10-CM

## 2024-01-13 DIAGNOSIS — E785 Hyperlipidemia, unspecified: Secondary | ICD-10-CM

## 2024-01-13 MED ORDER — APIXABAN 5 MG PO TABS: 5.0000 mg | ORAL_TABLET | Freq: Two times a day (BID) | ORAL | 5 refills | Status: AC

## 2024-01-13 NOTE — Patient Instructions (Signed)
 Medication Instructions:  Your physician recommends that you continue on your current medications as directed. Please refer to the Current Medication list given to you today.  *If you need a refill on your cardiac medications before your next appointment, please call your pharmacy*  Lab Work: NONE   If you have labs (blood work) drawn today and your tests are completely normal, you will receive your results only by: MyChart Message (if you have MyChart) OR A paper copy in the mail If you have any lab test that is abnormal or we need to change your treatment, we will call you to review the results.  Testing/Procedures: NONE   Follow-Up: At [facility], you and your health needs are our priority.  As part of our continuing mission to provide you with exceptional heart care, our providers are all part of one team.  This team includes your primary Cardiologist (physician) and Advanced Practice Providers or APPs (Physician Assistants and Nurse Practitioners) who all work together to provide you with the care you need, when you need it.  Your next appointment:   6 month(s)  Provider:   Laymon Qua, PA-C    We recommend signing up for the patient portal called MyChart.  Sign up information is provided on this After Visit Summary.  MyChart is used to connect with patients for Virtual Visits (Telemedicine).  Patients are able to view lab/test results, encounter notes, upcoming appointments, etc.  Non-urgent messages can be sent to your provider as well.   To learn more about what you can do with MyChart, go to [email or web address].   Other Instructions Thank you for choosing [facility]!

## 2024-01-28 ENCOUNTER — Ambulatory Visit: Admitting: Family Medicine

## 2024-01-28 ENCOUNTER — Encounter: Payer: Self-pay | Admitting: Family Medicine

## 2024-01-28 ENCOUNTER — Ambulatory Visit (INDEPENDENT_AMBULATORY_CARE_PROVIDER_SITE_OTHER): Admitting: Family Medicine

## 2024-01-28 VITALS — BP 100/50 | HR 81 | Temp 97.9°F | Ht 70.0 in | Wt 179.0 lb

## 2024-01-28 DIAGNOSIS — R7303 Prediabetes: Secondary | ICD-10-CM | POA: Insufficient documentation

## 2024-01-28 DIAGNOSIS — J392 Other diseases of pharynx: Secondary | ICD-10-CM | POA: Insufficient documentation

## 2024-01-28 DIAGNOSIS — Z23 Encounter for immunization: Secondary | ICD-10-CM

## 2024-01-28 DIAGNOSIS — E039 Hypothyroidism, unspecified: Secondary | ICD-10-CM | POA: Diagnosis not present

## 2024-01-28 MED ORDER — LEVOTHYROXINE SODIUM 175 MCG PO TABS: 175.0000 ug | ORAL_TABLET | Freq: Every day | ORAL | 0 refills | Status: DC

## 2024-01-28 NOTE — Assessment & Plan Note (Signed)
 Not currently taking medication for pre-diabetes. 09/28/23 A1C 6.0 Endorses waking up to drink water, wonders if this is related to getting nebulizer treatments while in hospital. 12/12/23: BMP: GFR>60  A1C today.

## 2024-01-28 NOTE — Progress Notes (Signed)
 Established Patient Office Visit  Subjective   [name], male    DOB: Feb 16, 1932  Age: 89 y.o. MRN: [id number]  Chief Complaint  Patient presents with   Follow-up    Had partial toe removed due to infection.Left toe. Having issues with his throat/dry and raspy Want flu shot today    Hypothyroid: levothyroxine  175 mcg daily 06/23/23: Free T4: 1.67  Pre-diabetes: diet controlled.  09/28/23: A1C 6.0 12/08/23: TSH normal  Received nebulizer when in the hospital. Now has dry mouth, drinks during the night. This has occurred every night since being in the hosptial. Denies being intubated with toe amputation Symptoms have improved some.  Wants to wait for ENT to see if symptoms resolve.    Cardiology for  has stents. A-fib, chronic HF Taking carvedilol  25 mg BID, lasix  20 mg daily, imdur  60 mg daily, lisinopril  20 mg daily, NTG , rosuvastatin  10 mg daily, Eliquis   Blood pressure on low side today. Asymptomatic. Follow-up with cardiology as scheduled.         ROS    Objective:     BP (!) 100/50 (BP Location: Left Arm, Patient Position: Sitting, Cuff Size: Normal)   Pulse 81   Temp 97.9 F (36.6 C) (Oral)   Ht 5' 10 (1.778 m)   Wt 179 lb (81.2 kg)   SpO2 98%   BMI 25.68 kg/m  BP Readings from Last 3 Encounters:  01/28/24 (!) 100/50  01/13/24 122/68  12/12/23 119/63      Physical Exam Vitals and nursing note reviewed.  Constitutional:      General: He is not in acute distress.    Appearance: Normal appearance.  Cardiovascular:     Rate and Rhythm: Regular rhythm.     Heart sounds: Normal heart sounds.  Pulmonary:     Effort: Pulmonary effort is normal.     Breath sounds: Normal breath sounds.  Skin:    General: Skin is warm and dry.  Neurological:     General: No focal deficit present.     Mental Status: He is alert. Mental status is at baseline.  Psychiatric:        Mood and Affect: Mood normal.        Behavior: Behavior  normal.        Thought Content: Thought content normal.        Judgment: Judgment normal.      No results found for any visits on 01/28/24.    The ASCVD Risk score (Arnett DK, et al., 2019) failed to calculate for the following reasons:   The 2019 ASCVD risk score is only valid for ages 31 to 9    Assessment & Plan:   Problem List Items Addressed This Visit     Encounter for administration of vaccine   Relevant Orders   Flu vaccine HIGH DOSE PF(Fluzone Trivalent) (Completed)   Acquired hypothyroidism - Primary   Taking levothyroxine  175 mcg daily as prescribed. Last TSH on 12/08/23: normal. Refill sent.       Relevant Medications   levothyroxine  (SYNTHROID ) 175 MCG tablet   Prediabetes   Not currently taking medication for pre-diabetes. 09/28/23 A1C 6.0 Endorses waking up to drink water, wonders if this is related to getting nebulizer treatments while in hospital. 12/12/23: BMP: GFR>60  A1C today.        Relevant Orders   Hemoglobin A1c   Dry throat   Received nebulizer when in the hospital. Now has dry mouth, drinks  during the night. This has occurred every night since being in the hosptial. Denies being intubated with toe amputation. Anesthesia note reviewed to confirm that he was not intubated during toe amputation.  Symptoms have improved some.  Wants to wait for ENT to see if symptoms resolve.  He will notify provider if referral is desired.       Agrees with plan of care discussed.  Questions answered.   Return in 3 months (on 04/29/2024) for Hypothyroid.    Darice JONELLE Brownie, FNP

## 2024-01-28 NOTE — Assessment & Plan Note (Signed)
 Taking levothyroxine  175 mcg daily as prescribed. Last TSH on 12/08/23: normal. Refill sent.

## 2024-01-28 NOTE — Assessment & Plan Note (Signed)
 Received nebulizer when in the hospital. Now has dry mouth, drinks during the night. This has occurred every night since being in the hosptial. Denies being intubated with toe amputation. Anesthesia note reviewed to confirm that he was not intubated during toe amputation.  Symptoms have improved some.  Wants to wait for ENT to see if symptoms resolve.  He will notify provider if referral is desired.

## 2024-01-29 ENCOUNTER — Ambulatory Visit: Payer: Self-pay | Admitting: Family Medicine

## 2024-01-29 LAB — HEMOGLOBIN A1C
Est. average glucose Bld gHb Est-mCnc: 117 mg/dL
Hgb A1c MFr Bld: 5.7 % — ABNORMAL HIGH (ref 4.8–5.6)

## 2024-02-09 ENCOUNTER — Telehealth: Payer: Self-pay | Admitting: Family Medicine

## 2024-02-09 NOTE — Telephone Encounter (Signed)
 Copied from CRM #[id number]. Topic: Clinical - Prescription Issue >> Feb 09, 2024 11:16 AM Willma SAUNDERS wrote: Reason for CRM: Patient states the pharmacy advised they do not have the prescription that was called in, levothyroxine  (SYNTHROID ) 175 MCG tablet. Is requesting that it be resent: [facility] [place], [place] - [id number] [place] [address] Phone: [phone] Fax: [phone]  Patient can be reached at [phone]

## 2024-04-26 ENCOUNTER — Ambulatory Visit: Admitting: Family Medicine

## 2024-04-26 ENCOUNTER — Encounter: Payer: Self-pay | Admitting: Family Medicine

## 2024-04-26 VITALS — BP 134/65 | HR 80 | Temp 98.1°F | Ht 70.0 in | Wt 186.0 lb

## 2024-04-26 DIAGNOSIS — I4821 Permanent atrial fibrillation: Secondary | ICD-10-CM | POA: Diagnosis not present

## 2024-04-26 DIAGNOSIS — I5032 Chronic diastolic (congestive) heart failure: Secondary | ICD-10-CM

## 2024-04-26 DIAGNOSIS — E039 Hypothyroidism, unspecified: Secondary | ICD-10-CM | POA: Diagnosis not present

## 2024-04-26 NOTE — Progress Notes (Signed)
" ° °  Established Patient Office Visit  Subjective   [name], male    DOB: 1931/07/09  Age: 89 y.o. MRN: [id number]  Chief Complaint  Patient presents with   Medical Management of Chronic Issues    Hypothyroidism    HPI  Hypothyroidism Pt taking Synthroid  175 mcg daily. Doing well with regimen. Time for labs today.   Pt reports going to hospital in Sept due to sepsis from infected toe on 12/08/23. He was started on antibiotics and had amputation of half of the left great toe on 12/11/23. He no longer has to see wound care. Pt with hx of prediabetes, last A1c at 5.7 in October 2025.   Pt has hx of CHF and Afib. Established with cardiology. Taking medicines as directed. Last OV was October 2025.   Review of Systems  All other systems reviewed and are negative.     Objective:     BP 134/65 (BP Location: Left Arm, Patient Position: Sitting, Cuff Size: Normal)   Pulse 80   Temp 98.1 F (36.7 C) (Oral)   Ht 5' 10 (1.778 m)   Wt 186 lb (84.4 kg)   SpO2 95%   BMI 26.69 kg/m    Physical Exam Vitals and nursing note reviewed.  Constitutional:      Appearance: Normal appearance. He is normal weight.  HENT:     Head: Normocephalic and atraumatic.     Right Ear: External ear normal.     Left Ear: External ear normal.     Nose: Nose normal.     Mouth/Throat:     Mouth: Mucous membranes are moist.     Pharynx: Oropharynx is clear.  Eyes:     Conjunctiva/sclera: Conjunctivae normal.     Pupils: Pupils are equal, round, and reactive to light.  Cardiovascular:     Rate and Rhythm: Normal rate.  Pulmonary:     Effort: Pulmonary effort is normal.  Skin:    General: Skin is warm.     Capillary Refill: Capillary refill takes less than 2 seconds.  Neurological:     General: No focal deficit present.     Mental Status: He is alert and oriented to person, place, and time. Mental status is at baseline.  Psychiatric:        Mood and Affect: Mood normal.         Behavior: Behavior normal.        Thought Content: Thought content normal.        Judgment: Judgment normal.     No results found for any visits on 04/26/24.    The ASCVD Risk score (Arnett DK, et al., 2019) failed to calculate for the following reasons:   The 2019 ASCVD risk score is only valid for ages 24 to 27   * - Cholesterol units were assumed    Assessment & Plan:   Problem List Items Addressed This Visit       Endocrine   Acquired hypothyroidism - Primary   Relevant Orders   T4, free   TSH   Acquired hypothyroidism -     T4, free -     TSH  Chronic heart failure with preserved ejection fraction (HCC)  Permanent atrial fibrillation (HCC)   Pt with hypothyroidism. To check tsh/t4. Pt with chronic CHF and A fib. Continue follow up with cardiology. See in 6 months for AWV.  No follow-ups on file.    Torrence CINDERELLA Barrier, MD  "

## 2024-04-27 ENCOUNTER — Ambulatory Visit: Payer: Self-pay | Admitting: Family Medicine

## 2024-04-27 LAB — T4, FREE: Free T4: 1.61 ng/dL (ref 0.82–1.77)

## 2024-04-27 LAB — TSH: TSH: 2.39 u[IU]/mL (ref 0.450–4.500)

## 2024-04-29 ENCOUNTER — Ambulatory Visit: Admitting: Family Medicine

## 2024-05-03 ENCOUNTER — Ambulatory Visit: Admitting: Family Medicine

## 2024-05-09 ENCOUNTER — Ambulatory Visit: Admitting: Urology

## 2024-05-11 ENCOUNTER — Other Ambulatory Visit: Payer: Self-pay

## 2024-05-11 DIAGNOSIS — E039 Hypothyroidism, unspecified: Secondary | ICD-10-CM

## 2024-05-11 MED ORDER — LEVOTHYROXINE SODIUM 175 MCG PO TABS: 175.0000 ug | ORAL_TABLET | Freq: Every day | ORAL | 1 refills | Status: AC

## 2024-07-11 ENCOUNTER — Ambulatory Visit: Admitting: Urology

## 2024-10-24 ENCOUNTER — Ambulatory Visit: Admitting: Family Medicine
